# Patient Record
Sex: Female | Born: 1950 | Race: Black or African American | Hispanic: No | Marital: Single | Smoking: Former smoker
Health system: Southern US, Community
[De-identification: ages and names within clinical notes are randomized; demographics above are authoritative.]

## PROBLEM LIST (undated history)

## (undated) DIAGNOSIS — C541 Malignant neoplasm of endometrium: Secondary | ICD-10-CM

## (undated) DIAGNOSIS — C539 Malignant neoplasm of cervix uteri, unspecified: Secondary | ICD-10-CM

## (undated) DIAGNOSIS — Z8679 Personal history of other diseases of the circulatory system: Secondary | ICD-10-CM

## (undated) DIAGNOSIS — F32A Depression, unspecified: Secondary | ICD-10-CM

## (undated) DIAGNOSIS — I10 Essential (primary) hypertension: Secondary | ICD-10-CM

## (undated) DIAGNOSIS — Z9289 Personal history of other medical treatment: Secondary | ICD-10-CM

## (undated) DIAGNOSIS — E785 Hyperlipidemia, unspecified: Secondary | ICD-10-CM

## (undated) DIAGNOSIS — M1990 Unspecified osteoarthritis, unspecified site: Secondary | ICD-10-CM

## (undated) DIAGNOSIS — R569 Unspecified convulsions: Secondary | ICD-10-CM

## (undated) DIAGNOSIS — E119 Type 2 diabetes mellitus without complications: Secondary | ICD-10-CM

## (undated) DIAGNOSIS — F329 Major depressive disorder, single episode, unspecified: Secondary | ICD-10-CM

## (undated) DIAGNOSIS — K219 Gastro-esophageal reflux disease without esophagitis: Secondary | ICD-10-CM

## (undated) DIAGNOSIS — R011 Cardiac murmur, unspecified: Secondary | ICD-10-CM

## (undated) DIAGNOSIS — D649 Anemia, unspecified: Secondary | ICD-10-CM

## (undated) DIAGNOSIS — Z9889 Other specified postprocedural states: Secondary | ICD-10-CM

## (undated) DIAGNOSIS — Z87898 Personal history of other specified conditions: Secondary | ICD-10-CM

## (undated) DIAGNOSIS — Z923 Personal history of irradiation: Secondary | ICD-10-CM

## (undated) DIAGNOSIS — C181 Malignant neoplasm of appendix: Secondary | ICD-10-CM

## (undated) DIAGNOSIS — N950 Postmenopausal bleeding: Secondary | ICD-10-CM

## (undated) DIAGNOSIS — R911 Solitary pulmonary nodule: Secondary | ICD-10-CM

## (undated) HISTORY — DX: Essential (primary) hypertension: I10

## (undated) HISTORY — DX: Personal history of irradiation: Z92.3

## (undated) HISTORY — DX: Depression, unspecified: F32.A

## (undated) HISTORY — DX: Major depressive disorder, single episode, unspecified: F32.9

## (undated) HISTORY — PX: COLONOSCOPY: SHX174

## (undated) HISTORY — DX: Malignant neoplasm of appendix: C18.1

## (undated) HISTORY — DX: Solitary pulmonary nodule: R91.1

## (undated) HISTORY — DX: Malignant neoplasm of cervix uteri, unspecified: C53.9

---

## 1995-12-05 HISTORY — PX: CRANIOTOMY POSTERIOR FOSSA: SUR344

## 2015-05-05 HISTORY — PX: OTHER SURGICAL HISTORY: SHX169

## 2015-05-24 ENCOUNTER — Ambulatory Visit: Payer: Medicare Other | Attending: Gynecologic Oncology | Admitting: Gynecologic Oncology

## 2015-05-24 ENCOUNTER — Encounter: Payer: Self-pay | Admitting: Gynecologic Oncology

## 2015-05-24 VITALS — BP 148/79 | HR 53 | Temp 97.8°F | Resp 16 | Ht 68.0 in | Wt 203.4 lb

## 2015-05-24 DIAGNOSIS — C541 Malignant neoplasm of endometrium: Secondary | ICD-10-CM | POA: Diagnosis present

## 2015-05-24 DIAGNOSIS — C530 Malignant neoplasm of endocervix: Secondary | ICD-10-CM | POA: Diagnosis not present

## 2015-05-24 DIAGNOSIS — C539 Malignant neoplasm of cervix uteri, unspecified: Secondary | ICD-10-CM | POA: Insufficient documentation

## 2015-05-24 HISTORY — DX: Malignant neoplasm of cervix uteri, unspecified: C53.9

## 2015-05-24 NOTE — Progress Notes (Signed)
Consult Note: Gyn-Onc  Consult was requested by Dr. Adah Perl for the evaluation of Lori [name] 64 y.o. female  CC:  Chief Complaint  Patient presents with  . endometrial cancer    New consult    Assessment/Plan:  [name]  is a 64 y.o.  year old with clinical stage IIB endocervical adenocarcinoma. She has left parametrial infiltration on examination and an eroded cervix.   I am recommending primary chemoradiation with external beam radiation, brachytherapy and radiosensitizing cisplatin chemotherapy. The patient is interested in being treated in [place], and we will facilitate these appointments.  We will schedule her for PET/CT imaging to evaluate for distant metastatic disease which would alter treatment goals and choices.   HPI: Lori [name] is a 64 year old woman is seen in consultation at the request of Dr. Adah Perl for postmenopausal bleeding and adenocarcinoma.   The patient reports having postmenopausal bleeding sent March 2016. She was evaluated with a Pap smear in April 2016 which revealed ASC-H. she then presented for colposcopic evaluation at which time an abnormal cervix was visualized. An endometrial biopsy was taken and this revealed adenocarcinoma with special stains consistent with an endocervical primary (CEA and P 16 positivity). She underwent a CT scan of the abdomen and pelvis on 04/09/2015 and this revealed no lymphadenopathy, mild low attenuation thickening of the endometrium, no gross metastatic disease.   Current Meds:  Outpatient Encounter Prescriptions as of 05/24/2015  Medication Sig  . amLODipine (NORVASC) 10 MG tablet Take 10 mg by mouth daily.   Marland Kitchen aspirin 81 MG tablet Take 81 mg by mouth daily.  . benazepril (LOTENSIN) 40 MG tablet Take 40 mg by mouth daily.   . carbamazepine (TEGRETOL) 100 MG chewable tablet Chew 100 mg by mouth 3 (three) times daily.   . cloNIDine (CATAPRES) 0.2 MG tablet Take 0.2 mg by mouth 3 (three) times daily.    Marland Kitchen gabapentin (NEURONTIN) 300 MG capsule Take 300 mg by mouth 2 (two) times daily.   . hydrochlorothiazide (HYDRODIURIL) 25 MG tablet Take 25 mg by mouth daily.   . metoprolol (LOPRESSOR) 50 MG tablet 1.5 tablets by mouth in the morning, 1 tablet in the evening  . potassium chloride SA (K-DUR,KLOR-CON) 20 MEQ tablet Take 20 mEq by mouth daily.   . pravastatin (PRAVACHOL) 20 MG tablet Take 20 mg by mouth daily.   Marland Kitchen ibuprofen (ADVIL,MOTRIN) 400 MG tablet    No facility-administered encounter medications on file as of 05/24/2015.    Allergy:  Allergies  Allergen Reactions  . Contrast Media [Iodinated Diagnostic Agents] Itching and Swelling    All over  . Dilantin [Phenytoin Sodium Extended] Itching and Swelling    Whole body  . Latex Hives and Itching    Social Hx:   History   Social History  . Marital Status: Single    Spouse Name: N/A  . Number of Children: N/A  . Years of Education: N/A   Occupational History  . Not on file.   Social History Main Topics  . Smoking status: Former Smoker    Quit date: 12/05/1995  . Smokeless tobacco: Not on file  . Alcohol Use: No  . Drug Use: No  . Sexual Activity: Not on file   Other Topics Concern  . Not on file   Social History Narrative  . No narrative on file    Past Surgical Hx:  Past Surgical History  Procedure Laterality Date  . Brain surgery  1997    for aneurysum  Past Medical Hx:  Past Medical History  Diagnosis Date  . Depression   . Diabetes mellitus without complication   . Hypertension   . Elevated cholesterol     Past Gynecological History:  SVD x 3  No LMP recorded.  Family Hx:  Family History  Problem Relation Age of Onset  . Throat cancer Father   . Cervical cancer Sister     Review of Systems:  Constitutional  Feels well,    ENT Normal appearing ears and nares bilaterally Skin/Breast  No rash, sores, jaundice, itching, dryness Cardiovascular  No chest pain, shortness of breath, or  edema  Pulmonary  No cough or wheeze.  Gastro Intestinal  No nausea, vomitting, or diarrhoea. No bright red blood per rectum, no abdominal pain, change in bowel movement, or constipation.  Genito Urinary  No frequency, urgency, dysuria, see HPI Musculo Skeletal  No myalgia, arthralgia, joint swelling or pain  Neurologic  No weakness, numbness, change in gait,  Psychology  No depression, anxiety, insomnia.   Vitals:  Blood pressure 148/79, pulse 53, temperature 97.8 F (36.6 C), temperature source Oral, resp. rate 16, height 5\' 8"  (1.727 m), weight 203 lb 6.4 oz (92.262 kg).  Physical Exam: WD in NAD Neck  Supple NROM, without any enlargements.  Lymph Node Survey No cervical supraclavicular or inguinal adenopathy Cardiovascular  Pulse normal rate, regularity and rhythm. S1 and S2 normal.  Lungs  Clear to auscultation bilateraly, without wheezes/crackles/rhonchi. Good air movement.  Skin  No rash/lesions/breakdown  Psychiatry  Alert and oriented to person, place, and time  Abdomen  Normoactive bowel sounds, abdomen soft, non-tender and overweight without evidence of hernia.  Back No CVA tenderness Genito Urinary  Vulva/vagina: Normal external female genitalia.  No lesions. No discharge or bleeding.  Bladder/urethra:  No lesions or masses, well supported bladder  Vagina: Grossly normal with crater at apex in place of cervix  Cervix: No normal cervical tissue remaining. A crater replaces the cervix. It contains a base of white, firm, fleshy and friable tissue. On rectovaginal exam there is left parametrial infiltration.  Uterus: Small, mobile, no parametrial involvement or nodularity.  Adnexa: no discrete masses. Rectal  Good tone, no masses no cul de sac nodularity.  Extremities  No bilateral cyanosis, clubbing or edema.   Lori Eva, MD   05/24/2015, 3:38 PM

## 2015-05-24 NOTE — Patient Instructions (Addendum)
Plan for radiation with weekly Cisplatin at [facility] in [place]. We are going to send a referral and you will receive a phone call with these appointments.   PET scan scheduled for 06/01/15 at [facility]. If you have any additional questions or concerns, please call our office.

## 2015-06-01 ENCOUNTER — Telehealth: Payer: Self-pay | Admitting: *Deleted

## 2015-06-01 ENCOUNTER — Ambulatory Visit (HOSPITAL_COMMUNITY)
Admission: RE | Admit: 2015-06-01 | Discharge: 2015-06-01 | Disposition: A | Discharge status: Home / Self Care | Payer: Medicare Other | Source: Ambulatory Visit | Attending: Gynecologic Oncology | Admitting: Gynecologic Oncology

## 2015-06-01 DIAGNOSIS — E119 Type 2 diabetes mellitus without complications: Secondary | ICD-10-CM | POA: Insufficient documentation

## 2015-06-01 DIAGNOSIS — I10 Essential (primary) hypertension: Secondary | ICD-10-CM | POA: Diagnosis not present

## 2015-06-01 DIAGNOSIS — R933 Abnormal findings on diagnostic imaging of other parts of digestive tract: Secondary | ICD-10-CM | POA: Diagnosis not present

## 2015-06-01 DIAGNOSIS — C541 Malignant neoplasm of endometrium: Secondary | ICD-10-CM | POA: Diagnosis present

## 2015-06-01 DIAGNOSIS — C775 Secondary and unspecified malignant neoplasm of intrapelvic lymph nodes: Secondary | ICD-10-CM | POA: Diagnosis not present

## 2015-06-01 LAB — GLUCOSE, CAPILLARY: Glucose-Capillary: 105 mg/dL — ABNORMAL HIGH (ref 65–99)

## 2015-06-01 MED ORDER — FLUDEOXYGLUCOSE F - 18 (FDG) INJECTION
10.9000 | Freq: Once | INTRAVENOUS | Status: AC | PRN
  Administered 2015-06-01: 10.9 via INTRAVENOUS

## 2015-06-01 NOTE — Telephone Encounter (Signed)
Call received from [facility] at [place] stating  the  patient has a scheduled appointment on June 14, 2015 @ 3:30 with Dr. Lexine Baton.

## 2015-07-21 ENCOUNTER — Other Ambulatory Visit: Payer: Self-pay | Admitting: Radiation Oncology

## 2015-07-21 DIAGNOSIS — C539 Malignant neoplasm of cervix uteri, unspecified: Secondary | ICD-10-CM

## 2015-07-22 ENCOUNTER — Encounter (HOSPITAL_BASED_OUTPATIENT_CLINIC_OR_DEPARTMENT_OTHER): Payer: Self-pay | Admitting: *Deleted

## 2015-07-23 ENCOUNTER — Telehealth: Payer: Self-pay | Admitting: *Deleted

## 2015-07-23 NOTE — Telephone Encounter (Signed)
CALLED PATIENT TO INFORM OF TANDEM RING PLACEMENT ON 07-28-15, LVM FOR A RETURN CALL

## 2015-07-27 ENCOUNTER — Encounter (HOSPITAL_BASED_OUTPATIENT_CLINIC_OR_DEPARTMENT_OTHER): Payer: Self-pay | Admitting: *Deleted

## 2015-07-27 NOTE — H&P (Signed)
Radiation Oncology         [phone] ________________________________  History and physical examination note  Name: Lori [name] MRN: [id number]  Date: 07/21/2015  DOB: 1951-03-23  AS:TMHD,QQIWLN, MD  No ref. provider found   REFERRING PHYSICIAN: Everitt Amber, MD  DIAGNOSIS: clinical stage IIB endocervical adenocarcinoma.  HISTORY OF PRESENT ILLNESS::Lori [name] is a 64 y.o. female who is  diagnosed with clinical stage II-B endocervical adenocarcinoma. She has been found to have left parametrial filtration on examination and an eroded cervix. Patient is been receiving radiosensitizing chemotherapy along with pelvic radiation therapy in the [place]. Patient has completed approximately 25 treatments as of yesterday. She will be taken to the operating room on August 24 for exam under anesthesia and placement of cervical sleeve under the direction of Dr. Denman George as well as placement of tandem and ring for high-dose rate radiation therapy treatments.   PAST MEDICAL HISTORY:  has a past medical history of Depression; Hypertension; Hyperlipidemia; History of cerebral aneurysm repair; PMB (postmenopausal bleeding); History of seizures; Type 2 diabetes, diet controlled; GERD (gastroesophageal reflux disease); Arthritis; Endometrial carcinoma; and Cervical carcinoma (oncologist--  dr Sondra Come for radiation/   [facility] for chemotherapy).    PAST SURGICAL HISTORY: Past Surgical History  Procedure Laterality Date  . Craniotomy posterior fossa  1997    Repair aneurysm  x2  left side  . Port-a-cath placement  06/  2016    FAMILY HISTORY: family history includes Cervical cancer in her sister; Throat cancer in her father.  SOCIAL HISTORY:  reports that she quit smoking about 19 years ago. Her smoking use included Cigarettes. She quit after 5 years of use. She has never used smokeless tobacco. She reports that she does not drink alcohol or use illicit drugs.  ALLERGIES:  Contrast media; Dilantin; Latex; and Adhesive  MEDICATIONS:  No current facility-administered medications for this encounter.   Current Outpatient Prescriptions  Medication Sig Dispense Refill  . amLODipine (NORVASC) 10 MG tablet Take 10 mg by mouth every morning.   5  . aspirin 81 MG tablet Take 81 mg by mouth daily.    . benazepril (LOTENSIN) 40 MG tablet Take 40 mg by mouth every morning.   5  . carbamazepine (TEGRETOL) 100 MG chewable tablet Chew 100 mg by mouth 3 (three) times daily.   5  . cloNIDine (CATAPRES) 0.2 MG tablet Take 0.2 mg by mouth 3 (three) times daily.   6  . gabapentin (NEURONTIN) 300 MG capsule Take 300 mg by mouth 2 (two) times daily.   5  . hydrochlorothiazide (HYDRODIURIL) 25 MG tablet Take 25 mg by mouth every morning.   11  . ibuprofen (ADVIL,MOTRIN) 400 MG tablet Take 400 mg by mouth every 6 (six) hours as needed.   0  . loperamide (IMODIUM A-D) 2 MG tablet Take 2 mg by mouth 4 (four) times daily as needed for diarrhea or loose stools.    . metoprolol (LOPRESSOR) 50 MG tablet Take by mouth 2 (two) times daily. 1.5 tablets by mouth in the morning, 1 tablet in the evening  5  . potassium chloride SA (K-DUR,KLOR-CON) 20 MEQ tablet Take 20 mEq by mouth 2 (two) times daily.   6  . pravastatin (PRAVACHOL) 20 MG tablet Take 20 mg by mouth every evening.   2    REVIEW OF SYSTEMS:  A 15 point review of systems is documented in the electronic medical record. This was obtained by the nursing staff. However, I  reviewed this with the patient to discuss relevant findings and make appropriate changes.  Patient has had problems with diarrhea with her external beam treatments. This is managed well with Imodium she denies any nausea. Has some vaginal discharge but no active bleeding at this time.   PHYSICAL EXAM:  height is 5\' 8"  (1.727 m) and weight is 197 lb (89.359 kg).  this is a very pleasant 64 year old female in no acute distress. She has left-sided weakness from prior  complications related to cerebral aneurysm vital signs at the [facility] on August 22 showed a temperature of 98.1 pulse of 76 respirations 18 blood pressure 141/68 oxygen saturation 98% on room air. Examination of the oral cavity reveals no secondary infection. No palpable adenopathy in the neck or supraclavicular region. The lungs are clear to auscultation. The heart has a regular rhythm and rate. The abdomen is soft and nontender with normal bowel sounds. Pelvic exam is deferred until intraoperative procedure. Extremities show good peripheral pulses. Mild edema and ankle and foot areas. The left-sided weakness. The patient ambulates with the assistance of a cane.    LABORATORY DATA:  To be scanned in from [facility],  August 16 white count 6.4 hemoglobin 10.0 hematocrit 29.7 platelet count 148,000 and absolute neutrophil count 4.9, glucose 120 BUN 13 creatinine 0.77 calcium 9.0 magnesium 1.5      IMPRESSION: Stage II-B endocervical adenocarcinoma. Patient will proceed with brachytherapy treatments using iridium 192 as the high-dose-rate source. Operative procedure scheduled for August 24 for placement of tandem ring in preparation for high-dose rate radiation therapy.    ------------------------------------------------  Blair Promise, PhD, MD

## 2015-07-27 NOTE — Progress Notes (Signed)
NPO AFTER MN.  ARRIVE AT 0700.  NEEDS EKG.  CURRENT LAB RESULTS IN CHART.  WILL TAKE AM MEDS W/ EXCEPTION NO HCTZ/ K-DUR, DOS W/ SIPS WATER.

## 2015-07-28 ENCOUNTER — Ambulatory Visit (HOSPITAL_COMMUNITY)
Admission: RE | Admit: 2015-07-28 | Discharge: 2015-07-28 | Disposition: A | Discharge status: Home / Self Care | Payer: Medicare Other | Source: Ambulatory Visit | Attending: Radiation Oncology | Admitting: Radiation Oncology

## 2015-07-28 ENCOUNTER — Encounter (HOSPITAL_BASED_OUTPATIENT_CLINIC_OR_DEPARTMENT_OTHER): Payer: Self-pay | Admitting: *Deleted

## 2015-07-28 ENCOUNTER — Ambulatory Visit
Admission: RE | Admit: 2015-07-28 | Discharge: 2015-07-28 | Disposition: A | Discharge status: Home / Self Care | Payer: Medicare Other | Source: Ambulatory Visit | Attending: Radiation Oncology | Admitting: Radiation Oncology

## 2015-07-28 ENCOUNTER — Encounter: Payer: Self-pay | Admitting: Radiation Oncology

## 2015-07-28 ENCOUNTER — Encounter (HOSPITAL_BASED_OUTPATIENT_CLINIC_OR_DEPARTMENT_OTHER)
Admission: RE | Disposition: A | Discharge status: Home / Self Care | Payer: Self-pay | Source: Ambulatory Visit | Attending: Radiation Oncology

## 2015-07-28 ENCOUNTER — Ambulatory Visit (HOSPITAL_BASED_OUTPATIENT_CLINIC_OR_DEPARTMENT_OTHER): Payer: Medicare Other | Admitting: Anesthesiology

## 2015-07-28 ENCOUNTER — Ambulatory Visit (HOSPITAL_BASED_OUTPATIENT_CLINIC_OR_DEPARTMENT_OTHER)
Admission: RE | Admit: 2015-07-28 | Discharge: 2015-07-28 | Disposition: A | Discharge status: Home / Self Care | Payer: Medicare Other | Source: Ambulatory Visit | Attending: Radiation Oncology | Admitting: Radiation Oncology

## 2015-07-28 VITALS — BP 117/70 | HR 65 | Temp 98.0°F | Resp 16

## 2015-07-28 DIAGNOSIS — Z510 Encounter for antineoplastic radiation therapy: Secondary | ICD-10-CM | POA: Diagnosis present

## 2015-07-28 DIAGNOSIS — E785 Hyperlipidemia, unspecified: Secondary | ICD-10-CM | POA: Insufficient documentation

## 2015-07-28 DIAGNOSIS — Z888 Allergy status to other drugs, medicaments and biological substances status: Secondary | ICD-10-CM | POA: Diagnosis not present

## 2015-07-28 DIAGNOSIS — Z8049 Family history of malignant neoplasm of other genital organs: Secondary | ICD-10-CM | POA: Insufficient documentation

## 2015-07-28 DIAGNOSIS — M1990 Unspecified osteoarthritis, unspecified site: Secondary | ICD-10-CM | POA: Insufficient documentation

## 2015-07-28 DIAGNOSIS — C539 Malignant neoplasm of cervix uteri, unspecified: Secondary | ICD-10-CM | POA: Diagnosis not present

## 2015-07-28 DIAGNOSIS — C530 Malignant neoplasm of endocervix: Secondary | ICD-10-CM | POA: Diagnosis not present

## 2015-07-28 DIAGNOSIS — K219 Gastro-esophageal reflux disease without esophagitis: Secondary | ICD-10-CM | POA: Diagnosis not present

## 2015-07-28 DIAGNOSIS — R531 Weakness: Secondary | ICD-10-CM | POA: Diagnosis not present

## 2015-07-28 DIAGNOSIS — Z91041 Radiographic dye allergy status: Secondary | ICD-10-CM | POA: Diagnosis not present

## 2015-07-28 DIAGNOSIS — Z91040 Latex allergy status: Secondary | ICD-10-CM | POA: Insufficient documentation

## 2015-07-28 DIAGNOSIS — Z79899 Other long term (current) drug therapy: Secondary | ICD-10-CM | POA: Diagnosis not present

## 2015-07-28 DIAGNOSIS — F329 Major depressive disorder, single episode, unspecified: Secondary | ICD-10-CM | POA: Diagnosis not present

## 2015-07-28 DIAGNOSIS — E119 Type 2 diabetes mellitus without complications: Secondary | ICD-10-CM | POA: Insufficient documentation

## 2015-07-28 DIAGNOSIS — I10 Essential (primary) hypertension: Secondary | ICD-10-CM | POA: Diagnosis not present

## 2015-07-28 DIAGNOSIS — Z87891 Personal history of nicotine dependence: Secondary | ICD-10-CM | POA: Diagnosis not present

## 2015-07-28 HISTORY — PX: TANDEM RING INSERTION: SHX6199

## 2015-07-28 HISTORY — DX: Hyperlipidemia, unspecified: E78.5

## 2015-07-28 HISTORY — DX: Malignant neoplasm of cervix uteri, unspecified: C53.9

## 2015-07-28 HISTORY — DX: Personal history of other specified conditions: Z87.898

## 2015-07-28 HISTORY — DX: Unspecified osteoarthritis, unspecified site: M19.90

## 2015-07-28 HISTORY — DX: Personal history of other diseases of the circulatory system: Z86.79

## 2015-07-28 HISTORY — DX: Malignant neoplasm of endometrium: C54.1

## 2015-07-28 HISTORY — DX: Postmenopausal bleeding: N95.0

## 2015-07-28 HISTORY — DX: Other specified postprocedural states: Z98.890

## 2015-07-28 HISTORY — DX: Gastro-esophageal reflux disease without esophagitis: K21.9

## 2015-07-28 HISTORY — DX: Type 2 diabetes mellitus without complications: E11.9

## 2015-07-28 LAB — GLUCOSE, CAPILLARY: GLUCOSE-CAPILLARY: 140 mg/dL — AB (ref 65–99)

## 2015-07-28 SURGERY — INSERTION, UTERINE TANDEM AND RING OR CYLINDER, FOR BRACHYTHERAPY
Anesthesia: General | Site: Vagina

## 2015-07-28 MED ORDER — FENTANYL CITRATE (PF) 100 MCG/2ML IJ SOLN
INTRAMUSCULAR | Status: AC
  Filled 2015-07-28: qty 4

## 2015-07-28 MED ORDER — ACETAMINOPHEN 10 MG/ML IV SOLN
INTRAVENOUS | Status: DC | PRN
  Administered 2015-07-28: 1000 mg via INTRAVENOUS

## 2015-07-28 MED ORDER — LACTATED RINGERS IV SOLN
INTRAVENOUS | Status: DC
  Administered 2015-07-28 (×2): via INTRAVENOUS
  Filled 2015-07-28: qty 1000

## 2015-07-28 MED ORDER — PROMETHAZINE HCL 25 MG/ML IJ SOLN
6.2500 mg | INTRAMUSCULAR | Status: DC | PRN
  Filled 2015-07-28: qty 1

## 2015-07-28 MED ORDER — EPHEDRINE SULFATE 50 MG/ML IJ SOLN
INTRAMUSCULAR | Status: DC | PRN
  Administered 2015-07-28: 10 mg via INTRAVENOUS

## 2015-07-28 MED ORDER — MEPERIDINE HCL 25 MG/ML IJ SOLN
6.2500 mg | INTRAMUSCULAR | Status: DC | PRN
  Filled 2015-07-28: qty 1

## 2015-07-28 MED ORDER — LIDOCAINE HCL (CARDIAC) 20 MG/ML IV SOLN
INTRAVENOUS | Status: DC | PRN
  Administered 2015-07-28: 60 mg via INTRAVENOUS

## 2015-07-28 MED ORDER — LACTATED RINGERS IV SOLN
INTRAVENOUS | Status: DC
  Filled 2015-07-28: qty 1000

## 2015-07-28 MED ORDER — KETOROLAC TROMETHAMINE 30 MG/ML IJ SOLN
INTRAMUSCULAR | Status: DC | PRN
  Administered 2015-07-28: 30 mg via INTRAVENOUS

## 2015-07-28 MED ORDER — WATER FOR IRRIGATION, STERILE IR SOLN
Status: DC | PRN
  Administered 2015-07-28: 3000 mL via SURGICAL_CAVITY
  Administered 2015-07-28: 500 mL via SURGICAL_CAVITY

## 2015-07-28 MED ORDER — FENTANYL CITRATE (PF) 100 MCG/2ML IJ SOLN
INTRAMUSCULAR | Status: DC | PRN
  Administered 2015-07-28: 25 ug via INTRAVENOUS
  Administered 2015-07-28: 50 ug via INTRAVENOUS

## 2015-07-28 MED ORDER — ONDANSETRON HCL 4 MG/2ML IJ SOLN
INTRAMUSCULAR | Status: DC | PRN
  Administered 2015-07-28: 4 mg via INTRAVENOUS

## 2015-07-28 MED ORDER — FENTANYL CITRATE (PF) 100 MCG/2ML IJ SOLN
25.0000 ug | INTRAMUSCULAR | Status: DC | PRN
  Administered 2015-07-28: 50 ug via INTRAVENOUS
  Filled 2015-07-28: qty 1

## 2015-07-28 MED ORDER — DEXAMETHASONE SODIUM PHOSPHATE 4 MG/ML IJ SOLN
INTRAMUSCULAR | Status: DC | PRN
  Administered 2015-07-28: 10 mg via INTRAVENOUS

## 2015-07-28 MED ORDER — PROPOFOL 10 MG/ML IV BOLUS
INTRAVENOUS | Status: DC | PRN
  Administered 2015-07-28: 150 mg via INTRAVENOUS

## 2015-07-28 MED ORDER — FENTANYL CITRATE (PF) 100 MCG/2ML IJ SOLN
INTRAMUSCULAR | Status: AC
  Filled 2015-07-28: qty 2

## 2015-07-28 MED ORDER — ESTRADIOL 0.1 MG/GM VA CREA
TOPICAL_CREAM | VAGINAL | Status: DC | PRN
  Administered 2015-07-28: 1 via VAGINAL

## 2015-07-28 MED ORDER — MIDAZOLAM HCL 5 MG/5ML IJ SOLN
INTRAMUSCULAR | Status: DC | PRN
  Administered 2015-07-28: 2 mg via INTRAVENOUS

## 2015-07-28 MED ORDER — MIDAZOLAM HCL 2 MG/2ML IJ SOLN
INTRAMUSCULAR | Status: AC
  Filled 2015-07-28: qty 2

## 2015-07-28 SURGICAL SUPPLY — 36 items
BAG URINE DRAINAGE (UROLOGICAL SUPPLIES) ×3 IMPLANT
BNDG CONFORM 2 STRL LF (GAUZE/BANDAGES/DRESSINGS) IMPLANT
CATH FOLEY 2WAY SLVR  5CC 16FR (CATHETERS)
CATH FOLEY 2WAY SLVR 5CC 16FR (CATHETERS) IMPLANT
CATH SILICONE 16FRX5CC (CATHETERS) ×3 IMPLANT
CLOTH BEACON ORANGE TIMEOUT ST (SAFETY) ×3 IMPLANT
COVER BACK TABLE 60X90IN (DRAPES) ×3 IMPLANT
DRAPE LG THREE QUARTER DISP (DRAPES) ×3 IMPLANT
DRAPE UNDERBUTTOCKS STRL (DRAPE) ×3 IMPLANT
GLOVE BIO SURGEON STRL SZ7.5 (GLOVE) ×6 IMPLANT
GOWN STRL REUS W/ TWL LRG LVL3 (GOWN DISPOSABLE) ×2 IMPLANT
GOWN STRL REUS W/TWL LRG LVL3 (GOWN DISPOSABLE) ×4
HOLDER FOLEY CATH W/STRAP (MISCELLANEOUS) ×3 IMPLANT
LATEX FREE FOLEY ×3 IMPLANT
LEGGING LITHOTOMY PAIR STRL (DRAPES) ×3 IMPLANT
NEEDLE SPNL 22GX3.5 QUINCKE BK (NEEDLE) IMPLANT
PACK BASIN DAY SURGERY FS (CUSTOM PROCEDURE TRAY) ×3 IMPLANT
PACKING VAGINAL (PACKING) IMPLANT
PAD ABD 8X10 STRL (GAUZE/BANDAGES/DRESSINGS) ×3 IMPLANT
PAD OB MATERNITY 4.3X12.25 (PERSONAL CARE ITEMS) ×3 IMPLANT
PAD PREP 24X48 CUFFED NSTRL (MISCELLANEOUS) ×3 IMPLANT
PLUG CATH AND CAP STER (CATHETERS) ×3 IMPLANT
SET IRRIG Y TYPE TUR BLADDER L (SET/KITS/TRAYS/PACK) ×3 IMPLANT
SUT PROLENE 0 CT 2 (SUTURE) ×6 IMPLANT
SUT PROLENE 0 SH 30 (SUTURE) IMPLANT
SUT SILK 2 0 30  PSL (SUTURE)
SUT SILK 2 0 30 PSL (SUTURE) IMPLANT
SUT VIC AB 0 CT1 36 (SUTURE) ×3 IMPLANT
SUTURE ×2 IMPLANT
SYR BULB IRRIGATION 50ML (SYRINGE) IMPLANT
SYR CONTROL 10ML LL (SYRINGE) IMPLANT
SYRINGE 10CC LL (SYRINGE) ×3 IMPLANT
TOWEL OR 17X24 6PK STRL BLUE (TOWEL DISPOSABLE) ×6 IMPLANT
TRAY DSU PREP LF (CUSTOM PROCEDURE TRAY) ×3 IMPLANT
WATER STERILE IRR 3000ML UROMA (IV SOLUTION) ×3 IMPLANT
WATER STERILE IRR 500ML POUR (IV SOLUTION) ×3 IMPLANT

## 2015-07-28 NOTE — Progress Notes (Signed)
  Radiation Oncology         [phone] ________________________________  Name: Lori [name] MRN: [id number]  Date: 07/28/2015  DOB: [birth year]  SIMULATION AND TREATMENT PLANNING NOTE HDR BRACHYTHERAPY  DIAGNOSIS:  clinical stage IIB endocervical adenocarcinoma.  NARRATIVE:  The patient was brought to the [facility] [place].  Identity was confirmed.  All relevant records and images related to the planned course of therapy were reviewed.  The patient freely provided informed written consent to proceed with treatment after reviewing the details related to the planned course of therapy. The consent form was witnessed and verified by the simulation staff.  Then, the patient was set-up in a stable reproducible  supine position for radiation therapy.  CT images were obtained.  Surface markings were placed.  The CT images were loaded into the planning software.  Then the target and avoidance structures were contoured.  Treatment planning then occurred.  The radiation prescription was entered and confirmed.   I have requested : Brachytherapy Isodose Plan and Dosimetry Calculations to plan the radiation distribution.    PLAN:  The patient will receive 5.5 Gy in 1 fraction. She will receive 4 additional high-dose rate treatments at a later date.    ________________________________  Blair Promise, PhD, MD

## 2015-07-28 NOTE — Anesthesia Preprocedure Evaluation (Addendum)
Anesthesia Evaluation  Patient identified by MRN, date of birth, ID band Patient awake    Reviewed: Allergy & Precautions, NPO status , Patient's Chart, lab work & pertinent test results  Airway Mallampati: II  TM Distance: >3 FB Neck ROM: Full    Dental no notable dental hx. (+) Poor Dentition, Missing   Pulmonary neg pulmonary ROS, former smoker,  breath sounds clear to auscultation  Pulmonary exam normal       Cardiovascular hypertension, Pt. on medications and Pt. 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 negative psych ROS   GI/Hepatic negative GI ROS, Neg liver ROS,   Endo/Other  diabetes  Renal/GU negative Renal ROS  negative genitourinary   Musculoskeletal negative musculoskeletal ROS (+)   Abdominal   Peds negative pediatric ROS (+)  Hematology negative hematology ROS (+)   Anesthesia Other Findings   Reproductive/Obstetrics negative OB ROS                            Anesthesia Physical Anesthesia Plan  ASA: III  Anesthesia Plan: General   Post-op Pain Management:    Induction: Intravenous  Airway Management Planned: LMA  Additional Equipment:   Intra-op Plan:   Post-operative Plan: Extubation in OR  Informed Consent: I have reviewed the patients History and Physical, chart, labs and discussed the procedure including the risks, benefits and alternatives for the proposed anesthesia with the patient or authorized representative who has indicated his/her understanding and acceptance.   Dental advisory given  Plan Discussed with: CRNA  Anesthesia Plan Comments:         Anesthesia Quick Evaluation

## 2015-07-28 NOTE — Progress Notes (Signed)
IMMEDIATELY FOLLOWING SURGERY: Do not drive or operate machinery for the first twenty four hours after surgery. Do not make any important decisions for twenty four hours after surgery or while taking narcotic pain medications or sedatives. If you develop intractable nausea and vomiting or a severe headache please notify your doctor immediately.   FOLLOW-UP: You do not need to follow up with anesthesia unless specifically instructed to do so.   WOUND CARE INSTRUCTIONS (if applicable): Expect some mild vaginal bleeding, but if large amount of bleeding occurs please contact Dr. Sondra Come at [phone] or the Radiation On-Call physician. Call for any fever greater than 101.0 degrees or increasing vaginal//abdominal pain or trouble urinating.   QUESTIONS?: Please feel free to call your physician or the hospital operator if you have any questions, and they will be happy to assist you. Resume all medications: as listed on your after visit summary. Your next appointment is:  Future Appointments Date Time Provider [place]  08/05/2015 7:00 AM [facility] PORT 1 [facility] [place]  08/05/2015 9:00 AM Gery Pray, MD [facility] None  08/05/2015 1:00 PM Gery Pray, MD [facility] None  08/11/2015 8:00 AM [facility] PORT 1 [facility] [place]  08/11/2015 10:00 AM Gery Pray, MD [facility] None  08/11/2015 2:00 PM Gery Pray, MD [facility] None  08/17/2015 7:00 AM [facility] PORT 1 [facility] [place]  08/17/2015 9:00 AM Gery Pray, MD [facility] None  08/17/2015 1:30 PM Gery Pray, MD [place] None  08/26/2015 7:00 AM [facility] PORT 1 [facility] [place]  08/26/2015 9:00 AM Gery Pray, MD [facility] None  08/26/2015 1:30 PM Gery Pray, MD [facility] None

## 2015-07-28 NOTE — Anesthesia Postprocedure Evaluation (Signed)
  Anesthesia Post-op Note  Patient: Lori [name]  Procedure(s) Performed: Procedure(s) (LRB): TANDEM RING PLACEMENT (N/A)  Patient Location: PACU  Anesthesia Type: General  Level of Consciousness: awake and alert   Airway and Oxygen Therapy: Patient Spontanous Breathing  Post-op Pain: mild  Post-op Assessment: Post-op Vital signs reviewed, Patient's Cardiovascular Status Stable, Respiratory Function Stable, Patent Airway and No signs of Nausea or vomiting  Last Vitals:  Filed Vitals:   07/28/15 1015  BP: 137/76  Pulse: 65  Temp:   Resp: 10    Post-op Vital Signs: stable   Complications: No apparent anesthesia complications

## 2015-07-28 NOTE — Progress Notes (Signed)
Patient and family given discharge instructions.  Removed foley catheter intact.  Assisted Dr. Sondra Come with tandem equipment removal.  Patient tolerated well.  Removed right forearm IV intact.  Pressure and dressing applied.  Assisted patient with dressing.  Transported her to the lobby and assisted her to get the vehicle with her family.

## 2015-07-28 NOTE — Interval H&P Note (Signed)
History and Physical Interval Note:  07/28/2015 8:23 AM  Lori [name]  has presented today for surgery, with the diagnosis of ENDOMETRIAL CANCER  The various methods of treatment have been discussed with the patient and family. After consideration of risks, benefits and other options for treatment, the patient has consented to  Procedure(s): TANDEM RING PLACEMENT (N/A) as a surgical intervention .  The patient's history has been reviewed, patient examined, no change in status, stable for surgery.  I have reviewed the patient's chart and labs.  Questions were answered to the patient's satisfaction.     Gery Pray

## 2015-07-28 NOTE — Op Note (Signed)
OPERATIVE NOTE 07/28/15  PATIENT: Lori [name] DATE: 07/28/15   Preop Diagnosis: stage IIB cervical cancer  Postoperative Diagnosis: same  Surgery: ultrasound guided dilation of cervix and placement of smitt sleeve  Surgeons:  Donaciano Eva, MD Assistant: Dr Gery Pray  Anesthesia: General   Estimated blood loss: 60m  IVF:  1036m  Urine output: 20035l   Complications: None   Pathology: none   Implants: smitt sleeve secured with 0-prolene x 2  Operative findings: cervix flush with vagina. Uterus sounded to 6cm. Sleeve within the uterine fundus.  Procedure: The patient was identified in the preoperative holding area. Informed consent was signed on the chart. Patient was seen history was reviewed and exam was performed.   The patient was then taken to the operating room and placed in the supine position with SCD hose on. General anesthesia was then induced without difficulty. She was then placed in the dorsolithotomy position. The perineum was prepped with chlorhexadine. The vagina was prepped with chlorhexadine. The patient was then draped after the prep was dried. An in and out catheterization to empty the bladder was performed under sterile conditions .  Timeout was performed the patient, procedure, antibiotic, allergy, and length of procedure.   The graves speculum was placed in the vagina. The cervical os was visualized. Using ultrasound guidance the sound was inserted to 6cm where it met the fundus. The uterus deviated to the patient's right. Pratts dilators were used to progressively dilate the cervix to accomodate a smitt's sleeve. A 4cm sleeve was selected by Dr KiSondra ComeIt was placed under [place]idance to ensure intrauterine cavity placement. It was secured to the anterior and posterior cervix with 0-prolene sutures x 2.  Hemostasis was noted.  [place] again used to confirm intrauterine placement.  All instrument, suture, laparotomy, Ray-Tec, and  needle counts were correct x2. The patient tolerated the procedure well and was taken recovery room in stable condition. The procedure continued on with Dr KiSondra Comes the primary surgeon. This is EmEveritt Amberictating an operative note on [employer] RoDonaciano EvaMD

## 2015-07-28 NOTE — Progress Notes (Signed)
  Radiation Oncology         [phone] ________________________________  Name: Lori [name] MRN: [id number]  Date: 07/28/2015  DOB: [birth year]   HDR BRACHYTHERAPY  DIAGNOSIS:  clinical stage IIB endocervical adenocarcinoma.  NARRATIVE: After planning was complete the patient was transferred to the high-dose-rate suite. She was placed in the treatment position. A fiducial marker was placed within the tandem ring system for imaging purposes.  Verification simulation note:  An AP and lateral film was obtained in the treatment position. This verified good position of the tandem/ring for treatment.  HDR brachytherapy treatment:  The remote afterloader was attached to the tandem/ring system by catheter system. The patient then proceeded to undergo her first high-dose-rate treatment directed at the cervical area. The patient was treated with iridium 192 as the high-dose-rate source. Patient was prescribed a dose of 5.5 gray to be delivered to the high risk CTV. This was achieved using 2 channels, that being the ring channel and the tandem channel. Total treatment time was 276.3 seconds.  Patient tolerated her treatment well. After completion of her radiation therapy a survey was performed documenting return of the iridium source into the gamma med safe.  PLAN:  Patient will return next week for her second high-dose-rate treatment. In the meantime the patient will continue her external beam treatments at the [facility] in [place]. She will begin her left sidewall boost later this week or early next week.    ________________________________  Blair Promise, PhD, MD

## 2015-07-28 NOTE — Progress Notes (Signed)
07/28/2015  12:35 PM  PATIENT:  Lori [name]  64 y.o. female  PRE-OPERATIVE DIAGNOSIS:  stage IIB cervical cancer  POST-OPERATIVE DIAGNOSIS:  stage IIB cervical cancer  PROCEDURE:  Procedure(s): TANDEM RING PLACEMENT (N/A)  SURGEON:  Surgeon(s) and Role:    * Gery Pray, MD - Primary    * Everitt Amber, MD - Assisting  PHYSICIAN ASSISTANT:   ASSISTANTS: Everitt Amber, MD   ANESTHESIA:   general  EBL:  Total I/O In: 1100 [I.V.:1100] Out: 300 [Urine:300]  BLOOD ADMINISTERED:none  DRAINS: Urinary Catheter (Foley)   LOCAL MEDICATIONS USED:  NONE  SPECIMEN:  No Specimen  DISPOSITION OF SPECIMEN:  N/A  COUNTS:  YES  TOURNIQUET:  * No tourniquets in log *  DICTATION: Timeout was performed the patient, procedure, antibiotic, allergy, and length of procedure.The patient was identified in the preoperative holding area. Informed consent was signed on the chart. Patient was seen history was reviewed and exam was performed. The patient was then taken to the operating room and placed in the supine position with SCD hose on. General anesthesia was then induced without difficulty. She was then placed in the dorsolithotomy position. The perineum was prepped with chlorhexadine. The vagina was prepped with chlorhexadine. The patient was then draped after the prep was dried. An in and out catheterization to empty the bladder was performed under sterile conditions . A latex free Foley catheter was then placed. Exam under anesthesia revealed the cervix was noted to be flush with the vagina. Some thickening in the left parametrial area but no obvious involvement in this region. The cervix was noted to be normal in size. Patient proceeded to undergo uterine sounding by Dr. Denman George. The uterus sounded to 6 cm. The patient then had dilation of the cervical os. A 40 mm cervical sleeve was sewn into the Cervical Region by Dr. Denman George. This Was Secured with 2- 0 Prolene Sutures. Intraoperative Ultrasound  Confirmed a Accurate Placement of the cervical sleeve. Patient then had placement of a 40 mm, 45 high-dose-rate tandem within the uterine cavity and cervical sleeve. The patient then had placement of a 45 ring with small shielding cap in place. This was affixed to the high-dose-rate tandem. Patient then had Estrace cream placed in the vaginal vault  and placement of a rectal paddle. The rectal paddle was placed and secured to the tandem/ring apparatus. The position of the tandem and ring was verified with intraoperative ultrasound. Patient tolerated the procedure well  PLAN OF CARE: Transferred to radiation oncology for planning and treatment  PATIENT DISPOSITION:  PACU - hemodynamically stable.   Delay start of Pharmacological VTE agent (>24hrs) due to surgical blood loss or risk of bleeding: not applicable

## 2015-07-28 NOTE — Progress Notes (Signed)
Received report from Ivin Booty, [place] in PACU.  Patient has foley catheter draining clear, yellow urine.  She has a #20 gauge IV in her right wrist with LR infusing to gravity.  Transported her to CT SIM with Judie Grieve, RN.

## 2015-07-28 NOTE — Progress Notes (Signed)
Patient back from CT SIM.  Changed IV tubing to pump tubing.  LR infusing at 125 ml/hr.  Sequential compression boots plugged in and functioning.  Patient denies having any pain.  Call light in reach.  Will continue to monitor.

## 2015-07-28 NOTE — Transfer of Care (Signed)
Last Vitals:  Filed Vitals:   07/28/15 0718  BP: 139/73  Pulse: 63  Temp: 36.7 C  Resp: 16    Immediate Anesthesia Transfer of Care Note  Patient: Lori [name]  Procedure(s) Performed: Procedure(s) (LRB): TANDEM RING PLACEMENT (N/A)  Patient Location: PACU  Anesthesia Type: General  Level of Consciousness: awake, alert  and oriented  Airway & Oxygen Therapy: Patient Spontanous Breathing and Patient connected to face mask oxygen  Post-op Assessment: Report given to PACU RN and Post -op Vital signs reviewed and stable  Post vital signs: Reviewed and stable  Complications: No apparent anesthesia complications

## 2015-07-28 NOTE — Anesthesia Procedure Notes (Signed)
Procedure Name: LMA Insertion Date/Time: 07/28/2015 8:34 AM Performed by: Mechele Claude Pre-anesthesia Checklist: Patient identified, Emergency Drugs available, Suction available and Patient being monitored Patient Re-evaluated:Patient Re-evaluated prior to inductionOxygen Delivery Method: Circle System Utilized Preoxygenation: Pre-oxygenation with 100% oxygen Intubation Type: IV induction Ventilation: Mask ventilation without difficulty LMA: LMA inserted LMA Size: 4.0 Number of attempts: 1 Airway Equipment and Method: bite block Placement Confirmation: positive ETCO2 Tube secured with: Tape Dental Injury: Teeth and Oropharynx as per pre-operative assessment

## 2015-07-29 ENCOUNTER — Encounter (HOSPITAL_BASED_OUTPATIENT_CLINIC_OR_DEPARTMENT_OTHER): Payer: Self-pay | Admitting: Radiation Oncology

## 2015-08-02 ENCOUNTER — Encounter (HOSPITAL_BASED_OUTPATIENT_CLINIC_OR_DEPARTMENT_OTHER): Payer: Self-pay | Admitting: *Deleted

## 2015-08-02 NOTE — Progress Notes (Signed)
NPO AFTER MN.  ARRIVE AT 0600. CURRENT LABS RESULTS AND EKG IN CHART AND EPIC.  WILL TAKE AM MEDS W/ SIPS OF WATER WITH EXCEPTION NO HCTZ/ K-DUR.

## 2015-08-05 ENCOUNTER — Ambulatory Visit (HOSPITAL_BASED_OUTPATIENT_CLINIC_OR_DEPARTMENT_OTHER): Payer: Medicare Other | Admitting: Anesthesiology

## 2015-08-05 ENCOUNTER — Ambulatory Visit (HOSPITAL_COMMUNITY)
Admission: RE | Admit: 2015-08-05 | Discharge: 2015-08-05 | Disposition: A | Discharge status: Home / Self Care | Payer: Medicare Other | Source: Ambulatory Visit | Attending: Radiation Oncology | Admitting: Radiation Oncology

## 2015-08-05 ENCOUNTER — Ambulatory Visit
Admission: RE | Admit: 2015-08-05 | Discharge: 2015-08-05 | Disposition: A | Discharge status: Home / Self Care | Payer: Medicare Other | Source: Ambulatory Visit | Attending: Radiation Oncology | Admitting: Radiation Oncology

## 2015-08-05 ENCOUNTER — Ambulatory Visit (HOSPITAL_BASED_OUTPATIENT_CLINIC_OR_DEPARTMENT_OTHER)
Admission: RE | Admit: 2015-08-05 | Discharge: 2015-08-05 | Disposition: A | Discharge status: Home / Self Care | Payer: Medicare Other | Source: Ambulatory Visit | Attending: Radiation Oncology | Admitting: Radiation Oncology

## 2015-08-05 ENCOUNTER — Encounter (HOSPITAL_BASED_OUTPATIENT_CLINIC_OR_DEPARTMENT_OTHER): Payer: Self-pay

## 2015-08-05 ENCOUNTER — Encounter (HOSPITAL_BASED_OUTPATIENT_CLINIC_OR_DEPARTMENT_OTHER)
Admission: RE | Disposition: A | Discharge status: Home / Self Care | Payer: Self-pay | Source: Ambulatory Visit | Attending: Radiation Oncology

## 2015-08-05 VITALS — BP 119/72 | HR 72 | Temp 98.0°F | Resp 16

## 2015-08-05 DIAGNOSIS — C530 Malignant neoplasm of endocervix: Secondary | ICD-10-CM | POA: Diagnosis present

## 2015-08-05 DIAGNOSIS — E119 Type 2 diabetes mellitus without complications: Secondary | ICD-10-CM | POA: Insufficient documentation

## 2015-08-05 DIAGNOSIS — Z87891 Personal history of nicotine dependence: Secondary | ICD-10-CM | POA: Insufficient documentation

## 2015-08-05 DIAGNOSIS — K219 Gastro-esophageal reflux disease without esophagitis: Secondary | ICD-10-CM | POA: Diagnosis not present

## 2015-08-05 DIAGNOSIS — Z7982 Long term (current) use of aspirin: Secondary | ICD-10-CM | POA: Insufficient documentation

## 2015-08-05 DIAGNOSIS — Z8669 Personal history of other diseases of the nervous system and sense organs: Secondary | ICD-10-CM | POA: Insufficient documentation

## 2015-08-05 DIAGNOSIS — Z8049 Family history of malignant neoplasm of other genital organs: Secondary | ICD-10-CM | POA: Diagnosis not present

## 2015-08-05 DIAGNOSIS — Z510 Encounter for antineoplastic radiation therapy: Secondary | ICD-10-CM | POA: Diagnosis not present

## 2015-08-05 DIAGNOSIS — Z808 Family history of malignant neoplasm of other organs or systems: Secondary | ICD-10-CM | POA: Insufficient documentation

## 2015-08-05 DIAGNOSIS — Z79899 Other long term (current) drug therapy: Secondary | ICD-10-CM | POA: Diagnosis not present

## 2015-08-05 DIAGNOSIS — E785 Hyperlipidemia, unspecified: Secondary | ICD-10-CM | POA: Diagnosis not present

## 2015-08-05 DIAGNOSIS — I10 Essential (primary) hypertension: Secondary | ICD-10-CM | POA: Insufficient documentation

## 2015-08-05 DIAGNOSIS — M1990 Unspecified osteoarthritis, unspecified site: Secondary | ICD-10-CM | POA: Insufficient documentation

## 2015-08-05 DIAGNOSIS — C539 Malignant neoplasm of cervix uteri, unspecified: Secondary | ICD-10-CM

## 2015-08-05 DIAGNOSIS — Z791 Long term (current) use of non-steroidal anti-inflammatories (NSAID): Secondary | ICD-10-CM | POA: Insufficient documentation

## 2015-08-05 HISTORY — PX: TANDEM RING INSERTION: SHX6199

## 2015-08-05 LAB — GLUCOSE, CAPILLARY: GLUCOSE-CAPILLARY: 138 mg/dL — AB (ref 65–99)

## 2015-08-05 SURGERY — INSERTION, UTERINE TANDEM AND RING OR CYLINDER, FOR BRACHYTHERAPY
Anesthesia: General

## 2015-08-05 MED ORDER — FENTANYL CITRATE (PF) 100 MCG/2ML IJ SOLN
INTRAMUSCULAR | Status: AC
  Filled 2015-08-05: qty 2

## 2015-08-05 MED ORDER — ONDANSETRON HCL 4 MG/2ML IJ SOLN
INTRAMUSCULAR | Status: DC | PRN
  Administered 2015-08-05: 4 mg via INTRAVENOUS

## 2015-08-05 MED ORDER — WATER FOR IRRIGATION, STERILE IR SOLN
Status: DC | PRN
  Administered 2015-08-05: 3000 mL via INTRAVESICAL

## 2015-08-05 MED ORDER — PROMETHAZINE HCL 25 MG/ML IJ SOLN
6.2500 mg | INTRAMUSCULAR | Status: DC | PRN
  Filled 2015-08-05: qty 1

## 2015-08-05 MED ORDER — HYDROMORPHONE HCL 1 MG/ML IJ SOLN
0.5000 mg | Freq: Once | INTRAMUSCULAR | Status: AC
  Administered 2015-08-05: 0.5 mg via INTRAVENOUS
  Filled 2015-08-05: qty 1

## 2015-08-05 MED ORDER — FENTANYL CITRATE (PF) 100 MCG/2ML IJ SOLN
25.0000 ug | INTRAMUSCULAR | Status: DC | PRN
  Administered 2015-08-05: 50 ug via INTRAVENOUS
  Filled 2015-08-05: qty 1

## 2015-08-05 MED ORDER — FENTANYL CITRATE (PF) 100 MCG/2ML IJ SOLN
INTRAMUSCULAR | Status: DC | PRN
  Administered 2015-08-05: 25 ug via INTRAVENOUS

## 2015-08-05 MED ORDER — LACTATED RINGERS IV SOLN
INTRAVENOUS | Status: DC
  Administered 2015-08-05: 07:00:00 via INTRAVENOUS
  Filled 2015-08-05: qty 1000

## 2015-08-05 MED ORDER — MIDAZOLAM HCL 2 MG/2ML IJ SOLN
INTRAMUSCULAR | Status: AC
  Filled 2015-08-05: qty 2

## 2015-08-05 MED ORDER — LIDOCAINE HCL (CARDIAC) 20 MG/ML IV SOLN
INTRAVENOUS | Status: DC | PRN
  Administered 2015-08-05: 60 mg via INTRAVENOUS

## 2015-08-05 MED ORDER — DEXAMETHASONE SODIUM PHOSPHATE 4 MG/ML IJ SOLN
INTRAMUSCULAR | Status: DC | PRN
  Administered 2015-08-05: 10 mg via INTRAVENOUS

## 2015-08-05 MED ORDER — KETOROLAC TROMETHAMINE 30 MG/ML IJ SOLN
30.0000 mg | Freq: Once | INTRAMUSCULAR | Status: DC
  Filled 2015-08-05: qty 1

## 2015-08-05 MED ORDER — MIDAZOLAM HCL 5 MG/5ML IJ SOLN
INTRAMUSCULAR | Status: DC | PRN
  Administered 2015-08-05: 2 mg via INTRAVENOUS

## 2015-08-05 MED ORDER — ESTRADIOL 0.1 MG/GM VA CREA
TOPICAL_CREAM | VAGINAL | Status: DC | PRN
  Administered 2015-08-05: 1 via VAGINAL

## 2015-08-05 MED ORDER — PROPOFOL 10 MG/ML IV BOLUS
INTRAVENOUS | Status: DC | PRN
  Administered 2015-08-05: 130 mg via INTRAVENOUS

## 2015-08-05 MED ORDER — LACTATED RINGERS IV SOLN
INTRAVENOUS | Status: DC
  Administered 2015-08-05: 11:00:00 via INTRAVENOUS
  Filled 2015-08-05: qty 250

## 2015-08-05 SURGICAL SUPPLY — 33 items
BAG URINE DRAINAGE (UROLOGICAL SUPPLIES) ×3 IMPLANT
BNDG CONFORM 2 STRL LF (GAUZE/BANDAGES/DRESSINGS) IMPLANT
CATH FOLEY 2WAY SLVR  5CC 16FR (CATHETERS) ×2
CATH FOLEY 2WAY SLVR 5CC 16FR (CATHETERS) ×1 IMPLANT
CLOTH BEACON ORANGE TIMEOUT ST (SAFETY) ×3 IMPLANT
COVER BACK TABLE 60X90IN (DRAPES) ×3 IMPLANT
DRAPE LG THREE QUARTER DISP (DRAPES) ×3 IMPLANT
DRAPE UNDERBUTTOCKS STRL (DRAPE) ×3 IMPLANT
DRSG PAD ABDOMINAL 8X10 ST (GAUZE/BANDAGES/DRESSINGS) ×3 IMPLANT
GLOVE BIO SURGEON STRL SZ7.5 (GLOVE) ×6 IMPLANT
GOWN STRL REUS W/ TWL LRG LVL3 (GOWN DISPOSABLE) ×2 IMPLANT
GOWN STRL REUS W/TWL LRG LVL3 (GOWN DISPOSABLE) ×4
HOLDER FOLEY CATH W/STRAP (MISCELLANEOUS) ×3 IMPLANT
LEGGING LITHOTOMY PAIR STRL (DRAPES) ×3 IMPLANT
MANIFOLD NEPTUNE II (INSTRUMENTS) IMPLANT
NEEDLE SPNL 22GX3.5 QUINCKE BK (NEEDLE) IMPLANT
PACK BASIN DAY SURGERY FS (CUSTOM PROCEDURE TRAY) ×3 IMPLANT
PACKING VAGINAL (PACKING) IMPLANT
PAD ABD 8X10 STRL (GAUZE/BANDAGES/DRESSINGS) ×3 IMPLANT
PAD OB MATERNITY 4.3X12.25 (PERSONAL CARE ITEMS) ×3 IMPLANT
PAD PREP 24X48 CUFFED NSTRL (MISCELLANEOUS) ×3 IMPLANT
PLUG CATH AND CAP STER (CATHETERS) ×3 IMPLANT
SET IRRIG Y TYPE TUR BLADDER L (SET/KITS/TRAYS/PACK) ×3 IMPLANT
SUT PROLENE 0 SH 30 (SUTURE) IMPLANT
SUT SILK 2 0 30  PSL (SUTURE)
SUT SILK 2 0 30 PSL (SUTURE) IMPLANT
SYR BULB IRRIGATION 50ML (SYRINGE) IMPLANT
SYR CONTROL 10ML LL (SYRINGE) IMPLANT
SYRINGE 10CC LL (SYRINGE) ×3 IMPLANT
TOWEL OR 17X24 6PK STRL BLUE (TOWEL DISPOSABLE) ×6 IMPLANT
TRAY DSU PREP LF (CUSTOM PROCEDURE TRAY) ×3 IMPLANT
WATER STERILE IRR 3000ML UROMA (IV SOLUTION) ×3 IMPLANT
WATER STERILE IRR 500ML POUR (IV SOLUTION) ×3 IMPLANT

## 2015-08-05 NOTE — Anesthesia Postprocedure Evaluation (Signed)
  Anesthesia Post-op Note  Patient: Lori [name]  Procedure(s) Performed: Procedure(s) (LRB): TANDEM RING PLACEMENT  (N/A)  Patient Location: PACU  Anesthesia Type: General  Level of Consciousness: awake and alert   Airway and Oxygen Therapy: Patient Spontanous Breathing  Post-op Pain: mild  Post-op Assessment: Post-op Vital signs reviewed, Patient's Cardiovascular Status Stable, Respiratory Function Stable, Patent Airway and No signs of Nausea or vomiting  Last Vitals:  Filed Vitals:   08/05/15 0828  BP: 116/62  Pulse: 64  Temp: 36.7 C  Resp: 10    Post-op Vital Signs: stable   Complications: No apparent anesthesia complications

## 2015-08-05 NOTE — Progress Notes (Signed)
Received report from Ivin Booty, [place] in PACU.  Rylen has a right hand IV with LR infusing to gravity.  She has a foley catheter draining clear, yellow urine.  She denies having pain.  Transported to CT SIM with Gaspar Garbe, RN.

## 2015-08-05 NOTE — Progress Notes (Signed)
Lori [name] is reporting pain at a 5/10 due to pressure in her pelvic area.  0.5 mg dilaudid IV given per Dr. Sondra Come.  Will continue to monitor.

## 2015-08-05 NOTE — Interval H&P Note (Signed)
History and Physical Interval Note:  08/05/2015 7:36 AM  Lori [name]  has presented today for surgery, with the diagnosis of ENDOMETRIAL CANCER   The various methods of treatment have been discussed with the patient and family. After consideration of risks, benefits and other options for treatment, the patient has consented to  Procedure(s): TANDEM RING PLACEMENT  (N/A) as a surgical intervention .  The patient's history has been reviewed, patient examined, no change in status, stable for surgery.  I have reviewed the patient's chart and labs.  Questions were answered to the patient's satisfaction.     Gery Pray

## 2015-08-05 NOTE — Progress Notes (Signed)
Removed foley catheter intact.  Assisted Dr. Sondra Come with tandem and ring equipment.  Removed right hand IV intact.  Assisted patient with dressing.  Gave her discharge instructions and paperwork.

## 2015-08-05 NOTE — Progress Notes (Signed)
  Radiation Oncology         [phone] ________________________________  Name: Lori [name] MRN: [id number]  Date: 08/05/2015  DOB: 03-04-1951   HDR BRACHYTHERAPY  DIAGNOSIS:  Clinical stage IIB endocervical adenocarcinoma.  NARRATIVE: After planning was complete the patient was transferred to the high-dose-rate suite. She was placed in the treatment position. A fiducial marker was placed within the tandem ring system for imaging purposes.  Verification simulation note:  An AP and lateral film was obtained in the treatment position. This verified good position of the tandem/ring for treatment.  HDR brachytherapy treatment:  The remote afterloader was attached to the tandem/ring system by catheter system. The patient then proceeded to undergo her second high-dose-rate treatment directed at the cervical area. The patient was treated with iridium 192 as the high-dose-rate source. Patient was prescribed a dose of 5.5 gray to be delivered to the high risk CTV. This was achieved using 2 channels, that being the ring channel and the tandem channel. Total treatment time was 418.9 seconds. Patient tolerated her treatment well. After completion of her radiation therapy a survey was performed documenting return of the iridium source into the gamma med safe.  PLAN:  Patient will return next week for her third high-dose-rate treatment. In the meantime the patient will continue her external beam treatments at the [facility] in [place]. She will continue her left sidewall boost later this week or early next week.   This document serves as a record of services personally performed by Gery Pray, MD. It was created on his behalf by Lenn Cal, a trained medical scribe. The creation of this record is based on the scribe's personal observations and the provider's statements to them. This document has been checked and approved by the attending provider.     -----------------------------------  Blair Promise, PhD, MD

## 2015-08-05 NOTE — Addendum Note (Signed)
Encounter addended by: Jacqulyn Liner, RN on: 08/05/2015  2:29 PM<BR>     Documentation filed: Inpatient MAR

## 2015-08-05 NOTE — Anesthesia Procedure Notes (Addendum)
Procedure Name: LMA Insertion Date/Time: 08/05/2015 7:35 AM Performed by: Wanita Chamberlain Pre-anesthesia Checklist: Patient identified, Timeout performed, Emergency Drugs available, Suction available and Patient being monitored Patient Re-evaluated:Patient Re-evaluated prior to inductionOxygen Delivery Method: Circle system utilized Preoxygenation: Pre-oxygenation with 100% oxygen Intubation Type: IV induction Ventilation: Mask ventilation without difficulty LMA: LMA inserted LMA Size: 4.0 Number of attempts: 2 Placement Confirmation: breath sounds checked- equal and bilateral and positive ETCO2 Tube secured with: Tape Dental Injury: Teeth and Oropharynx as per pre-operative assessment

## 2015-08-05 NOTE — Progress Notes (Signed)
IMMEDIATELY FOLLOWING SURGERY: Do not drive or operate machinery for the first twenty four hours after surgery. Do not make any important decisions for twenty four hours after surgery or while taking narcotic pain medications or sedatives. If you develop intractable nausea and vomiting or a severe headache please notify your doctor immediately.   FOLLOW-UP: You do not need to follow up with anesthesia unless specifically instructed to do so.   WOUND CARE INSTRUCTIONS (if applicable): Expect some mild vaginal bleeding, but if large amount of bleeding occurs please contact Dr. Sondra Come at [phone] or the Radiation On-Call physician. Call for any fever greater than 101.0 degrees or increasing vaginal//abdominal pain or trouble urinating.   QUESTIONS?: Please feel free to call your physician or the hospital operator if you have any questions, and they will be happy to assist you. Resume all medications: as listed on your after visit summary. Your next appointment is:  Future Appointments Date Time Provider [place]  08/11/2015 8:00 AM [facility] PORT 1 [facility] [place]  08/11/2015 10:00 AM Gery Pray, MD [facility] None  08/11/2015 2:00 PM Gery Pray, MD [facility] None  08/17/2015 7:00 AM [facility] PORT 1 [facility] [place]  08/17/2015 9:00 AM Gery Pray, MD [facility] None  08/17/2015 1:30 PM Gery Pray, MD [facility] None  08/26/2015 7:00 AM [facility] PORT 1 [facility] [place]  08/26/2015 9:00 AM Gery Pray, MD [facility] None  08/26/2015 1:30 PM Gery Pray, MD [facility] None

## 2015-08-05 NOTE — Addendum Note (Signed)
Encounter addended by: Jacqulyn Liner, RN on: 08/05/2015  2:27 PM<BR>     Documentation filed: Lines/Drains/Airways Properties Editor, Inpatient Document Flowsheet, Notes Section, Dx Association, Orders

## 2015-08-05 NOTE — Progress Notes (Signed)
Patient back from CT SIM.  Changed her IV to the pump infusing at 125 ml/hr.  Call light in reach.  Will continue to monitor.

## 2015-08-05 NOTE — Progress Notes (Signed)
Patient reporting pain has decreased to a 3/10.

## 2015-08-05 NOTE — H&P (View-Only) (Signed)
Radiation Oncology         [phone] ________________________________  History and physical examination note  Name: Lori [name] MRN: [id number]  Date: 07/21/2015  DOB: 08/15/1951  CC:SHAH,ASHISH, MD  No ref. provider found   REFERRING PHYSICIAN: Rossi, Emma, MD  DIAGNOSIS: clinical stage IIB endocervical adenocarcinoma.  HISTORY OF PRESENT ILLNESS::Lori [name] is a 63 y.o. female who is  diagnosed with clinical stage II-B endocervical adenocarcinoma. She has been found to have left parametrial filtration on examination and an eroded cervix. Patient is been receiving radiosensitizing chemotherapy along with pelvic radiation therapy in the [facility]. Patient has completed approximately 25 treatments as of yesterday. She will be taken to the operating room on August 24 for exam under anesthesia and placement of cervical sleeve under the direction of Dr. Rossi as well as placement of tandem and ring for high-dose rate radiation therapy treatments.   PAST MEDICAL HISTORY:  has a past medical history of Depression; Hypertension; Hyperlipidemia; History of cerebral aneurysm repair; PMB (postmenopausal bleeding); History of seizures; Type 2 diabetes, diet controlled; GERD (gastroesophageal reflux disease); Arthritis; Endometrial carcinoma; and Cervical carcinoma (oncologist--  dr Yuleimy Kretz for radiation/   [facility] for chemotherapy).    PAST SURGICAL HISTORY: Past Surgical History  Procedure Laterality Date  . Craniotomy posterior fossa  1997    Repair aneurysm  x2  left side  . Port-a-cath placement  06/  2016    FAMILY HISTORY: family history includes Cervical cancer in her sister; Throat cancer in her father.  SOCIAL HISTORY:  reports that she quit smoking about 19 years ago. Her smoking use included Cigarettes. She quit after 5 years of use. She has never used smokeless tobacco. She reports that she does not drink alcohol or use illicit drugs.  ALLERGIES:  Contrast media; Dilantin; Latex; and Adhesive  MEDICATIONS:  No current facility-administered medications for this encounter.   Current Outpatient Prescriptions  Medication Sig Dispense Refill  . amLODipine (NORVASC) 10 MG tablet Take 10 mg by mouth every morning.   5  . aspirin 81 MG tablet Take 81 mg by mouth daily.    . benazepril (LOTENSIN) 40 MG tablet Take 40 mg by mouth every morning.   5  . carbamazepine (TEGRETOL) 100 MG chewable tablet Chew 100 mg by mouth 3 (three) times daily.   5  . cloNIDine (CATAPRES) 0.2 MG tablet Take 0.2 mg by mouth 3 (three) times daily.   6  . gabapentin (NEURONTIN) 300 MG capsule Take 300 mg by mouth 2 (two) times daily.   5  . hydrochlorothiazide (HYDRODIURIL) 25 MG tablet Take 25 mg by mouth every morning.   11  . ibuprofen (ADVIL,MOTRIN) 400 MG tablet Take 400 mg by mouth every 6 (six) hours as needed.   0  . loperamide (IMODIUM A-D) 2 MG tablet Take 2 mg by mouth 4 (four) times daily as needed for diarrhea or loose stools.    . metoprolol (LOPRESSOR) 50 MG tablet Take by mouth 2 (two) times daily. 1.5 tablets by mouth in the morning, 1 tablet in the evening  5  . potassium chloride SA (K-DUR,KLOR-CON) 20 MEQ tablet Take 20 mEq by mouth 2 (two) times daily.   6  . pravastatin (PRAVACHOL) 20 MG tablet Take 20 mg by mouth every evening.   2    REVIEW OF SYSTEMS:  A 15 point review of systems is documented in the electronic medical record. This was obtained by the nursing staff. However, I   reviewed this with the patient to discuss relevant findings and make appropriate changes.  Patient has had problems with diarrhea with her external beam treatments. This is managed well with Imodium she denies any nausea. Has some vaginal discharge but no active bleeding at this time.   PHYSICAL EXAM:  height is 5' 8" (1.727 m) and weight is 197 lb (89.359 kg).  this is a very pleasant 63-year-old female in no acute distress. She has left-sided weakness from prior  complications related to cerebral aneurysm vital signs at the [facility] on August 22 showed a temperature of 98.1 pulse of 76 respirations 18 blood pressure 141/68 oxygen saturation 98% on room air. Examination of the oral cavity reveals no secondary infection. No palpable adenopathy in the neck or supraclavicular region. The lungs are clear to auscultation. The heart has a regular rhythm and rate. The abdomen is soft and nontender with normal bowel sounds. Pelvic exam is deferred until intraoperative procedure. Extremities show good peripheral pulses. Mild edema and ankle and foot areas. The left-sided weakness. The patient ambulates with the assistance of a cane.    LABORATORY DATA:  To be scanned in from [facility],  August 16 white count 6.4 hemoglobin 10.0 hematocrit 29.7 platelet count 148,000 and absolute neutrophil count 4.9, glucose 120 BUN 13 creatinine 0.77 calcium 9.0 magnesium 1.5      IMPRESSION: Stage II-B endocervical adenocarcinoma. Patient will proceed with brachytherapy treatments using iridium 192 as the high-dose-rate source. Operative procedure scheduled for August 24 for placement of tandem ring in preparation for high-dose rate radiation therapy.    ------------------------------------------------  Ghazal Pevey D. Shoshannah Faubert, PhD, MD         

## 2015-08-05 NOTE — Progress Notes (Signed)
  Radiation Oncology         [phone] ________________________________  Name: Lori [name] MRN: [id number]  Date: 08/05/2015  DOB: 30-Jul-1951  SIMULATION AND TREATMENT PLANNING NOTE HDR BRACHYTHERAPY  DIAGNOSIS:     ICD-9-CM ICD-10-CM   1. Cervical cancer, FIGO stage IIB 180.9 C53.9 HYDROmorphone (DILAUDID) injection 0.5 mg    NARRATIVE:  The patient was brought to the [facility].  Identity was confirmed.  All relevant records and images related to the planned course of therapy were reviewed.  The patient freely provided informed written consent to proceed with treatment after reviewing the details related to the planned course of therapy. The consent form was witnessed and verified by the simulation staff.  Then, the patient was set-up in a stable reproducible  supine position for radiation therapy.  CT images were obtained.  Surface markings were placed.  The CT images were loaded into the planning software.  Then the target and avoidance structures were contoured.  Treatment planning then occurred.  The radiation prescription was entered and confirmed.   I have requested : Brachytherapy Isodose Plan and Dosimetry Calculations to plan the radiation distribution.    PLAN:  The patient will receive 5.5 Gy in 1 fraction directed at the cervix (HRCTV).    ________________________________  Blair Promise, PhD, MD

## 2015-08-05 NOTE — Op Note (Signed)
08/05/2015  8:57 AM  PATIENT:  Lori [name]  64 y.o. female  PRE-OPERATIVE DIAGNOSIS:  Cervical cancer  POST-OPERATIVE DIAGNOSIS:  Cervical cancer  PROCEDURE:  Procedure(s): TANDEM RING PLACEMENT  (N/A)  SURGEON:  Surgeon(s) and Role:    * Gery Pray, MD - Primary  PHYSICIAN ASSISTANT:   ASSISTANTS: none   ANESTHESIA:   general  EBL:  Total I/O In: 450 [I.V.:450] Out: 200 [Urine:200]  BLOOD ADMINISTERED:none  DRAINS: Urinary Catheter (Foley)   LOCAL MEDICATIONS USED:  NONE  SPECIMEN:  No Specimen  DISPOSITION OF SPECIMEN:  N/A  COUNTS:  YES  TOURNIQUET:  * No tourniquets in log *  DICTATION:Timeout was performed the patient, procedure, antibiotic, allergy, and length of procedure.The patient was identified in the preoperative holding area. Informed consent was signed on the chart. Patient was seen history was reviewed and exam was performed. The patient was then taken to the operating room and placed in the supine position with SCD hose on. General anesthesia was then induced without difficulty. She was then placed in the dorsolithotomy position. The perineum was prepped with chlorhexadine. The vagina was prepped with chlorhexadine. The patient was then draped after the prep was dried. An in and out catheterization to empty the bladder was performed under sterile conditions . A latex free Foley catheter was then placed. Exam under anesthesia revealed the cervix was noted to be flush with the vagina. Some thickening in the left parametrial area but no obvious involvement in this region. The cervix was noted to be normal in size. The cervical sleeve was noted to be in place however the sutures had become dislodged .    Intraoperative Ultrasound Confirmed a Accurate Placement of the cervical sleeve. Patient then had placement of a 40 mm, 60 high-dose-rate tandem within the uterine cavity and cervical sleeve. The patient then had placement of a 60 ring with small shielding  cap in place. This was affixed to the high-dose-rate tandem. Patient then had Estrace cream placed in the vaginal vault and placement of a rectal paddle. The rectal paddle was placed and secured to the tandem/ring apparatus. The position of the tandem and ring was verified with intraoperative ultrasound. Patient tolerated the procedure well  PLAN OF CARE: Transferred to radiation oncology for planning and treatment  PATIENT DISPOSITION:  PACU - hemodynamically stable.   Delay start of Pharmacological VTE agent (>24hrs) due to surgical blood loss or risk of bleeding: not applicable

## 2015-08-05 NOTE — Anesthesia Preprocedure Evaluation (Addendum)
Anesthesia Evaluation  Patient identified by MRN, date of birth, ID band Patient awake    Reviewed: Allergy & Precautions, NPO status , Patient's Chart, lab work & pertinent test results  Airway Mallampati: II  TM Distance: >3 FB Neck ROM: Full    Dental no notable dental hx. (+) Dental Advisory Given, Poor Dentition   Pulmonary neg pulmonary ROS, former smoker,  breath sounds clear to auscultation  Pulmonary exam normal       Cardiovascular hypertension, Pt. on medications Normal cardiovascular examRhythm:Regular Rate:Normal  28-Jul-2015  Sinus rhythm with 1st degree A-V block Incomplete left bundle branch block Left ventricular hypertrophy with repolarization abnormality Abnormal ECG  Denies chest pain or SOB   Neuro/Psych Depression History of seizures  post-op craniotomy for aneurysm 1997-- controlled w/ medication since then no seizures   negative neurological ROS     GI/Hepatic Neg liver ROS, GERD-  Medicated,  Endo/Other  diabetes, Type 2  Renal/GU negative Renal ROS  negative genitourinary   Musculoskeletal negative musculoskeletal ROS (+) Arthritis -,   Abdominal   Peds negative pediatric ROS (+)  Hematology negative hematology ROS (+)   Anesthesia Other Findings LATEX ALLERGY Endometrial CA  Reproductive/Obstetrics negative OB ROS                          Anesthesia Physical Anesthesia Plan  ASA: II  Anesthesia Plan: General   Post-op Pain Management:    Induction: Intravenous  Airway Management Planned: LMA  Additional Equipment:   Intra-op Plan:   Post-operative Plan: Extubation in OR  Informed Consent: I have reviewed the patients History and Physical, chart, labs and discussed the procedure including the risks, benefits and alternatives for the proposed anesthesia with the patient or authorized representative who has indicated his/her understanding and  acceptance.   Dental advisory given  Plan Discussed with: CRNA and Surgeon  Anesthesia Plan Comments:         Anesthesia Quick Evaluation

## 2015-08-05 NOTE — Addendum Note (Signed)
Encounter addended by: Margaret Pyle, [place] on: 08/05/2015  2:30 PM<BR>     Documentation filed: Rx Order Verification

## 2015-08-05 NOTE — Transfer of Care (Signed)
Immediate Anesthesia Transfer of Care Note  Patient: Lori [name]  Procedure(s) Performed: Procedure(s): TANDEM RING PLACEMENT  (N/A)  Patient Location: PACU  Anesthesia Type:General  Level of Consciousness: awake, alert , oriented and patient cooperative  Airway & Oxygen Therapy: Patient Spontanous Breathing and Patient connected to nasal cannula oxygen  Post-op Assessment: Report given to RN and Post -op Vital signs reviewed and stable  Post vital signs: Reviewed and stable  Last Vitals:  Filed Vitals:   08/05/15 0634  BP: 136/76  Pulse: 68  Temp: 36.8 C  Resp: 16    Complications: No apparent anesthesia complications

## 2015-08-06 ENCOUNTER — Encounter (HOSPITAL_BASED_OUTPATIENT_CLINIC_OR_DEPARTMENT_OTHER): Payer: Self-pay | Admitting: Radiation Oncology

## 2015-08-10 ENCOUNTER — Encounter (HOSPITAL_BASED_OUTPATIENT_CLINIC_OR_DEPARTMENT_OTHER): Payer: Self-pay | Admitting: *Deleted

## 2015-08-10 NOTE — Progress Notes (Signed)
NPO AFTER MN.  ARRIVE AT 0700.  CURRENT LAB RESULTS AND EKG IN CHART AND EPIC.  WILL TAKE AM MEDS W/ EXCEPTION NO HCTZ/ KDUR DOS W/ SIPS OF WATER.

## 2015-08-11 ENCOUNTER — Ambulatory Visit (HOSPITAL_BASED_OUTPATIENT_CLINIC_OR_DEPARTMENT_OTHER): Payer: Medicare Other | Admitting: Anesthesiology

## 2015-08-11 ENCOUNTER — Ambulatory Visit
Admission: RE | Admit: 2015-08-11 | Discharge: 2015-08-11 | Disposition: A | Discharge status: Home / Self Care | Payer: Medicare Other | Source: Ambulatory Visit | Attending: Radiation Oncology | Admitting: Radiation Oncology

## 2015-08-11 ENCOUNTER — Encounter (HOSPITAL_BASED_OUTPATIENT_CLINIC_OR_DEPARTMENT_OTHER): Payer: Self-pay | Admitting: *Deleted

## 2015-08-11 ENCOUNTER — Ambulatory Visit (HOSPITAL_BASED_OUTPATIENT_CLINIC_OR_DEPARTMENT_OTHER)
Admission: RE | Admit: 2015-08-11 | Discharge: 2015-08-11 | Disposition: A | Discharge status: Other Acute Inpatient Hospital | Payer: Medicare Other | Source: Ambulatory Visit | Attending: Radiation Oncology | Admitting: Radiation Oncology

## 2015-08-11 ENCOUNTER — Encounter (HOSPITAL_BASED_OUTPATIENT_CLINIC_OR_DEPARTMENT_OTHER)
Admission: RE | Disposition: A | Discharge status: Other Acute Inpatient Hospital | Payer: Self-pay | Source: Ambulatory Visit | Attending: Radiation Oncology

## 2015-08-11 ENCOUNTER — Ambulatory Visit (HOSPITAL_COMMUNITY)
Admission: RE | Admit: 2015-08-11 | Discharge: 2015-08-11 | Disposition: A | Discharge status: Home / Self Care | Payer: Medicare Other | Source: Ambulatory Visit | Attending: Radiation Oncology | Admitting: Radiation Oncology

## 2015-08-11 VITALS — BP 109/57 | HR 69 | Temp 99.1°F

## 2015-08-11 DIAGNOSIS — E785 Hyperlipidemia, unspecified: Secondary | ICD-10-CM | POA: Diagnosis not present

## 2015-08-11 DIAGNOSIS — K219 Gastro-esophageal reflux disease without esophagitis: Secondary | ICD-10-CM | POA: Insufficient documentation

## 2015-08-11 DIAGNOSIS — E119 Type 2 diabetes mellitus without complications: Secondary | ICD-10-CM | POA: Insufficient documentation

## 2015-08-11 DIAGNOSIS — C539 Malignant neoplasm of cervix uteri, unspecified: Secondary | ICD-10-CM

## 2015-08-11 DIAGNOSIS — Z87891 Personal history of nicotine dependence: Secondary | ICD-10-CM | POA: Diagnosis not present

## 2015-08-11 DIAGNOSIS — I10 Essential (primary) hypertension: Secondary | ICD-10-CM | POA: Insufficient documentation

## 2015-08-11 DIAGNOSIS — Z7982 Long term (current) use of aspirin: Secondary | ICD-10-CM | POA: Insufficient documentation

## 2015-08-11 DIAGNOSIS — C530 Malignant neoplasm of endocervix: Secondary | ICD-10-CM | POA: Insufficient documentation

## 2015-08-11 DIAGNOSIS — Z800 Family history of malignant neoplasm of digestive organs: Secondary | ICD-10-CM | POA: Diagnosis not present

## 2015-08-11 DIAGNOSIS — M6281 Muscle weakness (generalized): Secondary | ICD-10-CM | POA: Insufficient documentation

## 2015-08-11 DIAGNOSIS — M1990 Unspecified osteoarthritis, unspecified site: Secondary | ICD-10-CM | POA: Insufficient documentation

## 2015-08-11 DIAGNOSIS — F329 Major depressive disorder, single episode, unspecified: Secondary | ICD-10-CM | POA: Diagnosis not present

## 2015-08-11 DIAGNOSIS — Z510 Encounter for antineoplastic radiation therapy: Secondary | ICD-10-CM | POA: Diagnosis not present

## 2015-08-11 HISTORY — PX: TANDEM RING INSERTION: SHX6199

## 2015-08-11 LAB — GLUCOSE, CAPILLARY
GLUCOSE-CAPILLARY: 142 mg/dL — AB (ref 65–99)
Glucose-Capillary: 119 mg/dL — ABNORMAL HIGH (ref 65–99)

## 2015-08-11 SURGERY — INSERTION, UTERINE TANDEM AND RING OR CYLINDER, FOR BRACHYTHERAPY
Anesthesia: General | Site: Vagina

## 2015-08-11 MED ORDER — FENTANYL CITRATE (PF) 100 MCG/2ML IJ SOLN
INTRAMUSCULAR | Status: AC
  Filled 2015-08-11: qty 4

## 2015-08-11 MED ORDER — FENTANYL CITRATE (PF) 100 MCG/2ML IJ SOLN
INTRAMUSCULAR | Status: DC | PRN
  Administered 2015-08-11 (×2): 25 ug via INTRAVENOUS

## 2015-08-11 MED ORDER — LACTATED RINGERS IV SOLN
INTRAVENOUS | Status: DC
  Administered 2015-08-11 (×2): via INTRAVENOUS
  Filled 2015-08-11: qty 1000

## 2015-08-11 MED ORDER — HYDROMORPHONE HCL 1 MG/ML IJ SOLN
0.5000 mg | Freq: Once | INTRAMUSCULAR | Status: AC
  Administered 2015-08-11: 0.5 mg via INTRAVENOUS
  Filled 2015-08-11: qty 1

## 2015-08-11 MED ORDER — LACTATED RINGERS IV SOLN
INTRAVENOUS | Status: DC
  Administered 2015-08-11: 13:00:00 via INTRAVENOUS
  Filled 2015-08-11: qty 250

## 2015-08-11 MED ORDER — PROPOFOL 10 MG/ML IV BOLUS
INTRAVENOUS | Status: DC | PRN
  Administered 2015-08-11: 140 mg via INTRAVENOUS
  Administered 2015-08-11: 50 mg via INTRAVENOUS

## 2015-08-11 MED ORDER — MIDAZOLAM HCL 2 MG/2ML IJ SOLN
INTRAMUSCULAR | Status: AC
  Filled 2015-08-11: qty 2

## 2015-08-11 MED ORDER — KETOROLAC TROMETHAMINE 30 MG/ML IJ SOLN
30.0000 mg | Freq: Once | INTRAMUSCULAR | Status: DC
  Filled 2015-08-11: qty 1

## 2015-08-11 MED ORDER — WATER FOR IRRIGATION, STERILE IR SOLN
Status: DC | PRN
  Administered 2015-08-11: 3000 mL via INTRAVESICAL

## 2015-08-11 MED ORDER — PROMETHAZINE HCL 25 MG/ML IJ SOLN
6.2500 mg | INTRAMUSCULAR | Status: DC | PRN
  Filled 2015-08-11: qty 1

## 2015-08-11 MED ORDER — DEXAMETHASONE SODIUM PHOSPHATE 4 MG/ML IJ SOLN
INTRAMUSCULAR | Status: DC | PRN
  Administered 2015-08-11: 10 mg via INTRAVENOUS

## 2015-08-11 MED ORDER — FENTANYL CITRATE (PF) 100 MCG/2ML IJ SOLN
25.0000 ug | INTRAMUSCULAR | Status: DC | PRN
  Administered 2015-08-11 (×2): 25 ug via INTRAVENOUS
  Filled 2015-08-11: qty 1

## 2015-08-11 MED ORDER — ONDANSETRON HCL 4 MG/2ML IJ SOLN
INTRAMUSCULAR | Status: DC | PRN
Start: 2015-08-11 — End: 2015-08-11
  Administered 2015-08-11: 4 mg via INTRAVENOUS

## 2015-08-11 MED ORDER — MIDAZOLAM HCL 5 MG/5ML IJ SOLN
INTRAMUSCULAR | Status: DC | PRN
  Administered 2015-08-11: 1 mg via INTRAVENOUS

## 2015-08-11 MED ORDER — LIDOCAINE HCL (CARDIAC) 20 MG/ML IV SOLN
INTRAVENOUS | Status: DC | PRN
  Administered 2015-08-11: 60 mg via INTRAVENOUS

## 2015-08-11 MED ORDER — FENTANYL CITRATE (PF) 100 MCG/2ML IJ SOLN
INTRAMUSCULAR | Status: AC
  Filled 2015-08-11: qty 2

## 2015-08-11 SURGICAL SUPPLY — 37 items
BAG URINE DRAINAGE (UROLOGICAL SUPPLIES) ×3 IMPLANT
BNDG CONFORM 2 STRL LF (GAUZE/BANDAGES/DRESSINGS) IMPLANT
CATH FOLEY 2WAY SLVR  5CC 16FR (CATHETERS)
CATH FOLEY 2WAY SLVR 5CC 16FR (CATHETERS) IMPLANT
CATH SILICONE 16FRX5CC (CATHETERS) ×3 IMPLANT
CLOTH BEACON ORANGE TIMEOUT ST (SAFETY) ×3 IMPLANT
COVER BACK TABLE 60X90IN (DRAPES) ×3 IMPLANT
DRAPE LG THREE QUARTER DISP (DRAPES) ×3 IMPLANT
DRAPE UNDERBUTTOCKS STRL (DRAPE) ×3 IMPLANT
GLOVE BIO SURGEON STRL SZ7.5 (GLOVE) IMPLANT
GLOVE BIOGEL PI IND STRL 6.5 (GLOVE) ×2 IMPLANT
GLOVE BIOGEL PI IND STRL 7.5 (GLOVE) ×2 IMPLANT
GLOVE BIOGEL PI INDICATOR 6.5 (GLOVE) ×4
GLOVE BIOGEL PI INDICATOR 7.5 (GLOVE) ×4
GOWN STRL REUS W/ TWL LRG LVL3 (GOWN DISPOSABLE) ×2 IMPLANT
GOWN STRL REUS W/TWL LRG LVL3 (GOWN DISPOSABLE) ×4
HOLDER FOLEY CATH W/STRAP (MISCELLANEOUS) ×3 IMPLANT
LEGGING LITHOTOMY PAIR STRL (DRAPES) ×3 IMPLANT
MANIFOLD NEPTUNE II (INSTRUMENTS) IMPLANT
NEEDLE SPNL 22GX3.5 QUINCKE BK (NEEDLE) IMPLANT
PACK BASIN DAY SURGERY FS (CUSTOM PROCEDURE TRAY) ×3 IMPLANT
PACKING VAGINAL (PACKING) IMPLANT
PAD ABD 8X10 STRL (GAUZE/BANDAGES/DRESSINGS) ×3 IMPLANT
PAD OB MATERNITY 4.3X12.25 (PERSONAL CARE ITEMS) IMPLANT
PAD PREP 24X48 CUFFED NSTRL (MISCELLANEOUS) ×3 IMPLANT
PLUG CATH AND CAP STER (CATHETERS) ×3 IMPLANT
SET IRRIG Y TYPE TUR BLADDER L (SET/KITS/TRAYS/PACK) ×3 IMPLANT
SUT PROLENE 0 SH 30 (SUTURE) IMPLANT
SUT SILK 2 0 30  PSL (SUTURE)
SUT SILK 2 0 30 PSL (SUTURE) IMPLANT
SYR BULB IRRIGATION 50ML (SYRINGE) IMPLANT
SYR CONTROL 10ML LL (SYRINGE) IMPLANT
SYRINGE 10CC LL (SYRINGE) ×3 IMPLANT
TOWEL OR 17X24 6PK STRL BLUE (TOWEL DISPOSABLE) ×6 IMPLANT
TRAY DSU PREP LF (CUSTOM PROCEDURE TRAY) ×3 IMPLANT
WATER STERILE IRR 3000ML UROMA (IV SOLUTION) ×3 IMPLANT
WATER STERILE IRR 500ML POUR (IV SOLUTION) ×3 IMPLANT

## 2015-08-11 NOTE — Discharge Instructions (Signed)
Call your surgeon if you experience:  ° °1.  Fever over 101.0. °2.  Inability to urinate. °3.  Nausea and/or vomiting. °4.  Extreme swelling or bruising at the surgical site. °5.  Continued bleeding from the incision. °6.  Increased pain, redness or drainage from the incision. °7.  Problems related to your pain medication. °8.  Any problems and/or concerns ° ° °Post Anesthesia Home Care Instructions ° °Activity: °Get plenty of rest for the remainder of the day. A responsible adult should stay with you for 24 hours following the procedure.  °For the next 24 hours, DO NOT: °-Drive a car °-Operate machinery °-Drink alcoholic beverages °-Take any medication unless instructed by your physician °-Make any legal decisions or sign important papers. ° °Meals: °Start with liquid foods such as gelatin or soup. Progress to regular foods as tolerated. Avoid greasy, spicy, heavy foods. If nausea and/or vomiting occur, drink only clear liquids until the nausea and/or vomiting subsides. Call your physician if vomiting continues. ° °Special Instructions/Symptoms: °Your throat may feel dry or sore from the anesthesia or the breathing tube placed in your throat during surgery. If this causes discomfort, gargle with warm salt water. The discomfort should disappear within 24 hours. ° °If you had a scopolamine patch placed behind your ear for the management of post- operative nausea and/or vomiting: ° °1. The medication in the patch is effective for 72 hours, after which it should be removed.  Wrap patch in a tissue and discard in the trash. Wash hands thoroughly with soap and water. °2. You may remove the patch earlier than 72 hours if you experience unpleasant side effects which may include dry mouth, dizziness or visual disturbances. °3. Avoid touching the patch. Wash your hands with soap and water after contact with the patch. °  ° °

## 2015-08-11 NOTE — Anesthesia Preprocedure Evaluation (Addendum)
Anesthesia Evaluation  Patient identified by MRN, date of birth, ID band Patient awake    Reviewed: Allergy & Precautions, NPO status , Patient's Chart, lab work & pertinent test results  Airway Mallampati: II  TM Distance: >3 FB Neck ROM: Full    Dental no notable dental hx. (+) Teeth Intact, Dental Advisory Given, Poor Dentition   Pulmonary neg pulmonary ROS, former smoker,    Pulmonary exam normal breath sounds clear to auscultation       Cardiovascular hypertension, Pt. on medications Normal cardiovascular exam Rhythm:Regular Rate:Normal     Neuro/Psych Depression S/p Crani for repair of cerebral aneurysm 1997 Denies any seizures negative neurological ROS  negative psych ROS   GI/Hepatic negative GI ROS, Neg liver ROS, GERD  Medicated and Controlled,  Endo/Other  negative endocrine ROSdiabetes, Type 2  Renal/GU negative Renal ROS  negative genitourinary   Musculoskeletal negative musculoskeletal ROS (+) Arthritis , Osteoarthritis,    Abdominal   Peds negative pediatric ROS (+)  Hematology negative hematology ROS (+)   Anesthesia Other Findings   Reproductive/Obstetrics negative OB ROS                          Anesthesia Physical Anesthesia Plan  ASA: II  Anesthesia Plan: General   Post-op Pain Management:    Induction: Intravenous  Airway Management Planned: LMA  Additional Equipment:   Intra-op Plan:   Post-operative Plan: Extubation in OR  Informed Consent: I have reviewed the patients History and Physical, chart, labs and discussed the procedure including the risks, benefits and alternatives for the proposed anesthesia with the patient or authorized representative who has indicated his/her understanding and acceptance.   Dental advisory given  Plan Discussed with: CRNA, Surgeon and Anesthesiologist  Anesthesia Plan Comments:        Anesthesia Quick  Evaluation

## 2015-08-11 NOTE — Progress Notes (Signed)
Patient back from CT SIM.  Changed IV tubing to pump tubing.  LR infusing at 125 ml/hr.  Call light in reach.  Will continue to monitor.

## 2015-08-11 NOTE — Progress Notes (Signed)
IMMEDIATELY FOLLOWING SURGERY: Do not drive or operate machinery for the first twenty four hours after surgery. Do not make any important decisions for twenty four hours after surgery or while taking narcotic pain medications or sedatives. If you develop intractable nausea and vomiting or a severe headache please notify your doctor immediately.   FOLLOW-UP: You do not need to follow up with anesthesia unless specifically instructed to do so.   WOUND CARE INSTRUCTIONS (if applicable): Expect some mild vaginal bleeding, but if large amount of bleeding occurs please contact Dr. Sondra Come at [phone] or the Radiation On-Call physician. Call for any fever greater than 101.0 degrees or increasing vaginal//abdominal pain or trouble urinating.   QUESTIONS?: Please feel free to call your physician or the hospital operator if you have any questions, and they will be happy to assist you. Resume all medications: as listed on your after visit summary. Your next appointment is:  Future Appointments Date Time Provider [place]  08/17/2015 7:00 AM WL-US 1 WL-US [place]  08/17/2015 9:00 AM Gery Pray, MD [facility] None  08/17/2015 1:30 PM Gery Pray, MD [facility] None  08/26/2015 7:00 AM WL-US 1 WL-US [place]  08/26/2015 9:00 AM Gery Pray, MD [facility] None  08/26/2015 1:30 PM Gery Pray, MD [facility] None

## 2015-08-11 NOTE — Anesthesia Postprocedure Evaluation (Signed)
  Anesthesia Post-op Note  Patient: Lori [name]  Procedure(s) Performed: Procedure(s) (LRB): TANDEM RING PLACEMENT  (N/A)  Patient Location: PACU  Anesthesia Type: General  Level of Consciousness: awake and alert   Airway and Oxygen Therapy: Patient Spontanous Breathing  Post-op Pain: mild  Post-op Assessment: Post-op Vital signs reviewed, Patient's Cardiovascular Status Stable, Respiratory Function Stable, Patent Airway and No signs of Nausea or vomiting  Last Vitals:  Filed Vitals:   08/11/15 0854  BP: 125/68  Pulse: 58  Temp: 36.8 C  Resp: 14    Post-op Vital Signs: stable   Complications: No apparent anesthesia complications

## 2015-08-11 NOTE — Progress Notes (Signed)
  Radiation Oncology         [phone] ________________________________  Name: Lori [name] MRN: [id number]  Date: 08/11/2015  DOB: May 23, 1951  SIMULATION AND TREATMENT PLANNING NOTE HDR BRACHYTHERAPY  DIAGNOSIS:     ICD-9-CM ICD-10-CM   1. Cervical cancer, FIGO stage IIB 180.9 C53.9 HYDROmorphone (DILAUDID) injection 0.5 mg    NARRATIVE:  The patient was brought to the [place].  Identity was confirmed.  All relevant records and images related to the planned course of therapy were reviewed.  The patient freely provided informed written consent to proceed with treatment after reviewing the details related to the planned course of therapy. The consent form was witnessed and verified by the simulation staff.  Then, the patient was set-up in a stable reproducible  supine position for radiation therapy.  CT images were obtained.  Surface markings were placed.  The CT images were loaded into the planning software.  Then the target and avoidance structures were contoured.  Treatment planning then occurred.  The radiation prescription was entered and confirmed.   I have requested : Brachytherapy Isodose Plan and Dosimetry Calculations to plan the radiation distribution.    PLAN:  The patient will receive 5.5 Gy in 1 fraction directed at the cervix (HRCTV).  This document serves as a record of services personally performed by Gery Pray, MD. It was created on his behalf by Arlyce Harman, a trained medical scribe. The creation of this record is based on the scribe's personal observations and the provider's statements to them. This document has been checked and approved by the attending provider. ________________________________  Blair Promise, PhD, MD

## 2015-08-11 NOTE — Anesthesia Procedure Notes (Signed)
Procedure Name: LMA Insertion Date/Time: 08/11/2015 8:19 AM Performed by: Wanita Chamberlain Pre-anesthesia Checklist: Patient identified, Timeout performed, Emergency Drugs available, Suction available and Patient being monitored Patient Re-evaluated:Patient Re-evaluated prior to inductionOxygen Delivery Method: Circle system utilized Preoxygenation: Pre-oxygenation with 100% oxygen Intubation Type: IV induction Ventilation: Mask ventilation without difficulty LMA: LMA inserted LMA Size: 4.0 Number of attempts: 1 Placement Confirmation: positive ETCO2 and breath sounds checked- equal and bilateral Tube secured with: Tape Dental Injury: Teeth and Oropharynx as per pre-operative assessment

## 2015-08-11 NOTE — Op Note (Signed)
08/11/2015  9:18 AM  PATIENT:  Lori [name]  64 y.o. female  PRE-OPERATIVE DIAGNOSIS:  Cerivical CANCER   POST-OPERATIVE DIAGNOSIS:  Cervical CANCER   PROCEDURE:  Procedure(s): TANDEM RING PLACEMENT  (N/A)  SURGEON:  Surgeon(s) and Role:    * Gery Pray, MD - Primary  PHYSICIAN ASSISTANT:   ASSISTANTS: none   ANESTHESIA:   general  EBL:  Total I/O In: 600 [I.V.:600] Out: - 200  BLOOD ADMINISTERED:none  DRAINS: Urinary Catheter (Foley)   LOCAL MEDICATIONS USED:  OTHER none  SPECIMEN:  No Specimen  DISPOSITION OF SPECIMEN:  N/A  COUNTS:  YES  TOURNIQUET:  * No tourniquets in log *  DICTATION:Timeout was performed for the patient, procedure, antibiotic, allergy, and length of procedure.The patient was identified in the preoperative holding area. Informed consent was signed on the chart. Patient was seen history was reviewed and exam was performed. The patient was then taken to the operating room and placed in the supine position with SCD hose on. General anesthesia was then induced without difficulty. She was then placed in the dorsolithotomy position. The perineum was prepped with chlorhexadine. The vagina was prepped with chlorhexadine. The patient was then draped after the prep was dried. An in and out catheterization to empty the bladder was performed under sterile conditions . A latex free Foley catheter was then placed. Exam under anesthesia revealed the cervix was noted to be flush with the vagina. The cervix was noted to be normal in size. The cervical sleeve was not present as this fell out with the tandem Ring removal on the patient's last treatment.  The patient proceeded to undergo sounding of the uterus. The uterus sounded to ~ 8 cm on examination. Patient proceeded to undergo dilation of the cervical os. A 60  millimeter cervical sleeve was placed within the uterus and cervix region.  . Intraoperative Ultrasound Confirmed a Accurate Placement of the cervical  sleeve. Patient then had placement of a 60 mm, 45 high-dose-rate tandem within the uterine cavity and cervical sleeve. The patient then had placement of a 45 ring with small shielding cap in place. This was affixed to the high-dose-rate tandem. The patient then had placement of a rectal paddle. The rectal paddle was placed and secured to the tandem/ring apparatus. Accurate position of the tandem and ring was verified with intraoperative ultrasound. Patient tolerated the procedure well  PLAN OF CARE: Transferred to radiation oncology for planning and treatment  PATIENT DISPOSITION:  PACU - hemodynamically stable.   Delay start of Pharmacological VTE agent (>24hrs) due to surgical blood loss or risk of bleeding: not applicable

## 2015-08-11 NOTE — Progress Notes (Signed)
Foley catheter removed intact.  Assisted Dr. Sondra Come with tandem equipment removal.  Removed left hand IV intact.  Pressure and bandaid applied.  Patient given discharge paperwork and was escorted to the lobby in a wheelchair by her son.

## 2015-08-11 NOTE — Interval H&P Note (Signed)
History and Physical Interval Note:  08/11/2015 8:17 AM  Lori [name]  has presented today for surgery, with the diagnosis of cervical CANCER   The various methods of treatment have been discussed with the patient and family. After consideration of risks, benefits and other options for treatment, the patient has consented to  Procedure(s): TANDEM RING PLACEMENT  (N/A) as a surgical intervention .  The patient's history has been reviewed, patient examined, no change in status, stable for surgery.  I have reviewed the patient's chart and labs.  Questions were answered to the patient's satisfaction.     Gery Pray

## 2015-08-11 NOTE — Transfer of Care (Signed)
Immediate Anesthesia Transfer of Care Note  Patient: Lori [name]  Procedure(s) Performed: Procedure(s): TANDEM RING PLACEMENT  (N/A)  Patient Location: PACU  Anesthesia Type:General  Level of Consciousness: awake, alert , oriented and patient cooperative  Airway & Oxygen Therapy: Patient Spontanous Breathing and Patient connected to nasal cannula oxygen  Post-op Assessment: Report given to RN and Post -op Vital signs reviewed and stable  Post vital signs: Reviewed and stable  Last Vitals:  Filed Vitals:   08/11/15 0845  BP:   Pulse:   Temp: 36.8 C  Resp:     Complications: No apparent anesthesia complications

## 2015-08-11 NOTE — Progress Notes (Signed)
  Radiation Oncology         [phone] ________________________________  Name: Lori [name] MRN: [id number]  Date: 08/11/2015  DOB: Apr 16, 1951   HDR BRACHYTHERAPY  DIAGNOSIS:  Clinical stage IIB endocervical adenocarcinoma.  NARRATIVE: After planning was complete the patient was transferred to the high-dose-rate suite. She was placed in the treatment position. A fiducial marker was placed within the tandem ring system for imaging purposes.  Verification simulation note:  An AP and lateral film was obtained in the treatment position. This verified good position of the tandem/ring for treatment.  HDR brachytherapy treatment:  The remote afterloader was attached to the tandem/ring system by catheter system. The patient then proceeded to undergo her third high-dose-rate treatment directed at the cervical area. The patient was treated with iridium 192 as the high-dose-rate source. Patient was prescribed a dose of 5.5 gray to be delivered to the high risk CTV. This was achieved using 2 channels, that being the ring channel and the tandem channel. Total treatment time was 476.8 seconds. Patient tolerated her treatment well. After completion of her radiation therapy a survey was performed documenting return of the iridium source into the gamma med safe.  PLAN:  Patient will return next week for her fourth high-dose-rate treatment.  This document serves as a record of services personally performed by Gery Pray, MD. It was created on his behalf by Arlyce Harman, a trained medical scribe. The creation of this record is based on the scribe's personal observations and the provider's statements to them. This document has been checked and approved by the attending provider. -----------------------------------  Blair Promise, PhD, MD

## 2015-08-11 NOTE — Progress Notes (Signed)
Received report from [place], RN in PACU.  Patient has a foley catheter draining yellow urine.  She has LR infusing to gravity into her left hand IV.  Transported her to CT SIM with Nicki Reaper, Nurse Transporter.

## 2015-08-11 NOTE — H&P (View-Only) (Signed)
Radiation Oncology         [phone] ________________________________  History and physical examination note  Name: Lori [name] MRN: [id number]  Date: 07/21/2015  DOB: 04/20/1951  CC:SHAH,ASHISH, MD  No ref. provider found   REFERRING PHYSICIAN: Rossi, Emma, MD  DIAGNOSIS: clinical stage IIB endocervical adenocarcinoma.  HISTORY OF PRESENT ILLNESS::Lori [name] is a 63 y.o. female who is  diagnosed with clinical stage II-B endocervical adenocarcinoma. She has been found to have left parametrial filtration on examination and an eroded cervix. Patient is been receiving radiosensitizing chemotherapy along with pelvic radiation therapy in the [facility]. Patient has completed approximately 25 treatments as of yesterday. She will be taken to the operating room on August 24 for exam under anesthesia and placement of cervical sleeve under the direction of Dr. Rossi as well as placement of tandem and ring for high-dose rate radiation therapy treatments.   PAST MEDICAL HISTORY:  has a past medical history of Depression; Hypertension; Hyperlipidemia; History of cerebral aneurysm repair; PMB (postmenopausal bleeding); History of seizures; Type 2 diabetes, diet controlled; GERD (gastroesophageal reflux disease); Arthritis; Endometrial carcinoma; and Cervical carcinoma (oncologist--  dr Lahna Nath for radiation/   [facility] for chemotherapy).    PAST SURGICAL HISTORY: Past Surgical History  Procedure Laterality Date  . Craniotomy posterior fossa  1997    Repair aneurysm  x2  left side  . Port-a-cath placement  06/  2016    FAMILY HISTORY: family history includes Cervical cancer in her sister; Throat cancer in her father.  SOCIAL HISTORY:  reports that she quit smoking about 19 years ago. Her smoking use included Cigarettes. She quit after 5 years of use. She has never used smokeless tobacco. She reports that she does not drink alcohol or use illicit drugs.  ALLERGIES:  Contrast media; Dilantin; Latex; and Adhesive  MEDICATIONS:  No current facility-administered medications for this encounter.   Current Outpatient Prescriptions  Medication Sig Dispense Refill  . amLODipine (NORVASC) 10 MG tablet Take 10 mg by mouth every morning.   5  . aspirin 81 MG tablet Take 81 mg by mouth daily.    . benazepril (LOTENSIN) 40 MG tablet Take 40 mg by mouth every morning.   5  . carbamazepine (TEGRETOL) 100 MG chewable tablet Chew 100 mg by mouth 3 (three) times daily.   5  . cloNIDine (CATAPRES) 0.2 MG tablet Take 0.2 mg by mouth 3 (three) times daily.   6  . gabapentin (NEURONTIN) 300 MG capsule Take 300 mg by mouth 2 (two) times daily.   5  . hydrochlorothiazide (HYDRODIURIL) 25 MG tablet Take 25 mg by mouth every morning.   11  . ibuprofen (ADVIL,MOTRIN) 400 MG tablet Take 400 mg by mouth every 6 (six) hours as needed.   0  . loperamide (IMODIUM A-D) 2 MG tablet Take 2 mg by mouth 4 (four) times daily as needed for diarrhea or loose stools.    . metoprolol (LOPRESSOR) 50 MG tablet Take by mouth 2 (two) times daily. 1.5 tablets by mouth in the morning, 1 tablet in the evening  5  . potassium chloride SA (K-DUR,KLOR-CON) 20 MEQ tablet Take 20 mEq by mouth 2 (two) times daily.   6  . pravastatin (PRAVACHOL) 20 MG tablet Take 20 mg by mouth every evening.   2    REVIEW OF SYSTEMS:  A 15 point review of systems is documented in the electronic medical record. This was obtained by the nursing staff. However, I   reviewed this with the patient to discuss relevant findings and make appropriate changes.  Patient has had problems with diarrhea with her external beam treatments. This is managed well with Imodium she denies any nausea. Has some vaginal discharge but no active bleeding at this time.   PHYSICAL EXAM:  height is 5' 8" (1.727 m) and weight is 197 lb (89.359 kg).  this is a very pleasant 63-year-old female in no acute distress. She has left-sided weakness from prior  complications related to cerebral aneurysm vital signs at the [facility] on August 22 showed a temperature of 98.1 pulse of 76 respirations 18 blood pressure 141/68 oxygen saturation 98% on room air. Examination of the oral cavity reveals no secondary infection. No palpable adenopathy in the neck or supraclavicular region. The lungs are clear to auscultation. The heart has a regular rhythm and rate. The abdomen is soft and nontender with normal bowel sounds. Pelvic exam is deferred until intraoperative procedure. Extremities show good peripheral pulses. Mild edema and ankle and foot areas. The left-sided weakness. The patient ambulates with the assistance of a cane.    LABORATORY DATA:  To be scanned in from [facility],  August 16 white count 6.4 hemoglobin 10.0 hematocrit 29.7 platelet count 148,000 and absolute neutrophil count 4.9, glucose 120 BUN 13 creatinine 0.77 calcium 9.0 magnesium 1.5      IMPRESSION: Stage II-B endocervical adenocarcinoma. Patient will proceed with brachytherapy treatments using iridium 192 as the high-dose-rate source. Operative procedure scheduled for August 24 for placement of tandem ring in preparation for high-dose rate radiation therapy.    ------------------------------------------------  Labrisha Wuellner D. Ewald Beg, PhD, MD         

## 2015-08-11 NOTE — Progress Notes (Signed)
  Radiation Oncology         [phone] ________________________________  Name: Lori [name] MRN: [id number]  Date: 08/11/2015  DOB: 1951-11-28  SIMULATION AND TREATMENT PLANNING NOTE HDR BRACHYTHERAPY  DIAGNOSIS:  Cervical cancer, FIGO stage IIB   NARRATIVE:  The patient was brought to the [facility] suite.  Identity was confirmed.  All relevant records and images related to the planned course of therapy were reviewed.  The patient freely provided informed written consent to proceed with treatment after reviewing the details related to the planned course of therapy. The consent form was witnessed and verified by the simulation staff.  Then, the patient was set-up in a stable reproducible  supine position for radiation therapy.  CT images were obtained.  Surface markings were placed.  The CT images were loaded into the planning software.  Then the target and avoidance structures were contoured.  Treatment planning then occurred.  The radiation prescription was entered and confirmed.   I have requested : Brachytherapy Isodose Plan and Dosimetry Calculations to plan the radiation distribution.    PLAN:  The patient will receive 5.5 Gy in 1 fraction directed at the [facility].    ________________________________  Blair Promise, PhD, MD

## 2015-08-12 ENCOUNTER — Encounter (HOSPITAL_BASED_OUTPATIENT_CLINIC_OR_DEPARTMENT_OTHER): Payer: Self-pay | Admitting: Radiation Oncology

## 2015-08-13 ENCOUNTER — Encounter (HOSPITAL_BASED_OUTPATIENT_CLINIC_OR_DEPARTMENT_OTHER): Payer: Self-pay | Admitting: *Deleted

## 2015-08-13 NOTE — Progress Notes (Addendum)
NPO AFTER MN.  ARRIVE AT 0600. NEEDS  ISTAT.  CURRENT EKG IN CHART AND EPIC. WILL TAKE AM MEDS WITH EXCEPTION NO HCTZ/KDUR W/ SIPS OF WATER.

## 2015-08-17 ENCOUNTER — Ambulatory Visit
Admission: RE | Admit: 2015-08-17 | Discharge: 2015-08-17 | Disposition: A | Discharge status: Home / Self Care | Payer: Medicare Other | Source: Ambulatory Visit | Attending: Radiation Oncology | Admitting: Radiation Oncology

## 2015-08-17 ENCOUNTER — Ambulatory Visit (HOSPITAL_COMMUNITY)
Admission: RE | Admit: 2015-08-17 | Discharge: 2015-08-17 | Disposition: A | Discharge status: Home / Self Care | Payer: Medicare Other | Source: Ambulatory Visit | Attending: Radiation Oncology | Admitting: Radiation Oncology

## 2015-08-17 ENCOUNTER — Ambulatory Visit (HOSPITAL_BASED_OUTPATIENT_CLINIC_OR_DEPARTMENT_OTHER): Payer: Medicare Other | Admitting: Anesthesiology

## 2015-08-17 ENCOUNTER — Encounter (HOSPITAL_BASED_OUTPATIENT_CLINIC_OR_DEPARTMENT_OTHER): Payer: Self-pay | Admitting: *Deleted

## 2015-08-17 ENCOUNTER — Encounter (HOSPITAL_BASED_OUTPATIENT_CLINIC_OR_DEPARTMENT_OTHER)
Admission: RE | Disposition: A | Discharge status: Home / Self Care | Payer: Self-pay | Source: Ambulatory Visit | Attending: Radiation Oncology

## 2015-08-17 ENCOUNTER — Other Ambulatory Visit: Payer: Self-pay | Admitting: Radiation Oncology

## 2015-08-17 ENCOUNTER — Ambulatory Visit (HOSPITAL_COMMUNITY): Payer: Medicare Other

## 2015-08-17 ENCOUNTER — Ambulatory Visit (HOSPITAL_BASED_OUTPATIENT_CLINIC_OR_DEPARTMENT_OTHER)
Admission: RE | Admit: 2015-08-17 | Discharge: 2015-08-17 | Disposition: A | Discharge status: Home / Self Care | Payer: Medicare Other | Source: Ambulatory Visit | Attending: Radiation Oncology | Admitting: Radiation Oncology

## 2015-08-17 VITALS — BP 119/61 | HR 73 | Temp 98.3°F | Resp 16

## 2015-08-17 DIAGNOSIS — M1990 Unspecified osteoarthritis, unspecified site: Secondary | ICD-10-CM | POA: Diagnosis not present

## 2015-08-17 DIAGNOSIS — C539 Malignant neoplasm of cervix uteri, unspecified: Secondary | ICD-10-CM

## 2015-08-17 DIAGNOSIS — Z87891 Personal history of nicotine dependence: Secondary | ICD-10-CM | POA: Insufficient documentation

## 2015-08-17 DIAGNOSIS — K219 Gastro-esophageal reflux disease without esophagitis: Secondary | ICD-10-CM | POA: Insufficient documentation

## 2015-08-17 DIAGNOSIS — Z510 Encounter for antineoplastic radiation therapy: Secondary | ICD-10-CM | POA: Diagnosis not present

## 2015-08-17 DIAGNOSIS — Z791 Long term (current) use of non-steroidal anti-inflammatories (NSAID): Secondary | ICD-10-CM | POA: Insufficient documentation

## 2015-08-17 DIAGNOSIS — E119 Type 2 diabetes mellitus without complications: Secondary | ICD-10-CM | POA: Insufficient documentation

## 2015-08-17 DIAGNOSIS — Z7982 Long term (current) use of aspirin: Secondary | ICD-10-CM | POA: Diagnosis not present

## 2015-08-17 DIAGNOSIS — E785 Hyperlipidemia, unspecified: Secondary | ICD-10-CM | POA: Diagnosis not present

## 2015-08-17 DIAGNOSIS — R350 Frequency of micturition: Secondary | ICD-10-CM

## 2015-08-17 DIAGNOSIS — I10 Essential (primary) hypertension: Secondary | ICD-10-CM | POA: Insufficient documentation

## 2015-08-17 DIAGNOSIS — Z8049 Family history of malignant neoplasm of other genital organs: Secondary | ICD-10-CM | POA: Insufficient documentation

## 2015-08-17 DIAGNOSIS — Z79899 Other long term (current) drug therapy: Secondary | ICD-10-CM | POA: Diagnosis not present

## 2015-08-17 DIAGNOSIS — C530 Malignant neoplasm of endocervix: Secondary | ICD-10-CM | POA: Insufficient documentation

## 2015-08-17 HISTORY — PX: TANDEM RING INSERTION: SHX6199

## 2015-08-17 LAB — URINALYSIS, MICROSCOPIC - CHCC
Bilirubin (Urine): NEGATIVE
Glucose: 100 mg/dL
KETONES: NEGATIVE mg/dL
Nitrite: NEGATIVE
SPECIFIC GRAVITY, URINE: 1.01 (ref 1.003–1.035)
Urobilinogen, UR: 0.2 mg/dL (ref 0.2–1)
pH: 6.5 (ref 4.6–8.0)

## 2015-08-17 LAB — POCT I-STAT 4, (NA,K, GLUC, HGB,HCT)
GLUCOSE: 141 mg/dL — AB (ref 65–99)
HEMATOCRIT: 37 % (ref 36.0–46.0)
Hemoglobin: 12.6 g/dL (ref 12.0–15.0)
Potassium: 3.8 mmol/L (ref 3.5–5.1)
SODIUM: 136 mmol/L (ref 135–145)

## 2015-08-17 SURGERY — INSERTION, UTERINE TANDEM AND RING OR CYLINDER, FOR BRACHYTHERAPY
Anesthesia: General

## 2015-08-17 MED ORDER — ONDANSETRON HCL 4 MG/2ML IJ SOLN
INTRAMUSCULAR | Status: DC | PRN
  Administered 2015-08-17: 4 mg via INTRAVENOUS

## 2015-08-17 MED ORDER — HYDROMORPHONE HCL 1 MG/ML IJ SOLN
0.5000 mg | Freq: Once | INTRAMUSCULAR | Status: AC
  Administered 2015-08-17: 0.5 mg via INTRAVENOUS
  Filled 2015-08-17: qty 1

## 2015-08-17 MED ORDER — LACTATED RINGERS IV SOLN
INTRAVENOUS | Status: DC
  Filled 2015-08-17: qty 1000

## 2015-08-17 MED ORDER — FENTANYL CITRATE (PF) 100 MCG/2ML IJ SOLN
INTRAMUSCULAR | Status: AC
  Filled 2015-08-17: qty 2

## 2015-08-17 MED ORDER — FENTANYL CITRATE (PF) 100 MCG/2ML IJ SOLN
INTRAMUSCULAR | Status: DC | PRN
  Administered 2015-08-17: 50 ug via INTRAVENOUS

## 2015-08-17 MED ORDER — ESTRADIOL 0.1 MG/GM VA CREA
TOPICAL_CREAM | VAGINAL | Status: DC | PRN
  Administered 2015-08-17: 1 via VAGINAL

## 2015-08-17 MED ORDER — FENTANYL CITRATE (PF) 100 MCG/2ML IJ SOLN
25.0000 ug | INTRAMUSCULAR | Status: DC | PRN
  Filled 2015-08-17: qty 1

## 2015-08-17 MED ORDER — LACTATED RINGERS IV SOLN
INTRAVENOUS | Status: DC
  Administered 2015-08-17: 07:00:00 via INTRAVENOUS
  Filled 2015-08-17: qty 1000

## 2015-08-17 MED ORDER — PROMETHAZINE HCL 25 MG/ML IJ SOLN
6.2500 mg | INTRAMUSCULAR | Status: DC | PRN
  Filled 2015-08-17: qty 1

## 2015-08-17 MED ORDER — MIDAZOLAM HCL 2 MG/2ML IJ SOLN
INTRAMUSCULAR | Status: AC
  Filled 2015-08-17: qty 2

## 2015-08-17 MED ORDER — WATER FOR IRRIGATION, STERILE IR SOLN
Status: DC | PRN
  Administered 2015-08-17: 200 mL via INTRAVESICAL

## 2015-08-17 MED ORDER — MEPERIDINE HCL 25 MG/ML IJ SOLN
6.2500 mg | INTRAMUSCULAR | Status: DC | PRN
  Filled 2015-08-17: qty 1

## 2015-08-17 MED ORDER — PROPOFOL 10 MG/ML IV BOLUS
INTRAVENOUS | Status: DC | PRN
  Administered 2015-08-17: 140 mg via INTRAVENOUS

## 2015-08-17 MED ORDER — MIDAZOLAM HCL 5 MG/5ML IJ SOLN
INTRAMUSCULAR | Status: DC | PRN
  Administered 2015-08-17: 1 mg via INTRAVENOUS

## 2015-08-17 MED ORDER — LIDOCAINE HCL (CARDIAC) 20 MG/ML IV SOLN
INTRAVENOUS | Status: DC | PRN
  Administered 2015-08-17: 60 mg via INTRAVENOUS

## 2015-08-17 MED ORDER — EPHEDRINE SULFATE 50 MG/ML IJ SOLN
INTRAMUSCULAR | Status: DC | PRN
  Administered 2015-08-17: 10 mg via INTRAVENOUS
  Administered 2015-08-17: 5 mg via INTRAVENOUS

## 2015-08-17 MED ORDER — DEXAMETHASONE SODIUM PHOSPHATE 4 MG/ML IJ SOLN
INTRAMUSCULAR | Status: DC | PRN
  Administered 2015-08-17: 10 mg via INTRAVENOUS

## 2015-08-17 SURGICAL SUPPLY — 31 items
BAG URINE DRAINAGE (UROLOGICAL SUPPLIES) ×3 IMPLANT
BNDG CONFORM 2 STRL LF (GAUZE/BANDAGES/DRESSINGS) IMPLANT
CATH SILICONE 16FRX5CC (CATHETERS) ×3 IMPLANT
CLOTH BEACON ORANGE TIMEOUT ST (SAFETY) ×3 IMPLANT
COVER BACK TABLE 60X90IN (DRAPES) ×3 IMPLANT
DRAPE LG THREE QUARTER DISP (DRAPES) ×3 IMPLANT
DRAPE UNDERBUTTOCKS STRL (DRAPE) ×3 IMPLANT
DRSG PAD ABDOMINAL 8X10 ST (GAUZE/BANDAGES/DRESSINGS) ×3 IMPLANT
GLOVE INDICATOR 7.5 STRL GRN (GLOVE) ×12 IMPLANT
GOWN STRL REUS W/ TWL LRG LVL3 (GOWN DISPOSABLE) ×2 IMPLANT
GOWN STRL REUS W/TWL LRG LVL3 (GOWN DISPOSABLE) ×4
HOLDER FOLEY CATH W/STRAP (MISCELLANEOUS) ×3 IMPLANT
LEGGING LITHOTOMY PAIR STRL (DRAPES) ×3 IMPLANT
NEEDLE SPNL 22GX3.5 QUINCKE BK (NEEDLE) IMPLANT
PACK BASIN DAY SURGERY FS (CUSTOM PROCEDURE TRAY) ×3 IMPLANT
PACKING VAGINAL (PACKING) IMPLANT
PAD ABD 8X10 STRL (GAUZE/BANDAGES/DRESSINGS) ×3 IMPLANT
PAD OB MATERNITY 4.3X12.25 (PERSONAL CARE ITEMS) ×3 IMPLANT
PAD PREP 24X48 CUFFED NSTRL (MISCELLANEOUS) ×3 IMPLANT
PLUG CATH AND CAP STER (CATHETERS) ×3 IMPLANT
SET IRRIG Y TYPE TUR BLADDER L (SET/KITS/TRAYS/PACK) ×3 IMPLANT
SUT PROLENE 0 SH 30 (SUTURE) IMPLANT
SUT SILK 2 0 30  PSL (SUTURE)
SUT SILK 2 0 30 PSL (SUTURE) IMPLANT
SYR BULB IRRIGATION 50ML (SYRINGE) IMPLANT
SYR CONTROL 10ML LL (SYRINGE) IMPLANT
SYRINGE 10CC LL (SYRINGE) ×3 IMPLANT
TOWEL OR 17X24 6PK STRL BLUE (TOWEL DISPOSABLE) ×6 IMPLANT
TRAY DSU PREP LF (CUSTOM PROCEDURE TRAY) ×3 IMPLANT
WATER STERILE IRR 3000ML UROMA (IV SOLUTION) ×3 IMPLANT
WATER STERILE IRR 500ML POUR (IV SOLUTION) ×3 IMPLANT

## 2015-08-17 NOTE — Progress Notes (Addendum)
Editor: Gery Pray, MD (Physician)     Expand All Collapse All    Radiation Oncology [phone] ________________________________  Name: Lori [name]: [id number] Date: 9/13/2016DOB: [birth year]   HDR BRACHYTHERAPY  DIAGNOSIS: Clinical stage IIB endocervical adenocarcinoma.  NARRATIVE: After planning was complete the patient was transferred to the high-dose-rate suite. She was placed in the treatment position. A fiducial marker was placed within the tandem ring system for imaging purposes.  Verification simulation note:  An AP and lateral film was obtained in the treatment position. This verified good position of the tandem/ring for treatment.  HDR brachytherapy treatment:  The remote afterloader was attached to the tandem/ring system by catheter system. The patient then proceeded to undergo her fourth high-dose-rate treatment directed at the cervical area. The patient was treated with iridium 192 as the high-dose-rate source. Patient was prescribed a dose of 5.5 gray to be delivered to the high risk CTV. This was achieved using 2 channels, that being the ring channel and the tandem channel. Total treatment time was 610.2 seconds. Patient tolerated her treatment well. After completion of her radiation therapy a survey was performed documenting return of the iridium source into the gamma med safe.  PLAN: Patient will return next week for her fifth high-dose-rate treatment. -----------------------------------  Blair Promise, PhD, MD

## 2015-08-17 NOTE — Op Note (Signed)
08/17/2015  8:43 AM  PATIENT:  Lori [name]  64 y.o. female  PRE-OPERATIVE DIAGNOSIS:  Cervical  CANCER   POST-OPERATIVE DIAGNOSIS:  cervical CANCER  PROCEDURE:  Procedure(s): TANDEM RING PLACEMENT  (N/A)  SURGEON:  Surgeon(s) and Role:    * Gery Pray, MD - Primary  PHYSICIAN ASSISTANT:   ASSISTANTS: none   ANESTHESIA:   general  EBL:  Total I/O In: 600 [I.V.:600] Out: 300 [Urine:300]  BLOOD ADMINISTERED:none  DRAINS: Urinary Catheter (Foley)   LOCAL MEDICATIONS USED:  NONE  SPECIMEN:  No Specimen  DISPOSITION OF SPECIMEN:  N/A  COUNTS:  YES  TOURNIQUET:  * No tourniquets in log *  DICTATION: Timeout was performed for the patient, procedure, antibiotic, allergy, and length of procedure.The patient was identified in the preoperative holding area. Informed consent was signed on the chart. Patient was seen history was reviewed and exam was performed. The patient was then taken to the operating room and placed in the supine position with SCD hose on. General anesthesia was then induced without difficulty. She was then placed in the dorsolithotomy position. The perineum was prepped with betadine. The vagina was prepped with betadine. The patient was then draped after the prep was dried. An in and out catheterization to empty the bladder was performed under sterile conditions . A urine specimen was submitted for culture and urinalysis and light of urinary frequency reported. A latex free Foley catheter was then placed. Exam under anesthesia revealed the cervix was noted to be flush with the vagina. The cervix was noted to be normal in size. The cervical sleeve was not present as this fell out with the tandem Ring removal on the patient's last treatment. The patient proceeded to undergo sounding of the uterus. The uterus sounded to ~ 8 cm on examination. Patient proceeded to undergo dilation of the cervical os. A 60 millimeter cervical sleeve was placed within the uterus and  cervix region. . Intraoperative Ultrasound Confirmed a Accurate Placement of the cervical sleeve. Patient then had placement of a 60 mm, 45 high-dose-rate tandem within the uterine cavity and cervical sleeve. The patient then had placement of a 45 ring with small shielding cap in place. This was affixed to the high-dose-rate tandem. The patient then had placement of a rectal paddle. Esterase cream was used to facilitate placment of the paddle.The rectal paddle was placed and secured to the tandem/ring apparatus. Accurate position of the tandem  was verified with intraoperative ultrasound. Patient tolerated the procedure well.  PLAN OF CARE: transfer to radiation oncology for planning and treatment  PATIENT DISPOSITION:  PACU - hemodynamically stable.   Delay start of Pharmacological VTE agent (>24hrs) due to surgical blood loss or risk of bleeding: not applicable

## 2015-08-17 NOTE — Transfer of Care (Signed)
Immediate Anesthesia Transfer of Care Note  Patient: Lori [name]  Procedure(s) Performed: Procedure(s) (LRB): TANDEM RING PLACEMENT  (N/A)  Patient Location: PACU  Anesthesia Type: General  Level of Consciousness: awake, oriented, sedated and patient cooperative  Airway & Oxygen Therapy: Patient Spontanous Breathing and Patient connected to face mask oxygen  Post-op Assessment: Report given to PACU RN and Post -op Vital signs reviewed and stable  Post vital signs: Reviewed and stable  Complications: No apparent anesthesia complications

## 2015-08-17 NOTE — Progress Notes (Signed)
  Radiation Oncology         [phone] ________________________________  Name: Lori [name] MRN: [id number]  Date: 08/17/2015  DOB: [birth year]  SIMULATION AND TREATMENT PLANNING NOTE HDR BRACHYTHERAPY  DIAGNOSIS:  Cervical cancer  NARRATIVE:  The patient was brought to the [facility].  Identity was confirmed.  All relevant records and images related to the planned course of therapy were reviewed.  The patient freely provided informed written consent to proceed with treatment after reviewing the details related to the planned course of therapy. The consent form was witnessed and verified by the simulation staff.  Then, the patient was set-up in a stable reproducible  supine position for radiation therapy.  CT images were obtained.  Surface markings were placed.  The CT images were loaded into the planning software.  Then the target and avoidance structures were contoured.  Treatment planning then occurred.  The radiation prescription was entered and confirmed.   I have requested : Brachytherapy Isodose Plan and Dosimetry Calculations to plan the radiation distribution.    PLAN:  The patient will receive 5.5 Gy in 1 fraction to high risk CTV.  ________________________________  Blair Promise, PhD, MD

## 2015-08-17 NOTE — Anesthesia Procedure Notes (Signed)
Procedure Name: LMA Insertion Date/Time: 08/17/2015 7:32 AM Performed by: Denna Haggard D Pre-anesthesia Checklist: Patient identified, Emergency Drugs available, Suction available and Patient being monitored Patient Re-evaluated:Patient Re-evaluated prior to inductionOxygen Delivery Method: Circle System Utilized Preoxygenation: Pre-oxygenation with 100% oxygen Intubation Type: IV induction Ventilation: Mask ventilation without difficulty LMA: LMA inserted LMA Size: 4.0 Number of attempts: 1 Airway Equipment and Method: Bite block Placement Confirmation: positive ETCO2 Tube secured with: Tape Dental Injury: Teeth and Oropharynx as per pre-operative assessment

## 2015-08-17 NOTE — Addendum Note (Signed)
Encounter addended by: Jacqulyn Liner, RN on: 08/17/2015  1:45 PM<BR>     Documentation filed: Orders, Dx Association, [facility]

## 2015-08-17 NOTE — Progress Notes (Signed)
Received report from Ivin Booty, [place] in PACU.  Patient has LR infusing to gravity to a #20 gauge IV in her left hand.  She has a foley catheter draining pale yellow urine.  Patient denies having any pain.  She was transported via stretcher to CT SIM with Gaspar Garbe, RN.

## 2015-08-17 NOTE — Interval H&P Note (Signed)
History and Physical Interval Note:  08/17/2015 7:35 AM  Lori [name]  has presented today for surgery, with the diagnosis of cervical CANCER   The various methods of treatment have been discussed with the patient and family. After consideration of risks, benefits and other options for treatment, the patient has consented to  Procedure(s): TANDEM RING PLACEMENT  (N/A) as a surgical intervention .  The patient's history has been reviewed, patient examined, no change in status, stable for surgery.  I have reviewed the patient's chart and labs.  Questions were answered to the patient's satisfaction.     Gery Pray

## 2015-08-17 NOTE — Anesthesia Preprocedure Evaluation (Signed)
Anesthesia Evaluation  Patient identified by MRN, date of birth, ID band Patient awake    Reviewed: Allergy & Precautions, NPO status , Patient's Chart, lab work & pertinent test results  History of Anesthesia Complications (+) PONV  Airway Mallampati: II  TM Distance: >3 FB Neck ROM: Full    Dental no notable dental hx. (+) Teeth Intact, Dental Advisory Given, Poor Dentition   Pulmonary neg pulmonary ROS, former smoker,    Pulmonary exam normal breath sounds clear to auscultation       Cardiovascular hypertension, Pt. on medications Normal cardiovascular exam Rhythm:Regular Rate:Normal     Neuro/Psych Depression S/p Crani for repair of cerebral aneurysm 1997 Denies any seizures negative neurological ROS  negative psych ROS   GI/Hepatic negative GI ROS, Neg liver ROS, GERD  Medicated and Controlled,  Endo/Other  negative endocrine ROSdiabetes, Type 2  Renal/GU negative Renal ROS  negative genitourinary   Musculoskeletal negative musculoskeletal ROS (+) Arthritis , Osteoarthritis,    Abdominal   Peds negative pediatric ROS (+)  Hematology negative hematology ROS (+)   Anesthesia Other Findings   Reproductive/Obstetrics negative OB ROS                             Anesthesia Physical  Anesthesia Plan  ASA: II  Anesthesia Plan: General   Post-op Pain Management:    Induction: Intravenous  Airway Management Planned: LMA  Additional Equipment:   Intra-op Plan:   Post-operative Plan: Extubation in OR  Informed Consent: I have reviewed the patients History and Physical, chart, labs and discussed the procedure including the risks, benefits and alternatives for the proposed anesthesia with the patient or authorized representative who has indicated his/her understanding and acceptance.   Dental advisory given  Plan Discussed with: CRNA, Surgeon and Anesthesiologist  Anesthesia  Plan Comments:         Anesthesia Quick Evaluation

## 2015-08-17 NOTE — Addendum Note (Signed)
Encounter addended by: Jacqulyn Liner, RN on: 08/17/2015  2:00 PM<BR>     Documentation filed: Vitals Section

## 2015-08-17 NOTE — Addendum Note (Signed)
Encounter addended by: Jacqulyn Liner, RN on: 08/17/2015  1:55 PM<BR>     Documentation filed: Notes Section, Inpatient Document Flowsheet

## 2015-08-17 NOTE — Progress Notes (Signed)
Genavie is back from CT SIM.  LR is now infusing at 125 ml/hr.  Call light in reach.  Will continue to monitor.

## 2015-08-17 NOTE — Progress Notes (Signed)
IMMEDIATELY FOLLOWING SURGERY: Do not drive or operate machinery for the first twenty four hours after surgery. Do not make any important decisions for twenty four hours after surgery or while taking narcotic pain medications or sedatives. If you develop intractable nausea and vomiting or a severe headache please notify your doctor immediately.   FOLLOW-UP: You do not need to follow up with anesthesia unless specifically instructed to do so.   WOUND CARE INSTRUCTIONS (if applicable): Expect some mild vaginal bleeding, but if large amount of bleeding occurs please contact Dr. Sondra Come at [phone] or the Radiation On-Call physician. Call for any fever greater than 101.0 degrees or increasing vaginal//abdominal pain or trouble urinating.   QUESTIONS?: Please feel free to call your physician or the hospital operator if you have any questions, and they will be happy to assist you. Resume all medications: as listed on your after visit summary. Your next appointment is:  Future Appointments Date Time Provider [place]  08/26/2015 7:00 AM [facility]-US 1 [facility]-US Home  08/26/2015 9:00 AM Gery Pray, MD [facility] None  08/26/2015 1:30 PM Gery Pray, MD [facility] None

## 2015-08-17 NOTE — H&P (View-Only) (Signed)
Radiation Oncology         [phone] ________________________________  History and physical examination note  Name: Lori [name] MRN: [id number]  Date: 07/21/2015  DOB: 04/28/1951  CC:SHAH,ASHISH, MD  No ref. provider found   REFERRING PHYSICIAN: Rossi, Emma, MD  DIAGNOSIS: clinical stage IIB endocervical adenocarcinoma.  HISTORY OF PRESENT ILLNESS::Lori [name] is a 63 y.o. female who is  diagnosed with clinical stage II-B endocervical adenocarcinoma. She has been found to have left parametrial filtration on examination and an eroded cervix. Patient is been receiving radiosensitizing chemotherapy along with pelvic radiation therapy in the [facility]. Patient has completed approximately 25 treatments as of yesterday. She will be taken to the operating room on August 24 for exam under anesthesia and placement of cervical sleeve under the direction of Dr. Rossi as well as placement of tandem and ring for high-dose rate radiation therapy treatments.   PAST MEDICAL HISTORY:  has a past medical history of Depression; Hypertension; Hyperlipidemia; History of cerebral aneurysm repair; PMB (postmenopausal bleeding); History of seizures; Type 2 diabetes, diet controlled; GERD (gastroesophageal reflux disease); Arthritis; Endometrial carcinoma; and Cervical carcinoma (oncologist--  dr Lateasha Breuer for radiation/   [facility] for chemotherapy).    PAST SURGICAL HISTORY: Past Surgical History  Procedure Laterality Date  . Craniotomy posterior fossa  1997    Repair aneurysm  x2  left side  . Port-a-cath placement  06/  2016    FAMILY HISTORY: family history includes Cervical cancer in her sister; Throat cancer in her father.  SOCIAL HISTORY:  reports that she quit smoking about 19 years ago. Her smoking use included Cigarettes. She quit after 5 years of use. She has never used smokeless tobacco. She reports that she does not drink alcohol or use illicit drugs.  ALLERGIES:  Contrast media; Dilantin; Latex; and Adhesive  MEDICATIONS:  No current facility-administered medications for this encounter.   Current Outpatient Prescriptions  Medication Sig Dispense Refill  . amLODipine (NORVASC) 10 MG tablet Take 10 mg by mouth every morning.   5  . aspirin 81 MG tablet Take 81 mg by mouth daily.    . benazepril (LOTENSIN) 40 MG tablet Take 40 mg by mouth every morning.   5  . carbamazepine (TEGRETOL) 100 MG chewable tablet Chew 100 mg by mouth 3 (three) times daily.   5  . cloNIDine (CATAPRES) 0.2 MG tablet Take 0.2 mg by mouth 3 (three) times daily.   6  . gabapentin (NEURONTIN) 300 MG capsule Take 300 mg by mouth 2 (two) times daily.   5  . hydrochlorothiazide (HYDRODIURIL) 25 MG tablet Take 25 mg by mouth every morning.   11  . ibuprofen (ADVIL,MOTRIN) 400 MG tablet Take 400 mg by mouth every 6 (six) hours as needed.   0  . loperamide (IMODIUM A-D) 2 MG tablet Take 2 mg by mouth 4 (four) times daily as needed for diarrhea or loose stools.    . metoprolol (LOPRESSOR) 50 MG tablet Take by mouth 2 (two) times daily. 1.5 tablets by mouth in the morning, 1 tablet in the evening  5  . potassium chloride SA (K-DUR,KLOR-CON) 20 MEQ tablet Take 20 mEq by mouth 2 (two) times daily.   6  . pravastatin (PRAVACHOL) 20 MG tablet Take 20 mg by mouth every evening.   2    REVIEW OF SYSTEMS:  A 15 point review of systems is documented in the electronic medical record. This was obtained by the nursing staff. However, I   reviewed this with the patient to discuss relevant findings and make appropriate changes.  Patient has had problems with diarrhea with her external beam treatments. This is managed well with Imodium she denies any nausea. Has some vaginal discharge but no active bleeding at this time.   PHYSICAL EXAM:  height is 5' 8" (1.727 m) and weight is 197 lb (89.359 kg).  this is a very pleasant 63-year-old female in no acute distress. She has left-sided weakness from prior  complications related to cerebral aneurysm vital signs at the [facility] on August 22 showed a temperature of 98.1 pulse of 76 respirations 18 blood pressure 141/68 oxygen saturation 98% on room air. Examination of the oral cavity reveals no secondary infection. No palpable adenopathy in the neck or supraclavicular region. The lungs are clear to auscultation. The heart has a regular rhythm and rate. The abdomen is soft and nontender with normal bowel sounds. Pelvic exam is deferred until intraoperative procedure. Extremities show good peripheral pulses. Mild edema and ankle and foot areas. The left-sided weakness. The patient ambulates with the assistance of a cane.    LABORATORY DATA:  To be scanned in from [facility],  August 16 white count 6.4 hemoglobin 10.0 hematocrit 29.7 platelet count 148,000 and absolute neutrophil count 4.9, glucose 120 BUN 13 creatinine 0.77 calcium 9.0 magnesium 1.5      IMPRESSION: Stage II-B endocervical adenocarcinoma. Patient will proceed with brachytherapy treatments using iridium 192 as the high-dose-rate source. Operative procedure scheduled for August 24 for placement of tandem ring in preparation for high-dose rate radiation therapy.    ------------------------------------------------  Ryelynn Guedea D. Kirkland Figg, PhD, MD         

## 2015-08-17 NOTE — Progress Notes (Signed)
Lori [name] was picked up by her family.

## 2015-08-17 NOTE — Anesthesia Postprocedure Evaluation (Signed)
  Anesthesia Post-op Note  Patient: Lori [name]  Procedure(s) Performed: Procedure(s) (LRB): TANDEM RING PLACEMENT  (N/A)  Patient Location: PACU  Anesthesia Type: General  Level of Consciousness: awake and alert   Airway and Oxygen Therapy: Patient Spontanous Breathing  Post-op Pain: mild  Post-op Assessment: Post-op Vital signs reviewed, Patient's Cardiovascular Status Stable, Respiratory Function Stable, Patent Airway and No signs of Nausea or vomiting  Last Vitals:  Filed Vitals:   08/17/15 0915  BP: 125/67  Pulse: 65  Temp:   Resp: 9    Post-op Vital Signs: stable   Complications: No apparent anesthesia complications

## 2015-08-17 NOTE — Addendum Note (Signed)
Encounter addended by: Jacqulyn Liner, RN on: 08/17/2015  1:35 PM<BR>     Documentation filed: Vitals Section

## 2015-08-17 NOTE — Progress Notes (Signed)
Foley catheter removed intact.  Discharge paperwork given.  Patient waiting for family member to pick her up.  Call light in reach.

## 2015-08-17 NOTE — Addendum Note (Signed)
Encounter addended by: Gery Pray, MD on: 08/17/2015  5:40 PM<BR>     Documentation filed: Notes Section

## 2015-08-18 ENCOUNTER — Encounter (HOSPITAL_BASED_OUTPATIENT_CLINIC_OR_DEPARTMENT_OTHER): Payer: Self-pay | Admitting: Radiation Oncology

## 2015-08-18 LAB — URINE CULTURE: Culture: NO GROWTH

## 2015-08-19 ENCOUNTER — Encounter (HOSPITAL_BASED_OUTPATIENT_CLINIC_OR_DEPARTMENT_OTHER): Payer: Self-pay | Admitting: *Deleted

## 2015-08-19 LAB — URINE CULTURE

## 2015-08-19 NOTE — Progress Notes (Signed)
NPO AFTER MN.  ARRIVE AT 0600.  CURRENT ISTAT AND EKG IN CHART AND EPIC. WILL TAKE AM MEDS W/ EXCEPTION NO HCTZ/ KDUR DOS W/ SIPS OF WATER.

## 2015-08-23 ENCOUNTER — Encounter (HOSPITAL_BASED_OUTPATIENT_CLINIC_OR_DEPARTMENT_OTHER): Payer: Self-pay | Admitting: Radiation Oncology

## 2015-08-26 ENCOUNTER — Ambulatory Visit (HOSPITAL_COMMUNITY): Payer: Medicare Other

## 2015-08-26 ENCOUNTER — Encounter (HOSPITAL_BASED_OUTPATIENT_CLINIC_OR_DEPARTMENT_OTHER): Payer: Self-pay | Admitting: *Deleted

## 2015-08-26 ENCOUNTER — Ambulatory Visit (HOSPITAL_BASED_OUTPATIENT_CLINIC_OR_DEPARTMENT_OTHER): Payer: Medicare Other | Admitting: Anesthesiology

## 2015-08-26 ENCOUNTER — Ambulatory Visit (HOSPITAL_COMMUNITY)
Admission: RE | Admit: 2015-08-26 | Discharge: 2015-08-26 | Disposition: A | Discharge status: Home / Self Care | Payer: Medicare Other | Source: Ambulatory Visit | Attending: Radiation Oncology | Admitting: Radiation Oncology

## 2015-08-26 ENCOUNTER — Ambulatory Visit
Admission: RE | Admit: 2015-08-26 | Discharge: 2015-08-26 | Disposition: A | Discharge status: Home / Self Care | Payer: Medicare Other | Source: Ambulatory Visit | Attending: Radiation Oncology | Admitting: Radiation Oncology

## 2015-08-26 ENCOUNTER — Encounter (HOSPITAL_BASED_OUTPATIENT_CLINIC_OR_DEPARTMENT_OTHER)
Admission: RE | Disposition: A | Discharge status: Home / Self Care | Payer: Self-pay | Source: Ambulatory Visit | Attending: Radiation Oncology

## 2015-08-26 ENCOUNTER — Ambulatory Visit (HOSPITAL_BASED_OUTPATIENT_CLINIC_OR_DEPARTMENT_OTHER)
Admission: RE | Admit: 2015-08-26 | Discharge: 2015-08-26 | Disposition: A | Discharge status: Home / Self Care | Payer: Medicare Other | Source: Ambulatory Visit | Attending: Radiation Oncology | Admitting: Radiation Oncology

## 2015-08-26 ENCOUNTER — Encounter: Payer: Self-pay | Admitting: Radiation Oncology

## 2015-08-26 VITALS — BP 122/68 | HR 74

## 2015-08-26 VITALS — BP 115/65 | HR 98 | Temp 98.4°F | Ht 68.0 in

## 2015-08-26 DIAGNOSIS — Z79899 Other long term (current) drug therapy: Secondary | ICD-10-CM | POA: Insufficient documentation

## 2015-08-26 DIAGNOSIS — Z791 Long term (current) use of non-steroidal anti-inflammatories (NSAID): Secondary | ICD-10-CM | POA: Diagnosis not present

## 2015-08-26 DIAGNOSIS — C539 Malignant neoplasm of cervix uteri, unspecified: Secondary | ICD-10-CM

## 2015-08-26 DIAGNOSIS — Z8049 Family history of malignant neoplasm of other genital organs: Secondary | ICD-10-CM | POA: Insufficient documentation

## 2015-08-26 DIAGNOSIS — Z87891 Personal history of nicotine dependence: Secondary | ICD-10-CM | POA: Insufficient documentation

## 2015-08-26 DIAGNOSIS — I10 Essential (primary) hypertension: Secondary | ICD-10-CM | POA: Diagnosis not present

## 2015-08-26 DIAGNOSIS — R569 Unspecified convulsions: Secondary | ICD-10-CM | POA: Diagnosis not present

## 2015-08-26 DIAGNOSIS — C530 Malignant neoplasm of endocervix: Secondary | ICD-10-CM | POA: Diagnosis present

## 2015-08-26 DIAGNOSIS — K219 Gastro-esophageal reflux disease without esophagitis: Secondary | ICD-10-CM | POA: Diagnosis not present

## 2015-08-26 DIAGNOSIS — E119 Type 2 diabetes mellitus without complications: Secondary | ICD-10-CM | POA: Diagnosis not present

## 2015-08-26 DIAGNOSIS — Z7982 Long term (current) use of aspirin: Secondary | ICD-10-CM | POA: Diagnosis not present

## 2015-08-26 DIAGNOSIS — Z808 Family history of malignant neoplasm of other organs or systems: Secondary | ICD-10-CM | POA: Insufficient documentation

## 2015-08-26 DIAGNOSIS — E785 Hyperlipidemia, unspecified: Secondary | ICD-10-CM | POA: Insufficient documentation

## 2015-08-26 DIAGNOSIS — M1990 Unspecified osteoarthritis, unspecified site: Secondary | ICD-10-CM | POA: Diagnosis not present

## 2015-08-26 DIAGNOSIS — Z510 Encounter for antineoplastic radiation therapy: Secondary | ICD-10-CM | POA: Diagnosis not present

## 2015-08-26 HISTORY — PX: TANDEM RING INSERTION: SHX6199

## 2015-08-26 LAB — URINALYSIS, ROUTINE W REFLEX MICROSCOPIC
BILIRUBIN URINE: NEGATIVE
GLUCOSE, UA: NEGATIVE mg/dL
KETONES UR: NEGATIVE mg/dL
Nitrite: NEGATIVE
Specific Gravity, Urine: 1.026 (ref 1.005–1.030)
UROBILINOGEN UA: 1 mg/dL (ref 0.0–1.0)
pH: 6 (ref 5.0–8.0)

## 2015-08-26 LAB — GLUCOSE, CAPILLARY
Glucose-Capillary: 119 mg/dL — ABNORMAL HIGH (ref 65–99)
Glucose-Capillary: 114 mg/dL — ABNORMAL HIGH (ref 65–99)

## 2015-08-26 LAB — URINE MICROSCOPIC-ADD ON

## 2015-08-26 SURGERY — INSERTION, UTERINE TANDEM AND RING OR CYLINDER, FOR BRACHYTHERAPY
Anesthesia: General | Site: Vagina

## 2015-08-26 MED ORDER — LIDOCAINE HCL (CARDIAC) 20 MG/ML IV SOLN
INTRAVENOUS | Status: DC | PRN
  Administered 2015-08-26: 80 mg via INTRAVENOUS

## 2015-08-26 MED ORDER — OXYCODONE HCL 5 MG/5ML PO SOLN
5.0000 mg | Freq: Once | ORAL | Status: DC | PRN
  Filled 2015-08-26: qty 5

## 2015-08-26 MED ORDER — WATER FOR IRRIGATION, STERILE IR SOLN
Status: DC | PRN
  Administered 2015-08-26: 3000 mL

## 2015-08-26 MED ORDER — HYDROMORPHONE HCL 1 MG/ML IJ SOLN
1.0000 mg | Freq: Once | INTRAMUSCULAR | Status: AC
  Administered 2015-08-26: 1 mg via INTRAVENOUS
  Filled 2015-08-26: qty 1

## 2015-08-26 MED ORDER — DEXAMETHASONE SODIUM PHOSPHATE 4 MG/ML IJ SOLN
INTRAMUSCULAR | Status: DC | PRN
  Administered 2015-08-26: 10 mg via INTRAVENOUS

## 2015-08-26 MED ORDER — FENTANYL CITRATE (PF) 100 MCG/2ML IJ SOLN
25.0000 ug | INTRAMUSCULAR | Status: DC | PRN
  Filled 2015-08-26: qty 1

## 2015-08-26 MED ORDER — PROPOFOL 10 MG/ML IV BOLUS
INTRAVENOUS | Status: DC | PRN
  Administered 2015-08-26: 180 mg via INTRAVENOUS

## 2015-08-26 MED ORDER — MIDAZOLAM HCL 2 MG/2ML IJ SOLN
INTRAMUSCULAR | Status: AC
  Filled 2015-08-26: qty 2

## 2015-08-26 MED ORDER — FENTANYL CITRATE (PF) 100 MCG/2ML IJ SOLN
INTRAMUSCULAR | Status: DC | PRN
  Administered 2015-08-26: 100 ug via INTRAVENOUS

## 2015-08-26 MED ORDER — LACTATED RINGERS IV SOLN
INTRAVENOUS | Status: DC
  Administered 2015-08-26: 07:00:00 via INTRAVENOUS
  Filled 2015-08-26: qty 1000

## 2015-08-26 MED ORDER — FENTANYL CITRATE (PF) 100 MCG/2ML IJ SOLN
INTRAMUSCULAR | Status: AC
Start: 2015-08-26 — End: 2015-08-26
  Filled 2015-08-26: qty 2

## 2015-08-26 MED ORDER — ONDANSETRON HCL 4 MG/2ML IJ SOLN
4.0000 mg | Freq: Four times a day (QID) | INTRAMUSCULAR | Status: DC | PRN
  Filled 2015-08-26: qty 2

## 2015-08-26 MED ORDER — OXYCODONE HCL 5 MG PO TABS
5.0000 mg | ORAL_TABLET | Freq: Once | ORAL | Status: DC | PRN
  Filled 2015-08-26: qty 1

## 2015-08-26 MED ORDER — LACTATED RINGERS IV SOLN
INTRAVENOUS | Status: DC
  Administered 2015-08-26: 13:00:00 via INTRAVENOUS
  Filled 2015-08-26: qty 250

## 2015-08-26 SURGICAL SUPPLY — 37 items
BAG URINE DRAINAGE (UROLOGICAL SUPPLIES) ×3 IMPLANT
BNDG CONFORM 2 STRL LF (GAUZE/BANDAGES/DRESSINGS) IMPLANT
CATH FOLEY 2WAY SLVR  5CC 16FR (CATHETERS)
CATH FOLEY 2WAY SLVR 5CC 16FR (CATHETERS) IMPLANT
CATH SILICONE 16FRX5CC (CATHETERS) ×3 IMPLANT
CLOTH BEACON ORANGE TIMEOUT ST (SAFETY) ×3 IMPLANT
COVER BACK TABLE 60X90IN (DRAPES) ×3 IMPLANT
DRAPE LG THREE QUARTER DISP (DRAPES) ×3 IMPLANT
DRAPE UNDERBUTTOCKS STRL (DRAPE) ×3 IMPLANT
DRSG PAD ABDOMINAL 8X10 ST (GAUZE/BANDAGES/DRESSINGS) ×3 IMPLANT
GLOVE BIO SURGEON STRL SZ7.5 (GLOVE) IMPLANT
GLOVE BIOGEL PI IND STRL 7.5 (GLOVE) ×2 IMPLANT
GLOVE BIOGEL PI INDICATOR 7.5 (GLOVE) ×4
GLOVE SURG SS PI 7.5 STRL IVOR (GLOVE) ×9 IMPLANT
GOWN STRL REUS W/ TWL LRG LVL3 (GOWN DISPOSABLE) ×2 IMPLANT
GOWN STRL REUS W/TWL LRG LVL3 (GOWN DISPOSABLE) ×4
HOLDER FOLEY CATH W/STRAP (MISCELLANEOUS) ×3 IMPLANT
LEGGING LITHOTOMY PAIR STRL (DRAPES) ×3 IMPLANT
MANIFOLD NEPTUNE II (INSTRUMENTS) IMPLANT
NEEDLE SPNL 22GX3.5 QUINCKE BK (NEEDLE) IMPLANT
PACK BASIN DAY SURGERY FS (CUSTOM PROCEDURE TRAY) ×3 IMPLANT
PACKING VAGINAL (PACKING) IMPLANT
PAD ABD 8X10 STRL (GAUZE/BANDAGES/DRESSINGS) ×3 IMPLANT
PAD OB MATERNITY 4.3X12.25 (PERSONAL CARE ITEMS) ×3 IMPLANT
PAD PREP 24X48 CUFFED NSTRL (MISCELLANEOUS) ×3 IMPLANT
PLUG CATH AND CAP STER (CATHETERS) ×3 IMPLANT
SET IRRIG Y TYPE TUR BLADDER L (SET/KITS/TRAYS/PACK) ×3 IMPLANT
SUT PROLENE 0 SH 30 (SUTURE) IMPLANT
SUT SILK 2 0 30  PSL (SUTURE)
SUT SILK 2 0 30 PSL (SUTURE) IMPLANT
SYR BULB IRRIGATION 50ML (SYRINGE) IMPLANT
SYR CONTROL 10ML LL (SYRINGE) IMPLANT
SYRINGE 10CC LL (SYRINGE) ×3 IMPLANT
TOWEL OR 17X24 6PK STRL BLUE (TOWEL DISPOSABLE) ×6 IMPLANT
TRAY DSU PREP LF (CUSTOM PROCEDURE TRAY) ×3 IMPLANT
WATER STERILE IRR 3000ML UROMA (IV SOLUTION) ×3 IMPLANT
WATER STERILE IRR 500ML POUR (IV SOLUTION) ×3 IMPLANT

## 2015-08-26 NOTE — Anesthesia Procedure Notes (Signed)
Procedure Name: LMA Insertion Date/Time: 08/26/2015 7:36 AM Performed by: Wanita Chamberlain Pre-anesthesia Checklist: Patient identified, Timeout performed, Emergency Drugs available, Suction available and Patient being monitored Patient Re-evaluated:Patient Re-evaluated prior to inductionOxygen Delivery Method: Circle system utilized Preoxygenation: Pre-oxygenation with 100% oxygen Intubation Type: IV induction Ventilation: Mask ventilation without difficulty LMA: LMA inserted LMA Size: 4.0 Number of attempts: 1 Placement Confirmation: positive ETCO2 and breath sounds checked- equal and bilateral Tube secured with: Tape Dental Injury: Teeth and Oropharynx as per pre-operative assessment

## 2015-08-26 NOTE — Progress Notes (Signed)
Neil reports she is not having any pain.  Assisted Dr. Sondra Come with equipment removal.  Patient tolerated well.  Patient's right wrist IV was removed intact by Anderson Malta, RN.  Patient given discharge instructions and paperwork.  She is resting in the chair waiting for her family to pick her up.

## 2015-08-26 NOTE — Progress Notes (Signed)
  Radiation Oncology         [phone] ________________________________  Name: Lori [name] MRN: [id number]  Date: 08/26/2015  DOB: [birth year]  SIMULATION AND TREATMENT PLANNING NOTE HDR BRACHYTHERAPY  DIAGNOSIS:  Cervical cancer  NARRATIVE:  The patient was brought to the [facility].  Identity was confirmed.  All relevant records and images related to the planned course of therapy were reviewed.  The patient freely provided informed written consent to proceed with treatment after reviewing the details related to the planned course of therapy. The consent form was witnessed and verified by the simulation staff.  Then, the patient was set-up in a stable reproducible  supine position for radiation therapy.  CT images were obtained.  Surface markings were placed.  The CT images were loaded into the planning software.  Then the target and avoidance structures were contoured.  Treatment planning then occurred.  The radiation prescription was entered and confirmed.   I have requested : Brachytherapy Isodose Plan and Dosimetry Calculations to plan the radiation distribution.    PLAN:  The patient will receive 5.5 Gy in 1 fraction to high risk CTV.  ________________________________  Blair Promise, PhD, MD

## 2015-08-26 NOTE — Progress Notes (Addendum)
Ms. Albaugh resting comfortably while lying on stretcher s/p insertion of HDR.   Currently denies any pain.  Foley with clear yellow urine and emptied of 750cc.  IV intact in right, anterior wrist. Infusing at 100 cc per hour as approved by Dr. Gery Pray.  PAS Hose functional.

## 2015-08-26 NOTE — Progress Notes (Signed)
Patient reports pain at a 4/10 in her vaginal area.  She was given 1 mg dilaudid IV per Dr. Sondra Come.

## 2015-08-26 NOTE — Op Note (Signed)
08/26/2015  8:53 AM  PATIENT:  Lori [name]  64 y.o. female  PRE-OPERATIVE DIAGNOSIS:  Cervical  CANCER  POST-OPERATIVE DIAGNOSIS:  Cervical  CANCER  PROCEDURE:  Procedure(s): TANDEM RING PLACEMENT  (N/A)  SURGEON:  Surgeon(s) and Role:    * Gery Pray, MD - Primary  PHYSICIAN ASSISTANT:   ASSISTANTS: none   ANESTHESIA:   general  EBL:  Total I/O In: 550 [I.V.:550] Out: 50 [Urine:50]  BLOOD ADMINISTERED:none  DRAINS: Urinary Catheter (Foley)   LOCAL MEDICATIONS USED:  NONE  SPECIMEN:  No Specimen, cath urine for UA, C&S  DISPOSITION OF SPECIMEN:  N/A  COUNTS:  YES  TOURNIQUET:  * No tourniquets in log *  DICTATION: Timeout was performed for the patient, procedure, antibiotic, allergy, and length of procedure.The patient was identified in the preoperative holding area. Informed consent was signed on the chart. Patient was seen history was reviewed and exam was performed. The patient was then taken to the operating room and placed in the supine position with SCD hose on. General anesthesia was then induced without difficulty. She was then placed in the dorsolithotomy position. The perineum was prepped with betadine. The vagina was prepped with betadine. The patient was then draped after the prep was dried. An in and out catheterization to empty the bladder was performed under sterile conditions . A urine specimen was submitted for culture and urinalysis and light of urinary frequency reported. A latex free Foley catheter was then placed. Exam under anesthesia revealed the cervix was noted to be flush with the vagina. The cervix was noted to be normal in size. Radiation changes were noted in the proximal vagina but no ulceration. The cervical sleeve remained in place from last week's procedure. The patient proceeded to undergo sounding of the uterus. The uterus sounded to ~ 8 cm on examination. A 60 millimeter cervical sleeve remained within the uterus and cervix region.   Intraoperative Ultrasound Confirmed a Accurate Placement of the cervical sleeve. Patient then had placement of a 60 mm, 60 high-dose-rate tandem within the uterine cavity and cervical sleeve. The patient then had placement of a 60 degree ring with small shielding cap in place. This was affixed to the high-dose-rate tandem. The patient then had placement of a rectal paddle. .The rectal paddle was placed and secured to the tandem/ring apparatus. Accurate position of the tandem was verified with intraoperative ultrasound. Patient tolerated the procedure well.  PLAN OF CARE: Transferred to radiation oncology for planning and treatment  PATIENT DISPOSITION:  PACU - hemodynamically stable.   Delay start of Pharmacological VTE agent (>24hrs) due to surgical blood loss or risk of bleeding: not applicable

## 2015-08-26 NOTE — Addendum Note (Signed)
Encounter addended by: Benn Moulder, RN on: 08/26/2015 12:35 PM<BR>     Documentation filed: Inpatient Document Flowsheet, Notes Section, [place] Section, Chief Complaint Section

## 2015-08-26 NOTE — Addendum Note (Signed)
Encounter addended by: Jacqulyn Liner, RN on: 08/26/2015  2:37 PM<BR>     Documentation filed: Dx Association, Orders

## 2015-08-26 NOTE — Interval H&P Note (Signed)
History and Physical Interval Note:  08/26/2015 7:25 AM  Lori [name]  has presented today for surgery, with the diagnosis of cervical CANCER  The various methods of treatment have been discussed with the patient and family. After consideration of risks, benefits and other options for treatment, the patient has consented to  Procedure(s): TANDEM RING PLACEMENT  (N/A) as a surgical intervention .  The patient's history has been reviewed, patient examined, no change in status, stable for surgery.  I have reviewed the patient's chart and labs.  Questions were answered to the patient's satisfaction.     Gery Pray

## 2015-08-26 NOTE — Addendum Note (Signed)
Encounter addended by: Jacqulyn Liner, RN on: 08/26/2015  2:38 PM<BR>     Documentation filed: Inpatient MAR

## 2015-08-26 NOTE — Addendum Note (Signed)
Encounter addended by: Neysa Hotter, [facility] on: 08/26/2015 12:59 PM<BR>     Documentation filed: Rx Order Verification

## 2015-08-26 NOTE — Anesthesia Postprocedure Evaluation (Signed)
Anesthesia Post Note  Patient: Lori [name]  Procedure(s) Performed: Procedure(s) (LRB): TANDEM RING PLACEMENT  (N/A)  Anesthesia type: General  Patient location: PACU  Post pain: Pain level controlled and Adequate analgesia  Post assessment: Post-op Vital signs reviewed, Patient's Cardiovascular Status Stable, Respiratory Function Stable, Patent Airway and Pain level controlled  Last Vitals:  Filed Vitals:   08/26/15 0900  BP: 111/66  Pulse: 62  Temp:   Resp: 14    Post vital signs: Reviewed and stable  Level of consciousness: awake, alert  and oriented  Complications: No apparent anesthesia complications

## 2015-08-26 NOTE — Progress Notes (Signed)
Received report from [place], [place] in PACU. Patient has foley catheter draining yellow urine.  She has a #22 gauge IV in her right wrist with LR infusing to gravity.  Transported her to CT SIM with Vivien Rota, Nurse Transporter.

## 2015-08-26 NOTE — H&P (View-Only) (Signed)
Radiation Oncology         [phone] ________________________________  History and physical examination note  Name: Lori [name] MRN: [id number]  Date: 07/21/2015  DOB: 03/10/1951  CC:SHAH,ASHISH, MD  No ref. provider found   REFERRING PHYSICIAN: Rossi, Emma, MD  DIAGNOSIS: clinical stage IIB endocervical adenocarcinoma.  HISTORY OF PRESENT ILLNESS::Lori [name] is a 63 y.o. female who is  diagnosed with clinical stage II-B endocervical adenocarcinoma. She has been found to have left parametrial filtration on examination and an eroded cervix. Patient is been receiving radiosensitizing chemotherapy along with pelvic radiation therapy in the [facility]. Patient has completed approximately 25 treatments as of yesterday. She will be taken to the operating room on August 24 for exam under anesthesia and placement of cervical sleeve under the direction of Dr. Rossi as well as placement of tandem and ring for high-dose rate radiation therapy treatments.   PAST MEDICAL HISTORY:  has a past medical history of Depression; Hypertension; Hyperlipidemia; History of cerebral aneurysm repair; PMB (postmenopausal bleeding); History of seizures; Type 2 diabetes, diet controlled; GERD (gastroesophageal reflux disease); Arthritis; Endometrial carcinoma; and Cervical carcinoma (oncologist--  dr [facility] for radiation/   [facility] for chemotherapy).    PAST SURGICAL HISTORY: Past Surgical History  Procedure Laterality Date  . Craniotomy posterior fossa  1997    Repair aneurysm  x2  left side  . Port-a-cath placement  06/  2016    FAMILY HISTORY: family history includes Cervical cancer in her sister; Throat cancer in her father.  SOCIAL HISTORY:  reports that she quit smoking about 19 years ago. Her smoking use included Cigarettes. She quit after 5 years of use. She has never used smokeless tobacco. She reports that she does not drink alcohol or use illicit drugs.  ALLERGIES:  Contrast media; Dilantin; Latex; and Adhesive  MEDICATIONS:  No current facility-administered medications for this encounter.   Current Outpatient Prescriptions  Medication Sig Dispense Refill  . amLODipine (NORVASC) 10 MG tablet Take 10 mg by mouth every morning.   5  . aspirin 81 MG tablet Take 81 mg by mouth daily.    . benazepril (LOTENSIN) 40 MG tablet Take 40 mg by mouth every morning.   5  . carbamazepine (TEGRETOL) 100 MG chewable tablet Chew 100 mg by mouth 3 (three) times daily.   5  . cloNIDine (CATAPRES) 0.2 MG tablet Take 0.2 mg by mouth 3 (three) times daily.   6  . gabapentin (NEURONTIN) 300 MG capsule Take 300 mg by mouth 2 (two) times daily.   5  . hydrochlorothiazide (HYDRODIURIL) 25 MG tablet Take 25 mg by mouth every morning.   11  . ibuprofen (ADVIL,MOTRIN) 400 MG tablet Take 400 mg by mouth every 6 (six) hours as needed.   0  . loperamide (IMODIUM A-D) 2 MG tablet Take 2 mg by mouth 4 (four) times daily as needed for diarrhea or loose stools.    . metoprolol (LOPRESSOR) 50 MG tablet Take by mouth 2 (two) times daily. 1.5 tablets by mouth in the morning, 1 tablet in the evening  5  . potassium chloride SA (K-DUR,KLOR-CON) 20 MEQ tablet Take 20 mEq by mouth 2 (two) times daily.   6  . pravastatin (PRAVACHOL) 20 MG tablet Take 20 mg by mouth every evening.   2    REVIEW OF SYSTEMS:  A 15 point review of systems is documented in the electronic medical record. This was obtained by the nursing staff. However, I   reviewed this with the patient to discuss relevant findings and make appropriate changes.  Patient has had problems with diarrhea with her external beam treatments. This is managed well with Imodium she denies any nausea. Has some vaginal discharge but no active bleeding at this time.   PHYSICAL EXAM:  height is 5' 8" (1.727 m) and weight is 197 lb (89.359 kg).  this is a very pleasant 63-year-old female in no acute distress. She has left-sided weakness from prior  complications related to cerebral aneurysm vital signs at the [facility] on August 22 showed a temperature of 98.1 pulse of 76 respirations 18 blood pressure 141/68 oxygen saturation 98% on room air. Examination of the oral cavity reveals no secondary infection. No palpable adenopathy in the neck or supraclavicular region. The lungs are clear to auscultation. The heart has a regular rhythm and rate. The abdomen is soft and nontender with normal bowel sounds. Pelvic exam is deferred until intraoperative procedure. Extremities show good peripheral pulses. Mild edema and ankle and foot areas. The left-sided weakness. The patient ambulates with the assistance of a cane.    LABORATORY DATA:  To be scanned in from [facility],  August 16 white count 6.4 hemoglobin 10.0 hematocrit 29.7 platelet count 148,000 and absolute neutrophil count 4.9, glucose 120 BUN 13 creatinine 0.77 calcium 9.0 magnesium 1.5      IMPRESSION: Stage II-B endocervical adenocarcinoma. Patient will proceed with brachytherapy treatments using iridium 192 as the high-dose-rate source. Operative procedure scheduled for August 24 for placement of tandem ring in preparation for high-dose rate radiation therapy.    ------------------------------------------------  Alanson Hausmann D. Besnik Febus, PhD, MD         

## 2015-08-26 NOTE — Progress Notes (Signed)
IMMEDIATELY FOLLOWING SURGERY: Do not drive or operate machinery for the first twenty four hours after surgery. Do not make any important decisions for twenty four hours after surgery or while taking narcotic pain medications or sedatives. If you develop intractable nausea and vomiting or a severe headache please notify your doctor immediately.   FOLLOW-UP: You do not need to follow up with anesthesia unless specifically instructed to do so.   WOUND CARE INSTRUCTIONS (if applicable): Expect some mild vaginal bleeding, but if large amount of bleeding occurs please contact Dr. Kinard at [phone] or the Radiation On-Call physician. Call for any fever greater than 101.0 degrees or increasing vaginal//abdominal pain or trouble urinating.   QUESTIONS?: Please feel free to call your physician or the hospital operator if you have any questions, and they will be happy to assist you. Resume all medications: as listed on your after visit summary. Your next appointment is:  No future appointments.   

## 2015-08-26 NOTE — Progress Notes (Signed)
Editor: Gery Pray, MD (Physician)     Expand All Collapse All    Radiation Oncology [phone] ________________________________  Name: Panda Crossin: [id number] Date: 9/22/2016DOB: 1951/01/25   HDR BRACHYTHERAPY  DIAGNOSIS: Clinical stage IIB endocervical adenocarcinoma.  NARRATIVE: After planning was complete the patient was transferred to the high-dose-rate suite. She was placed in the treatment position. A fiducial marker was placed within the tandem ring system for imaging purposes.  Verification simulation note:  An AP and lateral film was obtained in the treatment position. This verified good position of the tandem/ring for treatment.  HDR brachytherapy treatment:  The remote afterloader was attached to the tandem/ring system by catheter system. The patient then proceeded to undergo her fifth high-dose-rate treatment directed at the cervical area. The patient was treated with iridium 192 as the high-dose-rate source. Patient was prescribed a dose of 5.5 gray to be delivered to the high risk CTV. This was achieved using 2 channels, that being the ring channel and the tandem channel. Total treatment time was 634.2 seconds. Patient tolerated her treatment well. After completion of her radiation therapy a survey was performed documenting return of the iridium source into the gamma med safe.  PLAN: Patient will return for follow-up in approximate a month at the [facility] in [place]   This document serves as a record of services personally performed by Gery Pray, MD. It was created on his behalf by Lenn Cal, a trained medical scribe. The creation of this record is based on the scribe's personal observations and the Azyria Osmon's statements to them. This document has been checked and approved by the attending Laikynn Pollio.   -----------------------------------  Blair Promise, PhD, MD

## 2015-08-26 NOTE — Progress Notes (Signed)
Foley catheter removed intact.

## 2015-08-26 NOTE — Transfer of Care (Signed)
Immediate Anesthesia Transfer of Care Note  Patient: [name]  Procedure(s) Performed: Procedure(s): TANDEM RING PLACEMENT  (N/A)  Patient Location: PACU  Anesthesia Type:General  Level of Consciousness: awake, alert , oriented and patient cooperative  Airway & Oxygen Therapy: Patient Spontanous Breathing and Patient connected to nasal cannula oxygen  Post-op Assessment: Report given to RN and Post -op Vital signs reviewed and stable  Post vital signs: Reviewed and stable  Last Vitals:  Filed Vitals:   08/26/15 0619  BP: 109/67  Pulse: 66  Temp: 36.8 C  Resp: 16    Complications: No apparent anesthesia complications

## 2015-08-26 NOTE — Addendum Note (Signed)
Encounter addended by: Jacqulyn Liner, RN on: 08/26/2015  1:02 PM<BR>     Documentation filed: Notes Section

## 2015-08-26 NOTE — Anesthesia Preprocedure Evaluation (Addendum)
Anesthesia Evaluation  Patient identified by MRN, date of birth, ID band Patient awake    Reviewed: Allergy & Precautions, NPO status , Patient's Chart, lab work & pertinent test results  Airway Mallampati: II   Neck ROM: full    Dental  (+) Dental Advisory Given, Poor Dentition, Missing   Pulmonary former smoker,    breath sounds clear to auscultation       Cardiovascular hypertension, Pt. on medications and Pt. on home beta blockers  Rhythm:regular Rate:Normal     Neuro/Psych Depression    GI/Hepatic GERD  Medicated and Controlled,  Endo/Other  diabetes, Type 2  Renal/GU    Pt c/o burning with urination and frequency.    Musculoskeletal  (+) Arthritis , Osteoarthritis,    Abdominal   Peds  Hematology   Anesthesia Other Findings Multiple teeth missing, "none loose".  Reproductive/Obstetrics Endometrial CA.                          Anesthesia Physical Anesthesia Plan  ASA: III  Anesthesia Plan: General   Post-op Pain Management:    Induction: Intravenous  Airway Management Planned: LMA  Additional Equipment:   Intra-op Plan:   Post-operative Plan:   Informed Consent: I have reviewed the patients History and Physical, chart, labs and discussed the procedure including the risks, benefits and alternatives for the proposed anesthesia with the patient or authorized representative who has indicated his/her understanding and acceptance.     Plan Discussed with: CRNA, Anesthesiologist and Surgeon  Anesthesia Plan Comments:         Anesthesia Quick Evaluation

## 2015-08-26 NOTE — Addendum Note (Signed)
Encounter addended by: Jacqulyn Liner, RN on: 08/26/2015 12:55 PM<BR>     Documentation filed: Inpatient MAR

## 2015-08-26 NOTE — Addendum Note (Signed)
Encounter addended by: Jacqulyn Liner, RN on: 08/26/2015 12:54 PM<BR>     Documentation filed: Dx Association, Orders

## 2015-08-27 ENCOUNTER — Telehealth: Payer: Self-pay | Admitting: Oncology

## 2015-08-27 ENCOUNTER — Encounter (HOSPITAL_BASED_OUTPATIENT_CLINIC_OR_DEPARTMENT_OTHER): Payer: Self-pay | Admitting: Radiation Oncology

## 2015-08-27 LAB — URINE CULTURE: Culture: NO GROWTH

## 2015-08-27 NOTE — Telephone Encounter (Signed)
[name] and let her know the clear results of her urine culture.  Lori [name] verbalized agreement and understanding.

## 2015-09-04 NOTE — Addendum Note (Signed)
Encounter addended by: Gery Pray, MD on: 09/04/2015  5:26 PM<BR>     Documentation filed: Notes Section

## 2015-09-06 NOTE — Progress Notes (Signed)
  Radiation Oncology         [phone] ________________________________  Name: Lori [name] MRN: [id number]  Date: 08/26/2015  DOB: 1950-12-05  End of Treatment Note  DIAGNOSIS: clinical stage IIB endocervical adenocarcinoma.  Indication for treatment:  Definitive treatment along with external beam radiation therapy       Radiation treatment dates:   07/28/2015 9/1/20116, 08/11/2015, 08/17/2015, 08/26/2015  Site/dose:   Cervical region, 27.5 gray in 5 fractions (5.5 Gy to the high risk CTV)  Beams/energy:   Patient was treated with intracavitary high-dose rate treatments using iridium 192 as the high-dose-rate source. The patient was treated with a tandem and ring apparatus  Narrative: The patient tolerated radiation treatment relatively well.   Her vaginal drainage and bleeding improved during the course of treatment. She had some mild/moderate discomfort with the brachytherapy procedures.  Plan: The patient has completed radiation treatment. The patient will return to radiation oncology clinic for routine followup in one month. I advised them to call or return sooner if they have any questions or concerns related to their recovery or treatment.  The patient completed 45 gray to the pelvis and a 9 gray sidewall boost at the [facility] in [place] prior to brachytherapy treatments. She also receive radiosensitizing chemotherapy at that facility. She will follow-up in radiation oncology at the [facility]  -----------------------------------  Blair Promise, PhD, MD

## 2015-12-05 DIAGNOSIS — D649 Anemia, unspecified: Secondary | ICD-10-CM

## 2015-12-05 HISTORY — DX: Anemia, unspecified: D64.9

## 2015-12-20 ENCOUNTER — Ambulatory Visit: Payer: Medicare Other | Admitting: Gynecologic Oncology

## 2015-12-30 ENCOUNTER — Encounter: Payer: Self-pay | Admitting: Gynecologic Oncology

## 2015-12-30 ENCOUNTER — Ambulatory Visit: Payer: Medicare Other | Attending: Gynecologic Oncology | Admitting: Gynecologic Oncology

## 2015-12-30 VITALS — BP 134/74 | HR 59 | Temp 98.0°F | Resp 18 | Ht 68.0 in | Wt 187.0 lb

## 2015-12-30 DIAGNOSIS — C539 Malignant neoplasm of cervix uteri, unspecified: Secondary | ICD-10-CM | POA: Insufficient documentation

## 2015-12-30 NOTE — Patient Instructions (Addendum)
Plan to have a PET scan on February 8 at [place] at 12:30pm at [facility] and nothing to eat or drink six hours before.  Plan to follow up with Dr. Denman George in July 2017 or sooner if needed.  Call [phone] after you see Dr. Sondra Come to schedule your appointment.

## 2015-12-30 NOTE — Progress Notes (Signed)
Follow-up Note: Gyn-Onc  Consult was requested by Dr. Adah Perl for the evaluation of Lori [name] 65 y.o. female  CC:  Chief Complaint  Patient presents with  . endometrial cancer    MD follow up visit    Assessment/Plan:  [name]  is a 65 y.o.  year old with a history of clinical stage IIB endocervical adenocarcinoma. She is s/p primary chemoradiation with Dr Sondra Come and Dr Melchor Amour. She has had a complete clinical response completed in September 2016.  I am recommending primary chemoradiation with external beam radiation, brachytherapy and radiosensitizing cisplatin chemotherapy. The patient is interested in being treated in [place], and we will facilitate these appointments.  We will schedule her for a restaging PET/CT imaging to evaluate for radiographic response.   HPI: Lori [name] is a 65 year old woman is seen in consultation at the request of Dr. Adah Perl for postmenopausal bleeding and adenocarcinoma.   The patient reports having postmenopausal bleeding sent March 2016. She was evaluated with a Pap smear in April 2016 which revealed ASC-H. she then presented for colposcopic evaluation at which time an abnormal cervix was visualized. An endometrial biopsy was taken and this revealed adenocarcinoma with special stains consistent with an endocervical primary (CEA and P 16 positivity). She underwent a CT scan of the abdomen and pelvis on 04/09/2015 and this revealed no lymphadenopathy, mild low attenuation thickening of the endometrium, no gross metastatic disease.  She underwent PET/CT on 05/31/16 which showed intense cervical activity, a clinically positive left external iliac node and intense activity at the distal appendix with a thickened appendiceal wall (16mm) in this region.  Interval Hx: She went on to receive definitive chemoradiation with external beam and brachytherapy and weekly cddp at [facility] with Dr's Kinard and Neijstrom. Treatment  was completed on 08/26/15 without treatment delays.  She states that she tolerated treatment well with no major toxicities. She denies bleeding (other than occasional pink discharge). She has normal bowel function.  She was admitted to hospital for anemia and a blood transfusion in early January 2017 but is now doing well.  Current Meds:  Outpatient Encounter Prescriptions as of 12/30/2015  Medication Sig  . amLODipine (NORVASC) 10 MG tablet Take 10 mg by mouth every morning.   Marland Kitchen amoxicillin-clavulanate (AUGMENTIN) 875-125 MG tablet   . aspirin 81 MG tablet Take 81 mg by mouth daily.  . benazepril (LOTENSIN) 40 MG tablet Take 40 mg by mouth every morning.   . carbamazepine (TEGRETOL) 100 MG chewable tablet Chew 100 mg by mouth 3 (three) times daily.   . cloNIDine (CATAPRES) 0.2 MG tablet Take 0.2 mg by mouth 3 (three) times daily.   Marland Kitchen gabapentin (NEURONTIN) 300 MG capsule Take 300 mg by mouth 2 (two) times daily.   . hydrochlorothiazide (HYDRODIURIL) 25 MG tablet Take 25 mg by mouth every morning.   . metoprolol (LOPRESSOR) 50 MG tablet Take by mouth 2 (two) times daily. 1.5 tablets by mouth in the morning, 1 tablet in the evening  . Multiple Vitamins-Minerals (MULTIVITAMIN WITH MINERALS) tablet Take 1 tablet by mouth daily.  . potassium chloride SA (K-DUR,KLOR-CON) 20 MEQ tablet Take 20 mEq by mouth 2 (two) times daily.   . pravastatin (PRAVACHOL) 20 MG tablet Take 20 mg by mouth every evening.   Marland Kitchen ibuprofen (ADVIL,MOTRIN) 400 MG tablet Take 400 mg by mouth every 6 (six) hours as needed. Reported on 12/30/2015  . loperamide (IMODIUM A-D) 2 MG tablet Take 2 mg by mouth 4 (four) times  daily as needed for diarrhea or loose stools. Reported on 12/30/2015  . [DISCONTINUED] ciprofloxacin (CIPRO) 500 MG tablet    No facility-administered encounter medications on file as of 12/30/2015.    Allergy:  Allergies  Allergen Reactions  . Contrast Media [Iodinated Diagnostic Agents] Itching and Swelling   . Dilantin [Phenytoin Sodium Extended] Itching and Swelling    Whole body  . Latex Hives and Itching  . Adhesive [Tape] Rash    Social Hx:   Social History   Social History  . Marital Status: Single    Spouse Name: N/A  . Number of Children: N/A  . Years of Education: N/A   Occupational History  . Not on file.   Social History Main Topics  . Smoking status: Former Smoker -- 5 years    Types: Cigarettes    Quit date: 12/05/1995  . Smokeless tobacco: Never Used  . Alcohol Use: No  . Drug Use: No  . Sexual Activity: Not on file   Other Topics Concern  . Not on file   Social History Narrative    Past Surgical Hx:  Past Surgical History  Procedure Laterality Date  . Craniotomy posterior fossa  1997    Repair aneurysm  x2  left side  . Port-a-cath placement  06/  2016  . Tandem ring insertion N/A 07/28/2015    Procedure: TANDEM RING PLACEMENT;  Surgeon: Gery Pray, MD;  Location: [facility];  Service: Urology;  Laterality: N/A;  . Tandem ring insertion N/A 08/05/2015    Procedure: TANDEM RING PLACEMENT ;  Surgeon: Gery Pray, MD;  Location: [facility];  Service: Urology;  Laterality: N/A;  . Tandem ring insertion N/A 08/11/2015    Procedure: TANDEM RING PLACEMENT ;  Surgeon: Gery Pray, MD;  Location: [facility];  Service: Urology;  Laterality: N/A;  . Tandem ring insertion N/A 08/17/2015    Procedure: TANDEM RING PLACEMENT ;  Surgeon: Gery Pray, MD;  Location: [facility];  Service: Urology;  Laterality: N/A;  . Tandem ring insertion N/A 08/26/2015    Procedure: TANDEM RING PLACEMENT ;  Surgeon: Gery Pray, MD;  Location: [facility];  Service: Urology;  Laterality: N/A;    Past Medical Hx:  Past Medical History  Diagnosis Date  . Depression   . Hypertension   . Hyperlipidemia   . History of cerebral aneurysm repair     1997--  hemarrhagic aneurysm x2 --- s/p  left posterior fossa  craniectomy  . PMB (postmenopausal bleeding)   . History of seizures     post-op craniotomy for aneurysm 1997-- controlled w/ medication since then no seizures  . Type 2 diabetes, diet controlled ([place])   . GERD (gastroesophageal reflux disease)   . Arthritis     KNEES  . Endometrial carcinoma [facility])     goes to [facility] in [place] for chemotherapy  . Cervical carcinoma [facility]) oncologist--  dr Sondra Come for radiation/   [facility] for chemotherapy    endocervical adenocarcinoma w/  Nodal METS--  FIGO Stage IIB    Past Gynecological History:  SVD x 3  No LMP recorded. Patient is postmenopausal.  Family Hx:  Family History  Problem Relation Age of Onset  . Throat cancer Father   . Cervical cancer Sister     Review of Systems:  Constitutional  Feels well,    ENT Normal appearing ears and nares bilaterally Skin/Breast  No rash, sores,  jaundice, itching, dryness Cardiovascular  No chest pain, shortness of breath, or edema  Pulmonary  No cough or wheeze.  Gastro Intestinal  No nausea, vomitting, or diarrhoea. No bright red blood per rectum, no abdominal pain, change in bowel movement, or constipation.  Genito Urinary  No frequency, urgency, dysuria, see HPI Musculo Skeletal  No myalgia, arthralgia, joint swelling or pain  Neurologic  No weakness, numbness, change in gait,  Psychology  No depression, anxiety, insomnia.   Vitals:  Blood pressure 134/74, pulse 59, temperature 98 F (36.7 C), temperature source Oral, resp. rate 18, height 5\' 8"  (1.727 m), weight 187 lb (84.823 kg), SpO2 100 %.  Physical Exam: WD in NAD Neck  Supple NROM, without any enlargements.  Lymph Node Survey No cervical supraclavicular or inguinal adenopathy Cardiovascular  Pulse normal rate, regularity and rhythm. S1 and S2 normal.  Lungs  Clear to auscultation bilateraly, without wheezes/crackles/rhonchi. Good air movement.  Skin  No rash/lesions/breakdown   Psychiatry  Alert and oriented to person, place, and time  Abdomen  Normoactive bowel sounds, abdomen soft, non-tender and overweight without evidence of hernia.  Back No CVA tenderness Genito Urinary  Vulva/vagina: Normal external female genitalia.  No lesions. No discharge or bleeding.  Bladder/urethra:  No lesions or masses, well supported bladder  Vagina: Grossly normal with radiation changes and loss of rugation.  Cervix: Cervix flush with upper vagina. Some agglutination and narrowing of upper vagina.  Uterus: Small, mobile, no parametrial involvement or nodularity.  Adnexa: no discrete masses. Rectal  Good tone, no masses no cul de sac nodularity.  Extremities  No bilateral cyanosis, clubbing or edema.   Donaciano Eva, MD   12/30/2015, 4:37 PM

## 2016-01-12 ENCOUNTER — Encounter (HOSPITAL_COMMUNITY)
Admission: RE | Admit: 2016-01-12 | Discharge: 2016-01-12 | Disposition: A | Discharge status: Home / Self Care | Payer: Medicare Other | Source: Ambulatory Visit | Attending: Gynecologic Oncology | Admitting: Gynecologic Oncology

## 2016-01-12 DIAGNOSIS — C539 Malignant neoplasm of cervix uteri, unspecified: Secondary | ICD-10-CM | POA: Diagnosis not present

## 2016-01-12 LAB — GLUCOSE, CAPILLARY: Glucose-Capillary: 95 mg/dL (ref 65–99)

## 2016-01-12 MED ORDER — FLUDEOXYGLUCOSE F - 18 (FDG) INJECTION
9.6000 | Freq: Once | INTRAVENOUS | Status: AC | PRN
  Administered 2016-01-12: 9.6 via INTRAVENOUS

## 2016-01-31 ENCOUNTER — Encounter: Payer: Self-pay | Admitting: Gynecologic Oncology

## 2016-01-31 ENCOUNTER — Encounter: Payer: Self-pay | Admitting: Radiation Oncology

## 2016-01-31 DIAGNOSIS — C772 Secondary and unspecified malignant neoplasm of intra-abdominal lymph nodes: Secondary | ICD-10-CM | POA: Insufficient documentation

## 2016-01-31 NOTE — Progress Notes (Signed)
Gynecologic Oncology Multi-Disciplinary Disposition Conference Note  Date of the Conference: January 31, 2016  Patient Name: Lori [name]  Referring Provider: Dr. Nat Math Primary GYN Oncologist: [name]  Stage/Disposition:  Stage IIB endocervical adenocarcinoma.  Disposition is to radiation to the PET positive para-aortic lymph node followed by chemotherapy with carboplatin and taxol.  Referral will also be made to Surgical Oncology for assessment and surgical evaluation of appendix.   This Multidisciplinary conference took place involving physicians from [facility], [facility], Radiation Oncology, Pathology, Radiology along with the Gynecologic Oncology Nurse Practitioner and RN.  Comprehensive assessment of the patient's malignancy, staging, need for surgery, chemotherapy, radiation therapy, and need for further testing were reviewed. Supportive measures, both inpatient and following discharge were also discussed. The recommended plan of care is documented. Greater than 35 minutes were spent correlating and coordinating this patient's care.

## 2016-02-11 ENCOUNTER — Telehealth: Payer: Self-pay | Admitting: Gynecologic Oncology

## 2016-02-11 NOTE — Telephone Encounter (Signed)
Left message for patient asking her to please call the office to discuss Dr. Serita Grit recommendations.  We are going to have the patient meet with Dr. Rolanda Jay due to the findings on her PET surrounding her appendix.  She is currently getting ready to start radiation in [place].

## 2016-02-14 ENCOUNTER — Telehealth: Payer: Self-pay | Admitting: Gynecologic Oncology

## 2016-02-14 NOTE — Telephone Encounter (Signed)
Patient returned call to the office.  Informed patient of recommendation for her to meet with Dr. Rolanda Jay for evaluation of appendiceal findings on recent PET.  Patient is to call the office once her radiation schedule with Dr. Sondra Come has been determined to schedule an appt.  Advised to call for any needs or concerns.

## 2016-02-15 ENCOUNTER — Telehealth: Payer: Self-pay | Admitting: *Deleted

## 2016-02-15 NOTE — Telephone Encounter (Signed)
Left on voicemail with future appointment details. Pt has a appointment with Dr. Rolanda Jay on 03/28/16 @ 1:00pm

## 2016-03-28 ENCOUNTER — Encounter: Payer: Self-pay | Admitting: General Surgery

## 2016-03-28 ENCOUNTER — Ambulatory Visit (HOSPITAL_BASED_OUTPATIENT_CLINIC_OR_DEPARTMENT_OTHER): Payer: Medicare Other | Admitting: General Surgery

## 2016-03-28 VITALS — BP 124/66 | HR 64 | Temp 97.6°F | Resp 17 | Wt 199.7 lb

## 2016-03-28 DIAGNOSIS — Z8541 Personal history of malignant neoplasm of cervix uteri: Secondary | ICD-10-CM | POA: Diagnosis not present

## 2016-03-28 DIAGNOSIS — Z9221 Personal history of antineoplastic chemotherapy: Secondary | ICD-10-CM

## 2016-03-28 DIAGNOSIS — Z923 Personal history of irradiation: Secondary | ICD-10-CM | POA: Diagnosis not present

## 2016-03-28 DIAGNOSIS — R948 Abnormal results of function studies of other organs and systems: Secondary | ICD-10-CM

## 2016-03-28 NOTE — Patient Instructions (Signed)
Plan to have a PET scan on Tuesday, May 2 with arrival at 11:30pm at [facility] to re-evaluate to appendix.  Nothing to eat or drink 6 hours before your PET scan.  We will call you with the results and come up with a plan with Dr. Rolanda Jay.  Please call for any questions or concerns.

## 2016-03-28 NOTE — Progress Notes (Signed)
[facility] CONSULT NOTE  Patient Care Team: [name], MD as PCP - General (Internal Medicine)  CHIEF COMPLAINTS/PURPOSE OF CONSULTATION: Possible appendiceal lesion   HISTORY OF PRESENTING ILLNESS:  Lori [name] 65 y.o. female with a history of stage IIB cervical cancer status post definitive chemoradiation therapy. On subsequent scanning she was noted to have a periaortic hypermetabolic lymph node as well as a question of uptake that appeared to be increasing involving the appendix. The decision was made to address the hypermetabolic lymph node with radiation therapy and then have her evaluated by me to discuss her appendix. On specific questioning she states that she feels well. She denies any constitutional symptoms. She denies any nausea vomiting, abdominal pain and on specific questioning denies any mid or right lower quadrant abdominal pain. She reports her bowel movements a been normal and has no specific complaints.  I reviewed her records extensively and collaborated the history with the patient.  SUMMARY OF ONCOLOGIC HISTORY:  Stage IIB Cervical Cancer 2016    MEDICAL HISTORY:  Past Medical History  Diagnosis Date  . Depression   . Hypertension   . Hyperlipidemia   . 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 PMB (postmenopausal bleeding)   . History of seizures     post-op craniotomy for aneurysm 1997-- controlled w/ medication since then no seizures  . Type 2 diabetes, diet controlled ([place])   . GERD (gastroesophageal reflux disease)   . Arthritis     KNEES  . Endometrial carcinoma [facility])     goes to [facility] in [place] for chemotherapy  . 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 Craniotomy posterior fossa  1997    Repair aneurysm  x2  left side  . Port-a-cath placement  06/  2016  . Tandem ring insertion N/A 07/28/2015    Procedure: TANDEM RING PLACEMENT;  Surgeon: Gery Pray, MD;  Location: [facility];  Service: Urology;  Laterality: N/A;  . Tandem ring insertion N/A 08/05/2015    Procedure: TANDEM RING PLACEMENT ;  Surgeon: Gery Pray, MD;  Location: [facility];  Service: Urology;  Laterality: N/A;  . Tandem ring insertion N/A 08/11/2015    Procedure: TANDEM RING PLACEMENT ;  Surgeon: Gery Pray, MD;  Location: [facility];  Service: Urology;  Laterality: N/A;  . Tandem ring insertion N/A 08/17/2015    Procedure: TANDEM RING PLACEMENT ;  Surgeon: Gery Pray, MD;  Location: [facility];  Service: Urology;  Laterality: N/A;  . Tandem ring insertion N/A 08/26/2015    Procedure: TANDEM RING PLACEMENT ;  Surgeon: Gery Pray, MD;  Location: [facility];  Service: Urology;  Laterality: N/A;    SOCIAL HISTORY: Social History   Social History  . Marital Status: Single    Spouse Name: N/A  . Number of Children: N/A  . Years of Education: N/A   Occupational History  . Not on file.   Social History Main Topics  . Smoking status: Former Smoker -- 5 years    Types: Cigarettes    Quit date: 12/05/1995  . Smokeless tobacco: Never Used  . Alcohol Use: No  .  Drug Use: No  . Sexual Activity: Not on file   Other Topics Concern  . Not on file   Social History Narrative    FAMILY HISTORY: Family History  Problem Relation Age of Onset  . Throat cancer Father   . Cervical cancer Sister     ALLERGIES:  is allergic to contrast media; dilantin; latex; and adhesive.  MEDICATIONS:  Current Outpatient Prescriptions  Medication Sig Dispense Refill  . amLODipine (NORVASC) 10 MG tablet Take 10 mg by mouth every morning.   5  . aspirin 81 MG tablet Take 81  mg by mouth daily.    . benazepril (LOTENSIN) 40 MG tablet Take 40 mg by mouth every morning.   5  . carbamazepine (TEGRETOL) 100 MG chewable tablet Chew 100 mg by mouth 3 (three) times daily.   5  . cloNIDine (CATAPRES) 0.2 MG tablet Take 0.2 mg by mouth 3 (three) times daily.   6  . gabapentin (NEURONTIN) 300 MG capsule Take 300 mg by mouth 2 (two) times daily.   5  . hydrochlorothiazide (HYDRODIURIL) 25 MG tablet Take 25 mg by mouth every morning.   11  . metoprolol (LOPRESSOR) 50 MG tablet Take by mouth 2 (two) times daily. Marland Kitchen5 tablets by mouth in the morning, 1 tablet in the evening  5  . Multiple Vitamins-Minerals (MULTIVITAMIN WITH MINERALS) tablet Take 1 tablet by mouth daily.    . potassium chloride SA (K-DUR,KLOR-CON) 20 MEQ tablet Take 20 mEq by mouth 2 (two) times daily.   6  . pravastatin (PRAVACHOL) 20 MG tablet Take 20 mg by mouth every evening.   2  . ibuprofen (ADVIL,MOTRIN) 400 MG tablet Take 400 mg by mouth every 6 (six) hours as needed. Reported on 03/28/2016  0  . loperamide (IMODIUM A-D) 2 MG tablet Take 2 mg by mouth 4 (four) times daily as needed for diarrhea or loose stools. Reported on 03/28/2016         REVIEW OF SYSTEMS:   Constitutional: Denies fevers, chills or abnormal night sweats Eyes: Denies blurriness of vision, double vision or watery eyes Ears, nose, mouth, throat, and face: Denies mucositis or sore throat Respiratory: Denies cough, dyspnea or wheezes Cardiovascular: Denies palpitation, chest discomfort or lower extremity swelling Gastrointestinal:  Denies nausea, heartburn or change in bowel habits Skin: Denies abnormal skin rashes Lymphatics: Denies new lymphadenopathy or easy bruising Neurological:Denies numbness, tingling or new weaknesses Behavioral/Psych: Mood is stable, no new changes  Breast:  Denies any palpable lumps or discharge All other systems were reviewed with the patient and are negative.  PHYSICAL EXAMINATION: ECOG PERFORMANCE STATUS:  0 - Asymptomatic  Filed Vitals:   03/28/16 1221  BP: 124/66  Pulse: 64  Temp: 97.6 F (36.4 C)  Resp: 17   Filed Weights   03/28/16 1221  Weight: 199 lb 11.2 oz (90.583 kg)    GENERAL:alert, no distress and comfortable SKIN: skin color, texture, turgor are normal, no rashes or significant lesions EYES: normal, conjunctiva are pink and non-injected, sclera clear OROPHARYNX:no exudate, no erythema and lips, buccal mucosa, and tongue normal  NECK: supple, thyroid normal size, non-tender, without nodularity LYMPH:  no palpable lymphadenopathy in the cervical, axillary or inguinal LUNGS: clear to auscultation and percussion with normal breathing effort HEART: regular rate & rhythm and no murmurs and no lower extremity edema ABDOMEN:abdomen soft, non-tender and normal bowel sounds Musculoskeletal:no cyanosis of digits and no clubbing  PSYCH: alert & oriented x 3 with fluent speech NEURO: no  focal motor/sensory deficits (exam performed in the presence of a chaperone)   LABORATORY DATA:  I have reviewed the data as listed Lab Results  Component Value Date   HGB 12.6 08/17/2015   HCT 37.0 08/17/2015   Lab Results  Component Value Date   NA 136 08/17/2015   K 3.8 08/17/2015    RADIOGRAPHIC STUDIES: I have personally reviewed the radiological reports and agreed with the findings in the report. I reviewed the PET/CT scan dated 06/01/2015 and compared it with the PET/CT scan dated 01/12/2016. There does appear to be an area of increased metabolic uptake involving the appendix. This appears slightly increased between the 2 studies. There is also a periaortic lymph node that was hypermetabolic and represents the area that was treated. Of note there is fluid involving the region of the uterus consistent with cervical cancer diagnosis.  ASSESSMENT AND PLAN:  Patient status post definitive chemotherapy radiation for cervical cancer with subsequent hypermetabolic periaortic lymph node  treated with local radiation therapy. Now there is a question regarding hypermetabolism in the region of the appendix. Most recent imaging was approximately 2-1/2 months prior.  We will plan to get an updated PET/CT scan to evaluate the area of the appendix. If the hypermetabolism has resolved then she can be followed expectantly, however if there is still hypermetabolism we discussed moving forward with laparoscopic or possible open appendectomy to rule out disease. She understands and agrees with the plan as outlined above.   All questions were answered. The patient knows to call the clinic with any problems, questions or concerns.    Frederich Cha, MD 1:13 PM

## 2016-04-04 ENCOUNTER — Ambulatory Visit (HOSPITAL_COMMUNITY)
Admission: RE | Admit: 2016-04-04 | Discharge: 2016-04-04 | Disposition: A | Discharge status: Home / Self Care | Payer: Medicare Other | Source: Ambulatory Visit | Attending: Gynecologic Oncology | Admitting: Gynecologic Oncology

## 2016-04-04 DIAGNOSIS — R948 Abnormal results of function studies of other organs and systems: Secondary | ICD-10-CM | POA: Diagnosis not present

## 2016-04-04 LAB — GLUCOSE, CAPILLARY: Glucose-Capillary: 102 mg/dL — ABNORMAL HIGH (ref 65–99)

## 2016-04-04 MED ORDER — FLUDEOXYGLUCOSE F - 18 (FDG) INJECTION
9.6700 | Freq: Once | INTRAVENOUS | Status: AC | PRN
  Administered 2016-04-04: 9.67 via INTRAVENOUS

## 2016-04-05 ENCOUNTER — Telehealth: Payer: Self-pay | Admitting: Gynecologic Oncology

## 2016-04-05 NOTE — Telephone Encounter (Signed)
Left message for patient about recent PET scan and the need to schedule an appt with Dr. Rolanda Jay on Tues to discuss scheduling removal of her appendix.

## 2016-04-11 ENCOUNTER — Encounter: Payer: Self-pay | Admitting: General Surgery

## 2016-04-11 ENCOUNTER — Ambulatory Visit (HOSPITAL_BASED_OUTPATIENT_CLINIC_OR_DEPARTMENT_OTHER): Payer: Medicare Other | Admitting: General Surgery

## 2016-04-11 VITALS — BP 125/75 | HR 59 | Temp 98.1°F | Resp 18 | Wt 203.4 lb

## 2016-04-11 DIAGNOSIS — C181 Malignant neoplasm of appendix: Secondary | ICD-10-CM

## 2016-04-11 DIAGNOSIS — K389 Disease of appendix, unspecified: Secondary | ICD-10-CM | POA: Diagnosis not present

## 2016-04-11 DIAGNOSIS — R948 Abnormal results of function studies of other organs and systems: Secondary | ICD-10-CM

## 2016-04-11 HISTORY — DX: Malignant neoplasm of appendix: C18.1

## 2016-04-11 NOTE — Progress Notes (Signed)
I had the pleasure of meeting with Lori [name] today Lori [name] regarding evaluation of a slightly dilated and hypermetabolic appendix that was seen on her imaging. As it had been some time since her prior imaging I have requested that she get a repeat PET/CT scan.  IMPRESSION: 1. Interval decrease in FDG uptake associated with retroperitoneal hypermetabolic lymph node reported on previous exam. Findings compatible with response to therapy. 2. There is a focal area of increased uptake involving the skin overlying the lateral aspect of the left hip. This is favored to represent an inflammatory or infectious process. Cutaneous metastasis is considered less favored. 3. Mild increased uptake within the left angle region, likely reactive. 4. Decrease in degree of distention associated with the endometrial cavity 5. Unchanged appearance of the distal appendix which appears thickened and exhibits mild increased uptake.   Electronically Signed  By: Kerby Moors M.D.  On: 04/04/2016 15:23  I reviewed these findings with the patient and indicated that the appendix is still dilated with abnormal uptake. She understands that we cannot be sure if this is representative of malignancy. I reviewed the anatomic location of the appendix, how a laparoscopic versus open appendectomy would be performed. After reviewing the risk and benefits of the procedure she is comfortable moving forward with laparoscopic possible open appendectomy. She fully understands the possibility of a normal appendix following resection. We will be scheduling her for surgery in the near future.

## 2016-04-11 NOTE — Patient Instructions (Signed)
Preparing for your Surgery  Plan for surgery on May 17 with Dr. Rolanda Jay.  You will be scheduled for a laparoscopic appendectomy, possible open appendectomy if needed.  Pre-operative Testing -You will receive a phone call from presurgical RN at the [facility] your insurance card, copy of an advanced directive if applicable, medication list  -At that visit, you will be asked to sign a consent for a possible blood transfusion in case a transfusion becomes necessary during surgery.  The need for a blood transfusion is rare but having consent is a necessary part of your care.     -You should not be taking blood thinners or aspirin at least ten days prior to surgery unless instructed by your surgeon.  Day Before Surgery at Home -Avoid carbonated beverages.  You will be advised to have nothing to eat or drink after midnight the evening before.    Your role in recovery Your role is to become active as soon as directed by your doctor, while still giving yourself time to heal.  Rest when you feel tired. You will be asked to do the following in order to speed your recovery:  - Cough and breathe deeply. This helps toclear and expand your lungs and can prevent pneumonia. You may be given a spirometer to practice deep breathing. A staff member will show you how to use the spirometer. - Do mild physical activity. Walking or moving your legs help your circulation and body functions return to normal. A staff member will help you when you try to walk and will provide you with simple exercises. Do not try to get up or walk alone the first time. - Actively manage your pain. Managing your pain lets you move in comfort. We will ask you to rate your pain on a scale of zero to 10. It is your responsibility to tell your doctor or nurse where and how much you hurt so your pain can be treated.  Special Considerations -If you are diabetic, you may be placed on insulin after surgery to have  closer control over your blood sugars to promote healing and recovery.  This does not mean that you will be discharged on insulin.  If applicable, your oral antidiabetics will be resumed when you are tolerating a solid diet.  -Your final pathology results from surgery should be available by the Friday after surgery and the results will be relayed to you when available.  Blood Transfusion Information WHAT IS A BLOOD TRANSFUSION? A transfusion is the replacement of blood or some of its parts. Blood is made up of multiple cells which provide different functions.  Red blood cells carry oxygen and are used for blood loss replacement.  White blood cells fight against infection.  Platelets control bleeding.  Plasma helps clot blood.  Other blood products are available for specialized needs, such as hemophilia or other clotting disorders. BEFORE THE TRANSFUSION  Who gives blood for transfusions?   You may be able to donate blood to be used at a later date on yourself (autologous donation).  Relatives can be asked to donate blood. This is generally not any safer than if you have received blood from a stranger. The same precautions are taken to ensure safety when a relative's blood is donated.  Healthy volunteers who are fully evaluated to make sure their blood is safe. This is blood bank blood. Transfusion therapy is the safest it has ever been in the practice of medicine. Before blood is taken from  a donor, a complete history is taken to make sure that person has no history of diseases nor engages in risky social behavior (examples are intravenous drug use or sexual activity with multiple partners). The donor's travel history is screened to minimize risk of transmitting infections, such as malaria. The donated blood is tested for signs of infectious diseases, such as HIV and hepatitis. The blood is then tested to be sure it is compatible with you in order to minimize the chance of a transfusion  reaction. If you or a relative donates blood, this is often done in anticipation of surgery and is not appropriate for emergency situations. It takes many days to process the donated blood. RISKS AND COMPLICATIONS Although transfusion therapy is very safe and saves many lives, the main dangers of transfusion include:   Getting an infectious disease.  Developing a transfusion reaction. This is an allergic reaction to something in the blood you were given. Every precaution is taken to prevent this. The decision to have a blood transfusion has been considered carefully by your caregiver before blood is given. Blood is not given unless the benefits outweigh the risks.

## 2016-04-12 ENCOUNTER — Telehealth: Payer: Self-pay

## 2016-04-12 NOTE — Patient Instructions (Signed)
Lori [name]  04/12/2016   Your procedure is scheduled on:  5.17.2017    Report to [facility]  Entrance take [facility] [address]  elevators to 3rd floor to  [place] at     1115 AM.  Call this number if you have problems the morning of surgery [phone]   Remember: ONLY 1 PERSON MAY GO WITH YOU TO SHORT STAY TO GET  READY MORNING OF [place].  Do not eat food or drink liquids :After Midnight.     Take these medicines the morning of surgery with A SIP OF WATER: Amlodipine ( NOrvasc), Tegretol, Clonidine ( Catapres), Metoprolol ( Lopressor)                                You may not have any metal on your body including hair pins and              piercings  Do not wear jewelry, make-up, lotions, powders or perfumes, deodorant             Do not wear nail polish.  Do not shave  48 hours prior to surgery.                 Do not bring valuables to the hospital. [place].  Contacts, dentures or bridgework may not be worn into surgery.  Leave suitcase in the car. After surgery it may be brought to your room.       Special Instructions: coughing and deep breathing exercises, leg exercises               Please read over the following fact sheets you were given: _____________________________________________________________________             [facility] - Preparing for Surgery Before surgery, you can play an important role.  Because skin is not sterile, your skin needs to be as free of germs as possible.  You can reduce the number of germs on your skin by washing with CHG (chlorahexidine gluconate) soap before surgery.  CHG is an antiseptic cleaner which kills germs and bonds with the skin to continue killing germs even after washing. Please DO NOT use if you have an allergy to CHG or antibacterial soaps.  If your skin becomes reddened/irritated stop using the CHG and inform your nurse when you arrive at  Short Stay. Do not shave (including legs and underarms) for at least 48 hours prior to the first CHG shower.  You may shave your face/neck. Please follow these instructions carefully:  1.  Shower with CHG Soap the night before surgery and the  morning of Surgery.  2.  If you choose to wash your hair, wash your hair first as usual with your  normal  shampoo.  3.  After you shampoo, rinse your hair and body thoroughly to remove the  shampoo.                           4.  Use CHG as you would any other liquid soap.  You can apply chg directly  to the skin and wash  Gently with a scrungie or clean washcloth.  5.  Apply the CHG Soap to your body ONLY FROM THE NECK DOWN.   Do not use on face/ open                           Wound or open sores. Avoid contact with eyes, ears mouth and genitals (private parts).                       Wash face,  Genitals (private parts) with your normal soap.             6.  Wash thoroughly, paying special attention to the area where your surgery  will be performed.  7.  Thoroughly rinse your body with warm water from the neck down.  8.  DO NOT shower/wash with your normal soap after using and rinsing off  the CHG Soap.                9.  Pat yourself dry with a clean towel.            10.  Wear clean pajamas.            11.  Place clean sheets on your bed the night of your first shower and do not  sleep with pets. Day of Surgery : Do not apply any lotions/deodorants the morning of surgery.  Please wear clean clothes to the hospital/surgery center.  FAILURE TO FOLLOW THESE INSTRUCTIONS MAY RESULT IN THE CANCELLATION OF YOUR SURGERY PATIENT SIGNATURE_________________________________  NURSE SIGNATURE__________________________________  ________________________________________________________________________  WHAT IS A BLOOD TRANSFUSION? Blood Transfusion Information  A transfusion is the replacement of blood or some of its parts. Blood is made up  of multiple cells which provide different functions.  Red blood cells carry oxygen and are used for blood loss replacement.  White blood cells fight against infection.  Platelets control bleeding.  Plasma helps clot blood.  Other blood products are available for specialized needs, such as hemophilia or other clotting disorders. BEFORE THE TRANSFUSION  Who gives blood for transfusions?   Healthy volunteers who are fully evaluated to make sure their blood is safe. This is blood bank blood. Transfusion therapy is the safest it has ever been in the practice of medicine. Before blood is taken from a donor, a complete history is taken to make sure that person has no history of diseases nor engages in risky social behavior (examples are intravenous drug use or sexual activity with multiple partners). The donor's travel history is screened to minimize risk of transmitting infections, such as malaria. The donated blood is tested for signs of infectious diseases, such as HIV and hepatitis. The blood is then tested to be sure it is compatible with you in order to minimize the chance of a transfusion reaction. If you or a relative donates blood, this is often done in anticipation of surgery and is not appropriate for emergency situations. It takes many days to process the donated blood. RISKS AND COMPLICATIONS Although transfusion therapy is very safe and saves many lives, the main dangers of transfusion include:  1. Getting an infectious disease. 2. Developing a transfusion reaction. This is an allergic reaction to something in the blood you were given. Every precaution is taken to prevent this. The decision to have a blood transfusion has been considered carefully by your caregiver before blood is given. Blood is not given unless the benefits outweigh  the risks. AFTER THE TRANSFUSION  Right after receiving a blood transfusion, you will usually feel much better and more energetic. This is especially true  if your red blood cells have gotten low (anemic). The transfusion raises the level of the red blood cells which carry oxygen, and this usually causes an energy increase.  The nurse administering the transfusion will monitor you carefully for complications. HOME CARE INSTRUCTIONS  No special instructions are needed after a transfusion. You may find your energy is better. Speak with your caregiver about any limitations on activity for underlying diseases you may have. SEEK MEDICAL CARE IF:   Your condition is not improving after your transfusion.  You develop redness or irritation at the intravenous (IV) site. SEEK IMMEDIATE MEDICAL CARE IF:  Any of the following symptoms occur over the next 12 hours:  Shaking chills.  You have a temperature by mouth above 102 F (38.9 C), not controlled by medicine.  Chest, back, or muscle pain.  People around you feel you are not acting correctly or are confused.  Shortness of breath or difficulty breathing.  Dizziness and fainting.  You get a rash or develop hives.  You have a decrease in urine output.  Your urine turns a dark color or changes to pink, red, or brown. Any of the following symptoms occur over the next 10 days:  You have a temperature by mouth above 102 F (38.9 C), not controlled by medicine.  Shortness of breath.  Weakness after normal activity.  The white part of the eye turns yellow (jaundice).  You have a decrease in the amount of urine or are urinating less often.  Your urine turns a dark color or changes to pink, red, or brown. Document Released: 11/17/2000 Document Revised: 02/12/2012 Document Reviewed: 07/06/2008 ExitCare Patient Information 2014 [place].  _______________________________________________________________________  Incentive Spirometer  An incentive spirometer is a tool that can help keep your lungs clear and active. This tool measures how well you are filling your lungs with each  breath. Taking long deep breaths may help reverse or decrease the chance of developing breathing (pulmonary) problems (especially infection) following:  A long period of time when you are unable to move or be active. BEFORE THE PROCEDURE   If the spirometer includes an indicator to show your best effort, your nurse or respiratory therapist will set it to a desired goal.  If possible, sit up straight or lean slightly forward. Try not to slouch.  Hold the incentive spirometer in an upright position. INSTRUCTIONS FOR USE  3. Sit on the edge of your bed if possible, or sit up as far as you can in bed or on a chair. 4. Hold the incentive spirometer in an upright position. 5. Breathe out normally. 6. Place the mouthpiece in your mouth and seal your lips tightly around it. 7. Breathe in slowly and as deeply as possible, raising the piston or the ball toward the top of the column. 8. Hold your breath for 3-5 seconds or for as long as possible. Allow the piston or ball to fall to the bottom of the column. 9. Remove the mouthpiece from your mouth and breathe out normally. 10. Rest for a few seconds and repeat Steps 1 through 7 at least 10 times every 1-2 hours when you are awake. Take your time and take a few normal breaths between deep breaths. 11. The spirometer may include an indicator to show your best effort. Use the indicator as a goal to work  toward during each repetition. 12. After each set of 10 deep breaths, practice coughing to be sure your lungs are clear. If you have an incision (the cut made at the time of surgery), support your incision when coughing by placing a pillow or rolled up towels firmly against it. Once you are able to get out of bed, walk around indoors and cough well. You may stop using the incentive spirometer when instructed by your caregiver.  RISKS AND COMPLICATIONS  Take your time so you do not get dizzy or light-headed.  If you are in pain, you may need to take or ask  for pain medication before doing incentive spirometry. It is harder to take a deep breath if you are having pain. AFTER USE  Rest and breathe slowly and easily.  It can be helpful to keep track of a log of your progress. Your caregiver can provide you with a simple table to help with this. If you are using the spirometer at home, follow these instructions: [place] IF:   You are having difficultly using the spirometer.  You have trouble using the spirometer as often as instructed.  Your pain medication is not giving enough relief while using the spirometer.  You develop fever of 100.5 F (38.1 C) or higher. SEEK IMMEDIATE MEDICAL CARE IF:   You cough up bloody sputum that had not been present before.  You develop fever of 102 F (38.9 C) or greater.  You develop worsening pain at or near the incision site. MAKE SURE YOU:   Understand these instructions.  Will watch your condition.  Will get help right away if you are not doing well or get worse. Document Released: 04/02/2007 Document Revised: 02/12/2012 Document Reviewed: 06/03/2007 [facility] Patient Information 2014 [place], [place].   ________________________________________________________________________

## 2016-04-12 NOTE — Telephone Encounter (Signed)
Patient contacted and updated with location for her scheduled surgery was changes from [place] patient location to [facility] . Patient also informed that a Pre-Op Testing appointment is required , patient states understanding . Patient denies further questions or concerns at this time .

## 2016-04-14 ENCOUNTER — Encounter (HOSPITAL_COMMUNITY): Payer: Self-pay

## 2016-04-14 ENCOUNTER — Encounter (HOSPITAL_COMMUNITY)
Admission: RE | Admit: 2016-04-14 | Discharge: 2016-04-14 | Disposition: A | Discharge status: Home / Self Care | Payer: Medicare Other | Source: Ambulatory Visit | Attending: General Surgery | Admitting: General Surgery

## 2016-04-14 DIAGNOSIS — I10 Essential (primary) hypertension: Secondary | ICD-10-CM | POA: Diagnosis not present

## 2016-04-14 DIAGNOSIS — Z01812 Encounter for preprocedural laboratory examination: Secondary | ICD-10-CM | POA: Diagnosis not present

## 2016-04-14 DIAGNOSIS — E119 Type 2 diabetes mellitus without complications: Secondary | ICD-10-CM | POA: Insufficient documentation

## 2016-04-14 DIAGNOSIS — Z01810 Encounter for preprocedural cardiovascular examination: Secondary | ICD-10-CM | POA: Insufficient documentation

## 2016-04-14 HISTORY — DX: Personal history of other medical treatment: Z92.89

## 2016-04-14 HISTORY — DX: Unspecified convulsions: R56.9

## 2016-04-14 HISTORY — DX: Anemia, unspecified: D64.9

## 2016-04-14 HISTORY — DX: Cardiac murmur, unspecified: R01.1

## 2016-04-14 LAB — CBC
HEMATOCRIT: 32.6 % — AB (ref 36.0–46.0)
HEMOGLOBIN: 10.9 g/dL — AB (ref 12.0–15.0)
MCH: 31.4 pg (ref 26.0–34.0)
MCHC: 33.4 g/dL (ref 30.0–36.0)
MCV: 93.9 fL (ref 78.0–100.0)
Platelets: 155 10*3/uL (ref 150–400)
RBC: 3.47 MIL/uL — ABNORMAL LOW (ref 3.87–5.11)
RDW: 14.5 % (ref 11.5–15.5)
WBC: 4.5 10*3/uL (ref 4.0–10.5)

## 2016-04-14 LAB — COMPREHENSIVE METABOLIC PANEL
ALBUMIN: 4 g/dL (ref 3.5–5.0)
ALK PHOS: 109 U/L (ref 38–126)
ALT: 15 U/L (ref 14–54)
ANION GAP: 8 (ref 5–15)
AST: 21 U/L (ref 15–41)
BUN: 15 mg/dL (ref 6–20)
CALCIUM: 9.2 mg/dL (ref 8.9–10.3)
CO2: 30 mmol/L (ref 22–32)
Chloride: 102 mmol/L (ref 101–111)
Creatinine, Ser: 0.96 mg/dL (ref 0.44–1.00)
GFR calc Af Amer: >60 mL/min (ref >60)
GFR calc non Af Amer: >60 mL/min (ref >60)
GLUCOSE: 143 mg/dL — AB (ref 65–99)
POTASSIUM: 3.9 mmol/L (ref 3.5–5.1)
SODIUM: 140 mmol/L (ref 135–145)
Total Bilirubin: 0.5 mg/dL (ref 0.3–1.2)
Total Protein: 7.5 g/dL (ref 6.5–8.1)

## 2016-04-14 LAB — ABO/RH: ABO/RH(D): O POS

## 2016-04-14 NOTE — Progress Notes (Addendum)
Final EKg done 04/14/2016 shown to Dr Smith Robert ( anesthesia) along with EKG from 07/2015.    No new orders given.

## 2016-04-14 NOTE — Progress Notes (Signed)
08/06/2015- EKG- EPIC  04/06/2016- Hep fct panel/DBD/Diff/ Metabolic Panel

## 2016-04-14 NOTE — Progress Notes (Signed)
CBC done 04/14/16 faxed via EPIC to Dr Rolanda Jay.

## 2016-04-15 LAB — HEMOGLOBIN A1C
HEMOGLOBIN A1C: 6.6 % — AB (ref 4.8–5.6)
MEAN PLASMA GLUCOSE: 143 mg/dL

## 2016-04-19 ENCOUNTER — Ambulatory Visit (HOSPITAL_COMMUNITY): Payer: Medicare Other | Admitting: Anesthesiology

## 2016-04-19 ENCOUNTER — Encounter (HOSPITAL_COMMUNITY): Payer: Self-pay | Admitting: *Deleted

## 2016-04-19 ENCOUNTER — Encounter (HOSPITAL_COMMUNITY)
Admission: RE | Disposition: A | Discharge status: Home / Self Care | Payer: Self-pay | Source: Ambulatory Visit | Attending: General Surgery

## 2016-04-19 ENCOUNTER — Ambulatory Visit (HOSPITAL_COMMUNITY)
Admission: RE | Admit: 2016-04-19 | Discharge: 2016-04-19 | Disposition: A | Discharge status: Home / Self Care | Payer: Medicare Other | Source: Ambulatory Visit | Attending: General Surgery | Admitting: General Surgery

## 2016-04-19 DIAGNOSIS — M170 Bilateral primary osteoarthritis of knee: Secondary | ICD-10-CM | POA: Insufficient documentation

## 2016-04-19 DIAGNOSIS — E119 Type 2 diabetes mellitus without complications: Secondary | ICD-10-CM | POA: Diagnosis not present

## 2016-04-19 DIAGNOSIS — Z79899 Other long term (current) drug therapy: Secondary | ICD-10-CM | POA: Insufficient documentation

## 2016-04-19 DIAGNOSIS — Z87891 Personal history of nicotine dependence: Secondary | ICD-10-CM | POA: Diagnosis not present

## 2016-04-19 DIAGNOSIS — Z8541 Personal history of malignant neoplasm of cervix uteri: Secondary | ICD-10-CM | POA: Diagnosis not present

## 2016-04-19 DIAGNOSIS — I10 Essential (primary) hypertension: Secondary | ICD-10-CM | POA: Insufficient documentation

## 2016-04-19 DIAGNOSIS — Z7982 Long term (current) use of aspirin: Secondary | ICD-10-CM | POA: Diagnosis not present

## 2016-04-19 DIAGNOSIS — K389 Disease of appendix, unspecified: Secondary | ICD-10-CM

## 2016-04-19 DIAGNOSIS — K388 Other specified diseases of appendix: Secondary | ICD-10-CM | POA: Diagnosis present

## 2016-04-19 DIAGNOSIS — C181 Malignant neoplasm of appendix: Secondary | ICD-10-CM | POA: Insufficient documentation

## 2016-04-19 DIAGNOSIS — Z791 Long term (current) use of non-steroidal anti-inflammatories (NSAID): Secondary | ICD-10-CM | POA: Diagnosis not present

## 2016-04-19 DIAGNOSIS — E785 Hyperlipidemia, unspecified: Secondary | ICD-10-CM | POA: Insufficient documentation

## 2016-04-19 DIAGNOSIS — M1990 Unspecified osteoarthritis, unspecified site: Secondary | ICD-10-CM | POA: Diagnosis not present

## 2016-04-19 DIAGNOSIS — Z923 Personal history of irradiation: Secondary | ICD-10-CM | POA: Insufficient documentation

## 2016-04-19 DIAGNOSIS — R569 Unspecified convulsions: Secondary | ICD-10-CM | POA: Insufficient documentation

## 2016-04-19 DIAGNOSIS — Z9221 Personal history of antineoplastic chemotherapy: Secondary | ICD-10-CM | POA: Insufficient documentation

## 2016-04-19 HISTORY — PX: LAPAROSCOPIC APPENDECTOMY: SHX408

## 2016-04-19 LAB — TYPE AND SCREEN
ABO/RH(D): O POS
ANTIBODY SCREEN: NEGATIVE

## 2016-04-19 LAB — GLUCOSE, CAPILLARY
GLUCOSE-CAPILLARY: 143 mg/dL — AB (ref 65–99)
Glucose-Capillary: 154 mg/dL — ABNORMAL HIGH (ref 65–99)

## 2016-04-19 SURGERY — APPENDECTOMY, LAPAROSCOPIC
Anesthesia: General | Site: Abdomen

## 2016-04-19 MED ORDER — LIDOCAINE HCL (CARDIAC) 20 MG/ML IV SOLN
INTRAVENOUS | Status: DC | PRN
  Administered 2016-04-19: 100 mg via INTRAVENOUS

## 2016-04-19 MED ORDER — HYDROMORPHONE HCL 1 MG/ML IJ SOLN
INTRAMUSCULAR | Status: AC
  Filled 2016-04-19: qty 1

## 2016-04-19 MED ORDER — MIDAZOLAM HCL 2 MG/2ML IJ SOLN
INTRAMUSCULAR | Status: AC
  Filled 2016-04-19: qty 2

## 2016-04-19 MED ORDER — PROPOFOL 10 MG/ML IV BOLUS
INTRAVENOUS | Status: DC | PRN
  Administered 2016-04-19: 100 mg via INTRAVENOUS

## 2016-04-19 MED ORDER — OXYCODONE-ACETAMINOPHEN 5-325 MG PO TABS: 1.0000 | ORAL_TABLET | Freq: Four times a day (QID) | ORAL | Status: DC | PRN

## 2016-04-19 MED ORDER — CEFAZOLIN SODIUM-DEXTROSE 2-4 GM/100ML-% IV SOLN
2.0000 g | INTRAVENOUS | Status: AC
  Administered 2016-04-19: 2 g via INTRAVENOUS
  Filled 2016-04-19: qty 100

## 2016-04-19 MED ORDER — DEXAMETHASONE SODIUM PHOSPHATE 10 MG/ML IJ SOLN
INTRAMUSCULAR | Status: DC | PRN
  Administered 2016-04-19: 10 mg via INTRAVENOUS

## 2016-04-19 MED ORDER — SUCCINYLCHOLINE CHLORIDE 20 MG/ML IJ SOLN
INTRAMUSCULAR | Status: DC | PRN
  Administered 2016-04-19: 100 mg via INTRAVENOUS

## 2016-04-19 MED ORDER — ACETAMINOPHEN 10 MG/ML IV SOLN
INTRAVENOUS | Status: DC | PRN
  Administered 2016-04-19: 1000 mg via INTRAVENOUS

## 2016-04-19 MED ORDER — SODIUM CHLORIDE 0.9 % IJ SOLN
INTRAMUSCULAR | Status: AC
  Filled 2016-04-19: qty 10

## 2016-04-19 MED ORDER — PROPOFOL 10 MG/ML IV BOLUS
INTRAVENOUS | Status: AC
  Filled 2016-04-19: qty 20

## 2016-04-19 MED ORDER — HYDROMORPHONE HCL 1 MG/ML IJ SOLN
0.2500 mg | INTRAMUSCULAR | Status: DC | PRN
Start: 2016-04-19 — End: 2016-04-19
  Administered 2016-04-19 (×2): 0.5 mg via INTRAVENOUS

## 2016-04-19 MED ORDER — HYDROMORPHONE HCL 2 MG/ML IJ SOLN
INTRAMUSCULAR | Status: AC
  Filled 2016-04-19: qty 1

## 2016-04-19 MED ORDER — HYDROMORPHONE HCL 1 MG/ML IJ SOLN
INTRAMUSCULAR | Status: DC | PRN
  Administered 2016-04-19: .4 mg via INTRAVENOUS

## 2016-04-19 MED ORDER — CEFAZOLIN SODIUM-DEXTROSE 2-4 GM/100ML-% IV SOLN
INTRAVENOUS | Status: AC
  Filled 2016-04-19: qty 100

## 2016-04-19 MED ORDER — FENTANYL CITRATE (PF) 100 MCG/2ML IJ SOLN
INTRAMUSCULAR | Status: DC | PRN
  Administered 2016-04-19: 100 ug via INTRAVENOUS
  Administered 2016-04-19 (×3): 50 ug via INTRAVENOUS

## 2016-04-19 MED ORDER — MIDAZOLAM HCL 2 MG/2ML IJ SOLN
INTRAMUSCULAR | Status: DC | PRN
  Administered 2016-04-19: 2 mg via INTRAVENOUS

## 2016-04-19 MED ORDER — LIDOCAINE HCL (CARDIAC) 20 MG/ML IV SOLN
INTRAVENOUS | Status: AC
  Filled 2016-04-19: qty 5

## 2016-04-19 MED ORDER — ACETAMINOPHEN 10 MG/ML IV SOLN
INTRAVENOUS | Status: AC
  Filled 2016-04-19: qty 100

## 2016-04-19 MED ORDER — BUPIVACAINE-EPINEPHRINE 0.25% -1:200000 IJ SOLN
INTRAMUSCULAR | Status: DC | PRN
  Administered 2016-04-19: 24 mL

## 2016-04-19 MED ORDER — SUGAMMADEX SODIUM 200 MG/2ML IV SOLN
INTRAVENOUS | Status: DC | PRN
  Administered 2016-04-19: 200 mg via INTRAVENOUS

## 2016-04-19 MED ORDER — BUPIVACAINE-EPINEPHRINE (PF) 0.25% -1:200000 IJ SOLN
INTRAMUSCULAR | Status: AC
  Filled 2016-04-19: qty 30

## 2016-04-19 MED ORDER — ONDANSETRON HCL 4 MG/2ML IJ SOLN
INTRAMUSCULAR | Status: AC
  Filled 2016-04-19: qty 2

## 2016-04-19 MED ORDER — EPHEDRINE SULFATE 50 MG/ML IJ SOLN
INTRAMUSCULAR | Status: DC | PRN
  Administered 2016-04-19: 10 mg via INTRAVENOUS

## 2016-04-19 MED ORDER — NALOXONE HCL 0.4 MG/ML IJ SOLN
INTRAMUSCULAR | Status: AC
Start: 2016-04-19 — End: 2016-04-19
  Filled 2016-04-19: qty 1

## 2016-04-19 MED ORDER — LACTATED RINGERS IV SOLN
INTRAVENOUS | Status: DC | PRN
  Administered 2016-04-19 (×2): via INTRAVENOUS

## 2016-04-19 MED ORDER — FENTANYL CITRATE (PF) 250 MCG/5ML IJ SOLN
INTRAMUSCULAR | Status: AC
  Filled 2016-04-19: qty 5

## 2016-04-19 MED ORDER — SUGAMMADEX SODIUM 200 MG/2ML IV SOLN
INTRAVENOUS | Status: AC
  Filled 2016-04-19: qty 2

## 2016-04-19 MED ORDER — LACTATED RINGERS IR SOLN
Status: DC | PRN
  Administered 2016-04-19: 1000 mL

## 2016-04-19 MED ORDER — ONDANSETRON HCL 4 MG/2ML IJ SOLN
INTRAMUSCULAR | Status: DC | PRN
  Administered 2016-04-19: 4 mg via INTRAVENOUS

## 2016-04-19 MED ORDER — ROCURONIUM BROMIDE 100 MG/10ML IV SOLN
INTRAVENOUS | Status: DC | PRN
  Administered 2016-04-19 (×2): 10 mg via INTRAVENOUS
  Administered 2016-04-19: 25 mg via INTRAVENOUS

## 2016-04-19 MED FILL — OXYCODONE/APAP 5/325 MG TAB: 5-325 | 5 days supply | Qty: 20 | Fill #0

## 2016-04-19 SURGICAL SUPPLY — 39 items
CHLORAPREP W/TINT 26ML (MISCELLANEOUS) ×4 IMPLANT
CLOSURE WOUND 1/2 X4 (GAUZE/BANDAGES/DRESSINGS)
COVER SURGICAL LIGHT HANDLE (MISCELLANEOUS) ×4 IMPLANT
DECANTER SPIKE VIAL GLASS SM (MISCELLANEOUS) IMPLANT
DERMABOND ADVANCED (GAUZE/BANDAGES/DRESSINGS) ×2
DERMABOND ADVANCED .7 DNX12 (GAUZE/BANDAGES/DRESSINGS) ×2 IMPLANT
DRAPE LAPAROSCOPIC ABDOMINAL (DRAPES) ×4 IMPLANT
ELECT REM PT RETURN 9FT ADLT (ELECTROSURGICAL) ×4
ELECTRODE REM PT RTRN 9FT ADLT (ELECTROSURGICAL) ×2 IMPLANT
GLOVE BIOGEL PI IND STRL 7.5 (GLOVE) ×2 IMPLANT
GLOVE BIOGEL PI INDICATOR 7.5 (GLOVE) ×2
GLOVE ORTHO TXT STRL SZ7.5 (GLOVE) ×4 IMPLANT
GOWN STRL REUS W/TWL XL LVL3 (GOWN DISPOSABLE) ×12 IMPLANT
KIT BASIN OR (CUSTOM PROCEDURE TRAY) ×4 IMPLANT
LIQUID BAND (GAUZE/BANDAGES/DRESSINGS) ×4 IMPLANT
RELOAD 45 VASCULAR/THIN (ENDOMECHANICALS) ×4 IMPLANT
RELOAD STAPLE TA45 3.5 REG BLU (ENDOMECHANICALS) ×4 IMPLANT
SCISSORS LAP 5X35 DISP (ENDOMECHANICALS) IMPLANT
SET IRRIG TUBING LAPAROSCOPIC (IRRIGATION / IRRIGATOR) IMPLANT
SHEARS HARMONIC ACE PLUS 36CM (ENDOMECHANICALS) IMPLANT
SLEEVE XCEL OPT CAN 5 100 (ENDOMECHANICALS) ×4 IMPLANT
STRIP CLOSURE SKIN 1/2X4 (GAUZE/BANDAGES/DRESSINGS) IMPLANT
SUT MNCRL AB 4-0 PS2 18 (SUTURE) IMPLANT
SUT SILK 2 0 SH (SUTURE) IMPLANT
SUT VIC AB 2-0 CT1 27 (SUTURE)
SUT VIC AB 2-0 CT1 TAPERPNT 27 (SUTURE) IMPLANT
SUT VIC AB 2-0 UR6 27 (SUTURE) IMPLANT
SUT VIC AB 3-0 SH 27 (SUTURE) ×2
SUT VIC AB 3-0 SH 27X BRD (SUTURE) ×2 IMPLANT
SUT VICRYL 0 UR6 27IN ABS (SUTURE) IMPLANT
TOWEL OR 17X26 10 PK STRL BLUE (TOWEL DISPOSABLE) ×4 IMPLANT
TOWEL OR NON WOVEN STRL DISP B (DISPOSABLE) ×4 IMPLANT
TRAY FOLEY W/METER SILVER 14FR (SET/KITS/TRAYS/PACK) IMPLANT
TRAY FOLEY W/METER SILVER 16FR (SET/KITS/TRAYS/PACK) IMPLANT
TRAY LAPAROSCOPIC (CUSTOM PROCEDURE TRAY) ×4 IMPLANT
TROCAR BLADELESS OPT 5 100 (ENDOMECHANICALS) ×8 IMPLANT
TROCAR XCEL 12X100 BLDLESS (ENDOMECHANICALS) ×4 IMPLANT
TROCAR XCEL BLUNT TIP 100MML (ENDOMECHANICALS) IMPLANT
TUBING INSUF HEATED (TUBING) ×8 IMPLANT

## 2016-04-19 NOTE — Transfer of Care (Signed)
Immediate Anesthesia Transfer of Care Note  Patient: Lori [name]  Procedure(s) Performed: Procedure(s): APPENDECTOMY LAPAROSCOPIC (N/A)  Patient Location: PACU  Anesthesia Type:General  Level of Consciousness: awake  Airway & Oxygen Therapy: Patient Spontanous Breathing and Patient connected to face mask oxygen  Post-op Assessment: Report given to RN and Post -op Vital signs reviewed and stable  Post vital signs: Reviewed and stable  Last Vitals:  Filed Vitals:   04/19/16 1118  BP: 138/79  Pulse: 63  Temp: 36.7 C  Resp: 18    Last Pain:  Filed Vitals:   04/19/16 1203  PainSc: 4       Patients Stated Pain Goal:  (unable) [id number])  Complications: No apparent anesthesia complications

## 2016-04-19 NOTE — Anesthesia Procedure Notes (Signed)
Procedure Name: Intubation Date/Time: 04/19/2016 1:05 PM Performed by: Danley Danker L Patient Re-evaluated:Patient Re-evaluated prior to inductionOxygen Delivery Method: Circle system utilized Preoxygenation: Pre-oxygenation with 100% oxygen Intubation Type: IV induction Ventilation: Mask ventilation without difficulty and Oral airway inserted - appropriate to patient size Laryngoscope Size: Sabra Heck and 2 Grade View: Grade I Tube type: Oral Tube size: 7.5 mm Number of attempts: 1 Airway Equipment and Method: Stylet Placement Confirmation: ETT inserted through vocal cords under direct vision,  positive ETCO2 and breath sounds checked- equal and bilateral Secured at: 21 cm Tube secured with: Tape Dental Injury: Teeth and Oropharynx as per pre-operative assessment

## 2016-04-19 NOTE — H&P (View-Only) (Signed)
I had the pleasure of meeting with [name] today Donna Christen regarding evaluation of a slightly dilated and hypermetabolic appendix that was seen on her imaging. As it had been some time since her prior imaging I have requested that she get a repeat PET/CT scan.  IMPRESSION: 1. Interval decrease in FDG uptake associated with retroperitoneal hypermetabolic lymph node reported on previous exam. Findings compatible with response to therapy. 2. There is a focal area of increased uptake involving the skin overlying the lateral aspect of the left hip. This is favored to represent an inflammatory or infectious process. Cutaneous metastasis is considered less favored. 3. Mild increased uptake within the left angle region, likely reactive. 4. Decrease in degree of distention associated with the endometrial cavity 5. Unchanged appearance of the distal appendix which appears thickened and exhibits mild increased uptake.   Electronically Signed  By: Kerby Moors M.D.  On: 04/04/2016 15:23  I reviewed these findings with the patient and indicated that the appendix is still dilated with abnormal uptake. She understands that we cannot be sure if this is representative of malignancy. I reviewed the anatomic location of the appendix, how a laparoscopic versus open appendectomy would be performed. After reviewing the risk and benefits of the procedure she is comfortable moving forward with laparoscopic possible open appendectomy. She fully understands the possibility of a normal appendix following resection. We will be scheduling her for surgery in the near future.

## 2016-04-19 NOTE — Anesthesia Postprocedure Evaluation (Signed)
Anesthesia Post Note  Patient: Lori [name]  Procedure(s) Performed: Procedure(s) (LRB): APPENDECTOMY LAPAROSCOPIC (N/A)  Patient location during evaluation: PACU Anesthesia Type: General Level of consciousness: awake and alert Pain management: pain level controlled Vital Signs Assessment: post-procedure vital signs reviewed and stable Respiratory status: spontaneous breathing, nonlabored ventilation and respiratory function stable Cardiovascular status: blood pressure returned to baseline and stable Postop Assessment: no signs of nausea or vomiting Anesthetic complications: no    Last Vitals:  Filed Vitals:   04/19/16 1500 04/19/16 1515  BP: 148/80 132/85  Pulse: 61 58  Temp:  36.6 C  Resp: 11 13    Last Pain:  Filed Vitals:   04/19/16 1525  PainSc: 0-No pain                 [name],W. EDMOND

## 2016-04-19 NOTE — Op Note (Signed)
   Pre-operative Diagnosis: Distended Appendix   Procedure: Laparoscopic Appendectomy  Post-operative Diagnosis: Same  Surgeon: Malachi Bonds  Anesthesia: General with 1/4% [place]  IVF: 1100 ml  EBL: [id number]  Complications: None; patient tolerated the procedure well.   Disposition: PACU - hemodynamically stable.   Condition: Stable   Indication for Procedure: The patient is a 65 year old lady with history of cervical cancer who was found on subsequent imaging to have a persistently dilated appendix that had low-grade uptake on PET CT scan. For this reason there was a concern of malignancy and requested to remove the appendix for definitive diagnosis.  Operative Findings: Dilated distal third of the appendix with an area of dense scar tissue surrounding it requiring extended dissection to mobilize the appendix.  Procedure in Detail: After informed consent was obtained the patient was identified taken to the operating room and placed in supine position. After adequate anesthesia was achieved she was prepped and draped in standard sterile fashion. The umbilical area was treated using local anesthetic and an open Hassan technique was used to access the perineal cavity a 10 mm trocar was placed and normal insufflation pressures were achieved. Laparoscope was inserted and inspection the abdominal cavity revealed no abnormalities and no evidence of injury. A second trocar (5 mm)  was placed in the right mid abdomen after being infiltrated using local anesthetic. A third trocar was placed in the low mid abdomen after being infiltrated using local anesthetic under direct visualization. The cecum was identified and traced down to the base of the appendix and the retroperitoneal attachments were divided sharply. The cecum was mobilized and we were able to visualize entire length of the appendix. It was noted to have a dilated firm distal third and became dilated and the area where there was  dense white scar tissue. This was completely mobilized to allow the mesoappendix to be divided using GIA vascular load under direct visualization. Once the base of being completely freed it was divided using a blue load with the GIA stapler. The appendix was then placed into a Endobag and brought out through the umbilical incision and passed off the field as specimen. The base of the appendix was verified to be stapled and sealed with no evidence of leak and it was irrigated using copious amounts of warm irrigation and verified to be hemostatic. All trochars were removed after pneumoperitoneum was relieved and the umbilical fascia was reapproximated using 0 Vicryl suture. The subcuticular layers were closed using interrupted 3-0 Vicryl suture followed by Dermabond. At the end of the case sponge and needle counts were prescribed 2. I was present scrubbed for all key portions operation.

## 2016-04-19 NOTE — Anesthesia Preprocedure Evaluation (Addendum)
Anesthesia Evaluation  Patient identified by MRN, date of birth, ID band Patient awake    Reviewed: Allergy & Precautions, H&P , NPO status , Patient's Chart, lab work & pertinent test results, reviewed documented beta blocker date and time   Airway Mallampati: II  TM Distance: >3 FB Neck ROM: Full    Dental no notable dental hx. (+) Partial Lower, Partial Upper, Dental Advisory Given   Pulmonary neg pulmonary ROS, former smoker,    Pulmonary exam normal breath sounds clear to auscultation       Cardiovascular hypertension, Pt. on medications and Pt. on home beta blockers  Rhythm:Regular Rate:Normal     Neuro/Psych Seizures -, Well Controlled,  Depression    GI/Hepatic Neg liver ROS, GERD  ,  Endo/Other  diabetes  Renal/GU negative Renal ROS   Cervical/Endometrial CA negative genitourinary   Musculoskeletal  (+) Arthritis , Osteoarthritis,    Abdominal   Peds  Hematology negative hematology ROS (+) anemia ,   Anesthesia Other Findings   Reproductive/Obstetrics negative OB ROS                            Anesthesia Physical Anesthesia Plan  ASA: III  Anesthesia Plan: General   Post-op Pain Management:    Induction: Intravenous  Airway Management Planned: Oral ETT  Additional Equipment:   Intra-op Plan:   Post-operative Plan: Extubation in OR  Informed Consent: I have reviewed the patients History and Physical, chart, labs and discussed the procedure including the risks, benefits and alternatives for the proposed anesthesia with the patient or authorized representative who has indicated his/her understanding and acceptance.   Dental advisory given  Plan Discussed with: CRNA  Anesthesia Plan Comments:         Anesthesia Quick Evaluation

## 2016-04-19 NOTE — Interval H&P Note (Signed)
History and Physical Interval Note:  04/19/2016 12:15 PM  Lori [name]  has presented today for surgery, with the diagnosis of ABNORMAL HYPERMETABOLIC  APPENDIX,SLIGHTLY DILATED  The various methods of treatment have been discussed with the patient and family. After consideration of risks, benefits and other options for treatment, the patient has consented to  Procedure(s): APPENDECTOMY LAPAROSCOPIC (N/A) POSSIBLE EXPLORATORY LAPAROTOMY (N/A) as a surgical intervention .  The patient's history has been reviewed, patient examined, no change in status, stable for surgery.  I have reviewed the patient's chart and labs.  Questions were answered to the patient's satisfaction.     Frederich Cha

## 2016-04-19 NOTE — Discharge Instructions (Signed)
Laparoscopic Appendectomy, Adult Appendectomy is surgery to remove the appendix. Laparoscopic surgery uses several small cuts (incisions) instead of one large incision. Laparoscopic surgery offers a shorter recovery time and less discomfort. LET YOUR CAREGIVER KNOW ABOUT:  Allergies to food or medicine.  Medicines taken, including vitamins, dietary supplements, herbs, eyedrops, over-the-counter medicines, and creams.  Use of steroids (by mouth or creams).  Previous problems with anesthetics or numbing medicines.  History of bleeding problems or blood clots.  Previous surgery.  Other health problems, including diabetes, heart problems, lung problems, and kidney problems.  Possibility of pregnancy, if this applies. RISKS AND COMPLICATIONS  Infection. A germ starts growing in the wound. This can usually be treated with antibiotics. In some cases, the wound will need to be opened and cleaned.  Bleeding.  Damage to other organs.  Sores (abscesses).  Chronic pain at the incision sites. This is defined as pain that lasts for more than 3 months.  Blood clots in the legs that may rarely travel to the lungs.  Infection in the lungs (pneumonia). BEFORE THE PROCEDURE Appendectomy is usually performed immediately after an inflamed appendix (appendicitis) is diagnosed. No preparation is necessary ahead of this procedure. PROCEDURE  You will be given medicine that makes you sleep (general anesthetic). After you are asleep, a flexible tube (catheter) may be inserted into your bladder to drain your urine during surgery. The tube is removed before you wake up after surgery. When you are asleep, carbondioxide gas will be used to inflate your abdomen. This will allow your surgeon to see inside your abdomen and perform your surgery. Three small incisions will be made in your abdomen. Your surgeon will insert a thin, lighted tube (laparoscope) through one of the incisions. Your surgeon will look  through the laparoscope while performing the surgery. Other tools will be inserted through the other incisions. Laparoscopic procedures may not be appropriate when:  There is major scarring from a previous surgery.  The patient has bleeding disorders.  A pregnancy is near term.  There are other conditions which make the laparoscopic procedure impossible, such as an advanced infection or a ruptured appendix. If your surgeon feels it is not safe to continue with the laparoscopic procedure, he or she will perform an open surgery instead. This gives the surgeon a larger view and more space to work. Open surgery requires a longer recovery time. After your appendix is removed, your incisions will be closed with stitches (sutures) or skin adhesive. AFTER THE PROCEDURE You will be taken to a recovery room. When the anesthesia has worn off, you will be returned to your hospital room. You will be given pain medicines to keep you comfortable. Ask your caregiver how long your hospital stay will be.   This information is not intended to replace advice given to you by your health care provider. Make sure you discuss any questions you have with your health care provider.   Document Released: 07/04/2004 Document Revised: 12/11/2014 Document Reviewed: 05/10/2015 Elsevier Interactive Patient Education 2016 [employer].  Follow up with MD in office as directed.  Liquid adhesive will flake off.  General Anesthesia, Adult, Care After Refer to this sheet in the next few weeks. These instructions provide you with information on caring for yourself after your procedure. Your health care provider may also give you more specific instructions. Your treatment has been planned according to current medical practices, but problems sometimes occur. Call your health care provider if you have any problems or questions after  your procedure. WHAT TO EXPECT AFTER THE PROCEDURE After the procedure, it is typical to  experience:  Sleepiness.  Nausea and vomiting. HOME CARE INSTRUCTIONS  For the first 24 hours after general anesthesia:  Have a responsible person with you.  Do not drive a car. If you are alone, do not take public transportation.  Do not drink alcohol.  Do not take medicine that has not been prescribed by your health care provider.  Do not sign important papers or make important decisions.  You may resume a normal diet and activities as directed by your health care provider.  Change bandages (dressings) as directed.  If you have questions or problems that seem related to general anesthesia, call the hospital and ask for the anesthetist or anesthesiologist on call. SEEK MEDICAL CARE IF:  You have nausea and vomiting that continue the day after anesthesia.  You develop a rash. SEEK IMMEDIATE MEDICAL CARE IF:   You have difficulty breathing.  You have chest pain.  You have any allergic problems.   This information is not intended to replace advice given to you by your health care provider. Make sure you discuss any questions you have with your health care provider.   Document Released: 02/26/2001 Document Revised: 12/11/2014 Document Reviewed: 03/20/2012 Elsevier Interactive Patient Education [employer].

## 2016-04-19 NOTE — H&P (Signed)
  Laparoscopic possible open appendectomy for a persistently dilated hypermetabolic appendix. No acute changes since her last visit. Lungs clear to auscultation with equal breath sounds Carter vessel system regular rate rhythm 2+ distal pulses. Abdomen soft, nontender, nondistended, no masses. Assessment: No acute changes with a history of gynecologic malignancy now with a persistently hypermetabolic dilated appendix with concern of malignancy.  Plan: Laparoscopic possible open appendectomy. She understands the possibility of a negative appendectomy. Risk and benefits reviewed at length. She agrees with the plan as outlined above.

## 2016-04-19 NOTE — Brief Op Note (Signed)
04/19/2016  2:32 PM  PATIENT:  Lori [name]  65 y.o. female  PRE-OPERATIVE DIAGNOSIS:  ABNORMAL HYPERMETABOLIC  APPENDIX,SLIGHTLY DILATED  POST-OPERATIVE DIAGNOSIS:  ABNORMAL HYPERMETABOLIC  APPENDIX,SLIGHTLY DILATED  PROCEDURE:  Procedure(s): APPENDECTOMY LAPAROSCOPIC (N/A)  SURGEON: T. Tonye Becket, M.D.    ANESTHESIA:   general  EBL:  Total I/O In: 1000 [I.V.:1000] Out: 600 [Urine:500; Blood:100]  BLOOD ADMINISTERED:none  DRAINS: none   LOCAL MEDICATIONS USED:  MARCAINE     SPECIMEN:  Appendix  DISPOSITION OF SPECIMEN:  PATHOLOGY  COUNTS:  YES  DICTATION: .Dragon Dictation  PLAN OF CARE: Discharge to home after PACU  PATIENT DISPOSITION:  PACU - hemodynamically stable.

## 2016-04-20 ENCOUNTER — Telehealth: Payer: Self-pay | Admitting: Gynecologic Oncology

## 2016-04-20 NOTE — Telephone Encounter (Signed)
Called to check on patient's post-op status.  Patient stating she is doing well.  Minimal pain.  Tolerating diet.  Bowels and bladder functioning.  Follow up appt arranged for May 30 with Dr. Rolanda Jay.  Advised to call if symptoms develop or she needs to be seen sooner.  Advised to call for any needs.  No concerns voiced.

## 2016-05-02 ENCOUNTER — Ambulatory Visit (HOSPITAL_BASED_OUTPATIENT_CLINIC_OR_DEPARTMENT_OTHER): Payer: Self-pay | Admitting: General Surgery

## 2016-05-02 ENCOUNTER — Encounter: Payer: Self-pay | Admitting: General Surgery

## 2016-05-02 VITALS — BP 140/71 | HR 63 | Temp 97.9°F | Resp 18 | Ht 68.0 in | Wt 200.8 lb

## 2016-05-02 DIAGNOSIS — C181 Malignant neoplasm of appendix: Secondary | ICD-10-CM

## 2016-05-02 DIAGNOSIS — Z9221 Personal history of antineoplastic chemotherapy: Secondary | ICD-10-CM

## 2016-05-02 DIAGNOSIS — C539 Malignant neoplasm of cervix uteri, unspecified: Secondary | ICD-10-CM

## 2016-05-02 DIAGNOSIS — Z923 Personal history of irradiation: Secondary | ICD-10-CM

## 2016-05-02 DIAGNOSIS — Z8541 Personal history of malignant neoplasm of cervix uteri: Secondary | ICD-10-CM

## 2016-05-02 NOTE — Progress Notes (Signed)
Interval history The patient is here in routine follow-up status post laparoscopic appendectomy for a persistently dilated distal appendix with low metabolic uptake on PET scan with a personal history of cervical cancer. She has done well since surgery and reports she feels a small not in her navel but has no specific pain or drainage. Physical examination On physical examination with a female chaperone in the room, all of her incisions are clean, dry and intact. There is a small nodule along the umbilical incision consistent with underlying suture without evidence of infection or drainage.  Copies of her pathology report were provided to her. 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 1 of 3 FINAL for Lori [name], Lori [name] [phone]) Microscopic Comment APPENDIX: Specimen: Appendix. Procedure: Appendectomy. Specimen Integrity: Disrupted. Specimen Size: 4.4 x 1.2 cm. Tumor Site: Appendix. Tumor Size: 2.2 cm Histologic Type: Mucinous adenocarcinoma Histologic Grade: Grade 2 (moderately differentiated) Microscopic Tumor Extension: Into periappendiceal connective tissue and serosal surface. Margins: Proximal Margin: Free of tumor. Mesenteric Margin: Involved by tumor. Adenoma present at proximal margin: N/A Lymph-Vascular Invasion: Not identified. Perineural Invasion: Yes Peritumoral Nodules (tumor deposits): No Lymph nodes: number examined 0; number positive: N/A TNM: pT4a , pNX Ancillary studies: Mismatch repair protein by immunohistochemistry. Comments: The tumor is centered on the appendiceal lumen and is a mucinous adenocarcinoma, which extends into the periappendiceal connective tissue and involves the serosal surface. (JDP:ecj 04/21/2016)  Assessment Patient with history of cervical cancer with a periaortic hypermetabolic lymph node  status post chemotherapy and radiation therapy and subsequent focused radiation therapy on the periaortic lymph node. On resection of the appendix for diagnosis she was found to have a pT4a, pNX moderately differentiated mucinous adenocarcinoma with extension into the peri-appendiceal connective tissue and serosal surface. Plan We will be referring the patient to Dr. Ancil Linsey (Medical Oncology at [facility]) We will present her case at the gynecologic tumor multidisciplinary conference to discuss possible adjuvant therapy and recommendation regarding newly diagnosed appendiceal lesion. The patient already has a follow-up appointment with Dr. Denman George. Follow-up appointment with me will be determined by the outcome of the consultation with Dr. Whitney Muse and the tumor multidisciplinary conference. This was explained to the patient who understands and agrees with the plan as outlined above.

## 2016-05-02 NOTE — Patient Instructions (Signed)
Plan to follow up with Dr. Denman George on Monday, June 5.  We also placed a referral for you to become established with Dr. Whitney Muse at [facility] for continuing care.  We will also present your case at our conference at the end of the month.  Please call for any questions or concerns.

## 2016-05-08 ENCOUNTER — Ambulatory Visit: Payer: Medicare Other | Attending: Gynecologic Oncology | Admitting: Gynecologic Oncology

## 2016-05-08 ENCOUNTER — Encounter: Payer: Self-pay | Admitting: Gynecologic Oncology

## 2016-05-08 VITALS — BP 155/72 | HR 59 | Temp 98.2°F | Resp 18 | Ht 68.0 in | Wt 203.8 lb

## 2016-05-08 DIAGNOSIS — Z923 Personal history of irradiation: Secondary | ICD-10-CM | POA: Diagnosis not present

## 2016-05-08 DIAGNOSIS — C181 Malignant neoplasm of appendix: Secondary | ICD-10-CM

## 2016-05-08 DIAGNOSIS — Z87891 Personal history of nicotine dependence: Secondary | ICD-10-CM | POA: Insufficient documentation

## 2016-05-08 DIAGNOSIS — K219 Gastro-esophageal reflux disease without esophagitis: Secondary | ICD-10-CM | POA: Diagnosis not present

## 2016-05-08 DIAGNOSIS — Z7982 Long term (current) use of aspirin: Secondary | ICD-10-CM | POA: Diagnosis not present

## 2016-05-08 DIAGNOSIS — Z91040 Latex allergy status: Secondary | ICD-10-CM | POA: Diagnosis not present

## 2016-05-08 DIAGNOSIS — Z8541 Personal history of malignant neoplasm of cervix uteri: Secondary | ICD-10-CM

## 2016-05-08 DIAGNOSIS — C539 Malignant neoplasm of cervix uteri, unspecified: Secondary | ICD-10-CM | POA: Diagnosis not present

## 2016-05-08 DIAGNOSIS — N950 Postmenopausal bleeding: Secondary | ICD-10-CM | POA: Diagnosis not present

## 2016-05-08 DIAGNOSIS — E785 Hyperlipidemia, unspecified: Secondary | ICD-10-CM | POA: Diagnosis not present

## 2016-05-08 DIAGNOSIS — Z91041 Radiographic dye allergy status: Secondary | ICD-10-CM | POA: Diagnosis not present

## 2016-05-08 DIAGNOSIS — F329 Major depressive disorder, single episode, unspecified: Secondary | ICD-10-CM | POA: Diagnosis not present

## 2016-05-08 DIAGNOSIS — D649 Anemia, unspecified: Secondary | ICD-10-CM | POA: Insufficient documentation

## 2016-05-08 DIAGNOSIS — Z888 Allergy status to other drugs, medicaments and biological substances status: Secondary | ICD-10-CM | POA: Insufficient documentation

## 2016-05-08 DIAGNOSIS — Z9109 Other allergy status, other than to drugs and biological substances: Secondary | ICD-10-CM | POA: Insufficient documentation

## 2016-05-08 DIAGNOSIS — E119 Type 2 diabetes mellitus without complications: Secondary | ICD-10-CM | POA: Insufficient documentation

## 2016-05-08 DIAGNOSIS — Z9889 Other specified postprocedural states: Secondary | ICD-10-CM | POA: Insufficient documentation

## 2016-05-08 DIAGNOSIS — Z9221 Personal history of antineoplastic chemotherapy: Secondary | ICD-10-CM | POA: Diagnosis not present

## 2016-05-08 DIAGNOSIS — I10 Essential (primary) hypertension: Secondary | ICD-10-CM | POA: Diagnosis not present

## 2016-05-08 DIAGNOSIS — Z8669 Personal history of other diseases of the nervous system and sense organs: Secondary | ICD-10-CM | POA: Diagnosis not present

## 2016-05-08 DIAGNOSIS — M1990 Unspecified osteoarthritis, unspecified site: Secondary | ICD-10-CM | POA: Insufficient documentation

## 2016-05-08 DIAGNOSIS — Z808 Family history of malignant neoplasm of other organs or systems: Secondary | ICD-10-CM | POA: Diagnosis not present

## 2016-05-08 DIAGNOSIS — Z8542 Personal history of malignant neoplasm of other parts of uterus: Secondary | ICD-10-CM | POA: Insufficient documentation

## 2016-05-08 NOTE — Progress Notes (Signed)
Follow-up Note: Gyn-Onc   [name] 65 y.o. female  CC:  Chief Complaint  Patient presents with  . Cervical Cancer    follow up  New diagnosis of appendiceal cancer.  Assessment/Plan:  Ms. Jeilin Mebane  is a 65 y.o.  year old with a history of clinical stage IIB endocervical adenocarcinoma. She is s/p primary chemoradiation with Dr Sondra Come and Dr Melchor Amour and additional radiation to a para-aortic lymph node that was increased PET positive after completing primary chemoradiation in September 2016.  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 Second primary based on pathology review/comparison. Recommendation is for chemotherapy per Surgical Oncologist, Dr Rolanda Jay. We will set her up to see Dr Whitney Muse for discussion regarding appropriate regimen and long-term management of this.  From a cervical stand point she has had a complete clinical response. We would have typically treated her para-aortic node with additional chemotherapy, however, given the more acute findings of new appendiceal cancer, chemotherapy choice should prioritize this new lesion.  I will see her back for cervical cancer survellance in September, 2017.   HPI: Hawo Pokorny is a 65 year old woman is seen in consultation at the request of Dr. Adah Perl for postmenopausal bleeding and adenocarcinoma.   The patient reports having postmenopausal bleeding sent March 2016. She was evaluated with a Pap smear in April 2016 which revealed ASC-H. she then presented for colposcopic evaluation at which time an abnormal cervix was visualized. An endometrial biopsy was taken and this revealed adenocarcinoma with special stains consistent with an endocervical primary (CEA and P 16 positivity). She underwent a CT scan of the abdomen and pelvis on 04/09/2015 and this revealed no lymphadenopathy, mild low attenuation thickening of the endometrium, no gross metastatic disease.  She underwent  PET/CT on 05/31/16 which showed intense cervical activity, a clinically positive left external iliac node and intense activity at the distal appendix with a thickened appendiceal wall (26mm) in this region.  She received definitive chemoradiation with external beam and brachytherapy and weekly cddp at [facility] with Dr's Kinard and Neijstrom. Treatment was completed on 08/26/15 without treatment delays.  Post-treatment PET CT on 01/12/16 showed complete response in the pelvic and pelvic nodal regions but a new Ill-defined adenopathy in the left periaortic station measures approximately 12 mm with an SUV max of 6.3. No additional hypermetabolic lymph nodes The appendiceal lesion continued to be PET positive.  Interval Hx: The para-aortic node received definitive radiation with Dr Sondra Come in [place] until March, 2017. Post-treatment repeat PET CT on 04/04/16 showed persistent increased avidity in the appendix but complete response at the PA nodal region.  On 04/19/16 Dr Rolanda Jay took the patient to the operating room for a laparoscopic appendectomy with final pathology revealing a mucinous adenocarcinoma extending into periappendiceal connective tissue and serosal surface of tumor. The proximal GI margin was free of tumor. The mesenteric margin was involved by tumor. There was perineural invasion present. It was staged as T4a. Comparison to her original cervical cancer revealed it to be a separate primary tumor.  Current Meds:  Outpatient Encounter Prescriptions as of 05/08/2016  Medication Sig  . amLODipine (NORVASC) 10 MG tablet Take 10 mg by mouth every morning.   Marland Kitchen aspirin EC 81 MG tablet Take 81 mg by mouth daily.  . benazepril (LOTENSIN) 40 MG tablet Take 40 mg by mouth every morning.   . carbamazepine (TEGRETOL) 100 MG chewable tablet Chew 100 mg by mouth 3 (three) times daily.   Marland Kitchen  cholecalciferol (VITAMIN D) 1000 units tablet Take 1,000 Units by mouth daily. 2 tablets daily  . cloNIDine  (CATAPRES) 0.2 MG tablet Take 0.2 mg by mouth 3 (three) times daily.   Marland Kitchen gabapentin (NEURONTIN) 300 MG capsule Take 300 mg by mouth 2 (two) times daily.   . hydrochlorothiazide (HYDRODIURIL) 25 MG tablet Take 25 mg by mouth every morning.   . hydrocortisone cream 1 % Apply topically 2 (two) times daily.  Marland Kitchen ibuprofen (ADVIL,MOTRIN) 400 MG tablet Take 400 mg by mouth every 6 (six) hours as needed (For pain.). Reported on 03/28/2016  . loperamide (IMODIUM A-D) 2 MG tablet Take 2 mg by mouth 4 (four) times daily as needed for diarrhea or loose stools. Reported on 03/28/2016  . magnesium oxide (MAG-OX) 400 (241.3 Mg) MG tablet Take 400 mg by mouth daily.   . metoprolol (LOPRESSOR) 50 MG tablet Take 25-50 mg by mouth 2 (two) times daily. Take half a tablet in the morning and half a tablet in the pm  . Multiple Vitamins-Minerals (MULTIVITAMIN WITH MINERALS) tablet Take 1 tablet by mouth daily.  Marland Kitchen oxyCODONE-acetaminophen (ROXICET) 5-325 MG tablet Take 1 tablet by mouth every 6 (six) hours as needed.  . potassium chloride SA (K-DUR,KLOR-CON) 20 MEQ tablet Take 20 mEq by mouth 2 (two) times daily.   . pravastatin (PRAVACHOL) 20 MG tablet Take 20 mg by mouth every evening.    No facility-administered encounter medications on file as of 05/08/2016.    Allergy:  Allergies  Allergen Reactions  . Contrast Media [Iodinated Diagnostic Agents] Itching and Swelling  . Dilantin [Phenytoin Sodium Extended] Itching, Swelling and Other (See Comments)    Whole body  . Latex Hives and Itching  . Adhesive [Tape] Rash    Social Hx:   Social History   Social History  . Marital Status: Single    Spouse Name: N/A  . Number of Children: N/A  . Years of Education: N/A   Occupational History  . Not on file.   Social History Main Topics  . Smoking status: Former Smoker -- 5 years    Types: Cigarettes    Quit date: 12/05/1995  . Smokeless tobacco: Never Used  . Alcohol Use: No  . Drug Use: No  . Sexual  Activity: Not on file   Other Topics Concern  . Not on file   Social History Narrative    Past Surgical Hx:  Past Surgical History  Procedure Laterality Date  . Craniotomy posterior fossa  1997    Repair aneurysm  x2  left side  . Port-a-cath placement  06/  2016  . Tandem ring insertion N/A 07/28/2015    Procedure: TANDEM RING PLACEMENT;  Surgeon: Gery Pray, MD;  Location: [facility];  Service: Urology;  Laterality: N/A;  . Tandem ring insertion N/A 08/05/2015    Procedure: TANDEM RING PLACEMENT ;  Surgeon: Gery Pray, MD;  Location[facility];  Service: Urology;  Laterality: N/A;  . Tandem ring insertion N/A 08/11/2015    Procedure: TANDEM RING PLACEMENT ;  Surgeon: Gery Pray, MD;  Location: [facility];  Service: Urology;  Laterality: N/A;  . Tandem ring insertion N/A 08/17/2015    Procedure: TANDEM RING PLACEMENT ;  Surgeon: Gery Pray, MD;  Location: [facility];  Service: Urology;  Laterality: N/A;  . Tandem ring insertion N/A 08/26/2015    Procedure: TANDEM RING PLACEMENT ;  Surgeon: Gery Pray, MD;  Location: [facility];  Service: Urology;  Laterality: N/A;  . Colonoscopy    . Laparoscopic appendectomy N/A 04/19/2016    Procedure: APPENDECTOMY LAPAROSCOPIC;  Surgeon: Hall Busing, MD;  Location: [facility];  Service: General;  Laterality: N/A;    Past Medical Hx:  Past Medical History  Diagnosis Date  . Depression   . Hypertension   . Hyperlipidemia   . 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 PMB (postmenopausal bleeding)   . History of seizures     post-op craniotomy for aneurysm 1997-- controlled w/ medication since then no seizures  . GERD (gastroesophageal reflux disease)   . Arthritis     KNEES  . Endometrial carcinoma [facility])     goes to [facility] in [place] for chemotherapy  . Cervical  carcinoma [facility]) oncologist--  dr Sondra Come for radiation/   [facility] for chemotherapy    endocervical adenocarcinoma w/  Nodal METS--  FIGO Stage IIB  . Heart murmur   . Type 2 diabetes, diet controlled ([place])     diet controlled   . Seizures ([place])     hx of years ago   . History of blood transfusion   . Anemia 2017    3u blood 2017    Past Gynecological History:  SVD x 3  No LMP recorded. Patient is postmenopausal.  Family Hx:  Family History  Problem Relation Age of Onset  . Throat cancer Father   . Cervical cancer Sister     Review of Systems:  Constitutional  Feels well,    ENT Normal appearing ears and nares bilaterally Skin/Breast  No rash, sores, jaundice, itching, dryness Cardiovascular  No chest pain, shortness of breath, or edema  Pulmonary  No cough or wheeze.  Gastro Intestinal  No nausea, vomitting, or diarrhoea. No bright red blood per rectum, no abdominal pain, change in bowel movement, or constipation.  Genito Urinary  No frequency, urgency, dysuria, see HPI Musculo Skeletal  No myalgia, arthralgia, joint swelling or pain  Neurologic  No weakness, numbness, change in gait,  Psychology  No depression, anxiety, insomnia.   Vitals:  Blood pressure 155/72, pulse 59, temperature 98.2 F (36.8 C), temperature source Oral, resp. rate 18, height 5\' 8"  (1.727 m), weight 203 lb 12.8 oz (92.443 kg), SpO2 100 %.  Physical Exam: WD in NAD Neck  Supple NROM, without any enlargements.  Lymph Node Survey No cervical supraclavicular or inguinal adenopathy Cardiovascular  Pulse normal rate, regularity and rhythm. S1 and S2 normal.  Lungs  Clear to auscultation bilateraly, without wheezes/crackles/rhonchi. Good air movement.  Skin  No rash/lesions/breakdown  Psychiatry  Alert and oriented to person, place, and time  Abdomen  Normoactive bowel sounds, abdomen soft, non-tender and overweight without evidence of hernia. Well healed laparoscopic  incisions. Back No CVA tenderness Genito Urinary  Vulva/vagina: Normal external female genitalia.  No lesions. No discharge or bleeding.  Bladder/urethra:  No lesions or masses, well supported bladder  Vagina: Grossly normal with radiation changes and loss of rugation.  Cervix: Cervix flush with upper vagina. Some agglutination and narrowing of upper vagina.  Uterus: Small, mobile, no parametrial involvement or nodularity.  Adnexa: no discrete masses. Rectal  Good tone, no masses no cul de sac nodularity.  Extremities  No bilateral cyanosis, clubbing or edema.   Donaciano Eva, MD   05/08/2016, 1:24 PM

## 2016-05-08 NOTE — Patient Instructions (Signed)
Plan to follow up with Dr. Denman George in three months and Dr. Sondra Come in six months.  Please call for any questions or concerns.  We will reach out to Dr. Donald Pore office and have them give you a call to reschedule your appointment.  Please call for any questions or concerns.

## 2016-05-10 ENCOUNTER — Encounter (HOSPITAL_COMMUNITY): Payer: Medicare Other | Attending: Hematology & Oncology | Admitting: Hematology & Oncology

## 2016-05-10 ENCOUNTER — Encounter (HOSPITAL_COMMUNITY): Payer: Self-pay | Admitting: Hematology & Oncology

## 2016-05-10 VITALS — BP 137/75 | HR 65 | Temp 97.9°F | Resp 20 | Ht 67.5 in | Wt 203.3 lb

## 2016-05-10 DIAGNOSIS — C181 Malignant neoplasm of appendix: Secondary | ICD-10-CM

## 2016-05-10 DIAGNOSIS — Z8541 Personal history of malignant neoplasm of cervix uteri: Secondary | ICD-10-CM

## 2016-05-10 DIAGNOSIS — C539 Malignant neoplasm of cervix uteri, unspecified: Secondary | ICD-10-CM

## 2016-05-10 NOTE — Progress Notes (Signed)
[name]  CONSULT NOTE  Patient Care Team: [name], MD as PCP - General (Internal Medicine)  CHIEF COMPLAINTS/PURPOSE OF CONSULTATION:   Oncology History   1. 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 Positive para-aortic LN on PET s/p XRT 3. 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 Sondra Come   06/22/2015 - 07/26/2015 Chemotherapy Weekly Cisplatin   01/12/2016 PET scan Single hypermetabolic retroperitoneal L periaortic LN worrisome for metastatic disease   04/04/2016 PET scan Interval decrease in FDG uptake with retroperitoneal LN, unchanged appearance of distal appendix which appears thickened and exhibits mild increased uptake   04/19/2016 Surgery Laparoscopic Appendectomy with Dr. Josue Hector. [place]   04/19/2016 Pathology Results Mucinous adenocarcinoma extend into periappend connect tissue and serosal surface, 2.2 cm in size, grade 2, +PNI pT4a,pNX   04/19/2016 Cancer Diagnosis pT4a,pNx Mucinous adenocarcinoma of the appendix    HISTORY OF PRESENTING ILLNESS:  Lori [name] 65 y.o. female is here because of referral by Dr. Rolanda Jay for newly diagnosed mucinous adenocarcinoma of the appendix.   [name] is unaccompanied and ambulates with use of cane. I personally reviewed and discussed pathology and PET scan results with the patient.   She received chemotherapy with Dr. Tressie Stalker and radiation therapy with Dr. Sondra Come for cervical cancer. Radiation therapy completed on 08/26/2015. She only experienced nausea every once in a while with chemotherapy treatment. She did not experience any issues with radiation treatment.   She also follows with Dr. Denman George and Dr. Rolanda Jay. Dr. Rolanda Jay removed her appendix  on 04/19/2016 and found mucinous adenocarcinoma. She did not experience any pain that necessitated the appendectomy. She does not have any follow up appointments scheduled with Dr. Rolanda Jay.   She eats well. She began using a cane after her aneurysm 20 years ago. She has no major complaints at this time.   The patient is worried about having surgery on her colon and ending up with a colostomy bag. Noting her sister has so many problems with her bag where it comes out and often runs out of supplies before the end of the month.  She does not drive as she cannot see well. Her son or grandson drive her. She has reading glasses but does not have normal glasses. She lives in [place] with her 3 sisters and has for the last 16-17 years.   She notes she is aware that she has "another cancer". She spoke with Dr. Denman George yesterday, she has a return visit scheduled for September with her.  The patient is here for further evaluation and discussion of newly diagnosed mucinous adenocarcinoma of the appendix and treatment options.    MEDICAL HISTORY:  Past Medical History  Diagnosis Date  . Depression   . Hypertension   . Hyperlipidemia   . 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 PMB (postmenopausal bleeding)   . History of seizures     post-op craniotomy for aneurysm 1997-- controlled w/ medication since then no seizures  . GERD (gastroesophageal reflux disease)   . Arthritis     KNEES  . Endometrial carcinoma [facility])     goes to [facility] in [place] for chemotherapy  . Cervical carcinoma (  [facility]) oncologist--  dr Sondra Come for radiation/   [facility] for chemotherapy    endocervical adenocarcinoma w/  Nodal METS--  FIGO Stage IIB  . Heart murmur   . Type 2 diabetes, diet controlled ([place])     diet controlled   . Seizures ([place])     hx of years ago   . History of blood transfusion   . Anemia 2017    3u  blood 2017    SURGICAL HISTORY: Past Surgical History  Procedure Laterality Date  . Craniotomy posterior fossa  1997    Repair aneurysm  x2  left side  . Port-a-cath placement  06/  2016  . Tandem ring insertion N/A 07/28/2015    Procedure: TANDEM RING PLACEMENT;  Surgeon: Gery Pray, MD;  Location: [facility];  Service: Urology;  Laterality: N/A;  . Tandem ring insertion N/A 08/05/2015    Procedure: TANDEM RING PLACEMENT ;  Surgeon: Gery Pray, MD;  Location: [facility];  Service: Urology;  Laterality: N/A;  . Tandem ring insertion N/A 08/11/2015    Procedure: TANDEM RING PLACEMENT ;  Surgeon: Gery Pray, MD;  Location[facility];  Service: Urology;  Laterality: N/A;  . Tandem ring insertion N/A 08/17/2015    Procedure: TANDEM RING PLACEMENT ;  Surgeon: Gery Pray, MD;  Location: [facility];  Service: Urology;  Laterality: N/A;  . Tandem ring insertion N/A 08/26/2015    Procedure: TANDEM RING PLACEMENT ;  Surgeon: Gery Pray, MD;  Location: [facility];  Service: Urology;  Laterality: N/A;  . Colonoscopy    . Laparoscopic appendectomy N/A 04/19/2016    Procedure: APPENDECTOMY LAPAROSCOPIC;  Surgeon: Hall Busing, MD;  Location: [facility];  Service: General;  Laterality: N/A;    SOCIAL HISTORY: Social History   Social History  . Marital Status: Single    Spouse Name: N/A  . Number of Children: N/A  . Years of Education: N/A   Occupational History  . Not on file.   Social History Main Topics  . Smoking status: Former Smoker -- 0.25 packs/day for 5 years    Types: Cigarettes    Quit date: 12/05/1995  . Smokeless tobacco: Never Used  . Alcohol Use: No  . Drug Use: No  . Sexual Activity: Not on file   Other Topics Concern  . Not on file   Social History Narrative   Single 2 sons 6 grandchildren Born in [place]. Lives in [place] with her 3 sisters. Ex-smoker. Quit about 20 years ago. She had  two aneurysms. She enjoys playing games on her tablet. Worked until she had an aneurysm, worked in [employer].  FAMILY HISTORY: Family History  Problem Relation Age of Onset  . Throat cancer Father   . Cervical cancer Sister    Mother deceased in her late 74s, possibly from a heart attack. She passed around the time the patient had an aneurysm Father deceased in his 70s of throat cancer. He used to smoke and had quit. 3 sisters and 2 brothers 1 sister had cervical cancer  ALLERGIES:  is allergic to contrast media; dilantin; latex; and adhesive.  MEDICATIONS:  Current Outpatient Prescriptions  Medication Sig Dispense Refill  . amLODipine (NORVASC) 10 MG tablet Take 10 mg by mouth every morning.   5  . aspirin EC 81 MG tablet Take 81 mg by mouth daily.    . benazepril (LOTENSIN) 40 MG tablet Take 40 mg by mouth  every morning.   5  . carbamazepine (TEGRETOL) 100 MG chewable tablet Chew 100 mg by mouth 3 (three) times daily.   5  . cholecalciferol (VITAMIN D) 1000 units tablet Take 1,000 Units by mouth daily. 2 tablets daily    . cloNIDine (CATAPRES) 0.2 MG tablet Take 0.2 mg by mouth 3 (three) times daily.   6  . gabapentin (NEURONTIN) 300 MG capsule Take 300 mg by mouth 2 (two) times daily.   5  . hydrochlorothiazide (HYDRODIURIL) 25 MG tablet Take 25 mg by mouth every morning.   11  . hydrocortisone cream 1 % Apply topically 2 (two) times daily.  1  . ibuprofen (ADVIL,MOTRIN) 400 MG tablet Take 400 mg by mouth every 6 (six) hours as needed (For pain.). Reported on 03/28/2016  0  . loperamide (IMODIUM A-D) 2 MG tablet Take 2 mg by mouth 4 (four) times daily as needed for diarrhea or loose stools. Reported on 03/28/2016    . magnesium oxide (MAG-OX) 400 (241.3 Mg) MG tablet Take 400 mg by mouth daily.     . metoprolol (LOPRESSOR) 50 MG tablet Take 25-50 mg by mouth 2 (two) times daily. Take half a tablet in the morning and half a tablet in the pm  5  . Multiple Vitamins-Minerals  (MULTIVITAMIN WITH MINERALS) tablet Take 1 tablet by mouth daily.    . potassium chloride SA (K-DUR,KLOR-CON) 20 MEQ tablet Take 20 mEq by mouth 2 (two) times daily.   6  . pravastatin (PRAVACHOL) 20 MG tablet Take 20 mg by mouth every evening.   2  . oxyCODONE-acetaminophen (ROXICET) 5-325 MG tablet Take 1 tablet by mouth every 6 (six) hours as needed. (Patient not taking: Reported on 05/10/2016) 20 tablet 0   No current facility-administered medications for this visit.    Review of Systems  Constitutional: Negative.  Negative for fever, chills, weight loss, malaise/fatigue and diaphoresis.  HENT: Negative.  Negative for congestion, ear discharge, ear pain, hearing loss, nosebleeds, sore throat and tinnitus.   Eyes: Negative.  Negative for blurred vision, double vision, photophobia, pain, discharge and redness.  Respiratory: Negative.  Negative for cough, hemoptysis, sputum production, shortness of breath, wheezing and stridor.   Cardiovascular: Negative.  Negative for chest pain, palpitations, orthopnea, claudication, leg swelling and PND.  Gastrointestinal: Negative.  Negative for heartburn, nausea, vomiting, abdominal pain, diarrhea, constipation, blood in stool and melena.  Genitourinary: Negative.  Negative for dysuria, urgency, frequency, hematuria and flank pain.  Musculoskeletal: Negative.  Negative for myalgias, back pain, joint pain, falls and neck pain.  Skin: Negative.  Negative for itching and rash.  Neurological: Positive for focal weakness. Negative for dizziness, tingling, seizures, loss of consciousness, weakness and headaches.       History of aneurysms, uses a cane to assist with ambulation  Endo/Heme/Allergies: Negative.  Negative for environmental allergies and polydipsia. Does not bruise/bleed easily.  Psychiatric/Behavioral: Negative.  Negative for depression, suicidal ideas, hallucinations, memory loss and substance abuse. The patient is not nervous/anxious and does not  have insomnia.   All other systems reviewed and are negative.  14 point ROS was done and is otherwise as detailed above or in HPI   PHYSICAL EXAMINATION: ECOG PERFORMANCE STATUS: 1 - Symptomatic but completely ambulatory  Filed Vitals:   05/10/16 1422  BP: 137/75  Pulse: 65  Temp: 97.9 F (36.6 C)  Resp: 20   Filed Weights   05/10/16 1422  Weight: 203 lb 4.8 oz (92.216 kg)  Physical Exam  Constitutional: She is oriented to person, place, and time and well-developed, well-nourished, and in no distress.  Ambulates with cane  HENT:  Head: Normocephalic and atraumatic.  Nose: Nose normal.  Mouth/Throat: Oropharynx is clear and moist. No oropharyngeal exudate.  Eyes: Conjunctivae and EOM are normal. Pupils are equal, round, and reactive to light. Right eye exhibits no discharge. Left eye exhibits no discharge. No scleral icterus.  Neck: Normal range of motion. Neck supple. No tracheal deviation present. No thyromegaly present.  Cardiovascular: Normal rate, regular rhythm and normal heart sounds.  Exam reveals no gallop and no friction rub.   No murmur heard. Pulmonary/Chest: Effort normal and breath sounds normal. She has no wheezes. She has no rales.  Abdominal: Soft. Bowel sounds are normal. She exhibits no distension and no mass. There is no tenderness. There is no rebound and no guarding.  Musculoskeletal: Normal range of motion. She exhibits no edema.  Lymphadenopathy:    She has no cervical adenopathy.  Neurological: She is alert and oriented to person, place, and time. She has normal reflexes. No cranial nerve deficit. Gait normal. Coordination normal.  Skin: Skin is warm and dry. No rash noted.  Psychiatric: Mood, memory, affect and judgment normal.  Nursing note and vitals reviewed.   LABORATORY DATA:  I have reviewed the data as listed Lab Results  Component Value Date   WBC 4.5 04/14/2016   HGB 10.9* 04/14/2016   HCT 32.6* 04/14/2016   MCV 93.9 04/14/2016     PLT 155 04/14/2016   CMP     Component Value Date/Time   NA 140 04/14/2016 1200   K 3.9 04/14/2016 1200   CL 102 04/14/2016 1200   CO2 30 04/14/2016 1200   GLUCOSE 143* 04/14/2016 1200   BUN 15 04/14/2016 1200   CREATININE 0.96 04/14/2016 1200   CALCIUM 9.2 04/14/2016 1200   PROT 7.5 04/14/2016 1200   ALBUMIN 4.0 04/14/2016 1200   AST 21 04/14/2016 1200   ALT 15 04/14/2016 1200   ALKPHOS 109 04/14/2016 1200   BILITOT 0.5 04/14/2016 1200   GFRNONAA >60 04/14/2016 1200   GFRAA >60 04/14/2016 1200     RADIOGRAPHIC STUDIES: I have personally reviewed the radiological images as listed and agreed with the findings in the report. No results found.  Study Result     CLINICAL DATA: Subsequent treatment strategy for cervical and endometrial carcinoma.  EXAM: NUCLEAR MEDICINE PET SKULL BASE TO THIGH  TECHNIQUE: 9.67 mCi F-18 FDG was injected intravenously. Full-ring PET imaging was performed from the skull base to thigh after the radiotracer. CT data was obtained and used for attenuation correction and anatomic localization.  FASTING BLOOD GLUCOSE: Value: 102 mg/dl  COMPARISON: 01/12/2016  FINDINGS: NECK  No hypermetabolic lymph nodes in the neck.  CHEST  No hypermetabolic mediastinal or hilar nodes. LAD coronary artery calcification noted. Aortic atherosclerosis. No suspicious pulmonary nodules on the CT scan.  ABDOMEN/PELVIS  No abnormal hypermetabolic activity within the liver, pancreas, adrenal glands, or spleen. The periaortic lymph node index periaortic lymph node measures 1 cm and has an SUV max equal to 3.8. Previously this measured 1.2 cm and had an SUV max equal to 6.26. There is a new left inguinal lymph node which measures 8 mm and has an SUV max equal to 3.5. There has been interval decrease in degree of distention associated with the endometrial cavity. Again noted is thickening involving the distal aspect of the appendix  which measures up to 1.2  cm, image 134 of series 4. There is low level FDG uptake identified within this area within SUV max equal to 4.3. Unchanged from previous exam.  SKELETON  No focal hypermetabolic activity to suggest skeletal metastasis. There is a focal area of increased uptake overlying the lateral aspect of the left hip. This appears to be within the subcutaneous soft tissues and has an SUV max equal to 7.58. No aggressive lytic or sclerotic bone lesions identified.  IMPRESSION: 1. Interval decrease in FDG uptake associated with retroperitoneal hypermetabolic lymph node reported on previous exam. Findings compatible with response to therapy. 2. There is a focal area of increased uptake involving the skin overlying the lateral aspect of the left hip. This is favored to represent an inflammatory or infectious process. Cutaneous metastasis is considered less favored. 3. Mild increased uptake within the left angle region, likely reactive. 4. Decrease in degree of distention associated with the endometrial cavity 5. Unchanged appearance of the distal appendix which appears thickened and exhibits mild increased uptake.   Electronically Signed  By: Kerby Moors M.D.  On: 04/04/2016 15:23    PATHOLOGY    ASSESSMENT & PLAN:  Mucinous adenocarcinoma of the appendix pT4a,pNX History of cervical cancer treated with chemotherapy and radiation therapy Appendectomy on 04/19/2016 revealed mucinous adenocarcinoma extending into periappendiceal connective tissue and serosal surface History of aneurysms  The patient is here for further evaluation and discussion of newly diagnosed mucinous adenocarcinoma of the appendix. She has a history of cervical cancer treated with chemotherapy and radiation therapy.  Restaging studies performed in follow-up of her cervical carcinoma in February showed a single hypermetabolic retroperitoneal L periaortic LN felt to be recurrent  cervical carcinoma. She underwent additional XRT.  Repeat PET on May 2nd again mentioned thickening and mild increased uptake in the appendix, she was referred to Dr. Rolanda Jay for appendectomy. Final pathology showed a mucinous adenocarcinoma, a T4 lesion with tumor extension into the periappendiceal connective tissue and PNI.   I discussed the following with the patient: 1. Natural history and prognosis of appendiceal adenocarcinomas is different than adenocarcinomas in other bowel sites.  2. In general, the optimal treatment for most appendiceal adenocarcinomas is a right colectomy, although this is debated. Several retrospective series that are uncontrolled for stage suggest that survival is better with colectomy as compared to simple appendectomy. ( I discussed this with Dr. Denman George and given her prior XRT this may be challenging, will need to discuss with Dr. Rolanda Jay) 3. The role of adjuvant chemotherapy for adenocarcinoma of the appendix is unknown 4. Per NCCN, due to lack of high level data, penal recommends that adenocarcinomas of the small bowel of appendix be treated with systemic chemotherapy according to NCCN guidelines for CRC.   Given all of the above, the best recommendation for her may be to pursue adjuvant FOLFOX. I have discussed this with Dr. Benay Spice as well. We are going to present her at GI tumor board as well. Dr. Denman George notes that additional chemotherapy in the cervical cancer setting is more "consolidative" and she very well may go on and not suffer recurrence without it. Her L peri-aortic LN did not change her initial cervical cancer staging.   I reviewed staging, pathology, and PET scan results with the patient at length.  The patient relies on her family members for transportation and lives in [place].   [phone] is the best number to reach her. I will call with the plan we decide on at tumor board next week.  All questions were answered. The patient knows to call the  clinic with any problems, questions or concerns.  This document serves as a record of services personally performed by Ancil Linsey, MD. It was created on her behalf by Arlyce Harman, a trained medical scribe. The creation of this record is based on the scribe's personal observations and the provider's statements to them. This document has been checked and approved by the attending provider.  I have reviewed the above documentation for accuracy and completeness, and I agree with the above.  This note was electronically signed.    Molli Hazard, MD  05/10/2016 2:47 PM

## 2016-05-10 NOTE — Patient Instructions (Addendum)
[place] at [facility] Discharge Instructions  RECOMMENDATIONS MADE BY THE CONSULTANT AND ANY TEST RESULTS WILL BE SENT TO YOUR REFERRING PHYSICIAN.   Dr. Tawni Carnes Staff is going to call you on Wednesday after the meeting and tell you the recommendations.  There may be a recommendation for chemo OR to go ahead and go in and do the surgery.    Thank you for choosing [place] at [facility] to provide your oncology and hematology care.  To afford each patient quality time with our provider, please arrive at least 15 minutes before your scheduled appointment time.   Beginning January 23rd 2017 lab work for the [facility] will be done in the  Main lab at [facility] on 1st floor. If you have a lab appointment with the [facility] please come in thru the  Main Entrance and check in at the main information desk  You need to re-schedule your appointment should you arrive 10 or more minutes late.  We strive to give you quality time with our providers, and arriving late affects you and other patients whose appointments are after yours.  Also, if you no show three or more times for appointments you may be dismissed from the clinic at the providers discretion.     Again, thank you for choosing [facility].  Our hope is that these requests will decrease the amount of time that you wait before being seen by our physicians.       _____________________________________________________________  Should you have questions after your visit to [facility], please contact our office at [phone] between the hours of 8:30 a.m. and 4:30 p.m.  Voicemails left after 4:30 p.m. will not be returned until the following business day.  For prescription refill requests, have your pharmacy contact our office.         Resources For Cancer Patients and their Caregivers ? American Cancer Society: Can assist with transportation,  wigs, general needs, runs Look Good Feel Better.        ([phone] ? Cancer Care: Provides financial assistance, online support groups, medication/co-pay assistance.  1-800-813-HOPE 346-540-2923) ? [facility] Assists [place] cancer patients and their families through emotional , educational and financial support.  2393319791 ? [place] DSS Where to apply for food stamps, Medicaid and utility assistance. 201-317-9647 ? [employer]: Transportation to medical appointments. 3013238167 ? Social Security Administration: May apply for disability if have a Stage IV cancer. 561-412-3805 570-448-7474 ? [employer], Disability and Transit Services: Assists with nutrition, care and transit needs. [place] Support Programs: @10RELATIVEDAYS @ > Cancer Support Group  2nd Tuesday of the month 1pm-2pm, Journey Room  > Creative Journey  3rd Tuesday of the month 1130am-1pm, Journey Room  > Look Good Feel Better  1st Wednesday of the month 10am-12 noon, Journey Room (Call [facility] to register [phone])

## 2016-05-15 ENCOUNTER — Encounter (HOSPITAL_COMMUNITY): Payer: Self-pay | Admitting: Hematology & Oncology

## 2016-06-05 ENCOUNTER — Telehealth (HOSPITAL_COMMUNITY): Payer: Self-pay | Admitting: *Deleted

## 2016-06-05 NOTE — Telephone Encounter (Signed)
Message left on Lori [name]'s cell phone with appt date and time as well as that the recommendation was for [place] to only observe her.

## 2016-06-21 ENCOUNTER — Encounter (HOSPITAL_COMMUNITY): Payer: Medicare Other | Attending: Hematology & Oncology

## 2016-06-21 VITALS — BP 107/86 | HR 58 | Temp 98.1°F | Resp 18

## 2016-06-21 DIAGNOSIS — Z95828 Presence of other vascular implants and grafts: Secondary | ICD-10-CM | POA: Insufficient documentation

## 2016-06-21 DIAGNOSIS — Z452 Encounter for adjustment and management of vascular access device: Secondary | ICD-10-CM

## 2016-06-21 DIAGNOSIS — C181 Malignant neoplasm of appendix: Secondary | ICD-10-CM

## 2016-06-21 MED ORDER — SODIUM CHLORIDE 0.9% FLUSH
10.0000 mL | Freq: Once | INTRAVENOUS | Status: AC
  Administered 2016-06-21: 10 mL via INTRAVENOUS

## 2016-06-21 MED ORDER — HEPARIN SOD (PORK) LOCK FLUSH 100 UNIT/ML IV SOLN
500.0000 [IU] | Freq: Once | INTRAVENOUS | Status: AC
  Administered 2016-06-21: 500 [IU] via INTRAVENOUS
  Filled 2016-06-21: qty 5

## 2016-06-21 NOTE — Progress Notes (Signed)
Lori [name] presented for Portacath access and flush. Proper placement of portacath confirmed by CXR. Portacath located right chest wall accessed with  H 20 needle. Good blood return present. Portacath flushed with 20ml NS and 500U/5ml Heparin and needle removed intact. Procedure without incident. Patient tolerated procedure well.   

## 2016-06-21 NOTE — Patient Instructions (Signed)
[facility] at [facility] Discharge Instructions  RECOMMENDATIONS MADE BY THE CONSULTANT AND ANY TEST RESULTS WILL BE SENT TO YOUR REFERRING PHYSICIAN.  Port flush today as ordered. Return as scheduled.  Thank you for choosing [facility] at [facility] to provide your oncology and hematology care.  To afford each patient quality time with our provider, please arrive at least 15 minutes before your scheduled appointment time.   Beginning January 23rd 2017 lab work for the Cancer Center will be done in the  Main lab at [facility] on 1st floor. If you have a lab appointment with the Cancer Center please come in thru the  Main Entrance and check in at the main information desk  You need to re-schedule your appointment should you arrive 10 or more minutes late.  We strive to give you quality time with our providers, and arriving late affects you and other patients whose appointments are after yours.  Also, if you no show three or more times for appointments you may be dismissed from the clinic at the providers discretion.     Again, thank you for choosing [facility].  Our hope is that these requests will decrease the amount of time that you wait before being seen by our physicians.       _____________________________________________________________  Should you have questions after your visit to [facility], please contact our office at [phone] between the hours of 8:30 a.m. and 4:30 p.m.  Voicemails left after 4:30 p.m. will not be returned until the following business day.  For prescription refill requests, have your pharmacy contact our office.         Resources For Cancer Patients and their Caregivers ? American Cancer Society: Can assist with transportation, wigs, general needs, runs Look Good Feel Better.        1-888-227-6333 ? Cancer Care: Provides financial assistance, online support groups,  medication/co-pay assistance.  1-800-813-HOPE (4673) ? [facility] Assists [place] cancer patients and their families through emotional , educational and financial support.  336-427-4357 ? [place] DSS Where to apply for food stamps, Medicaid and utility assistance. 336-342-1394 ? RCATS: Transportation to medical appointments. 336-347-2287 ? Social Security Administration: May apply for disability if have a Stage IV cancer. 336-342-7796 1-800-772-1213 ? [place] Aging, Disability and Transit Services: Assists with nutrition, care and transit needs. 336-349-2343  Cancer Center Support Programs: @10RELATIVEDAYS@ > Cancer Support Group  2nd Tuesday of the month 1pm-2pm, Journey Room  > Creative Journey  3rd Tuesday of the month 1130am-1pm, Journey Room  > Look Good Feel Better  1st Wednesday of the month 10am-12 noon, Journey Room (Call American [employer] to register 1-800-395-5775)   

## 2016-07-28 ENCOUNTER — Ambulatory Visit (HOSPITAL_COMMUNITY): Payer: Medicare Other | Admitting: Hematology & Oncology

## 2016-07-28 ENCOUNTER — Encounter (HOSPITAL_COMMUNITY): Payer: Medicare Other | Attending: Hematology & Oncology

## 2016-07-28 VITALS — BP 110/71 | HR 55 | Temp 98.6°F | Resp 18

## 2016-07-28 DIAGNOSIS — Z452 Encounter for adjustment and management of vascular access device: Secondary | ICD-10-CM

## 2016-07-28 DIAGNOSIS — C181 Malignant neoplasm of appendix: Secondary | ICD-10-CM

## 2016-07-28 DIAGNOSIS — Z95828 Presence of other vascular implants and grafts: Secondary | ICD-10-CM | POA: Insufficient documentation

## 2016-07-28 MED ORDER — HEPARIN SOD (PORK) LOCK FLUSH 100 UNIT/ML IV SOLN
500.0000 [IU] | Freq: Once | INTRAVENOUS | Status: AC
  Administered 2016-07-28: 500 [IU] via INTRAVENOUS

## 2016-07-28 MED ORDER — SODIUM CHLORIDE 0.9% FLUSH
10.0000 mL | Freq: Once | INTRAVENOUS | Status: AC
  Administered 2016-07-28: 10 mL via INTRAVENOUS

## 2016-07-28 NOTE — Progress Notes (Signed)
[name] presented for Portacath access and flush.  Proper placement of portacath confirmed by CXR.  Portacath located right chest wall accessed with  H 20 needle.  No blood return but flushed easily without complaints of discomfort or swelling Portacath flushed with 43ml NS and 500U/73ml Heparin and needle removed intact.  Procedure tolerated well and without incident.  Pt discharged self ambulatory using cane in satisfactory condition

## 2016-07-28 NOTE — Patient Instructions (Signed)
[place] at [facility] Discharge Instructions  RECOMMENDATIONS MADE BY THE CONSULTANT AND ANY TEST RESULTS WILL BE SENT TO YOUR REFERRING PHYSICIAN.  Received port flush today. Follow-up as scheduled. Call clinic for any questions or concerns  Thank you for choosing [place] at [facility] to provide your oncology and hematology care.  To afford each patient quality time with our provider, please arrive at least 15 minutes before your scheduled appointment time.   Beginning January 23rd 2017 lab work for the [facility] will be done in the  Main lab at [facility] on 1st floor. If you have a lab appointment with the [facility] please come in thru the  Main Entrance and check in at the main information desk  You need to re-schedule your appointment should you arrive 10 or more minutes late.  We strive to give you quality time with our providers, and arriving late affects you and other patients whose appointments are after yours.  Also, if you no show three or more times for appointments you may be dismissed from the clinic at the providers discretion.     Again, thank you for choosing [facility].  Our hope is that these requests will decrease the amount of time that you wait before being seen by our physicians.       _____________________________________________________________  Should you have questions after your visit to [facility], please contact our office at [phone] between the hours of 8:30 a.m. and 4:30 p.m.  Voicemails left after 4:30 p.m. will not be returned until the following business day.  For prescription refill requests, have your pharmacy contact our office.         Resources For Cancer Patients and their Caregivers ? American Cancer Society: Can assist with transportation, wigs, general needs, runs Look Good Feel Better.        (346)096-7117 ? Cancer Care: Provides financial  assistance, online support groups, medication/co-pay assistance.  1-800-813-HOPE (579) 594-5415) ? [place] Assists [place] cancer patients and their families through emotional , educational and financial support.  (901) 298-7965 ? [place] DSS Where to apply for food stamps, Medicaid and utility assistance. (984)371-3070 ? RCATS: Transportation to medical appointments. 4047740015 ? Social Security Administration: May apply for disability if have a Stage IV cancer. 754-229-0737 507-628-5042 ? [employer], Disability and Transit Services: Assists with nutrition, care and transit needs. [place] Support Programs: @10RELATIVEDAYS @ > Cancer Support Group  2nd Tuesday of the month 1pm-2pm, Journey Room  > Creative Journey  3rd Tuesday of the month 1130am-1pm, Journey Room  > Look Good Feel Better  1st Wednesday of the month 10am-12 noon, Journey Room (Call [place] to register [phone])

## 2016-08-01 ENCOUNTER — Encounter (HOSPITAL_COMMUNITY): Payer: Medicare Other

## 2016-08-01 ENCOUNTER — Encounter (HOSPITAL_BASED_OUTPATIENT_CLINIC_OR_DEPARTMENT_OTHER): Payer: Medicare Other | Admitting: Oncology

## 2016-08-01 ENCOUNTER — Encounter (HOSPITAL_COMMUNITY): Payer: Self-pay | Admitting: Oncology

## 2016-08-01 ENCOUNTER — Ambulatory Visit (HOSPITAL_COMMUNITY): Payer: Medicare Other | Admitting: Hematology & Oncology

## 2016-08-01 VITALS — BP 148/82 | HR 64 | Temp 98.7°F | Resp 18 | Ht 67.5 in | Wt 208.0 lb

## 2016-08-01 DIAGNOSIS — C181 Malignant neoplasm of appendix: Secondary | ICD-10-CM | POA: Diagnosis not present

## 2016-08-01 DIAGNOSIS — C539 Malignant neoplasm of cervix uteri, unspecified: Secondary | ICD-10-CM

## 2016-08-01 DIAGNOSIS — Z95828 Presence of other vascular implants and grafts: Secondary | ICD-10-CM | POA: Diagnosis not present

## 2016-08-01 LAB — COMPREHENSIVE METABOLIC PANEL
ALT: 12 U/L — AB (ref 14–54)
AST: 16 U/L (ref 15–41)
Albumin: 4 g/dL (ref 3.5–5.0)
Alkaline Phosphatase: 117 U/L (ref 38–126)
Anion gap: 6 (ref 5–15)
BUN: 14 mg/dL (ref 6–20)
CHLORIDE: 101 mmol/L (ref 101–111)
CO2: 32 mmol/L (ref 22–32)
CREATININE: 1.05 mg/dL — AB (ref 0.44–1.00)
Calcium: 8.9 mg/dL (ref 8.9–10.3)
GFR calc Af Amer: >60 mL/min (ref >60)
GFR, EST NON AFRICAN AMERICAN: 55 mL/min — AB (ref >60)
Glucose, Bld: 116 mg/dL — ABNORMAL HIGH (ref 65–99)
Potassium: 3.5 mmol/L (ref 3.5–5.1)
SODIUM: 139 mmol/L (ref 135–145)
Total Bilirubin: 0.2 mg/dL — ABNORMAL LOW (ref 0.3–1.2)
Total Protein: 7.8 g/dL (ref 6.5–8.1)

## 2016-08-01 LAB — CBC WITH DIFFERENTIAL/PLATELET
BASOS ABS: 0 10*3/uL (ref 0.0–0.1)
Basophils Relative: 0 %
EOS PCT: 2 %
Eosinophils Absolute: 0.1 10*3/uL (ref 0.0–0.7)
HCT: 34.5 % — ABNORMAL LOW (ref 36.0–46.0)
HEMOGLOBIN: 11.3 g/dL — AB (ref 12.0–15.0)
LYMPHS PCT: 15 %
Lymphs Abs: 0.9 10*3/uL (ref 0.7–4.0)
MCH: 31.2 pg (ref 26.0–34.0)
MCHC: 32.8 g/dL (ref 30.0–36.0)
MCV: 95.3 fL (ref 78.0–100.0)
Monocytes Absolute: 0.4 10*3/uL (ref 0.1–1.0)
Monocytes Relative: 6 %
NEUTROS PCT: 77 %
Neutro Abs: 4.3 10*3/uL (ref 1.7–7.7)
PLATELETS: 198 10*3/uL (ref 150–400)
RBC: 3.62 MIL/uL — AB (ref 3.87–5.11)
RDW: 13.6 % (ref 11.5–15.5)
WBC: 5.7 10*3/uL (ref 4.0–10.5)

## 2016-08-01 NOTE — Patient Instructions (Signed)
[place] at [facility] Discharge Instructions  RECOMMENDATIONS MADE BY THE CONSULTANT AND ANY TEST RESULTS WILL BE SENT TO YOUR REFERRING PHYSICIAN.  We will get you set-up for CT scans in about 1-2 weeks. You will be due for your port flush in about 6 weeks, and then every 6 weeks thereafter. We will have you return in about 3-4 weeks to review your CT scan results. Please call us with any questions or concerns related to your cancer care.  Thank you for choosing [place] at [facility] to provide your oncology and hematology care.  To afford each patient quality time with our provider, please arrive at least 15 minutes before your scheduled appointment time.   Beginning January 23rd 2017 lab work for the [facility] will be done in the  Main lab at [facility] on 1st floor. If you have a lab appointment with the [place] please come in thru the  Main Entrance and check in at the main information desk  You need to re-schedule your appointment should you arrive 10 or more minutes late.  We strive to give you quality time with our providers, and arriving late affects you and other patients whose appointments are after yours.  Also, if you no show three or more times for appointments you may be dismissed from the clinic at the providers discretion.     Again, thank you for choosing [facility].  Our hope is that these requests will decrease the amount of time that you wait before being seen by our physicians.       _____________________________________________________________  Should you have questions after your visit to [facility], please contact our office at [phone] between the hours of 8:30 a.m. and 4:30 p.m.  Voicemails left after 4:30 p.m. will not be returned until the following business day.  For prescription refill requests, have your pharmacy contact our office.         Resources For Cancer  Patients and their Caregivers ? American Cancer Society: Can assist with transportation, wigs, general needs, runs Look Good Feel Better.        203-107-7146 ? Cancer Care: Provides financial assistance, online support groups, medication/co-pay assistance.  1-800-813-HOPE 331-599-0460) ? [place] Assists [place] cancer patients and their families through emotional , educational and financial support.  (727) 284-5964 ? [place] DSS Where to apply for food stamps, Medicaid and utility assistance. 380-677-9692 ? RCATS: Transportation to medical appointments. 864-871-2797 ? Social Security Administration: May apply for disability if have a Stage IV cancer. 678-840-7452 (585)358-0287 ? [employer], Disability and Transit Services: Assists with nutrition, care and transit needs. [place] Support Programs: @10RELATIVEDAYS @ > Cancer Support Group  2nd Tuesday of the month 1pm-2pm, Journey Room  > Creative Journey  3rd Tuesday of the month 1130am-1pm, Journey Room  > Look Good Feel Better  1st Wednesday of the month 10am-12 noon, Journey Room (Call [place] to register [phone])

## 2016-08-01 NOTE — Assessment & Plan Note (Signed)
Stage IIB endocervical adenocarcinoma, S/P primary chemoradiation in 2016. 

## 2016-08-01 NOTE — Assessment & Plan Note (Addendum)
Locally advanced appendiceal mucinous adenocarcinoma (pT4NX), S/P appendectomy on 04/19/2016 by Dr. Rolanda Jay.  Oncology history is up to date.  Labs today: CBC diff, CMET. I personally reviewed and went over laboratory results with the patient.  The results are noted within this dictation.  Her cancer case was discussed at GI tumor board.  Recommendation was ongoing follow-up without systemic chemotherapy at this time.  CT CAP with contrast in ~ 1-2 weeks for surveillance/staging purposes.  Return in ~ 3-4 weeks for follow-up, to review CT scan results, and develop ongoing medical oncology surveillance plan.

## 2016-08-01 NOTE — Progress Notes (Signed)
[facility], MD [address] 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 female returns for followup of locally advanced appendiceal mucinous adenocarcinoma (pT4NX), S/P appendectomy on 04/19/2016 by Dr. Rolanda Jay. AND Stage IIB endocervical adenocarcinoma, S/P primary chemoradiation in 2016.  Oncology History   1. 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 Positive para-aortic LN on PET s/p XRT 3. Locally advance appendiceal mucinous adenocarcinoma pT4Nx s/p appendectomy     Carcinoma of appendix ([place])   04/05/2015 Cancer Diagnosis    Stage IIB adenocarinoma of Cervix, HPV positive      06/21/2015 - 08/11/2015 Radiation Therapy    XRT with concurrent cisplatin, brachytherapy with Dr. Sondra Come      06/22/2015 - 07/26/2015 Chemotherapy    Weekly Cisplatin      01/12/2016 PET scan    Single hypermetabolic retroperitoneal L periaortic LN worrisome for metastatic disease      04/04/2016 PET scan    Interval decrease in FDG uptake with retroperitoneal LN, unchanged appearance of distal appendix which appears thickened and exhibits mild increased uptake      04/19/2016 Surgery    Laparoscopic Appendectomy with Dr. Josue Hector. [place]      04/19/2016 Pathology Results    Mucinous adenocarcinoma extend into periappend connect tissue and serosal surface, 2.2 cm in size, grade 2, +PNI pT4a,pNX      04/19/2016 Cancer  Diagnosis    pT4a,pNx Mucinous adenocarcinoma of the appendix      She denies any complaints.  We reviewed the recommendations provided by GI tumor board regarding her oncology care.  Review of Systems  Constitutional: Negative.  Negative for chills, fever and weight loss.  HENT: Negative.   Eyes: Negative.   Respiratory: Negative.  Negative for cough and hemoptysis.   Cardiovascular: Negative.  Negative for chest pain.  Gastrointestinal: Negative.  Negative for abdominal pain, blood in stool, constipation, diarrhea, melena, nausea and vomiting.  Genitourinary: Negative.   Musculoskeletal: Negative.   Skin: Negative.   Neurological: Negative.  Negative for weakness.  Endo/Heme/Allergies: Negative.   Psychiatric/Behavioral: Negative.     Past Medical History:  Diagnosis Date  . Anemia 2017   3u blood 2017  . Arthritis    KNEES  . Cervical carcinoma [facility]) oncologist--  dr Sondra Come for radiation/   [facility] for chemotherapy   endocervical adenocarcinoma w/  Nodal METS--  FIGO Stage IIB  . Depression   . Endometrial carcinoma [facility])    goes to [facility] in [place] for chemotherapy  . GERD (gastroesophageal reflux disease)   . Heart murmur   . History of blood transfusion   . 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 History of seizures    post-op craniotomy for aneurysm 1997-- controlled w/ medication since then no seizures  . Hyperlipidemia   . Hypertension   . PMB (postmenopausal bleeding)   .  Seizures ([place])    hx of years ago   . Type 2 diabetes, diet controlled ([place])    diet controlled     Past Surgical History:  Procedure Laterality Date  . COLONOSCOPY    . CRANIOTOMY POSTERIOR FOSSA  1997   Repair aneurysm  x2  left side  . LAPAROSCOPIC APPENDECTOMY N/A 04/19/2016   Procedure: APPENDECTOMY LAPAROSCOPIC;  Surgeon: Hall Busing, MD;  Location: [facility];   Service: General;  Laterality: N/A;  . PORT-A-CATH PLACEMENT  06/  2016  . TANDEM RING INSERTION N/A 07/28/2015   Procedure: TANDEM RING PLACEMENT;  Surgeon: Gery Pray, MD;  Location: [facility];  Service: Urology;  Laterality: N/A;  . TANDEM RING INSERTION N/A 08/05/2015   Procedure: TANDEM RING PLACEMENT ;  Surgeon: Gery Pray, MD;  Location: [facility];  Service: Urology;  Laterality: N/A;  . TANDEM RING INSERTION N/A 08/11/2015   Procedure: TANDEM RING PLACEMENT ;  Surgeon: Gery Pray, MD;  Location: [facility];  Service: Urology;  Laterality: N/A;  . TANDEM RING INSERTION N/A 08/17/2015   Procedure: TANDEM RING PLACEMENT ;  Surgeon: Gery Pray, MD;  Location: [facility];  Service: Urology;  Laterality: N/A;  . TANDEM RING INSERTION N/A 08/26/2015   Procedure: TANDEM RING PLACEMENT ;  Surgeon: Gery Pray, MD;  Location: [facility];  Service: Urology;  Laterality: N/A;    Family History  Problem Relation Age of Onset  . Throat cancer Father   . Cervical cancer Sister     Social History   Social History  . Marital status: Single    Spouse name: N/A  . Number of children: N/A  . Years of education: N/A   Social History Main Topics  . Smoking status: Former Smoker    Packs/day: 0.25    Years: 5.00    Types: Cigarettes    Quit date: 12/05/1995  . Smokeless tobacco: Never Used  . Alcohol use No  . Drug use: No  . Sexual activity: Not Asked   Other Topics Concern  . None   Social History Narrative  . None     PHYSICAL EXAMINATION  ECOG PERFORMANCE STATUS: 1 - Symptomatic but completely ambulatory  Vitals:   08/01/16 1356  BP: (!) 148/82  Pulse: 64  Resp: 18  Temp: 98.7 F (37.1 C)    GENERAL:alert, no distress, well nourished, well developed, comfortable, cooperative, smiling and unaccompanied SKIN: skin color, texture, turgor are normal, no rashes or significant lesions HEAD:  Normocephalic, No masses, lesions, tenderness or abnormalities EYES: normal, EOMI, Conjunctiva are pink and non-injected EARS: External ears normal OROPHARYNX:lips, buccal mucosa, and tongue normal and mucous membranes are moist  NECK: supple, trachea midline LYMPH:  no palpable lymphadenopathy BREAST:not examined LUNGS: clear to auscultation  HEART: regular rate & rhythm ABDOMEN:abdomen soft and normal bowel sounds BACK: Back symmetric, no curvature., No CVA tenderness EXTREMITIES:less then 2 second capillary refill, no joint deformities, effusion, or inflammation, no skin discoloration, no clubbing, no cyanosis  NEURO: alert & oriented x 3 with fluent speech, no focal motor/sensory deficits, gait normal   LABORATORY DATA: CBC    Component Value Date/Time   WBC 5.7 08/01/2016 1505   RBC 3.62 (L) 08/01/2016 1505   HGB 11.3 (L) 08/01/2016 1505   HCT 34.5 (L) 08/01/2016 1505   PLT 198 08/01/2016 1505   MCV 95.3 08/01/2016 1505   MCH 31.2 08/01/2016 1505   MCHC 32.8 08/01/2016  1505   RDW 13.6 08/01/2016 1505   LYMPHSABS 0.9 08/01/2016 1505   MONOABS 0.4 08/01/2016 1505   EOSABS 0.1 08/01/2016 1505   BASOSABS 0.0 08/01/2016 1505      Chemistry      Component Value Date/Time   NA 139 08/01/2016 1505   K 3.5 08/01/2016 1505   CL 101 08/01/2016 1505   CO2 32 08/01/2016 1505   BUN 14 08/01/2016 1505   CREATININE 1.05 (H) 08/01/2016 1505      Component Value Date/Time   CALCIUM 8.9 08/01/2016 1505   ALKPHOS 117 08/01/2016 1505   AST 16 08/01/2016 1505   ALT 12 (L) 08/01/2016 1505   BILITOT 0.2 (L) 08/01/2016 1505        PENDING LABS:   RADIOGRAPHIC STUDIES:  No results found.   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 Rolanda Jay.  Oncology history is up to date.  Labs today: CBC diff, CMET. I personally reviewed and went over laboratory  results with the patient.  The results are noted within this dictation.  Her cancer case was discussed at GI tumor board.  Recommendation was ongoing follow-up without systemic chemotherapy at this time.  CT CAP with contrast in ~ 1-2 weeks for surveillance/staging purposes.  Return in ~ 3-4 weeks for follow-up, to review CT scan results, and develop ongoing medical oncology surveillance plan.   Cervical cancer, FIGO stage IIB Stage IIB endocervical adenocarcinoma, S/P primary chemoradiation in 2016.   ORDERS PLACED FOR THIS ENCOUNTER: Orders Placed This Encounter  Procedures  . NM PET Image Restag (PS) Skull Base To Thigh  . CBC with Differential  . Comprehensive metabolic panel    MEDICATIONS PRESCRIBED THIS ENCOUNTER: Meds ordered this encounter  Medications  . magnesium oxide (MAG-OX) 400 MG tablet    Sig: TAKE 1 TABLET BY MOUTH TWICE DAILY - EMERGENCY REFILL FAXED DR    Refill:  0    THERAPY PLAN:  Will set up restaging/surveillance imaging in near future.  All questions were answered. The patient knows to call the clinic with any problems, questions or concerns. We can certainly see the patient much sooner if necessary.  Patient and plan discussed with Dr. Ancil Linsey and she is in agreement with the aforementioned.   This note is electronically signed by: Doy Mince 08/01/2016 9:53 PM

## 2016-08-02 ENCOUNTER — Telehealth (HOSPITAL_COMMUNITY): Payer: Self-pay | Admitting: *Deleted

## 2016-08-02 NOTE — Telephone Encounter (Signed)
-----   Message from [facility], PA-C sent at 08/02/2016 12:50 PM EDT ----- Stable

## 2016-08-02 NOTE — Telephone Encounter (Signed)
Pt aware that labs are stable.  

## 2016-08-09 ENCOUNTER — Ambulatory Visit: Payer: Medicare Other | Admitting: Gynecologic Oncology

## 2016-08-11 ENCOUNTER — Encounter (HOSPITAL_COMMUNITY)
Admission: RE | Admit: 2016-08-11 | Discharge: 2016-08-11 | Disposition: A | Discharge status: Home / Self Care | Payer: Medicare Other | Source: Ambulatory Visit | Attending: Oncology | Admitting: Oncology

## 2016-08-11 DIAGNOSIS — C181 Malignant neoplasm of appendix: Secondary | ICD-10-CM | POA: Diagnosis present

## 2016-08-11 DIAGNOSIS — C539 Malignant neoplasm of cervix uteri, unspecified: Secondary | ICD-10-CM | POA: Diagnosis present

## 2016-08-11 LAB — GLUCOSE, CAPILLARY: Glucose-Capillary: 138 mg/dL — ABNORMAL HIGH (ref 65–99)

## 2016-08-11 MED ORDER — FLUDEOXYGLUCOSE F - 18 (FDG) INJECTION
10.4100 | Freq: Once | INTRAVENOUS | Status: AC | PRN
  Administered 2016-08-11: 10.41 via INTRAVENOUS

## 2016-08-16 ENCOUNTER — Ambulatory Visit (HOSPITAL_COMMUNITY): Payer: Medicare Other

## 2016-08-16 ENCOUNTER — Telehealth (HOSPITAL_COMMUNITY): Payer: Self-pay | Admitting: *Deleted

## 2016-08-16 NOTE — Telephone Encounter (Signed)
-----   Message from Baird [facility], PA-C sent at 08/15/2016  5:50 PM EDT ----- Stable.  Reviewed with Dr. Whitney Muse

## 2016-08-16 NOTE — Telephone Encounter (Signed)
Pt aware that PET is stable.

## 2016-09-04 NOTE — Progress Notes (Signed)
[name]  Progress Note  Patient Care Team: [name], MD as PCP - General (Internal Medicine)  CHIEF COMPLAINTS/PURPOSE OF CONSULTATION:   Oncology History   1. 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 Positive para-aortic LN on PET s/p XRT 3. 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 Sondra Come      06/22/2015 - 07/26/2015 Chemotherapy    Weekly Cisplatin      01/12/2016 PET scan    Single hypermetabolic retroperitoneal L periaortic LN worrisome for metastatic disease      04/04/2016 PET scan    Interval decrease in FDG uptake with retroperitoneal LN, unchanged appearance of distal appendix which appears thickened and exhibits mild increased uptake      04/19/2016 Surgery    Laparoscopic Appendectomy with Dr. Josue Hector. [place]      04/19/2016 Pathology Results    Mucinous adenocarcinoma extend into periappend connect tissue and serosal surface, 2.2 cm in size, grade 2, +PNI pT4a,pNX      04/19/2016 Cancer Diagnosis    pT4a,pNx Mucinous adenocarcinoma of the appendix      08/11/2016 PET scan    No evidence of hypermetabolic appendiceal carcinoma recurrence or metastasis in the abdomen or pelvis. 2. Metabolic activity at the level the cervix difficult to assess as adjacent to bladder activity. 3. No hypermetabolic lymph nodes in the abdomen or pelvis. No distant disease.       HISTORY OF PRESENTING ILLNESS:  Lori [name] 65 y.o. female returns for follow-up of resected mucinous adenocarcinoma of the appendix and cervical carcinoma.   Lori [name] is unaccompanied and ambulates with cane.   She denies abdominal pain or bowel issues. She has been "eating everything I  see". She notes that she feels well, she has no major complaints. She continues to be active with family.  She has not had a mammogram this year.  She follows regularly with her PCP, Dr. Manuella Ghazi.  She has made a good recovery from all treatment. She presents to review recent imaging.   MEDICAL HISTORY:  Past Medical History:  Diagnosis Date  . Anemia 2017   3u blood 2017  . Arthritis    KNEES  . Cervical carcinoma [facility]) oncologist--  dr Sondra Come for radiation/   [facility] for chemotherapy   endocervical adenocarcinoma w/  Nodal METS--  FIGO Stage IIB  . Depression   . Endometrial carcinoma [facility])    goes to [facility] in [place] for chemotherapy  . GERD (gastroesophageal reflux disease)   . Heart murmur   . History of blood transfusion   . 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 History of seizures    post-op craniotomy for aneurysm 1997-- controlled w/ medication since then no seizures  . Hyperlipidemia   . Hypertension   . PMB (postmenopausal bleeding)   . Seizures ([place])    hx of years ago   . Type 2 diabetes, diet controlled ([place])    diet controlled     SURGICAL HISTORY: Past Surgical History:  Procedure Laterality Date  . COLONOSCOPY    . [place]  Repair aneurysm  x2  left side  . LAPAROSCOPIC APPENDECTOMY N/A 04/19/2016   Procedure: APPENDECTOMY LAPAROSCOPIC;  Surgeon: Hall Busing, MD;  Location: [facility];  Service: General;  Laterality: N/A;  . PORT-A-CATH PLACEMENT  06/  2016  . TANDEM RING INSERTION N/A 07/28/2015   Procedure: TANDEM RING PLACEMENT;  Surgeon: Gery Pray, MD;  Location: [facility];  Service: Urology;  Laterality: N/A;  . TANDEM RING INSERTION N/A 08/05/2015   Procedure: TANDEM RING PLACEMENT ;  Surgeon: Gery Pray, MD;  Location: [facility];  Service: Urology;  Laterality: N/A;  . TANDEM  RING INSERTION N/A 08/11/2015   Procedure: TANDEM RING PLACEMENT ;  Surgeon: Gery Pray, MD;  Location: [facility];  Service: Urology;  Laterality: N/A;  . TANDEM RING INSERTION N/A 08/17/2015   Procedure: TANDEM RING PLACEMENT ;  Surgeon: Gery Pray, MD;  Location: [facility];  Service: Urology;  Laterality: N/A;  . TANDEM RING INSERTION N/A 08/26/2015   Procedure: TANDEM RING PLACEMENT ;  Surgeon: Gery Pray, MD;  Location: [facility];  Service: Urology;  Laterality: N/A;    SOCIAL HISTORY: Social History   Social History  . Marital status: Single    Spouse name: N/A  . Number of children: N/A  . Years of education: N/A   Occupational History  . Not on file.   Social History Main Topics  . Smoking status: Former Smoker    Packs/day: 0.25    Years: 5.00    Types: Cigarettes    Quit date: 12/05/1995  . Smokeless tobacco: Never Used  . Alcohol use No  . Drug use: No  . Sexual activity: Not on file   Other Topics Concern  . Not on file   Social History Narrative  . No narrative on file   Single 2 sons 6 grandchildren Born in [place]. Lives in [place] with her 3 sisters. Ex-smoker. Quit about 20 years ago. She had two aneurysms. She enjoys playing games on her tablet. Worked until she had an aneurysm, worked in [employer].  FAMILY HISTORY: Family History  Problem Relation Age of Onset  . Throat cancer Father   . Cervical cancer Sister    Mother deceased in her late 37s, possibly from a heart attack. She passed around the time the patient had an aneurysm Father deceased in his 59s of throat cancer. He used to smoke and had quit. 3 sisters and 2 brothers 1 sister had cervical cancer  ALLERGIES:  is allergic to contrast media [iodinated diagnostic agents]; dilantin [phenytoin sodium extended]; latex; and adhesive [tape].  MEDICATIONS:  Current Outpatient Prescriptions  Medication Sig Dispense Refill  . amLODipine (NORVASC)  10 MG tablet Take 10 mg by mouth every morning.   5  . aspirin EC 81 MG tablet Take 81 mg by mouth daily.    . benazepril (LOTENSIN) 40 MG tablet Take 40 mg by mouth every morning.   5  . carbamazepine (TEGRETOL) 100 MG chewable tablet Chew 100 mg by mouth 3 (three) times daily.   5  . cholecalciferol (VITAMIN D) 1000 units tablet Take 1,000 Units by mouth daily. 2 tablets daily    . cloNIDine (CATAPRES) 0.2 MG tablet Take 0.2 mg by mouth 3 (three) times daily.   6  . gabapentin (NEURONTIN) 300 MG capsule Take 300 mg by mouth 2 (two) times daily.   5  . hydrochlorothiazide (HYDRODIURIL) 25 MG tablet Take 25 mg by mouth  every morning.   11  . hydrocortisone cream 1 % Apply topically 2 (two) times daily.  1  . ibuprofen (ADVIL,MOTRIN) 400 MG tablet Take 400 mg by mouth every 6 (six) hours as needed (For pain.). Reported on 03/28/2016  0  . loperamide (IMODIUM A-D) 2 MG tablet Take 2 mg by mouth 4 (four) times daily as needed for diarrhea or loose stools. Reported on 03/28/2016    . magnesium oxide (MAG-OX) 400 (241.3 Mg) MG tablet Take 400 mg by mouth daily.     . magnesium oxide (MAG-OX) 400 MG tablet TAKE 1 TABLET BY MOUTH TWICE DAILY - EMERGENCY REFILL FAXED DR  0  . metoprolol (LOPRESSOR) 50 MG tablet Take 25-50 mg by mouth 2 (two) times daily. Take half a tablet in the morning and half a tablet in the pm  5  . Multiple Vitamins-Minerals (MULTIVITAMIN WITH MINERALS) tablet Take 1 tablet by mouth daily.    Marland Kitchen oxyCODONE-acetaminophen (ROXICET) 5-325 MG tablet Take 1 tablet by mouth every 6 (six) hours as needed. 20 tablet 0  . potassium chloride SA (K-DUR,KLOR-CON) 20 MEQ tablet Take 20 mEq by mouth 2 (two) times daily.   6  . pravastatin (PRAVACHOL) 20 MG tablet Take 20 mg by mouth every evening.   2   No current facility-administered medications for this visit.     Review of Systems  Constitutional: Negative.  Negative for chills, diaphoresis, fever, malaise/fatigue and weight loss.  HENT:  Negative.  Negative for congestion, ear discharge, ear pain, hearing loss, nosebleeds, sore throat and tinnitus.   Eyes: Negative.  Negative for blurred vision, double vision, photophobia, pain, discharge and redness.  Respiratory: Negative.  Negative for cough, hemoptysis, sputum production, shortness of breath, wheezing and stridor.   Cardiovascular: Negative.  Negative for chest pain, palpitations, orthopnea, claudication, leg swelling and PND.  Gastrointestinal: Negative.  Negative for abdominal pain, blood in stool, constipation, diarrhea, heartburn, melena, nausea and vomiting.  Genitourinary: Negative.  Negative for dysuria, flank pain, frequency, hematuria and urgency.  Musculoskeletal: Negative.  Negative for back pain, falls, joint pain, myalgias and neck pain.  Skin: Negative.  Negative for itching and rash.  Neurological: Positive for focal weakness. Negative for dizziness, tingling, seizures, loss of consciousness, weakness and headaches.       History of aneurysms, uses a cane to assist with ambulation  Endo/Heme/Allergies: Negative.  Negative for environmental allergies and polydipsia. Does not bruise/bleed easily.  Psychiatric/Behavioral: Negative.  Negative for depression, hallucinations, memory loss, substance abuse and suicidal ideas. The patient is not nervous/anxious and does not have insomnia.   All other systems reviewed and are negative.  14 point ROS was done and is otherwise as detailed above or in HPI   PHYSICAL EXAMINATION: ECOG PERFORMANCE STATUS: 1 - Symptomatic but completely ambulatory  Vitals:   09/06/16 1032  BP: 133/71  Pulse: 60  Resp: 16  Temp: 98.6 F (37 C)   Filed Weights   09/06/16 1032  Weight: 212 lb 14.4 oz (96.6 kg)    Physical Exam  Constitutional: She is oriented to person, place, and time and well-developed, well-nourished, and in no distress.  Ambulates with cane  HENT:  Head: Normocephalic and atraumatic.  Nose: Nose normal.    Mouth/Throat: Oropharynx is clear and moist. No oropharyngeal exudate.  Eyes: Conjunctivae and EOM are normal. Pupils are equal, round, and reactive to light. Right eye exhibits no discharge. Left eye exhibits no discharge. No scleral icterus.  Neck: Normal range of  motion. Neck supple. No tracheal deviation present. No thyromegaly present.  Cardiovascular: Normal rate, regular rhythm and normal heart sounds.  Exam reveals no gallop and no friction rub.   No murmur heard. Pulmonary/Chest: Effort normal and breath sounds normal. She has no wheezes. She has no rales.  Abdominal: Soft. Bowel sounds are normal. She exhibits no distension and no mass. There is no tenderness. There is no rebound and no guarding.  Musculoskeletal: Normal range of motion. She exhibits no edema.  Lymphadenopathy:    She has no cervical adenopathy.  Neurological: She is alert and oriented to person, place, and time. She has normal reflexes. No cranial nerve deficit. Gait normal. Coordination normal.  Skin: Skin is warm and dry. No rash noted.  Psychiatric: Mood, memory, affect and judgment normal.  Nursing note and vitals reviewed.   LABORATORY DATA:  I have reviewed the data as listed Lab Results  Component Value Date   WBC 5.7 08/01/2016   HGB 11.3 (L) 08/01/2016   HCT 34.5 (L) 08/01/2016   MCV 95.3 08/01/2016   PLT 198 08/01/2016   CMP     Component Value Date/Time   NA 139 08/01/2016 1505   K 3.5 08/01/2016 1505   CL 101 08/01/2016 1505   CO2 32 08/01/2016 1505   GLUCOSE 116 (H) 08/01/2016 1505   BUN 14 08/01/2016 1505   CREATININE 1.05 (H) 08/01/2016 1505   CALCIUM 8.9 08/01/2016 1505   PROT 7.8 08/01/2016 1505   ALBUMIN 4.0 08/01/2016 1505   AST 16 08/01/2016 1505   ALT 12 (L) 08/01/2016 1505   ALKPHOS 117 08/01/2016 1505   BILITOT 0.2 (L) 08/01/2016 1505   GFRNONAA 55 (L) 08/01/2016 1505   GFRAA >60 08/01/2016 1505     RADIOGRAPHIC STUDIES: I have personally reviewed the radiological  images as listed and agreed with the findings in the report. No results found. Study Result   CLINICAL DATA:  Subsequent treatment strategy for the cyst adenocarcinoma of the appendix. Additional history of cervical cancer.  EXAM: NUCLEAR MEDICINE PET SKULL BASE TO THIGH  TECHNIQUE: 10.4 mCi F-18 FDG was injected intravenously. Full-ring PET imaging was performed from the skull base to thigh after the radiotracer. CT data was obtained and used for attenuation correction and anatomic localization.  FASTING BLOOD GLUCOSE:  Value: 138 mg/dl  COMPARISON:  PET-CT 04/04/2016  FINDINGS: NECK  Not hypermetabolic lymph nodes in the neck. Encephalomalacia in the LEFT cerebellum adjacent to a craniotomy site  CHEST  No hypermetabolic mediastinal or hilar nodes. No suspicious pulmonary nodules on the CT scan.  ABDOMEN/PELVIS  Patient status post appendectomy. No hypermetabolic nodularity adjacent to the cecum. Ovaries are normal. There is metabolic activity at the level of the cervix which is difficult to define as this is adjacent to the bladder. No hypermetabolic pelvic lymph nodes.  SKELETON  No focal hypermetabolic activity to suggest skeletal metastasis.  IMPRESSION: 1. No evidence of hypermetabolic appendiceal carcinoma recurrence or metastasis in the abdomen or pelvis. 2. Metabolic activity at the level the cervix difficult to assess as adjacent to bladder activity. 3. No hypermetabolic lymph nodes in the abdomen or pelvis. No distant disease.   Electronically Signed   By: Suzy Bouchard M.D.   On: 08/11/2016 15:37     PATHOLOGY    ASSESSMENT & PLAN:  Mucinous adenocarcinoma of the appendix pT4a,pNX History of cervical cancer treated with chemotherapy and radiation therapy Appendectomy on 04/19/2016 revealed mucinous adenocarcinoma extending into periappendiceal connective tissue and serosal  surface History of aneurysms   Cervical  cancer, FIGO stage IIB Stage IIB endocervical adenocarcinoma, S/P primary chemoradiation in 2016. The patient completed 45 gray to the pelvis and a 9 gray sidewall boost at the [facility] in [place] prior to brachytherapy treatments. She also receive radiosensitizing chemotherapy at that facility.  She underwent PET/CT on 05/31/16 which showed intense cervical activity, a clinically positive left external iliac node and intense activity at the distal appendix with a thickened appendiceal wall (1mm) in this region. She received additional radiation to a para-aortic lymph node that was increased PET positive after completing primary chemoradiation in September 2016.  Currently PET/CT is without obvious recurrence. Patient has a follow-up appointment with Dr. Denman George 10/20/2016.   Carcinoma of appendix ([place]) Locally advanced appendiceal mucinous adenocarcinoma (pT4NX), S/P appendectomy on 04/19/2016 by Dr. Rolanda Jay.  Oncology history is up to date.  Labs from 8/29 reviewed: CBC diff, CMET. I personally reviewed and went over laboratory results with the patient.  The results are noted within this dictation.  As noted PET/CT is without obvious recurrence. Clinically she is doing well. RTC in 3 months. Keep follow-up with gyn onc and rad onc as scheduled.    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 Currently no activity on PET/CT.   All questions were answered. The patient knows to call the clinic with any problems, questions or concerns.  This document serves as a record of services personally performed by Ancil Linsey, MD. It was created on her behalf by Arlyce Harman, a trained medical scribe. The creation of this record is based on the scribe's personal observations and the provider's statements to them. This document has been checked and approved by the attending provider.  I have reviewed the above documentation for  accuracy and completeness, and I agree with the above.  This note was electronically signed.    Molli Hazard, MD  09/07/2016 5:40 PM

## 2016-09-06 ENCOUNTER — Other Ambulatory Visit (HOSPITAL_COMMUNITY): Payer: Self-pay | Admitting: Hematology & Oncology

## 2016-09-06 ENCOUNTER — Encounter (HOSPITAL_COMMUNITY): Payer: Medicare Other | Attending: Hematology & Oncology | Admitting: Hematology & Oncology

## 2016-09-06 ENCOUNTER — Encounter (HOSPITAL_COMMUNITY): Payer: Self-pay | Admitting: Hematology & Oncology

## 2016-09-06 VITALS — BP 133/71 | HR 60 | Temp 98.6°F | Resp 16 | Wt 212.9 lb

## 2016-09-06 DIAGNOSIS — C181 Malignant neoplasm of appendix: Secondary | ICD-10-CM | POA: Diagnosis not present

## 2016-09-06 DIAGNOSIS — C539 Malignant neoplasm of cervix uteri, unspecified: Secondary | ICD-10-CM

## 2016-09-06 DIAGNOSIS — Z1239 Encounter for other screening for malignant neoplasm of breast: Secondary | ICD-10-CM

## 2016-09-06 DIAGNOSIS — C786 Secondary malignant neoplasm of retroperitoneum and peritoneum: Secondary | ICD-10-CM | POA: Diagnosis not present

## 2016-09-06 DIAGNOSIS — C772 Secondary and unspecified malignant neoplasm of intra-abdominal lymph nodes: Secondary | ICD-10-CM

## 2016-09-06 DIAGNOSIS — Z8541 Personal history of malignant neoplasm of cervix uteri: Secondary | ICD-10-CM

## 2016-09-06 DIAGNOSIS — Z95828 Presence of other vascular implants and grafts: Secondary | ICD-10-CM | POA: Insufficient documentation

## 2016-09-06 DIAGNOSIS — Z1231 Encounter for screening mammogram for malignant neoplasm of breast: Secondary | ICD-10-CM

## 2016-09-06 NOTE — Patient Instructions (Addendum)
[place] at [facility] Discharge Instructions  RECOMMENDATIONS MADE BY THE CONSULTANT AND ANY TEST RESULTS WILL BE SENT TO YOUR REFERRING PHYSICIAN.  You saw Dr. Whitney Muse today. Follow up in March after PET scan with labwork same day as follow up.  Thank you for choosing [place] at [facility] to provide your oncology and hematology care.  To afford each patient quality time with our provider, please arrive at least 15 minutes before your scheduled appointment time.   Beginning January 23rd 2017 lab work for the [facility] will be done in the  Main lab at [facility] on 1st floor. If you have a lab appointment with the [place] please come in thru the  Main Entrance and check in at the main information desk  You need to re-schedule your appointment should you arrive 10 or more minutes late.  We strive to give you quality time with our providers, and arriving late affects you and other patients whose appointments are after yours.  Also, if you no show three or more times for appointments you may be dismissed from the clinic at the providers discretion.     Again, thank you for choosing [facility].  Our hope is that these requests will decrease the amount of time that you wait before being seen by our physicians.       _____________________________________________________________  Should you have questions after your visit to [facility], please contact our office at [phone] between the hours of 8:30 a.m. and 4:30 p.m.  Voicemails left after 4:30 p.m. will not be returned until the following business day.  For prescription refill requests, have your pharmacy contact our office.         Resources For Cancer Patients and their Caregivers ? American Cancer Society: Can assist with transportation, wigs, general needs, runs Look Good Feel Better.        484-497-4506 ? Cancer Care: Provides financial  assistance, online support groups, medication/co-pay assistance.  1-800-813-HOPE (817) 171-9827) ? [facility] Assists [place] cancer patients and their families through emotional , educational and financial support.  7256511566 ? [place] DSS Where to apply for food stamps, Medicaid and utility assistance. 276-420-2233 ? RCATS: Transportation to medical appointments. 681-562-0402 ? Social Security Administration: May apply for disability if have a Stage IV cancer. 409-398-1993 385-461-2082 ? [employer], Disability and Transit Services: Assists with nutrition, care and transit needs. [place] Support Programs: @10RELATIVEDAYS @ > Cancer Support Group  2nd Tuesday of the month 1pm-2pm, Journey Room  > Creative Journey  3rd Tuesday of the month 1130am-1pm, Journey Room  > Look Good Feel Better  1st Wednesday of the month 10am-12 noon, Journey Room (Call [place] to register [phone])

## 2016-09-07 ENCOUNTER — Encounter (HOSPITAL_COMMUNITY): Payer: Self-pay | Admitting: Hematology & Oncology

## 2016-09-07 NOTE — Assessment & Plan Note (Addendum)
Locally advanced appendiceal mucinous adenocarcinoma (pT4NX), S/P appendectomy on 04/19/2016 by Dr. Rolanda Jay.  Oncology history is up to date.  Labs from 8/29 reviewed: CBC diff, CMET. I personally reviewed and went over laboratory results with the patient.  The results are noted within this dictation.  As noted PET/CT is without obvious recurrence. Clinically she is doing well. RTC in 3 months. Keep follow-up with gyn onc and rad onc as scheduled.

## 2016-09-07 NOTE — Assessment & Plan Note (Addendum)
Stage IIB endocervical adenocarcinoma, S/P primary chemoradiation in 2016. The patient completed 45 gray to the pelvis and a 9 gray sidewall boost at the [facility] in [place] prior to brachytherapy treatments. She also receive radiosensitizing chemotherapy at that facility.  She underwent PET/CT on 05/31/16 which showed intense cervical activity, a clinically positive left external iliac node and intense activity at the distal appendix with a thickened appendiceal wall (84mm) in this region. She received additional radiation to a para-aortic lymph node that was increased PET positive after completing primary chemoradiation in September 2016.  Currently PET/CT is without obvious recurrence. Patient has a follow-up appointment with Dr. Denman George 10/20/2016.

## 2016-09-07 NOTE — Assessment & Plan Note (Signed)
She received XRT to left periaortic station LN. Currently no activity on PET/CT.

## 2016-09-20 ENCOUNTER — Ambulatory Visit (HOSPITAL_COMMUNITY)
Admission: RE | Admit: 2016-09-20 | Discharge: 2016-09-20 | Disposition: A | Discharge status: Home / Self Care | Payer: Medicare Other | Source: Ambulatory Visit | Attending: Hematology & Oncology | Admitting: Hematology & Oncology

## 2016-09-20 ENCOUNTER — Ambulatory Visit (HOSPITAL_COMMUNITY): Payer: Medicare Other

## 2016-09-20 DIAGNOSIS — Z1231 Encounter for screening mammogram for malignant neoplasm of breast: Secondary | ICD-10-CM | POA: Diagnosis present

## 2016-09-22 ENCOUNTER — Encounter (HOSPITAL_BASED_OUTPATIENT_CLINIC_OR_DEPARTMENT_OTHER): Payer: Medicare Other

## 2016-09-22 VITALS — BP 135/78 | HR 57 | Temp 97.9°F | Resp 18

## 2016-09-22 DIAGNOSIS — Z452 Encounter for adjustment and management of vascular access device: Secondary | ICD-10-CM

## 2016-09-22 DIAGNOSIS — C181 Malignant neoplasm of appendix: Secondary | ICD-10-CM

## 2016-09-22 DIAGNOSIS — Z95828 Presence of other vascular implants and grafts: Secondary | ICD-10-CM

## 2016-09-22 MED ORDER — HEPARIN SOD (PORK) LOCK FLUSH 100 UNIT/ML IV SOLN
500.0000 [IU] | Freq: Once | INTRAVENOUS | Status: AC
  Administered 2016-09-22: 500 [IU] via INTRAVENOUS

## 2016-09-22 MED ORDER — SODIUM CHLORIDE 0.9% FLUSH
10.0000 mL | INTRAVENOUS | Status: DC | PRN
  Administered 2016-09-22: 10 mL via INTRAVENOUS
  Filled 2016-09-22: qty 10

## 2016-09-22 MED ORDER — HEPARIN SOD (PORK) LOCK FLUSH 100 UNIT/ML IV SOLN
INTRAVENOUS | Status: AC
  Filled 2016-09-22: qty 5

## 2016-09-22 NOTE — Progress Notes (Signed)
Lori [name] tolerated port flush well without complaints or incident. Port flushed with 10 ml NS and 5 ml Heparin easily per protocol. Pt discharged self ambulatory using cane in satisfactory condition

## 2016-09-22 NOTE — Patient Instructions (Signed)
[place] at [facility] Discharge Instructions  RECOMMENDATIONS MADE BY THE CONSULTANT AND ANY TEST RESULTS WILL BE SENT TO YOUR REFERRING PHYSICIAN.  Port flushed today per protocol. Follow-up as scheduled. Call clinic for any questions or concerns  Thank you for choosing [place] at [facility] to provide your oncology and hematology care.  To afford each patient quality time with our provider, please arrive at least 15 minutes before your scheduled appointment time.   Beginning January 23rd 2017 lab work for the [facility] will be done in the  Main lab at [facility] on 1st floor. If you have a lab appointment with the [facility] please come in thru the  Main Entrance and check in at the main information desk  You need to re-schedule your appointment should you arrive 10 or more minutes late.  We strive to give you quality time with our providers, and arriving late affects you and other patients whose appointments are after yours.  Also, if you no show three or more times for appointments you may be dismissed from the clinic at the providers discretion.     Again, thank you for choosing [facility].  Our hope is that these requests will decrease the amount of time that you wait before being seen by our physicians.       _____________________________________________________________  Should you have questions after your visit to [facility], please contact our office at [phone] between the hours of 8:30 a.m. and 4:30 p.m.  Voicemails left after 4:30 p.m. will not be returned until the following business day.  For prescription refill requests, have your pharmacy contact our office.         Resources For Cancer Patients and their Caregivers ? American Cancer Society: Can assist with transportation, wigs, general needs, runs Look Good Feel Better.        (615)791-2136 ? Cancer Care: Provides financial  assistance, online support groups, medication/co-pay assistance.  1-800-813-HOPE 234-601-4416) ? [place] Assists [place] cancer patients and their families through emotional , educational and financial support.  716-108-5960 ? [place] DSS Where to apply for food stamps, Medicaid and utility assistance. 320 624 3061 ? RCATS: Transportation to medical appointments. 878-223-6449 ? Social Security Administration: May apply for disability if have a Stage IV cancer. 838-749-8402 639-078-6323 ? [employer], Disability and Transit Services: Assists with nutrition, care and transit needs. [place] Support Programs: @10RELATIVEDAYS @ > Cancer Support Group  2nd Tuesday of the month 1pm-2pm, Journey Room  > Creative Journey  3rd Tuesday of the month 1130am-1pm, Journey Room  > Look Good Feel Better  1st Wednesday of the month 10am-12 noon, Journey Room (Call [place] to register [phone])

## 2016-10-18 ENCOUNTER — Encounter: Payer: Self-pay | Admitting: Gynecologic Oncology

## 2016-10-18 ENCOUNTER — Ambulatory Visit: Payer: Medicare Other | Attending: Gynecologic Oncology | Admitting: Gynecologic Oncology

## 2016-10-18 VITALS — BP 121/69 | HR 58 | Temp 98.4°F | Resp 18 | Ht 67.5 in | Wt 210.4 lb

## 2016-10-18 DIAGNOSIS — C539 Malignant neoplasm of cervix uteri, unspecified: Secondary | ICD-10-CM | POA: Insufficient documentation

## 2016-10-18 DIAGNOSIS — Z87891 Personal history of nicotine dependence: Secondary | ICD-10-CM | POA: Insufficient documentation

## 2016-10-18 DIAGNOSIS — K219 Gastro-esophageal reflux disease without esophagitis: Secondary | ICD-10-CM | POA: Diagnosis not present

## 2016-10-18 DIAGNOSIS — Z800 Family history of malignant neoplasm of digestive organs: Secondary | ICD-10-CM | POA: Insufficient documentation

## 2016-10-18 DIAGNOSIS — I10 Essential (primary) hypertension: Secondary | ICD-10-CM | POA: Diagnosis not present

## 2016-10-18 DIAGNOSIS — Z9889 Other specified postprocedural states: Secondary | ICD-10-CM | POA: Insufficient documentation

## 2016-10-18 DIAGNOSIS — Z91041 Radiographic dye allergy status: Secondary | ICD-10-CM | POA: Diagnosis not present

## 2016-10-18 DIAGNOSIS — Z888 Allergy status to other drugs, medicaments and biological substances status: Secondary | ICD-10-CM | POA: Insufficient documentation

## 2016-10-18 DIAGNOSIS — Z9109 Other allergy status, other than to drugs and biological substances: Secondary | ICD-10-CM | POA: Diagnosis not present

## 2016-10-18 DIAGNOSIS — Z7982 Long term (current) use of aspirin: Secondary | ICD-10-CM | POA: Insufficient documentation

## 2016-10-18 DIAGNOSIS — E119 Type 2 diabetes mellitus without complications: Secondary | ICD-10-CM | POA: Insufficient documentation

## 2016-10-18 DIAGNOSIS — Z91040 Latex allergy status: Secondary | ICD-10-CM | POA: Insufficient documentation

## 2016-10-18 DIAGNOSIS — Z8049 Family history of malignant neoplasm of other genital organs: Secondary | ICD-10-CM | POA: Diagnosis not present

## 2016-10-18 DIAGNOSIS — M170 Bilateral primary osteoarthritis of knee: Secondary | ICD-10-CM | POA: Insufficient documentation

## 2016-10-18 DIAGNOSIS — N950 Postmenopausal bleeding: Secondary | ICD-10-CM | POA: Insufficient documentation

## 2016-10-18 DIAGNOSIS — Z8509 Personal history of malignant neoplasm of other digestive organs: Secondary | ICD-10-CM | POA: Diagnosis not present

## 2016-10-18 DIAGNOSIS — Z923 Personal history of irradiation: Secondary | ICD-10-CM | POA: Insufficient documentation

## 2016-10-18 DIAGNOSIS — Z8541 Personal history of malignant neoplasm of cervix uteri: Secondary | ICD-10-CM

## 2016-10-18 DIAGNOSIS — E785 Hyperlipidemia, unspecified: Secondary | ICD-10-CM | POA: Diagnosis not present

## 2016-10-18 NOTE — Progress Notes (Signed)
Follow-up Note: Gyn-Onc   Lori [name] 65 y.o. female  CC:  Chief Complaint  Patient presents with  . Cervical cancer, FIGO stage IIB ( HCC )    Follow up  New diagnosis of appendiceal cancer.  Assessment/Plan:  [name]  is a 65 y.o.  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   Complete clinical response on PET of 08/11/16. No evidence of recurrence on today's exam.  I will see her back for cervical cancer survellance in May, 2018.   HPI: Lori [name] is a 65 year old woman is seen in consultation at the request of Dr. Adah Perl for postmenopausal bleeding and adenocarcinoma.   The patient reports having postmenopausal bleeding sent March 2016. She was evaluated with a Pap smear in April 2016 which revealed ASC-H. she then presented for colposcopic evaluation at which time an abnormal cervix was visualized. An endometrial biopsy was taken and this revealed adenocarcinoma with special stains consistent with an endocervical primary (CEA and P 16 positivity). She underwent a CT scan of the abdomen and pelvis on 04/09/2015 and this revealed no lymphadenopathy, mild low attenuation thickening of the endometrium, no gross metastatic disease.  She underwent PET/CT on 05/31/16 which showed intense cervical activity, a clinically positive left external iliac node and intense activity at the distal appendix with a thickened appendiceal wall (3mm) in this region.  She received definitive chemoradiation with external beam and brachytherapy and weekly cddp at [facility] with Dr's Kinard and Neijstrom. Treatment was completed on 08/26/15 without treatment delays.  Post-treatment PET CT on 01/12/16 showed complete response in the pelvic and pelvic nodal regions but a new Ill-defined adenopathy in the left periaortic station measures approximately 12  mm with an SUV max of 6.3. No additional hypermetabolic lymph nodes The appendiceal lesion continued to be PET positive.  The para-aortic node received definitive radiation with Dr Sondra Come in [place] until March, 2017. Post-treatment repeat PET CT on 04/04/16 showed persistent increased avidity in the appendix but complete response at the PA nodal region.  On 04/19/16 Dr Rolanda Jay took the patient to the operating room for a laparoscopic appendectomy with final pathology revealing a mucinous adenocarcinoma extending into periappendiceal connective tissue and serosal surface of tumor. The proximal GI margin was free of tumor. The mesenteric margin was involved by tumor. There was perineural invasion present. It was staged as T4a. Comparison to her original cervical cancer revealed it to be a separate primary tumor.  After extensive discussion, it was determined that expectant management would be the best course of management for this patient as there was considerable concern about re-operating to perform hemicolectomy given her history of prior abdominal radiation.  Interval Hx: She has done well and is feeling well. She has no pain or bleeding.  PET on 08/11/16 showed no hypermetabolic nodes and no evidence of recurrence of appendiceal tumor.   Current Meds:  Outpatient Encounter Prescriptions as of 10/18/2016  Medication Sig  . amLODipine (NORVASC) 10 MG tablet Take 10 mg by mouth every morning.   Marland Kitchen aspirin EC 81 MG tablet Take 81 mg by mouth daily.  . benazepril (LOTENSIN) 40 MG tablet Take 40 mg by mouth every morning.   . carbamazepine (TEGRETOL) 100 MG chewable tablet Chew 100 mg by mouth 3 (three) times daily.   . cholecalciferol (VITAMIN D) 1000 units tablet Take 1,000 Units by mouth daily. 2 tablets daily  .  cloNIDine (CATAPRES) 0.2 MG tablet Take 0.2 mg by mouth 3 (three) times daily.   Marland Kitchen gabapentin (NEURONTIN) 300 MG capsule Take 300 mg by mouth 2 (two) times daily.   . hydrochlorothiazide  (HYDRODIURIL) 25 MG tablet Take 25 mg by mouth every morning.   . hydrocortisone cream 1 % Apply topically 2 (two) times daily.  . magnesium oxide (MAG-OX) 400 (241.3 Mg) MG tablet Take 400 mg by mouth daily.   . metoprolol (LOPRESSOR) 50 MG tablet Take 25-50 mg by mouth 2 (two) times daily. Take half a tablet in the morning and half a tablet in the pm  . Multiple Vitamins-Minerals (MULTIVITAMIN WITH MINERALS) tablet Take 1 tablet by mouth daily.  . potassium chloride SA (K-DUR,KLOR-CON) 20 MEQ tablet Take 20 mEq by mouth 2 (two) times daily.   . potassium chloride SA (K-DUR,KLOR-CON) 20 MEQ tablet Take by mouth.  . pravastatin (PRAVACHOL) 20 MG tablet Take 20 mg by mouth every evening.   . loperamide (IMODIUM A-D) 2 MG tablet Take 2 mg by mouth 4 (four) times daily as needed for diarrhea or loose stools. Reported on 03/28/2016  . [DISCONTINUED] ibuprofen (ADVIL,MOTRIN) 400 MG tablet Take 400 mg by mouth every 6 (six) hours as needed (For pain.). Reported on 03/28/2016  . [DISCONTINUED] magnesium oxide (MAG-OX) 400 MG tablet TAKE 1 TABLET BY MOUTH TWICE DAILY - EMERGENCY REFILL FAXED DR  . [DISCONTINUED] oxyCODONE-acetaminophen (ROXICET) 5-325 MG tablet Take 1 tablet by mouth every 6 (six) hours as needed.   No facility-administered encounter medications on file as of 10/18/2016.     Allergy:  Allergies  Allergen Reactions  . Contrast Media [Iodinated Diagnostic Agents] Itching and Swelling  . Dilantin [Phenytoin Sodium Extended] Itching, Swelling and Other (See Comments)    Whole body  . Latex Hives and Itching  . Adhesive [Tape] Rash    Social Hx:   Social History   Social History  . Marital status: Single    Spouse name: N/A  . Number of children: N/A  . Years of education: N/A   Occupational History  . Not on file.   Social History Main Topics  . Smoking status: Former Smoker    Packs/day: 0.25    Years: 5.00    Types: Cigarettes    Quit date: 12/05/1995  . Smokeless  tobacco: Never Used  . Alcohol use No  . Drug use: No  . Sexual activity: Not on file   Other Topics Concern  . Not on file   Social History Narrative  . No narrative on file    Past Surgical Hx:  Past Surgical History:  Procedure Laterality Date  . COLONOSCOPY    . CRANIOTOMY POSTERIOR FOSSA  1997   Repair aneurysm  x2  left side  . LAPAROSCOPIC APPENDECTOMY N/A 04/19/2016   Procedure: APPENDECTOMY LAPAROSCOPIC;  Surgeon: Hall Busing, MD;  Location: [facility];  Service: General;  Laterality: N/A;  . PORT-A-CATH PLACEMENT  06/  2016  . TANDEM RING INSERTION N/A 07/28/2015   Procedure: TANDEM RING PLACEMENT;  Surgeon: Gery Pray, MD;  Location: [facility];  Service: Urology;  Laterality: N/A;  . TANDEM RING INSERTION N/A 08/05/2015   Procedure: TANDEM RING PLACEMENT ;  Surgeon: Gery Pray, MD;  Location: [facility];  Service: Urology;  Laterality: N/A;  . TANDEM RING INSERTION N/A 08/11/2015   Procedure: TANDEM RING PLACEMENT ;  Surgeon: Gery Pray, MD;  [facility];  Service: Urology;  Laterality: N/A;  . TANDEM RING INSERTION N/A 08/17/2015   Procedure: TANDEM RING PLACEMENT ;  Surgeon: Gery Pray, MD;  Location: [facility];  Service: Urology;  Laterality: N/A;  . TANDEM RING INSERTION N/A 08/26/2015   Procedure: TANDEM RING PLACEMENT ;  Surgeon: Gery Pray, MD;  Location: [facility];  Service: Urology;  Laterality: N/A;    Past Medical Hx:  Past Medical History:  Diagnosis Date  . Anemia 2017   3u blood 2017  . Arthritis    KNEES  . Cervical carcinoma [facility]) oncologist--  dr Sondra Come for radiation/   [facility] for chemotherapy   endocervical adenocarcinoma w/  Nodal METS--  FIGO Stage IIB  . Depression   . Endometrial carcinoma [facility])    goes to [facility] in [place] for chemotherapy  . GERD (gastroesophageal reflux disease)   . Heart murmur   . History  of blood transfusion   . 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 History of seizures    post-op craniotomy for aneurysm 1997-- controlled w/ medication since then no seizures  . Hyperlipidemia   . Hypertension   . PMB (postmenopausal bleeding)   . Seizures ([place])    hx of years ago   . Type 2 diabetes, diet controlled ([place])    diet controlled     Past Gynecological History:  SVD x 3  No LMP recorded. Patient is postmenopausal.  Family Hx:  Family History  Problem Relation Age of Onset  . Throat cancer Father   . Cervical cancer Sister     Review of Systems:  Constitutional  Feels well,    ENT Normal appearing ears and nares bilaterally Skin/Breast  No rash, sores, jaundice, itching, dryness Cardiovascular  No chest pain, shortness of breath, or edema  Pulmonary  No cough or wheeze.  Gastro Intestinal  No nausea, vomitting, or diarrhoea. No bright red blood per rectum, no abdominal pain, change in bowel movement, or constipation.  Genito Urinary  No frequency, urgency, dysuria, see HPI Musculo Skeletal  No myalgia, arthralgia, joint swelling or pain  Neurologic  No weakness, numbness, change in gait,  Psychology  No depression, anxiety, insomnia.   Vitals:  Blood pressure 121/69, pulse (!) 58, temperature 98.4 F (36.9 C), temperature source Oral, resp. rate 18, height 5' 7.5" (1.715 m), weight 210 lb 6.4 oz (95.4 kg), SpO2 100 %.  Physical Exam: WD in NAD Neck  Supple NROM, without any enlargements.  Lymph Node Survey No cervical supraclavicular or inguinal adenopathy Cardiovascular  Pulse normal rate, regularity and rhythm. S1 and S2 normal.  Lungs  Clear to auscultation bilateraly, without wheezes/crackles/rhonchi. Good air movement.  Skin  No rash/lesions/breakdown  Psychiatry  Alert and oriented to person, place, and time  Abdomen  Normoactive bowel sounds, abdomen soft,  non-tender and overweight without evidence of hernia. Well healed laparoscopic incisions. Back No CVA tenderness Genito Urinary  Vulva/vagina: Normal external female genitalia.  No lesions. No discharge or bleeding.  Bladder/urethra:  No lesions or masses, well supported bladder  Vagina: Grossly normal with radiation changes and loss of rugation.  Cervix: Cervix flush with upper vagina. Some agglutination and narrowing of upper vagina.  Uterus: Small, mobile, no parametrial involvement or nodularity.  Adnexa: no discrete masses. Rectal  Good tone, no masses no cul de sac nodularity.  Extremities  No bilateral cyanosis, clubbing or  edema.   Donaciano Eva, MD   10/18/2016, 5:23 PM

## 2016-10-18 NOTE — Patient Instructions (Signed)
Plan to follow up with Dr Everitt Amber in April 2018. Please call with any changes , questions or concerns.  Thank you

## 2016-11-08 ENCOUNTER — Encounter: Payer: Self-pay | Admitting: Oncology

## 2016-11-09 ENCOUNTER — Ambulatory Visit: Payer: Medicare Other | Admitting: Radiation Oncology

## 2016-11-17 ENCOUNTER — Encounter (HOSPITAL_COMMUNITY): Payer: Medicare Other | Attending: Hematology & Oncology

## 2016-11-17 ENCOUNTER — Encounter (HOSPITAL_COMMUNITY): Payer: Self-pay

## 2016-11-17 VITALS — BP 132/67 | HR 63 | Temp 98.2°F | Resp 18

## 2016-11-17 DIAGNOSIS — C181 Malignant neoplasm of appendix: Secondary | ICD-10-CM

## 2016-11-17 DIAGNOSIS — Z452 Encounter for adjustment and management of vascular access device: Secondary | ICD-10-CM | POA: Diagnosis not present

## 2016-11-17 DIAGNOSIS — Z95828 Presence of other vascular implants and grafts: Secondary | ICD-10-CM | POA: Insufficient documentation

## 2016-11-17 DIAGNOSIS — C772 Secondary and unspecified malignant neoplasm of intra-abdominal lymph nodes: Secondary | ICD-10-CM

## 2016-11-17 MED ORDER — HEPARIN SOD (PORK) LOCK FLUSH 100 UNIT/ML IV SOLN
500.0000 [IU] | Freq: Once | INTRAVENOUS | Status: AC
  Administered 2016-11-17: 500 [IU] via INTRAVENOUS

## 2016-11-17 MED ORDER — HEPARIN SOD (PORK) LOCK FLUSH 100 UNIT/ML IV SOLN: 500.0000 [IU] | Freq: Once | INTRAVENOUS | Status: DC

## 2016-11-17 MED ORDER — SODIUM CHLORIDE 0.9% FLUSH: 20.0000 mL | INTRAVENOUS | Status: DC | PRN

## 2016-11-17 MED ORDER — SODIUM CHLORIDE 0.9% FLUSH
10.0000 mL | INTRAVENOUS | Status: DC | PRN
  Administered 2016-11-17: 10 mL via INTRAVENOUS
  Filled 2016-11-17: qty 10

## 2016-11-17 NOTE — Patient Instructions (Signed)
[facility] at [facility] Discharge Instructions  RECOMMENDATIONS MADE BY THE CONSULTANT AND ANY TEST RESULTS WILL BE SENT TO YOUR REFERRING PHYSICIAN.  Port flush today. Return as scheduled for port flushes. Return as scheduled for office visit.   Thank you for choosing [facility] at [facility] to provide your oncology and hematology care.  To afford each patient quality time with our provider, please arrive at least 15 minutes before your scheduled appointment time.   Beginning January 23rd 2017 lab work for the Cancer Center will be done in the  Main lab at [place] on 1st floor. If you have a lab appointment with the Cancer Center please come in thru the  Main Entrance and check in at the main information desk  You need to re-schedule your appointment should you arrive 10 or more minutes late.  We strive to give you quality time with our providers, and arriving late affects you and other patients whose appointments are after yours.  Also, if you no show three or more times for appointments you may be dismissed from the clinic at the providers discretion.     Again, thank you for choosing [facility].  Our hope is that these requests will decrease the amount of time that you wait before being seen by our physicians.       _____________________________________________________________  Should you have questions after your visit to  Cancer Center, please contact our office at [phone] between the hours of 8:30 a.m. and 4:30 p.m.  Voicemails left after 4:30 p.m. will not be returned until the following business day.  For prescription refill requests, have your pharmacy contact our office.         Resources For Cancer Patients and their Caregivers ? American Cancer Society: Can assist with transportation, wigs, general needs, runs Look Good Feel Better.        1-888-227-6333 ? Cancer Care: Provides financial  assistance, online support groups, medication/co-pay assistance.  1-800-813-HOPE (4673) ? [facility] Assists [place] cancer patients and their families through emotional , educational and financial support.  336-427-4357 ? [place] DSS Where to apply for food stamps, Medicaid and utility assistance. 336-342-1394 ? [facility]: Transportation to medical appointments. 336-347-2287 ? Social Security Administration: May apply for disability if have a Stage IV cancer. 336-342-7796 1-800-772-1213 ? [place] Aging, Disability and Transit Services: Assists with nutrition, care and transit needs. 336-349-2343  Cancer Center Support Programs: [email or web address]@ > Cancer Support Group  2nd Tuesday of the month 1pm-2pm, Journey Room  > Creative Journey  3rd Tuesday of the month 1130am-1pm, Journey Room  > Look Good Feel Better  1st Wednesday of the month 10am-12 noon, Journey Room (Call American Cancer Society to register 1-800-395-5775)   

## 2016-11-17 NOTE — Progress Notes (Signed)
Lori [name] presented for Portacath access and flush.  Portacath located rightchest wall accessed with  H 20 needle.  Good blood return present. Portacath flushed with 16ml NS and 500U/18ml Heparin and needle removed intact.  Procedure tolerated well and without incident.

## 2017-02-02 ENCOUNTER — Encounter (HOSPITAL_COMMUNITY)
Admission: RE | Admit: 2017-02-02 | Discharge: 2017-02-02 | Disposition: A | Discharge status: Home / Self Care | Payer: Medicare Other | Source: Ambulatory Visit | Attending: Hematology & Oncology | Admitting: Hematology & Oncology

## 2017-02-02 DIAGNOSIS — C181 Malignant neoplasm of appendix: Secondary | ICD-10-CM | POA: Insufficient documentation

## 2017-02-02 DIAGNOSIS — C539 Malignant neoplasm of cervix uteri, unspecified: Secondary | ICD-10-CM | POA: Diagnosis present

## 2017-02-02 LAB — GLUCOSE, CAPILLARY: Glucose-Capillary: 152 mg/dL — ABNORMAL HIGH (ref 65–99)

## 2017-02-02 MED ORDER — FLUDEOXYGLUCOSE F - 18 (FDG) INJECTION
10.5600 | Freq: Once | INTRAVENOUS | Status: AC | PRN
  Administered 2017-02-02: 10.56 via INTRAVENOUS

## 2017-02-07 ENCOUNTER — Encounter (HOSPITAL_COMMUNITY): Payer: Medicare Other | Attending: Oncology | Admitting: Oncology

## 2017-02-07 ENCOUNTER — Encounter (HOSPITAL_COMMUNITY): Payer: Self-pay | Admitting: Oncology

## 2017-02-07 ENCOUNTER — Encounter (HOSPITAL_BASED_OUTPATIENT_CLINIC_OR_DEPARTMENT_OTHER): Payer: Medicare Other

## 2017-02-07 VITALS — BP 144/79 | HR 65 | Temp 98.5°F | Resp 18 | Wt 218.3 lb

## 2017-02-07 DIAGNOSIS — Z95828 Presence of other vascular implants and grafts: Secondary | ICD-10-CM | POA: Insufficient documentation

## 2017-02-07 DIAGNOSIS — C181 Malignant neoplasm of appendix: Secondary | ICD-10-CM

## 2017-02-07 DIAGNOSIS — C539 Malignant neoplasm of cervix uteri, unspecified: Secondary | ICD-10-CM | POA: Diagnosis not present

## 2017-02-07 DIAGNOSIS — C7989 Secondary malignant neoplasm of other specified sites: Secondary | ICD-10-CM | POA: Diagnosis not present

## 2017-02-07 LAB — CBC WITH DIFFERENTIAL/PLATELET
Basophils Absolute: 0 10*3/uL (ref 0.0–0.1)
Basophils Relative: 0 %
EOS ABS: 0.1 10*3/uL (ref 0.0–0.7)
Eosinophils Relative: 3 %
HEMATOCRIT: 32.1 % — AB (ref 36.0–46.0)
HEMOGLOBIN: 10.7 g/dL — AB (ref 12.0–15.0)
LYMPHS ABS: 0.6 10*3/uL — AB (ref 0.7–4.0)
Lymphocytes Relative: 14 %
MCH: 31.2 pg (ref 26.0–34.0)
MCHC: 33.3 g/dL (ref 30.0–36.0)
MCV: 93.6 fL (ref 78.0–100.0)
MONO ABS: 0.4 10*3/uL (ref 0.1–1.0)
MONOS PCT: 8 %
NEUTROS PCT: 75 %
Neutro Abs: 3.5 10*3/uL (ref 1.7–7.7)
Platelets: 184 10*3/uL (ref 150–400)
RBC: 3.43 MIL/uL — ABNORMAL LOW (ref 3.87–5.11)
RDW: 14.3 % (ref 11.5–15.5)
WBC: 4.6 10*3/uL (ref 4.0–10.5)

## 2017-02-07 LAB — COMPREHENSIVE METABOLIC PANEL
ALK PHOS: 124 U/L (ref 38–126)
ALT: 15 U/L (ref 14–54)
AST: 18 U/L (ref 15–41)
Albumin: 3.6 g/dL (ref 3.5–5.0)
Anion gap: 6 (ref 5–15)
BILIRUBIN TOTAL: 0.5 mg/dL (ref 0.3–1.2)
BUN: 17 mg/dL (ref 6–20)
CALCIUM: 9 mg/dL (ref 8.9–10.3)
CO2: 31 mmol/L (ref 22–32)
CREATININE: 1.06 mg/dL — AB (ref 0.44–1.00)
Chloride: 97 mmol/L — ABNORMAL LOW (ref 101–111)
GFR, EST NON AFRICAN AMERICAN: 53 mL/min — AB (ref >60)
Glucose, Bld: 154 mg/dL — ABNORMAL HIGH (ref 65–99)
Potassium: 3.4 mmol/L — ABNORMAL LOW (ref 3.5–5.1)
Sodium: 134 mmol/L — ABNORMAL LOW (ref 135–145)
TOTAL PROTEIN: 7.2 g/dL (ref 6.5–8.1)

## 2017-02-07 MED ORDER — SODIUM CHLORIDE 0.9% FLUSH
10.0000 mL | Freq: Once | INTRAVENOUS | Status: AC
Start: 2017-02-07 — End: 2017-02-07
  Administered 2017-02-07: 10 mL via INTRAVENOUS

## 2017-02-07 MED ORDER — HEPARIN SOD (PORK) LOCK FLUSH 100 UNIT/ML IV SOLN
500.0000 [IU] | Freq: Once | INTRAVENOUS | Status: AC
  Administered 2017-02-07: 500 [IU] via INTRAVENOUS
  Filled 2017-02-07: qty 5

## 2017-02-07 NOTE — Progress Notes (Signed)
[place] [facility]  Progress Note  Patient Care Team: [name], MD as PCP - General (Internal Medicine)  CHIEF COMPLAINTS/PURPOSE OF CONSULTATION:   Oncology History   1. 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 Positive para-aortic LN on PET s/p XRT 3. 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 Sondra Come      06/22/2015 - 07/26/2015 Chemotherapy    Weekly Cisplatin      01/12/2016 PET scan    Single hypermetabolic retroperitoneal L periaortic LN worrisome for metastatic disease      04/04/2016 PET scan    Interval decrease in FDG uptake with retroperitoneal LN, unchanged appearance of distal appendix which appears thickened and exhibits mild increased uptake      04/19/2016 Surgery    Laparoscopic Appendectomy with Dr. Josue Hector. [place]      04/19/2016 Pathology Results    Mucinous adenocarcinoma extend into periappend connect tissue and serosal surface, 2.2 cm in size, grade 2, +PNI pT4a,pNX      04/19/2016 Cancer Diagnosis    pT4a,pNx Mucinous adenocarcinoma of the appendix      08/11/2016 PET scan    No evidence of hypermetabolic appendiceal carcinoma recurrence or metastasis in the abdomen or pelvis. 2. Metabolic activity at the level the cervix difficult to assess as adjacent to bladder activity. 3. No hypermetabolic lymph nodes in the abdomen or pelvis. No distant disease.      02/02/2017 PET scan    1. New hypermetabolic 0.7 cm medial right upper lobe pulmonary nodule. Slight growth of additional tiny 0.3 cm apical right upper lobe pulmonary nodule. Findings are suspicious for pulmonary metastases. 2. No additional sites of hypermetabolic metastatic disease. 3. No evidence of  hypermetabolic recurrence in the cervix or appendectomy bed. 4. Additional findings include aortic atherosclerosis, left main and 3 vessel coronary atherosclerosis, stable mild cardiomegaly and stable small pericardial effusion/thickening.       HISTORY OF PRESENTING ILLNESS:  Lori [name] 66 y.o. female returns for follow-up of resected mucinous adenocarcinoma of the appendix and cervical carcinoma.   She has been doing well. She has some intermittent abdominal pain after a bowel movement. Denies chest pain, SOB, or any other concerns.    MEDICAL HISTORY:  Past Medical History:  Diagnosis Date  . Anemia 2017   3u blood 2017  . Arthritis    KNEES  . Cervical carcinoma [facility]) oncologist--  dr Sondra Come for radiation/   [facility] for chemotherapy   endocervical adenocarcinoma w/  Nodal METS--  FIGO Stage IIB  . Depression   . Endometrial carcinoma [facility])    goes to [facility] in [place] for chemotherapy  . GERD (gastroesophageal reflux disease)   . Heart murmur   . History of blood transfusion   . 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 History of radiation therapy 07/28/15-08/26/15   cervical region 27.5 gray  . History of seizures    post-op craniotomy for aneurysm 1997-- controlled w/ medication since then no seizures  . Hyperlipidemia   . Hypertension   . PMB (postmenopausal bleeding)   . Seizures ([place])    hx  of years ago   . Type 2 diabetes, diet controlled ([place])    diet controlled     SURGICAL HISTORY: Past Surgical History:  Procedure Laterality Date  . COLONOSCOPY    . CRANIOTOMY POSTERIOR FOSSA  1997   Repair aneurysm  x2  left side  . LAPAROSCOPIC APPENDECTOMY N/A 04/19/2016   Procedure: APPENDECTOMY LAPAROSCOPIC;  Surgeon: Hall Busing, MD;  Location: [facility];  Service: General;  Laterality: N/A;  . PORT-A-CATH PLACEMENT  06/  2016  . TANDEM RING  INSERTION N/A 07/28/2015   Procedure: TANDEM RING PLACEMENT;  Surgeon: Gery Pray, MD;  Location: [facility];  Service: Urology;  Laterality: N/A;  . TANDEM RING INSERTION N/A 08/05/2015   Procedure: TANDEM RING PLACEMENT ;  Surgeon: Gery Pray, MD;  Location: [facility];  Service: Urology;  Laterality: N/A;  . TANDEM RING INSERTION N/A 08/11/2015   Procedure: TANDEM RING PLACEMENT ;  Surgeon: Gery Pray, MD;  Location[facility];  Service: Urology;  Laterality: N/A;  . TANDEM RING INSERTION N/A 08/17/2015   Procedure: TANDEM RING PLACEMENT ;  Surgeon: Gery Pray, MD;  Location: [facility];  Service: Urology;  Laterality: N/A;  . TANDEM RING INSERTION N/A 08/26/2015   Procedure: TANDEM RING PLACEMENT ;  Surgeon: Gery Pray, MD;  Location: [facility];  Service: Urology;  Laterality: N/A;    SOCIAL HISTORY: Social History   Social History  . Marital status: Single    Spouse name: N/A  . Number of children: N/A  . Years of education: N/A   Occupational History  . Not on file.   Social History Main Topics  . Smoking status: Former Smoker    Packs/day: 0.25    Years: 5.00    Types: Cigarettes    Quit date: 12/05/1995  . Smokeless tobacco: Never Used  . Alcohol use No  . Drug use: No  . Sexual activity: Not on file   Other Topics Concern  . Not on file   Social History Narrative  . No narrative on file   Single 2 sons 6 grandchildren Born in [place]. Lives in [place] with her 3 sisters. Ex-smoker. Quit about 20 years ago. She had two aneurysms. She enjoys playing games on her tablet. Worked until she had an aneurysm, worked in [employer].  FAMILY HISTORY: Family History  Problem Relation Age of Onset  . Throat cancer Father   . Cervical cancer Sister    Mother deceased in her late 27s, possibly from a heart attack. She passed around the time the patient had an aneurysm Father deceased in his  8s of throat cancer. He used to smoke and had quit. 3 sisters and 2 brothers 1 sister had cervical cancer  ALLERGIES:  is allergic to contrast media [iodinated diagnostic agents]; dilantin [phenytoin sodium extended]; latex; and adhesive [tape].  MEDICATIONS:  Current Outpatient Prescriptions  Medication Sig Dispense Refill  . amLODipine (NORVASC) 10 MG tablet Take 10 mg by mouth every morning.   5  . aspirin EC 81 MG tablet Take 81 mg by mouth daily.    . benazepril (LOTENSIN) 40 MG tablet Take 40 mg by mouth every morning.   5  . carbamazepine (TEGRETOL) 100 MG chewable tablet Chew 100 mg by mouth 3 (three) times daily.   5  . cholecalciferol (VITAMIN D) 1000 units tablet Take 1,000 Units by mouth daily. 2 tablets daily    . cloNIDine (CATAPRES) 0.2 MG  tablet Take 0.2 mg by mouth 3 (three) times daily.   6  . gabapentin (NEURONTIN) 300 MG capsule Take 300 mg by mouth 2 (two) times daily.   5  . hydrochlorothiazide (HYDRODIURIL) 25 MG tablet Take 25 mg by mouth every morning.   11  . hydrocortisone cream 1 % Apply topically 2 (two) times daily.  1  . loperamide (IMODIUM A-D) 2 MG tablet Take 2 mg by mouth 4 (four) times daily as needed for diarrhea or loose stools. Reported on 03/28/2016    . magnesium oxide (MAG-OX) 400 (241.3 Mg) MG tablet Take 400 mg by mouth daily.     . metoprolol (LOPRESSOR) 50 MG tablet Take 25-50 mg by mouth 2 (two) times daily. Take half a tablet in the morning and half a tablet in the pm  5  . Multiple Vitamins-Minerals (MULTIVITAMIN WITH MINERALS) tablet Take 1 tablet by mouth daily.    . potassium chloride SA (K-DUR,KLOR-CON) 20 MEQ tablet Take 20 mEq by mouth 2 (two) times daily.   6  . potassium chloride SA (K-DUR,KLOR-CON) 20 MEQ tablet Take by mouth.    . pravastatin (PRAVACHOL) 20 MG tablet Take 20 mg by mouth every evening.   2   No current facility-administered medications for this visit.    Facility-Administered Medications Ordered in Other Visits    Medication Dose Route Frequency Provider Last Rate Last Dose  . heparin lock flush 100 unit/mL  500 Units Intravenous Once Twana First, MD      . sodium chloride flush (NS) 0.9 % injection 10 mL  10 mL Intravenous Once Twana First, MD        Review of Systems  Constitutional: Negative.   HENT: Negative.   Eyes: Negative.   Respiratory: Negative.  Negative for shortness of breath.   Cardiovascular: Positive for leg swelling (left calf). Negative for chest pain.  Gastrointestinal: Positive for abdominal pain (intermittent, after bowel movement).  Genitourinary: Negative.   Musculoskeletal: Negative.   Skin: Negative.   Neurological: Negative.   Endo/Heme/Allergies: Negative.   Psychiatric/Behavioral: Negative.   All other systems reviewed and are negative. 14 point ROS was done and is otherwise as detailed above or in HPI  PHYSICAL EXAMINATION: ECOG PERFORMANCE STATUS: 1 - Symptomatic but completely ambulatory  There were no vitals filed for this visit. Filed Weights   02/07/17 1153  Weight: 218 lb 4.8 oz (99 kg)   Physical Exam  Constitutional: She is oriented to person, place, and time and well-developed, well-nourished, and in no distress.  HENT:  Head: Normocephalic and atraumatic.  Nose: Nose normal.  Mouth/Throat: Oropharynx is clear and moist. No oropharyngeal exudate.  Eyes: Conjunctivae and EOM are normal. Pupils are equal, round, and reactive to light. Right eye exhibits no discharge. Left eye exhibits no discharge. No scleral icterus.  Neck: Normal range of motion. Neck supple. No tracheal deviation present. No thyromegaly present.  Cardiovascular: Normal rate, regular rhythm and normal heart sounds.  Exam reveals no gallop and no friction rub.   No murmur heard. Pulmonary/Chest: Effort normal and breath sounds normal. She has no wheezes. She has no rales.  Abdominal: Soft. Bowel sounds are normal. She exhibits no distension and no mass. There is no tenderness.  There is no rebound and no guarding.  Musculoskeletal: Normal range of motion. She exhibits no edema.  Lymphadenopathy:    She has no cervical adenopathy.  Neurological: She is alert and oriented to person, place, and time. She has normal reflexes.  No cranial nerve deficit. Gait normal. Coordination normal.  Skin: Skin is warm and dry. No rash noted.  Psychiatric: Mood, memory, affect and judgment normal.  Nursing note and vitals reviewed.  LABORATORY DATA:  I have reviewed the data as listed Lab Results  Component Value Date   WBC 5.7 08/01/2016   HGB 11.3 (L) 08/01/2016   HCT 34.5 (L) 08/01/2016   MCV 95.3 08/01/2016   PLT 198 08/01/2016   CMP     Component Value Date/Time   NA 139 08/01/2016 1505   K 3.5 08/01/2016 1505   CL 101 08/01/2016 1505   CO2 32 08/01/2016 1505   GLUCOSE 116 (H) 08/01/2016 1505   BUN 14 08/01/2016 1505   CREATININE 1.05 (H) 08/01/2016 1505   CALCIUM 8.9 08/01/2016 1505   PROT 7.8 08/01/2016 1505   ALBUMIN 4.0 08/01/2016 1505   AST 16 08/01/2016 1505   ALT 12 (L) 08/01/2016 1505   ALKPHOS 117 08/01/2016 1505   BILITOT 0.2 (L) 08/01/2016 1505   GFRNONAA 55 (L) 08/01/2016 1505   GFRAA >60 08/01/2016 1505     RADIOGRAPHIC STUDIES: I have personally reviewed the radiological images as listed and agreed with the findings in the report.  Restaging PET scan 02/02/2017 IMPRESSION: 1. New hypermetabolic 0.7 cm medial right upper lobe pulmonary nodule. Slight growth of additional tiny 0.3 cm apical right upper lobe pulmonary nodule. Findings are suspicious for pulmonary metastases. 2. No additional sites of hypermetabolic metastatic disease. 3. No evidence of hypermetabolic recurrence in the cervix or appendectomy bed. 4. Additional findings include aortic atherosclerosis, left main and 3 vessel coronary atherosclerosis, stable mild cardiomegaly and stable small pericardial effusion/thickening.  PATHOLOGY    ASSESSMENT & PLAN:  Mucinous  adenocarcinoma of the appendix pT4a,pNX History of cervical cancer treated with chemotherapy and radiation therapy Appendectomy on 04/19/2016 revealed mucinous adenocarcinoma extending into periappendiceal connective tissue and serosal surface History of aneurysms  I reviewed the PET scan with the patient in detail. There is a new 7 mm nodule and 27mm nodule in the RUL, otherwise no evidence of new disease.    I have referred her to radiation oncology in [facility] for the two nodules in her lungs to see if she's a candidate for SBRT to these 2 lung masses.  Port flush today.   Repeat CT scan in 3 months.   She will return for follow up in 6 weeks.    All questions were answered. The patient knows to call the clinic with any problems, questions or concerns.  This document serves as a record of services personally performed by Twana First, MD. It was created on her behalf by Martinique Casey, a trained medical scribe. The creation of this record is based on the scribe's personal observations and the provider's statements to them. This document has been checked and approved by the attending provider.  I have reviewed the above documentation for accuracy and completeness, and I agree with the above.  This note was electronically signed.    Martinique M Casey  02/07/2017 11:54 AM

## 2017-02-07 NOTE — Patient Instructions (Signed)
[place] at [facility] Discharge Instructions  RECOMMENDATIONS MADE BY THE CONSULTANT AND ANY TEST RESULTS WILL BE SENT TO YOUR REFERRING PHYSICIAN.  You were seen today by Dr. Twana First We will refer you to [facility] radiation oncologist Follow up in 6 weeks See Amy up front for appointments   Thank you for choosing [place] at [facility] to provide your oncology and hematology care.  To afford each patient quality time with our provider, please arrive at least 15 minutes before your scheduled appointment time.    If you have a lab appointment with the [facility] please come in thru the  Main Entrance and check in at the main information desk  You need to re-schedule your appointment should you arrive 10 or more minutes late.  We strive to give you quality time with our providers, and arriving late affects you and other patients whose appointments are after yours.  Also, if you no show three or more times for appointments you may be dismissed from the clinic at the providers discretion.     Again, thank you for choosing [facility].  Our hope is that these requests will decrease the amount of time that you wait before being seen by our physicians.       _____________________________________________________________  Should you have questions after your visit to [facility], please contact our office at [phone] between the hours of 8:30 a.m. and 4:30 p.m.  Voicemails left after 4:30 p.m. will not be returned until the following business day.  For prescription refill requests, have your pharmacy contact our office.       Resources For Cancer Patients and their Caregivers ? American Cancer Society: Can assist with transportation, wigs, general needs, runs Look Good Feel Better.        873-322-8730 ? Cancer Care: Provides financial assistance, online support groups, medication/co-pay assistance.   1-800-813-HOPE 316-108-7604) ? [facility] Assists [place] cancer patients and their families through emotional , educational and financial support.  9072252900 ? [place] DSS Where to apply for food stamps, Medicaid and utility assistance. 919-382-3813 ? [facility]: Transportation to medical appointments. (670)027-4724 ? Social Security Administration: May apply for disability if have a Stage IV cancer. (212)165-9970 203-737-2843 ? [employer], Disability and Transit Services: Assists with nutrition, care and transit needs. [place] Support Programs: @10RELATIVEDAYS @ > Cancer Support Group  2nd Tuesday of the month 1pm-2pm, Journey Room  > Creative Journey  3rd Tuesday of the month 1130am-1pm, Journey Room  > Look Good Feel Better  1st Wednesday of the month 10am-12 noon, Journey Room (Call [facility] to register [phone])

## 2017-02-07 NOTE — Progress Notes (Signed)
Lori [name] presented for Portacath access and flush. Proper placement of portacath confirmed by CXR. Portacath located right chest wall accessed with  H 20 needle. Good blood return present. Portacath flushed with 84ml NS and 500U/62ml Heparin and needle removed intact. Procedure without incident. Patient tolerated procedure well.

## 2017-02-09 ENCOUNTER — Encounter: Payer: Self-pay | Admitting: Radiation Oncology

## 2017-02-14 NOTE — Progress Notes (Signed)
Histology and Location of Primary Cancer: resected mucinous adenocarcinoma of the appendix and cervical carcinoma   Location(s) of Symptomatic tumor(s): new 7 mm nodule and 68mm nodule in the RUL  Past/Anticipated chemotherapy by medical oncology, if any: weekly cisplatin from 06/22/15 - 07/26/15  Patient's main complaints related to symptomatic tumor(s) are: None, denies shortness of breath, fullness or lump in throat, no difficulty swallowing, no pain, no decrease appetite, no fatigue.    Pain on a scale of 0-10 is: 0  SAFETY ISSUES:  Prior radiation? 07/28/15-08/26/15 cervical region 27.5 gray, tandem and ring x 5 - 07/28/15 - 08/26/15  Pacemaker/ICD? no  Possible current pregnancy? no  Is the patient on methotrexate? no  Additional Complaints / other details:  None  Vitals:   02/19/17 0834  BP: 135/80  Pulse: (!) 54  Resp: 18  Temp: 98 F (36.7 C)  TempSrc: Oral  SpO2: 100%  Weight: 218 lb (98.9 kg)    Wt Readings from Last 3 Encounters:  02/19/17 218 lb (98.9 kg)  02/07/17 218 lb 4.8 oz (99 kg)  10/18/16 210 lb 6.4 oz (95.4 kg)

## 2017-02-19 ENCOUNTER — Ambulatory Visit
Admission: RE | Admit: 2017-02-19 | Discharge: 2017-02-19 | Disposition: A | Discharge status: Home / Self Care | Payer: Medicare Other | Source: Ambulatory Visit | Attending: Radiation Oncology | Admitting: Radiation Oncology

## 2017-02-19 ENCOUNTER — Encounter: Payer: Self-pay | Admitting: Radiation Oncology

## 2017-02-19 ENCOUNTER — Ambulatory Visit: Payer: Medicare Other

## 2017-02-19 VITALS — BP 135/80 | HR 54 | Temp 98.0°F | Resp 18 | Wt 218.0 lb

## 2017-02-19 DIAGNOSIS — Z9889 Other specified postprocedural states: Secondary | ICD-10-CM | POA: Insufficient documentation

## 2017-02-19 DIAGNOSIS — Z9109 Other allergy status, other than to drugs and biological substances: Secondary | ICD-10-CM | POA: Insufficient documentation

## 2017-02-19 DIAGNOSIS — C801 Malignant (primary) neoplasm, unspecified: Secondary | ICD-10-CM | POA: Insufficient documentation

## 2017-02-19 DIAGNOSIS — Z91040 Latex allergy status: Secondary | ICD-10-CM | POA: Diagnosis not present

## 2017-02-19 DIAGNOSIS — C7801 Secondary malignant neoplasm of right lung: Secondary | ICD-10-CM | POA: Insufficient documentation

## 2017-02-19 DIAGNOSIS — Z91041 Radiographic dye allergy status: Secondary | ICD-10-CM | POA: Diagnosis not present

## 2017-02-19 DIAGNOSIS — Z7982 Long term (current) use of aspirin: Secondary | ICD-10-CM | POA: Diagnosis not present

## 2017-02-19 DIAGNOSIS — C539 Malignant neoplasm of cervix uteri, unspecified: Secondary | ICD-10-CM | POA: Diagnosis present

## 2017-02-19 DIAGNOSIS — Z888 Allergy status to other drugs, medicaments and biological substances status: Secondary | ICD-10-CM | POA: Diagnosis not present

## 2017-02-19 NOTE — Progress Notes (Addendum)
Radiation Oncology         [phone] ________________________________  Name: Lori [name] MRN: [id number]  Date: 02/19/2017  DOB: 09/27/1951  Re-evaluation Note  CC: SHAH,ASHISH, MD  Zagar, Kris Hartmann, MD    ICD-9-CM ICD-10-CM   1. 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 She recurred in the periaortic region and received radiation therapy for management of this area.  Narrative:  The patient returns today for re-evaluation. The patient completed weekly Cisplatin from 06/22/15-07/26/15. She remained under follow up with medical oncology. The patient had a routine restaging PET scan on 08/11/16 that showed no hypermetabolic activity or evidence of metastasis. The most recent restaging PET scan was performed on 02/02/17. This demonstrated a new hypermetabolic 0.7 cm medial right upper lobe pulmonary nodule, SUV 2.5, and a slight growth of a 0.3 cm apical right upper lobe pulmonary nodule. These areas are suspicious for pulmonary metastasis.  She presents to the clinic today to determine the role of radiation therapy as part of her disease management.  The patient denies shortness of breath, lump in throat, difficulty swallowing, pain, hemoptysis, coughing, decreased appetite, or fatigue at this time.  She denies vaginal/ rectal bleeding, or diarrhea.                         ALLERGIES:  is allergic to contrast media [iodinated diagnostic agents]; dilantin  [phenytoin sodium extended]; latex; and adhesive [tape].  Meds: Current Outpatient Prescriptions  Medication Sig Dispense Refill  . amLODipine (NORVASC) 10 MG tablet Take 10 mg by mouth every morning.   5  . aspirin EC 81 MG tablet Take 81 mg by mouth daily.    . benazepril (LOTENSIN) 40 MG tablet Take 40 mg by mouth every morning.   5  . carbamazepine (TEGRETOL) 100 MG chewable tablet Chew 100 mg by mouth 3 (three) times daily.   5  . cholecalciferol (VITAMIN D) 1000 units tablet Take 1,000 Units by mouth daily. 2 tablets daily    . cloNIDine (CATAPRES) 0.2 MG tablet Take 0.2 mg by mouth 3 (three) times daily.   6  . gabapentin (NEURONTIN) 300 MG capsule Take 300 mg by mouth 2 (two) times daily.   5  . hydrochlorothiazide (HYDRODIURIL) 25 MG tablet Take 25 mg by mouth every morning.   11  . hydrocortisone cream 1 % Apply topically 2 (two) times daily.  1  . loperamide (IMODIUM A-D) 2 MG tablet Take 2 mg by mouth 4 (four) times daily as needed for diarrhea or loose stools. Reported on 03/28/2016    . magnesium oxide (MAG-OX) 400 (241.3 Mg) MG tablet Take 400 mg by mouth daily.     . metoprolol (LOPRESSOR) 50 MG tablet Take 25-50 mg by mouth 2 (two) times daily. Take half a tablet in the morning and half a tablet in the pm  5  . Multiple Vitamins-Minerals (MULTIVITAMIN WITH MINERALS) tablet Take 1 tablet by mouth daily.    Marland Kitchen  potassium chloride SA (K-DUR,KLOR-CON) 20 MEQ tablet Take 20 mEq by mouth 2 (two) times daily.   6  . pravastatin (PRAVACHOL) 20 MG tablet Take 20 mg by mouth every evening.   2   No current facility-administered medications for this encounter.     Physical Findings: The patient is in no acute distress. Patient is alert and oriented.  weight is 218 lb (98.9 kg). Her oral temperature is 98 F (36.7 C). Her blood pressure is 135/80 and her pulse is 54 (abnormal). Her respiration is 18 and oxygen saturation is 100%. .  No significant changes.  Lungs are clear to  auscultation bilaterally. Heart has regular rate and rhythm. No palpable cervical, supraclavicular, or axillary adenopathy. Abdomen soft, non-tender, normal bowel sounds.   Lab Findings: Lab Results  Component Value Date   WBC 4.6 02/07/2017   HGB 10.7 (L) 02/07/2017   HCT 32.1 (L) 02/07/2017   MCV 93.6 02/07/2017   PLT 184 02/07/2017    Radiographic Findings: Nm Pet Image Restag (ps) Skull Base To Thigh  Result Date: 02/02/2017 CLINICAL DATA:  Subsequent treatment strategy for FIGO stage IIB cervical cancer diagnosed May 2016 status post concurrent chemoradiation therapy completed 08/11/2015. Additional history of pT4a mucinous adenocarcinoma of the appendix diagnosed on May 2017 appendectomy. EXAM: NUCLEAR MEDICINE PET SKULL BASE TO THIGH TECHNIQUE: 10.6 mCi F-18 FDG was injected intravenously. Full-ring PET imaging was performed from the skull base to thigh after the radiotracer. CT data was obtained and used for attenuation correction and anatomic localization. FASTING BLOOD GLUCOSE:  Value: 128 mg/dl COMPARISON:  08/11/2016 PET-CT. FINDINGS: NECK No hypermetabolic lymph nodes in the neck. Stable postsurgical changes from left occipital craniectomy. CHEST Internal jugular MediPort terminates at the cavoatrial junction. Stable mild cardiomegaly. Stable small pericardial effusion/ thickening. Left main, left anterior descending, left circumflex and right coronary atherosclerosis. Atherosclerotic nonaneurysmal thoracic aorta. No hypermetabolic or enlarged axillary, mediastinal or hilar lymph nodes. No pleural effusions. No pneumothorax. New solid 0.7 cm medial right upper lobe pulmonary nodule (series 8/image 16) with associated metabolism with max SUV 2.5, considered significant for a nodule of this small size. Additional apical 3 mm solid right upper lobe pulmonary nodule (series 8/image 8), previously 2 mm on 08/11/2016 PET-CT and absent on 06/01/2015 PET-CT. No acute consolidative airspace  disease or additional significant pulmonary nodules. ABDOMEN/PELVIS No abnormal hypermetabolic activity within the liver, pancreas, adrenal glands, or spleen. No hypermetabolic lymph nodes in the abdomen or pelvis. Atherosclerotic nonaneurysmal abdominal aorta. No appreciable hypermetabolism or discrete mass in the cervix or appendectomy bed. SKELETON No focal hypermetabolic activity to suggest skeletal metastasis. IMPRESSION: 1. New hypermetabolic 0.7 cm medial right upper lobe pulmonary nodule. Slight growth of additional tiny 0.3 cm apical right upper lobe pulmonary nodule. Findings are suspicious for pulmonary metastases. 2. No additional sites of hypermetabolic metastatic disease. 3. No evidence of hypermetabolic recurrence in the cervix or appendectomy bed. 4. Additional findings include aortic atherosclerosis, left main and 3 vessel coronary atherosclerosis, stable mild cardiomegaly and stable small pericardial effusion/thickening. Electronically Signed   By: Ilona Sorrel M.D.   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 On recent PET scan, the patient has hypermetabolic nodule in the right upper lung and a very small nodule in the right lung apex which were not present at previous exams. These are concerning for oligometastasis from either cervical cancer or appendiceal cancer. Patient may be a candidate for SBRT treatment to these  lesions or hypofractionated radiation therapy.  Before proceeding with any treatment I would like to present her case to [facility] for further input to see if the larger lesion could potentially be biopsied. It may be of benefit to determine which cancer has recurred as this may guide potential systemic therapy in the future.  Plan:  I will discuss the patient's case at thoracic conference to make a decision on whether to proceed with treatment or potential biopsy.  ____________________________________    This  document serves as a record of services personally performed by Gery Pray, MD. It was created on his behalf by Bethann Humble, a trained medical scribe. The creation of this record is based on the scribe's personal observations and the provider's statements to them. This document has been checked and approved by the attending provider.

## 2017-03-09 ENCOUNTER — Ambulatory Visit
Admission: RE | Admit: 2017-03-09 | Discharge: 2017-03-09 | Disposition: A | Discharge status: Home / Self Care | Payer: Medicare Other | Source: Ambulatory Visit | Attending: Radiation Oncology | Admitting: Radiation Oncology

## 2017-03-09 DIAGNOSIS — C539 Malignant neoplasm of cervix uteri, unspecified: Secondary | ICD-10-CM

## 2017-03-09 NOTE — Progress Notes (Signed)
  Radiation Oncology         [phone] ________________________________  Name: Lori [name] MRN: [id number]  Date: 03/09/2017  DOB: 1951/08/10  RESPIRATORY MOTION MANAGEMENT SIMULATION  NARRATIVE:  In order to account for effect of respiratory motion on target structures and other organs in the planning and delivery of radiotherapy, this patient underwent respiratory motion management simulation.  To accomplish this, when the patient was brought to the CT simulation planning suite, 4D respiratoy motion management CT images were obtained.  The CT images were loaded into the planning software.  Then, using a variety of tools including Cine, MIP, and standard views, the target volume and planning target volumes (PTV) were delineated.  Avoidance structures were contoured.  Treatment planning then occurred.  Dose volume histograms were generated and reviewed for each of the requested structure.  The resulting plan was carefully reviewed and approved today. 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  Identity was confirmed.  All relevant records and images related to the planned course of therapy were reviewed.  The patient freely provided informed written consent to proceed with treatment after reviewing the details related to the planned course of therapy. The consent form was witnessed and verified by the simulation staff.  Then, the patient was set-up in a stable reproducible  supine position for radiation therapy.  A BodyFix immobilization pillow was fabricated for reproducible positioning.  Then I personally applied the abdominal compression paddle to  limit respiratory excursion.  4D respiratoy motion management CT images were obtained.  Surface markings were placed.  The CT images were loaded into the planning software.  Then, using Cine, MIP, and standard views, the internal target volume (ITV) and planning target volumes (PTV) were delinieated, and avoidance structures were contoured.  Treatment planning then occurred.  The radiation prescription was entered and confirmed.  A total of two complex treatment devices were fabricated in the form of the BodyFix immobilization pillow and a neck accuform cushion.  I have requested : 3D Simulation  I have requested a DVH of the following structures: Heart, Lungs, Esophagus, Chest Wall, Brachial Plexus, Major Blood Vessels, and targets.  PLAN:  The patient's 4D images were carefully reviewed. The patient's primary tumor was noted to be approximately 5-6 mm. This may be difficult to localize for SBRT. In light of this patient will not proceed with this treatment at this time but will undergo repeat imaging in 2 months with a repeat diagnostic CT scan. If this lesion or the smaller secondary lesion is larger than we'll consider SBRT treatment at that time. I discussed these findings with the patient and her son and both are in agreement.  -----------------------------------  Blair Promise, PhD, MD

## 2017-03-20 ENCOUNTER — Ambulatory Visit: Payer: Medicare Other | Admitting: Radiation Oncology

## 2017-03-21 ENCOUNTER — Ambulatory Visit: Payer: Medicare Other | Admitting: Radiation Oncology

## 2017-03-22 ENCOUNTER — Ambulatory Visit: Payer: Medicare Other | Admitting: Radiation Oncology

## 2017-03-23 ENCOUNTER — Ambulatory Visit: Payer: Medicare Other | Admitting: Radiation Oncology

## 2017-03-26 ENCOUNTER — Encounter (HOSPITAL_COMMUNITY): Payer: Self-pay | Admitting: Oncology

## 2017-03-26 ENCOUNTER — Ambulatory Visit: Payer: Medicare Other | Admitting: Radiation Oncology

## 2017-03-26 ENCOUNTER — Encounter (HOSPITAL_COMMUNITY): Payer: Medicare Other

## 2017-03-26 ENCOUNTER — Encounter (HOSPITAL_COMMUNITY): Payer: Medicare Other | Attending: Oncology | Admitting: Oncology

## 2017-03-26 VITALS — BP 134/80 | HR 57 | Temp 98.0°F | Resp 16 | Ht 67.5 in | Wt 219.0 lb

## 2017-03-26 DIAGNOSIS — R911 Solitary pulmonary nodule: Secondary | ICD-10-CM | POA: Insufficient documentation

## 2017-03-26 DIAGNOSIS — C181 Malignant neoplasm of appendix: Secondary | ICD-10-CM | POA: Diagnosis not present

## 2017-03-26 DIAGNOSIS — C539 Malignant neoplasm of cervix uteri, unspecified: Secondary | ICD-10-CM

## 2017-03-26 DIAGNOSIS — Z95828 Presence of other vascular implants and grafts: Secondary | ICD-10-CM | POA: Insufficient documentation

## 2017-03-26 HISTORY — DX: Solitary pulmonary nodule: R91.1

## 2017-03-26 MED ORDER — SODIUM CHLORIDE 0.9% FLUSH
10.0000 mL | INTRAVENOUS | Status: AC | PRN
  Administered 2017-03-26: 10 mL via INTRAVENOUS
  Filled 2017-03-26: qty 10

## 2017-03-26 MED ORDER — HEPARIN SOD (PORK) LOCK FLUSH 100 UNIT/ML IV SOLN
INTRAVENOUS | Status: AC
  Filled 2017-03-26: qty 5

## 2017-03-26 MED ORDER — HEPARIN SOD (PORK) LOCK FLUSH 100 UNIT/ML IV SOLN
500.0000 [IU] | Freq: Once | INTRAVENOUS | Status: AC
  Administered 2017-03-26: 500 [IU] via INTRAVENOUS

## 2017-03-26 NOTE — Assessment & Plan Note (Signed)
Stage IIB endocervical adenocarcinoma, S/P primary chemoradiation in 2016.

## 2017-03-26 NOTE — Progress Notes (Signed)
Lori [name] presented for Portacath access and flush.  Portacath located right chest wall accessed with  H 20 needle.  Good blood return present. Portacath flushed with 21ml NS and 500U/11ml Heparin and needle removed intact.  Procedure tolerated well and without incident.  Pt stable and discharged home ambulatory.

## 2017-03-26 NOTE — Assessment & Plan Note (Addendum)
Locally advanced appendiceal mucinous adenocarcinoma (pT4NX), S/P appendectomy on 04/19/2016 by Dr. Rolanda Jay.  Patient's case was discussed in GI tumor board in [place] and adjuvant systemic chemotherapy was not recommended.  Oncology history is updated.  No role for labs today from an oncology perspective.  Port flush today and every 6 weeks.  Problem list reviewed with patient and edited accordingly.  Medications are reviewed with the patient and edited accordingly.  She plans on transitioning her care to [place] medical oncology when they open.  As a result, she may call us and cancel her follow-up appointment in 3 months.  Return in 12 weeks for follow-up. We will be on the look-out for XRT recommendations following her next CT imaging in June 2018.  More than 50% of the time spent with the patient was utilized for counseling and coordination of care.

## 2017-03-26 NOTE — Patient Instructions (Addendum)
[facility] at [facility] Discharge Instructions  RECOMMENDATIONS MADE BY THE CONSULTANT AND ANY TEST RESULTS WILL BE SENT TO YOUR REFERRING PHYSICIAN.  You were seen today by Kirby Crigler PA-C. Return in 3 months for follow up.    Thank you for choosing [facility] at [facility] to provide your oncology and hematology care.  To afford each patient quality time with our provider, please arrive at least 15 minutes before your scheduled appointment time.    If you have a lab appointment with the [facility] please come in thru the  Main Entrance and check in at the main information desk  You need to re-schedule your appointment should you arrive 10 or more minutes late.  We strive to give you quality time with our providers, and arriving late affects you and other patients whose appointments are after yours.  Also, if you no show three or more times for appointments you may be dismissed from the clinic at the providers discretion.     Again, thank you for choosing [facility].  Our hope is that these requests will decrease the amount of time that you wait before being seen by our physicians.       _____________________________________________________________  Should you have questions after your visit to [facility], please contact our office at [phone] between the hours of 8:30 a.m. and 4:30 p.m.  Voicemails left after 4:30 p.m. will not be returned until the following business day.  For prescription refill requests, have your pharmacy contact our office.       Resources For Cancer Patients and their Caregivers ? American Cancer Society: Can assist with transportation, wigs, general needs, runs Look Good Feel Better.        8572985487 ? Cancer Care: Provides financial assistance, online support groups, medication/co-pay assistance.  1-800-813-HOPE (858)180-1017) ? [facility]ists  [place] cancer patients and their families through emotional , educational and financial support.  562-248-4990 ? [place] DSS Where to apply for food stamps, Medicaid and utility assistance. 332-849-0711 ? [facility]: Transportation to medical appointments. 7378187522 ? Social Security Administration: May apply for disability if have a Stage IV cancer. 408-437-0099 548-121-3709 ? [employer], Disability and Transit Services: Assists with nutrition, care and transit needs. [place] Support Programs: @10RELATIVEDAYS @ > Cancer Support Group  2nd Tuesday of the month 1pm-2pm, Journey Room  > Creative Journey  3rd Tuesday of the month 1130am-1pm, Journey Room  > Look Good Feel Better  1st Wednesday of the month 10am-12 noon, Journey Room (Call [facility] to register [phone])

## 2017-03-26 NOTE — Progress Notes (Signed)
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  INTERVAL HISTORY: Lori [name] 66 y.o. female returns for followup of locally advanced appendiceal mucinous adenocarcinoma (pT4NX), S/P appendectomy on 04/19/2016 by Dr. Rolanda Jay. AND Stage IIB endocervical adenocarcinoma, S/P primary chemoradiation in 2016.  Oncology History   1. 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 Positive para-aortic LN on PET s/p XRT 3. Locally advance appendiceal mucinous adenocarcinoma pT4Nx s/p appendectomy     Carcinoma of appendix ([place])   04/05/2015 Cancer Diagnosis    Stage IIB adenocarinoma of Cervix, HPV positive      06/21/2015 - 08/11/2015 Radiation Therapy    XRT with concurrent cisplatin, brachytherapy with Dr. Sondra Come      06/22/2015 - 07/26/2015 Chemotherapy    Weekly Cisplatin      01/12/2016 PET scan    Single hypermetabolic retroperitoneal L periaortic LN worrisome for metastatic disease      04/04/2016 PET scan    Interval decrease in FDG uptake with retroperitoneal LN, unchanged appearance of distal appendix which appears thickened and exhibits mild increased uptake      04/19/2016 Surgery    Laparoscopic Appendectomy with Dr. Josue Hector. [place]      04/19/2016 Pathology Results    Mucinous adenocarcinoma extend into periappend connect tissue and serosal surface, 2.2 cm in size, grade 2, +PNI pT4a,pNX      04/19/2016 Cancer Diagnosis    pT4a,pNx Mucinous adenocarcinoma of the appendix      08/11/2016 PET scan    No evidence of hypermetabolic appendiceal carcinoma recurrence or metastasis in the abdomen or pelvis. 2. Metabolic activity at the level the cervix difficult to assess as adjacent to bladder activity. 3. No  hypermetabolic lymph nodes in the abdomen or pelvis. No distant disease.      02/02/2017 PET scan    1. New hypermetabolic 0.7 cm medial right upper lobe pulmonary nodule. Slight growth of additional tiny 0.3 cm apical right upper lobe pulmonary nodule. Findings are suspicious for pulmonary metastases. 2. No additional sites of hypermetabolic metastatic disease. 3. No evidence of hypermetabolic recurrence in the cervix or appendectomy bed. 4. Additional findings include aortic atherosclerosis, left main and 3 vessel coronary atherosclerosis, stable mild cardiomegaly and stable small pericardial effusion/thickening.       She is doing well.  She notes a good appetite.  She denies any cough that is abnormal.  She denies any hemoptysis.  Weight is stable.  I reviewed the plan from a radiation oncology standpoint and she is in agreement.  She notes that she is going to be scheduled for CT imaging in June 2018.  She feels good.  She recently learned that [facility], [place] [facility] will be opening soon.  She is interested in transitioning her medical oncology care to that facility once they are open due to her transportation issues.  We are happy to facilitate that if needed.  Review of Systems  Constitutional: Negative.  Negative for chills, fever and weight loss.  HENT: Negative.   Eyes: Negative.   Respiratory: Negative.  Negative for cough.   Cardiovascular: Negative.  Negative for chest pain.  Gastrointestinal: Negative.  Negative for blood in stool, constipation, diarrhea, melena, nausea and vomiting.  Genitourinary: Negative.   Musculoskeletal: Negative.  Skin: Negative.   Neurological: Negative.  Negative for weakness.  Endo/Heme/Allergies: Negative.   Psychiatric/Behavioral: Negative.     Past Medical History:  Diagnosis Date  . Anemia 2017   3u blood 2017  . Arthritis    KNEES  . Carcinoma of appendix ([place]) 04/11/2016   Patient with a history of cervical cancer status  post chemoradiation with a persistent finding of a dilated appendix with hypermetabolism. This is raised a concern of malignancy. We'll plan to move forward with laparoscopic possible open appendectomy to rule out malignancy.   . Cervical cancer, FIGO stage IIB ([place]) 05/24/2015  . Cervical carcinoma [facility]) oncologist--  dr Sondra Come for radiation/   [facility] for chemotherapy   endocervical adenocarcinoma w/  Nodal METS--  FIGO Stage IIB  . Depression   . Endometrial carcinoma [facility])    goes to [facility] in [place] for chemotherapy  . GERD (gastroesophageal reflux disease)   . Heart murmur   . History of blood transfusion   . 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 History of radiation therapy 07/28/15-08/26/15   cervical region 27.5 gray  . History of seizures    post-op craniotomy for aneurysm 1997-- controlled w/ medication since then no seizures  . Hyperlipidemia   . Hypertension   . PMB (postmenopausal bleeding)   . Pulmonary nodule 03/26/2017  . Seizures ([place])    hx of years ago   . Type 2 diabetes, diet controlled ([place])    diet controlled     Past Surgical History:  Procedure Laterality Date  . COLONOSCOPY    . CRANIOTOMY POSTERIOR FOSSA  1997   Repair aneurysm  x2  left side  . LAPAROSCOPIC APPENDECTOMY N/A 04/19/2016   Procedure: APPENDECTOMY LAPAROSCOPIC;  Surgeon: Hall Busing, MD;  Location: [facility];  Service: General;  Laterality: N/A;  . PORT-A-CATH PLACEMENT  06/  2016  . TANDEM RING INSERTION N/A 07/28/2015   Procedure: TANDEM RING PLACEMENT;  Surgeon: Gery Pray, MD;  Location: [facility];  Service: Urology;  Laterality: N/A;  . TANDEM RING INSERTION N/A 08/05/2015   Procedure: TANDEM RING PLACEMENT ;  Surgeon: Gery Pray, MD;  Location: [facility];  Service: Urology;  Laterality: N/A;  . TANDEM RING INSERTION N/A 08/11/2015    Procedure: TANDEM RING PLACEMENT ;  Surgeon: Gery Pray, MD;  Location: [facility];  Service: Urology;  Laterality: N/A;  . TANDEM RING INSERTION N/A 08/17/2015   Procedure: TANDEM RING PLACEMENT ;  Surgeon: Gery Pray, MD;  Location: [facility];  Service: Urology;  Laterality: N/A;  . TANDEM RING INSERTION N/A 08/26/2015   Procedure: TANDEM RING PLACEMENT ;  Surgeon: Gery Pray, MD;  Location: [facility];  Service: Urology;  Laterality: N/A;    Family History  Problem Relation Age of Onset  . Throat cancer Father   . Cervical cancer Sister     Social History   Social History  . Marital status: Single    Spouse name: N/A  . Number of children: N/A  . Years of education: N/A   Social History Main Topics  . Smoking status: Former Smoker    Packs/day: 0.25    Years: 5.00    Types: Cigarettes    Quit date: 12/05/1995  . Smokeless tobacco: Never Used  . Alcohol use No  . Drug use: No  .  Sexual activity: Not Asked   Other Topics Concern  . None   Social History Narrative  . None     PHYSICAL EXAMINATION  ECOG PERFORMANCE STATUS: 1 - Symptomatic but completely ambulatory  Vitals:   03/26/17 1014  BP: 134/80  Pulse: (!) 57  Resp: 16  Temp: 98 F (36.7 C)    GENERAL:alert, no distress, well nourished, well developed, comfortable, cooperative, smiling and unaccompanied SKIN: skin color, texture, turgor are normal HEAD: Normocephalic, No masses, lesions, tenderness or abnormalities EYES: normal, EOMI, Conjunctiva are pink and non-injected EARS: External ears normal OROPHARYNX:lips, buccal mucosa, and tongue normal and mucous membranes are moist, poor dentition. NECK: supple, no adenopathy, trachea midline, posterior left neck surgical scar noted. LYMPH:  no palpable lymphadenopathy, no hepatosplenomegaly BREAST:not examined LUNGS: clear to auscultation and percussion HEART: regular rate & rhythm, no murmurs, no  gallops, S1 normal and S2 normal ABDOMEN:abdomen soft, non-tender, normal bowel sounds and no masses or organomegaly BACK: Back symmetric, no curvature. EXTREMITIES:less then 2 second capillary refill, no joint deformities, effusion, or inflammation, no skin discoloration, no cyanosis  NEURO: alert & oriented x 3 with fluent speech, no focal motor/sensory deficits, gait normal with cane to assist with balance.   LABORATORY DATA: CBC    Component Value Date/Time   WBC 4.6 02/07/2017 1219   RBC 3.43 (L) 02/07/2017 1219   HGB 10.7 (L) 02/07/2017 1219   HCT 32.1 (L) 02/07/2017 1219   PLT 184 02/07/2017 1219   MCV 93.6 02/07/2017 1219   MCH 31.2 02/07/2017 1219   MCHC 33.3 02/07/2017 1219   RDW 14.3 02/07/2017 1219   LYMPHSABS 0.6 (L) 02/07/2017 1219   MONOABS 0.4 02/07/2017 1219   EOSABS 0.1 02/07/2017 1219   BASOSABS 0.0 02/07/2017 1219      Chemistry      Component Value Date/Time   NA 134 (L) 02/07/2017 1219   K 3.4 (L) 02/07/2017 1219   CL 97 (L) 02/07/2017 1219   CO2 31 02/07/2017 1219   BUN 17 02/07/2017 1219   CREATININE 1.06 (H) 02/07/2017 1219      Component Value Date/Time   CALCIUM 9.0 02/07/2017 1219   ALKPHOS 124 02/07/2017 1219   AST 18 02/07/2017 1219   ALT 15 02/07/2017 1219   BILITOT 0.5 02/07/2017 1219        PENDING LABS:   RADIOGRAPHIC STUDIES:  No results found.   PATHOLOGY:    ASSESSMENT AND PLAN:  Carcinoma of appendix ([place]) Locally advanced appendiceal mucinous adenocarcinoma (pT4NX), S/P appendectomy on 04/19/2016 by Dr. Rolanda Jay.  Patient's case was discussed in GI tumor board in [place] and adjuvant systemic chemotherapy was not recommended.  Oncology history is updated.  No role for labs today from an oncology perspective.  Port flush today and every 6 weeks.  Problem list reviewed with patient and edited accordingly.  Medications are reviewed with the patient and edited accordingly.  She plans on transitioning her care to [place], [place]  medical oncology when they open.  As a result, she may call us and cancel her follow-up appointment in 3 months.  Return in 12 weeks for follow-up. We will be on the look-out for XRT recommendations following her next CT imaging in June 2018.  More than 50% of the time spent with the patient was utilized for counseling and coordination of care.   Cervical cancer, FIGO stage IIB ([facility]) Stage IIB endocervical adenocarcinoma, S/P primary chemoradiation in 2016.  Pulmonary nodule New pulmonary nodules on PET imaging  on 02/02/2017 measuring 7 mm in RUL and 3 mm RUL.  7 mm nodule is hypermetabolic.  3 mm nodule has grown in size.  She has been evaluated by XRT in [place], Dr. Sondra Come.  Her case has been discussed at thoracic tumor board in [place].  Recommendation from tumor board outlined in Dr. Clabe Seal last note on 03/09/2017: PLAN:  The patient's 4D images were carefully reviewed. The patient's primary tumor was noted to be approximately 5-6 mm. This may be difficult to localize for SBRT. In light of this patient will not proceed with this treatment at this time but will undergo repeat imaging in 2 months with a repeat diagnostic CT scan. If this lesion or the smaller secondary lesion is larger than we'll consider SBRT treatment at that time. I discussed these findings with the patient and her son and both are in agreement.  Dr. Sondra Come has ordered follow-up CT imaging.   ORDERS PLACED FOR THIS ENCOUNTER: No orders of the defined types were placed in this encounter.   MEDICATIONS PRESCRIBED THIS ENCOUNTER: No orders of the defined types were placed in this encounter.   THERAPY PLAN:  Repeat CT chest as planned by XRT in June 2018.    All questions were answered. The patient knows to call the clinic with any problems, questions or concerns. We can certainly see the patient much sooner if necessary.  Patient and plan discussed with Dr. Twana First and she is in agreement with the aforementioned.    This note is electronically signed by: Doy Mince 03/26/2017 10:28 AM

## 2017-03-26 NOTE — Assessment & Plan Note (Addendum)
New pulmonary nodules on PET imaging on 02/02/2017 measuring 7 mm in RUL and 3 mm RUL.  7 mm nodule is hypermetabolic.  3 mm nodule has grown in size.  She has been evaluated by X[place] in [place], Dr. Sondra Come.  Her case has been discussed at thoracic tumor board in [place].  Recommendation from tumor board outlined in Dr. Clabe Seal last note on 03/09/2017: PLAN:  The patient's 4D images were carefully reviewed. The patient's primary tumor was noted to be approximately 5-6 mm. This may be difficult to localize for SBRT. In light of this patient will not proceed with this treatment at this time but will undergo repeat imaging in 2 months with a repeat diagnostic CT scan. If this lesion or the smaller secondary lesion is larger than we'll consider SBRT treatment at that time. I discussed these findings with the patient and her son and both are in agreement.  Dr. Sondra Come has ordered follow-up CT imaging.

## 2017-03-26 NOTE — Addendum Note (Signed)
Addended by: Farley Ly on: 03/26/2017 11:06 AM   Modules accepted: Orders, SmartSet

## 2017-03-28 ENCOUNTER — Ambulatory Visit: Payer: Medicare Other | Admitting: Radiation Oncology

## 2017-03-30 ENCOUNTER — Ambulatory Visit: Payer: Medicare Other | Admitting: Radiation Oncology

## 2017-04-05 ENCOUNTER — Encounter (HOSPITAL_COMMUNITY): Payer: Medicare Other

## 2017-04-06 ENCOUNTER — Telehealth: Payer: Self-pay | Admitting: *Deleted

## 2017-04-06 NOTE — Telephone Encounter (Signed)
I have called and left a message for the patient to the office back. Need to move her May 23rd appt to May 25th.

## 2017-04-09 ENCOUNTER — Telehealth: Payer: Self-pay | Admitting: *Deleted

## 2017-04-09 NOTE — Telephone Encounter (Signed)
I have called and moved the patient's appt from May 23rd to May 25th. Patient aware of new date/time.

## 2017-04-25 ENCOUNTER — Ambulatory Visit: Payer: Medicare Other | Admitting: Gynecologic Oncology

## 2017-04-27 ENCOUNTER — Encounter: Payer: Self-pay | Admitting: Gynecologic Oncology

## 2017-04-27 ENCOUNTER — Ambulatory Visit: Payer: Medicare Other | Attending: Gynecologic Oncology | Admitting: Gynecologic Oncology

## 2017-04-27 VITALS — BP 139/81 | HR 58 | Temp 97.8°F | Resp 20 | Wt 216.0 lb

## 2017-04-27 DIAGNOSIS — Z8541 Personal history of malignant neoplasm of cervix uteri: Secondary | ICD-10-CM

## 2017-04-27 DIAGNOSIS — C539 Malignant neoplasm of cervix uteri, unspecified: Secondary | ICD-10-CM

## 2017-04-27 DIAGNOSIS — R918 Other nonspecific abnormal finding of lung field: Secondary | ICD-10-CM

## 2017-04-27 DIAGNOSIS — C530 Malignant neoplasm of endocervix: Secondary | ICD-10-CM | POA: Insufficient documentation

## 2017-04-27 NOTE — Progress Notes (Signed)
Follow-up Note: Gyn-Onc   Lori [name] 66 y.o. female  CC:  Chief Complaint  Patient presents with  . Endocervical Cancer  New diagnosis of appendiceal cancer.  Assessment/Plan:  [name]  is a 66 y.o.  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   Complete clinical response on PET of 08/11/16.  Question of possible recurrence in right upper lobe lung. Stable on repeat imaging therefore no therapy interventions yet. If shows growith/progression, would consider salvage chemotherapy vs localized XRT.  Plan - repeat chest CT in June, 2018. Desires transfer of care to [facility] due to convenience. Will facilitate this.  I will see her back for cervical cancer survellance in November, 2018.   HPI: Lori [name] is a 66 year old woman is seen in consultation at the request of Dr. Adah Perl for postmenopausal bleeding and adenocarcinoma.   The patient reports having postmenopausal bleeding sent March 2016. She was evaluated with a Pap smear in April 2016 which revealed ASC-H. she then presented for colposcopic evaluation at which time an abnormal cervix was visualized. An endometrial biopsy was taken and this revealed adenocarcinoma with special stains consistent with an endocervical primary (CEA and P 16 positivity). She underwent a CT scan of the abdomen and pelvis on 04/09/2015 and this revealed no lymphadenopathy, mild low attenuation thickening of the endometrium, no gross metastatic disease.  She underwent PET/CT on 05/31/16 which showed intense cervical activity, a clinically positive left external iliac node and intense activity at the distal appendix with a thickened appendiceal wall (54mm) in this region.  She received definitive chemoradiation with external beam and brachytherapy and weekly cddp at [facility] with Dr's Kinard  and Neijstrom. Treatment was completed on 08/26/15 without treatment delays.  Post-treatment PET CT on 01/12/16 showed complete response in the pelvic and pelvic nodal regions but a new Ill-defined adenopathy in the left periaortic station measures approximately 12 mm with an SUV max of 6.3. No additional hypermetabolic lymph nodes The appendiceal lesion continued to be PET positive.  The para-aortic node received definitive radiation with Dr Sondra Come in [place] until March, 2017. Post-treatment repeat PET CT on 04/04/16 showed persistent increased avidity in the appendix but complete response at the PA nodal region.  On 04/19/16 Dr Rolanda Jay took the patient to the operating room for a laparoscopic appendectomy with final pathology revealing a mucinous adenocarcinoma extending into periappendiceal connective tissue and serosal surface of tumor. The proximal GI margin was free of tumor. The mesenteric margin was involved by tumor. There was perineural invasion present. It was staged as T4a. Comparison to her original cervical cancer revealed it to be a separate primary tumor.  After extensive discussion, it was determined that expectant management would be the best course of management for this patient as there was considerable concern about re-operating to perform hemicolectomy given her history of prior abdominal radiation.  Interval Hx:  She has done well and is feeling well. She has no pain or bleeding.  PET on 08/11/16 showed no hypermetabolic nodes and no evidence of recurrence of appendiceal tumor. PET 02/02/17 RUL 0.7cm medial right upper lobe pulmonary nodule - new. She was evaluated by Dr Sondra Come in [place] and her 4D images were carefully reviewed. It was felt that the lesion was approximately 5-69mm and therefore, too small to localize for SBRT. Therefore plan was made to repeat imaging n June, 2018.   Current Meds:  Outpatient  Encounter Prescriptions as of 04/27/2017  Medication Sig  . amLODipine  (NORVASC) 10 MG tablet Take 10 mg by mouth every morning.   Marland Kitchen aspirin EC 81 MG tablet Take 81 mg by mouth daily.  . benazepril (LOTENSIN) 40 MG tablet Take 40 mg by mouth every morning.   . carbamazepine (TEGRETOL) 100 MG chewable tablet Chew 100 mg by mouth 3 (three) times daily.   . cholecalciferol (VITAMIN D) 1000 units tablet Take 1,000 Units by mouth daily. 2 tablets daily  . cloNIDine (CATAPRES) 0.2 MG tablet Take 0.2 mg by mouth 3 (three) times daily.   Marland Kitchen gabapentin (NEURONTIN) 300 MG capsule Take 300 mg by mouth 2 (two) times daily.   . hydrochlorothiazide (HYDRODIURIL) 25 MG tablet Take 25 mg by mouth every morning.   . hydrocortisone cream 1 % Apply topically 2 (two) times daily.  Marland Kitchen loperamide (IMODIUM A-D) 2 MG tablet Take 2 mg by mouth 4 (four) times daily as needed for diarrhea or loose stools. Reported on 03/28/2016  . magnesium oxide (MAG-OX) 400 (241.3 Mg) MG tablet Take 400 mg by mouth daily.   . metoprolol (LOPRESSOR) 50 MG tablet Take 25-50 mg by mouth 2 (two) times daily. Take half a tablet in the morning and half a tablet in the pm  . Multiple Vitamins-Minerals (MULTIVITAMIN WITH MINERALS) tablet Take 1 tablet by mouth daily.  . potassium chloride SA (K-DUR,KLOR-CON) 20 MEQ tablet Take 20 mEq by mouth 2 (two) times daily.   . pravastatin (PRAVACHOL) 20 MG tablet Take 20 mg by mouth every evening.    Facility-Administered Encounter Medications as of 04/27/2017  Medication  . sodium chloride flush (NS) 0.9 % injection 10 mL    Allergy:  Allergies  Allergen Reactions  . Contrast Media [Iodinated Diagnostic Agents] Itching and Swelling  . Dilantin [Phenytoin Sodium Extended] Itching, Swelling and Other (See Comments)    Whole body  . Latex Hives and Itching  . Adhesive [Tape] Rash    Social Hx:   Social History   Social History  . Marital status: Single    Spouse name: N/A  . Number of children: N/A  . Years of education: N/A   Occupational History  . Not on  file.   Social History Main Topics  . Smoking status: Former Smoker    Packs/day: 0.25    Years: 5.00    Types: Cigarettes    Quit date: 12/05/1995  . Smokeless tobacco: Never Used  . Alcohol use No  . Drug use: No  . Sexual activity: Not on file   Other Topics Concern  . Not on file   Social History Narrative  . No narrative on file    Past Surgical Hx:  Past Surgical History:  Procedure Laterality Date  . COLONOSCOPY    . CRANIOTOMY POSTERIOR FOSSA  1997   Repair aneurysm  x2  left side  . LAPAROSCOPIC APPENDECTOMY N/A 04/19/2016   Procedure: APPENDECTOMY LAPAROSCOPIC;  Surgeon: Hall Busing, MD;  Location: [facility];  Service: General;  Laterality: N/A;  . PORT-A-CATH PLACEMENT  06/  2016  . TANDEM RING INSERTION N/A 07/28/2015   Procedure: TANDEM RING PLACEMENT;  Surgeon: Gery Pray, MD;  Location[facility];  Service: Urology;  Laterality: N/A;  . TANDEM RING INSERTION N/A 08/05/2015   Procedure: TANDEM RING PLACEMENT ;  Surgeon: Gery Pray, MD;  Location: [facility];  Service: Urology;  Laterality: N/A;  . TANDEM RING INSERTION N/A 08/11/2015  Procedure: TANDEM RING PLACEMENT ;  Surgeon: Gery Pray, MD;  Location: [facility];  Service: Urology;  Laterality: N/A;  . TANDEM RING INSERTION N/A 08/17/2015   Procedure: TANDEM RING PLACEMENT ;  Surgeon: Gery Pray, MD;  [facility];  Service: Urology;  Laterality: N/A;  . TANDEM RING INSERTION N/A 08/26/2015   Procedure: TANDEM RING PLACEMENT ;  Surgeon: Gery Pray, MD;  Location: [facility];  Service: Urology;  Laterality: N/A;    Past Medical Hx:  Past Medical History:  Diagnosis Date  . Anemia 2017   3u blood 2017  . Arthritis    KNEES  . Carcinoma of appendix ([place]) 04/11/2016   Patient with a history of cervical cancer status post chemoradiation with a persistent finding of a dilated appendix with hypermetabolism. This is  raised a concern of malignancy. We'll plan to move forward with laparoscopic possible open appendectomy to rule out malignancy.   . Cervical cancer, FIGO stage IIB ([place]) 05/24/2015  . Cervical carcinoma [facility]) oncologist--  dr Sondra Come for radiation/   [facility] for chemotherapy   endocervical adenocarcinoma w/  Nodal METS--  FIGO Stage IIB  . Depression   . Endometrial carcinoma [facility])    goes to [facility] in [place] for chemotherapy  . GERD (gastroesophageal reflux disease)   . Heart murmur   . History of blood transfusion   . 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 History of radiation therapy 07/28/15-08/26/15   cervical region 27.5 gray  . History of seizures    post-op craniotomy for aneurysm 1997-- controlled w/ medication since then no seizures  . Hyperlipidemia   . Hypertension   . PMB (postmenopausal bleeding)   . Pulmonary nodule 03/26/2017  . Seizures ([place])    hx of years ago   . Type 2 diabetes, diet controlled ([place])    diet controlled     Past Gynecological History:  SVD x 3  No LMP recorded. Patient is postmenopausal.  Family Hx:  Family History  Problem Relation Age of Onset  . Throat cancer Father   . Cervical cancer Sister     Review of Systems:  Constitutional  Feels well,    ENT Normal appearing ears and nares bilaterally Skin/Breast  No rash, sores, jaundice, itching, dryness Cardiovascular  No chest pain, shortness of breath, or edema  Pulmonary  No cough or wheeze.  Gastro Intestinal  No nausea, vomitting, or diarrhoea. No bright red blood per rectum, no abdominal pain, change in bowel movement, or constipation.  Genito Urinary  No frequency, urgency, dysuria, see HPI Musculo Skeletal  No myalgia, arthralgia, joint swelling or pain  Neurologic  No weakness, numbness, change in gait,  Psychology  No depression, anxiety, insomnia.   Vitals:   Blood pressure 139/81, pulse (!) 58, temperature 97.8 F (36.6 C), resp. rate 20, weight 216 lb (98 kg).  Physical Exam: WD in NAD Neck  Supple NROM, without any enlargements.  Lymph Node Survey No cervical supraclavicular or inguinal adenopathy Cardiovascular  Pulse normal rate, regularity and rhythm. S1 and S2 normal.  Lungs  Clear to auscultation bilateraly, without wheezes/crackles/rhonchi. Good air movement.  Skin  No rash/lesions/breakdown  Psychiatry  Alert and oriented to person, place, and time  Abdomen  Normoactive bowel sounds, abdomen soft, non-tender and overweight without evidence of hernia. Well healed laparoscopic incisions. Back No  CVA tenderness Genito Urinary  Vulva/vagina: Normal external female genitalia.  No lesions. No discharge or bleeding.  Bladder/urethra:  No lesions or masses, well supported bladder  Vagina: Grossly normal with radiation changes and loss of rugation.  Cervix: Cervix flush with upper vagina. Some agglutination and narrowing of upper vagina.  Uterus: Small, mobile, no parametrial involvement or nodularity.  Adnexa: no discrete masses. Rectal  Good tone, no masses no cul de sac nodularity.  Extremities  No bilateral cyanosis, clubbing or edema.   Donaciano Eva, MD   04/27/2017, 2:15 PM

## 2017-04-27 NOTE — Patient Instructions (Signed)
We will work on your transfer to Dr. Tasia Catchings at [place].  Plan to have a CT scan chest in June 2018 and follow up with Dr. Denman George in November 2018 or sooner if needed.  Please call for any questions or concerns.

## 2017-05-01 ENCOUNTER — Telehealth: Payer: Self-pay | Admitting: *Deleted

## 2017-05-01 NOTE — Telephone Encounter (Signed)
CALLED PATIENT TO INFORM OF CT FOR 05-08-17- ARRIVAL TIME - 11:45 AM , NO RESTRICTIONS, TEST TO BE @ [facility], LVM FOR A RETURN CALL

## 2017-05-07 ENCOUNTER — Encounter (HOSPITAL_COMMUNITY): Payer: Medicare Other

## 2017-05-08 ENCOUNTER — Ambulatory Visit (HOSPITAL_COMMUNITY): Admission: RE | Admit: 2017-05-08 | Payer: Medicare Other | Source: Ambulatory Visit

## 2017-05-14 ENCOUNTER — Encounter (HOSPITAL_COMMUNITY): Payer: Self-pay | Admitting: Emergency Medicine

## 2017-05-14 ENCOUNTER — Inpatient Hospital Stay (HOSPITAL_COMMUNITY)
Admission: EM | Admit: 2017-05-14 | Discharge: 2017-05-19 | DRG: 389 | Disposition: A | Discharge status: Home / Self Care | Payer: Medicare Other | Attending: Internal Medicine | Admitting: Internal Medicine

## 2017-05-14 DIAGNOSIS — Z8049 Family history of malignant neoplasm of other genital organs: Secondary | ICD-10-CM

## 2017-05-14 DIAGNOSIS — R911 Solitary pulmonary nodule: Secondary | ICD-10-CM | POA: Diagnosis present

## 2017-05-14 DIAGNOSIS — Z8542 Personal history of malignant neoplasm of other parts of uterus: Secondary | ICD-10-CM

## 2017-05-14 DIAGNOSIS — Z85038 Personal history of other malignant neoplasm of large intestine: Secondary | ICD-10-CM

## 2017-05-14 DIAGNOSIS — K56699 Other intestinal obstruction unspecified as to partial versus complete obstruction: Secondary | ICD-10-CM | POA: Diagnosis not present

## 2017-05-14 DIAGNOSIS — Z91041 Radiographic dye allergy status: Secondary | ICD-10-CM

## 2017-05-14 DIAGNOSIS — Z87891 Personal history of nicotine dependence: Secondary | ICD-10-CM

## 2017-05-14 DIAGNOSIS — Z4659 Encounter for fitting and adjustment of other gastrointestinal appliance and device: Secondary | ICD-10-CM

## 2017-05-14 DIAGNOSIS — E876 Hypokalemia: Secondary | ICD-10-CM | POA: Diagnosis present

## 2017-05-14 DIAGNOSIS — Z8541 Personal history of malignant neoplasm of cervix uteri: Secondary | ICD-10-CM

## 2017-05-14 DIAGNOSIS — Z923 Personal history of irradiation: Secondary | ICD-10-CM

## 2017-05-14 DIAGNOSIS — E785 Hyperlipidemia, unspecified: Secondary | ICD-10-CM | POA: Diagnosis present

## 2017-05-14 DIAGNOSIS — I10 Essential (primary) hypertension: Secondary | ICD-10-CM | POA: Diagnosis present

## 2017-05-14 DIAGNOSIS — K56609 Unspecified intestinal obstruction, unspecified as to partial versus complete obstruction: Secondary | ICD-10-CM | POA: Diagnosis present

## 2017-05-14 DIAGNOSIS — Z23 Encounter for immunization: Secondary | ICD-10-CM

## 2017-05-14 DIAGNOSIS — Z91040 Latex allergy status: Secondary | ICD-10-CM

## 2017-05-14 DIAGNOSIS — Z79899 Other long term (current) drug therapy: Secondary | ICD-10-CM

## 2017-05-14 DIAGNOSIS — Z7982 Long term (current) use of aspirin: Secondary | ICD-10-CM

## 2017-05-14 DIAGNOSIS — C539 Malignant neoplasm of cervix uteri, unspecified: Secondary | ICD-10-CM | POA: Diagnosis present

## 2017-05-14 DIAGNOSIS — C181 Malignant neoplasm of appendix: Secondary | ICD-10-CM | POA: Diagnosis present

## 2017-05-14 DIAGNOSIS — Z9049 Acquired absence of other specified parts of digestive tract: Secondary | ICD-10-CM

## 2017-05-14 DIAGNOSIS — E119 Type 2 diabetes mellitus without complications: Secondary | ICD-10-CM | POA: Diagnosis present

## 2017-05-14 DIAGNOSIS — Z9221 Personal history of antineoplastic chemotherapy: Secondary | ICD-10-CM

## 2017-05-14 DIAGNOSIS — Z91048 Other nonmedicinal substance allergy status: Secondary | ICD-10-CM

## 2017-05-14 DIAGNOSIS — F329 Major depressive disorder, single episode, unspecified: Secondary | ICD-10-CM | POA: Diagnosis present

## 2017-05-14 DIAGNOSIS — D649 Anemia, unspecified: Secondary | ICD-10-CM | POA: Diagnosis present

## 2017-05-14 DIAGNOSIS — K219 Gastro-esophageal reflux disease without esophagitis: Secondary | ICD-10-CM | POA: Diagnosis present

## 2017-05-14 LAB — COMPREHENSIVE METABOLIC PANEL
ALBUMIN: 3.7 g/dL (ref 3.5–5.0)
ALT: 22 U/L (ref 14–54)
ANION GAP: 11 (ref 5–15)
AST: 28 U/L (ref 15–41)
Alkaline Phosphatase: 108 U/L (ref 38–126)
BUN: 19 mg/dL (ref 6–20)
CO2: 27 mmol/L (ref 22–32)
Calcium: 9.1 mg/dL (ref 8.9–10.3)
Chloride: 97 mmol/L — ABNORMAL LOW (ref 101–111)
Creatinine, Ser: 1.06 mg/dL — ABNORMAL HIGH (ref 0.44–1.00)
GFR calc non Af Amer: 53 mL/min — ABNORMAL LOW (ref >60)
GLUCOSE: 200 mg/dL — AB (ref 65–99)
POTASSIUM: 4.2 mmol/L (ref 3.5–5.1)
Sodium: 135 mmol/L (ref 135–145)
Total Bilirubin: 0.5 mg/dL (ref 0.3–1.2)
Total Protein: 7.4 g/dL (ref 6.5–8.1)

## 2017-05-14 LAB — CBC
HEMATOCRIT: 40.5 % (ref 36.0–46.0)
HEMOGLOBIN: 13.9 g/dL (ref 12.0–15.0)
MCH: 30.7 pg (ref 26.0–34.0)
MCHC: 34.3 g/dL (ref 30.0–36.0)
MCV: 89.4 fL (ref 78.0–100.0)
Platelets: 442 10*3/uL — ABNORMAL HIGH (ref 150–400)
RBC: 4.53 MIL/uL (ref 3.87–5.11)
RDW: 14.1 % (ref 11.5–15.5)
WBC: 17.9 10*3/uL — ABNORMAL HIGH (ref 4.0–10.5)

## 2017-05-14 LAB — LIPASE, BLOOD: LIPASE: 31 U/L (ref 11–51)

## 2017-05-14 MED ORDER — ONDANSETRON HCL 4 MG/2ML IJ SOLN
4.0000 mg | Freq: Once | INTRAMUSCULAR | Status: DC
  Filled 2017-05-14: qty 2

## 2017-05-14 MED ORDER — MORPHINE SULFATE (PF) 2 MG/ML IV SOLN
4.0000 mg | Freq: Once | INTRAVENOUS | Status: DC
  Filled 2017-05-14: qty 2

## 2017-05-14 NOTE — ED Provider Notes (Signed)
[facility] DEPT Provider Note   CSN: [id number] Arrival date & time: 05/14/17  1922     History   Chief Complaint Chief Complaint  Patient presents with  . Abdominal Pain  . Constipation    HPI Lori [name] is a 66 y.o. female.  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 Patient states that she was treated for small bowel traction at [facility] last week. This is medically managed and no surgical intervention was needed. Patient states that the generalized abdominal pain started last night and progressively worsened today. States her last bowel movement was yesterday without any melena or hematochezia. She reports mild nausea but no emesis. Able to keep down by mouth food today. Describes the pain as sharp and constant in nature. Nothing makes better or worse. She has not tried nothing for her symptoms prior to arrival. She denies any urinary symptoms, vaginal symptoms, fever, chills, headache, vision changes, lightheadedness, dizziness, chest pain, shortness of breath, paresthesias. Past Medical History:  Diagnosis Date  . Anemia 2017   3u blood 2017  . Arthritis    KNEES  . 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 This is raised a concern of malignancy. We'll plan to move forward with laparoscopic possible open appendectomy to rule out malignancy.   . Cervical cancer, FIGO stage IIB ([place]) 05/24/2015  . Cervical carcinoma [facility]) oncologist--  dr Sondra Come for radiation/   [facility] for  chemotherapy   endocervical adenocarcinoma w/  Nodal METS--  FIGO Stage IIB  . Depression   . Endometrial carcinoma [facility])    goes to [facility] in [place] for chemotherapy  . GERD (gastroesophageal reflux disease)   . Heart murmur   . History of blood transfusion   . 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 History of radiation therapy 07/28/15-08/26/15   cervical region 27.5 gray  . History of seizures    post-op craniotomy for aneurysm 1997-- controlled w/ medication since then no seizures  . Hyperlipidemia   . Hypertension   . PMB (postmenopausal bleeding)   . Pulmonary nodule 03/26/2017  . Seizures ([place])    hx of years ago   . Type 2 diabetes, diet controlled ([place])    diet controlled     Patient Active Problem List   Diagnosis Date Noted  . Pulmonary nodule 03/26/2017  . 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 Cervical cancer, FIGO stage IIB ([place]) 05/24/2015    Past Surgical History:  Procedure Laterality Date  . COLONOSCOPY    . CRANIOTOMY POSTERIOR FOSSA  1997   Repair aneurysm  x2  left side  . LAPAROSCOPIC APPENDECTOMY N/A 04/19/2016   Procedure: APPENDECTOMY LAPAROSCOPIC;  Surgeon: Hall Busing, MD;  Location: [facility];  Service: General;  Laterality: N/A;  . PORT-A-CATH PLACEMENT  06/  2016  . TANDEM RING INSERTION N/A 07/28/2015   Procedure: TANDEM RING PLACEMENT;  Surgeon: Gery Pray, MD;  Location: [facility];  Service: Urology;  Laterality: N/A;  . TANDEM RING  INSERTION N/A 08/05/2015   Procedure: TANDEM RING PLACEMENT ;  Surgeon: Gery Pray, MD;  Location[facility];  Service: Urology;  Laterality: N/A;  . TANDEM RING INSERTION N/A 08/11/2015   Procedure: TANDEM RING PLACEMENT ;  Surgeon: Gery Pray, MD;  Location: [facility];  Service: Urology;   Laterality: N/A;  . TANDEM RING INSERTION N/A 08/17/2015   Procedure: TANDEM RING PLACEMENT ;  Surgeon: Gery Pray, MD;  Location: [facility];  Service: Urology;  Laterality: N/A;  . TANDEM RING INSERTION N/A 08/26/2015   Procedure: TANDEM RING PLACEMENT ;  Surgeon: Gery Pray, MD;  Location: [facility];  Service: Urology;  Laterality: N/A;    OB History    No data available       Home Medications    Prior to Admission medications   Medication Sig Start Date End Date Taking? Authorizing Provider  amLODipine (NORVASC) 10 MG tablet Take 10 mg by mouth every morning.  04/29/15  Yes [provider]  aspirin EC 81 MG tablet Take 81 mg by mouth daily.   Yes [provider]  benazepril (LOTENSIN) 40 MG tablet Take 40 mg by mouth every morning.  04/29/15  Yes [provider]  carbamazepine (TEGRETOL) 100 MG chewable tablet Chew 100 mg by mouth 3 (three) times daily.  04/29/15  Yes [provider]  cholecalciferol (VITAMIN D) 1000 units tablet Take 1,000 Units by mouth daily. 2 tablets daily   Yes [provider]  cloNIDine (CATAPRES) 0.2 MG tablet Take 0.2 mg by mouth 3 (three) times daily.  04/29/15  Yes [provider]  gabapentin (NEURONTIN) 300 MG capsule Take 300 mg by mouth 2 (two) times daily.  04/29/15  Yes [provider]  hydrochlorothiazide (HYDRODIURIL) 25 MG tablet Take 25 mg by mouth every morning.  04/29/15  Yes [provider]  magnesium oxide (MAG-OX) 400 (241.3 Mg) MG tablet Take 400 mg by mouth daily.  02/28/16  Yes [provider]  metoprolol (LOPRESSOR) 50 MG tablet Take 25 mg by mouth 2 (two) times daily.  04/29/15  Yes [provider]  potassium chloride SA (K-DUR,KLOR-CON) 20 MEQ tablet Take 20 mEq by mouth 2 (two) times daily.  04/29/15  Yes [provider]  pravastatin (PRAVACHOL) 20 MG tablet Take 20 mg by mouth every evening.  04/29/15  Yes  [provider]  loperamide (IMODIUM A-D) 2 MG tablet Take 2 mg by mouth 4 (four) times daily as needed for diarrhea or loose stools. Reported on 03/28/2016    [provider]    Family History Family History  Problem Relation Age of Onset  . Throat cancer Father   . Cervical cancer Sister     Social History Social History  Substance Use Topics  . Smoking status: Former Smoker    Packs/day: 0.25    Years: 5.00    Types: Cigarettes    Quit date: 12/05/1995  . Smokeless tobacco: Never Used  . Alcohol use No     Allergies   Contrast media [iodinated diagnostic agents]; Dilantin [phenytoin sodium extended]; Latex; and Adhesive [tape]   Review of Systems Review of Systems  Constitutional: Negative for chills and fever.  HENT: Negative for congestion.   Eyes: Negative for visual disturbance.  Respiratory: Negative for cough and shortness of breath.   Cardiovascular: Negative for chest pain, palpitations and leg swelling.  Gastrointestinal: Positive for abdominal distention, abdominal pain, constipation and nausea. Negative for  blood in stool, diarrhea and vomiting.  Genitourinary: Negative for dysuria, flank pain, frequency, hematuria and urgency.  Musculoskeletal: Negative for back pain.  Skin: Negative.   Neurological: Negative for dizziness, syncope, weakness, light-headedness and headaches.     Physical Exam Updated Vital Signs BP (!) 148/98 (BP Location: Right Arm)   Pulse 92   Temp 98.7 F (37.1 C) (Oral)   Resp 18   Ht 5\' 8"  (1.727 m)   Wt 95.3 kg (210 lb)   SpO2 99%   BMI 31.93 kg/m   Physical Exam  Constitutional: She is oriented to person, place, and time. She appears well-developed and well-nourished. No distress.  Patient is nontoxic appearing. Appears be in no acute distress.  HENT:  Head: Normocephalic and atraumatic.  Mouth/Throat: Oropharynx is clear and moist.  Eyes: Conjunctivae are normal. Right eye exhibits no discharge. Left  eye exhibits no discharge. No scleral icterus.  Neck: Normal range of motion. Neck supple. No thyromegaly present.  Cardiovascular: Normal rate, regular rhythm, normal heart sounds and intact distal pulses.  Exam reveals no gallop and no friction rub.   No murmur heard. Pulmonary/Chest: Effort normal and breath sounds normal. No respiratory distress. She has no wheezes. She has no rales. She exhibits no tenderness.  Abdominal: Soft. She exhibits distension. Bowel sounds are absent. There is generalized tenderness. There is no rigidity, no rebound, no guarding, no CVA tenderness, no tenderness at McBurney's point and negative Murphy's sign.  Musculoskeletal: Normal range of motion.  Lymphadenopathy:    She has no cervical adenopathy.  Neurological: She is alert and oriented to person, place, and time.  Skin: Skin is warm and dry. Capillary refill takes less than 2 seconds.  Nursing note and vitals reviewed.    ED Treatments / Results  Labs (all labs ordered are listed, but only abnormal results are displayed) Labs Reviewed  COMPREHENSIVE METABOLIC PANEL - Abnormal; Notable for the following:       Result Value   Chloride 97 (*)    Glucose, Bld 200 (*)    Creatinine, Ser 1.06 (*)    GFR calc non Af Amer 53 (*)    All other components within normal limits  CBC - Abnormal; Notable for the following:    WBC 17.9 (*)    Platelets 442 (*)    All other components within normal limits  LIPASE, BLOOD  URINALYSIS, ROUTINE W REFLEX MICROSCOPIC    EKG  EKG Interpretation None       Radiology No results found.  Procedures Procedures (including critical care time)  Medications Ordered in ED Medications  morphine 2 MG/ML injection 4 mg (not administered)  ondansetron (ZOFRAN) injection 4 mg (not administered)     Initial Impression / Assessment and Plan / ED Course  I have reviewed the triage vital signs and the nursing notes.  Pertinent labs & imaging results that were  available during my care of the patient were reviewed by me and considered in my medical decision making (see chart for details).   Patient resents to the emergency Department today with complaints of worsening distended abdomen, abdominal pain, nausea. The patient was treated for small bowel obstruction in the hospital last week. On exam patient does have distention of the belly with diffuse abdominal pain. Vital signs are stable. She is afebrile. She has a white count of 17,000. UA shows no signs of infection. Liver functions are normal.  CT of abdomen does show worsening small bowel obstruction. NG tube placed.  Pain is managed in the ED. Patient is currently hemodynamically stable. Patient does have a port. X-ray shows port placement. Nursing staff accessing port. Spoke with Dr. Lucia Gaskins with general surgery who will evaluate patient in the a.m.  Spoke with Dr. Alcario Drought with [facility] who will admit pt. Pt up dated on plan of care.   Final Clinical Impressions(s) / ED Diagnoses   Final diagnoses:  Small bowel obstruction [facility])    New Prescriptions New Prescriptions   No medications on file     Aaron Edelman 05/15/17 0358    Molpus, Jenny Reichmann, MD 05/15/17 [id number]

## 2017-05-14 NOTE — ED Notes (Signed)
Patient attempted urination, but was unable to urinate.

## 2017-05-14 NOTE — ED Triage Notes (Signed)
Pt reports having abd pain and constipation for the last day. Pt states she was recently dx with small bowel obstruction and admitted at [facility] for 3 days.

## 2017-05-15 ENCOUNTER — Emergency Department (HOSPITAL_COMMUNITY): Payer: Medicare Other

## 2017-05-15 DIAGNOSIS — Z9221 Personal history of antineoplastic chemotherapy: Secondary | ICD-10-CM | POA: Diagnosis not present

## 2017-05-15 DIAGNOSIS — I10 Essential (primary) hypertension: Secondary | ICD-10-CM | POA: Diagnosis not present

## 2017-05-15 DIAGNOSIS — Z87891 Personal history of nicotine dependence: Secondary | ICD-10-CM | POA: Diagnosis not present

## 2017-05-15 DIAGNOSIS — Z9049 Acquired absence of other specified parts of digestive tract: Secondary | ICD-10-CM | POA: Diagnosis not present

## 2017-05-15 DIAGNOSIS — Z923 Personal history of irradiation: Secondary | ICD-10-CM | POA: Diagnosis not present

## 2017-05-15 DIAGNOSIS — Z91040 Latex allergy status: Secondary | ICD-10-CM | POA: Diagnosis not present

## 2017-05-15 DIAGNOSIS — K56609 Unspecified intestinal obstruction, unspecified as to partial versus complete obstruction: Secondary | ICD-10-CM | POA: Diagnosis not present

## 2017-05-15 DIAGNOSIS — Z85038 Personal history of other malignant neoplasm of large intestine: Secondary | ICD-10-CM | POA: Diagnosis not present

## 2017-05-15 DIAGNOSIS — K219 Gastro-esophageal reflux disease without esophagitis: Secondary | ICD-10-CM | POA: Diagnosis present

## 2017-05-15 DIAGNOSIS — C181 Malignant neoplasm of appendix: Secondary | ICD-10-CM | POA: Diagnosis not present

## 2017-05-15 DIAGNOSIS — Z8541 Personal history of malignant neoplasm of cervix uteri: Secondary | ICD-10-CM | POA: Diagnosis not present

## 2017-05-15 DIAGNOSIS — E876 Hypokalemia: Secondary | ICD-10-CM | POA: Diagnosis present

## 2017-05-15 DIAGNOSIS — K56699 Other intestinal obstruction unspecified as to partial versus complete obstruction: Secondary | ICD-10-CM | POA: Diagnosis present

## 2017-05-15 DIAGNOSIS — Z7982 Long term (current) use of aspirin: Secondary | ICD-10-CM | POA: Diagnosis not present

## 2017-05-15 DIAGNOSIS — E785 Hyperlipidemia, unspecified: Secondary | ICD-10-CM | POA: Diagnosis present

## 2017-05-15 DIAGNOSIS — C539 Malignant neoplasm of cervix uteri, unspecified: Secondary | ICD-10-CM | POA: Diagnosis not present

## 2017-05-15 DIAGNOSIS — Z4659 Encounter for fitting and adjustment of other gastrointestinal appliance and device: Secondary | ICD-10-CM | POA: Diagnosis not present

## 2017-05-15 DIAGNOSIS — F329 Major depressive disorder, single episode, unspecified: Secondary | ICD-10-CM | POA: Diagnosis present

## 2017-05-15 DIAGNOSIS — Z8049 Family history of malignant neoplasm of other genital organs: Secondary | ICD-10-CM | POA: Diagnosis not present

## 2017-05-15 DIAGNOSIS — Z91041 Radiographic dye allergy status: Secondary | ICD-10-CM | POA: Diagnosis not present

## 2017-05-15 DIAGNOSIS — Z23 Encounter for immunization: Secondary | ICD-10-CM | POA: Diagnosis not present

## 2017-05-15 DIAGNOSIS — Z79899 Other long term (current) drug therapy: Secondary | ICD-10-CM | POA: Diagnosis not present

## 2017-05-15 DIAGNOSIS — R911 Solitary pulmonary nodule: Secondary | ICD-10-CM | POA: Diagnosis present

## 2017-05-15 DIAGNOSIS — Z8542 Personal history of malignant neoplasm of other parts of uterus: Secondary | ICD-10-CM | POA: Diagnosis not present

## 2017-05-15 DIAGNOSIS — D649 Anemia, unspecified: Secondary | ICD-10-CM | POA: Diagnosis present

## 2017-05-15 DIAGNOSIS — E119 Type 2 diabetes mellitus without complications: Secondary | ICD-10-CM | POA: Diagnosis present

## 2017-05-15 LAB — URINALYSIS, ROUTINE W REFLEX MICROSCOPIC
Bilirubin Urine: NEGATIVE
GLUCOSE, UA: NEGATIVE mg/dL
HGB URINE DIPSTICK: NEGATIVE
Ketones, ur: NEGATIVE mg/dL
Leukocytes, UA: NEGATIVE
NITRITE: NEGATIVE
PH: 5 (ref 5.0–8.0)
Protein, ur: 30 mg/dL — AB
Specific Gravity, Urine: 1.028 (ref 1.005–1.030)
Squamous Epithelial / LPF: NONE SEEN

## 2017-05-15 LAB — CBC
HCT: 33 % — ABNORMAL LOW (ref 36.0–46.0)
Hemoglobin: 11 g/dL — ABNORMAL LOW (ref 12.0–15.0)
MCH: 29.7 pg (ref 26.0–34.0)
MCHC: 33.3 g/dL (ref 30.0–36.0)
MCV: 89.2 fL (ref 78.0–100.0)
PLATELETS: 325 10*3/uL (ref 150–400)
RBC: 3.7 MIL/uL — AB (ref 3.87–5.11)
RDW: 14.3 % (ref 11.5–15.5)
WBC: 10.5 10*3/uL (ref 4.0–10.5)

## 2017-05-15 LAB — BASIC METABOLIC PANEL
Anion gap: 7 (ref 5–15)
BUN: 21 mg/dL — ABNORMAL HIGH (ref 6–20)
CALCIUM: 8.3 mg/dL — AB (ref 8.9–10.3)
CHLORIDE: 98 mmol/L — AB (ref 101–111)
CO2: 32 mmol/L (ref 22–32)
CREATININE: 0.77 mg/dL (ref 0.44–1.00)
GFR calc Af Amer: >60 mL/min (ref >60)
Glucose, Bld: 142 mg/dL — ABNORMAL HIGH (ref 65–99)
Potassium: 3.7 mmol/L (ref 3.5–5.1)
SODIUM: 137 mmol/L (ref 135–145)

## 2017-05-15 MED ORDER — MORPHINE SULFATE (PF) 2 MG/ML IV SOLN
2.0000 mg | INTRAVENOUS | Status: DC | PRN
  Administered 2017-05-15: 4 mg via INTRAVENOUS
  Administered 2017-05-15 – 2017-05-16 (×3): 2 mg via INTRAVENOUS
  Administered 2017-05-16: 4 mg via INTRAVENOUS
  Administered 2017-05-16 – 2017-05-18 (×4): 2 mg via INTRAVENOUS
  Filled 2017-05-15: qty 1
  Filled 2017-05-15 (×2): qty 2
  Filled 2017-05-15 (×6): qty 1

## 2017-05-15 MED ORDER — GABAPENTIN 300 MG PO CAPS: 300.0000 mg | ORAL_CAPSULE | Freq: Two times a day (BID) | ORAL | Status: DC

## 2017-05-15 MED ORDER — POTASSIUM CHLORIDE 10 MEQ/100ML IV SOLN
10.0000 meq | INTRAVENOUS | Status: AC
  Administered 2017-05-15 (×2): 10 meq via INTRAVENOUS
  Filled 2017-05-15 (×2): qty 100

## 2017-05-15 MED ORDER — ASPIRIN EC 81 MG PO TBEC: 81.0000 mg | DELAYED_RELEASE_TABLET | Freq: Every day | ORAL | Status: DC

## 2017-05-15 MED ORDER — CLONIDINE HCL 0.1 MG PO TABS: 0.2000 mg | ORAL_TABLET | Freq: Three times a day (TID) | ORAL | Status: DC

## 2017-05-15 MED ORDER — CARBAMAZEPINE 100 MG PO CHEW
100.0000 mg | CHEWABLE_TABLET | Freq: Three times a day (TID) | ORAL | Status: DC
  Administered 2017-05-15 – 2017-05-19 (×13): 100 mg via ORAL
  Filled 2017-05-15 (×14): qty 1

## 2017-05-15 MED ORDER — SODIUM CHLORIDE 0.9 % IV SOLN
INTRAVENOUS | Status: DC
  Administered 2017-05-15 – 2017-05-17 (×6): via INTRAVENOUS
  Administered 2017-05-18 (×2): 125 mL/h via INTRAVENOUS
  Administered 2017-05-18 – 2017-05-19 (×2): via INTRAVENOUS

## 2017-05-15 MED ORDER — ONDANSETRON HCL 4 MG/2ML IJ SOLN: 4.0000 mg | Freq: Four times a day (QID) | INTRAMUSCULAR | Status: DC | PRN

## 2017-05-15 MED ORDER — MORPHINE SULFATE (PF) 2 MG/ML IV SOLN
4.0000 mg | Freq: Once | INTRAVENOUS | Status: AC
  Administered 2017-05-15: 4 mg via INTRAMUSCULAR

## 2017-05-15 MED ORDER — ENOXAPARIN SODIUM 40 MG/0.4ML ~~LOC~~ SOLN
40.0000 mg | SUBCUTANEOUS | Status: DC
  Administered 2017-05-15 – 2017-05-19 (×5): 40 mg via SUBCUTANEOUS
  Filled 2017-05-15 (×5): qty 0.4

## 2017-05-15 MED ORDER — ACETAMINOPHEN 650 MG RE SUPP
650.0000 mg | RECTAL | Status: DC | PRN
  Administered 2017-05-16: 650 mg via RECTAL
  Filled 2017-05-15: qty 1

## 2017-05-15 MED ORDER — ONDANSETRON 4 MG PO TBDP
4.0000 mg | ORAL_TABLET | Freq: Once | ORAL | Status: AC
  Administered 2017-05-15: 4 mg via ORAL
  Filled 2017-05-15: qty 1

## 2017-05-15 MED ORDER — CLONIDINE HCL 0.2 MG/24HR TD PTWK
0.2000 mg | MEDICATED_PATCH | TRANSDERMAL | Status: DC
  Administered 2017-05-15: 10:00:00 0.2 mg via TRANSDERMAL
  Filled 2017-05-15: qty 1

## 2017-05-15 MED ORDER — SODIUM CHLORIDE 0.9% FLUSH
10.0000 mL | INTRAVENOUS | Status: DC | PRN
  Administered 2017-05-18 – 2017-05-19 (×2): 10 mL
  Filled 2017-05-15 (×2): qty 40

## 2017-05-15 MED ORDER — BENAZEPRIL HCL 20 MG PO TABS
40.0000 mg | ORAL_TABLET | Freq: Every morning | ORAL | Status: DC
  Filled 2017-05-15: qty 2

## 2017-05-15 MED ORDER — METOPROLOL TARTRATE 25 MG PO TABS: 25.0000 mg | ORAL_TABLET | Freq: Two times a day (BID) | ORAL | Status: DC

## 2017-05-15 MED ORDER — METOPROLOL TARTRATE 5 MG/5ML IV SOLN
5.0000 mg | Freq: Four times a day (QID) | INTRAVENOUS | Status: DC
  Administered 2017-05-15 – 2017-05-19 (×12): 5 mg via INTRAVENOUS
  Filled 2017-05-15 (×18): qty 5

## 2017-05-15 MED ORDER — PRAVASTATIN SODIUM 20 MG PO TABS: 20.0000 mg | ORAL_TABLET | Freq: Every evening | ORAL | Status: DC

## 2017-05-15 MED ORDER — AMLODIPINE BESYLATE 5 MG PO TABS: 10.0000 mg | ORAL_TABLET | Freq: Every morning | ORAL | Status: DC

## 2017-05-15 NOTE — Progress Notes (Signed)
[facility] PLAN OF CARE NOTE Patient: Lori [name] YKD:[id number]   PCP: [name], MD DOB: Jan 08, 1951   DOA: 05/14/2017   DOS: 05/15/2017    Patient was admitted by my colleague Dr. Alcario Drought earlier on 05/15/2017. I have reviewed the H&P as well as assessment and plan and agree with the same. Important changes in the plan are listed below.  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 I have changed patient's blood pressure medication. Clonidine changed to patch. Lopressor changed to IV. Rest of the medications are on hold.  Author: Berle Mull, MD [facility] Pager: [phone] 05/15/2017 6:04 PM   If 7PM-7AM, please contact night-coverage at [email or web address], password [facility]

## 2017-05-15 NOTE — Consult Note (Signed)
[name] Consult/Admission Note  Lori [name] 05-03-1951  [id number].    Requesting MD: Dr. 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 Questionable recurrence in right upper lobe with plans for repeat chest CT this month according to oncology note 04/27/17.   Patient was hospitalized last week at [facility] for small bowel obstruction. She states she was in the hospital for 3 days and released. She states she was eating a regular diet prior to discharge. Patient states she was feeling fine up until 2 days ago. She started having periumbilical abdominal pain that was constant, nonradiating, waxed and waned in severity, relieved with flatus. Last BM was yesterday and patient was passing gas yesterday. Patient had no vomiting. Associated nausea. Patient states she was able to keep down a baked potato yesterday. Patient denies chest pain, shortness breath, dizziness, loss consciousness, dysuria, hematuria, fever, chills, diarrhea. Patient states her abdominal pain has improved since admission to the hospital.  ED Course: Glucose 200, Creatinine 1.06, WBC 17.9 CT Abd/pel: Findings again consistent with mechanical small bowel obstruction, the stomach is markedly enlarged. Overall increased dilatation of the small bowel loops since the previous study, consistent with worsening obstruction. No free air, however increasing free fluid in the abdomen and pelvis. Suspect thickening of the endometrial stripe, this may be correlated with pelvic ultrasound Chest xray: Right-sided chest port noted ending about the distal SVC  ROS:  Review of Systems  Constitutional: Negative for chills and fever.  Respiratory: Negative for shortness of breath.   Cardiovascular: Negative for chest pain and leg swelling.   Gastrointestinal: Positive for abdominal pain and nausea. Negative for constipation, diarrhea and vomiting.  Genitourinary: Negative for dysuria and hematuria.  Neurological: Negative for dizziness, loss of consciousness and headaches.  All other systems reviewed and are negative.    Family History  Problem Relation Age of Onset  . Throat cancer Father   . Cervical cancer Sister     Past Medical History:  Diagnosis Date  . Anemia 2017   3u blood 2017  . Arthritis    KNEES  . Carcinoma of appendix ([place]) 04/11/2016   Patient with a history of cervical cancer status post chemoradiation with a persistent finding of a dilated appendix with hypermetabolism. This is raised a concern of malignancy. We'll plan to move forward with laparoscopic possible open appendectomy to rule out malignancy.   . Cervical cancer, FIGO stage IIB ([place]) 05/24/2015  . Cervical carcinoma [facility]) oncologist--  dr Sondra Come for radiation/   [facility] for chemotherapy   endocervical adenocarcinoma w/  Nodal METS--  FIGO Stage IIB  . Depression   . Endometrial carcinoma [facility])    goes to [facility] in [place] for chemotherapy  . GERD (gastroesophageal reflux disease)   . Heart murmur   . History of blood transfusion   . 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 History of radiation therapy 07/28/15-08/26/15   cervical region 27.5 gray  . History of seizures    post-op craniotomy for aneurysm 1997-- controlled w/ medication since then no seizures  . Hyperlipidemia   . Hypertension   . PMB (postmenopausal bleeding)   . Pulmonary nodule 03/26/2017  . Seizures ([place])    hx of years ago   .  Type 2 diabetes, diet controlled ([place])    diet controlled     Past Surgical History:  Procedure Laterality Date  . COLONOSCOPY    . CRANIOTOMY POSTERIOR FOSSA  1997   Repair aneurysm  x2  left side  . LAPAROSCOPIC APPENDECTOMY  N/A 04/19/2016   Procedure: APPENDECTOMY LAPAROSCOPIC;  Surgeon: Hall Busing, MD;  Location: [facility];  Service: General;  Laterality: N/A;  . PORT-A-CATH PLACEMENT  06/  2016  . TANDEM RING INSERTION N/A 07/28/2015   Procedure: TANDEM RING PLACEMENT;  Surgeon: Gery Pray, MD;  Location: [facility];  Service: Urology;  Laterality: N/A;  . TANDEM RING INSERTION N/A 08/05/2015   Procedure: TANDEM RING PLACEMENT ;  Surgeon: Gery Pray, MD;  Location[facility];  Service: Urology;  Laterality: N/A;  . TANDEM RING INSERTION N/A 08/11/2015   Procedure: TANDEM RING PLACEMENT ;  Surgeon: Gery Pray, MD;  Location: [facility];  Service: Urology;  Laterality: N/A;  . TANDEM RING INSERTION N/A 08/17/2015   Procedure: TANDEM RING PLACEMENT ;  Surgeon: Gery Pray, MD;  Location: [facility];  Service: Urology;  Laterality: N/A;  . TANDEM RING INSERTION N/A 08/26/2015   Procedure: TANDEM RING PLACEMENT ;  Surgeon: Gery Pray, MD;  Location: [facility];  Service: Urology;  Laterality: N/A;    Social History:  reports that she quit smoking about 21 years ago. Her smoking use included Cigarettes. She has a 1.25 pack-year smoking history. She has never used smokeless tobacco. She reports that she does not drink alcohol or use drugs.  Allergies:  Allergies  Allergen Reactions  . Contrast Media [Iodinated Diagnostic Agents] Itching and Swelling  . Dilantin [Phenytoin Sodium Extended] Itching, Swelling and Other (See Comments)    Whole body  . Latex Hives and Itching  . Adhesive [Tape] Rash    Medications Prior to Admission  Medication Sig Dispense Refill  . amLODipine (NORVASC) 10 MG tablet Take 10 mg by mouth every morning.   5  . aspirin EC 81 MG tablet Take 81 mg by mouth daily.    . benazepril (LOTENSIN) 40 MG tablet Take 40 mg by mouth every morning.   5  . carbamazepine (TEGRETOL) 100 MG chewable tablet Chew 100 mg  by mouth 3 (three) times daily.   5  . cholecalciferol (VITAMIN D) 1000 units tablet Take 1,000 Units by mouth daily. 2 tablets daily    . cloNIDine (CATAPRES) 0.2 MG tablet Take 0.2 mg by mouth 3 (three) times daily.   6  . gabapentin (NEURONTIN) 300 MG capsule Take 300 mg by mouth 2 (two) times daily.   5  . hydrochlorothiazide (HYDRODIURIL) 25 MG tablet Take 25 mg by mouth every morning.   11  . magnesium oxide (MAG-OX) 400 (241.3 Mg) MG tablet Take 400 mg by mouth daily.     . metoprolol (LOPRESSOR) 50 MG tablet Take 25 mg by mouth 2 (two) times daily.   5  . potassium chloride SA (K-DUR,KLOR-CON) 20 MEQ tablet Take 20 mEq by mouth 2 (two) times daily.   6  . pravastatin (PRAVACHOL) 20 MG tablet Take 20 mg by mouth every evening.   2  . loperamide (IMODIUM A-D) 2 MG tablet Take 2 mg by mouth 4 (four) times daily as needed for diarrhea or loose stools. Reported on 03/28/2016      Blood pressure 138/85, pulse 76, temperature 99.2 F (37.3 C), temperature source Oral, resp.  rate 18, height '5\' 8"'$  (1.727 m), weight 210 lb (95.3 kg), SpO2 94 %.  Physical Exam  Constitutional: She is oriented to person, place, and time and well-developed, well-nourished, and in no distress. Vital signs are normal. No distress.  HENT:  Head: Normocephalic and atraumatic.  Nose: Nose normal.  Mouth/Throat: Oropharynx is clear and moist. No oropharyngeal exudate.  Eyes: Conjunctivae are normal. Pupils are equal, round, and reactive to light. Right eye exhibits no discharge. Left eye exhibits no discharge. No scleral icterus.  Neck: Normal range of motion. Neck supple.  Cardiovascular: Normal rate, regular rhythm, S1 normal, S2 normal, normal heart sounds and intact distal pulses.   Pulses:      Radial pulses are 2+ on the right side, and 2+ on the left side.  Pulmonary/Chest: Effort normal and breath sounds normal. No respiratory distress. She has no decreased breath sounds. She has no wheezes. She has no rhonchi.  She has no rales.  Abdominal: Soft. Normal appearance and bowel sounds are normal. She exhibits no distension and no mass. There is tenderness (mild). There is no rebound and no guarding. No hernia.  Musculoskeletal: Normal range of motion. She exhibits no edema or deformity.  Neurological: She is alert and oriented to person, place, and time.  Skin: Skin is warm and dry. No rash noted. She is not diaphoretic.  Psychiatric: Mood and affect normal.  Nursing note and vitals reviewed.   Results for orders placed or performed during the hospital encounter of 05/14/17 (from the past 48 hour(s))  Lipase, blood     Status: None   Collection Time: 05/14/17  9:06 PM  Result Value Ref Range   Lipase 31 11 - 51 U/L  Comprehensive metabolic panel     Status: Abnormal   Collection Time: 05/14/17  9:06 PM  Result Value Ref Range   Sodium 135 135 - 145 mmol/L   Potassium 4.2 3.5 - 5.1 mmol/L   Chloride 97 (L) 101 - 111 mmol/L   CO2 27 22 - 32 mmol/L   Glucose, Bld 200 (H) 65 - 99 mg/dL   BUN 19 6 - 20 mg/dL   Creatinine, Ser 1.06 (H) 0.44 - 1.00 mg/dL   Calcium 9.1 8.9 - 10.3 mg/dL   Total Protein 7.4 6.5 - 8.1 g/dL   Albumin 3.7 3.5 - 5.0 g/dL   AST 28 15 - 41 U/L   ALT 22 14 - 54 U/L   Alkaline Phosphatase 108 38 - 126 U/L   Total Bilirubin 0.5 0.3 - 1.2 mg/dL   GFR calc non Af Amer 53 (L) >60 mL/min   GFR calc Af Amer >60 >60 mL/min    Comment: (NOTE) The eGFR has been calculated using the CKD EPI equation. This calculation has not been validated in all clinical situations. eGFR's persistently <60 mL/min signify possible Chronic Kidney Disease.    Anion gap 11 5 - 15  CBC     Status: Abnormal   Collection Time: 05/14/17  9:06 PM  Result Value Ref Range   WBC 17.9 (H) 4.0 - 10.5 K/uL   RBC 4.53 3.87 - 5.11 MIL/uL   Hemoglobin 13.9 12.0 - 15.0 g/dL   HCT 40.5 36.0 - 46.0 %   MCV 89.4 78.0 - 100.0 fL   MCH 30.7 26.0 - 34.0 pg   MCHC 34.3 30.0 - 36.0 g/dL   RDW 14.1 11.5 - 15.5 %    Platelets 442 (H) 150 - 400 K/uL  Urinalysis, Routine w  reflex microscopic     Status: Abnormal   Collection Time: 05/15/17  2:30 AM  Result Value Ref Range   Color, Urine AMBER (A) YELLOW    Comment: BIOCHEMICALS MAY BE AFFECTED BY COLOR   APPearance CLEAR CLEAR   Specific Gravity, Urine 1.028 1.005 - 1.030   pH 5.0 5.0 - 8.0   Glucose, UA NEGATIVE NEGATIVE mg/dL   Hgb urine dipstick NEGATIVE NEGATIVE   Bilirubin Urine NEGATIVE NEGATIVE   Ketones, ur NEGATIVE NEGATIVE mg/dL   Protein, ur 30 (A) NEGATIVE mg/dL   Nitrite NEGATIVE NEGATIVE   Leukocytes, UA NEGATIVE NEGATIVE   RBC / HPF 0-5 0 - 5 RBC/hpf   WBC, UA 0-5 0 - 5 WBC/hpf   Bacteria, UA RARE (A) NONE SEEN   Squamous Epithelial / LPF NONE SEEN NONE SEEN   Mucous PRESENT    Ct Abdomen Pelvis Wo Contrast  Result Date: 05/15/2017 CLINICAL DATA:  Abdominal pain and constipation EXAM: CT ABDOMEN AND PELVIS WITHOUT CONTRAST TECHNIQUE: Multidetector CT imaging of the abdomen and pelvis was performed following the standard protocol without IV contrast. COMPARISON:  05/05/2017, 05/03/2017 FINDINGS: Lower chest: Lung bases demonstrate no acute consolidation or effusion. There is cardiomegaly. Small pericardial effusion. Coronary artery calcification. Hiatal hernia with distal esophageal thickening. Hepatobiliary: No calcified gallstones. No biliary dilatation or focal hepatic abnormality. Pancreas: Unremarkable. No pancreatic ductal dilatation or surrounding inflammatory changes. Spleen: Normal in size without focal abnormality. Adrenals/Urinary Tract: Adrenal glands are within normal limits. Atrophic and scarred left kidney. No hydronephrosis. The bladder is unremarkable. Stomach/Bowel: Marked enlargement of the stomach. Multiple loops of dilated, fluid-filled small bowel with slight increased distension since the previous examination. Findings consistent with mechanical bowel obstruction. No colon wall thickening. Prior appendectomy.  Vascular/Lymphatic: Aortic atherosclerosis. No enlarged abdominal or pelvic lymph nodes. Reproductive: Suspect that there is thickening of the endometrial stripe, measuring 19 mm on sagittal views. No adnexal masses. Other: New or increased free fluid in the pelvis and around the liver. No free air. Small pockets of soft tissue gas in the left gluteal region. Musculoskeletal: No acute or suspicious bone lesion. IMPRESSION: 1. Findings again consistent with mechanical small bowel obstruction, the stomach is markedly enlarged. Overall increased dilatation of the small bowel loops since the previous study, consistent with worsening obstruction. No free air, however increasing free fluid in the abdomen and pelvis. 2. Cardiomegaly with small pericardial effusion. 3. Suspect thickening of the endometrial stripe, this may be correlated with pelvic ultrasound 4. Electronically Signed   By: Donavan Foil M.D.   On: 05/15/2017 02:44   Dg Chest 1 View  Result Date: 05/15/2017 CLINICAL DATA:  Assess position of right-sided chest port. Initial encounter. EXAM: CHEST 1 VIEW COMPARISON:  PET/CT performed 02/02/2017 FINDINGS: The patient's right-sided chest port is noted ending about the distal SVC. An enteric tube is noted extending below the diaphragm. Mild peribronchial thickening is noted. No pleural effusion or pneumothorax is seen. The cardiomediastinal silhouette is borderline normal in size. No acute osseous abnormalities are identified. IMPRESSION: 1. Right-sided chest port noted ending about the distal SVC. 2. Mild peribronchial thickening noted.  Lungs otherwise clear. Electronically Signed   By: Garald Balding M.D.   On: 05/15/2017 03:41   Dg Abdomen 1 View  Result Date: 05/15/2017 CLINICAL DATA:  NG tube placement EXAM: ABDOMEN - 1 VIEW COMPARISON:  05/15/2017 FINDINGS: Esophageal tube tip overlies the mid stomach. Multiple loops of dilated small bowel consistent with a bowel obstruction. IMPRESSION: Esophageal  tube tip overlies the mid stomach. Electronically Signed   By: Donavan Foil M.D.   On: 05/15/2017 03:36      Assessment/Plan  Hx of IIB endocervical adenocarcinoma s/p chemoradiation  Hx of appendiceal mucinous adenocarcinoma s/p appendectomy. HTN Hx of cerebral aneurysm repair Hx of seizures Type II DM diet controlled  SBO - NG tube output 350 mL overnight - Patient was having flatus yesterday and a bowel movement not sure she is completely obstructed. Patient states abdominal pain improved with flatus yesterday. No vomiting - Recommend continue NG tube today and if low output overnight clamp tomorrow and start patient on clears as long as her symptoms do not worsen - Cannot do SBO protocol as pt is allergic to contrast  We'll continue to follow this patient. Thank you for the consult.  Kalman Drape, [facility] 05/15/2017, 7:33 AM Pager: [phone] Consults: [phone] Mon-Fri 7:00 am-4:30 pm Sat-Sun 7:00 am-11:30 am

## 2017-05-15 NOTE — H&P (Signed)
History and Physical    Barb Shear QQP:[id number] DOB: 1951-04-13 DOA: 05/14/2017  PCP: [name], MD  Patient coming from: Home  I have personally briefly reviewed patient's old medical records in [place]  Chief Complaint: Abd pain, distention  HPI: Lori [name] is a 66 y.o. female with medical history significant of cervical cancer and endometrial carcinoma stage 2B with complete response to chemo/radiation, during follow up PET scan in 2017 showed activity in appendix.  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  No additional systemic chemo / radiation done according to her April oncology office note.  Patient was admitted about 2 weeks ago to [facility] with SBO.  This resolved after medical therapy after a couple of days and patient was discharged.  She had recurrence of generalized abd pain, distention, last night.  Last BM was yesterday.  Says symptoms are like SBO again.  Pain is sharp and constant in nature.  Mild nausea but no emesis.  Nothing makes better or worse.  Nothing tried for symptoms PTA.   ED Course: SBO that looks worse than last week seen on CT abd/pelvis.   Review of Systems: As per HPI otherwise 10 point review of systems negative.   Past Medical History:  Diagnosis Date  . Anemia 2017   3u blood 2017  . Arthritis    KNEES  . Carcinoma of appendix ([place]) 04/11/2016   Patient with a history of cervical cancer status post chemoradiation with a persistent finding of a dilated appendix with hypermetabolism. This is raised a concern of malignancy. We'll plan to move forward with laparoscopic possible open appendectomy to rule out malignancy.   . Cervical cancer, FIGO stage IIB ([place]) 05/24/2015  . 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  Depression   . Endometrial carcinoma [facility])    goes to [facility] in [place] for chemotherapy  . GERD (gastroesophageal reflux disease)   . Heart murmur   . History of blood transfusion   . 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 History of radiation therapy 07/28/15-08/26/15   cervical region 27.5 gray  . History of seizures    post-op craniotomy for aneurysm 1997-- controlled w/ medication since then no seizures  . Hyperlipidemia   . Hypertension   . PMB (postmenopausal bleeding)   . Pulmonary nodule 03/26/2017  . Seizures ([place])    hx of years ago   . Type 2 diabetes, diet controlled ([place])    diet controlled     Past Surgical History:  Procedure Laterality Date  . COLONOSCOPY    . CRANIOTOMY POSTERIOR FOSSA  1997   Repair aneurysm  x2  left side  . LAPAROSCOPIC APPENDECTOMY N/A 04/19/2016   Procedure: APPENDECTOMY LAPAROSCOPIC;  Surgeon: Hall Busing, MD;  Location: [facility];  Service: General;  Laterality: N/A;  . PORT-A-CATH PLACEMENT  06/  2016  . TANDEM RING INSERTION N/A 07/28/2015   Procedure: TANDEM RING PLACEMENT;  Surgeon: Gery Pray, MD;  Location: [facility];  Service: Urology;  Laterality: N/A;  . TANDEM RING INSERTION N/A 08/05/2015   Procedure: TANDEM RING PLACEMENT ;  Surgeon: Gery Pray, MD;  Location: [facility];  Service: Urology;  Laterality: N/A;  . TANDEM RING INSERTION N/A 08/11/2015   Procedure: TANDEM RING PLACEMENT ;  Surgeon: Gery Pray, MD;  Location: [facility];  Service: Urology;  Laterality: N/A;  . TANDEM RING INSERTION N/A 08/17/2015   Procedure: TANDEM RING PLACEMENT ;  Surgeon: Gery Pray, MD;  Location[facility];  Service: Urology;  Laterality: N/A;  . TANDEM RING INSERTION N/A 08/26/2015   Procedure: TANDEM RING PLACEMENT ;  Surgeon: Gery Pray, MD;  Location: [facility];   Service: Urology;  Laterality: N/A;     reports that she quit smoking about 21 years ago. Her smoking use included Cigarettes. She has a 1.25 pack-year smoking history. She has never used smokeless tobacco. She reports that she does not drink alcohol or use drugs.  Allergies  Allergen Reactions  . Contrast Media [Iodinated Diagnostic Agents] Itching and Swelling  . Dilantin [Phenytoin Sodium Extended] Itching, Swelling and Other (See Comments)    Whole body  . Latex Hives and Itching  . Adhesive [Tape] Rash    Family History  Problem Relation Age of Onset  . Throat cancer Father   . Cervical cancer Sister      Prior to Admission medications   Medication Sig Start Date End Date Taking? Authorizing Provider  amLODipine (NORVASC) 10 MG tablet Take 10 mg by mouth every morning.  04/29/15  Yes [provider]  aspirin EC 81 MG tablet Take 81 mg by mouth daily.   Yes [provider]  benazepril (LOTENSIN) 40 MG tablet Take 40 mg by mouth every morning.  04/29/15  Yes [provider]  carbamazepine (TEGRETOL) 100 MG chewable tablet Chew 100 mg by mouth 3 (three) times daily.  04/29/15  Yes [provider]  cholecalciferol (VITAMIN D) 1000 units tablet Take 1,000 Units by mouth daily. 2 tablets daily   Yes [provider]  cloNIDine (CATAPRES) 0.2 MG tablet Take 0.2 mg by mouth 3 (three) times daily.  04/29/15  Yes [provider]  gabapentin (NEURONTIN) 300 MG capsule Take 300 mg by mouth 2 (two) times daily.  04/29/15  Yes [provider]  hydrochlorothiazide (HYDRODIURIL) 25 MG tablet Take 25 mg by mouth every morning.  04/29/15  Yes [provider]  magnesium oxide (MAG-OX) 400 (241.3 Mg) MG tablet Take 400 mg by mouth daily.  02/28/16  Yes [provider]  metoprolol (LOPRESSOR) 50 MG tablet Take 25 mg by mouth 2 (two) times daily.  04/29/15  Yes [provider]  potassium chloride SA (K-DUR,KLOR-CON) 20  MEQ tablet Take 20 mEq by mouth 2 (two) times daily.  04/29/15  Yes [provider]  pravastatin (PRAVACHOL) 20 MG tablet Take 20 mg by mouth every evening.  04/29/15  Yes [provider]  loperamide (IMODIUM A-D) 2 MG tablet Take 2 mg by mouth 4 (four) times daily as needed for diarrhea or loose stools. Reported on 03/28/2016    [provider]    Physical Exam: Vitals:   05/14/17 1946 05/14/17 1947 05/15/17 0057  BP: (!) 148/98  (!) 144/91  Pulse: 92  76  Resp: 18  18  Temp: 98.7 F (37.1 C)  99.2 F (37.3 C)  TempSrc: Oral  Oral  SpO2: 99%  98%  Weight:  95.3 kg (210 lb)   Height:  5\' 8"  (1.727 m)     Constitutional: NAD, calm, comfortable Eyes: PERRL, lids and conjunctivae normal ENMT: Mucous membranes are moist. Posterior pharynx clear  of any exudate or lesions.Normal dentition.  Neck: normal, supple, no masses, no thyromegaly Respiratory: clear to auscultation bilaterally, no wheezing, no crackles. Normal respiratory effort. No accessory muscle use.  Cardiovascular: Regular rate and rhythm, no murmurs / rubs / gallops. No extremity edema. 2+ pedal pulses. No carotid bruits.  Abdomen: no tenderness, no masses palpated. No hepatosplenomegaly. Bowel sounds positive.  Musculoskeletal: no clubbing / cyanosis. No joint deformity upper and lower extremities. Good ROM, no contractures. Normal muscle tone.  Skin: no rashes, lesions, ulcers. No induration Neurologic: CN 2-12 grossly intact. Sensation intact, DTR normal. Strength 5/5 in all 4.  Psychiatric: Normal judgment and insight. Alert and oriented x 3. Normal mood.    Labs on Admission: I have personally reviewed following labs and imaging studies  CBC:  Recent Labs Lab 05/14/17 2106  WBC 17.9*  HGB 13.9  HCT 40.5  MCV 89.4  PLT 938*   Basic Metabolic Panel:  Recent Labs Lab 05/14/17 2106  NA 135  K 4.2  CL 97*  CO2 27  GLUCOSE 200*  BUN 19  CREATININE 1.06*  CALCIUM 9.1    GFR: Estimated Creatinine Clearance: 63 mL/min (A) (by C-G formula based on SCr of 1.06 mg/dL (H)). Liver Function Tests:  Recent Labs Lab 05/14/17 2106  AST 28  ALT 22  ALKPHOS 108  BILITOT 0.5  PROT 7.4  ALBUMIN 3.7    Recent Labs Lab 05/14/17 2106  LIPASE 31   No results for input(s): AMMONIA in the last 168 hours. Coagulation Profile: No results for input(s): INR, PROTIME in the last 168 hours. Cardiac Enzymes: No results for input(s): CKTOTAL, CKMB, CKMBINDEX, TROPONINI in the last 168 hours. BNP (last 3 results) No results for input(s): PROBNP in the last 8760 hours. HbA1C: No results for input(s): HGBA1C in the last 72 hours. CBG: No results for input(s): GLUCAP in the last 168 hours. Lipid Profile: No results for input(s): CHOL, HDL, LDLCALC, TRIG, CHOLHDL, LDLDIRECT in the last 72 hours. Thyroid Function Tests: No results for input(s): TSH, T4TOTAL, FREET4, T3FREE, THYROIDAB in the last 72 hours. Anemia Panel: No results for input(s): VITAMINB12, FOLATE, FERRITIN, TIBC, IRON, RETICCTPCT in the last 72 hours. Urine analysis:    Component Value Date/Time   COLORURINE AMBER (A) 05/15/2017 0230   APPEARANCEUR CLEAR 05/15/2017 0230   LABSPEC 1.028 05/15/2017 0230   LABSPEC 1.010 08/17/2015 1542   PHURINE 5.0 05/15/2017 0230   GLUCOSEU NEGATIVE 05/15/2017 0230   GLUCOSEU 100 08/17/2015 1542   HGBUR NEGATIVE 05/15/2017 0230   BILIRUBINUR NEGATIVE 05/15/2017 0230   BILIRUBINUR Negative 08/17/2015 1542   KETONESUR NEGATIVE 05/15/2017 0230   PROTEINUR 30 (A) 05/15/2017 0230   UROBILINOGEN 1.0 08/26/2015 0725   UROBILINOGEN 0.2 08/17/2015 1542   NITRITE NEGATIVE 05/15/2017 0230   LEUKOCYTESUR NEGATIVE 05/15/2017 0230   LEUKOCYTESUR Trace 08/17/2015 1542    Radiological Exams on Admission: Ct Abdomen Pelvis Wo Contrast  Result Date: 05/15/2017 CLINICAL DATA:  Abdominal pain and constipation EXAM: CT ABDOMEN AND PELVIS WITHOUT CONTRAST TECHNIQUE:  Multidetector CT imaging of the abdomen and pelvis was performed following the standard protocol without IV contrast. COMPARISON:  05/05/2017, 05/03/2017 FINDINGS: Lower chest: Lung bases demonstrate no acute consolidation or effusion. There is cardiomegaly. Small pericardial effusion. Coronary artery calcification. Hiatal hernia with distal esophageal thickening. Hepatobiliary: No calcified gallstones. No biliary dilatation or focal hepatic abnormality. Pancreas: Unremarkable. No pancreatic ductal dilatation or surrounding inflammatory changes. Spleen: Normal in size without focal abnormality. Adrenals/Urinary Tract: Adrenal glands are  within normal limits. Atrophic and scarred left kidney. No hydronephrosis. The bladder is unremarkable. Stomach/Bowel: Marked enlargement of the stomach. Multiple loops of dilated, fluid-filled small bowel with slight increased distension since the previous examination. Findings consistent with mechanical bowel obstruction. No colon wall thickening. Prior appendectomy. Vascular/Lymphatic: Aortic atherosclerosis. No enlarged abdominal or pelvic lymph nodes. Reproductive: Suspect that there is thickening of the endometrial stripe, measuring 19 mm on sagittal views. No adnexal masses. Other: New or increased free fluid in the pelvis and around the liver. No free air. Small pockets of soft tissue gas in the left gluteal region. Musculoskeletal: No acute or suspicious bone lesion. IMPRESSION: 1. Findings again consistent with mechanical small bowel obstruction, the stomach is markedly enlarged. Overall increased dilatation of the small bowel loops since the previous study, consistent with worsening obstruction. No free air, however increasing free fluid in the abdomen and pelvis. 2. Cardiomegaly with small pericardial effusion. 3. Suspect thickening of the endometrial stripe, this may be correlated with pelvic ultrasound 4. Electronically Signed   By: Donavan Foil M.D.   On: 05/15/2017  02:44   Dg Chest 1 View  Result Date: 05/15/2017 CLINICAL DATA:  Assess position of right-sided chest port. Initial encounter. EXAM: CHEST 1 VIEW COMPARISON:  PET/CT performed 02/02/2017 FINDINGS: The patient's right-sided chest port is noted ending about the distal SVC. An enteric tube is noted extending below the diaphragm. Mild peribronchial thickening is noted. No pleural effusion or pneumothorax is seen. The cardiomediastinal silhouette is borderline normal in size. No acute osseous abnormalities are identified. IMPRESSION: 1. Right-sided chest port noted ending about the distal SVC. 2. Mild peribronchial thickening noted.  Lungs otherwise clear. Electronically Signed   By: Garald Balding M.D.   On: 05/15/2017 03:41   Dg Abdomen 1 View  Result Date: 05/15/2017 CLINICAL DATA:  NG tube placement EXAM: ABDOMEN - 1 VIEW COMPARISON:  05/15/2017 FINDINGS: Esophageal tube tip overlies the mid stomach. Multiple loops of dilated small bowel consistent with a bowel obstruction. IMPRESSION: Esophageal tube tip overlies the mid stomach. Electronically Signed   By: Donavan Foil M.D.   On: 05/15/2017 03:36    EKG: Independently reviewed.  Assessment/Plan Principal Problem:   SBO (small bowel obstruction) (HCC) Active Problems:   Cervical cancer, FIGO stage IIB (HCC)   Carcinoma of appendix (HCC)   HTN (hypertension)    1. SBO - 1. NGT in place 2. IVF 3. NPO except sips with meds 4. Gen surg consult in AM 5. Morphine PRN pain 2. HTN - continue home BP meds, but will hold diuretic HCTZ 3. Stage 2B cervical cancer, stage 2C appendix cancer - 1. Believed to be in remission, chemo / radiation for cervical cancer, appendectomy for appendix cancer 2. With the exception of a RUL pulm nodule that is stable on repeat imaging that oncology is watching (See Dr. Serita Grit 5/25 office note). 3. Believe that the cervical cancer / endometrial carcinoma is what they are seeing on the CT scan with regards to  "thickening of endometrial stripe" 1. Will hold off on ordering further [place] at this time given known diagnosis, chemotherapy in [place], follows with Dr. Denman George, etc.  DVT prophylaxis: Lovenox Code Status: Full Family Communication: No family in room Disposition Plan: Home after admit Consults called: EDP spoke with Dr. Lucia Gaskins, gen surg to see in AM Admission status: Admit to inpatient   [place] Pager [phone]  If 7AM-7PM, please contact day team taking care of patient [email or web address]  Password [email or web address]  05/15/2017, 3:47 AM

## 2017-05-15 NOTE — Progress Notes (Signed)
NG tube fell out. Spoke with Jackson Latino PA about reinserting. She said no due to low output throughout this day. Patient is NPO with chips and sips.

## 2017-05-16 ENCOUNTER — Inpatient Hospital Stay (HOSPITAL_COMMUNITY): Payer: Medicare Other

## 2017-05-16 DIAGNOSIS — C181 Malignant neoplasm of appendix: Secondary | ICD-10-CM

## 2017-05-16 DIAGNOSIS — C539 Malignant neoplasm of cervix uteri, unspecified: Secondary | ICD-10-CM

## 2017-05-16 DIAGNOSIS — I10 Essential (primary) hypertension: Secondary | ICD-10-CM

## 2017-05-16 DIAGNOSIS — K56609 Unspecified intestinal obstruction, unspecified as to partial versus complete obstruction: Secondary | ICD-10-CM

## 2017-05-16 LAB — COMPREHENSIVE METABOLIC PANEL
ALBUMIN: 2.8 g/dL — AB (ref 3.5–5.0)
ALK PHOS: 85 U/L (ref 38–126)
ALT: 16 U/L (ref 14–54)
ANION GAP: 8 (ref 5–15)
AST: 18 U/L (ref 15–41)
BILIRUBIN TOTAL: 0.5 mg/dL (ref 0.3–1.2)
BUN: 22 mg/dL — ABNORMAL HIGH (ref 6–20)
CO2: 29 mmol/L (ref 22–32)
Calcium: 8.2 mg/dL — ABNORMAL LOW (ref 8.9–10.3)
Chloride: 101 mmol/L (ref 101–111)
Creatinine, Ser: 0.83 mg/dL (ref 0.44–1.00)
GFR calc Af Amer: >60 mL/min (ref >60)
GLUCOSE: 131 mg/dL — AB (ref 65–99)
Potassium: 3.9 mmol/L (ref 3.5–5.1)
Sodium: 138 mmol/L (ref 135–145)
TOTAL PROTEIN: 5.7 g/dL — AB (ref 6.5–8.1)

## 2017-05-16 LAB — CBC WITH DIFFERENTIAL/PLATELET
BASOS ABS: 0 10*3/uL (ref 0.0–0.1)
Basophils Relative: 0 %
EOS PCT: 1 %
Eosinophils Absolute: 0.1 10*3/uL (ref 0.0–0.7)
HEMATOCRIT: 32.2 % — AB (ref 36.0–46.0)
Hemoglobin: 10.6 g/dL — ABNORMAL LOW (ref 12.0–15.0)
Lymphocytes Relative: 12 %
Lymphs Abs: 1 10*3/uL (ref 0.7–4.0)
MCH: 29.8 pg (ref 26.0–34.0)
MCHC: 32.9 g/dL (ref 30.0–36.0)
MCV: 90.4 fL (ref 78.0–100.0)
MONO ABS: 0.5 10*3/uL (ref 0.1–1.0)
Monocytes Relative: 7 %
NEUTROS ABS: 6.4 10*3/uL (ref 1.7–7.7)
Neutrophils Relative %: 80 %
PLATELETS: 310 10*3/uL (ref 150–400)
RBC: 3.56 MIL/uL — ABNORMAL LOW (ref 3.87–5.11)
RDW: 14.1 % (ref 11.5–15.5)
WBC: 8 10*3/uL (ref 4.0–10.5)

## 2017-05-16 LAB — PROTIME-INR
INR: 1.09
PROTHROMBIN TIME: 14.1 s (ref 11.4–15.2)

## 2017-05-16 LAB — LACTIC ACID, PLASMA: Lactic Acid, Venous: 0.9 mmol/L (ref 0.5–1.9)

## 2017-05-16 LAB — MAGNESIUM: Magnesium: 1.7 mg/dL (ref 1.7–2.4)

## 2017-05-16 NOTE — Progress Notes (Signed)
PROGRESS NOTE    Lori [name]  UYQ:[id number] DOB: January 07, 1951 DOA: 05/14/2017 PCP: [name], MD   Chief Complaint  Patient presents with  . Abdominal Pain  . Constipation    Brief Narrative:  HPI on 05/15/2017 by Dr. Jennette Kettle Lori [name] is a 66 y.o. female with medical history significant of cervical cancer and endometrial carcinoma stage 2B with complete response to chemo/radiation, during follow up PET scan in 2017 showed activity in appendix.  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  No additional systemic chemo / radiation done according to her April oncology office note.  Patient was admitted about 2 weeks ago to [facility] with SBO.  This resolved after medical therapy after a couple of days and patient was discharged.  She had recurrence of generalized abd pain, distention, last night.  Last BM was yesterday.  Says symptoms are like SBO again.  Pain is sharp and constant in nature.  Mild nausea but no emesis.  Nothing makes better or worse.  Nothing tried for symptoms PTA. Assessment & Plan   Small bowel obstruction -CT abdomen and pelvis showed small bowel obstruction -Gen. surgery consulted and appreciated -Patient did have NG tube however this out yesterday evening -Denies current abdominal pain, nausea or vomiting. Has not had a bowel movement. -Started on clear liquids today  Essential hypertension -Continue metoprolol, clonidine patch  Stage IIB cervical cancer, stage IIc appendiceal cancer -s/p chemo/radiation, thought to be in remission -CTM and pelvis showed thickening of endometrial stripe  RUL pulmonary nodule  -oncology following  DVT Prophylaxis  Lovenox  Code Status: FULL  Family Communication: None at bedside  Disposition Plan: Admitted. Pending further recommendations from general surgery and improvement in SBO  Consultants General surery  Procedures    none  Antibiotics   Anti-infectives    None      Subjective:   Lori [name] seen and examined today. Patient feels her abdominal pain has improved. Denies any bowel movement. Does endorse gas passage. Denies chest pain, shortness of breath, current nausea or vomiting.  Objective:   Vitals:   05/15/17 2116 05/16/17 0041 05/16/17 0529 05/16/17 1320  BP: (!) 159/98 124/73 129/68 (!) 150/69  Pulse: 78 84 79 66  Resp: 17  17 16   Temp: 99.3 F (37.4 C)  99.3 F (37.4 C) 98.5 F (36.9 C)  TempSrc: Oral  Oral Oral  SpO2: 99%  98% 99%  Weight:      Height:        Intake/Output Summary (Last 24 hours) at 05/16/17 1430 Last data filed at 05/16/17 1000  Gross per 24 hour  Intake          3553.33 ml  Output                0 ml  Net          3553.33 ml   Filed Weights   05/14/17 1947  Weight: 95.3 kg (210 lb)    Exam  General: Well developed, well nourished, NAD, appears stated age  HEENT: NCAT, mucous membranes moist.  Cardiovascular: S1 S2 auscultated, no rubs, murmurs or gallops. Regular rate and rhythm.  Respiratory: Clear to auscultation bilaterally with equal chest rise  Abdomen: Soft, obese, nontender, nondistended, + bowel sounds  Extremities: warm dry without cyanosis clubbing or edema  Neuro: AAOx3, nonfocal  Psych: Normal affect and demeanor with intact judgement and insight   Data Reviewed:  I have personally reviewed following labs and imaging studies  CBC:  Recent Labs Lab 05/14/17 2106 05/15/17 0700 05/16/17 0423  WBC 17.9* 10.5 8.0  NEUTROABS  --   --  6.4  HGB 13.9 11.0* 10.6*  HCT 40.5 33.0* 32.2*  MCV 89.4 89.2 90.4  PLT 442* 325 034   Basic Metabolic Panel:  Recent Labs Lab 05/14/17 2106 05/15/17 0700 05/16/17 0423  NA 135 137 138  K 4.2 3.7 3.9  CL 97* 98* 101  CO2 27 32 29  GLUCOSE 200* 142* 131*  BUN 19 21* 22*  CREATININE 1.06* 0.77 0.83  CALCIUM 9.1 8.3* 8.2*  MG  --   --  1.7   GFR: Estimated Creatinine  Clearance: 80.5 mL/min (by C-G formula based on SCr of 0.83 mg/dL). Liver Function Tests:  Recent Labs Lab 05/14/17 2106 05/16/17 0423  AST 28 18  ALT 22 16  ALKPHOS 108 85  BILITOT 0.5 0.5  PROT 7.4 5.7*  ALBUMIN 3.7 2.8*    Recent Labs Lab 05/14/17 2106  LIPASE 31   No results for input(s): AMMONIA in the last 168 hours. Coagulation Profile:  Recent Labs Lab 05/16/17 0423  INR 1.09   Cardiac Enzymes: No results for input(s): CKTOTAL, CKMB, CKMBINDEX, TROPONINI in the last 168 hours. BNP (last 3 results) No results for input(s): PROBNP in the last 8760 hours. HbA1C: No results for input(s): HGBA1C in the last 72 hours. CBG: No results for input(s): GLUCAP in the last 168 hours. Lipid Profile: No results for input(s): CHOL, HDL, LDLCALC, TRIG, CHOLHDL, LDLDIRECT in the last 72 hours. Thyroid Function Tests: No results for input(s): TSH, T4TOTAL, FREET4, T3FREE, THYROIDAB in the last 72 hours. Anemia Panel: No results for input(s): VITAMINB12, FOLATE, FERRITIN, TIBC, IRON, RETICCTPCT in the last 72 hours. Urine analysis:    Component Value Date/Time   COLORURINE AMBER (A) 05/15/2017 0230   APPEARANCEUR CLEAR 05/15/2017 0230   LABSPEC 1.028 05/15/2017 0230   LABSPEC 1.010 08/17/2015 1542   PHURINE 5.0 05/15/2017 0230   GLUCOSEU NEGATIVE 05/15/2017 0230   GLUCOSEU 100 08/17/2015 1542   HGBUR NEGATIVE 05/15/2017 0230   BILIRUBINUR NEGATIVE 05/15/2017 0230   BILIRUBINUR Negative 08/17/2015 1542   KETONESUR NEGATIVE 05/15/2017 0230   PROTEINUR 30 (A) 05/15/2017 0230   UROBILINOGEN 1.0 08/26/2015 0725   UROBILINOGEN 0.2 08/17/2015 1542   NITRITE NEGATIVE 05/15/2017 0230   LEUKOCYTESUR NEGATIVE 05/15/2017 0230   LEUKOCYTESUR Trace 08/17/2015 1542   Sepsis Labs: @LABRCNTIP (procalcitonin:4,lacticidven:4)  )No results found for this or any previous visit (from the past 240 hour(s)).    Radiology Studies: Ct Abdomen Pelvis Wo Contrast  Result Date:  05/15/2017 CLINICAL DATA:  Abdominal pain and constipation EXAM: CT ABDOMEN AND PELVIS WITHOUT CONTRAST TECHNIQUE: Multidetector CT imaging of the abdomen and pelvis was performed following the standard protocol without IV contrast. COMPARISON:  05/05/2017, 05/03/2017 FINDINGS: Lower chest: Lung bases demonstrate no acute consolidation or effusion. There is cardiomegaly. Small pericardial effusion. Coronary artery calcification. Hiatal hernia with distal esophageal thickening. Hepatobiliary: No calcified gallstones. No biliary dilatation or focal hepatic abnormality. Pancreas: Unremarkable. No pancreatic ductal dilatation or surrounding inflammatory changes. Spleen: Normal in size without focal abnormality. Adrenals/Urinary Tract: Adrenal glands are within normal limits. Atrophic and scarred left kidney. No hydronephrosis. The bladder is unremarkable. Stomach/Bowel: Marked enlargement of the stomach. Multiple loops of dilated, fluid-filled small bowel with slight increased distension since the previous examination. Findings consistent with mechanical bowel obstruction. No colon wall thickening. Prior appendectomy. Vascular/Lymphatic:  Aortic atherosclerosis. No enlarged abdominal or pelvic lymph nodes. Reproductive: Suspect that there is thickening of the endometrial stripe, measuring 19 mm on sagittal views. No adnexal masses. Other: New or increased free fluid in the pelvis and around the liver. No free air. Small pockets of soft tissue gas in the left gluteal region. Musculoskeletal: No acute or suspicious bone lesion. IMPRESSION: 1. Findings again consistent with mechanical small bowel obstruction, the stomach is markedly enlarged. Overall increased dilatation of the small bowel loops since the previous study, consistent with worsening obstruction. No free air, however increasing free fluid in the abdomen and pelvis. 2. Cardiomegaly with small pericardial effusion. 3. Suspect thickening of the endometrial stripe,  this may be correlated with pelvic ultrasound 4. Electronically Signed   By: Donavan Foil M.D.   On: 05/15/2017 02:44   Dg Chest 1 View  Result Date: 05/15/2017 CLINICAL DATA:  Assess position of right-sided chest port. Initial encounter. EXAM: CHEST 1 VIEW COMPARISON:  PET/CT performed 02/02/2017 FINDINGS: The patient's right-sided chest port is noted ending about the distal SVC. An enteric tube is noted extending below the diaphragm. Mild peribronchial thickening is noted. No pleural effusion or pneumothorax is seen. The cardiomediastinal silhouette is borderline normal in size. No acute osseous abnormalities are identified. IMPRESSION: 1. Right-sided chest port noted ending about the distal SVC. 2. Mild peribronchial thickening noted.  Lungs otherwise clear. Electronically Signed   By: Garald Balding M.D.   On: 05/15/2017 03:41   Dg Abdomen 1 View  Result Date: 05/15/2017 CLINICAL DATA:  NG tube placement EXAM: ABDOMEN - 1 VIEW COMPARISON:  05/15/2017 FINDINGS: Esophageal tube tip overlies the mid stomach. Multiple loops of dilated small bowel consistent with a bowel obstruction. IMPRESSION: Esophageal tube tip overlies the mid stomach. Electronically Signed   By: Donavan Foil M.D.   On: 05/15/2017 03:36     Scheduled Meds: . carbamazepine  100 mg Oral TID  . cloNIDine  0.2 mg Transdermal Weekly  . enoxaparin (LOVENOX) injection  40 mg Subcutaneous Q24H  . metoprolol tartrate  5 mg Intravenous Q6H   Continuous Infusions: . sodium chloride 125 mL/hr at 05/16/17 1039     LOS: 1 day   Time Spent in minutes   30 minutes  Kendallyn Lippold D.O. on 05/16/2017 at 2:30 PM  Between 7am to 7pm - Pager - [phone]  After 7pm go to [email or web address] - password [email or web address]  And look for the night coverage person covering for me after hours  [facility] Office  [phone]

## 2017-05-16 NOTE — Progress Notes (Signed)
[facility]/Trauma Progress Note      Subjective: CC; abdominal pain  Pt states no nausea or vomiting since she pulled out the NG tube yesterday. She states he has had flatus but no BM. No acute events overnight.   Objective: Vital signs in last 24 hours: Temp:  [98.5 F (36.9 C)-99.3 F (37.4 C)] 99.3 F (37.4 C) (06/13 0529) Pulse Rate:  [74-84] 79 (06/13 0529) Resp:  [17-18] 17 (06/13 0529) BP: (124-159)/(68-98) 129/68 (06/13 0529) SpO2:  [97 %-99 %] 98 % (06/13 0529) Last BM Date: 05/14/17  Intake/Output from previous day: 06/12 0701 - 06/13 0700 In: 3193.3 [P.O.:10; I.V.:3183.3] Out: 200 [Urine:200] Intake/Output this shift: No intake/output data recorded.  PE: Gen:  Alert, NAD, pleasant, cooperative, well appearing, resting comfortably Card:  RRR, normal S1 and S2 Pulm:  CTA, no W/R/R, effort normal Abd: Soft, not distended, +BS, no TTP, no hernia appreciated Skin: no rashes noted, warm and dry  Lab Results:   Recent Labs  05/15/17 0700 05/16/17 0423  WBC 10.5 8.0  HGB 11.0* 10.6*  HCT 33.0* 32.2*  PLT 325 310   BMET  Recent Labs  05/15/17 0700 05/16/17 0423  NA 137 138  K 3.7 3.9  CL 98* 101  CO2 32 29  GLUCOSE 142* 131*  BUN 21* 22*  CREATININE 0.77 0.83  CALCIUM 8.3* 8.2*   PT/INR  Recent Labs  05/16/17 0423  LABPROT 14.1  INR 1.09   CMP     Component Value Date/Time   NA 138 05/16/2017 0423   K 3.9 05/16/2017 0423   CL 101 05/16/2017 0423   CO2 29 05/16/2017 0423   GLUCOSE 131 (H) 05/16/2017 0423   BUN 22 (H) 05/16/2017 0423   CREATININE 0.83 05/16/2017 0423   CALCIUM 8.2 (L) 05/16/2017 0423   PROT 5.7 (L) 05/16/2017 0423   ALBUMIN 2.8 (L) 05/16/2017 0423   AST 18 05/16/2017 0423   ALT 16 05/16/2017 0423   ALKPHOS 85 05/16/2017 0423   BILITOT 0.5 05/16/2017 0423   GFRNONAA >60 05/16/2017 0423   GFRAA >60 05/16/2017 0423   Lipase     Component Value Date/Time   LIPASE 31 05/14/2017 2106     Studies/Results: Ct Abdomen Pelvis Wo Contrast  Result Date: 05/15/2017 CLINICAL DATA:  Abdominal pain and constipation EXAM: CT ABDOMEN AND PELVIS WITHOUT CONTRAST TECHNIQUE: Multidetector CT imaging of the abdomen and pelvis was performed following the standard protocol without IV contrast. COMPARISON:  05/05/2017, 05/03/2017 FINDINGS: Lower chest: Lung bases demonstrate no acute consolidation or effusion. There is cardiomegaly. Small pericardial effusion. Coronary artery calcification. Hiatal hernia with distal esophageal thickening. Hepatobiliary: No calcified gallstones. No biliary dilatation or focal hepatic abnormality. Pancreas: Unremarkable. No pancreatic ductal dilatation or surrounding inflammatory changes. Spleen: Normal in size without focal abnormality. Adrenals/Urinary Tract: Adrenal glands are within normal limits. Atrophic and scarred left kidney. No hydronephrosis. The bladder is unremarkable. Stomach/Bowel: Marked enlargement of the stomach. Multiple loops of dilated, fluid-filled small bowel with slight increased distension since the previous examination. Findings consistent with mechanical bowel obstruction. No colon wall thickening. Prior appendectomy. Vascular/Lymphatic: Aortic atherosclerosis. No enlarged abdominal or pelvic lymph nodes. Reproductive: Suspect that there is thickening of the endometrial stripe, measuring 19 mm on sagittal views. No adnexal masses. Other: New or increased free fluid in the pelvis and around the liver. No free air. Small pockets of soft tissue gas in the left gluteal region. Musculoskeletal: No acute or suspicious bone lesion. IMPRESSION: 1. Findings again  consistent with mechanical small bowel obstruction, the stomach is markedly enlarged. Overall increased dilatation of the small bowel loops since the previous study, consistent with worsening obstruction. No free air, however increasing free fluid in the abdomen and pelvis. 2. Cardiomegaly with small  pericardial effusion. 3. Suspect thickening of the endometrial stripe, this may be correlated with pelvic ultrasound 4. Electronically Signed   By: Donavan Foil M.D.   On: 05/15/2017 02:44   Dg Chest 1 View  Result Date: 05/15/2017 CLINICAL DATA:  Assess position of right-sided chest port. Initial encounter. EXAM: CHEST 1 VIEW COMPARISON:  PET/CT performed 02/02/2017 FINDINGS: The patient's right-sided chest port is noted ending about the distal SVC. An enteric tube is noted extending below the diaphragm. Mild peribronchial thickening is noted. No pleural effusion or pneumothorax is seen. The cardiomediastinal silhouette is borderline normal in size. No acute osseous abnormalities are identified. IMPRESSION: 1. Right-sided chest port noted ending about the distal SVC. 2. Mild peribronchial thickening noted.  Lungs otherwise clear. Electronically Signed   By: Garald Balding M.D.   On: 05/15/2017 03:41   Dg Abdomen 1 View  Result Date: 05/15/2017 CLINICAL DATA:  NG tube placement EXAM: ABDOMEN - 1 VIEW COMPARISON:  05/15/2017 FINDINGS: Esophageal tube tip overlies the mid stomach. Multiple loops of dilated small bowel consistent with a bowel obstruction. IMPRESSION: Esophageal tube tip overlies the mid stomach. Electronically Signed   By: Donavan Foil M.D.   On: 05/15/2017 03:36    Anti-infectives: Anti-infectives    None       Assessment/Plan Hx of IIB endocervical adenocarcinoma s/p chemoradiation  Hx of appendiceal mucinous adenocarcinoma s/p appendectomy. HTN Hx of cerebral aneurysm repair Hx of seizures Type II DM diet controlled  SBO - pt pulled NG tube out yesterday, no vomiting, + flatus, tolerating sips and chips - Cannot do SBO protocol as pt is allergic to contrast  Recommend starting pt on clears. We will continue to follow.   LOS: 1 day    Kalman Drape , [facility] Surgery 05/16/2017, 7:20 AM Pager: [phone] Consults: [phone] Mon-Fri 7:00  am-4:30 pm Sat-Sun 7:00 am-11:30 am

## 2017-05-17 ENCOUNTER — Inpatient Hospital Stay (HOSPITAL_COMMUNITY): Payer: Medicare Other

## 2017-05-17 DIAGNOSIS — Z4659 Encounter for fitting and adjustment of other gastrointestinal appliance and device: Secondary | ICD-10-CM

## 2017-05-17 MED ORDER — PNEUMOCOCCAL VAC POLYVALENT 25 MCG/0.5ML IJ INJ
0.5000 mL | INJECTION | INTRAMUSCULAR | Status: DC
  Filled 2017-05-17: qty 0.5

## 2017-05-17 NOTE — Progress Notes (Addendum)
PROGRESS NOTE    Lori [name]  SPQ:[id number] DOB: 1951/01/23 DOA: 05/14/2017 PCP: [name], MD   Chief Complaint  Patient presents with  . Abdominal Pain  . Constipation    Brief Narrative:  HPI on 05/15/2017 by Dr. Jennette Kettle Lori [name] is a 66 y.o. female with medical history significant of cervical cancer and endometrial carcinoma stage 2B with complete response to chemo/radiation, during follow up PET scan in 2017 showed activity in appendix.  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  No additional systemic chemo / radiation done according to her April oncology office note.  Patient was admitted about 2 weeks ago to [facility] with SBO.  This resolved after medical therapy after a couple of days and patient was discharged.  She had recurrence of generalized abd pain, distention, last night.  Last BM was yesterday.  Says symptoms are like SBO again.  Pain is sharp and constant in nature.  Mild nausea but no emesis.  Nothing makes better or worse.  Nothing tried for symptoms PTA. Assessment & Plan   Small bowel obstruction -CT abdomen and pelvis showed small bowel obstruction -Gen. surgery consulted and appreciated -Denies current abdominal pain, nausea or vomiting. Has not had a bowel movement. -not sure what prompted repeat placement of NG tube overnight, however after placement, patient removed it again this morning -endorses flatus this morning, no nausea or vomiting, no abdominal pain -Abd xray today still shows SBO, read as high grade mid to distal SBO, no perf -restarted on clear liquids today  Essential hypertension -Continue metoprolol, clonidine patch  Stage IIB cervical cancer, stage IIc appendiceal cancer -s/p chemo/radiation, thought to be in remission -CTM and pelvis showed thickening of endometrial stripe  RUL pulmonary nodule  -oncology following  Chronic normocytic  Anemia  -upon review of Epic, baseline hemoglobin around 10-11 -Currently 10.6 -Continue to monitor CBC  DVT Prophylaxis  Lovenox  Code Status: FULL  Family Communication: None at bedside  Disposition Plan: Admitted. Pending further recommendations from general surgery and improvement in SBO  Consultants General surgery  Procedures  none  Antibiotics   Anti-infectives    None      Subjective:   Lori [name] seen and examined today. Patient denies current abdominal pain, nausea, or vomiting. States she had some pain overnight, without nausea. NG tube placed overnight, and patient removed it this morning. Denies chest pain, shortness of breath, dizziness, headache.   Objective:   Vitals:   05/16/17 1914 05/16/17 2115 05/17/17 0006 05/17/17 0523  BP: (!) 154/91 140/68 (!) 154/94 132/77  Pulse: 76 86 90 84  Resp: 16 18 18 18   Temp: 99.9 F (37.7 C) 99.5 F (37.5 C) 99.3 F (37.4 C) 98.3 F (36.8 C)  TempSrc: Oral Oral Oral Oral  SpO2: 99% 98% 98% 99%  Weight:    95.5 kg (210 lb 8.6 oz)  Height:        Intake/Output Summary (Last 24 hours) at 05/17/17 1319 Last data filed at 05/17/17 1100  Gross per 24 hour  Intake             2245 ml  Output              800 ml  Net             1445 ml   Filed Weights   05/14/17 1947 05/17/17 0523  Weight: 95.3 kg (210 lb) 95.5 kg (210  lb 8.6 oz)   Exam  General: Well developed, well nourished, NAD, appears stated age  HEENT: NCAT, mucous membranes moist.   Cardiovascular: S1 S2 auscultated, no rubs, murmurs or gallops. Regular rate and rhythm.  Respiratory: Clear to auscultation bilaterally with equal chest rise  Abdomen: Soft, obese, nontender, nondistended, + bowel sounds  Extremities: warm dry without cyanosis clubbing or edema  Neuro: AAOx3, nonfocal  Psych: Appropriate mood and affect  Data Reviewed: I have personally reviewed following labs and imaging studies  CBC:  Recent Labs Lab  05/14/17 2106 05/15/17 0700 05/16/17 0423  WBC 17.9* 10.5 8.0  NEUTROABS  --   --  6.4  HGB 13.9 11.0* 10.6*  HCT 40.5 33.0* 32.2*  MCV 89.4 89.2 90.4  PLT 442* 325 938   Basic Metabolic Panel:  Recent Labs Lab 05/14/17 2106 05/15/17 0700 05/16/17 0423  NA 135 137 138  K 4.2 3.7 3.9  CL 97* 98* 101  CO2 27 32 29  GLUCOSE 200* 142* 131*  BUN 19 21* 22*  CREATININE 1.06* 0.77 0.83  CALCIUM 9.1 8.3* 8.2*  MG  --   --  1.7   GFR: Estimated Creatinine Clearance: 80.5 mL/min (by C-G formula based on SCr of 0.83 mg/dL). Liver Function Tests:  Recent Labs Lab 05/14/17 2106 05/16/17 0423  AST 28 18  ALT 22 16  ALKPHOS 108 85  BILITOT 0.5 0.5  PROT 7.4 5.7*  ALBUMIN 3.7 2.8*    Recent Labs Lab 05/14/17 2106  LIPASE 31   No results for input(s): AMMONIA in the last 168 hours. Coagulation Profile:  Recent Labs Lab 05/16/17 0423  INR 1.09   Cardiac Enzymes: No results for input(s): CKTOTAL, CKMB, CKMBINDEX, TROPONINI in the last 168 hours. BNP (last 3 results) No results for input(s): PROBNP in the last 8760 hours. HbA1C: No results for input(s): HGBA1C in the last 72 hours. CBG: No results for input(s): GLUCAP in the last 168 hours. Lipid Profile: No results for input(s): CHOL, HDL, LDLCALC, TRIG, CHOLHDL, LDLDIRECT in the last 72 hours. Thyroid Function Tests: No results for input(s): TSH, T4TOTAL, FREET4, T3FREE, THYROIDAB in the last 72 hours. Anemia Panel: No results for input(s): VITAMINB12, FOLATE, FERRITIN, TIBC, IRON, RETICCTPCT in the last 72 hours. Urine analysis:    Component Value Date/Time   COLORURINE AMBER (A) 05/15/2017 0230   APPEARANCEUR CLEAR 05/15/2017 0230   LABSPEC 1.028 05/15/2017 0230   LABSPEC 1.010 08/17/2015 1542   PHURINE 5.0 05/15/2017 0230   GLUCOSEU NEGATIVE 05/15/2017 0230   GLUCOSEU 100 08/17/2015 1542   HGBUR NEGATIVE 05/15/2017 0230   BILIRUBINUR NEGATIVE 05/15/2017 0230   BILIRUBINUR Negative 08/17/2015 1542    KETONESUR NEGATIVE 05/15/2017 0230   PROTEINUR 30 (A) 05/15/2017 0230   UROBILINOGEN 1.0 08/26/2015 0725   UROBILINOGEN 0.2 08/17/2015 1542   NITRITE NEGATIVE 05/15/2017 0230   LEUKOCYTESUR NEGATIVE 05/15/2017 0230   LEUKOCYTESUR Trace 08/17/2015 1542   Sepsis Labs: @LABRCNTIP (procalcitonin:4,lacticidven:4)  )No results found for this or any previous visit (from the past 240 hour(s)).    Radiology Studies: Dg Abd 1 View  Result Date: 05/17/2017 CLINICAL DATA:  Follow-up of small bowel obstruction EXAM: ABDOMEN - 1 VIEW COMPARISON:  Abdominal radiograph of May 16, 2017 FINDINGS: There remain loops of moderately distended gas-filled small bowel in the mid abdomen. There is some gas within the ascending and transverse colon. No free extraluminal gas collections are observed. There is a tiny amount of gas in the rectum. The esophagogastric tube has  been removed. IMPRESSION: Findings compatible with a fairly high-grade mid to distal small bowel obstruction. No evidence of perforation. Electronically Signed   By: David  Martinique M.D.   On: 05/17/2017 08:38   Dg Abd 1 View  Result Date: 05/16/2017 CLINICAL DATA:  NG tube placement. EXAM: ABDOMEN - 1 VIEW COMPARISON:  05/15/2017 FINDINGS: An NG tube is identified with tip overlying the proximal stomach. Dilated small bowel loops are again noted. IMPRESSION: NG tube with tip overlying the proximal stomach. Unchanged dilated small bowel loops. Electronically Signed   By: Margarette Canada M.D.   On: 05/16/2017 19:03     Scheduled Meds: . carbamazepine  100 mg Oral TID  . cloNIDine  0.2 mg Transdermal Weekly  . enoxaparin (LOVENOX) injection  40 mg Subcutaneous Q24H  . metoprolol tartrate  5 mg Intravenous Q6H  . [START ON 05/18/2017] pneumococcal 23 valent vaccine  0.5 mL Intramuscular Tomorrow-1000   Continuous Infusions: . sodium chloride 125 mL/hr at 05/17/17 0206     LOS: 2 days   Time Spent in minutes   30 minutes  Matylda Fehring D.O. on  05/17/2017 at 1:19 PM  Between 7am to 7pm - Pager - [phone]  After 7pm go to [email or web address] - password [email or web address]  And look for the night coverage person covering for me after hours  [facility] Office  [phone]

## 2017-05-17 NOTE — Progress Notes (Signed)
[facility]/Trauma Progress Note      Subjective:  CC: SBO  Pt states she pulled her NG tube out last night cause it was hurting her nose. She states lots of flatus this AM. No nausea or vomiting. She denies abdominal pain.   Objective: Vital signs in last 24 hours: Temp:  [98.3 F (36.8 C)-99.9 F (37.7 C)] 98.3 F (36.8 C) (06/14 0523) Pulse Rate:  [66-90] 84 (06/14 0523) Resp:  [16-18] 18 (06/14 0523) BP: (132-154)/(68-94) 132/77 (06/14 0523) SpO2:  [98 %-99 %] 99 % (06/14 0523) Weight:  [210 lb 8.6 oz (95.5 kg)] 210 lb 8.6 oz (95.5 kg) (06/14 0523) Last BM Date: 05/13/17  Intake/Output from previous day: 06/13 0701 - 06/14 0700 In: 1860 [P.O.:360; I.V.:1500] Out: 800 [Urine:300; Emesis/NG output:500] Intake/Output this shift: No intake/output data recorded.  PE: Gen:  Alert, NAD, pleasant, cooperative, well appearing Card:  RRR, normal S1 and S2 Pulm:  rate and effort normal Abd: Soft, not distended, +BS, no TTP, no hernia appreciated Skin: no rashes noted, warm and dry  Lab Results:   Recent Labs  05/15/17 0700 05/16/17 0423  WBC 10.5 8.0  HGB 11.0* 10.6*  HCT 33.0* 32.2*  PLT 325 310   BMET  Recent Labs  05/15/17 0700 05/16/17 0423  NA 137 138  K 3.7 3.9  CL 98* 101  CO2 32 29  GLUCOSE 142* 131*  BUN 21* 22*  CREATININE 0.77 0.83  CALCIUM 8.3* 8.2*   PT/INR  Recent Labs  05/16/17 0423  LABPROT 14.1  INR 1.09   CMP     Component Value Date/Time   NA 138 05/16/2017 0423   K 3.9 05/16/2017 0423   CL 101 05/16/2017 0423   CO2 29 05/16/2017 0423   GLUCOSE 131 (H) 05/16/2017 0423   BUN 22 (H) 05/16/2017 0423   CREATININE 0.83 05/16/2017 0423   CALCIUM 8.2 (L) 05/16/2017 0423   PROT 5.7 (L) 05/16/2017 0423   ALBUMIN 2.8 (L) 05/16/2017 0423   AST 18 05/16/2017 0423   ALT 16 05/16/2017 0423   ALKPHOS 85 05/16/2017 0423   BILITOT 0.5 05/16/2017 0423   GFRNONAA >60 05/16/2017 0423   GFRAA >60 05/16/2017 0423   Lipase      Component Value Date/Time   LIPASE 31 05/14/2017 2106    Studies/Results: Dg Abd 1 View  Result Date: 05/17/2017 CLINICAL DATA:  Follow-up of small bowel obstruction EXAM: ABDOMEN - 1 VIEW COMPARISON:  Abdominal radiograph of May 16, 2017 FINDINGS: There remain loops of moderately distended gas-filled small bowel in the mid abdomen. There is some gas within the ascending and transverse colon. No free extraluminal gas collections are observed. There is a tiny amount of gas in the rectum. The esophagogastric tube has been removed. IMPRESSION: Findings compatible with a fairly high-grade mid to distal small bowel obstruction. No evidence of perforation. Electronically Signed   By: David  Martinique M.D.   On: 05/17/2017 08:38   Dg Abd 1 View  Result Date: 05/16/2017 CLINICAL DATA:  NG tube placement. EXAM: ABDOMEN - 1 VIEW COMPARISON:  05/15/2017 FINDINGS: An NG tube is identified with tip overlying the proximal stomach. Dilated small bowel loops are again noted. IMPRESSION: NG tube with tip overlying the proximal stomach. Unchanged dilated small bowel loops. Electronically Signed   By: Margarette Canada M.D.   On: 05/16/2017 19:03    Anti-infectives: Anti-infectives    None       Assessment/Plan Hx of IIB endocervical adenocarcinoma  s/p chemoradiation  Hx of appendiceal mucinous adenocarcinoma s/p appendectomy. HTN Hx of cerebral aneurysm repair Hx of seizures Type II DM diet controlled  SBO - pt pulled NG tube out for the second time last night, no vomiting, + flatus, tolerating sips and chips - Cannot do SBO protocol as pt is allergic to contrast - abd xray today showed continued loops of dilated small bowel with gas in the colon and rectum.  - pt denies abdominal pain  Recommend starting pt on clears as pt is having flatus. Would advance her slowly. We will continue to follow.     LOS: 2 days    Kalman Drape , [facility] Surgery 05/17/2017, 8:58 AM Pager:  [phone] Consults: [phone] Mon-Fri 7:00 am-4:30 pm Sat-Sun 7:00 am-11:30 am

## 2017-05-18 DIAGNOSIS — D649 Anemia, unspecified: Secondary | ICD-10-CM

## 2017-05-18 DIAGNOSIS — E876 Hypokalemia: Secondary | ICD-10-CM

## 2017-05-18 LAB — CBC
HCT: 29.8 % — ABNORMAL LOW (ref 36.0–46.0)
HEMOGLOBIN: 9.9 g/dL — AB (ref 12.0–15.0)
MCH: 30.2 pg (ref 26.0–34.0)
MCHC: 33.2 g/dL (ref 30.0–36.0)
MCV: 90.9 fL (ref 78.0–100.0)
Platelets: 325 10*3/uL (ref 150–400)
RBC: 3.28 MIL/uL — AB (ref 3.87–5.11)
RDW: 14.1 % (ref 11.5–15.5)
WBC: 6.1 10*3/uL (ref 4.0–10.5)

## 2017-05-18 LAB — BASIC METABOLIC PANEL
Anion gap: 7 (ref 5–15)
BUN: 14 mg/dL (ref 6–20)
CHLORIDE: 106 mmol/L (ref 101–111)
CO2: 25 mmol/L (ref 22–32)
Calcium: 8 mg/dL — ABNORMAL LOW (ref 8.9–10.3)
Creatinine, Ser: 0.77 mg/dL (ref 0.44–1.00)
GFR calc Af Amer: >60 mL/min (ref >60)
GFR calc non Af Amer: >60 mL/min (ref >60)
GLUCOSE: 114 mg/dL — AB (ref 65–99)
POTASSIUM: 3.3 mmol/L — AB (ref 3.5–5.1)
Sodium: 138 mmol/L (ref 135–145)

## 2017-05-18 MED ORDER — MAGNESIUM CHLORIDE 64 MG PO TBEC
1.0000 | DELAYED_RELEASE_TABLET | Freq: Two times a day (BID) | ORAL | Status: DC
  Administered 2017-05-18 – 2017-05-19 (×3): 64 mg via ORAL
  Filled 2017-05-18 (×3): qty 1

## 2017-05-18 MED ORDER — POTASSIUM CHLORIDE CRYS ER 20 MEQ PO TBCR
40.0000 meq | EXTENDED_RELEASE_TABLET | Freq: Once | ORAL | Status: AC
Start: 2017-05-18 — End: 2017-05-18
  Administered 2017-05-18: 40 meq via ORAL
  Filled 2017-05-18: qty 2

## 2017-05-18 NOTE — Final Consult Note (Signed)
Consultant Final Sign-Off Note    Assessment/Final recommendations  Lori [name] is a 66 y.o. female followed by me for SBO. She is tolerating her diet and having bowel movements. No surgical intervention is indicated at this time.     Wound care (if applicable):    Diet at discharge: per primary team but recommend soft diet   Activity at discharge: per primary team   Follow-up appointment:  None required   Pending results:  Unresulted Labs    None       Medication recommendations:   Other recommendations: none    Thank you for allowing [place] to participate in the care of your patient!  Please consult [place] again if you have further needs for your patient.  Lori [name] 05/18/2017 9:50 AM    Subjective   Pt is having multiple bowel movements and no abdominal pain. Tolerating fulls. No nausea or vomiting.   Objective  Vital signs in last 24 hours: Temp:  [98.1 F (36.7 C)-99.5 F (37.5 C)] 98.3 F (36.8 C) (06/15 0452) Pulse Rate:  [72-84] 72 (06/15 0452) Resp:  [18] 18 (06/15 0452) BP: (131-145)/(70-92) 145/76 (06/15 0452) SpO2:  [100 %] 100 % (06/15 0452)  General:  Gen: Alert, NAD, pleasant, cooperative, well appearing Card: RRR, normal S1 and S2 Pulm: rate and effort normal Abd: Soft, not distended,+BS, no TTP, no hernia appreciated Skin: no rashes noted, warm and dry  Pertinent labs and Studies:  Recent Labs  05/16/17 0423 05/18/17 0409  WBC 8.0 6.1  HGB 10.6* 9.9*  HCT 32.2* 29.8*   BMET  Recent Labs  05/16/17 0423 05/18/17 0409  NA 138 138  K 3.9 3.3*  CL 101 106  CO2 29 25  GLUCOSE 131* 114*  BUN 22* 14  CREATININE 0.83 0.77  CALCIUM 8.2* 8.0*   No results for input(s): LABURIN in the last 72 hours. Results for orders placed or performed during the hospital encounter of 08/26/15  Urine culture     Status: None   Collection Time: 08/26/15  7:25 AM  Result Value Ref Range Status   Specimen Description URINE, CLEAN CATCH   Final   Special Requests NONE  Final   Culture   Final    NO GROWTH 1 DAY Performed at [facility]    Report Status 08/27/2015 FINAL  Final    Imaging: No results found.   Lori [name], [facility] Surgery Pager [phone]

## 2017-05-18 NOTE — Progress Notes (Signed)
PROGRESS NOTE    Lori [name]  KDT:[id number] DOB: 14-Mar-1951 DOA: 05/14/2017 PCP: [name], MD   Chief Complaint  Patient presents with  . Abdominal Pain  . Constipation    Brief Narrative:  HPI on 05/15/2017 by Dr. Jennette Kettle Lori [name] is a 66 y.o. female with medical history significant of cervical cancer and endometrial carcinoma stage 2B with complete response to chemo/radiation, during follow up PET scan in 2017 showed activity in appendix.  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  No additional systemic chemo / radiation done according to her April oncology office note.  Patient was admitted about 2 weeks ago to [facility] with SBO.  This resolved after medical therapy after a couple of days and patient was discharged.  She had recurrence of generalized abd pain, distention, last night.  Last BM was yesterday.  Says symptoms are like SBO again.  Pain is sharp and constant in nature.  Mild nausea but no emesis.  Nothing makes better or worse.  Nothing tried for symptoms PTA. Assessment & Plan   Small bowel obstruction -CT abdomen and pelvis showed small bowel obstruction -Gen. surgery consulted and appreciated -Denies current abdominal pain, nausea or vomiting. Has not had a bowel movement. -not sure what prompted repeat placement of NG tube overnight, however after placement, patient removed it again this morning -endorses flatus this morning, no nausea or vomiting, no abdominal pain -Abd xray today still shows SBO, read as high grade mid to distal SBO, no perf -Tolerated clear liquid diet, will advance to full liquids  Essential hypertension -Continue metoprolol, clonidine patch  Stage IIB cervical cancer, stage IIc appendiceal cancer -s/p chemo/radiation, thought to be in remission -CTM and pelvis showed thickening of endometrial stripe  RUL pulmonary nodule  -oncology  following  Chronic normocytic Anemia  -upon review of Epic, baseline hemoglobin around 10-11 -Currently 9.9 -Continue to monitor CBC  Hypokalemia/hypomagnesemia -Will replace potassium and continue to monitor BMP -Magnesium slightly low, 1.7, will replace and continue to monitor  DVT Prophylaxis  Lovenox  Code Status: FULL  Family Communication: None at bedside  Disposition Plan: Admitted. Will advance patient's diet to full liquid and continue to monitor. Possibly discharge to home on 05/19/2017 on soft diet.  Consultants General surgery  Procedures  none  Antibiotics   Anti-infectives    None      Subjective:   Lori [name] seen and examined today. Patient feeling improved. States she's been having multiple loose watery bowel movements. Denies abdominal pain, nausea or vomiting. Denies any chest pain, short of breath, headache, dizziness. Patient states she wishes to go home by tomorrow she has a graduation party to plan.  Objective:   Vitals:   05/17/17 1425 05/17/17 2051 05/18/17 0021 05/18/17 0452  BP: (!) 144/92 131/70 136/81 (!) 145/76  [name]: 84 81 74 72  Resp: 18 18 18 18   Temp: 98.1 F (36.7 C) 99.5 F (37.5 C) 99.2 F (37.3 C) 98.3 F (36.8 C)  TempSrc: Oral Oral Oral Oral  SpO2: 100% 100% 100% 100%  Weight:      Height:        Intake/Output Summary (Last 24 hours) at 05/18/17 1317 Last data filed at 05/18/17 1030  Gross per 24 hour  Intake          2614.58 ml  Output                0  ml  Net          2614.58 ml   Filed Weights   05/14/17 1947 05/17/17 0523  Weight: 95.3 kg (210 lb) 95.5 kg (210 lb 8.6 oz)   Exam  General: Well developed, well nourished, NAD, appears stated age  41: NCAT, mucous membranes moist.   Cardiovascular: S1 S2 auscultated, no rubs, murmurs or gallops. Regular rate and rhythm.  Respiratory: Clear to auscultation bilaterally with equal chest rise  Abdomen: Soft, obese, nontender, nondistended, + bowel  sounds  Extremities: warm dry without cyanosis clubbing or edema  Neuro: AAOx3, Nonfocal  Psych: appropriate mood and affect, pleasant  Data Reviewed: I have personally reviewed following labs and imaging studies  CBC:  Recent Labs Lab 05/14/17 2106 05/15/17 0700 05/16/17 0423 05/18/17 0409  WBC 17.9* 10.5 8.0 6.1  NEUTROABS  --   --  6.4  --   HGB 13.9 11.0* 10.6* 9.9*  HCT 40.5 33.0* 32.2* 29.8*  MCV 89.4 89.2 90.4 90.9  PLT 442* 325 310 163   Basic Metabolic Panel:  Recent Labs Lab 05/14/17 2106 05/15/17 0700 05/16/17 0423 05/18/17 0409  NA 135 137 138 138  K 4.2 3.7 3.9 3.3*  CL 97* 98* 101 106  CO2 27 32 29 25  GLUCOSE 200* 142* 131* 114*  BUN 19 21* 22* 14  CREATININE 1.06* 0.77 0.83 0.77  CALCIUM 9.1 8.3* 8.2* 8.0*  MG  --   --  1.7  --    GFR: Estimated Creatinine Clearance: 83.5 mL/min (by C-G formula based on SCr of 0.77 mg/dL). Liver Function Tests:  Recent Labs Lab 05/14/17 2106 05/16/17 0423  AST 28 18  ALT 22 16  ALKPHOS 108 85  BILITOT 0.5 0.5  PROT 7.4 5.7*  ALBUMIN 3.7 2.8*    Recent Labs Lab 05/14/17 2106  LIPASE 31   No results for input(s): AMMONIA in the last 168 hours. Coagulation Profile:  Recent Labs Lab 05/16/17 0423  INR 1.09   Cardiac Enzymes: No results for input(s): CKTOTAL, CKMB, CKMBINDEX, TROPONINI in the last 168 hours. BNP (last 3 results) No results for input(s): PROBNP in the last 8760 hours. HbA1C: No results for input(s): HGBA1C in the last 72 hours. CBG: No results for input(s): GLUCAP in the last 168 hours. Lipid Profile: No results for input(s): CHOL, HDL, LDLCALC, TRIG, CHOLHDL, LDLDIRECT in the last 72 hours. Thyroid Function Tests: No results for input(s): TSH, T4TOTAL, FREET4, T3FREE, THYROIDAB in the last 72 hours. Anemia Panel: No results for input(s): VITAMINB12, FOLATE, FERRITIN, TIBC, IRON, RETICCTPCT in the last 72 hours. Urine analysis:    Component Value Date/Time   COLORURINE  AMBER (A) 05/15/2017 0230   APPEARANCEUR CLEAR 05/15/2017 0230   LABSPEC 1.028 05/15/2017 0230   LABSPEC 1.010 08/17/2015 1542   PHURINE 5.0 05/15/2017 0230   GLUCOSEU NEGATIVE 05/15/2017 0230   GLUCOSEU 100 08/17/2015 1542   HGBUR NEGATIVE 05/15/2017 0230   BILIRUBINUR NEGATIVE 05/15/2017 0230   BILIRUBINUR Negative 08/17/2015 1542   KETONESUR NEGATIVE 05/15/2017 0230   PROTEINUR 30 (A) 05/15/2017 0230   UROBILINOGEN 1.0 08/26/2015 0725   UROBILINOGEN 0.2 08/17/2015 1542   NITRITE NEGATIVE 05/15/2017 0230   LEUKOCYTESUR NEGATIVE 05/15/2017 0230   LEUKOCYTESUR Trace 08/17/2015 1542   Sepsis Labs: @LABRCNTIP (procalcitonin:4,lacticidven:4)  )No results found for this or any previous visit (from the past 240 hour(s)).    Radiology Studies: Dg Abd 1 View  Result Date: 05/17/2017 CLINICAL DATA:  Follow-up of small bowel obstruction EXAM:  ABDOMEN - 1 VIEW COMPARISON:  Abdominal radiograph of May 16, 2017 FINDINGS: There remain loops of moderately distended gas-filled small bowel in the mid abdomen. There is some gas within the ascending and transverse colon. No free extraluminal gas collections are observed. There is a tiny amount of gas in the rectum. The esophagogastric tube has been removed. IMPRESSION: Findings compatible with a fairly high-grade mid to distal small bowel obstruction. No evidence of perforation. Electronically Signed   By: David  Martinique M.D.   On: 05/17/2017 08:38   Dg Abd 1 View  Result Date: 05/16/2017 CLINICAL DATA:  NG tube placement. EXAM: ABDOMEN - 1 VIEW COMPARISON:  05/15/2017 FINDINGS: An NG tube is identified with tip overlying the proximal stomach. Dilated small bowel loops are again noted. IMPRESSION: NG tube with tip overlying the proximal stomach. Unchanged dilated small bowel loops. Electronically Signed   By: Margarette Canada M.D.   On: 05/16/2017 19:03     Scheduled Meds: . carbamazepine  100 mg Oral TID  . cloNIDine  0.2 mg Transdermal Weekly  .  enoxaparin (LOVENOX) injection  40 mg Subcutaneous Q24H  . metoprolol tartrate  5 mg Intravenous Q6H  . pneumococcal 23 valent vaccine  0.5 mL Intramuscular Tomorrow-1000   Continuous Infusions: . sodium chloride 125 mL/hr (05/18/17 1034)     LOS: 3 days   Time Spent in minutes   30 minutes  Lori [name] D.O. on 05/18/2017 at 1:17 PM  Between 7am to 7pm - Pager - [phone]  After 7pm go to [email or web address] - password [email or web address]  And look for the night coverage person covering for me after hours  [facility] Office  [phone]

## 2017-05-18 NOTE — Care Management Note (Signed)
Case Management Note  Patient Details  Name: Lori [name] MRN: [id number] Date of Birth: 10/01/1951  Subjective/Objective: 66 y/o f admitted w/SBO. From home.                   Action/Plan:d/c plan home.   Expected Discharge Date:  05/18/17               Expected Discharge Plan:  Home/Self Care  In-House Referral:     Discharge planning Services  CM Consult  Post Acute Care Choice:    Choice offered to:     DME Arranged:    DME Agency:     HH Arranged:    HH Agency:     Status of Service:  In process, will continue to follow  If discussed at Long Length of Stay Meetings, dates discussed:    Additional Comments:  Dessa Phi, RN 05/18/2017, 10:48 AM

## 2017-05-19 LAB — BASIC METABOLIC PANEL
ANION GAP: 6 (ref 5–15)
BUN: 6 mg/dL (ref 6–20)
CHLORIDE: 106 mmol/L (ref 101–111)
CO2: 25 mmol/L (ref 22–32)
Calcium: 7.8 mg/dL — ABNORMAL LOW (ref 8.9–10.3)
Creatinine, Ser: 0.79 mg/dL (ref 0.44–1.00)
GFR calc Af Amer: >60 mL/min (ref >60)
GLUCOSE: 110 mg/dL — AB (ref 65–99)
POTASSIUM: 3.2 mmol/L — AB (ref 3.5–5.1)
Sodium: 137 mmol/L (ref 135–145)

## 2017-05-19 LAB — CBC
HEMATOCRIT: 27.7 % — AB (ref 36.0–46.0)
HEMOGLOBIN: 9.3 g/dL — AB (ref 12.0–15.0)
MCH: 30.6 pg (ref 26.0–34.0)
MCHC: 33.6 g/dL (ref 30.0–36.0)
MCV: 91.1 fL (ref 78.0–100.0)
Platelets: 297 10*3/uL (ref 150–400)
RBC: 3.04 MIL/uL — AB (ref 3.87–5.11)
RDW: 14.1 % (ref 11.5–15.5)
WBC: 4.9 10*3/uL (ref 4.0–10.5)

## 2017-05-19 LAB — MAGNESIUM: Magnesium: 1.4 mg/dL — ABNORMAL LOW (ref 1.7–2.4)

## 2017-05-19 MED ORDER — MAGNESIUM SULFATE 2 GM/50ML IV SOLN
2.0000 g | Freq: Once | INTRAVENOUS | Status: DC
  Filled 2017-05-19: qty 50

## 2017-05-19 MED ORDER — HEPARIN SOD (PORK) LOCK FLUSH 100 UNIT/ML IV SOLN
500.0000 [IU] | INTRAVENOUS | Status: DC | PRN
  Administered 2017-05-19: 500 [IU]

## 2017-05-19 MED ORDER — POTASSIUM CHLORIDE CRYS ER 20 MEQ PO TBCR
40.0000 meq | EXTENDED_RELEASE_TABLET | Freq: Once | ORAL | Status: AC
  Administered 2017-05-19: 40 meq via ORAL
  Filled 2017-05-19: qty 2

## 2017-05-19 MED ORDER — HEPARIN SOD (PORK) LOCK FLUSH 100 UNIT/ML IV SOLN: 500.0000 [IU] | INTRAVENOUS | Status: DC

## 2017-05-19 NOTE — Progress Notes (Signed)
Pt was tolerating diet and no complaints of pain.  Eager to be discharged. PAC was deaccessed and d/c instructions were given.  Understanding was verbalized.

## 2017-05-19 NOTE — Discharge Instructions (Signed)
Small Bowel Obstruction °A small bowel obstruction means that something is blocking the small bowel. The small bowel is also called the small intestine. It is the long tube that connects the stomach to the colon. An obstruction will stop food and fluids from passing through the small bowel. Treatment depends on what is causing the problem and how bad the problem is. °Follow these instructions at home: °· Get a lot of rest. °· Follow your diet as told by your doctor. You may need to: °? Only drink clear liquids until you start to get better. °? Avoid solid foods as told by your doctor. °· Take over-the-counter and prescription medicines only as told by your doctor. °· Keep all follow-up visits as told by your doctor. This is important. °Contact a doctor if: °· You have a fever. °· You have chills. °Get help right away if: °· You have pain or cramps that get worse. °· You throw up (vomit) blood. °· You have a feeling of being sick to your stomach (nausea) that does not go away. °· You cannot stop throwing up. °· You cannot drink fluids. °· You feel confused. °· You feel dry or thirsty (dehydrated). °· Your belly gets more bloated. °· You feel weak or you pass out (faint). °This information is not intended to replace advice given to you by your health care provider. Make sure you discuss any questions you have with your health care provider. °Document Released: 12/28/2004 Document Revised: 07/17/2016 Document Reviewed: 01/14/2015 °Elsevier Interactive Patient Education © 2018 [employer] Inc[employer] ° °

## 2017-05-19 NOTE — Discharge Summary (Signed)
Physician Discharge Summary  Lori [name] ZMO:[id number] DOB: Dec 21, 1950 DOA: 05/14/2017  PCP: [name], MD  Admit date: 05/14/2017 Discharge date: 05/19/2017  Time spent: 45 minutes  Recommendations for Outpatient Follow-up:  Patient will be discharged to home.  Patient will need to follow up with primary care provider within one week of discharge, repeat BMP and CBC, magnesium.  Follow up with surgery as needed. Patient should continue medications as prescribed.  Patient should follow a soft diet.   Discharge Diagnoses:  Small bowel obstruction Essential hypertension Stage IIB cervical cancer, stage IIc appendiceal cancer RUL pulmonary nodule  Chronic normocytic Anemia  Hypokalemia/hypomagnesemia  Discharge Condition: Stable  Diet recommendation: Soft  Filed Weights   05/14/17 1947 05/17/17 0523  Weight: 95.3 kg (210 lb) 95.5 kg (210 lb 8.6 oz)    History of present illness:  on 05/15/2017 by Dr. Demaris Callander a 66 y.o.femalewith medical history significant of cervical cancer and endometrial carcinoma stage 2B with complete response to chemo/radiation, during follow up PET scan in 2017 showed activity in appendix. 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 No additional systemic chemo / radiation done according to her April oncology office note.  Patient was admitted about 2 weeks ago to [facility] with SBO. This resolved after medical therapy after a couple of days and patient was discharged. She had recurrence of generalized abd pain, distention, last night. Last BM was yesterday. Says symptoms are like SBO again. Pain is sharp and constant in nature. Mild nausea but no emesis. Nothing makes better or worse. Nothing tried for symptoms PTA.  Hospital Course:  Small bowel obstruction -CT abdomen and pelvis showed small bowel obstruction -Gen. surgery consulted and  appreciated -Denies current abdominal pain, nausea or vomiting. Has not had a bowel movement. -not sure what prompted repeat placement of NG tube overnight, however after placement, patient removed it again this morning -endorses flatus this morning, no nausea or vomiting, no abdominal pain -Abd xray today still shows SBO, read as high grade mid to distal SBO, no perf -Tolerated full liquids, advanced to soft! Discussed the importance of advancing her diet SLOWLY! Patient insistent on being discharged today to go to a cookout.   Essential hypertension -Continue home medicaitons  Stage IIB cervical cancer, stage IIc appendiceal cancer -s/p chemo/radiation, thought to be in remission -CTM and pelvis showed thickening of endometrial stripe  RUL pulmonary nodule  -oncology following  Chronic normocytic Anemia  -upon review of Epic, baseline hemoglobin around 10-11 -Currently 9.3 -Continue to monitor CBC  Hypokalemia/hypomagnesemia -Will replace potassium -Magnesium 1.4, replaced with IV and oral supplementation. -Repeat BMP and magnesium in one week  Procedures: None  Consultations: General surgery  Discharge Exam: Vitals:   05/18/17 2335 05/19/17 0516  BP: (!) 153/93 (!) 143/77  Pulse: 92 78  Resp: 16 16  Temp:  98.9 F (37.2 C)   Patient denies further abdominal pain, nausea, vomiting. Denies chest pain, shortness of breath, dizziness, headache.   General: Well developed, well nourished, NAD, appears stated age  HEENT: NCAT, mucous membranes moist. Poor dentition   Cardiovascular: S1 S2 auscultated, no rubs, murmurs or gallops. Regular rate and rhythm.  Respiratory: Clear to auscultation bilaterally with equal chest rise  Abdomen: Soft, obese, nontender, nondistended, + bowel sounds  Extremities: warm dry without cyanosis clubbing or edema  Neuro: AAOx3, nonfocal  Psych: Normal affect and demeanor with intact judgement and insight, pleasant  Discharge  Instructions Discharge Instructions    Discharge instructions    Complete by:  As directed    Patient will be discharged to home.  Patient will need to follow up with primary care provider within one week of discharge, repeat BMP and CBC, magnesium.  Follow up with surgery as needed. Patient should continue medications as prescribed.  Patient should follow a soft diet.     Current Discharge Medication List    CONTINUE these medications which have NOT CHANGED   Details  amLODipine (NORVASC) 10 MG tablet Take 10 mg by mouth every morning.  Refills: 5    aspirin EC 81 MG tablet Take 81 mg by mouth daily.    benazepril (LOTENSIN) 40 MG tablet Take 40 mg by mouth every morning.  Refills: 5    carbamazepine (TEGRETOL) 100 MG chewable tablet Chew 100 mg by mouth 3 (three) times daily.  Refills: 5    cholecalciferol (VITAMIN D) 1000 units tablet Take 1,000 Units by mouth daily. 2 tablets daily    cloNIDine (CATAPRES) 0.2 MG tablet Take 0.2 mg by mouth 3 (three) times daily.  Refills: 6    gabapentin (NEURONTIN) 300 MG capsule Take 300 mg by mouth 2 (two) times daily.  Refills: 5    hydrochlorothiazide (HYDRODIURIL) 25 MG tablet Take 25 mg by mouth every morning.  Refills: 11    magnesium oxide (MAG-OX) 400 (241.3 Mg) MG tablet Take 400 mg by mouth daily.     metoprolol (LOPRESSOR) 50 MG tablet Take 25 mg by mouth 2 (two) times daily.  Refills: 5    potassium chloride SA (K-DUR,KLOR-CON) 20 MEQ tablet Take 20 mEq by mouth 2 (two) times daily.  Refills: 6    pravastatin (PRAVACHOL) 20 MG tablet Take 20 mg by mouth every evening.  Refills: 2    loperamide (IMODIUM A-D) 2 MG tablet Take 2 mg by mouth 4 (four) times daily as needed for diarrhea or loose stools. Reported on 03/28/2016       Allergies  Allergen Reactions  . Contrast Media [Iodinated Diagnostic Agents] Itching and Swelling  . Dilantin [Phenytoin Sodium Extended] Itching, Swelling and Other (See Comments)    Whole  body  . Latex Hives and Itching  . Adhesive [Tape] Rash   Follow-up Information    [name], MD. Schedule an appointment as soon as possible for a visit in 1 week(s).   Specialty:  Internal Medicine Why:  Hospital follow up Contact information: [place] [address] [phone]        Johnathan Hausen, MD. Schedule an appointment as soon as possible for a visit.   Specialty:  General Surgery Why:  As needed Contact information: [place] [address] [phone]            The results of significant diagnostics from this hospitalization (including imaging, microbiology, ancillary and laboratory) are listed below for reference.    Significant Diagnostic Studies: Ct Abdomen Pelvis Wo Contrast  Result Date: 05/15/2017 CLINICAL DATA:  Abdominal pain and constipation EXAM: CT ABDOMEN AND PELVIS WITHOUT CONTRAST TECHNIQUE: Multidetector CT imaging of the abdomen and pelvis was performed following the standard protocol without IV contrast. COMPARISON:  05/05/2017, 05/03/2017 FINDINGS: Lower chest: Lung bases demonstrate no acute consolidation or effusion. There is cardiomegaly. Small pericardial effusion. Coronary artery calcification. Hiatal hernia with distal esophageal thickening. Hepatobiliary: No calcified gallstones. No biliary dilatation or focal hepatic abnormality. Pancreas: Unremarkable. No pancreatic ductal dilatation or surrounding inflammatory changes.  Spleen: Normal in size without focal abnormality. Adrenals/Urinary Tract: Adrenal glands are within normal limits. Atrophic and scarred left kidney. No hydronephrosis. The bladder is unremarkable. Stomach/Bowel: Marked enlargement of the stomach. Multiple loops of dilated, fluid-filled small bowel with slight increased distension since the previous examination. Findings consistent with mechanical bowel obstruction. No colon wall thickening. Prior appendectomy. Vascular/Lymphatic: Aortic  atherosclerosis. No enlarged abdominal or pelvic lymph nodes. Reproductive: Suspect that there is thickening of the endometrial stripe, measuring 19 mm on sagittal views. No adnexal masses. Other: New or increased free fluid in the pelvis and around the liver. No free air. Small pockets of soft tissue gas in the left gluteal region. Musculoskeletal: No acute or suspicious bone lesion. IMPRESSION: 1. Findings again consistent with mechanical small bowel obstruction, the stomach is markedly enlarged. Overall increased dilatation of the small bowel loops since the previous study, consistent with worsening obstruction. No free air, however increasing free fluid in the abdomen and pelvis. 2. Cardiomegaly with small pericardial effusion. 3. Suspect thickening of the endometrial stripe, this may be correlated with pelvic ultrasound 4. Electronically Signed   By: Donavan Foil M.D.   On: 05/15/2017 02:44   Dg Chest 1 View  Result Date: 05/15/2017 CLINICAL DATA:  Assess position of right-sided chest port. Initial encounter. EXAM: CHEST 1 VIEW COMPARISON:  PET/CT performed 02/02/2017 FINDINGS: The patient's right-sided chest port is noted ending about the distal SVC. An enteric tube is noted extending below the diaphragm. Mild peribronchial thickening is noted. No pleural effusion or pneumothorax is seen. The cardiomediastinal silhouette is borderline normal in size. No acute osseous abnormalities are identified. IMPRESSION: 1. Right-sided chest port noted ending about the distal SVC. 2. Mild peribronchial thickening noted.  Lungs otherwise clear. Electronically Signed   By: Garald Balding M.D.   On: 05/15/2017 03:41   Dg Abd 1 View  Result Date: 05/17/2017 CLINICAL DATA:  Follow-up of small bowel obstruction EXAM: ABDOMEN - 1 VIEW COMPARISON:  Abdominal radiograph of May 16, 2017 FINDINGS: There remain loops of moderately distended gas-filled small bowel in the mid abdomen. There is some gas within the ascending and  transverse colon. No free extraluminal gas collections are observed. There is a tiny amount of gas in the rectum. The esophagogastric tube has been removed. IMPRESSION: Findings compatible with a fairly high-grade mid to distal small bowel obstruction. No evidence of perforation. Electronically Signed   By: David  Martinique M.D.   On: 05/17/2017 08:38   Dg Abd 1 View  Result Date: 05/16/2017 CLINICAL DATA:  NG tube placement. EXAM: ABDOMEN - 1 VIEW COMPARISON:  05/15/2017 FINDINGS: An NG tube is identified with tip overlying the proximal stomach. Dilated small bowel loops are again noted. IMPRESSION: NG tube with tip overlying the proximal stomach. Unchanged dilated small bowel loops. Electronically Signed   By: Margarette Canada M.D.   On: 05/16/2017 19:03   Dg Abdomen 1 View  Result Date: 05/15/2017 CLINICAL DATA:  NG tube placement EXAM: ABDOMEN - 1 VIEW COMPARISON:  05/15/2017 FINDINGS: Esophageal tube tip overlies the mid stomach. Multiple loops of dilated small bowel consistent with a bowel obstruction. IMPRESSION: Esophageal tube tip overlies the mid stomach. Electronically Signed   By: Donavan Foil M.D.   On: 05/15/2017 03:36    Microbiology: No results found for this or any previous visit (from the past 240 hour(s)).   Labs: Basic Metabolic Panel:  Recent Labs Lab 05/14/17 2106 05/15/17 0700 05/16/17 0423 05/18/17 0409 05/19/17 0357  NA 135  137 138 138 137  K 4.2 3.7 3.9 3.3* 3.2*  CL 97* 98* 101 106 106  CO2 27 32 29 25 25   GLUCOSE 200* 142* 131* 114* 110*  BUN 19 21* 22* 14 6  CREATININE 1.06* 0.77 0.83 0.77 0.79  CALCIUM 9.1 8.3* 8.2* 8.0* 7.8*  MG  --   --  1.7  --  1.4*   Liver Function Tests:  Recent Labs Lab 05/14/17 2106 05/16/17 0423  AST 28 18  ALT 22 16  ALKPHOS 108 85  BILITOT 0.5 0.5  PROT 7.4 5.7*  ALBUMIN 3.7 2.8*    Recent Labs Lab 05/14/17 2106  LIPASE 31   No results for input(s): AMMONIA in the last 168 hours. CBC:  Recent Labs Lab  05/14/17 2106 05/15/17 0700 05/16/17 0423 05/18/17 0409 05/19/17 0357  WBC 17.9* 10.5 8.0 6.1 4.9  NEUTROABS  --   --  6.4  --   --   HGB 13.9 11.0* 10.6* 9.9* 9.3*  HCT 40.5 33.0* 32.2* 29.8* 27.7*  MCV 89.4 89.2 90.4 90.9 91.1  PLT 442* 325 310 325 297   Cardiac Enzymes: No results for input(s): CKTOTAL, CKMB, CKMBINDEX, TROPONINI in the last 168 hours. BNP: BNP (last 3 results) No results for input(s): BNP in the last 8760 hours.  ProBNP (last 3 results) No results for input(s): PROBNP in the last 8760 hours.  CBG: No results for input(s): GLUCAP in the last 168 hours.     SignedCristal Ford  [facility] 05/19/2017, 10:59 AM

## 2017-05-28 DIAGNOSIS — Z8049 Family history of malignant neoplasm of other genital organs: Secondary | ICD-10-CM

## 2017-05-28 DIAGNOSIS — R911 Solitary pulmonary nodule: Secondary | ICD-10-CM | POA: Diagnosis present

## 2017-05-28 DIAGNOSIS — D638 Anemia in other chronic diseases classified elsewhere: Secondary | ICD-10-CM | POA: Diagnosis present

## 2017-05-28 DIAGNOSIS — Z91048 Other nonmedicinal substance allergy status: Secondary | ICD-10-CM

## 2017-05-28 DIAGNOSIS — Z923 Personal history of irradiation: Secondary | ICD-10-CM

## 2017-05-28 DIAGNOSIS — Z888 Allergy status to other drugs, medicaments and biological substances status: Secondary | ICD-10-CM

## 2017-05-28 DIAGNOSIS — C784 Secondary malignant neoplasm of small intestine: Secondary | ICD-10-CM | POA: Diagnosis present

## 2017-05-28 DIAGNOSIS — L299 Pruritus, unspecified: Secondary | ICD-10-CM | POA: Diagnosis not present

## 2017-05-28 DIAGNOSIS — K651 Peritoneal abscess: Secondary | ICD-10-CM | POA: Diagnosis present

## 2017-05-28 DIAGNOSIS — N179 Acute kidney failure, unspecified: Secondary | ICD-10-CM | POA: Diagnosis present

## 2017-05-28 DIAGNOSIS — C181 Malignant neoplasm of appendix: Secondary | ICD-10-CM | POA: Diagnosis present

## 2017-05-28 DIAGNOSIS — E861 Hypovolemia: Secondary | ICD-10-CM | POA: Diagnosis present

## 2017-05-28 DIAGNOSIS — Z9221 Personal history of antineoplastic chemotherapy: Secondary | ICD-10-CM

## 2017-05-28 DIAGNOSIS — Z7982 Long term (current) use of aspirin: Secondary | ICD-10-CM

## 2017-05-28 DIAGNOSIS — R109 Unspecified abdominal pain: Secondary | ICD-10-CM | POA: Diagnosis not present

## 2017-05-28 DIAGNOSIS — K219 Gastro-esophageal reflux disease without esophagitis: Secondary | ICD-10-CM | POA: Diagnosis present

## 2017-05-28 DIAGNOSIS — Z8541 Personal history of malignant neoplasm of cervix uteri: Secondary | ICD-10-CM

## 2017-05-28 DIAGNOSIS — Z91041 Radiographic dye allergy status: Secondary | ICD-10-CM

## 2017-05-28 DIAGNOSIS — A419 Sepsis, unspecified organism: Secondary | ICD-10-CM | POA: Diagnosis not present

## 2017-05-28 DIAGNOSIS — Z9049 Acquired absence of other specified parts of digestive tract: Secondary | ICD-10-CM

## 2017-05-28 DIAGNOSIS — R6521 Severe sepsis with septic shock: Secondary | ICD-10-CM | POA: Diagnosis present

## 2017-05-28 DIAGNOSIS — E860 Dehydration: Secondary | ICD-10-CM | POA: Diagnosis present

## 2017-05-28 DIAGNOSIS — K5669 Other partial intestinal obstruction: Secondary | ICD-10-CM | POA: Diagnosis present

## 2017-05-28 DIAGNOSIS — E785 Hyperlipidemia, unspecified: Secondary | ICD-10-CM | POA: Diagnosis present

## 2017-05-28 DIAGNOSIS — Z808 Family history of malignant neoplasm of other organs or systems: Secondary | ICD-10-CM

## 2017-05-28 DIAGNOSIS — Z91040 Latex allergy status: Secondary | ICD-10-CM

## 2017-05-28 DIAGNOSIS — Z6834 Body mass index (BMI) 34.0-34.9, adult: Secondary | ICD-10-CM

## 2017-05-28 DIAGNOSIS — R680 Hypothermia, not associated with low environmental temperature: Secondary | ICD-10-CM | POA: Diagnosis not present

## 2017-05-28 DIAGNOSIS — K631 Perforation of intestine (nontraumatic): Secondary | ICD-10-CM | POA: Diagnosis present

## 2017-05-28 DIAGNOSIS — E871 Hypo-osmolality and hyponatremia: Secondary | ICD-10-CM | POA: Diagnosis present

## 2017-05-28 DIAGNOSIS — Z79899 Other long term (current) drug therapy: Secondary | ICD-10-CM

## 2017-05-28 DIAGNOSIS — C786 Secondary malignant neoplasm of retroperitoneum and peritoneum: Secondary | ICD-10-CM | POA: Diagnosis present

## 2017-05-28 DIAGNOSIS — F329 Major depressive disorder, single episode, unspecified: Secondary | ICD-10-CM | POA: Diagnosis present

## 2017-05-28 DIAGNOSIS — I10 Essential (primary) hypertension: Secondary | ICD-10-CM | POA: Diagnosis present

## 2017-05-28 DIAGNOSIS — G40909 Epilepsy, unspecified, not intractable, without status epilepticus: Secondary | ICD-10-CM | POA: Diagnosis present

## 2017-05-28 DIAGNOSIS — C772 Secondary and unspecified malignant neoplasm of intra-abdominal lymph nodes: Secondary | ICD-10-CM | POA: Diagnosis present

## 2017-05-28 DIAGNOSIS — M25511 Pain in right shoulder: Secondary | ICD-10-CM | POA: Diagnosis not present

## 2017-05-28 DIAGNOSIS — R011 Cardiac murmur, unspecified: Secondary | ICD-10-CM | POA: Diagnosis present

## 2017-05-28 DIAGNOSIS — E1165 Type 2 diabetes mellitus with hyperglycemia: Secondary | ICD-10-CM | POA: Diagnosis present

## 2017-05-28 DIAGNOSIS — I9581 Postprocedural hypotension: Secondary | ICD-10-CM | POA: Diagnosis not present

## 2017-05-28 DIAGNOSIS — Z87891 Personal history of nicotine dependence: Secondary | ICD-10-CM

## 2017-05-28 DIAGNOSIS — E876 Hypokalemia: Secondary | ICD-10-CM | POA: Diagnosis present

## 2017-05-28 DIAGNOSIS — I248 Other forms of acute ischemic heart disease: Secondary | ICD-10-CM | POA: Diagnosis present

## 2017-05-29 ENCOUNTER — Encounter (HOSPITAL_COMMUNITY): Payer: Self-pay

## 2017-05-29 ENCOUNTER — Inpatient Hospital Stay (HOSPITAL_COMMUNITY): Payer: Medicare Other

## 2017-05-29 ENCOUNTER — Inpatient Hospital Stay (HOSPITAL_COMMUNITY)
Admission: EM | Admit: 2017-05-29 | Discharge: 2017-06-08 | DRG: 853 | Disposition: A | Discharge status: Skilled Nursing Facility | Payer: Medicare Other | Attending: Internal Medicine | Admitting: Internal Medicine

## 2017-05-29 ENCOUNTER — Emergency Department (HOSPITAL_COMMUNITY): Payer: Medicare Other

## 2017-05-29 DIAGNOSIS — E860 Dehydration: Secondary | ICD-10-CM | POA: Diagnosis present

## 2017-05-29 DIAGNOSIS — R6521 Severe sepsis with septic shock: Secondary | ICD-10-CM | POA: Diagnosis present

## 2017-05-29 DIAGNOSIS — K631 Perforation of intestine (nontraumatic): Secondary | ICD-10-CM | POA: Diagnosis present

## 2017-05-29 DIAGNOSIS — C786 Secondary malignant neoplasm of retroperitoneum and peritoneum: Secondary | ICD-10-CM

## 2017-05-29 DIAGNOSIS — K5669 Other partial intestinal obstruction: Secondary | ICD-10-CM | POA: Diagnosis present

## 2017-05-29 DIAGNOSIS — E871 Hypo-osmolality and hyponatremia: Secondary | ICD-10-CM | POA: Diagnosis present

## 2017-05-29 DIAGNOSIS — R569 Unspecified convulsions: Secondary | ICD-10-CM | POA: Diagnosis not present

## 2017-05-29 DIAGNOSIS — A419 Sepsis, unspecified organism: Secondary | ICD-10-CM

## 2017-05-29 DIAGNOSIS — E1165 Type 2 diabetes mellitus with hyperglycemia: Secondary | ICD-10-CM | POA: Diagnosis present

## 2017-05-29 DIAGNOSIS — C7801 Secondary malignant neoplasm of right lung: Secondary | ICD-10-CM | POA: Diagnosis not present

## 2017-05-29 DIAGNOSIS — K56609 Unspecified intestinal obstruction, unspecified as to partial versus complete obstruction: Secondary | ICD-10-CM | POA: Diagnosis present

## 2017-05-29 DIAGNOSIS — C801 Malignant (primary) neoplasm, unspecified: Secondary | ICD-10-CM | POA: Diagnosis not present

## 2017-05-29 DIAGNOSIS — Z0189 Encounter for other specified special examinations: Secondary | ICD-10-CM

## 2017-05-29 DIAGNOSIS — K651 Peritoneal abscess: Secondary | ICD-10-CM | POA: Diagnosis present

## 2017-05-29 DIAGNOSIS — C539 Malignant neoplasm of cervix uteri, unspecified: Secondary | ICD-10-CM | POA: Diagnosis not present

## 2017-05-29 DIAGNOSIS — G40909 Epilepsy, unspecified, not intractable, without status epilepticus: Secondary | ICD-10-CM | POA: Diagnosis present

## 2017-05-29 DIAGNOSIS — D638 Anemia in other chronic diseases classified elsewhere: Secondary | ICD-10-CM | POA: Diagnosis present

## 2017-05-29 DIAGNOSIS — C181 Malignant neoplasm of appendix: Secondary | ICD-10-CM | POA: Diagnosis present

## 2017-05-29 DIAGNOSIS — C772 Secondary and unspecified malignant neoplasm of intra-abdominal lymph nodes: Secondary | ICD-10-CM | POA: Diagnosis present

## 2017-05-29 DIAGNOSIS — Z978 Presence of other specified devices: Secondary | ICD-10-CM

## 2017-05-29 DIAGNOSIS — I10 Essential (primary) hypertension: Secondary | ICD-10-CM

## 2017-05-29 DIAGNOSIS — J9600 Acute respiratory failure, unspecified whether with hypoxia or hypercapnia: Secondary | ICD-10-CM

## 2017-05-29 DIAGNOSIS — E785 Hyperlipidemia, unspecified: Secondary | ICD-10-CM | POA: Diagnosis present

## 2017-05-29 DIAGNOSIS — C784 Secondary malignant neoplasm of small intestine: Secondary | ICD-10-CM | POA: Diagnosis present

## 2017-05-29 DIAGNOSIS — E861 Hypovolemia: Secondary | ICD-10-CM | POA: Diagnosis present

## 2017-05-29 DIAGNOSIS — R109 Unspecified abdominal pain: Secondary | ICD-10-CM | POA: Diagnosis present

## 2017-05-29 DIAGNOSIS — N179 Acute kidney failure, unspecified: Secondary | ICD-10-CM | POA: Diagnosis present

## 2017-05-29 DIAGNOSIS — Z8541 Personal history of malignant neoplasm of cervix uteri: Secondary | ICD-10-CM | POA: Diagnosis not present

## 2017-05-29 DIAGNOSIS — R680 Hypothermia, not associated with low environmental temperature: Secondary | ICD-10-CM | POA: Diagnosis not present

## 2017-05-29 DIAGNOSIS — I9581 Postprocedural hypotension: Secondary | ICD-10-CM | POA: Diagnosis not present

## 2017-05-29 DIAGNOSIS — K219 Gastro-esophageal reflux disease without esophagitis: Secondary | ICD-10-CM | POA: Diagnosis present

## 2017-05-29 DIAGNOSIS — I248 Other forms of acute ischemic heart disease: Secondary | ICD-10-CM | POA: Diagnosis present

## 2017-05-29 DIAGNOSIS — T8112XS Postprocedural septic shock, sequela: Secondary | ICD-10-CM | POA: Diagnosis not present

## 2017-05-29 DIAGNOSIS — E876 Hypokalemia: Secondary | ICD-10-CM | POA: Diagnosis present

## 2017-05-29 LAB — COMPREHENSIVE METABOLIC PANEL
ALT: 23 U/L (ref 14–54)
ANION GAP: 10 (ref 5–15)
AST: 21 U/L (ref 15–41)
Albumin: 2.8 g/dL — ABNORMAL LOW (ref 3.5–5.0)
Alkaline Phosphatase: 68 U/L (ref 38–126)
BILIRUBIN TOTAL: 0.4 mg/dL (ref 0.3–1.2)
BUN: 26 mg/dL — AB (ref 6–20)
CO2: 25 mmol/L (ref 22–32)
Calcium: 8.3 mg/dL — ABNORMAL LOW (ref 8.9–10.3)
Chloride: 94 mmol/L — ABNORMAL LOW (ref 101–111)
Creatinine, Ser: 0.86 mg/dL (ref 0.44–1.00)
GFR calc Af Amer: >60 mL/min (ref >60)
Glucose, Bld: 182 mg/dL — ABNORMAL HIGH (ref 65–99)
POTASSIUM: 3.6 mmol/L (ref 3.5–5.1)
Sodium: 129 mmol/L — ABNORMAL LOW (ref 135–145)
TOTAL PROTEIN: 5.8 g/dL — AB (ref 6.5–8.1)

## 2017-05-29 LAB — BASIC METABOLIC PANEL
ANION GAP: 8 (ref 5–15)
BUN: 26 mg/dL — AB (ref 6–20)
CALCIUM: 8 mg/dL — AB (ref 8.9–10.3)
CO2: 28 mmol/L (ref 22–32)
CREATININE: 0.85 mg/dL (ref 0.44–1.00)
Chloride: 98 mmol/L — ABNORMAL LOW (ref 101–111)
GFR calc Af Amer: >60 mL/min (ref >60)
GLUCOSE: 140 mg/dL — AB (ref 65–99)
Potassium: 3.6 mmol/L (ref 3.5–5.1)
Sodium: 134 mmol/L — ABNORMAL LOW (ref 135–145)

## 2017-05-29 LAB — CBC
HCT: 32 % — ABNORMAL LOW (ref 36.0–46.0)
HEMOGLOBIN: 10.7 g/dL — AB (ref 12.0–15.0)
MCH: 29.6 pg (ref 26.0–34.0)
MCHC: 33.4 g/dL (ref 30.0–36.0)
MCV: 88.4 fL (ref 78.0–100.0)
PLATELETS: 322 10*3/uL (ref 150–400)
RBC: 3.62 MIL/uL — ABNORMAL LOW (ref 3.87–5.11)
RDW: 14.5 % (ref 11.5–15.5)
WBC: 9.1 10*3/uL (ref 4.0–10.5)

## 2017-05-29 LAB — CBC WITH DIFFERENTIAL/PLATELET
Basophils Absolute: 0 10*3/uL (ref 0.0–0.1)
Basophils Relative: 0 %
Eosinophils Absolute: 0.1 10*3/uL (ref 0.0–0.7)
Eosinophils Relative: 2 %
HEMATOCRIT: 33.7 % — AB (ref 36.0–46.0)
Hemoglobin: 11.3 g/dL — ABNORMAL LOW (ref 12.0–15.0)
LYMPHS ABS: 1.3 10*3/uL (ref 0.7–4.0)
LYMPHS PCT: 15 %
MCH: 28.9 pg (ref 26.0–34.0)
MCHC: 33.5 g/dL (ref 30.0–36.0)
MCV: 86.2 fL (ref 78.0–100.0)
MONO ABS: 0.9 10*3/uL (ref 0.1–1.0)
MONOS PCT: 10 %
NEUTROS ABS: 6.4 10*3/uL (ref 1.7–7.7)
Neutrophils Relative %: 73 %
Platelets: 385 10*3/uL (ref 150–400)
RBC: 3.91 MIL/uL (ref 3.87–5.11)
RDW: 14.3 % (ref 11.5–15.5)
WBC: 8.8 10*3/uL (ref 4.0–10.5)

## 2017-05-29 LAB — GLUCOSE, CAPILLARY
GLUCOSE-CAPILLARY: 121 mg/dL — AB (ref 65–99)
Glucose-Capillary: 136 mg/dL — ABNORMAL HIGH (ref 65–99)

## 2017-05-29 LAB — I-STAT CG4 LACTIC ACID, ED: LACTIC ACID, VENOUS: 1.19 mmol/L (ref 0.5–1.9)

## 2017-05-29 MED ORDER — ACETAMINOPHEN 325 MG PO TABS: 650.0000 mg | ORAL_TABLET | Freq: Four times a day (QID) | ORAL | Status: DC | PRN

## 2017-05-29 MED ORDER — ONDANSETRON HCL 4 MG/2ML IJ SOLN
4.0000 mg | Freq: Four times a day (QID) | INTRAMUSCULAR | Status: DC | PRN
Start: 2017-05-29 — End: 2017-06-08
  Administered 2017-05-30: 4 mg via INTRAVENOUS

## 2017-05-29 MED ORDER — CEFAZOLIN SODIUM-DEXTROSE 2-4 GM/100ML-% IV SOLN
2.0000 g | INTRAVENOUS | Status: AC
  Administered 2017-05-30: 2 g via INTRAVENOUS
  Filled 2017-05-29: qty 100

## 2017-05-29 MED ORDER — HYDRALAZINE HCL 20 MG/ML IJ SOLN
10.0000 mg | INTRAMUSCULAR | Status: DC | PRN
  Filled 2017-05-29: qty 0.5

## 2017-05-29 MED ORDER — METOPROLOL TARTRATE 5 MG/5ML IV SOLN
2.5000 mg | Freq: Three times a day (TID) | INTRAVENOUS | Status: DC
  Administered 2017-05-29 – 2017-05-30 (×4): 2.5 mg via INTRAVENOUS
  Filled 2017-05-29 (×4): qty 5

## 2017-05-29 MED ORDER — POTASSIUM CHLORIDE IN NACL 20-0.9 MEQ/L-% IV SOLN
INTRAVENOUS | Status: DC
  Administered 2017-05-29: 1000 mL via INTRAVENOUS
  Filled 2017-05-29 (×3): qty 1000

## 2017-05-29 MED ORDER — PANTOPRAZOLE SODIUM 40 MG IV SOLR
40.0000 mg | INTRAVENOUS | Status: DC
  Administered 2017-05-29 – 2017-06-07 (×10): 40 mg via INTRAVENOUS
  Filled 2017-05-29 (×9): qty 40

## 2017-05-29 MED ORDER — SODIUM CHLORIDE 0.9 % IV SOLN
INTRAVENOUS | Status: DC
  Administered 2017-05-29: 05:00:00 via INTRAVENOUS

## 2017-05-29 MED ORDER — MORPHINE SULFATE (PF) 4 MG/ML IV SOLN
4.0000 mg | Freq: Once | INTRAVENOUS | Status: AC
  Administered 2017-05-29: 4 mg via INTRAVENOUS
  Filled 2017-05-29: qty 1

## 2017-05-29 MED ORDER — CLONIDINE HCL 0.2 MG/24HR TD PTWK
0.2000 mg | MEDICATED_PATCH | TRANSDERMAL | Status: DC
  Administered 2017-05-29: 0.2 mg via TRANSDERMAL
  Filled 2017-05-29: qty 1

## 2017-05-29 MED ORDER — ONDANSETRON HCL 4 MG/2ML IJ SOLN
4.0000 mg | Freq: Once | INTRAMUSCULAR | Status: AC
  Administered 2017-05-29: 4 mg via INTRAVENOUS
  Filled 2017-05-29: qty 2

## 2017-05-29 MED ORDER — ONDANSETRON HCL 4 MG PO TABS: 4.0000 mg | ORAL_TABLET | Freq: Four times a day (QID) | ORAL | Status: DC | PRN

## 2017-05-29 MED ORDER — ACETAMINOPHEN 650 MG RE SUPP: 650.0000 mg | Freq: Four times a day (QID) | RECTAL | Status: DC | PRN

## 2017-05-29 NOTE — ED Triage Notes (Signed)
Pt was discharged from inpatient on Saturday with a SBO Pt states that she had an NG tube but no suregery, her follow up is Friday with Dr Hassell Done Pt states she's having severe abd pain and her abd is distended She hasn't had a BM and is having painful belching

## 2017-05-29 NOTE — Care Management Note (Signed)
Case Management Note  Patient Details  Name: Lori [name] MRN: [id number] Date of Birth: 03-Nov-1951  Subjective/Objective: 66 y/o f admitted w/SBO. From home. Readmit-SBO.                   Action/Plan:d/c plan home.   Expected Discharge Date:                  Expected Discharge Plan:  Home/Self Care  In-House Referral:     Discharge planning Services  CM Consult  Post Acute Care Choice:    Choice offered to:     DME Arranged:    DME Agency:     HH Arranged:    HH Agency:     Status of Service:  In process, will continue to follow  If discussed at Long Length of Stay Meetings, dates discussed:    Additional Comments:  Dessa Phi, RN 05/29/2017, 11:05 AM

## 2017-05-29 NOTE — Progress Notes (Signed)
The patient was seen and examined at bedside. She was admitted today morning. Please see H&P for detail.  66 year old female with history of cervical cancer status post chemoradiation therapy, status post appendectomy was discharged about 10 days ago after admission for small bowel obstruction presented with increasing abdominal distention associated with nausea vomiting and not feeling well. Patient reported that she did not really feel better after recent hospital discharge. She was trying to advance diet but did not tolerate. In the ER found to have small bowel obstruction. General surgery was consulted, NG tube was placed Nothing by mouth and admitted for further evaluation. Hyponatremia improved Continue nothing by mouth Continue IV fluid Add Protonix IV Check blood sugar level while nothing by mouth Continue IV metoprolol Monitor labs Follow-up general surgery for definitive plan for SBO. DVT prophylaxis with SCD. Holding anticoagulation because she may need surgical intervention  Lying on bed comfortable with NG tube Lungs clear bilateral Regular rate rhythm, S1 is normal. Bowel sound hyperactive, soft, mild tenderness diffuse No lower extremity edema

## 2017-05-29 NOTE — Progress Notes (Signed)
[facility] Progress Note     Subjective: CC: Distention and abdominal pain  Patient recently discharged 10 days ago after being treated for SBO. Patient states she really never felt back to baseline. Was taking liquids at home and started eating some more solid foods Saturday, started feeling bad Sunday. Patient complains of pressure and abdominal discomfort with nausea but no vomiting. Last BM Sunday, diarrhea. Still having flatus.   Objective: Vital signs in last 24 hours: Temp:  [97.6 F (36.4 C)-98.3 F (36.8 C)] 97.6 F (36.4 C) (06/26 0531) Pulse Rate:  [86-97] 86 (06/26 0715) Resp:  [18-20] 20 (06/26 0531) BP: (76-112)/(50-79) 112/63 (06/26 0715) SpO2:  [95 %-98 %] 97 % (06/26 0531) Weight:  [94.1 kg (207 lb 7.3 oz)-95.3 kg (210 lb)] 94.1 kg (207 lb 7.3 oz) (06/26 0531) Last BM Date: 05/27/17  Intake/Output from previous day: No intake/output data recorded. Intake/Output this shift: No intake/output data recorded.  PE: Gen:  Alert, NAD, pleasant HEENT: NCAT, PEERL, no scleral icterus. External ear normal BL. Mouth moist/pink. Nose normal, NG present. Card:  Regular rate and rhythm, pedal pulses 2+ BL, radial pulses 2+ BL. No lower extremity edema Pulm:  Normal effort, clear to auscultation bilaterally Abd: Soft, non-tender, moderate distention, tympanic on percussion, bowel sounds present in all 4 quadrants Skin: warm and dry, no rashes  Psych: A&Ox3   Lab Results:   Recent Labs  05/29/17 0242 05/29/17 0631  WBC 8.8 9.1  HGB 11.3* 10.7*  HCT 33.7* 32.0*  PLT 385 322   BMET  Recent Labs  05/29/17 0242 05/29/17 0631  NA 129* 134*  K 3.6 3.6  CL 94* 98*  CO2 25 28  GLUCOSE 182* 140*  BUN 26* 26*  CREATININE 0.86 0.85  CALCIUM 8.3* 8.0*   CMP     Component Value Date/Time   NA 134 (L) 05/29/2017 0631   K 3.6 05/29/2017 0631   CL 98 (L) 05/29/2017 0631   CO2 28 05/29/2017 0631   GLUCOSE 140 (H) 05/29/2017 0631   BUN 26 (H)  05/29/2017 0631   CREATININE 0.85 05/29/2017 0631   CALCIUM 8.0 (L) 05/29/2017 0631   PROT 5.8 (L) 05/29/2017 0242   ALBUMIN 2.8 (L) 05/29/2017 0242   AST 21 05/29/2017 0242   ALT 23 05/29/2017 0242   ALKPHOS 68 05/29/2017 0242   BILITOT 0.4 05/29/2017 0242   GFRNONAA >60 05/29/2017 0631   GFRAA >60 05/29/2017 0631   Lipase     Component Value Date/Time   LIPASE 31 05/14/2017 2106     Studies/Results: Dg Abd 1 View  Result Date: 05/29/2017 CLINICAL DATA:  66 year old female with nasogastric tube placement. Subsequent encounter. EXAM: ABDOMEN - 1 VIEW COMPARISON:  05/29/2017. FINDINGS: Nasogastric tube tip and side hole gastric body level. Gas distended small bowel loops with thickened folds consistent with small bowel obstruction once again noted. The possibility of free intraperitoneal air cannot be assessed on a supine view. IMPRESSION: Nasogastric tube tip and side hole gastric body level. Gas distended small bowel loops with thickened folds consistent with small bowel obstruction once again noted. Electronically Signed   By: Genia Del M.D.   On: 05/29/2017 07:06   Dg Abd Acute W/chest  Result Date: 05/29/2017 CLINICAL DATA:  History of cervical cancer, abdominal distention EXAM: DG ABDOMEN ACUTE W/ 1V CHEST COMPARISON:  05/17/2017, 05/15/2017 FINDINGS: Right-sided central venous port tip overlies the distal SVC. Small focus of atelectasis at the left base. No pleural effusion. Stable  enlarged cardiomediastinal silhouette. Supine and upright views of the abdomen demonstrate multiple dilated loops of small bowel measuring up to 5.2 cm consistent with mechanical small bowel obstruction. No obvious free air on decubitus view. IMPRESSION: 1. No radiographic evidence for acute cardiopulmonary abnormality 2. Multiple loops of dilated small bowel, consistent with small bowel obstruction, this appears worsened radiographically compared with 05/17/2017. Electronically Signed   By: Donavan Foil M.D.   On: 05/29/2017 03:31     Assessment/Plan SBO - XR shows multiple loops dilated SB, no free air or signs of perforation  - NGT in place - 50 cc output so far - can't do SBO protocol, patient is allergic to contrast.  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 Continue NGT to LIWS VTE - SCDs, hold lovenox for possible OR tomorrow ID - no current abx  Plan: Patient with worsened SBO compared to XR on 6/14. Will discuss with MD. Likely will need exploration with possible small bowel resection, maybe tomorrow.   LOS: 0 days    Brigid Re , [facility] 05/29/2017, 8:07 AM Pager: [phone] Consults: [phone] Mon-Fri 7:00 am-4:30 pm Sat-Sun 7:00 am-11:30 am

## 2017-05-29 NOTE — H&P (Signed)
History and Physical    Lori [name] QQV:[id number] DOB: [birth year] DOA: 05/29/2017  PCP: [name], MD  Patient coming from: Home.  Chief Complaint: Abdominal pain.  HPI: Lori [name] is a 66 y.o. female with history of cervical cancer status post chemoradiation last year and also had appendicular cancer status post surgery who was recently discharged 10 days ago after being treated for small bowel obstruction presents with complaint of increasing abdominal distention and having not moved bowels for last 4 days with nausea. Patient states after her discharge patient was on liquid diet. But last 4 days patient has been doing poorly with weakness and has not moved bowels. Persistent nausea but has not thrown up.   ED Course: X-rays in the ER shows features concerning for small bowel obstruction. On-call general surgeon Dr. Hassell Done was consulted and patient is being admitted for further management of small bowel obstruction. NG tube is in place. Patient's blood pressure is in the low normal. Labs also revealed hyponatremia.  Review of Systems: As per HPI, rest all negative.   Past Medical History:  Diagnosis Date  . Anemia 2017   3u blood 2017  . Arthritis    KNEES  . 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 This is raised a concern of malignancy. We'll plan to move forward with laparoscopic possible open appendectomy to rule out malignancy.   . Cervical cancer, FIGO stage IIB ([place]) 05/24/2015  . Cervical carcinoma [facility]) oncologist--  dr Sondra Come for radiation/   [facility] for chemotherapy   endocervical adenocarcinoma w/  Nodal METS--  FIGO Stage IIB  . Depression   . Endometrial carcinoma [facility])    goes to [facility] in [place] for chemotherapy  . GERD (gastroesophageal reflux disease)   . Heart murmur   .  History of blood transfusion   . 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 History of radiation therapy 07/28/15-08/26/15   cervical region 27.5 gray  . History of seizures    post-op craniotomy for aneurysm 1997-- controlled w/ medication since then no seizures  . Hyperlipidemia   . Hypertension   . PMB (postmenopausal bleeding)   . Pulmonary nodule 03/26/2017  . Seizures ([place])    hx of years ago   . Type 2 diabetes, diet controlled ([place])    diet controlled     Past Surgical History:  Procedure Laterality Date  . COLONOSCOPY    . CRANIOTOMY POSTERIOR FOSSA  1997   Repair aneurysm  x2  left side  . LAPAROSCOPIC APPENDECTOMY N/A 04/19/2016   Procedure: APPENDECTOMY LAPAROSCOPIC;  Surgeon: Hall Busing, MD;  Location: [facility];  Service: General;  Laterality: N/A;  . PORT-A-CATH PLACEMENT  06/  2016  . TANDEM RING INSERTION N/A 07/28/2015   Procedure: TANDEM RING PLACEMENT;  Surgeon: Gery Pray, MD;  Location: [facility];  Service: Urology;  Laterality: N/A;  . TANDEM RING INSERTION N/A 08/05/2015   Procedure: TANDEM RING PLACEMENT ;  Surgeon: Gery Pray, MD;  Location: [facility];  Service: Urology;  Laterality: N/A;  . TANDEM RING INSERTION N/A 08/11/2015   Procedure: TANDEM RING PLACEMENT ;  Surgeon: Gery Pray, MD;  Location: [facility];  Service: Urology;  Laterality: N/A;  . TANDEM RING  INSERTION N/A 08/17/2015   Procedure: TANDEM RING PLACEMENT ;  Surgeon: Gery Pray, MD;  Location: [facility];  Service: Urology;  Laterality: N/A;  . TANDEM RING INSERTION N/A 08/26/2015   Procedure: TANDEM RING PLACEMENT ;  Surgeon: Gery Pray, MD;  Location: [facility];  Service: Urology;  Laterality: N/A;     reports that she quit smoking about 21 years ago. Her smoking use included Cigarettes. She has a 1.25 pack-year smoking history. She  has never used smokeless tobacco. She reports that she does not drink alcohol or use drugs.  Allergies  Allergen Reactions  . Contrast Media [Iodinated Diagnostic Agents] Itching and Swelling  . Dilantin [Phenytoin Sodium Extended] Itching, Swelling and Other (See Comments)    Whole body  . Latex Hives and Itching  . Adhesive [Tape] Rash    Family History  Problem Relation Age of Onset  . Throat cancer Father   . Cervical cancer Sister     Prior to Admission medications   Medication Sig Start Date End Date Taking? Authorizing Provider  amLODipine (NORVASC) 10 MG tablet Take 10 mg by mouth every morning.  04/29/15  Yes [provider]  aspirin EC 81 MG tablet Take 81 mg by mouth daily.   Yes [provider]  benazepril (LOTENSIN) 40 MG tablet Take 40 mg by mouth every morning.  04/29/15  Yes [provider]  carbamazepine (TEGRETOL) 100 MG chewable tablet Chew 100 mg by mouth 3 (three) times daily.  04/29/15  Yes [provider]  cholecalciferol (VITAMIN D) 1000 units tablet Take 2,000 Units by mouth daily.    Yes [provider]  cloNIDine (CATAPRES) 0.2 MG tablet Take 0.2 mg by mouth 3 (three) times daily.  04/29/15  Yes [provider]  gabapentin (NEURONTIN) 300 MG capsule Take 300 mg by mouth 2 (two) times daily.  04/29/15  Yes [provider]  hydrochlorothiazide (HYDRODIURIL) 25 MG tablet Take 25 mg by mouth every morning.  04/29/15  Yes [provider]  loperamide (IMODIUM A-D) 2 MG tablet Take 2 mg by mouth 4 (four) times daily as needed for diarrhea or loose stools. Reported on 03/28/2016   Yes [provider]  magnesium oxide (MAG-OX) 400 (241.3 Mg) MG tablet Take 400 mg by mouth daily.  02/28/16  Yes [provider]  metoprolol (LOPRESSOR) 50 MG tablet Take 25 mg by mouth 2 (two) times daily.  04/29/15  Yes [provider]  pantoprazole (PROTONIX) 40 MG tablet Take 40 mg by mouth daily.    Yes [provider]  potassium chloride SA (K-DUR,KLOR-CON) 20 MEQ tablet Take 20 mEq by mouth 2 (two) times daily.  04/29/15  Yes [provider]  pravastatin (PRAVACHOL) 20 MG tablet Take 20 mg by mouth every evening.  04/29/15  Yes [provider]    Physical Exam: Vitals:   05/29/17 0001 05/29/17 0002 05/29/17 0427  BP:  103/79 (!) 76/50  Pulse:  97 90  Resp:  18 20  Temp:  98.3 F (36.8 C)   TempSrc:  Oral   SpO2:  98% 95%  Weight: 95.3 kg (210 lb)    Height: 5\' 8"  (1.727 m)        Constitutional: Moderately built and nourished. Vitals:   05/29/17 0001 05/29/17 0002 05/29/17 0427  BP:  103/79 (!) 76/50  Pulse:  97 90  Resp:  18 20  Temp:  98.3 F (36.8 C)   TempSrc:  Oral  SpO2:  98% 95%  Weight: 95.3 kg (210 lb)    Height: 5\' 8"  (1.727 m)     Eyes: Anicteric no pallor. ENMT: No discharge from the ears eyes nose and mouth. Neck: No mass felt. No neck rigidity. Respiratory: No rhonchi or crepitations. Cardiovascular: S1 and S2 heard no murmurs appreciated. Abdomen: Distended and bowel sounds not appreciated no guarding or rigidity. Musculoskeletal: No edema. No joint effusion. Skin: No rash. Skin appears warm. Neurologic: Alert awake oriented to time place and person. Moves all extremities. Psychiatric: Appears normal. Normal affect.   Labs on Admission: I have personally reviewed following labs and imaging studies  CBC:  Recent Labs Lab 05/29/17 0242  WBC 8.8  NEUTROABS 6.4  HGB 11.3*  HCT 33.7*  MCV 86.2  PLT 388   Basic Metabolic Panel:  Recent Labs Lab 05/29/17 0242  NA 129*  K 3.6  CL 94*  CO2 25  GLUCOSE 182*  BUN 26*  CREATININE 0.86  CALCIUM 8.3*   GFR: Estimated Creatinine Clearance: 77.7 mL/min (by C-G formula based on SCr of 0.86 mg/dL). Liver Function Tests:  Recent Labs Lab 05/29/17 0242  AST 21  ALT 23  ALKPHOS 68  BILITOT 0.4  PROT 5.8*  ALBUMIN 2.8*   No results for input(s): LIPASE,  AMYLASE in the last 168 hours. No results for input(s): AMMONIA in the last 168 hours. Coagulation Profile: No results for input(s): INR, PROTIME in the last 168 hours. Cardiac Enzymes: No results for input(s): CKTOTAL, CKMB, CKMBINDEX, TROPONINI in the last 168 hours. BNP (last 3 results) No results for input(s): PROBNP in the last 8760 hours. HbA1C: No results for input(s): HGBA1C in the last 72 hours. CBG: No results for input(s): GLUCAP in the last 168 hours. Lipid Profile: No results for input(s): CHOL, HDL, LDLCALC, TRIG, CHOLHDL, LDLDIRECT in the last 72 hours. Thyroid Function Tests: No results for input(s): TSH, T4TOTAL, FREET4, T3FREE, THYROIDAB in the last 72 hours. Anemia Panel: No results for input(s): VITAMINB12, FOLATE, FERRITIN, TIBC, IRON, RETICCTPCT in the last 72 hours. Urine analysis:    Component Value Date/Time   COLORURINE AMBER (A) 05/15/2017 0230   APPEARANCEUR CLEAR 05/15/2017 0230   LABSPEC 1.028 05/15/2017 0230   LABSPEC 1.010 08/17/2015 1542   PHURINE 5.0 05/15/2017 0230   GLUCOSEU NEGATIVE 05/15/2017 0230   GLUCOSEU 100 08/17/2015 1542   HGBUR NEGATIVE 05/15/2017 0230   BILIRUBINUR NEGATIVE 05/15/2017 0230   BILIRUBINUR Negative 08/17/2015 1542   KETONESUR NEGATIVE 05/15/2017 0230   PROTEINUR 30 (A) 05/15/2017 0230   UROBILINOGEN 1.0 08/26/2015 0725   UROBILINOGEN 0.2 08/17/2015 1542   NITRITE NEGATIVE 05/15/2017 0230   LEUKOCYTESUR NEGATIVE 05/15/2017 0230   LEUKOCYTESUR Trace 08/17/2015 1542   Sepsis Labs: @LABRCNTIP (procalcitonin:4,lacticidven:4) )No results found for this or any previous visit (from the past 240 hour(s)).   Radiological Exams on Admission: Dg Abd Acute W/chest  Result Date: 05/29/2017 CLINICAL DATA:  History of cervical cancer, abdominal distention EXAM: DG ABDOMEN ACUTE W/ 1V CHEST COMPARISON:  05/17/2017, 05/15/2017 FINDINGS: Right-sided central venous port tip overlies the distal SVC. Small focus of atelectasis at the  left base. No pleural effusion. Stable enlarged cardiomediastinal silhouette. Supine and upright views of the abdomen demonstrate multiple dilated loops of small bowel measuring up to 5.2 cm consistent with mechanical small bowel obstruction. No obvious free air on decubitus view. IMPRESSION: 1. No radiographic evidence for acute cardiopulmonary abnormality 2. Multiple loops of dilated small bowel, consistent with small bowel obstruction, this appears  worsened radiographically compared with 05/17/2017. Electronically Signed   By: Donavan Foil M.D.   On: 05/29/2017 03:31     Assessment/Plan Principal Problem:   SBO (small bowel obstruction) (HCC) Active Problems:   Cervical cancer, FIGO stage IIB (HCC)   HTN (hypertension)    1. Small bowel obstruction - NG tube has been ordered. General surgery has been consulted. Will keep patient nothing by mouth on IV fluids and further recommendations for general surgery. 2. Hyponatremia likely from dehydration - I have ordered a fluid bolus and IV fluids. Follow metabolic panel intake output. 3. History of hypertension - blood pressures in the low normal. Patient states she has been taking her medications regularly. Since patient is nothing by mouth I have placed patient on clonidine patch instead of by mouth clonidine. IV scheduled dose of metoprolol and when necessary IV hydralazine. 4. History of cervical cancer and appendicular cancer status post chemoradiation and surgery. 5. Chronic normocytic normochromic anemia - follow CBC.   DVT prophylaxis: SCDs. Code Status: Full code.  Family Communication: Discussed with patient.  Disposition Plan: Home.  Consults called: General surgery.  Admission status: Inpatient.    Rise Patience MD [facility] Pager [phone].  If 7PM-7AM, please contact night-coverage [email or web address] Password [facility]  05/29/2017, 4:33 AM

## 2017-05-29 NOTE — ED Provider Notes (Signed)
[facility] DEPT Provider Note   CSN: [id number] Arrival date & time: 05/28/17  2319  By signing my name below, I, Levester Fresh, attest that this documentation has been prepared under the direction and in the presence of Analycia Khokhar, Gwenyth Allegra, * . Electronically Signed: Levester Fresh, Scribe. 05/29/2017. 3:46 AM.   History   Chief Complaint Chief Complaint  Patient presents with  . Abdominal Pain   HPI Comments Lori [name] is a 66 y.o. 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  Pt was d/c x3 days ago with NG tube placement with outpatient f/u scheduled for Friday.  Last BM on Sunday, x2 days ago.  She reports passing flatulence today.  Pt denies experiencing any other acute sx, including nausea, vomiting, fevers or chills.  The history is provided by the patient and medical records. No language interpreter was used.    Past Medical History:  Diagnosis Date  . Anemia 2017   3u blood 2017  . Arthritis    KNEES  . Carcinoma of appendix ([place]) 04/11/2016   Patient with a history of cervical cancer status post chemoradiation with a persistent finding of a dilated appendix with hypermetabolism. This is raised a concern of malignancy. We'll plan to move forward with laparoscopic possible open appendectomy to rule out malignancy.   . Cervical cancer, FIGO stage IIB ([place]) 05/24/2015  . Cervical carcinoma [facility]) oncologist--  dr Sondra Come for radiation/   [facility] for chemotherapy   endocervical adenocarcinoma w/  Nodal METS--  FIGO Stage IIB  . Depression   . Endometrial carcinoma [facility])    goes to [facility] in [place] for chemotherapy  . GERD (gastroesophageal  reflux disease)   . Heart murmur   . History of blood transfusion   . 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 History of radiation therapy 07/28/15-08/26/15   cervical region 27.5 gray  . History of seizures    post-op craniotomy for aneurysm 1997-- controlled w/ medication since then no seizures  . Hyperlipidemia   . Hypertension   . PMB (postmenopausal bleeding)   . Pulmonary nodule 03/26/2017  . Seizures ([address])    hx of years ago   . Type 2 diabetes, diet controlled ([place])    diet controlled     Patient Active Problem List   Diagnosis Date Noted  . SBO (small bowel obstruction) ([place]) 05/15/2017  . HTN (hypertension) 05/15/2017  . Pulmonary nodule 03/26/2017  . Carcinoma of appendix ([place]) 04/11/2016  . 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 Cervical cancer, FIGO stage IIB ([place]) 05/24/2015    Past Surgical History:  Procedure Laterality Date  . COLONOSCOPY    . CRANIOTOMY POSTERIOR FOSSA  1997   Repair aneurysm  x2  left side  . LAPAROSCOPIC APPENDECTOMY N/A 04/19/2016   Procedure: APPENDECTOMY LAPAROSCOPIC;  Surgeon: Hall Busing, MD;  Location: [facility];  Service: General;  Laterality: N/A;  . PORT-A-CATH PLACEMENT  06/  2016  . TANDEM RING INSERTION N/A 07/28/2015   Procedure: TANDEM RING PLACEMENT;  Surgeon: Gery Pray, MD;  Location: [facility];  Service: Urology;  Laterality: N/A;  .  TANDEM RING INSERTION N/A 08/05/2015   Procedure: TANDEM RING PLACEMENT ;  Surgeon: Gery Pray, MD;  Location: [facility];  Service: Urology;  Laterality: N/A;  . TANDEM RING INSERTION N/A 08/11/2015   Procedure: TANDEM RING PLACEMENT ;  Surgeon: Gery Pray, MD;  Location: [facility];  Service: Urology;  Laterality: N/A;  . TANDEM RING INSERTION N/A 08/17/2015   Procedure: TANDEM RING PLACEMENT ;  Surgeon: Gery Pray, MD;  Location:  [facility];  Service: Urology;  Laterality: N/A;  . TANDEM RING INSERTION N/A 08/26/2015   Procedure: TANDEM RING PLACEMENT ;  Surgeon: Gery Pray, MD;  Location: [facility];  Service: Urology;  Laterality: N/A;    OB History    No data available       Home Medications    Prior to Admission medications   Medication Sig Start Date End Date Taking? Authorizing Provider  amLODipine (NORVASC) 10 MG tablet Take 10 mg by mouth every morning.  04/29/15   [provider]  aspirin EC 81 MG tablet Take 81 mg by mouth daily.    [provider]  benazepril (LOTENSIN) 40 MG tablet Take 40 mg by mouth every morning.  04/29/15   [provider]  carbamazepine (TEGRETOL) 100 MG chewable tablet Chew 100 mg by mouth 3 (three) times daily.  04/29/15   [provider]  cholecalciferol (VITAMIN D) 1000 units tablet Take 1,000 Units by mouth daily. 2 tablets daily    [provider]  cloNIDine (CATAPRES) 0.2 MG tablet Take 0.2 mg by mouth 3 (three) times daily.  04/29/15   [provider]  gabapentin (NEURONTIN) 300 MG capsule Take 300 mg by mouth 2 (two) times daily.  04/29/15   [provider]  hydrochlorothiazide (HYDRODIURIL) 25 MG tablet Take 25 mg by mouth every morning.  04/29/15   [provider]  loperamide (IMODIUM A-D) 2 MG tablet Take 2 mg by mouth 4 (four) times daily as needed for diarrhea or loose stools. Reported on 03/28/2016    [provider]  magnesium oxide (MAG-OX) 400 (241.3 Mg) MG tablet Take 400 mg by mouth daily.  02/28/16   [provider]  metoprolol (LOPRESSOR) 50 MG tablet Take 25 mg by mouth 2 (two) times daily.  04/29/15   [provider]  potassium chloride SA (K-DUR,KLOR-CON) 20 MEQ tablet Take 20 mEq by mouth 2 (two) times daily.  04/29/15   [provider]  pravastatin (PRAVACHOL) 20 MG tablet Take 20 mg by mouth every evening.  04/29/15    [provider]    Family History Family History  Problem Relation Age of Onset  . Throat cancer Father   . Cervical cancer Sister     Social History Social History  Substance Use Topics  . Smoking status: Former Smoker    Packs/day: 0.25    Years: 5.00    Types: Cigarettes    Quit date: 12/05/1995  . Smokeless tobacco: Never Used  . Alcohol use No     Allergies   Contrast media [iodinated diagnostic agents]; Dilantin [phenytoin sodium extended]; Latex; and Adhesive [tape]   Review of Systems Review of Systems  Constitutional: Negative for chills and fever.  HENT: Negative for trouble swallowing.   Respiratory: Negative for shortness of breath.   Cardiovascular: Negative for chest pain.  Gastrointestinal: Positive for abdominal distention. Negative for abdominal pain, nausea and vomiting.  Musculoskeletal: Negative for arthralgias and myalgias.  Skin:  Negative for rash.  Neurological: Negative for weakness and numbness.  All other systems reviewed and are negative.  Physical Exam Updated Vital Signs BP 103/79 (BP Location: Left Arm)   Pulse 97   Temp 98.3 F (36.8 C) (Oral)   Resp 18   Ht 5\' 8"  (1.727 m)   Wt 95.3 kg (210 lb)   SpO2 98%   BMI 31.93 kg/m   Physical Exam  Constitutional: She is oriented to person, place, and time. She appears well-developed and well-nourished. No distress.  HENT:  Head: Normocephalic and atraumatic.  Right Ear: Hearing normal.  Left Ear: Hearing normal.  Nose: Nose normal.  Mouth/Throat: Oropharynx is clear and moist and mucous membranes are normal.  Eyes: Conjunctivae and EOM are normal. Pupils are equal, round, and reactive to light.  Neck: Normal range of motion. Neck supple.  Cardiovascular: Regular rhythm, S1 normal and S2 normal.  Exam reveals no gallop and no friction rub.   No murmur heard. Pulmonary/Chest: Effort normal and breath sounds normal. No respiratory distress. She exhibits no tenderness.    Abdominal: Soft. Normal appearance and bowel sounds are normal. She exhibits distension. There is no hepatosplenomegaly. There is no tenderness. There is no rebound, no guarding, no tenderness at McBurney's point and negative Murphy's sign. No hernia.  Typanitic. Bowel sounds are absent.   Musculoskeletal: Normal range of motion.  Neurological: She is alert and oriented to person, place, and time. She has normal strength. No cranial nerve deficit or sensory deficit. Coordination normal. GCS eye subscore is 4. GCS verbal subscore is 5. GCS motor subscore is 6.  Skin: Skin is warm, dry and intact. No rash noted. No cyanosis.  Psychiatric: She has a normal mood and affect. Her speech is normal and behavior is normal. Thought content normal.  Nursing note and vitals reviewed.  ED Treatments / Results  DIAGNOSTIC STUDIES: Oxygen Saturation is 98% on room air, normal by my interpretation.    COORDINATION OF CARE: 2:36 AM Discussed treatment plan with pt at bedside and pt agreed to plan.  Labs (all labs ordered are listed, but only abnormal results are displayed) Labs Reviewed  CBC WITH DIFFERENTIAL/PLATELET - Abnormal; Notable for the following:       Result Value   Hemoglobin 11.3 (*)    HCT 33.7 (*)    All other components within normal limits  COMPREHENSIVE METABOLIC PANEL - Abnormal; Notable for the following:    Sodium 129 (*)    Chloride 94 (*)    Glucose, Bld 182 (*)    BUN 26 (*)    Calcium 8.3 (*)    Total Protein 5.8 (*)    Albumin 2.8 (*)    All other components within normal limits  I-STAT CG4 LACTIC ACID, ED    EKG  EKG Interpretation None       Radiology Dg Abd Acute W/chest  Result Date: 05/29/2017 CLINICAL DATA:  History of cervical cancer, abdominal distention EXAM: DG ABDOMEN ACUTE W/ 1V CHEST COMPARISON:  05/17/2017, 05/15/2017 FINDINGS: Right-sided central venous port tip overlies the distal SVC. Small focus of atelectasis at the left base. No pleural  effusion. Stable enlarged cardiomediastinal silhouette. Supine and upright views of the abdomen demonstrate multiple dilated loops of small bowel measuring up to 5.2 cm consistent with mechanical small bowel obstruction. No obvious free air on decubitus view. IMPRESSION: 1. No radiographic evidence for acute cardiopulmonary abnormality 2. Multiple loops of dilated small bowel, consistent with small bowel obstruction, this appears  worsened radiographically compared with 05/17/2017. Electronically Signed   By: Donavan Foil M.D.   On: 05/29/2017 03:31    Procedures Procedures (including critical care time)  Medications Ordered in ED Medications  morphine 4 MG/ML injection 4 mg (not administered)  ondansetron (ZOFRAN) injection 4 mg (not administered)     Initial Impression / Assessment and Plan / ED Course  I have reviewed the triage vital signs and the nursing notes.  Pertinent labs & imaging results that were available during my care of the patient were reviewed by me and considered in my medical decision making (see chart for details).     Patient presents to the emergency department for evaluation of abdominal pain and distention. Patient has had 2 hospitalizations recently for bowel obstruction. Patient was just discharged 2 days ago. She reports that she was doing well at time of discharge, no pain and had a normal bowel movement yesterday morning. Since then, however, she noticed increasing abdominal pain and distention. She passed some gas earlier this morning but none since.  At arrival to the ER, patient's abdomen is distended with absent bowel sounds. Plain film shows evidence of high-grade obstruction once again. Patient will have NG tube placed and will require repeat hospitalization.  Discussed with Dr. Hassell Done, on-call for general surgery. Agrees with NG tube placement, recommends admission to medicine. General surgery will see in the morning.  I personally performed the  services described in this documentation, which was scribed in my presence. The recorded information has been reviewed and is accurate.  Final Clinical Impressions(s) / ED Diagnoses   Final diagnoses:  SBO (small bowel obstruction) ([place])    New Prescriptions New Prescriptions   No medications on file     Orpah Greek, MD 05/29/17 [phone]

## 2017-05-30 ENCOUNTER — Encounter (HOSPITAL_COMMUNITY): Payer: Self-pay | Admitting: Anesthesiology

## 2017-05-30 ENCOUNTER — Inpatient Hospital Stay (HOSPITAL_COMMUNITY): Payer: Medicare Other | Admitting: Anesthesiology

## 2017-05-30 ENCOUNTER — Encounter (HOSPITAL_COMMUNITY)
Admission: EM | Disposition: A | Discharge status: Skilled Nursing Facility | Payer: Self-pay | Source: Home / Self Care | Attending: Pulmonary Disease

## 2017-05-30 DIAGNOSIS — R569 Unspecified convulsions: Secondary | ICD-10-CM

## 2017-05-30 HISTORY — PX: LAPAROSCOPY: SHX197

## 2017-05-30 HISTORY — PX: LAPAROTOMY: SHX154

## 2017-05-30 LAB — GLUCOSE, CAPILLARY
Glucose-Capillary: 101 mg/dL — ABNORMAL HIGH (ref 65–99)
Glucose-Capillary: 97 mg/dL (ref 65–99)
Glucose-Capillary: 126 mg/dL — ABNORMAL HIGH (ref 65–99)
Glucose-Capillary: 116 mg/dL — ABNORMAL HIGH (ref 65–99)
Glucose-Capillary: 138 mg/dL — ABNORMAL HIGH (ref 65–99)

## 2017-05-30 LAB — CBC
HEMATOCRIT: 32.5 % — AB (ref 36.0–46.0)
HEMOGLOBIN: 10.7 g/dL — AB (ref 12.0–15.0)
MCH: 28.8 pg (ref 26.0–34.0)
MCHC: 32.9 g/dL (ref 30.0–36.0)
MCV: 87.4 fL (ref 78.0–100.0)
Platelets: 364 10*3/uL (ref 150–400)
RBC: 3.72 MIL/uL — ABNORMAL LOW (ref 3.87–5.11)
RDW: 14.4 % (ref 11.5–15.5)
WBC: 9.1 10*3/uL (ref 4.0–10.5)

## 2017-05-30 LAB — BASIC METABOLIC PANEL
ANION GAP: 10 (ref 5–15)
BUN: 21 mg/dL — AB (ref 6–20)
CALCIUM: 8 mg/dL — AB (ref 8.9–10.3)
CO2: 24 mmol/L (ref 22–32)
CREATININE: 0.88 mg/dL (ref 0.44–1.00)
Chloride: 102 mmol/L (ref 101–111)
GFR calc Af Amer: >60 mL/min (ref >60)
GFR calc non Af Amer: >60 mL/min (ref >60)
GLUCOSE: 96 mg/dL (ref 65–99)
Potassium: 4.1 mmol/L (ref 3.5–5.1)
Sodium: 136 mmol/L (ref 135–145)

## 2017-05-30 LAB — MAGNESIUM: Magnesium: 1.8 mg/dL (ref 1.7–2.4)

## 2017-05-30 LAB — TYPE AND SCREEN
ABO/RH(D): O POS
Antibody Screen: NEGATIVE

## 2017-05-30 SURGERY — LAPAROSCOPY, DIAGNOSTIC
Anesthesia: General | Site: Abdomen

## 2017-05-30 MED ORDER — ACETAMINOPHEN 10 MG/ML IV SOLN
INTRAVENOUS | Status: AC
  Administered 2017-05-30: 1000 mg
  Filled 2017-05-30: qty 100

## 2017-05-30 MED ORDER — HYDROMORPHONE HCL 1 MG/ML IJ SOLN
0.2500 mg | INTRAMUSCULAR | Status: DC | PRN
  Administered 2017-05-30: 0.5 mg via INTRAVENOUS
  Administered 2017-05-30 (×3): 0.25 mg via INTRAVENOUS

## 2017-05-30 MED ORDER — PROPOFOL 10 MG/ML IV BOLUS
INTRAVENOUS | Status: DC | PRN
  Administered 2017-05-30: 150 mg via INTRAVENOUS

## 2017-05-30 MED ORDER — BUPIVACAINE HCL (PF) 0.5 % IJ SOLN
INTRAMUSCULAR | Status: AC
  Filled 2017-05-30: qty 30

## 2017-05-30 MED ORDER — DIPHENHYDRAMINE HCL 50 MG/ML IJ SOLN: 12.5000 mg | Freq: Four times a day (QID) | INTRAMUSCULAR | Status: DC | PRN

## 2017-05-30 MED ORDER — SODIUM CHLORIDE 0.9 % IV SOLN
500.0000 mg | Freq: Two times a day (BID) | INTRAVENOUS | Status: DC
  Administered 2017-05-31 – 2017-06-07 (×16): 500 mg via INTRAVENOUS
  Filled 2017-05-30 (×19): qty 5

## 2017-05-30 MED ORDER — MIDAZOLAM HCL 2 MG/2ML IJ SOLN
INTRAMUSCULAR | Status: AC
  Filled 2017-05-30: qty 2

## 2017-05-30 MED ORDER — KETAMINE HCL 10 MG/ML IJ SOLN
INTRAMUSCULAR | Status: DC | PRN
Start: 2017-05-30 — End: 2017-05-30
  Administered 2017-05-30: 20 mg via INTRAVENOUS

## 2017-05-30 MED ORDER — ACETAMINOPHEN 650 MG RE SUPP: 650.0000 mg | Freq: Four times a day (QID) | RECTAL | Status: DC | PRN

## 2017-05-30 MED ORDER — NALOXONE HCL 0.4 MG/ML IJ SOLN
0.4000 mg | INTRAMUSCULAR | Status: DC | PRN
  Administered 2017-05-31: 0.4 mg via INTRAVENOUS

## 2017-05-30 MED ORDER — DEXAMETHASONE SODIUM PHOSPHATE 10 MG/ML IJ SOLN
INTRAMUSCULAR | Status: AC
  Filled 2017-05-30: qty 1

## 2017-05-30 MED ORDER — ALBUMIN HUMAN 5 % IV SOLN
INTRAVENOUS | Status: AC
  Filled 2017-05-30: qty 250

## 2017-05-30 MED ORDER — BUPIVACAINE HCL (PF) 0.5 % IJ SOLN
INTRAMUSCULAR | Status: DC | PRN
  Administered 2017-05-30: 20 mL

## 2017-05-30 MED ORDER — MIDAZOLAM HCL 5 MG/5ML IJ SOLN
INTRAMUSCULAR | Status: DC | PRN
  Administered 2017-05-30 (×2): 1 mg via INTRAVENOUS

## 2017-05-30 MED ORDER — ROCURONIUM BROMIDE 50 MG/5ML IV SOSY
PREFILLED_SYRINGE | INTRAVENOUS | Status: AC
  Filled 2017-05-30: qty 5

## 2017-05-30 MED ORDER — SUCCINYLCHOLINE CHLORIDE 200 MG/10ML IV SOSY
PREFILLED_SYRINGE | INTRAVENOUS | Status: DC | PRN
  Administered 2017-05-30: 120 mg via INTRAVENOUS

## 2017-05-30 MED ORDER — HYDROMORPHONE HCL 1 MG/ML IJ SOLN
INTRAMUSCULAR | Status: AC
  Filled 2017-05-30: qty 1

## 2017-05-30 MED ORDER — PHENYLEPHRINE HCL 10 MG/ML IJ SOLN
INTRAVENOUS | Status: DC | PRN
  Administered 2017-05-30: 20 ug/min via INTRAVENOUS

## 2017-05-30 MED ORDER — 0.9 % SODIUM CHLORIDE (POUR BTL) OPTIME
TOPICAL | Status: DC | PRN
  Administered 2017-05-30: 5000 mL

## 2017-05-30 MED ORDER — SODIUM CHLORIDE 0.9% FLUSH: 9.0000 mL | INTRAVENOUS | Status: DC | PRN

## 2017-05-30 MED ORDER — DIPHENHYDRAMINE HCL 12.5 MG/5ML PO ELIX: 12.5000 mg | ORAL_SOLUTION | Freq: Four times a day (QID) | ORAL | Status: DC | PRN

## 2017-05-30 MED ORDER — FENTANYL CITRATE (PF) 100 MCG/2ML IJ SOLN
INTRAMUSCULAR | Status: AC
  Filled 2017-05-30: qty 2

## 2017-05-30 MED ORDER — ALBUMIN HUMAN 5 % IV SOLN
12.5000 g | Freq: Once | INTRAVENOUS | Status: AC
  Administered 2017-05-30: 12.5 g via INTRAVENOUS

## 2017-05-30 MED ORDER — HYDROMORPHONE HCL 1 MG/ML IJ SOLN
INTRAMUSCULAR | Status: AC
  Administered 2017-05-30: 0.25 mg via INTRAVENOUS
  Filled 2017-05-30: qty 1

## 2017-05-30 MED ORDER — PROPOFOL 10 MG/ML IV BOLUS
INTRAVENOUS | Status: AC
  Filled 2017-05-30: qty 20

## 2017-05-30 MED ORDER — ACETAMINOPHEN 10 MG/ML IV SOLN: 1000.0000 mg | Freq: Once | INTRAVENOUS | Status: DC

## 2017-05-30 MED ORDER — KCL IN DEXTROSE-NACL 20-5-0.9 MEQ/L-%-% IV SOLN: INTRAVENOUS | Status: DC

## 2017-05-30 MED ORDER — PROMETHAZINE HCL 25 MG/ML IJ SOLN: 6.2500 mg | INTRAMUSCULAR | Status: DC | PRN

## 2017-05-30 MED ORDER — MORPHINE SULFATE 2 MG/ML IV SOLN
INTRAVENOUS | Status: DC
  Administered 2017-05-30: 17:00:00 via INTRAVENOUS
  Administered 2017-05-30: 9 mg via INTRAVENOUS
  Administered 2017-05-31: 4.5 mg via INTRAVENOUS
  Administered 2017-05-31: 9 mg via INTRAVENOUS
  Filled 2017-05-30: qty 25
  Filled 2017-05-30: qty 30

## 2017-05-30 MED ORDER — PHENYLEPHRINE 40 MCG/ML (10ML) SYRINGE FOR IV PUSH (FOR BLOOD PRESSURE SUPPORT)
PREFILLED_SYRINGE | INTRAVENOUS | Status: AC
  Filled 2017-05-30: qty 10

## 2017-05-30 MED ORDER — DEXAMETHASONE SODIUM PHOSPHATE 10 MG/ML IJ SOLN
INTRAMUSCULAR | Status: DC | PRN
  Administered 2017-05-30: 10 mg via INTRAVENOUS

## 2017-05-30 MED ORDER — SODIUM CHLORIDE 0.9% FLUSH
10.0000 mL | INTRAVENOUS | Status: DC | PRN
  Administered 2017-06-05 – 2017-06-06 (×2): 10 mL
  Filled 2017-05-30 (×2): qty 40

## 2017-05-30 MED ORDER — LACTATED RINGERS IV SOLN
INTRAVENOUS | Status: DC
  Administered 2017-05-30: 16:00:00 via INTRAVENOUS

## 2017-05-30 MED ORDER — SODIUM CHLORIDE 0.9 % IV BOLUS (SEPSIS)
1000.0000 mL | Freq: Once | INTRAVENOUS | Status: AC
  Administered 2017-05-30: 1000 mL via INTRAVENOUS

## 2017-05-30 MED ORDER — FENTANYL CITRATE (PF) 100 MCG/2ML IJ SOLN
INTRAMUSCULAR | Status: DC | PRN
  Administered 2017-05-30: 100 ug via INTRAVENOUS
  Administered 2017-05-30 (×3): 50 ug via INTRAVENOUS

## 2017-05-30 MED ORDER — PHENYLEPHRINE HCL 10 MG/ML IJ SOLN
INTRAMUSCULAR | Status: AC
  Filled 2017-05-30: qty 1

## 2017-05-30 MED ORDER — ONDANSETRON HCL 4 MG/2ML IJ SOLN: 4.0000 mg | Freq: Four times a day (QID) | INTRAMUSCULAR | Status: DC | PRN

## 2017-05-30 MED ORDER — CEFAZOLIN SODIUM-DEXTROSE 2-4 GM/100ML-% IV SOLN
INTRAVENOUS | Status: AC
  Filled 2017-05-30: qty 100

## 2017-05-30 MED ORDER — KCL IN DEXTROSE-NACL 20-5-0.9 MEQ/L-%-% IV SOLN
INTRAVENOUS | Status: DC
  Administered 2017-05-30: 22:00:00 via INTRAVENOUS
  Filled 2017-05-30 (×2): qty 1000

## 2017-05-30 MED ORDER — LACTATED RINGERS IR SOLN
Status: DC | PRN
  Administered 2017-05-30: 1000 mL

## 2017-05-30 MED ORDER — PHENYLEPHRINE 40 MCG/ML (10ML) SYRINGE FOR IV PUSH (FOR BLOOD PRESSURE SUPPORT)
PREFILLED_SYRINGE | INTRAVENOUS | Status: DC | PRN
  Administered 2017-05-30 (×4): 80 ug via INTRAVENOUS

## 2017-05-30 MED ORDER — LACTATED RINGERS IV SOLN
INTRAVENOUS | Status: DC | PRN
  Administered 2017-05-30 (×3): via INTRAVENOUS

## 2017-05-30 MED ORDER — SODIUM CHLORIDE 0.9 % IV BOLUS (SEPSIS)
500.0000 mL | Freq: Once | INTRAVENOUS | Status: AC
  Administered 2017-05-30: 500 mL via INTRAVENOUS

## 2017-05-30 MED ORDER — PIPERACILLIN-TAZOBACTAM 3.375 G IVPB
3.3750 g | Freq: Three times a day (TID) | INTRAVENOUS | Status: DC
  Administered 2017-05-30 – 2017-06-06 (×20): 3.375 g via INTRAVENOUS
  Filled 2017-05-30 (×23): qty 50

## 2017-05-30 MED ORDER — ONDANSETRON HCL 4 MG/2ML IJ SOLN
INTRAMUSCULAR | Status: AC
  Filled 2017-05-30: qty 2

## 2017-05-30 MED ORDER — LIDOCAINE 2% (20 MG/ML) 5 ML SYRINGE
INTRAMUSCULAR | Status: DC | PRN
  Administered 2017-05-30: 50 mg via INTRAVENOUS

## 2017-05-30 MED ORDER — ROCURONIUM BROMIDE 10 MG/ML (PF) SYRINGE
PREFILLED_SYRINGE | INTRAVENOUS | Status: DC | PRN
  Administered 2017-05-30 (×2): 10 mg via INTRAVENOUS
  Administered 2017-05-30: 40 mg via INTRAVENOUS
  Administered 2017-05-30 (×3): 10 mg via INTRAVENOUS

## 2017-05-30 MED ORDER — MEPERIDINE HCL 50 MG/ML IJ SOLN: 6.2500 mg | INTRAMUSCULAR | Status: DC | PRN

## 2017-05-30 MED ORDER — SUGAMMADEX SODIUM 200 MG/2ML IV SOLN
INTRAVENOUS | Status: DC | PRN
  Administered 2017-05-30: 200 mg via INTRAVENOUS

## 2017-05-30 SURGICAL SUPPLY — 51 items
BENZOIN TINCTURE PRP APPL 2/3 (GAUZE/BANDAGES/DRESSINGS) IMPLANT
BLADE EXTENDED COATED 6.5IN (ELECTRODE) IMPLANT
BLADE HEX COATED 2.75 (ELECTRODE) IMPLANT
BNDG GAUZE ELAST 4 BULKY (GAUZE/BANDAGES/DRESSINGS) ×3 IMPLANT
CABLE HIGH FREQUENCY MONO STRZ (ELECTRODE) ×6 IMPLANT
CHLORAPREP W/TINT 26ML (MISCELLANEOUS) ×3 IMPLANT
DECANTER SPIKE VIAL GLASS SM (MISCELLANEOUS) IMPLANT
DRAPE UTILITY XL STRL (DRAPES) ×3 IMPLANT
ELECT PENCIL ROCKER SW 15FT (MISCELLANEOUS) ×3 IMPLANT
ELECT REM PT RETURN 15FT ADLT (MISCELLANEOUS) ×3 IMPLANT
GLOVE BIOGEL PI IND STRL 7.0 (GLOVE) ×2 IMPLANT
GLOVE BIOGEL PI INDICATOR 7.0 (GLOVE) ×1
GLOVE ECLIPSE 8.0 STRL XLNG CF (GLOVE) ×3 IMPLANT
GLOVE INDICATOR 8.0 STRL GRN (GLOVE) ×6 IMPLANT
GOWN STRL REUS W/TWL LRG LVL3 (GOWN DISPOSABLE) ×3 IMPLANT
GOWN STRL REUS W/TWL XL LVL3 (GOWN DISPOSABLE) ×6 IMPLANT
HANDLE SUCTION POOLE (INSTRUMENTS) ×2 IMPLANT
IRRIG SUCT STRYKERFLOW 2 WTIP (MISCELLANEOUS)
IRRIGATION SUCT STRKRFLW 2 WTP (MISCELLANEOUS) IMPLANT
KIT BASIN OR (CUSTOM PROCEDURE TRAY) ×3 IMPLANT
RELOAD PROXIMATE 75MM BLUE (ENDOMECHANICALS) ×6 IMPLANT
SHEARS HARMONIC ACE PLUS 36CM (ENDOMECHANICALS) ×3 IMPLANT
SOLUTION ANTI FOG 6CC (MISCELLANEOUS) ×3 IMPLANT
STAPLER GUN LINEAR PROX 60 (STAPLE) ×3 IMPLANT
STAPLER PROXIMATE 75MM BLUE (STAPLE) ×3 IMPLANT
STRIP CLOSURE SKIN 1/2X4 (GAUZE/BANDAGES/DRESSINGS) IMPLANT
SUCTION POOLE HANDLE (INSTRUMENTS) ×3
SUT NOVA NAB DX-16 0-1 5-0 T12 (SUTURE) ×6 IMPLANT
SUT NOVA T20/GS 25 (SUTURE) ×6 IMPLANT
SUT SILK 2 0 (SUTURE) ×1
SUT SILK 2 0 SH CR/8 (SUTURE) ×3 IMPLANT
SUT SILK 2-0 18XBRD TIE 12 (SUTURE) ×2 IMPLANT
SUT SILK 3 0 (SUTURE) ×1
SUT SILK 3 0 SH CR/8 (SUTURE) ×12 IMPLANT
SUT SILK 3-0 18XBRD TIE 12 (SUTURE) ×2 IMPLANT
SUT VIC AB 3-0 SH 27 (SUTURE) ×2
SUT VIC AB 3-0 SH 27XBRD (SUTURE) ×4 IMPLANT
SUT VIC AB 4-0 PS2 27 (SUTURE) IMPLANT
SUT VICRYL 2 0 18  UND BR (SUTURE)
SUT VICRYL 2 0 18 UND BR (SUTURE) IMPLANT
TAPE CLOTH SURG 4X10 WHT LF (GAUZE/BANDAGES/DRESSINGS) ×3 IMPLANT
TOWEL OR 17X26 10 PK STRL BLUE (TOWEL DISPOSABLE) ×6 IMPLANT
TOWEL OR NON WOVEN STRL DISP B (DISPOSABLE) ×3 IMPLANT
TRAY FOLEY W/METER SILVER 16FR (SET/KITS/TRAYS/PACK) IMPLANT
TRAY LAPAROSCOPIC (CUSTOM PROCEDURE TRAY) ×3 IMPLANT
TROCAR XCEL BLUNT TIP 100MML (ENDOMECHANICALS) IMPLANT
TROCAR XCEL NON-BLD 11X100MML (ENDOMECHANICALS) IMPLANT
TROCAR XCEL UNIV SLVE 11M 100M (ENDOMECHANICALS) IMPLANT
TUBING INSUF HEATED (TUBING) IMPLANT
YANKAUER SUCT BULB TIP 10FT TU (MISCELLANEOUS) ×6 IMPLANT
YANKAUER SUCT BULB TIP NO VENT (SUCTIONS) IMPLANT

## 2017-05-30 NOTE — Anesthesia Preprocedure Evaluation (Addendum)
Anesthesia Evaluation  Patient identified by MRN, date of birth, ID band Patient awake    Reviewed: Allergy & Precautions, NPO status , Patient's Chart, lab work & pertinent test results  History of Anesthesia Complications Negative for: history of anesthetic complications  Airway Mallampati: II  TM Distance: >3 FB Neck ROM: Full    Dental  (+) Poor Dentition, Chipped, Missing, Dental Advisory Given   Pulmonary former smoker,  Newly diagnosed Lung cancer   breath sounds clear to auscultation       Cardiovascular hypertension, Pt. on medications and Pt. on home beta blockers (-) angina Rhythm:Regular Rate:Normal     Neuro/Psych Seizures - (over 20 years ago with SAH/cerebral aneurysm clipping), Well Controlled,  PSYCHIATRIC DISORDERS Depression    GI/Hepatic Neg liver ROS, GERD  Controlled,  Endo/Other  diabetes, Type 2Morbid obesity  Renal/GU negative Renal ROS  negative genitourinary   Musculoskeletal  (+) Arthritis , Osteoarthritis,    Abdominal (+) + obese,   Peds negative pediatric ROS (+)  Hematology negative hematology ROS (+)   Anesthesia Other Findings - HLD Cervical cancer   Reproductive/Obstetrics negative OB ROS                            Lab Results  Component Value Date   WBC 9.1 05/30/2017   HGB 10.7 (L) 05/30/2017   HCT 32.5 (L) 05/30/2017   MCV 87.4 05/30/2017   PLT 364 05/30/2017   Lab Results  Component Value Date   CREATININE 0.88 05/30/2017   BUN 21 (H) 05/30/2017   NA 136 05/30/2017   K 4.1 05/30/2017   CL 102 05/30/2017   CO2 24 05/30/2017   Lab Results  Component Value Date   INR 1.09 05/16/2017   04/2016: EKG: sinus bradycardia, 1st degree AV block.  Anesthesia Physical Anesthesia Plan  ASA: III  Anesthesia Plan: General   Post-op Pain Management:    Induction: Intravenous and Rapid sequence  PONV Risk Score and Plan: 4 or greater and  Ondansetron, Dexamethasone, Midazolam and Scopolamine patch - Pre-op  Airway Management Planned: Oral ETT  Additional Equipment:   Intra-op Plan:   Post-operative Plan: Possible Post-op intubation/ventilation  Informed Consent: I have reviewed the patients History and Physical, chart, labs and discussed the procedure including the risks, benefits and alternatives for the proposed anesthesia with the patient or authorized representative who has indicated his/her understanding and acceptance.   Dental advisory given  Plan Discussed with: CRNA and Surgeon  Anesthesia Plan Comments: (Plan routine monitors, GETA with possible post op analgesia)       Anesthesia Quick Evaluation

## 2017-05-30 NOTE — Progress Notes (Signed)
Emergent restraint placed due to restlessness and pulling at NG tube.

## 2017-05-30 NOTE — Progress Notes (Signed)
Patient returned from surgery. IV site out. Pt is to start morphine PCA. Unable to achieve new site. IV team consulted.

## 2017-05-30 NOTE — Progress Notes (Signed)
Called Dr. Glennon Mac about increasing pain with a bp of 109/73 despite giving 0.5 Dilaudid. Per MD will give IV Tylenol and IV Albumin.

## 2017-05-30 NOTE — Progress Notes (Signed)
[facility] Progress Note     Subjective: CC: Feels hungry Patient with abdominal pain which she attributes to hunger. Denies nausea or vomiting. Passing flatus yesterday, none today. UOP good. No questions or concerns regarding surgery today.   Objective: Vital signs in last 24 hours: Temp:  [97.7 F (36.5 C)-99 F (37.2 C)] 98.3 F (36.8 C) (06/27 0517) Pulse Rate:  [80-106] 98 (06/27 0517) Resp:  [18] 18 (06/27 0517) BP: (105-145)/(62-94) 141/94 (06/27 0517) SpO2:  [98 %-100 %] 98 % (06/27 0517) Last BM Date: 05/27/17  Intake/Output from previous day: 06/26 0701 - 06/27 0700 In: 1591.3 [I.V.:1561.3; NG/GT:30] Out: 1600 [Emesis/NG output:1600] Intake/Output this shift: No intake/output data recorded.  PE: Gen:  Alert, NAD, pleasant Card:  Regular rate and rhythm, no M/G/R appreciated Pulm:  Normal effort, clear to auscultation bilaterally Abd: Soft, non-tender, non-distended, bowel sounds hypoactive, no HSM Skin: warm and dry, no rashes  Psych: A&Ox3   Lab Results:   Recent Labs  05/29/17 0242 05/29/17 0631  WBC 8.8 9.1  HGB 11.3* 10.7*  HCT 33.7* 32.0*  PLT 385 322   BMET  Recent Labs  05/29/17 0242 05/29/17 0631  NA 129* 134*  K 3.6 3.6  CL 94* 98*  CO2 25 28  GLUCOSE 182* 140*  BUN 26* 26*  CREATININE 0.86 0.85  CALCIUM 8.3* 8.0*   CMP     Component Value Date/Time   NA 134 (L) 05/29/2017 0631   K 3.6 05/29/2017 0631   CL 98 (L) 05/29/2017 0631   CO2 28 05/29/2017 0631   GLUCOSE 140 (H) 05/29/2017 0631   BUN 26 (H) 05/29/2017 0631   CREATININE 0.85 05/29/2017 0631   CALCIUM 8.0 (L) 05/29/2017 0631   PROT 5.8 (L) 05/29/2017 0242   ALBUMIN 2.8 (L) 05/29/2017 0242   AST 21 05/29/2017 0242   ALT 23 05/29/2017 0242   ALKPHOS 68 05/29/2017 0242   BILITOT 0.4 05/29/2017 0242   GFRNONAA >60 05/29/2017 0631   GFRAA >60 05/29/2017 0631   Lipase     Component Value Date/Time   LIPASE 31 05/14/2017 2106     Studies/Results: Dg Abd 1 View  Result Date: 05/29/2017 CLINICAL DATA:  66 year old female with nasogastric tube placement. Subsequent encounter. EXAM: ABDOMEN - 1 VIEW COMPARISON:  05/29/2017. FINDINGS: Nasogastric tube tip and side hole gastric body level. Gas distended small bowel loops with thickened folds consistent with small bowel obstruction once again noted. The possibility of free intraperitoneal air cannot be assessed on a supine view. IMPRESSION: Nasogastric tube tip and side hole gastric body level. Gas distended small bowel loops with thickened folds consistent with small bowel obstruction once again noted. Electronically Signed   By: Genia Del M.D.   On: 05/29/2017 07:06   Dg Abd Acute W/chest  Result Date: 05/29/2017 CLINICAL DATA:  History of cervical cancer, abdominal distention EXAM: DG ABDOMEN ACUTE W/ 1V CHEST COMPARISON:  05/17/2017, 05/15/2017 FINDINGS: Right-sided central venous port tip overlies the distal SVC. Small focus of atelectasis at the left base. No pleural effusion. Stable enlarged cardiomediastinal silhouette. Supine and upright views of the abdomen demonstrate multiple dilated loops of small bowel measuring up to 5.2 cm consistent with mechanical small bowel obstruction. No obvious free air on decubitus view. IMPRESSION: 1. No radiographic evidence for acute cardiopulmonary abnormality 2. Multiple loops of dilated small bowel, consistent with small bowel obstruction, this appears worsened radiographically compared with 05/17/2017. Electronically Signed   By: Madie Reno.D.  On: 05/29/2017 03:31    Anti-infectives: Anti-infectives    Start     Dose/Rate Route Frequency Ordered Stop   05/30/17 0600  ceFAZolin (ANCEF) IVPB 2g/100 mL premix     2 g 200 mL/hr over 30 Minutes Intravenous On call to O.R. 05/29/17 1430 05/31/17 0559      Assessment/Plan SBO - AM labs pending  - NGT in place - 1600 cc output  - can't do SBO protocol, patient is  allergic to contrast.  - OR today for exploration Hx of IIB endocervical adenocarcinoma s/p chemoradiation Hx of appendiceal mucinous adenocarcinoma s/p appendectomy HTN Hx of cerebral aneurysm repair Hx of seizures Type II DM - diet controlled   FEN - NPO, IVF. Continue NGT. VTE - SCDs, lovenox held for OR today ID - Cefazolin for surgical prophylaxis  Plan: To OR today for exploratory laparoscopy/laparotomy with Dr. [name]  LOS: 1 day    Brigid Re , [facility] Surgery 05/30/2017, 8:00 AM Pager: [phone] Consults: [phone] Mon-Fri 7:00 am-4:30 pm Sat-Sun 7:00 am-11:30 am

## 2017-05-30 NOTE — Transfer of Care (Signed)
Immediate Anesthesia Transfer of Care Note  Patient: Lori [name]  Procedure(s) Performed: Procedure(s): LAPAROSCOPY DIAGNOSTIC WITH PERITONEAL BIOPSY (N/A) EXPLORATORY LAPAROTOMY REPAIR WITH DRAINAGE OF INTRA ABDOMINAL ABSCESS OF SMALL BOWEL PERFORATION WITH ENTEROCOLOSTOMY  Patient Location: PACU  Anesthesia Type:General  Level of Consciousness: awake and alert   Airway & Oxygen Therapy: Patient Spontanous Breathing and Patient connected to face mask oxygen  Post-op Assessment: Report given to RN and Post -op Vital signs reviewed and stable  Post vital signs: Reviewed and stable  Last Vitals:  Vitals:   05/30/17 0517 05/30/17 1156  BP: (!) 141/94 (!) 153/97  Pulse: 98   Resp: 18 11  Temp: 36.8 C     Last Pain:  Vitals:   05/30/17 0745  TempSrc:   PainSc: 0-No pain         Complications: No apparent anesthesia complications

## 2017-05-30 NOTE — Anesthesia Postprocedure Evaluation (Signed)
Anesthesia Post Note  Patient: Lori [name]  Procedure(s) Performed: Procedure(s) (LRB): LAPAROSCOPY DIAGNOSTIC WITH PERITONEAL BIOPSY (N/A) EXPLORATORY LAPAROTOMY REPAIR WITH DRAINAGE OF INTRA ABDOMINAL ABSCESS OF SMALL BOWEL PERFORATION WITH ENTEROCOLOSTOMY     Patient location during evaluation: PACU Anesthesia Type: General Level of consciousness: awake and alert, oriented and patient cooperative Pain management: pain level controlled Vital Signs Assessment: post-procedure vital signs reviewed and stable Respiratory status: spontaneous breathing, nonlabored ventilation, respiratory function stable and patient connected to nasal cannula oxygen Cardiovascular status: blood pressure returned to baseline and stable Postop Assessment: no signs of nausea or vomiting Anesthetic complications: no    Last Vitals:  Vitals:   05/30/17 1645 05/30/17 1700  BP: 111/71 106/69  Pulse:    Resp: 18 12  Temp:      Last Pain:  Vitals:   05/30/17 1700  TempSrc:   PainSc: 9                  Miken Stecher,E. Johnta Couts

## 2017-05-30 NOTE — Anesthesia Procedure Notes (Signed)
Procedure Name: Intubation Date/Time: 05/30/2017 1:08 PM Performed by: Adelise Buswell, Virgel Gess Pre-anesthesia Checklist: Patient identified, Emergency Drugs available, Suction available, Patient being monitored and Timeout performed Patient Re-evaluated:Patient Re-evaluated prior to inductionOxygen Delivery Method: Circle system utilized Preoxygenation: Pre-oxygenation with 100% oxygen Intubation Type: IV induction Ventilation: Mask ventilation without difficulty Laryngoscope Size: Mac and 4 Grade View: Grade I Tube type: Oral Tube size: 7.5 mm Number of attempts: 1 Airway Equipment and Method: Stylet Placement Confirmation: ETT inserted through vocal cords under direct vision,  positive ETCO2,  CO2 detector and breath sounds checked- equal and bilateral Secured at: 21 cm Tube secured with: Tape Dental Injury: Teeth and Oropharynx as per pre-operative assessment

## 2017-05-30 NOTE — Significant Event (Addendum)
Rapid Response Event Note  Overview: Time Called: 2030 Arrival Time: 2036 Event Type: Hypotension  Initial Focused Assessment: General: lethargic and diaphoretic Neuro: Alert and oriented X 4, pupils equal and reactive Resp: Regular and even, no crackles or rhonchi CV: heart sound regular, S1S2 heard Skin: cold and clammy  V/S: HR 116, O2 Saturation 99% on 2L, RR 18, BP 84/59 (68). Temp 99.7 (rectal) Interventions: CBG = 138. Clonidine patch on the right upper arm removed. Order for 1L bolus received and administered.  Plan of Care (if not transferred): Patient not transferred, RN to continue monitoring. BP currently 89/72 (78), Patient alert and oriented Event Summary: Name of Physician Notified: Chaney Malling, NP at 2040    at    Outcome: Stayed in room and stabalized (RN asked to call if symptom reoccurs)    event ended at 2117  Lori [name], Lori [name]

## 2017-05-30 NOTE — Op Note (Signed)
Operative Note  Lori [name] female 66 y.o. 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 Intra-abdominal abscess secondary to contain small bowel perforation. Enterotomy.  PROCEDURE:   1. 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 2.  Exploratory laparotomy, drainage of abdominal abscess, repair of small bowel perforation, repair of small bowel enterotomy, enterocolostomy (mid ileum to mid transverse colon).         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 She's had 3 admissions for partial small bowel obstruction with thinned approximately the past 5 weeks. She is now brought to the operating room for the above procedure.    Procedure Detail:  She was brought to the operating room placed supine on the operating table and a general anesthetic was given. A Foley catheter was inserted. The abdominal walls widely sterilely prepped and draped. A timeout was performed.  She's placed in reverse Trendelenburg position. A small 5 mm incision was made in the left subcostal area. Using a 5 mm Optiview trocar and laparoscope, access was gained to the peritoneal cavity. A pneumoperitoneum was created. There is no evidence of organ injury or bleeding. Some dilated bowel was noted. Multiple peritoneal nodules were noted especially in the lower abdomen. There are also on the small bowel and small bowel mesentery. There also present on the sigmoid colon and the uterus. There is some slightly bloody and cloudy fluid in the pelvis. A 5 mm trocar was then placed in the right mid abdomen. A 5 mm  trochars placed in the left lower quadrant. I began trying to mobilize the colon and noticed a very firm mass with multiple large nodules present in the right lower quadrant area. I saw the point of obstruction of the distal ileum. This appeared to be a partial obstruction. I carefully examined the rest of the small bowel proximal to this point and saw no evidence of another point of obstruction. There were multiple tumor nodules on the small bowel and the small bowel mesentery. I performed a biopsy of some of the peritoneal nodules which came back metastatic adenocarcinoma.  I decided she would need a small bowel to large bowel bypass. Thus the laparoscopic procedure was stopped and midline incision was made beginning at the pelvis and extending above the umbilicus. Once entry into the abdominal cavity was then explored under direct vision. I freed up a little bit of the jejunum Adsitt was down in the right lower quadrant region and then noticed cloudy fluid and fibrinous debris in this area and a small little hole in the small intestine consistent with a contained perforation and abscess cavity. Sepsis cavity was drained and the perforation was closed in 2 layers with full-thickness 3-0 silk sutures followed by 3-0 silk Lembert type sutures. They continued to run the small bowel proximally and noticed a small enterotomy which was closed in 2 layers through this must of been done with laparoscopic instrument. This closed with interrupted full-thickness 3-0 silk sutures followed by 3-0 silk Lembert type sutures. The abdomen was irrigated.  The transverse colon was identified and a point in the mid ileum identified. The small intestine was very thickened and rubbery. I dissected the omentum free from the  mid transverse colon. I then milked stool through it. I approximated the mid ileum to the midtransverse colon. An enterotomy and colotomy were made. A side-to-side anastomosis was created with a linear cutting  stapler. I then closed the common defect in 2 layers. First layer was running full-thickness 3-0 Vicryl suture. Second layer was interrupted 3-0 silk Lembert sutures. A crotch stitch was placed. The anastomosis is widely patent,, viable, and under no tension, and placed back into the abdominal cavity. Both areas of the small bowel perforation and enterotomy were inspected and were not leaking.  The abdominal abdomen was then copiously irrigated with saline solution and solution and evacuated.  I inspected the abdominal cavity and there is no evidence of organ injury, intestinal leak, or bleeding.  The fascia was then closed with running #1 Novafil suture. The subcutaneous tissues packed with moistened gauze for by dry dressing. The trochars were removed and their skin incisions closed with staples. Sterile dressings were applied.  She tolerated the procedure well, was estimated, and taken to the recovery room in satisfactory condition. During the operation I did speak with her daughter-in-law about the findings and the plan.    Findings: There is diffuse carcinomatosis with a frozen pelvis. The tumor was heavily concentrated in the right lower quadrant and distal ileum leading to an obstruction. There are multiple peritoneal nodules present as well as multiple nodules on the small bowel in the small bowel mesentery, and sigmoid colon.]  Estimated Blood Loss:  200 mL               Specimens: Peritoneal nodules        Complications:  * No complications entered in OR log *         Disposition: PACU - hemodynamically stable.         Condition: stable

## 2017-05-30 NOTE — Progress Notes (Signed)
PROGRESS NOTE    Lori [name]  LDJ:[id number] DOB: 1951/01/01 DOA: 05/29/2017 PCP: [name], MD   Brief Narrative: 66 year old female with history of cervical cancer status post chemoradiation therapy, status post appendectomy was discharged about 10 days ago after admission for small bowel obstruction presented with increasing abdominal distention associated with nausea vomiting and not feeling well. Patient reported that she did not really feel better after recent hospital discharge. She was trying to advance diet but did not tolerate. In the ER found to have small bowel obstruction. General surgery was consulted, NG tube was placed Nothing by mouth and admitted for further evaluation.  Assessment & Plan:   # Small bowel obstruction: This is a recurrent problem. Patient reported abdominal pain and has no bowel movement. Currently has NG tube. Plan for or today for exploratory laparotomy by general surgery. The consult appreciated. I will change IV fluid with potassium and dextrose since patient is nothing by mouth for long time now. Monitor blood sugar level. Continue Protonix IV.  #History of seizure disorder: Patient reported that the last seizure was about 20 years ago and she is on Tegretol for same duration. Currently taking Lotrel is off because of nothing by mouth status. I discussed this with neurologist on call Dr. Cristobal Goldmann, who recommended to start IV Keppra 500 twice a day until patient can take orally. Continue seizure precaution.  #History of type 2 diabetes: Diet control not on medication. Continue to monitor.  #History of appendectomy, endocervical adenocarcinoma status post chemoradiation: Recorded outpatient follow-up.  #History of hypertension: Continue IV metoprolol for now. Monitor blood pressure.  #Hyponatremia improved with IV fluid. Monitor BMP.  DVT prophylaxis: Anticoagulation on hold because patient is going to or today Code Status: Full code Family  Communication: Patient's multiple family members at bedside Disposition Plan: Unknown this time likely discharge to home versus rehabilitation in 2-3 days    Consultants:   General surgery  Procedures: None Antimicrobials: IV cefazolin before or today  Subjective: Seen and examined at bedside. Patient denied nausea vomiting but has abdominal pain. Hasn't passed gas or bowel movement. No chest pain or shortness of breath. He understand about surgery today.  Objective: Vitals:   05/29/17 1409 05/29/17 1500 05/29/17 2309 05/30/17 0517  BP: 110/70 110/78 (!) 145/94 (!) 141/94  Pulse:  80 (!) 106 98  Resp:  18 18 18   Temp:  97.7 F (36.5 C) 99 F (37.2 C) 98.3 F (36.8 C)  TempSrc:  Oral Oral Oral  SpO2:  100% 99% 98%  Weight:      Height:        Intake/Output Summary (Last 24 hours) at 05/30/17 1113 Last data filed at 05/30/17 0649  Gross per 24 hour  Intake          1591.25 ml  Output             1600 ml  Net            -8.75 ml   Filed Weights   05/29/17 0001 05/29/17 0531  Weight: 95.3 kg (210 lb) 94.1 kg (207 lb 7.3 oz)    Examination:  General exam: Appears calm and comfortable  Respiratory system: Clear to auscultation. Respiratory effort normal. No wheezing or crackle Cardiovascular system: S1 & S2 heard, RRR.  No pedal edema. Gastrointestinal system: Abdomen is distended and firm, sluggish bowel sound Central nervous system: Alert and oriented. No focal neurological deficits. Extremities: Symmetric 5 x 5 power. Skin: No rashes, lesions or  ulcers Psychiatry: Judgement and insight appear normal. Mood & affect appropriate.     Data Reviewed: I have personally reviewed following labs and imaging studies  CBC:  Recent Labs Lab 05/29/17 0242 05/29/17 0631 05/30/17 0644  WBC 8.8 9.1 9.1  NEUTROABS 6.4  --   --   HGB 11.3* 10.7* 10.7*  HCT 33.7* 32.0* 32.5*  MCV 86.2 88.4 87.4  PLT 385 322 619   Basic Metabolic Panel:  Recent Labs Lab  05/29/17 0242 05/29/17 0631 05/30/17 0644  NA 129* 134* 136  K 3.6 3.6 4.1  CL 94* 98* 102  CO2 25 28 24   GLUCOSE 182* 140* 96  BUN 26* 26* 21*  CREATININE 0.86 0.85 0.88  CALCIUM 8.3* 8.0* 8.0*  MG  --   --  1.8   GFR: Estimated Creatinine Clearance: 75.4 mL/min (by C-G formula based on SCr of 0.88 mg/dL). Liver Function Tests:  Recent Labs Lab 05/29/17 0242  AST 21  ALT 23  ALKPHOS 68  BILITOT 0.4  PROT 5.8*  ALBUMIN 2.8*   No results for input(s): LIPASE, AMYLASE in the last 168 hours. No results for input(s): AMMONIA in the last 168 hours. Coagulation Profile: No results for input(s): INR, PROTIME in the last 168 hours. Cardiac Enzymes: No results for input(s): CKTOTAL, CKMB, CKMBINDEX, TROPONINI in the last 168 hours. BNP (last 3 results) No results for input(s): PROBNP in the last 8760 hours. HbA1C: No results for input(s): HGBA1C in the last 72 hours. CBG:  Recent Labs Lab 05/29/17 0801 05/29/17 1636 05/30/17 0114 05/30/17 0817  GLUCAP 136* 121* 101* 97   Lipid Profile: No results for input(s): CHOL, HDL, LDLCALC, TRIG, CHOLHDL, LDLDIRECT in the last 72 hours. Thyroid Function Tests: No results for input(s): TSH, T4TOTAL, FREET4, T3FREE, THYROIDAB in the last 72 hours. Anemia Panel: No results for input(s): VITAMINB12, FOLATE, FERRITIN, TIBC, IRON, RETICCTPCT in the last 72 hours. Sepsis Labs:  Recent Labs Lab 05/29/17 0254  LATICACIDVEN 1.19    No results found for this or any previous visit (from the past 240 hour(s)).       Radiology Studies: Dg Abd 1 View  Result Date: 05/29/2017 CLINICAL DATA:  66 year old female with nasogastric tube placement. Subsequent encounter. EXAM: ABDOMEN - 1 VIEW COMPARISON:  05/29/2017. FINDINGS: Nasogastric tube tip and side hole gastric body level. Gas distended small bowel loops with thickened folds consistent with small bowel obstruction once again noted. The possibility of free intraperitoneal air  cannot be assessed on a supine view. IMPRESSION: Nasogastric tube tip and side hole gastric body level. Gas distended small bowel loops with thickened folds consistent with small bowel obstruction once again noted. Electronically Signed   By: Genia Del M.D.   On: 05/29/2017 07:06   Dg Abd Acute W/chest  Result Date: 05/29/2017 CLINICAL DATA:  History of cervical cancer, abdominal distention EXAM: DG ABDOMEN ACUTE W/ 1V CHEST COMPARISON:  05/17/2017, 05/15/2017 FINDINGS: Right-sided central venous port tip overlies the distal SVC. Small focus of atelectasis at the left base. No pleural effusion. Stable enlarged cardiomediastinal silhouette. Supine and upright views of the abdomen demonstrate multiple dilated loops of small bowel measuring up to 5.2 cm consistent with mechanical small bowel obstruction. No obvious free air on decubitus view. IMPRESSION: 1. No radiographic evidence for acute cardiopulmonary abnormality 2. Multiple loops of dilated small bowel, consistent with small bowel obstruction, this appears worsened radiographically compared with 05/17/2017. Electronically Signed   By: Donavan Foil M.D.   On: 05/29/2017  03:31        Scheduled Meds: . cloNIDine  0.2 mg Transdermal Weekly  . metoprolol tartrate  2.5 mg Intravenous Q8H  . pantoprazole (PROTONIX) IV  40 mg Intravenous Q24H   Continuous Infusions: .  ceFAZolin (ANCEF) IV    . dextrose 5 % and 0.9 % NaCl with KCl 20 mEq/L    . levETIRAcetam       LOS: 1 day    Annarae Macnair Tanna Furry, MD [facility] Pager [phone]  If 7PM-7AM, please contact night-coverage [email or web address] Password [email or web address] 05/30/2017, 11:13 AM

## 2017-05-31 ENCOUNTER — Encounter (HOSPITAL_COMMUNITY): Payer: Self-pay | Admitting: General Surgery

## 2017-05-31 ENCOUNTER — Inpatient Hospital Stay (HOSPITAL_COMMUNITY): Payer: Medicare Other

## 2017-05-31 DIAGNOSIS — T8112XS Postprocedural septic shock, sequela: Secondary | ICD-10-CM

## 2017-05-31 DIAGNOSIS — R6521 Severe sepsis with septic shock: Secondary | ICD-10-CM

## 2017-05-31 DIAGNOSIS — C539 Malignant neoplasm of cervix uteri, unspecified: Secondary | ICD-10-CM

## 2017-05-31 DIAGNOSIS — C801 Malignant (primary) neoplasm, unspecified: Secondary | ICD-10-CM

## 2017-05-31 DIAGNOSIS — C786 Secondary malignant neoplasm of retroperitoneum and peritoneum: Secondary | ICD-10-CM

## 2017-05-31 DIAGNOSIS — K56609 Unspecified intestinal obstruction, unspecified as to partial versus complete obstruction: Secondary | ICD-10-CM

## 2017-05-31 DIAGNOSIS — A419 Sepsis, unspecified organism: Principal | ICD-10-CM

## 2017-05-31 LAB — GLUCOSE, CAPILLARY
GLUCOSE-CAPILLARY: 147 mg/dL — AB (ref 65–99)
GLUCOSE-CAPILLARY: 196 mg/dL — AB (ref 65–99)
GLUCOSE-CAPILLARY: 277 mg/dL — AB (ref 65–99)
Glucose-Capillary: 239 mg/dL — ABNORMAL HIGH (ref 65–99)
Glucose-Capillary: 186 mg/dL — ABNORMAL HIGH (ref 65–99)

## 2017-05-31 LAB — CBC
HCT: 38.6 % (ref 36.0–46.0)
Hemoglobin: 12.4 g/dL (ref 12.0–15.0)
MCH: 28.6 pg (ref 26.0–34.0)
MCHC: 32.1 g/dL (ref 30.0–36.0)
MCV: 89.1 fL (ref 78.0–100.0)
PLATELETS: 334 10*3/uL (ref 150–400)
RBC: 4.33 MIL/uL (ref 3.87–5.11)
RDW: 14.2 % (ref 11.5–15.5)
WBC: 8.5 10*3/uL (ref 4.0–10.5)

## 2017-05-31 LAB — LACTIC ACID, PLASMA
LACTIC ACID, VENOUS: 2.6 mmol/L — AB (ref 0.5–1.9)
LACTIC ACID, VENOUS: 2.9 mmol/L — AB (ref 0.5–1.9)
LACTIC ACID, VENOUS: 3.4 mmol/L — AB (ref 0.5–1.9)

## 2017-05-31 LAB — BASIC METABOLIC PANEL
Anion gap: 10 (ref 5–15)
BUN: 26 mg/dL — AB (ref 6–20)
CALCIUM: 6.8 mg/dL — AB (ref 8.9–10.3)
CHLORIDE: 110 mmol/L (ref 101–111)
CO2: 17 mmol/L — ABNORMAL LOW (ref 22–32)
CREATININE: 1.54 mg/dL — AB (ref 0.44–1.00)
GFR, EST AFRICAN AMERICAN: 39 mL/min — AB (ref >60)
GFR, EST NON AFRICAN AMERICAN: 34 mL/min — AB (ref >60)
Glucose, Bld: 249 mg/dL — ABNORMAL HIGH (ref 65–99)
Potassium: 4.5 mmol/L (ref 3.5–5.1)
SODIUM: 137 mmol/L (ref 135–145)

## 2017-05-31 LAB — PREALBUMIN: PREALBUMIN: 8.3 mg/dL — AB (ref 18–38)

## 2017-05-31 MED ORDER — SODIUM CHLORIDE 0.9 % IV BOLUS (SEPSIS)
500.0000 mL | Freq: Once | INTRAVENOUS | Status: AC
  Administered 2017-05-31: 500 mL via INTRAVENOUS

## 2017-05-31 MED ORDER — ORAL CARE MOUTH RINSE
15.0000 mL | Freq: Two times a day (BID) | OROMUCOSAL | Status: DC
  Administered 2017-06-01 – 2017-06-03 (×5): 15 mL via OROMUCOSAL

## 2017-05-31 MED ORDER — CHLORHEXIDINE GLUCONATE 0.12 % MT SOLN
15.0000 mL | Freq: Two times a day (BID) | OROMUCOSAL | Status: DC
Start: 2017-05-31 — End: 2017-06-08
  Administered 2017-06-02 – 2017-06-08 (×8): 15 mL via OROMUCOSAL
  Filled 2017-05-31 (×10): qty 15

## 2017-05-31 MED ORDER — VANCOMYCIN HCL IN DEXTROSE 750-5 MG/150ML-% IV SOLN
750.0000 mg | Freq: Two times a day (BID) | INTRAVENOUS | Status: DC
  Administered 2017-05-31: 750 mg via INTRAVENOUS
  Filled 2017-05-31 (×2): qty 150

## 2017-05-31 MED ORDER — SODIUM CHLORIDE 0.9 % IV BOLUS (SEPSIS)
1000.0000 mL | Freq: Once | INTRAVENOUS | Status: AC
  Administered 2017-05-31: 1000 mL via INTRAVENOUS

## 2017-05-31 MED ORDER — ORAL CARE MOUTH RINSE
15.0000 mL | Freq: Two times a day (BID) | OROMUCOSAL | Status: DC
  Administered 2017-06-03 – 2017-06-08 (×8): 15 mL via OROMUCOSAL

## 2017-05-31 MED ORDER — CHLORHEXIDINE GLUCONATE CLOTH 2 % EX PADS
6.0000 | MEDICATED_PAD | Freq: Every day | CUTANEOUS | Status: DC
  Administered 2017-05-31 – 2017-06-08 (×7): 6 via TOPICAL

## 2017-05-31 MED ORDER — NOREPINEPHRINE BITARTRATE 1 MG/ML IV SOLN
0.0000 ug/min | INTRAVENOUS | Status: DC
  Administered 2017-05-31: 5 ug/min via INTRAVENOUS
  Administered 2017-05-31 – 2017-06-01 (×2): 10 ug/min via INTRAVENOUS
  Administered 2017-06-01: 15 ug/min via INTRAVENOUS
  Administered 2017-06-01: 5 ug/min via INTRAVENOUS
  Filled 2017-05-31 (×5): qty 4

## 2017-05-31 MED ORDER — SODIUM CHLORIDE 0.9 % IV BOLUS (SEPSIS): 500.0000 mL | Freq: Once | INTRAVENOUS | Status: DC

## 2017-05-31 MED ORDER — CHLORHEXIDINE GLUCONATE 0.12 % MT SOLN
15.0000 mL | Freq: Two times a day (BID) | OROMUCOSAL | Status: DC
  Administered 2017-05-31 – 2017-06-05 (×8): 15 mL via OROMUCOSAL
  Filled 2017-05-31 (×3): qty 15

## 2017-05-31 MED ORDER — MORPHINE SULFATE (PF) 2 MG/ML IV SOLN
2.0000 mg | INTRAVENOUS | Status: DC | PRN
  Administered 2017-05-31 – 2017-06-01 (×4): 2 mg via INTRAVENOUS
  Filled 2017-05-31 (×4): qty 1

## 2017-05-31 MED ORDER — VANCOMYCIN HCL IN DEXTROSE 750-5 MG/150ML-% IV SOLN
750.0000 mg | INTRAVENOUS | Status: AC
  Administered 2017-05-31: 750 mg via INTRAVENOUS
  Filled 2017-05-31: qty 150

## 2017-05-31 MED ORDER — MORPHINE SULFATE (PF) 4 MG/ML IV SOLN: 2.0000 mg | INTRAVENOUS | Status: DC | PRN

## 2017-05-31 MED ORDER — DEXTROSE-NACL 5-0.45 % IV SOLN
INTRAVENOUS | Status: DC
  Administered 2017-05-31: 125 mL/h via INTRAVENOUS
  Administered 2017-06-01 – 2017-06-02 (×2): via INTRAVENOUS

## 2017-05-31 MED ORDER — INSULIN ASPART 100 UNIT/ML ~~LOC~~ SOLN
0.0000 [IU] | SUBCUTANEOUS | Status: DC
  Administered 2017-05-31: 3 [IU] via SUBCUTANEOUS
  Administered 2017-05-31: 5 [IU] via SUBCUTANEOUS
  Administered 2017-05-31: 3 [IU] via SUBCUTANEOUS
  Administered 2017-06-01: 8 [IU] via SUBCUTANEOUS
  Administered 2017-06-01 (×2): 5 [IU] via SUBCUTANEOUS

## 2017-05-31 NOTE — Progress Notes (Addendum)
Pharmacy Antibiotic Note  Lori [name] is a 66 y.o. 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  She was recently hospitalized for SBO and discharged on 05/19/17.  She presented to the ED on 05/29/2017 with c/o abd pain with SBO noted on abd CT.  Patient undersent exp lap on 6/27 --   peritoneal nodule bx with drainage of abdominal abscess, repair of small bowel perforation, repair of small bowel enterotomy, and enterocolostomy performed.  Zosyn started on 05/30/17 for intra-abdominal infection.  To add vancomycin to abx regimen for suspected sepsis.  Plan: - continue zosyn 3.375 gm IV q8h (infuse over 4 hrs) - vancomycin 750 mg IV q12h - daily scr  Adden (1126): with renal insuff, will give an additional vancomycin 750 mg IV x1 now (to get 1500 mg total).  Will check random level on 6/29 and redose if level is < 20. F/u with renal function in AM  ___________________________  Height: 5\' 8"  (172.7 cm) Weight: 207 lb 7.3 oz (94.1 kg) IBW/kg (Calculated) : 63.9  Temp (24hrs), Avg:97.8 F (36.6 C), Min:93 F (33.9 C), Max:99.7 F (37.6 C)   Recent Labs Lab 05/29/17 0242 05/29/17 0254 05/29/17 0631 05/30/17 0644 05/31/17 0402 05/31/17 0520  WBC 8.8  --  9.1 9.1 8.5  --   CREATININE 0.86  --  0.85 0.88 1.54*  --   LATICACIDVEN  --  1.19  --   --   --  2.6*    Estimated Creatinine Clearance: 43.1 mL/min (A) (by C-G formula based on SCr of 1.54 mg/dL (H)).    Allergies  Allergen Reactions  . Contrast Media [Iodinated Diagnostic Agents] Itching and Swelling  . Dilantin [Phenytoin Sodium Extended] Itching, Swelling and Other (See Comments)    Whole body  . Latex Hives and Itching  . Adhesive [Tape] Rash     Thank you for allowing pharmacy to be a part of this patient's care.  Lynelle Doctor 05/31/2017 8:57 AM

## 2017-05-31 NOTE — Consult Note (Addendum)
PULMONARY / CRITICAL CARE MEDICINE   Name: Lori [name] MRN: [id number] DOB: 03-Apr-1951    ADMISSION DATE:  05/29/2017 CONSULTATION DATE:  05/31/2017  REFERRING MD:  Carolin Sicks  CHIEF COMPLAINT:  Hypotension  HISTORY OF PRESENT ILLNESS:   66 y/o female with a past medical history of cervical cancer s/p chemoradiation therapy who presented with recurrent small bowel obstruction on 6/26 to our hospital.  General surgery was consulted and she went to the OR on 6/27 where she was noted to have nodes positive for metastatic adenocarcinoma, small bowel obstruction due to bowel nodues and a firm mass in the RLQ, and an abscess in the right lower quadrant. Her abdomen was closed at the completion of the procedure and she went to the floor.  Overnight she has had persistent hypotension and hypothermia so she was moved to the ICU.  She has been administered 4L NS overnight as well.  She received a dose of zosyn yesterday.    She denies dysuria or cough.  She has some R shoulder pain.    She says that she last had a bowel movement about 10 days ago.   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); Cervical cancer, FIGO stage IIB (HCC) (05/24/2015); Cervical carcinoma [facility]) (oncologist--  dr Sondra Come for radiation/   [facility] for chemotherapy); Depression; Endometrial carcinoma ([place]); GERD (gastroesophageal reflux disease); Heart murmur; History of blood transfusion; History of cerebral aneurysm repair; History of radiation therapy (07/28/15-08/26/15); History of seizures; Hyperlipidemia; Hypertension; PMB (postmenopausal bleeding); Pulmonary nodule (03/26/2017); Seizures ([place]); and Type 2 diabetes, diet controlled ([place]).  PAST SURGICAL HISTORY: She  has a past surgical history that includes Craniotomy posterior fossa (1997); PORT-A-CATH PLACEMENT (06/  2016); Tandem ring insertion (N/A, 07/28/2015); Tandem ring  insertion (N/A, 08/05/2015); Tandem ring insertion (N/A, 08/11/2015); Tandem ring insertion (N/A, 08/17/2015); Tandem ring insertion (N/A, 08/26/2015); Colonoscopy; laparoscopic appendectomy (N/A, 04/19/2016); laparoscopy (N/A, 05/30/2017); and laparotomy (05/30/2017).  Allergies  Allergen Reactions  . Contrast Media [Iodinated Diagnostic Agents] Itching and Swelling  . Dilantin [Phenytoin Sodium Extended] Itching, Swelling and Other (See Comments)    Whole body  . Latex Hives and Itching  . Adhesive [Tape] Rash    Current Facility-Administered Medications on File Prior to Encounter  Medication  . sodium chloride flush (NS) 0.9 % injection 10 mL   Current Outpatient Prescriptions on File Prior to Encounter  Medication Sig  . amLODipine (NORVASC) 10 MG tablet Take 10 mg by mouth every morning.   Marland Kitchen aspirin EC 81 MG tablet Take 81 mg by mouth daily.  . benazepril (LOTENSIN) 40 MG tablet Take 40 mg by mouth every morning.   . carbamazepine (TEGRETOL) 100 MG chewable tablet Chew 100 mg by mouth 3 (three) times daily.   . cholecalciferol (VITAMIN D) 1000 units tablet Take 2,000 Units by mouth daily.   . cloNIDine (CATAPRES) 0.2 MG tablet Take 0.2 mg by mouth 3 (three) times daily.   Marland Kitchen gabapentin (NEURONTIN) 300 MG capsule Take 300 mg by mouth 2 (two) times daily.   . hydrochlorothiazide (HYDRODIURIL) 25 MG tablet Take 25 mg by mouth every morning.   . loperamide (IMODIUM A-D) 2 MG tablet Take 2 mg by mouth 4 (four) times daily as needed for diarrhea or loose stools. Reported on 03/28/2016  . magnesium oxide (MAG-OX) 400 (241.3 Mg) MG tablet Take 400 mg by mouth daily.   . metoprolol (LOPRESSOR) 50 MG  tablet Take 25 mg by mouth 2 (two) times daily.   . potassium chloride SA (K-DUR,KLOR-CON) 20 MEQ tablet Take 20 mEq by mouth 2 (two) times daily.   . pravastatin (PRAVACHOL) 20 MG tablet Take 20 mg by mouth every evening.     FAMILY HISTORY:  Her indicated that her mother is deceased. She indicated  that her father is deceased. She indicated that all of her three sisters are alive. She indicated that both of her brothers are alive.    SOCIAL HISTORY: She  reports that she quit smoking about 21 years ago. Her smoking use included Cigarettes. She has a 1.25 pack-year smoking history. She has never used smokeless tobacco. She reports that she does not drink alcohol or use drugs.  REVIEW OF SYSTEMS:   Gen: Denies fever, + chills, weight change, + fatigue, night sweats HEENT: Denies blurred vision, double vision, hearing loss, tinnitus, sinus congestion, rhinorrhea, sore throat, neck stiffness, dysphagia PULM: Denies shortness of breath, cough, sputum production, hemoptysis, wheezing CV: Denies chest pain, edema, orthopnea, paroxysmal nocturnal dyspnea, palpitations GI notes some abdominal incision pain GU: Denies dysuria, hematuria, polyuria, oliguria, urethral discharge Endocrine: Denies hot or cold intolerance, polyuria, polyphagia or appetite change Derm: Denies rash, dry skin, scaling or peeling skin change Heme: Denies easy bruising, bleeding, bleeding gums Neuro: Denies headache, numbness, weakness, slurred speech, loss of memory or consciousness   SUBJECTIVE:  As above  VITAL SIGNS: BP (!) 82/56 (BP Location: Left Arm)   Pulse (!) 116   Temp (!) 93 F (33.9 C) (Oral)   Resp 20   Ht 5\' 8"  (1.727 m)   Wt 207 lb 7.3 oz (94.1 kg)   SpO2 99%   BMI 31.54 kg/m   HEMODYNAMICS:    VENTILATOR SETTINGS:    INTAKE / OUTPUT: I/O last 3 completed shifts: In: 6332.9 [I.V.:5687.9; NG/GT:90; IV Piggyback:555] Out: 2555 [Urine:530; Emesis/NG output:1825; Blood:200]  PHYSICAL EXAMINATION: General:  Weak, lying in bed Neuro:  Awake, alert, no distress HEENT:  NCAT OP clear Cardiovascular:  RRR, no mgr Lungs:  CTA B, normal effort Abdomen:  Mildly tender, midline wound well dressed with dressing c/d/i Musculoskeletal:  Normal bulk and tone Skin:  No rash or skin  breakdown  LABS:  BMET  Recent Labs Lab 05/29/17 0631 05/30/17 0644 05/31/17 0402  NA 134* 136 137  K 3.6 4.1 4.5  CL 98* 102 110  CO2 28 24 17*  BUN 26* 21* 26*  CREATININE 0.85 0.88 1.54*  GLUCOSE 140* 96 249*    Electrolytes  Recent Labs Lab 05/29/17 0631 05/30/17 0644 05/31/17 0402  CALCIUM 8.0* 8.0* 6.8*  MG  --  1.8  --     CBC  Recent Labs Lab 05/29/17 0631 05/30/17 0644 05/31/17 0402  WBC 9.1 9.1 8.5  HGB 10.7* 10.7* 12.4  HCT 32.0* 32.5* 38.6  PLT 322 364 334    Coag's No results for input(s): APTT, INR in the last 168 hours.  Sepsis Markers  Recent Labs Lab 05/29/17 0254 05/31/17 0520  LATICACIDVEN 1.19 2.6*    ABG No results for input(s): PHART, PCO2ART, PO2ART in the last 168 hours.  Liver Enzymes  Recent Labs Lab 05/29/17 0242  AST 21  ALT 23  ALKPHOS 68  BILITOT 0.4  ALBUMIN 2.8*    Cardiac Enzymes No results for input(s): TROPONINI, PROBNP in the last 168 hours.  Glucose  Recent Labs Lab 05/30/17 0114 05/30/17 6979 05/30/17 1553 05/30/17 2042 05/30/17 2044 05/31/17 0012  GLUCAP 101* 97 126* 116* 138* 147*    Imaging No results found.   STUDIES:  6/12 CT ab/pelv > small bowel obstruction, stomach markedly enlarged, increased dilation of the small bowel loops, no free air, cardiomegally  CULTURES: 6/28 blood >  6.28 urine >   ANTIBIOTICS: 6/28 vanc >  6/28 zosyn >   SIGNIFICANT EVENTS: 6/26 admission 6/27 ex-lap 6/28 move to ICU  LINES/TUBES:   DISCUSSION: 66 y/o female with a complex oncologic history with cervical adenocarcinoma as well as apendiceal adenocarcinoma who presented to our facility with a recurrent small bowel obstruction due to malignancy.  Noted to have an abscess on ex-lap 6/27.  PCCM consulted for septic shock on 6/28.    ASSESSMENT / PLAN:  INFECTIOUS A:   Septic shock, suspect intra-abdominal source P:   Start Vanc Re-order Zosyn (appears to have missed a dose last  night) Blood cultures, urine cultures now CXR to look for pneumonia (doubt)  CARDIOVASCULAR A:  Septic shock > repeat assessment post 4L normal saline she appears volume replete P:  Add levophed for MAP goal > 65 Tele BP monitoring Continue IVF Repeat lactic acid (ordered for 8 AM, will ensure re-collected)  PULMONARY A: Pulmonary nodules P:   Monitor O2 saturation F/U with Onc/tumor board who is managing the nodules  RENAL A:   AKI P: Monitor BMET and UOP Replace electrolytes as needed  GASTROINTESTINAL A:   SBO, s/p ex-lap (abscess drainage, repair of small bowel perforation, repair of small bowel enterotomy) P:   NPO Maintain NG tube Wound care per surgery  HEMATOLOGIC A:   Anemia without bleeding Multiple forms of cancer P:  Monitor for bleeding Transfuse if Hgb < 6gm/dL Consider Oncology consult  ENDOCRINE A:   Hyperglycemia P:   SSI  NEUROLOGIC A:   Abdominal pain post procedure Seizure disorder P:   Prn morphine Keppra   FAMILY  - Updates: none bedside  - Inter-disciplinary family meet or Palliative Care meeting due by: day 7  My cc time 35 minutes  Roselie Awkward, MD [place] [facility] Pager: [phone] Cell: [phone] After 3pm or if no response, call [phone]    Sepsis - Repeat Assessment  Performed at:    0940  Vitals     Blood pressure (!) 82/56, pulse (!) 116, temperature (!) 93 F (33.9 C), temperature source Oral, resp. rate 20, height 5\' 8"  (1.727 m), weight 207 lb 7.3 oz (94.1 kg), SpO2 99 %.  Heart:     Tachycardic  Lungs:    CTA  Capillary Refill:   <2 sec  Peripheral Pulse:   Radial pulse palpable  Skin:     Normal Color     05/31/2017, 8:44 AM

## 2017-05-31 NOTE — Progress Notes (Signed)
Shift event note: Notified initially just after returning from surgery regarding persistent hypotension. Seen at bedside by RR RN and noted to A A & O x 3 w/ MAP of 64, HR-116 and T-99.7 w/ 02 sats of 99% on 2L [place]. BP initially responded to 1.5 L IVFB. Clonidine patch removed and scheduled IV Metoprolol d/c'd for now. Re-paged this am for return of persistent hypotension and slight lethargy. RR RN was paged to bedside and ordered serum lactate. Order placed for pt to get Narcan 0.4 mg IV and repeat IVFB. At bedside pt now quite awake and talkative since Narcan. She is able to answer questions appropriately. Skin w/d. BP w/ immediate improvement from 89/59 to 100/71. Temp remains 99.7 and remaining VS unchanged. Abd noted w/ post-op dressing CDI. Abd remains somewhat distended but soft and tender to palpation. BS noted to LLQ.  Assessment/Plan: 1. Persistent hypotension: In post-op setting. Initially felt related to s/p anesthesia, initially responding to IVF's. Considering immediate improvement in BP w/ Narcan will d/c Morphine PCA for now and give small doses of IV Morphine q3h until BP more stable. Follow lactate. Repeat IVFB's as indicated. Will continue to monitor closely on telemetry.  Jeryl Columbia, NP-C [facility] Pager [phone]

## 2017-05-31 NOTE — Significant Event (Signed)
Rapid Response Event Note  Overview: Time Called: 0501 Arrival Time: 0510 Event Type: Hypotension  Initial Focused Assessment: Called a second time to patient's room for low blood pressures, on arrival patient was in bed with eyes closed. Patient looked toxic, she was drowsy but alert and oriented x 4. She was cold and clammy to touch. Respirations was regular and even.SBP in 70s, with MAP of 40.   Interventions: Morning labs reviewed. EKG completed, Lactate was drawn. Order for 0.4mg  Narcan received and administered. PCA pump turned off. Order for 524ml Normal Saline bolus was received and administered. Plan of Care (if not transferred): Patient responsive to interventions. BP 100/71 (81). Event Summary: Name of Physician Notified: Chaney Malling, NP at [place]    at    Outcome: Stayed in room and stabalized  Event End Time: 9355  Richrd Humbles

## 2017-05-31 NOTE — Progress Notes (Signed)
CRITICAL VALUE ALERT  Critical Value: lactic acid  2.9 Date & Time Notied: 6/28   1215pm  Provider Notified: Dr Lake Bells  Orders Received/Actions taken:will adm another 1 liter NS bolus and repeat LA in 3 hrs

## 2017-05-31 NOTE — Progress Notes (Signed)
[place] Progress Note Patient Name: Lori [name] DOB: 12-01-1951 MRN: [id number]   Date of Service  05/31/2017  HPI/Events of Note  Patient with septic shock. S/P IVF resuscitation. HR now sustaining in 130s. Appears sinus on telemetry & comparable to prior EKG. Pain rated at 5/10 by RN. Camera check shows patient resting comfortably. Reported to be on 4 L/m. Breathing comfortably. [place] requirement has increased slightly this afternoon.   eICU Interventions  1. NS 500cc over 1 hour 2. Checking stat serum cortisol 3. Continuing vasopressor support     Intervention Category Major Interventions: Shock - evaluation and management;Sepsis - evaluation and management  Tera Partridge 05/31/2017, 9:22 PM

## 2017-05-31 NOTE — Progress Notes (Addendum)
Pt transferred to ICU/SD room 1230. Bedside report given to Jacklynn Lewis, RN.

## 2017-05-31 NOTE — Progress Notes (Addendum)
PROGRESS NOTE    Lori [name]  SWF:[id number] DOB: 1951/10/07 DOA: 05/29/2017 PCP: [name], MD   Brief Narrative: 66 year old female with history of cervical cancer status post chemoradiation therapy, status post appendectomy was discharged about 10 days ago after admission for small bowel obstruction presented with increasing abdominal distention associated with nausea vomiting and not feeling well. Patient reported that she did not really feel better after recent hospital discharge. She was trying to advance diet but did not tolerate. In the ER found to have small bowel obstruction. General surgery was consulted, NG tube was placed Nothing by mouth and admitted for further evaluation.  Patient underwent ex lap on 6/27, found to be hypotensive s/p IVF boluses with no improvement. Patient was transferred to ICU on 05/31/2017. PCCM consult requested.  Assessment & Plan:  # Shock, likely septic shock: -Patient with cold extremities and persistent hypotension not responding with IV fluid boluses. Mental status was acceptable. Patient was immediately evaluated and transferred to ICU. Started IV Levophed for Shock to maintain map more than 65. Discontinued clonidine, metoprolol. Critical care consult requested and discussed with Dr.Mcquaid. -monitor blood pressure, heart rate and labs closely.  # Small bowel obstruction: Status post exploratory laparoscopy, finding consistent with malignancy. General surgery consult is following for wound care. Oncology consult. Currently nothing by mouth. Continue IV fluid, Protonix IV.  # Acute kidney injury: in the setting of shock. Check UA. Monitor BMP. I/o. Monitor BMP, avoid nephrotoxins.  #History of seizure disorder: Patient reported that the last seizure was about 20 years ago and she is on Tegretol for same duration. Currently tegretol is off because of nothing by mouth status. I discussed this with neurologist on call Dr. Cristobal Goldmann on 6/27, who  recommended to start IV Keppra 500 twice a day until patient can take orally. Continue seizure precaution.  #History of type 2 diabetes: Diet control not on medication. Continue to monitor.  #History of appendectomy, endocervical adenocarcinoma status post chemoradiation: oncology eval and follow ups/  #History of hypertension: Currently hypotensive.  #Hyponatremia improved with IV fluid. Monitor BMP.  DVT prophylaxis: Anticoagulation on hold because of surgery ex-lap. checmical ppx as per surgery Code Status: Full code Family Communication: No family at bedside. Disposition Plan: Currently in ICU.    Consultants:   General surgery  pccm  oncology  Procedures: None Antimicrobials:  Iv vanco and zosyn since 6/28  Subjective: Seen and examined at bedside. Patient with hypertension and not feeling well. Having pain. Denied headache, dizziness, chest pain or shortness of breath.  Objective: Vitals:   05/31/17 0552 05/31/17 0649 05/31/17 0715 05/31/17 0840  BP: 100/71 (!) 87/60 (!) 82/56   Pulse: (!) 118 (!) 116 (!) 116 (!) 118  Resp:  20  (!) 22  Temp:  (!) 93 F (33.9 C)  97.7 F (36.5 C)  TempSrc:  Oral  Oral  SpO2:    98%  Weight:    100.4 kg (221 lb 5.5 oz)  Height:    5\' 7"  (1.702 m)    Intake/Output Summary (Last 24 hours) at 05/31/17 1208 Last data filed at 05/31/17 1100  Gross per 24 hour  Intake          5735.62 ml  Output              955 ml  Net          4780.62 ml   Filed Weights   05/29/17 0001 05/29/17 0531 05/31/17 0840  Weight: 95.3 kg (210  lb) 94.1 kg (207 lb 7.3 oz) 100.4 kg (221 lb 5.5 oz)    Examination:  General exam: Ill-looking female, lying on bed Respiratory system: Clear bilaterally, respiratory effort normal Cardiovascular system: Regular tachycardic, especially normal. No pedal edema  Gastrointestinal system: Abdomen has a bandage with no sign of bleeding. Bowel sound sluggish Central nervous system: Alert awake and following  commands. Extremities: Symmetric 5 x 5 power. Skin: No rashes, lesions or ulcers    Data Reviewed: I have personally reviewed following labs and imaging studies  CBC:  Recent Labs Lab 05/29/17 0242 05/29/17 0631 05/30/17 0644 05/31/17 0402  WBC 8.8 9.1 9.1 8.5  NEUTROABS 6.4  --   --   --   HGB 11.3* 10.7* 10.7* 12.4  HCT 33.7* 32.0* 32.5* 38.6  MCV 86.2 88.4 87.4 89.1  PLT 385 322 364 295   Basic Metabolic Panel:  Recent Labs Lab 05/29/17 0242 05/29/17 0631 05/30/17 0644 05/31/17 0402  NA 129* 134* 136 137  K 3.6 3.6 4.1 4.5  CL 94* 98* 102 110  CO2 25 28 24  17*  GLUCOSE 182* 140* 96 249*  BUN 26* 26* 21* 26*  CREATININE 0.86 0.85 0.88 1.54*  CALCIUM 8.3* 8.0* 8.0* 6.8*  MG  --   --  1.8  --    GFR: Estimated Creatinine Clearance: 43.7 mL/min (A) (by C-G formula based on SCr of 1.54 mg/dL (H)). Liver Function Tests:  Recent Labs Lab 05/29/17 0242  AST 21  ALT 23  ALKPHOS 68  BILITOT 0.4  PROT 5.8*  ALBUMIN 2.8*   No results for input(s): LIPASE, AMYLASE in the last 168 hours. No results for input(s): AMMONIA in the last 168 hours. Coagulation Profile: No results for input(s): INR, PROTIME in the last 168 hours. Cardiac Enzymes: No results for input(s): CKTOTAL, CKMB, CKMBINDEX, TROPONINI in the last 168 hours. BNP (last 3 results) No results for input(s): PROBNP in the last 8760 hours. HbA1C: No results for input(s): HGBA1C in the last 72 hours. CBG:  Recent Labs Lab 05/30/17 1553 05/30/17 2042 05/30/17 2044 05/31/17 0012 05/31/17 1125  GLUCAP 126* 116* 138* 147* 239*   Lipid Profile: No results for input(s): CHOL, HDL, LDLCALC, TRIG, CHOLHDL, LDLDIRECT in the last 72 hours. Thyroid Function Tests: No results for input(s): TSH, T4TOTAL, FREET4, T3FREE, THYROIDAB in the last 72 hours. Anemia Panel: No results for input(s): VITAMINB12, FOLATE, FERRITIN, TIBC, IRON, RETICCTPCT in the last 72 hours. Sepsis Labs:  Recent Labs Lab  05/29/17 0254 05/31/17 0520 05/31/17 1024  LATICACIDVEN 1.19 2.6* 2.9*    No results found for this or any previous visit (from the past 240 hour(s)).       Radiology Studies: Dg Chest Port 1 View  Result Date: 05/31/2017 CLINICAL DATA:  Persistent hypotension EXAM: PORTABLE CHEST 1 VIEW COMPARISON:  05/29/2017 FINDINGS: NG tube tip is in the stomach. There are low lung volumes with bibasilar atelectasis. Mild cardiomegaly. No effusions or acute bony abnormality. IMPRESSION: Low lung volumes, bibasilar atelectasis. Electronically Signed   By: Rolm Baptise M.D.   On: 05/31/2017 09:40        Scheduled Meds: . Chlorhexidine Gluconate Cloth  6 each Topical Q0600  . insulin aspart  0-15 Units Subcutaneous Q4H  . pantoprazole (PROTONIX) IV  40 mg Intravenous Q24H   Continuous Infusions: . dextrose 5 % and 0.45% NaCl 125 mL/hr (05/31/17 0853)  . levETIRAcetam Stopped (05/31/17 0041)  . norepinephrine (LEVOPHED) Adult infusion 4 mcg/min (05/31/17 1100)  .  piperacillin-tazobactam Stopped (05/31/17 1147)  . sodium chloride    . vancomycin 750 mg (05/31/17 1158)     LOS: 2 days    Jenya Putz Tanna Furry, MD [facility] Pager [phone]  If 7PM-7AM, please contact night-coverage [email or web address] Password [facility] 05/31/2017, 12:08 PM

## 2017-05-31 NOTE — Progress Notes (Signed)
CRITICAL VALUE ALERT  Critical Value: 2.6  Date & Time Notied: 05/31/2016 05:56  Provider Notified: Gaetano Net, NP  Orders Received/Actions taken: new orders received, RB

## 2017-05-31 NOTE — Progress Notes (Signed)
CRITICAL VALUE ALERT  Critical Value:  Lactic acid  3.4  Date & Time Notied:  6/28  @ 1750  Provider Notified:  elink pphysician  Orders Received/Actions taken:  No new orders at this time

## 2017-05-31 NOTE — Progress Notes (Signed)
[place] [facility] Progress Note  1 Day Post-Op  Subjective: CC: pain in right shoulder Patient with abdominal pain and pain in right shoulder, states shoulder pain is worse. Denies N/V. No flatus. Son present at bedside. Discussed surgical findings with patient, son was very upset by this.   Objective: Vital signs in last 24 hours: Temp:  [93 F (33.9 C)-99.7 F (37.6 C)] 93 F (33.9 C) (06/28 0649) Pulse Rate:  [29-121] 116 (06/28 0649) Resp:  [11-20] 20 (06/28 0649) BP: (66-153)/(48-97) 87/60 (06/28 0649) SpO2:  [82 %-100 %] 99 % (06/28 0400) Last BM Date: 05/27/17   I rechecked oral temp - 97.6 F  Intake/Output from previous day: 06/27 0701 - 06/28 0700 In: 5287.9 [I.V.:4672.9; NG/GT:60; IV Piggyback:555] Out: 955 [Urine:530; Emesis/NG output:225; Blood:200] Intake/Output this shift: No intake/output data recorded.  PE: Gen:  Alert, NAD, pleasant Card:  Regular rate and rhythm, radial pulses 2+ BL Pulm:  Normal effort, clear to auscultation bilaterally Abd: Soft, appropriately TTP, non-distended, bowel sounds present in all 4 quadrants midline dressing C/D/I Skin: warm and dry, no rashes  Psych: depressed mood, appropriate for situation    Lab Results:   Recent Labs  05/30/17 0644 05/31/17 0402  WBC 9.1 8.5  HGB 10.7* 12.4  HCT 32.5* 38.6  PLT 364 334   BMET  Recent Labs  05/30/17 0644 05/31/17 0402  NA 136 137  K 4.1 4.5  CL 102 110  CO2 24 17*  GLUCOSE 96 249*  BUN 21* 26*  CREATININE 0.88 1.54*  CALCIUM 8.0* 6.8*   CMP     Component Value Date/Time   NA 137 05/31/2017 0402   K 4.5 05/31/2017 0402   CL 110 05/31/2017 0402   CO2 17 (L) 05/31/2017 0402   GLUCOSE 249 (H) 05/31/2017 0402   BUN 26 (H) 05/31/2017 0402   CREATININE 1.54 (H) 05/31/2017 0402   CALCIUM 6.8 (L) 05/31/2017 0402   PROT 5.8 (L) 05/29/2017 0242   ALBUMIN 2.8 (L) 05/29/2017 0242   AST 21 05/29/2017 0242   ALT 23 05/29/2017 0242   ALKPHOS 68 05/29/2017 0242   BILITOT 0.4 05/29/2017 0242   GFRNONAA 34 (L) 05/31/2017 0402   GFRAA 39 (L) 05/31/2017 0402   Lipase     Component Value Date/Time   LIPASE 31 05/14/2017 2106    Anti-infectives: Anti-infectives    Start     Dose/Rate Route Frequency Ordered Stop   05/30/17 1800  piperacillin-tazobactam (ZOSYN) IVPB 3.375 g     3.375 g 12.5 mL/hr over 240 Minutes Intravenous Every 8 hours 05/30/17 1734 06/06/17 2159   05/30/17 1159  ceFAZolin (ANCEF) 2-4 GM/100ML-% IVPB    Comments:  Bridget Hartshorn   : cabinet override      05/30/17 1159 05/30/17 1259   05/30/17 0600  ceFAZolin (ANCEF) IVPB 2g/100 mL premix     2 g 200 mL/hr over 30 Minutes Intravenous On call to O.R. 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 Rolanda Jay HTN Hx of cerebral aneurysm repair Hx of seizures Type II DM- diet controlled   FEN - NPO, IVF. K+ 4.5, changed maintenance fluids. VTE - SCDs ID - IV Zosyn (6/27>>)  Plan: Continue with supportive post-operative management. Oncology consult.   LOS: 2 days    Brigid Re , [facility] Surgery 05/31/2017, 7:27 AM Pager: [phone] Consults: [phone] Mon-Fri 7:00 am-4:30 pm Sat-Sun 7:00 am-11:30 am

## 2017-05-31 NOTE — Progress Notes (Signed)
Wasted 5 ml of Morphine with Dellie Catholic, RN.

## 2017-05-31 NOTE — Consult Note (Signed)
[facility] New Visit:  Assessment: 66 year old female with history of cervical carcinoma with potential recurrence in the retroperitoneal lymph nodes treated initially with chemoradiotherapy, vaginal cuff brachytherapy and followed by salvage radiotherapy to the suspected recurrence site. 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 Obstruction was surgically released and patient underwent enterocolostomy. Continues to require pressor support due to septic shock caused by peritoneal abscess. Patient is receiving appropriately broad coverage with antibiotics which include piperacillin/tazobactam and vancomycin at this point in time.  Our presence has been requested by the family to answer some questions regarding potential management of her diagnosed cancer recurrence.  At this time, patient remains in critical condition and needs continued close monitoring. Based on her current performance status, presence of active infection, and recent abdominal surgery, she is not an immediate candidate for initiation of systemic chemotherapy. This has been discussed extensively in presence of her family including presence of her son on the phone. Additional workup including potential restaging imaging with MRI of the abdomen and CT of the chest or a PET CT over the entire body will be necessary prior to initiation of therapy. Pathology sample obtained will need to be submitted for possible presence of KRAS, NRAS, HRAS and BRAF mutations, as well as additional testing for MSI status despite previous evaluation on the original pathology last year.  All questions were answered.    Recommendations: --Continue current management description of the primary treating service --Following discharge of the patient from the hospital, refer her back for follow up with her usual oncological provider's to discuss systemic chemotherapy treatment. Patient will need minimum of 2 weeks and optimally 4 weeks of recovery time from her recent surgery prior to initiating systemic chemotherapy. --I have requested the addition off the above-mentioned oncological testing from the pathology sample already available.  Voice recognition software was used and creation of this note. Despite my best effort at editing the text, some misspelling/errors may have occurred. Thank you for involving [place] in the care of this patient. No hesitate to contact us with additional questions, my personal pager is [phone]. I will continue following the patient lingular date my recommendations if the change in the future.  This note was electronically signed.    History of Presenting Illness Lori [name] is a 66 y.o. female currently admitted after undergoing exploratory laparotomy following presentation with small bowel obstruction. Auscultation has been requested to assist with answering questions of the family regarding management of pathology-confirmed recurrent malignancy. Please see oncologic history below for details.  Patient was admitted on 05/29/17 due to nausea, vomiting, abdominal pain with no passage of bowel movements over 4 days. Patient has had progressive weakness, deteriorating performance status. Of note, patient was recently hospitalized 10 days prior to the current admission for the same symptoms. Those improved with conservative management. Imaging demonstrated small bowel obstruction. NG tube was placed on admission, L surgery has evaluated the patient. Due to severity of obstruction, patient was taken to the operating room on 05/30/17 where she underwent initially a laparoscopic exploration  with biopsy of peritoneal followed by her laparotomy, drainage of abdominal abscess and repair of small bowel perforation followed by enterocolostomy. Presently, patient remains hospitalized and stepdown unit recovering from septic shock secondary to intraperitoneal abscess.  Pathology  sample obtained during the surgery demonstrates presence of mucinous adenocarcinoma, morphologically consistent with previous diagnosis of appendiceal mucinous adenocarcinoma. At the present time, patient continues to be ill. She denies any active fevers, chills, or night sweats. She does occasionally feel warm, she continues to feel weak with no significant appetite. She does report dry mouth, but she is currently continued on nasogastric tube. She denies any active nausea. Abdominal pain is reasonably well-controlled. Patient has been passing gas, but no stool so far. Denies any shortness of breath or chest pain. No palpitations.  Since family has multiple questions regarding the current situation of the patient, status of her malignancy, and management choices.   Oncological/hematological History:  **Mucinous Adenocarcinoma of appendix, recurrent metastatic disease (peritoneal carcinomatosis), Stage IV (pT4a Nx M1): Clinical Course: --Initial Dx, 04/04/16: PET-CT -- thickening involving the distal aspect of the appendix which measures up to 1.2 cm, image 134 of series 4. There is low level FDG uptake identified within this area within SUV max equal to 4.3. Unchanged from previous exam. --Surgical Pathology, 04/19/16: Positive for mucinous adenocarcinoma consistent with appendiceal primary. Positive mesenteric margin, negative proximal margin, negative for lymphovascular invasion, positive perineural invasion, no lymph node sampled. IHC -- positiv efor CK20, CDX-2, p16, CEA & negative for CK7, ER. --PET-CT, 08/11/16: No evidence of hypermetabolic appendiceal carcinoma recurrence or metastasis in the abdomen or pelvis.  Metabolic activity at the level the cervix difficult to assess as adjacent to bladder activity. No hypermetabolic lymph nodes in the abdomen or pelvis. No distant disease. --CT A/P, 05/15/17: Mechanical small bowel obstruction, the stomach is markedly enlarged. Overall increased dilatation of the small bowel loops since the previous study, consistent with worsening obstruction. No free air, however increasing free fluid in the abdomen and pelvis. --Surgical Pathology, 05/30/17: Adenocarcinoma with mucinous features consistent with previous appendiceal primary.  Treatment Hx: --Surgery (Dr Deland Pretty), 04/19/16: Laparoscopic appendectomy. R1 resection --No systemic therapy administered after case review with the GI Tumor Board Caroleen Hamman, PA-C) --Surgery (Dr Doralee Albino), 05/30/17: Diagnostic laparoscopy with peritoneal no acute biopsy. Exploratory laparotomy, drainage of abdominal abscess, repair of small bowel perforation, repair of small bowel enterotomy, enterocolostomy.    **Carcinoma of the uterine cervix, FIGO Stage IIB, HPV p16-positive: Clinical Course: --Initial Dx, 04/05/15: Endometrial biopsy -- Adenocarcinoma, HPV 16 positive. --Recurrence, 01/12/16: PET-CT -- Single hypermetabolic retroperitoneal left periaortic lymph node, worrisome for metastatic disease. No additional areas of abnormal hypermetabolism in the neck, chest, abdomen or pelvis. --PET-CT, 04/04/16: Interval decrease in FDG uptake associated with retroperitoneal hypermetabolic lymph node reported on previous exam. Findings compatible with response to therapy. --PET-CT, 02/02/17: New hypermetabolic 0.7 cm medial right upper lobe pulmonary nodule. Slight growth of additional tiny 0.3 cm apical right upper lobe pulmonary nodule. Findings are suspicious for pulmonary metastases. No additional sites of hypermetabolic metastatic disease. No evidence of hypermetabolic recurrence in the cervix or appendectomy bed.  Treatment  Hx: --Chemoradiotherapy ([facility]), 07/18-08/22/16: EBRT + concurrent QWk ciplatin --Radiation Therapy (Dr Melina Modena, [facility]), 08/24-09/22/16: Received 27.5 Gy to the cervical region/5 fractions.  --[facility] Radiotherapy, Mar 2017 (Dr Melina Modena): Radiation to the aortic lymphadenopathy site.  Medical History: Past Medical History:  Diagnosis Date  . Anemia 2017   3u blood 2017  . Arthritis    KNEES  . Carcinoma of appendix ([place]) 04/11/2016   Patient with a history of cervical cancer status post chemoradiation with a persistent finding of a dilated appendix with hypermetabolism. This is raised a concern  of malignancy. We'll plan to move forward with laparoscopic possible open appendectomy to rule out malignancy.   . Cervical cancer, FIGO stage IIB ([place]) 05/24/2015  . Cervical carcinoma [facility]) oncologist--  dr Sondra Come for radiation/   [facility] for chemotherapy   endocervical adenocarcinoma w/  Nodal METS--  FIGO Stage IIB  . Depression   . Endometrial carcinoma [facility])    goes to [facility] in [place] for chemotherapy  . GERD (gastroesophageal reflux disease)   . Heart murmur   . History of blood transfusion   . 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 History of radiation therapy 07/28/15-08/26/15   cervical region 27.5 gray  . History of seizures    post-op craniotomy for aneurysm 1997-- controlled w/ medication since then no seizures  . Hyperlipidemia   . Hypertension   . PMB (postmenopausal bleeding)   . Pulmonary nodule 03/26/2017  . Seizures ([place])    hx of years ago   . Type 2 diabetes, diet controlled ([place])    diet controlled     Surgical History: Past Surgical History:  Procedure Laterality Date  . COLONOSCOPY    . CRANIOTOMY POSTERIOR FOSSA  1997   Repair aneurysm  x2  left side  . LAPAROSCOPIC APPENDECTOMY N/A 04/19/2016   Procedure: APPENDECTOMY LAPAROSCOPIC;  Surgeon:  Hall Busing, MD;  Location: [facility];  Service: General;  Laterality: N/A;  . LAPAROSCOPY N/A 05/30/2017   Procedure: LAPAROSCOPY DIAGNOSTIC WITH PERITONEAL BIOPSY;  Surgeon: Jackolyn Confer, MD;  Location: [facility];  Service: General;  Laterality: N/A;  . LAPAROTOMY  05/30/2017   Procedure: EXPLORATORY LAPAROTOMY REPAIR WITH DRAINAGE OF INTRA ABDOMINAL ABSCESS OF SMALL BOWEL PERFORATION WITH ENTEROCOLOSTOMY;  Surgeon: Jackolyn Confer, MD;  Location: [facility];  Service: General;;  . PORT-A-CATH PLACEMENT  06/  2016  . TANDEM RING INSERTION N/A 07/28/2015   Procedure: TANDEM RING PLACEMENT;  Surgeon: Gery Pray, MD;  Location: [facility];  Service: Urology;  Laterality: N/A;  . TANDEM RING INSERTION N/A 08/05/2015   Procedure: TANDEM RING PLACEMENT ;  Surgeon: Gery Pray, MD;  Location: [facility];  Service: Urology;  Laterality: N/A;  . TANDEM RING INSERTION N/A 08/11/2015   Procedure: TANDEM RING PLACEMENT ;  Surgeon: Gery Pray, MD;  Location: [facility];  Service: Urology;  Laterality: N/A;  . TANDEM RING INSERTION N/A 08/17/2015   Procedure: TANDEM RING PLACEMENT ;  Surgeon: Gery Pray, MD;  Location: [facility];  Service: Urology;  Laterality: N/A;  . TANDEM RING INSERTION N/A 08/26/2015   Procedure: TANDEM RING PLACEMENT ;  Surgeon: Gery Pray, MD;  Location: [facility];  Service: Urology;  Laterality: N/A;    Family History: Family History  Problem Relation Age of Onset  . Throat cancer Father   . Cervical cancer Sister     Social History: Social History   Social History  . Marital status: Single    Spouse name: N/A  . Number of children: N/A  . Years of education: N/A   Occupational History  . Not on file.   Social History Main Topics  . Smoking status: Former Smoker    Packs/day: 0.25    Years: 5.00    Types: Cigarettes    Quit date: 12/05/1995  . Smokeless tobacco: Never Used  . Alcohol  use No  . Drug use:  No  . Sexual activity: Not on file   Other Topics Concern  . Not on file   Social History Narrative  . No narrative on file    Allergies: Allergies  Allergen Reactions  . Contrast Media [Iodinated Diagnostic Agents] Itching and Swelling  . Dilantin [Phenytoin Sodium Extended] Itching, Swelling and Other (See Comments)    Whole body  . Latex Hives and Itching  . Adhesive [Tape] Rash    Medications:  Current Facility-Administered Medications  Medication Dose Route Frequency Provider Last Rate Last Dose  . acetaminophen (TYLENOL) suppository 650 mg  650 mg Rectal Q6H PRN Rayburn, Floyce Stakes, PA-C      . Chlorhexidine Gluconate Cloth 2 % PADS 6 each  6 each Topical Q0600 Rosita Fire, MD   6 each at 05/31/17 0845  . dextrose 5 %-0.45 % sodium chloride infusion   Intravenous Continuous Rayburn, Kelly A, PA-C 125 mL/hr at 05/31/17 0853 125 mL/hr at 05/31/17 0853  . insulin aspart (novoLOG) injection 0-15 Units  0-15 Units Subcutaneous Q4H Juanito Doom, MD   5 Units at 05/31/17 1139  . levETIRAcetam (KEPPRA) 500 mg in sodium chloride 0.9 % 100 mL IVPB  500 mg Intravenous Q12H Rosita Fire, MD   Stopped at 05/31/17 0041  . morphine 2 MG/ML injection 2 mg  2 mg Intravenous Q3H PRN Simonne Maffucci B, MD   2 mg at 05/31/17 1455  . norepinephrine (LEVOPHED) 4 mg in dextrose 5 % 250 mL (0.016 mg/mL) infusion  0-40 mcg/min Intravenous Titrated Rosita Fire, MD 11.3 mL/hr at 05/31/17 1459 3 mcg/min at 05/31/17 1459  . ondansetron (ZOFRAN) tablet 4 mg  4 mg Oral Q6H PRN Rise Patience, MD       Or  . ondansetron [facility]) injection 4 mg  4 mg Intravenous Q6H PRN Rise Patience, MD   4 mg at 05/30/17 1247  . pantoprazole (PROTONIX) injection 40 mg  40 mg Intravenous Q24H Rosita Fire, MD   40 mg at 05/31/17 1057  . piperacillin-tazobactam (ZOSYN) IVPB 3.375 g  3.375 g Intravenous Q8H Rayburn, Kelly A, PA-C   Stopped at 05/31/17  1147  . sodium chloride 0.9 % bolus 500 mL  500 mL Intravenous Once Rayburn, Kelly A, PA-C      . sodium chloride flush (NS) 0.9 % injection 10-40 mL  10-40 mL Intracatheter PRN Rosita Fire, MD       Facility-Administered Medications Ordered in Other Encounters  Medication Dose Route Frequency Provider Last Rate Last Dose  . sodium chloride flush (NS) 0.9 % injection 10 mL  10 mL Intravenous PRN [facility], PA-C   10 mL at 03/26/17 1050    Review of Systems: Review of Systems  Constitutional: Positive for appetite change, chills and fatigue. Negative for diaphoresis and fever.  HENT:   Negative for mouth sores, sore throat and trouble swallowing.   Respiratory: Negative for chest tightness, cough, hemoptysis, shortness of breath and wheezing.   Cardiovascular: Positive for leg swelling. Negative for chest pain and palpitations.  Gastrointestinal: Positive for abdominal distention, abdominal pain, constipation and nausea. Negative for blood in stool, diarrhea, rectal pain and vomiting.  Genitourinary: Negative for bladder incontinence, difficulty urinating, hematuria, pelvic pain and vaginal bleeding.   Musculoskeletal: Negative.   Skin: Negative.   Neurological: Negative.   Hematological: Negative.   Psychiatric/Behavioral: Negative.      PHYSICAL EXAMINATION Blood pressure 92/61, pulse (!) 122, temperature 97.7 F (36.5 C),  temperature source Oral, resp. rate (!) 26, height 5' 7"  (1.702 m), weight 221 lb 5.5 oz (100.4 kg), SpO2 97 %.  ECOG PERFORMANCE STATUS: 4 - Bedbound  Physical Exam  Constitutional: She is oriented to person, place, and time.  Acutely ill female, somnolent, but arousable, interactive. Fatigues easily and appears to fall asleep intermittently.  HENT:  Head: Atraumatic.  Nasogastric tube in place. No mucosal lesions. Mucosal surfaces appeared to be dry.  Eyes: Pupils are equal, round, and reactive to light. No scleral icterus.  Neck: No  thyromegaly present.  Cardiovascular:  Rapid heart beat. No murmurs, rubs, or gallop.  Diffuse subcutaneous edema noted including lower and upper extremities.  Pulmonary/Chest:  Breath sounds bilaterally on the anterior auscultation without crackles or expiratory wheezing.  Abdominal:  Abdomen is distended, tender to palpation without rebound tenderness at this time. Surgical incision intact without leakage  Musculoskeletal:  Patient is resting in bed. He appears to be able to move all 4 extremities without difficulty.  Lymphadenopathy:    She has no cervical adenopathy.  Neurological: She is oriented to person, place, and time.  Somnolent. No focal neurological deficits appreciated.  Skin:  Skin appears to be cold clammy.     LABORATORY DATA: I have personally reviewed the data as listed: Admission on 05/29/2017  Component Date Value Ref Range Status  . WBC 05/29/2017 8.8  4.0 - 10.5 K/uL Final  . RBC 05/29/2017 3.91  3.87 - 5.11 MIL/uL Final  . Hemoglobin 05/29/2017 11.3* 12.0 - 15.0 g/dL Final  . HCT 05/29/2017 33.7* 36.0 - 46.0 % Final  . MCV 05/29/2017 86.2  78.0 - 100.0 fL Final  . MCH 05/29/2017 28.9  26.0 - 34.0 pg Final  . MCHC 05/29/2017 33.5  30.0 - 36.0 g/dL Final  . RDW 05/29/2017 14.3  11.5 - 15.5 % Final  . Platelets 05/29/2017 385  150 - 400 K/uL Final  . Neutrophils Relative % 05/29/2017 73  % Final  . Neutro Abs 05/29/2017 6.4  1.7 - 7.7 K/uL Final  . Lymphocytes Relative 05/29/2017 15  % Final  . Lymphs Abs 05/29/2017 1.3  0.7 - 4.0 K/uL Final  . Monocytes Relative 05/29/2017 10  % Final  . Monocytes Absolute 05/29/2017 0.9  0.1 - 1.0 K/uL Final  . Eosinophils Relative 05/29/2017 2  % Final  . Eosinophils Absolute 05/29/2017 0.1  0.0 - 0.7 K/uL Final  . Basophils Relative 05/29/2017 0  % Final  . Basophils Absolute 05/29/2017 0.0  0.0 - 0.1 K/uL Final  . Sodium 05/29/2017 129* 135 - 145 mmol/L Final  . Potassium 05/29/2017 3.6  3.5 - 5.1 mmol/L Final  .  Chloride 05/29/2017 94* 101 - 111 mmol/L Final  . CO2 05/29/2017 25  22 - 32 mmol/L Final  . Glucose, Bld 05/29/2017 182* 65 - 99 mg/dL Final  . BUN 05/29/2017 26* 6 - 20 mg/dL Final  . Creatinine, Ser 05/29/2017 0.86  0.44 - 1.00 mg/dL Final  . Calcium 05/29/2017 8.3* 8.9 - 10.3 mg/dL Final  . Total Protein 05/29/2017 5.8* 6.5 - 8.1 g/dL Final  . Albumin 05/29/2017 2.8* 3.5 - 5.0 g/dL Final  . AST 05/29/2017 21  15 - 41 U/L Final  . ALT 05/29/2017 23  14 - 54 U/L Final  . Alkaline Phosphatase 05/29/2017 68  38 - 126 U/L Final  . Total Bilirubin 05/29/2017 0.4  0.3 - 1.2 mg/dL Final  . GFR calc non Af Amer 05/29/2017 >60  >60 mL/min  Final  . GFR calc Af Amer 05/29/2017 >60  >60 mL/min Final   Comment: (NOTE) The eGFR has been calculated using the CKD EPI equation. This calculation has not been validated in all clinical situations. eGFR's persistently <60 mL/min signify possible Chronic Kidney Disease.   . Anion gap 05/29/2017 10  5 - 15 Final  . Lactic Acid, Venous 05/29/2017 1.19  0.5 - 1.9 mmol/L Final  . Sodium 05/29/2017 134* 135 - 145 mmol/L Final  . Potassium 05/29/2017 3.6  3.5 - 5.1 mmol/L Final  . Chloride 05/29/2017 98* 101 - 111 mmol/L Final  . CO2 05/29/2017 28  22 - 32 mmol/L Final  . Glucose, Bld 05/29/2017 140* 65 - 99 mg/dL Final  . BUN 05/29/2017 26* 6 - 20 mg/dL Final  . Creatinine, Ser 05/29/2017 0.85  0.44 - 1.00 mg/dL Final  . Calcium 05/29/2017 8.0* 8.9 - 10.3 mg/dL Final  . GFR calc non Af Amer 05/29/2017 >60  >60 mL/min Final  . GFR calc Af Amer 05/29/2017 >60  >60 mL/min Final   Comment: (NOTE) The eGFR has been calculated using the CKD EPI equation. This calculation has not been validated in all clinical situations. eGFR's persistently <60 mL/min signify possible Chronic Kidney Disease.   . Anion gap 05/29/2017 8  5 - 15 Final  . WBC 05/29/2017 9.1  4.0 - 10.5 K/uL Final  . RBC 05/29/2017 3.62* 3.87 - 5.11 MIL/uL Final  . Hemoglobin 05/29/2017 10.7*  12.0 - 15.0 g/dL Final  . HCT 05/29/2017 32.0* 36.0 - 46.0 % Final  . MCV 05/29/2017 88.4  78.0 - 100.0 fL Final  . MCH 05/29/2017 29.6  26.0 - 34.0 pg Final  . MCHC 05/29/2017 33.4  30.0 - 36.0 g/dL Final  . RDW 05/29/2017 14.5  11.5 - 15.5 % Final  . Platelets 05/29/2017 322  150 - 400 K/uL Final  . Glucose-Capillary 05/29/2017 136* 65 - 99 mg/dL Final  . Glucose-Capillary 05/29/2017 121* 65 - 99 mg/dL Final  . Sodium 05/30/2017 136  135 - 145 mmol/L Final  . Potassium 05/30/2017 4.1  3.5 - 5.1 mmol/L Final  . Chloride 05/30/2017 102  101 - 111 mmol/L Final  . CO2 05/30/2017 24  22 - 32 mmol/L Final  . Glucose, Bld 05/30/2017 96  65 - 99 mg/dL Final  . BUN 05/30/2017 21* 6 - 20 mg/dL Final  . Creatinine, Ser 05/30/2017 0.88  0.44 - 1.00 mg/dL Final  . Calcium 05/30/2017 8.0* 8.9 - 10.3 mg/dL Final  . GFR calc non Af Amer 05/30/2017 >60  >60 mL/min Final  . GFR calc Af Amer 05/30/2017 >60  >60 mL/min Final   Comment: (NOTE) The eGFR has been calculated using the CKD EPI equation. This calculation has not been validated in all clinical situations. eGFR's persistently <60 mL/min signify possible Chronic Kidney Disease.   . Anion gap 05/30/2017 10  5 - 15 Final  . Magnesium 05/30/2017 1.8  1.7 - 2.4 mg/dL Final  . Glucose-Capillary 05/30/2017 101* 65 - 99 mg/dL Final  . Comment 1 05/30/2017 Notify RN   Final  . WBC 05/30/2017 9.1  4.0 - 10.5 K/uL Final  . RBC 05/30/2017 3.72* 3.87 - 5.11 MIL/uL Final  . Hemoglobin 05/30/2017 10.7* 12.0 - 15.0 g/dL Final  . HCT 05/30/2017 32.5* 36.0 - 46.0 % Final  . MCV 05/30/2017 87.4  78.0 - 100.0 fL Final  . MCH 05/30/2017 28.8  26.0 - 34.0 pg Final  . MCHC 05/30/2017 32.9  30.0 - 36.0 g/dL Final  . RDW 05/30/2017 14.4  11.5 - 15.5 % Final  . Platelets 05/30/2017 364  150 - 400 K/uL Final  . Glucose-Capillary 05/30/2017 97  65 - 99 mg/dL Final  . ABO/RH(D) 05/30/2017 O POS   Final  . Antibody Screen 05/30/2017 NEG   Final  . Sample  Expiration 05/30/2017 06/02/2017   Final  . Glucose-Capillary 05/30/2017 126* 65 - 99 mg/dL Final  . Comment 1 05/30/2017 Notify RN   Final  . Comment 2 05/30/2017 Document in Chart   Final  . Sodium 05/31/2017 137  135 - 145 mmol/L Final  . Potassium 05/31/2017 4.5  3.5 - 5.1 mmol/L Final  . Chloride 05/31/2017 110  101 - 111 mmol/L Final  . CO2 05/31/2017 17* 22 - 32 mmol/L Final  . Glucose, Bld 05/31/2017 249* 65 - 99 mg/dL Final  . BUN 05/31/2017 26* 6 - 20 mg/dL Final  . Creatinine, Ser 05/31/2017 1.54* 0.44 - 1.00 mg/dL Final  . Calcium 05/31/2017 6.8* 8.9 - 10.3 mg/dL Final  . GFR calc non Af Amer 05/31/2017 34* >60 mL/min Final  . GFR calc Af Amer 05/31/2017 39* >60 mL/min Final   Comment: (NOTE) The eGFR has been calculated using the CKD EPI equation. This calculation has not been validated in all clinical situations. eGFR's persistently <60 mL/min signify possible Chronic Kidney Disease.   . Anion gap 05/31/2017 10  5 - 15 Final  . WBC 05/31/2017 8.5  4.0 - 10.5 K/uL Final  . RBC 05/31/2017 4.33  3.87 - 5.11 MIL/uL Final  . Hemoglobin 05/31/2017 12.4  12.0 - 15.0 g/dL Final  . HCT 05/31/2017 38.6  36.0 - 46.0 % Final  . MCV 05/31/2017 89.1  78.0 - 100.0 fL Final  . MCH 05/31/2017 28.6  26.0 - 34.0 pg Final  . MCHC 05/31/2017 32.1  30.0 - 36.0 g/dL Final  . RDW 05/31/2017 14.2  11.5 - 15.5 % Final  . Platelets 05/31/2017 334  150 - 400 K/uL Final  . Glucose-Capillary 05/30/2017 116* 65 - 99 mg/dL Final  . Glucose-Capillary 05/30/2017 138* 65 - 99 mg/dL Final  . Glucose-Capillary 05/31/2017 147* 65 - 99 mg/dL Final  . Lactic Acid, Venous 05/31/2017 2.6* 0.5 - 1.9 mmol/L Final   Comment: CRITICAL RESULT CALLED TO, READ BACK BY AND VERIFIED WITH: M.VERNER,RN 0557 05/31/17 W.SHEA   . Lactic Acid, Venous 05/31/2017 2.9* 0.5 - 1.9 mmol/L Final   Comment: CRITICAL RESULT CALLED TO, READ BACK BY AND VERIFIED WITH: S DILLOM,RN @1152  05/31/17 MKELLY   . Prealbumin 05/31/2017  8.3* 18 - 38 mg/dL Final   Performed at [facility], [place] [address] [place], [place] [address]  . Glucose-Capillary 05/31/2017 239* 65 - 99 mg/dL Final  . Comment 1 05/31/2017 Notify RN   Final  . Comment 2 05/31/2017 Document in Chart   Final         Ardath Sax, MD

## 2017-06-01 ENCOUNTER — Ambulatory Visit (HOSPITAL_COMMUNITY): Payer: Medicare Other

## 2017-06-01 ENCOUNTER — Other Ambulatory Visit: Payer: Self-pay

## 2017-06-01 DIAGNOSIS — C786 Secondary malignant neoplasm of retroperitoneum and peritoneum: Secondary | ICD-10-CM

## 2017-06-01 DIAGNOSIS — N179 Acute kidney failure, unspecified: Secondary | ICD-10-CM

## 2017-06-01 DIAGNOSIS — R569 Unspecified convulsions: Secondary | ICD-10-CM

## 2017-06-01 DIAGNOSIS — C801 Malignant (primary) neoplasm, unspecified: Secondary | ICD-10-CM

## 2017-06-01 LAB — CBC
HCT: 34.1 % — ABNORMAL LOW (ref 36.0–46.0)
Hemoglobin: 11.2 g/dL — ABNORMAL LOW (ref 12.0–15.0)
MCH: 29.3 pg (ref 26.0–34.0)
MCHC: 32.8 g/dL (ref 30.0–36.0)
MCV: 89.3 fL (ref 78.0–100.0)
PLATELETS: 334 10*3/uL (ref 150–400)
RBC: 3.82 MIL/uL — ABNORMAL LOW (ref 3.87–5.11)
RDW: 14.8 % (ref 11.5–15.5)
WBC: 15.7 10*3/uL — ABNORMAL HIGH (ref 4.0–10.5)

## 2017-06-01 LAB — LACTIC ACID, PLASMA
LACTIC ACID, VENOUS: 2.6 mmol/L — AB (ref 0.5–1.9)
Lactic Acid, Venous: 1.9 mmol/L (ref 0.5–1.9)

## 2017-06-01 LAB — GLUCOSE, CAPILLARY
GLUCOSE-CAPILLARY: 212 mg/dL — AB (ref 65–99)
GLUCOSE-CAPILLARY: 201 mg/dL — AB (ref 65–99)
GLUCOSE-CAPILLARY: 157 mg/dL — AB (ref 65–99)
Glucose-Capillary: 179 mg/dL — ABNORMAL HIGH (ref 65–99)
Glucose-Capillary: 166 mg/dL — ABNORMAL HIGH (ref 65–99)

## 2017-06-01 LAB — BASIC METABOLIC PANEL
ANION GAP: 8 (ref 5–15)
BUN: 32 mg/dL — ABNORMAL HIGH (ref 6–20)
CHLORIDE: 107 mmol/L (ref 101–111)
CO2: 18 mmol/L — AB (ref 22–32)
CREATININE: 1.3 mg/dL — AB (ref 0.44–1.00)
Calcium: 6.7 mg/dL — ABNORMAL LOW (ref 8.9–10.3)
GFR calc non Af Amer: 42 mL/min — ABNORMAL LOW (ref >60)
GFR, EST AFRICAN AMERICAN: 48 mL/min — AB (ref >60)
Glucose, Bld: 293 mg/dL — ABNORMAL HIGH (ref 65–99)
Potassium: 4.2 mmol/L (ref 3.5–5.1)
Sodium: 133 mmol/L — ABNORMAL LOW (ref 135–145)

## 2017-06-01 LAB — TROPONIN I: Troponin I: 0.06 ng/mL (ref <0.03)

## 2017-06-01 LAB — URINE CULTURE: Culture: NO GROWTH

## 2017-06-01 LAB — VANCOMYCIN, RANDOM: VANCOMYCIN RM: 8

## 2017-06-01 LAB — CORTISOL: Cortisol, Plasma: 35.6 ug/dL

## 2017-06-01 MED ORDER — INSULIN ASPART 100 UNIT/ML ~~LOC~~ SOLN
0.0000 [IU] | SUBCUTANEOUS | Status: DC
  Administered 2017-06-01 – 2017-06-02 (×4): 4 [IU] via SUBCUTANEOUS
  Administered 2017-06-02 (×3): 3 [IU] via SUBCUTANEOUS
  Administered 2017-06-03 (×2): 4 [IU] via SUBCUTANEOUS
  Administered 2017-06-03 (×3): 3 [IU] via SUBCUTANEOUS
  Administered 2017-06-04 (×2): 4 [IU] via SUBCUTANEOUS
  Administered 2017-06-04 (×2): 3 [IU] via SUBCUTANEOUS
  Administered 2017-06-04 – 2017-06-05 (×5): 4 [IU] via SUBCUTANEOUS
  Administered 2017-06-05: 20:00:00 7 [IU] via SUBCUTANEOUS
  Administered 2017-06-05 (×2): 4 [IU] via SUBCUTANEOUS
  Administered 2017-06-06: 14:00:00 3 [IU] via SUBCUTANEOUS
  Administered 2017-06-06 (×5): 4 [IU] via SUBCUTANEOUS
  Administered 2017-06-07: 3 [IU] via SUBCUTANEOUS
  Administered 2017-06-07 (×3): 4 [IU] via SUBCUTANEOUS
  Administered 2017-06-07 – 2017-06-08 (×3): 3 [IU] via SUBCUTANEOUS
  Administered 2017-06-08: 14:00:00 4 [IU] via SUBCUTANEOUS
  Filled 2017-06-01 (×3): qty 1

## 2017-06-01 MED ORDER — FLUCONAZOLE IN SODIUM CHLORIDE 400-0.9 MG/200ML-% IV SOLN
800.0000 mg | Freq: Once | INTRAVENOUS | Status: AC
  Administered 2017-06-01: 800 mg via INTRAVENOUS
  Filled 2017-06-01: qty 400

## 2017-06-01 MED ORDER — DIPHENHYDRAMINE HCL 50 MG/ML IJ SOLN
25.0000 mg | Freq: Four times a day (QID) | INTRAMUSCULAR | Status: DC | PRN
  Administered 2017-06-01 (×2): 25 mg via INTRAVENOUS
  Filled 2017-06-01 (×2): qty 1

## 2017-06-01 MED ORDER — FLUCONAZOLE IN SODIUM CHLORIDE 400-0.9 MG/200ML-% IV SOLN
400.0000 mg | INTRAVENOUS | Status: DC
  Administered 2017-06-02 – 2017-06-07 (×6): 400 mg via INTRAVENOUS
  Filled 2017-06-01 (×6): qty 200

## 2017-06-01 MED ORDER — ASPIRIN 81 MG PO CHEW
81.0000 mg | CHEWABLE_TABLET | Freq: Every day | ORAL | Status: DC
Start: 2017-06-01 — End: 2017-06-08
  Administered 2017-06-01 – 2017-06-08 (×8): 81 mg via ORAL
  Filled 2017-06-01 (×8): qty 1

## 2017-06-01 MED ORDER — MORPHINE SULFATE (PF) 2 MG/ML IV SOLN
1.0000 mg | INTRAVENOUS | Status: DC | PRN
  Administered 2017-06-01 – 2017-06-03 (×8): 1 mg via INTRAVENOUS
  Filled 2017-06-01 (×8): qty 1

## 2017-06-01 MED ORDER — VANCOMYCIN HCL IN DEXTROSE 750-5 MG/150ML-% IV SOLN
750.0000 mg | Freq: Two times a day (BID) | INTRAVENOUS | Status: DC
  Administered 2017-06-01 – 2017-06-02 (×4): 750 mg via INTRAVENOUS
  Filled 2017-06-01 (×7): qty 150

## 2017-06-01 NOTE — Progress Notes (Signed)
PULMONARY / CRITICAL CARE MEDICINE   Name: Lori [name] MRN: [id number] DOB: Sep 02, 1951    ADMISSION DATE:  05/29/2017 CONSULTATION DATE:  05/31/2017  REFERRING MD:  Carolin Sicks  CHIEF COMPLAINT:  Hypotension  BRIEF: 66 y/o female with septic shock from an intra-abdominal abscess related to a small bowel obstruction from metastatic adenocarcinoma.    SUBJECTIVE:  Feels OK Pressor requirement going down this morning  VITAL SIGNS: BP 90/64   Pulse (!) 132   Temp 100 F (37.8 C) (Axillary) Comment: notified nurse of elevated temp  Resp 17   Ht 5\' 7"  (1.702 m)   Wt 222 lb 10.6 oz (101 kg)   SpO2 100%   BMI 34.87 kg/m   HEMODYNAMICS:    VENTILATOR SETTINGS:    INTAKE / OUTPUT: I/O last 3 completed shifts: In: 5512.1 [I.V.:4207.1; IV Piggyback:1305] Out: 925 [Urine:675; Emesis/NG output:250]  PHYSICAL EXAMINATION: General:  Weak, lying in bed HENT: NCAT OP clear PULM: CTA B, normal effort CV: RRR, no mgr GI: BS+ today, soft, dressing c/d/i MSK: normal bulk and tone Neuro: awake, alert, no distress, MAEW   LABS:  BMET  Recent Labs Lab 05/30/17 0644 05/31/17 0402 06/01/17 0532  NA 136 137 133*  K 4.1 4.5 4.2  CL 102 110 107  CO2 24 17* 18*  BUN 21* 26* 32*  CREATININE 0.88 1.54* 1.30*  GLUCOSE 96 249* 293*    Electrolytes  Recent Labs Lab 05/30/17 0644 05/31/17 0402 06/01/17 0532  CALCIUM 8.0* 6.8* 6.7*  MG 1.8  --   --     CBC  Recent Labs Lab 05/29/17 0631 05/30/17 0644 05/31/17 0402  WBC 9.1 9.1 8.5  HGB 10.7* 10.7* 12.4  HCT 32.0* 32.5* 38.6  PLT 322 364 334    Coag's No results for input(s): APTT, INR in the last 168 hours.  Sepsis Markers  Recent Labs Lab 05/31/17 0520 05/31/17 1024 05/31/17 1620  LATICACIDVEN 2.6* 2.9* 3.4*    ABG No results for input(s): PHART, PCO2ART, PO2ART in the last 168 hours.  Liver Enzymes  Recent Labs Lab 05/29/17 0242  AST 21  ALT 23  ALKPHOS 68  BILITOT 0.4  ALBUMIN 2.8*     Cardiac Enzymes No results for input(s): TROPONINI, PROBNP in the last 168 hours.  Glucose  Recent Labs Lab 05/31/17 1125 05/31/17 1650 05/31/17 1925 05/31/17 2323 06/01/17 0331 06/01/17 0742  GLUCAP 239* 186* 196* 277* 212* 201*    Imaging Dg Chest Port 1 View  Result Date: 05/31/2017 CLINICAL DATA:  Persistent hypotension EXAM: PORTABLE CHEST 1 VIEW COMPARISON:  05/29/2017 FINDINGS: NG tube tip is in the stomach. There are low lung volumes with bibasilar atelectasis. Mild cardiomegaly. No effusions or acute bony abnormality. IMPRESSION: Low lung volumes, bibasilar atelectasis. Electronically Signed   By: Rolm Baptise M.D.   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  Noted to have an abscess on ex-lap 6/27.  PCCM consulted for septic shock on  6/28.    ASSESSMENT / PLAN:  INFECTIOUS A:   Septic shock, intra-abdominal source P:   Continue Vanc, Zosyn Add diflucan for anti-fungal coverage today  CARDIOVASCULAR A:  Septic shock > appears volume replete now, pressors weaning  P:  Wean levophed for MAP goal > 65 Tele Check 12 lead, troponin today for completeness Consider echo if no improvement BP monitoring Continue IVF Repeat lactic acid (ordered for 8 AM, will ensure re-collected)  PULMONARY A: Pulmonary nodules P:   Monitor O2 saturation F/U with Onc/tumor board who is managing the nodules  RENAL A:   AKI >  improving P: Monitor BMET and UOP Replace electrolytes as needed Continue D5 1/2ns today  GASTROINTESTINAL A:   SBO, s/p ex-lap (abscess drainage, repair of small bowel perforation, repair of small bowel enterotomy) P:   Ice chips Maintain NG tube Wound care per surgery  HEMATOLOGIC A:   Anemia without bleeding Multiple forms of cancer P:  Monitor for bleeding Transfuse if Hgb < 6gm/dL Check CBC now  ENDOCRINE A:   Hyperglycemia P:   SSI Goal Glucose 140-180, change SSI scale to resistant  NEUROLOGIC A:   Abdominal pain post procedure Seizure disorder P:   Prn morphine Keppra   FAMILY  - Updates: none bedside  - Inter-disciplinary family meet or Palliative Care meeting due by: day 7  My cc time 32 minutes  Roselie Awkward, MD [place] [facility] Pager: [phone] Cell: [phone] After 3pm or if no response, call [phone]    Sepsis - Repeat Assessment  Performed at:    0940  Vitals     Blood pressure (!) 82/56, pulse (!) 116, temperature (!) 93 F (33.9 C), temperature source Oral, resp. rate 20, height 5\' 8"  (1.727 m), weight 207 lb 7.3 oz (94.1 kg), SpO2 99 %.  Heart:     Tachycardic  Lungs:    CTA  Capillary Refill:   <2 sec  Peripheral Pulse:   Radial pulse palpable  Skin:     Normal Color     06/01/2017, 9:24 AM

## 2017-06-01 NOTE — Progress Notes (Signed)
Inpatient Diabetes Program Recommendations  AACE/ADA: New Consensus Statement on Inpatient Glycemic Control (2015)  Target Ranges:  Prepandial:   less than 140 mg/dL      Peak postprandial:   less than 180 mg/dL (1-2 hours)      Critically ill patients:  140 - 180 mg/dL   Lab Results  Component Value Date   GLUCAP 179 (H) 06/01/2017   HGBA1C 6.6 (H) 04/14/2016    Review of Glycemic Control  No hx DM. HgbA1C of 6.6% may indicate diagnosis of DM.  FBS 293, 249. NPO. On Novolog 0-20 units Q4H.  Inpatient Diabetes Program Recommendations:   ICU Glycemic Control Order Set  Will follow.  Thank you. Lorenda Peck, RD, LDN, CDE Inpatient Diabetes Coordinator [phone]

## 2017-06-01 NOTE — Progress Notes (Signed)
Pharmacy Antibiotic Note  Lori [name] is a 66 y.o. 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  She was recently hospitalized for SBO and discharged on 05/19/17.  She presented to the ED on 05/29/2017 with c/o abd pain with SBO noted on abd CT.  Patient undersent exp lap on 6/27 --   peritoneal nodule bx with drainage of abdominal abscess, repair of small bowel perforation, repair of small bowel enterotomy, and enterocolostomy performed.  Zosyn started on 05/30/17 for intra-abdominal infection.  To add vancomycin to abx regimen for suspected sepsis & Diflucan for IAI.  06/01/2017:  Tm 100.16F  WBC inc 15.7 today  Scr trending down (est CrCl ~ 50)  Vanc random level =8 (goal 15-20)  Plan: - Continue Zosyn 3.375 gm IV q8h (infuse over 4 hrs) - Vancomycin 750mg  IV q12h - Add Diflucan 800mg  IV x1 today followed by 400mg  IV daily - Daily scr ___________________________  Height: 5\' 7"  (170.2 cm) Weight: 222 lb 10.6 oz (101 kg) IBW/kg (Calculated) : 61.6  Temp (24hrs), Avg:98.8 F (37.1 C), Min:98.3 F (36.8 C), Max:100 F (37.8 C)   Recent Labs Lab 05/29/17 0242 05/29/17 0254 05/29/17 0631 05/30/17 0644 05/31/17 0402 05/31/17 0520 05/31/17 1024 05/31/17 1620 06/01/17 0532 06/01/17 0935  WBC 8.8  --  9.1 9.1 8.5  --   --   --   --  15.7*  CREATININE 0.86  --  0.85 0.88 1.54*  --   --   --  1.30*  --   LATICACIDVEN  --  1.19  --   --   --  2.6* 2.9* 3.4*  --   --     Estimated Creatinine Clearance: 52 mL/min (A) (by C-G formula based on SCr of 1.3 mg/dL (H)).    Allergies  Allergen Reactions  . Contrast Media [Iodinated Diagnostic Agents] Itching and Swelling  . Dilantin [Phenytoin Sodium Extended] Itching, Swelling and Other (See Comments)    Whole body  . Latex Hives and Itching  . Adhesive [Tape] Rash   Antimicrobials this admission:  6/27 zosyn>> 6/28 vanc>> 6/29 Diflucan>>  Dose adjustments  this admission:  6/29 10a VR= 8  (1500 mg x1)  Microbiology results:  6/28 BCx x2: IP 6/28 UCx: NG-F  Thank you for allowing pharmacy to be a part of this patient's care.  Biagio Borg 06/01/2017 10:17 AM

## 2017-06-01 NOTE — Progress Notes (Signed)
Rounded on patient around midnight, found patient's portha cath and PIV tubings on the floor. Patient said she mistakenly pulled them off because she was having generalized body itch. E-link MD notified, order for Benadryl received. IV consult was ordered for [facility] cath re access.

## 2017-06-01 NOTE — Progress Notes (Signed)
eLink Physician-Brief Progress Note Patient Name: Lori [name] DOB: 04-07-1951 MRN: [id number]   Date of Service  06/01/2017  HPI/Events of Note  C/O of generalized itching  eICU Interventions  PRN benadryl order     Intervention Category Minor Interventions: Routine modifications to care plan (e.g. PRN medications for pain, fever)  DETERDING,ELIZABETH 06/01/2017, 12:02 AM

## 2017-06-01 NOTE — Progress Notes (Signed)
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 Rolanda Jay HTN Hx of cerebral aneurysm repair Hx of seizures - on Keppra IV Type II DM- diet controlled   FEN - NPO, IVF VTE - SCDs ID - IV Zosyn (6/27>>)  Plan: Continue with supportive post-operative management. Midline wound care. Outpt oncology f/u. May need to start TPN if bowel function does not return soon, repeat lactic acid pending  Subjective:  CC: abdominal pain  Pt states no flatus or BM. No fever overnight. NGT output 250 cc in last 24hr and nurse states 150 in last 2 hours.  Objective: Vital signs in last 24 hours: Temp:  [98.3 F (36.8 C)-100 F (37.8 C)] 100 F (37.8 C) (06/29 0750) Pulse Rate:  [28-137] 132 (06/29 0330) Resp:  [15-32] 17 (06/29 0815) BP: (52-115)/(33-97) 90/64 (06/29 0815) SpO2:  [51 %-100 %] 100 % (06/29 0800) Weight:  [222 lb 10.6 oz (101 kg)] 222 lb 10.6 oz (101 kg) (06/29 0530) Last BM Date:  (unknown)  Intake/Output from previous day: 06/28 0701 - 06/29 0700 In: 4234.2 [I.V.:3134.2; IV Piggyback:1100] Out: 825 [Urine:575; Emesis/NG output:250] Intake/Output this  shift: Total I/O In: 330.7 [I.V.:330.7] Out: -   PE: Gen:  Alert, NAD, cooperative  Card:  tachycardic, regular rhythm Pulm:  Normal effort, [place] in place, rate normal Abd: Soft, appropriately TTP, non-distended, no bowel sounds appreciated, midline incision without purulent drainage noted (see photo below) Skin: warm and dry, no rashes  Psych: depressed mood         Lab Results:   Recent Labs  05/30/17 0644 05/31/17 0402  WBC 9.1 8.5  HGB 10.7* 12.4  HCT 32.5* 38.6  PLT 364 334   BMET  Recent Labs  05/31/17 0402 06/01/17 0532  NA 137 133*  K 4.5 4.2  CL 110 107  CO2 17* 18*  GLUCOSE 249* 293*  BUN 26* 32*  CREATININE 1.54* 1.30*  CALCIUM 6.8* 6.7*   PT/INR No results for input(s): LABPROT, INR in the last 72 hours. CMP     Component Value Date/Time   NA 133 (L) 06/01/2017 0532   K 4.2 06/01/2017 0532   CL 107 06/01/2017 0532   CO2 18 (L) 06/01/2017 0532   GLUCOSE 293 (H) 06/01/2017 0532   BUN 32 (H) 06/01/2017 0532   CREATININE 1.30 (H) 06/01/2017 0532   CALCIUM 6.7 (L) 06/01/2017 0532   PROT 5.8 (L) 05/29/2017 0242   ALBUMIN 2.8 (L) 05/29/2017 0242   AST 21 05/29/2017 0242   ALT 23 05/29/2017 0242   ALKPHOS 68 05/29/2017 0242   BILITOT 0.4 05/29/2017 0242   GFRNONAA 42 (L) 06/01/2017 0532   GFRAA 48 (L) 06/01/2017 0532   Lipase     Component Value Date/Time   LIPASE  31 05/14/2017 2106    Studies/Results: Dg Chest Port 1 View  Result Date: 05/31/2017 CLINICAL DATA:  Persistent hypotension EXAM: PORTABLE CHEST 1 VIEW COMPARISON:  05/29/2017 FINDINGS: NG tube tip is in the stomach. There are low lung volumes with bibasilar atelectasis. Mild cardiomegaly. No effusions or acute bony abnormality. IMPRESSION: Low lung volumes, bibasilar atelectasis. Electronically Signed   By: Rolm Baptise M.D.   On: 05/31/2017 09:40    Anti-infectives: Anti-infectives    Start     Dose/Rate Route Frequency Ordered Stop   05/31/17 1145  vancomycin (VANCOCIN)  IVPB 750 mg/150 ml premix     750 mg 150 mL/hr over 60 Minutes Intravenous NOW 05/31/17 1130 05/31/17 1258   05/31/17 0930  vancomycin (VANCOCIN) IVPB 750 mg/150 ml premix  Status:  Discontinued     750 mg 150 mL/hr over 60 Minutes Intravenous Every 12 hours 05/31/17 0911 05/31/17 1134   05/30/17 1800  piperacillin-tazobactam (ZOSYN) IVPB 3.375 g     3.375 g 12.5 mL/hr over 240 Minutes Intravenous Every 8 hours 05/30/17 1734 06/06/17 2159   05/30/17 1159  ceFAZolin (ANCEF) 2-4 GM/100ML-% IVPB    Comments:  Bridget Hartshorn   : cabinet override      05/30/17 1159 05/30/17 1259   05/30/17 0600  ceFAZolin (ANCEF) IVPB 2g/100 mL premix     2 g 200 mL/hr over 30 Minutes Intravenous On call to O.R. 05/29/17 1430 05/30/17 1259        LOS: 3 days    Kalman Drape , [facility] 06/01/2017, 9:16 AM Pager: [phone] Consults: [phone] Mon-Fri 7:00 am-4:30 pm Sat-Sun 7:00 am-11:30 am

## 2017-06-01 NOTE — Progress Notes (Signed)
  [facility] PCCM  Lactic acid trending down Hgb OK EKG with non-specific ST wave changes troponin slightly elevated, demand ischemia  Plan: Start 81mg  ASA when able to use gut Otherwise, continue current interventions  Roselie Awkward, MD [place] [facility] Pager: [phone] Cell: [phone] After 3pm or if no response, call [phone]

## 2017-06-02 LAB — BASIC METABOLIC PANEL
ANION GAP: 5 (ref 5–15)
BUN: 22 mg/dL — AB (ref 6–20)
CHLORIDE: 106 mmol/L (ref 101–111)
CO2: 20 mmol/L — AB (ref 22–32)
Calcium: 6.9 mg/dL — ABNORMAL LOW (ref 8.9–10.3)
Creatinine, Ser: 1.02 mg/dL — ABNORMAL HIGH (ref 0.44–1.00)
GFR calc Af Amer: >60 mL/min (ref >60)
GFR, EST NON AFRICAN AMERICAN: 56 mL/min — AB (ref >60)
GLUCOSE: 159 mg/dL — AB (ref 65–99)
POTASSIUM: 3.6 mmol/L (ref 3.5–5.1)
Sodium: 131 mmol/L — ABNORMAL LOW (ref 135–145)

## 2017-06-02 LAB — CBC
HCT: 30.6 % — ABNORMAL LOW (ref 36.0–46.0)
Hemoglobin: 10.1 g/dL — ABNORMAL LOW (ref 12.0–15.0)
MCH: 29.1 pg (ref 26.0–34.0)
MCHC: 33 g/dL (ref 30.0–36.0)
MCV: 88.2 fL (ref 78.0–100.0)
PLATELETS: 256 10*3/uL (ref 150–400)
RBC: 3.47 MIL/uL — AB (ref 3.87–5.11)
RDW: 14.8 % (ref 11.5–15.5)
WBC: 13.6 10*3/uL — AB (ref 4.0–10.5)

## 2017-06-02 LAB — GLUCOSE, CAPILLARY
GLUCOSE-CAPILLARY: 127 mg/dL — AB (ref 65–99)
GLUCOSE-CAPILLARY: 115 mg/dL — AB (ref 65–99)
Glucose-Capillary: 105 mg/dL — ABNORMAL HIGH (ref 65–99)
Glucose-Capillary: 108 mg/dL — ABNORMAL HIGH (ref 65–99)
Glucose-Capillary: 132 mg/dL — ABNORMAL HIGH (ref 65–99)
Glucose-Capillary: 142 mg/dL — ABNORMAL HIGH (ref 65–99)
Glucose-Capillary: 156 mg/dL — ABNORMAL HIGH (ref 65–99)

## 2017-06-02 LAB — TROPONIN I: Troponin I: 0.04 ng/mL (ref <0.03)

## 2017-06-02 MED ORDER — DEXTROSE-NACL 5-0.9 % IV SOLN
INTRAVENOUS | Status: AC
  Administered 2017-06-02 – 2017-06-03 (×3): via INTRAVENOUS

## 2017-06-02 NOTE — Progress Notes (Signed)
3 Days Post-Op   Subjective/Chief Complaint: No complaints. Says she feels better   Objective: Vital signs in last 24 hours: Temp:  [97.7 F (36.5 C)-99.2 F (37.3 C)] 99.2 F (37.3 C) (06/30 0800) Pulse Rate:  [125-139] 131 (06/30 0730) Resp:  [13-26] 19 (06/30 0730) BP: (74-146)/(35-87) 124/73 (06/30 0730) SpO2:  [96 %-100 %] 98 % (06/30 0730) Last BM Date:  (prior to admission)  Intake/Output from previous day: 06/29 0701 - 06/30 0700 In: 4333.9 [I.V.:3628.9; IV Piggyback:705] Out: 1650 [Urine:850; Emesis/NG output:800] Intake/Output this shift: No intake/output data recorded.  General appearance: alert and cooperative Resp: clear to auscultation bilaterally Cardio: regular rate and rhythm GI: soft, nontender. quiet. wound is stable  Lab Results:   Recent Labs  06/01/17 0935 06/02/17 0428  WBC 15.7* 13.6*  HGB 11.2* 10.1*  HCT 34.1* 30.6*  PLT 334 256   BMET  Recent Labs  06/01/17 0532 06/02/17 0428  NA 133* 131*  K 4.2 3.6  CL 107 106  CO2 18* 20*  GLUCOSE 293* 159*  BUN 32* 22*  CREATININE 1.30* 1.02*  CALCIUM 6.7* 6.9*   PT/INR No results for input(s): LABPROT, INR in the last 72 hours. ABG No results for input(s): PHART, HCO3 in the last 72 hours.  Invalid input(s): PCO2, PO2  Studies/Results: Dg Chest Port 1 View  Result Date: 05/31/2017 CLINICAL DATA:  Persistent hypotension EXAM: PORTABLE CHEST 1 VIEW COMPARISON:  05/29/2017 FINDINGS: NG tube tip is in the stomach. There are low lung volumes with bibasilar atelectasis. Mild cardiomegaly. No effusions or acute bony abnormality. IMPRESSION: Low lung volumes, bibasilar atelectasis. Electronically Signed   By: Rolm Baptise M.D.   On: 05/31/2017 09:40    Anti-infectives: Anti-infectives    Start     Dose/Rate Route Frequency Ordered Stop   06/02/17 1000  fluconazole (DIFLUCAN) IVPB 400 mg     400 mg 100 mL/hr over 120 Minutes Intravenous Every 24 hours 06/01/17 1017     06/01/17 1100   fluconazole (DIFLUCAN) IVPB 800 mg     800 mg 100 mL/hr over 240 Minutes Intravenous  Once 06/01/17 1017 06/01/17 1544   06/01/17 1100  vancomycin (VANCOCIN) IVPB 750 mg/150 ml premix     750 mg 150 mL/hr over 60 Minutes Intravenous Every 12 hours 06/01/17 1051     05/31/17 1145  vancomycin (VANCOCIN) IVPB 750 mg/150 ml premix     750 mg 150 mL/hr over 60 Minutes Intravenous NOW 05/31/17 1130 05/31/17 1258   05/31/17 0930  vancomycin (VANCOCIN) IVPB 750 mg/150 ml premix  Status:  Discontinued     750 mg 150 mL/hr over 60 Minutes Intravenous Every 12 hours 05/31/17 0911 05/31/17 1134   05/30/17 1800  piperacillin-tazobactam (ZOSYN) IVPB 3.375 g     3.375 g 12.5 mL/hr over 240 Minutes Intravenous Every 8 hours 05/30/17 1734 06/06/17 2159   05/30/17 1159  ceFAZolin (ANCEF) 2-4 GM/100ML-% IVPB    Comments:  Bridget Hartshorn   : cabinet override      05/30/17 1159 05/30/17 1259   05/30/17 0600  ceFAZolin (ANCEF) IVPB 2g/100 mL premix     2 g 200 mL/hr over 30 Minutes Intravenous On call to O.R. 05/29/17 1430 05/30/17 1259      Assessment/Plan: s/p Procedure(s): LAPAROSCOPY DIAGNOSTIC WITH PERITONEAL BIOPSY (N/A) EXPLORATORY LAPAROTOMY REPAIR WITH DRAINAGE OF INTRA ABDOMINAL ABSCESS OF SMALL BOWEL PERFORATION WITH ENTEROCOLOSTOMY continue ng and bowel rest until bowel function returns  Continue vanc/diflucan Critical care per CCM  LOS:  4 days    TOTH III,Leona Alen S 06/02/2017

## 2017-06-02 NOTE — Progress Notes (Addendum)
PULMONARY / CRITICAL CARE MEDICINE   Name: Lori [name] MRN: [id number] DOB: 01-08-1951    ADMISSION DATE:  05/29/2017 CONSULTATION DATE:  05/31/2017  REFERRING MD:  Carolin Sicks  CHIEF COMPLAINT:  Hypotension  BRIEF: 66 y/o female with septic shock from an intra-abdominal abscess related to a small bowel obstruction from metastatic adenocarcinoma.   SUBJECTIVE:  Off levo since 7 am Still tachycardic Denies any pain, dyspnea.  VITAL SIGNS: BP 107/81   Pulse (!) 118   Temp 99.2 F (37.3 C) (Oral)   Resp 14   Ht 5\' 7"  (1.702 m)   Wt 222 lb 10.6 oz (101 kg)   SpO2 100%   BMI 34.87 kg/m   HEMODYNAMICS:    VENTILATOR SETTINGS:    INTAKE / OUTPUT: I/O last 3 completed shifts: In: 6837.8 [I.V.:5382.8; IV Piggyback:1455] Out: 2525 [Urine:1475; Emesis/NG output:1050]  PHYSICAL EXAMINATION: Gen:      No acute distress HEENT:  EOMI, sclera anicteric Neck:     No masses; no thyromegaly Lungs:    Clear to auscultation bilaterally; normal respiratory effort CV:         Regular rate and rhythm; no murmurs Abd:      Mild tenderness. Abd wound dressed with no obvious discharge Ext:    No edema; adequate peripheral perfusion Skin:      Warm and dry; no rash Neuro: alert and oriented x 3 Psych: normal mood and affect   LABS:  BMET  Recent Labs Lab 05/31/17 0402 06/01/17 0532 06/02/17 0428  NA 137 133* 131*  K 4.5 4.2 3.6  CL 110 107 106  CO2 17* 18* 20*  BUN 26* 32* 22*  CREATININE 1.54* 1.30* 1.02*  GLUCOSE 249* 293* 159*    Electrolytes  Recent Labs Lab 05/30/17 0644 05/31/17 0402 06/01/17 0532 06/02/17 0428  CALCIUM 8.0* 6.8* 6.7* 6.9*  MG 1.8  --   --   --     CBC  Recent Labs Lab 05/31/17 0402 06/01/17 0935 06/02/17 0428  WBC 8.5 15.7* 13.6*  HGB 12.4 11.2* 10.1*  HCT 38.6 34.1* 30.6*  PLT 334 334 256    Coag's No results for input(s): APTT, INR in the last 168 hours.  Sepsis Markers  Recent Labs Lab 05/31/17 1620  06/01/17 0935 06/01/17 1356  LATICACIDVEN 3.4* 2.6* 1.9    ABG No results for input(s): PHART, PCO2ART, PO2ART in the last 168 hours.  Liver Enzymes  Recent Labs Lab 05/29/17 0242  AST 21  ALT 23  ALKPHOS 68  BILITOT 0.4  ALBUMIN 2.8*    Cardiac Enzymes  Recent Labs Lab 06/01/17 0935 06/02/17 0428  TROPONINI 0.06* 0.04*    Glucose  Recent Labs Lab 06/01/17 1527 06/01/17 1915 06/02/17 0004 06/02/17 0328 06/02/17 0708 06/02/17 1107  GLUCAP 166* 157* 156* 142* 132* 108*    Imaging No results found.   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  Noted to have an abscess on ex-lap 6/27.  PCCM consulted for septic shock on  6/28.    ASSESSMENT / PLAN:  INFECTIOUS A:   Septic shock, intra-abdominal source P:   Continue Vanc, Zosyn Add diflucan for anti-fungal coverage today  CARDIOVASCULAR A:  Septic shock > appears volume replete now Pressors off 6/30 Elevated troponins. Likely demand P:  Tele monitoring Continue IVF Follow TnI  PULMONARY A: Pulmonary nodules P:   Wean o2 as tolerated F/U with Onc/tumor board who is managing the nodules  RENAL A:   AKI > improving Hyponatremia P: Change D51/2 NS to DS NS Follow urine output and Cr  GASTROINTESTINAL A:   SBO, s/p ex-lap (abscess drainage, repair of small bowel perforation, repair of small bowel enterotomy) P:   NPO, bowel rest NG tube  HEMATOLOGIC A:   Anemia without  bleeding Multiple forms of cancer P:  Follow CBC Transfuse for Hb < 7  ENDOCRINE A:   Hyperglycemia P:   SSI coverage  NEUROLOGIC A:   Abdominal pain post procedure Seizure disorder P:   PRN morphine Keppra  FAMILY  - Updates: No family at bedside 6/30  - Inter-disciplinary family meet or Palliative Care meeting due by: day 7  The patient is critically ill with multiple organ system failure and requires high complexity decision making for assessment and support, frequent evaluation and titration of therapies, advanced monitoring, review of radiographic studies and interpretation of complex data.   Critical Care Time devoted to patient care services, exclusive of separately billable procedures, described in this note is 32 minutes.   Marshell Garfinkel MD [facility] Pager [phone] If no answer or after 3pm call: [phone] 06/02/2017, 11:31 AM

## 2017-06-03 ENCOUNTER — Inpatient Hospital Stay (HOSPITAL_COMMUNITY): Payer: Medicare Other

## 2017-06-03 LAB — PHOSPHORUS: PHOSPHORUS: 2.2 mg/dL — AB (ref 2.5–4.6)

## 2017-06-03 LAB — BASIC METABOLIC PANEL
Anion gap: 7 (ref 5–15)
BUN: 19 mg/dL (ref 6–20)
CALCIUM: 7 mg/dL — AB (ref 8.9–10.3)
CHLORIDE: 108 mmol/L (ref 101–111)
CO2: 20 mmol/L — ABNORMAL LOW (ref 22–32)
CREATININE: 0.93 mg/dL (ref 0.44–1.00)
Glucose, Bld: 184 mg/dL — ABNORMAL HIGH (ref 65–99)
Potassium: 3.5 mmol/L (ref 3.5–5.1)
SODIUM: 135 mmol/L (ref 135–145)

## 2017-06-03 LAB — GLUCOSE, CAPILLARY
GLUCOSE-CAPILLARY: 167 mg/dL — AB (ref 65–99)
GLUCOSE-CAPILLARY: 145 mg/dL — AB (ref 65–99)
GLUCOSE-CAPILLARY: 157 mg/dL — AB (ref 65–99)
Glucose-Capillary: 132 mg/dL — ABNORMAL HIGH (ref 65–99)
Glucose-Capillary: 150 mg/dL — ABNORMAL HIGH (ref 65–99)
Glucose-Capillary: 150 mg/dL — ABNORMAL HIGH (ref 65–99)

## 2017-06-03 LAB — MAGNESIUM: MAGNESIUM: 1.6 mg/dL — AB (ref 1.7–2.4)

## 2017-06-03 LAB — VANCOMYCIN, TROUGH: Vancomycin Tr: 11 ug/mL — ABNORMAL LOW (ref 15–20)

## 2017-06-03 LAB — CBC
HCT: 27.9 % — ABNORMAL LOW (ref 36.0–46.0)
HEMOGLOBIN: 9.1 g/dL — AB (ref 12.0–15.0)
MCH: 28.3 pg (ref 26.0–34.0)
MCHC: 32.6 g/dL (ref 30.0–36.0)
MCV: 86.9 fL (ref 78.0–100.0)
PLATELETS: 237 10*3/uL (ref 150–400)
RBC: 3.21 MIL/uL — ABNORMAL LOW (ref 3.87–5.11)
RDW: 14.7 % (ref 11.5–15.5)
WBC: 12.9 10*3/uL — ABNORMAL HIGH (ref 4.0–10.5)

## 2017-06-03 MED ORDER — MORPHINE SULFATE (PF) 4 MG/ML IV SOLN
1.0000 mg | INTRAVENOUS | Status: DC | PRN
  Administered 2017-06-03 – 2017-06-08 (×14): 1 mg via INTRAVENOUS
  Filled 2017-06-03 (×14): qty 1

## 2017-06-03 MED ORDER — MAGNESIUM SULFATE IN D5W 1-5 GM/100ML-% IV SOLN
1.0000 g | Freq: Once | INTRAVENOUS | Status: AC
  Administered 2017-06-03: 1 g via INTRAVENOUS
  Filled 2017-06-03: qty 100

## 2017-06-03 MED ORDER — TRACE MINERALS CR-CU-MN-SE-ZN 10-1000-500-60 MCG/ML IV SOLN
INTRAVENOUS | Status: AC
  Administered 2017-06-03: 18:00:00 via INTRAVENOUS
  Filled 2017-06-03 (×2): qty 960

## 2017-06-03 MED ORDER — POTASSIUM PHOSPHATES 15 MMOLE/5ML IV SOLN
10.0000 mmol | Freq: Once | INTRAVENOUS | Status: AC
  Administered 2017-06-03: 10 mmol via INTRAVENOUS
  Filled 2017-06-03: qty 3.33

## 2017-06-03 MED ORDER — VANCOMYCIN HCL IN DEXTROSE 1-5 GM/200ML-% IV SOLN
1000.0000 mg | Freq: Two times a day (BID) | INTRAVENOUS | Status: DC
  Administered 2017-06-03 – 2017-06-04 (×2): 1000 mg via INTRAVENOUS
  Filled 2017-06-03 (×3): qty 200

## 2017-06-03 MED ORDER — DEXTROSE-NACL 5-0.9 % IV SOLN
INTRAVENOUS | Status: AC
  Administered 2017-06-03 – 2017-06-04 (×2): via INTRAVENOUS

## 2017-06-03 NOTE — Progress Notes (Signed)
Pharmacy Antibiotic Note  Lori [name] is a 66 y.o. 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  She was recently hospitalized for SBO and discharged on 05/19/17.  She presented to the ED on 05/29/2017 with c/o abd pain with SBO noted on abd CT.  Patient undersent exp lap on 6/27 --   peritoneal nodule bx with drainage of abdominal abscess, repair of small bowel perforation, repair of small bowel enterotomy, and enterocolostomy performed.  She continues on Zosyn, Vancomycin, & Diflucan for IAI.  06/03/2017:  Day#4 antibiotics  Afebrile  WBC trending down  Scr trending down; improved to baseline (est CrCl ~ 70)  Vanc trough =11 (goal 15-20)  Plan: - Continue Zosyn 3.375 gm IV q8h (infuse over 4 hrs) - Increase Vancomycin 1000mg  IV S56C - Continue Diflucan 400mg  IV daily - Daily scr ___________________________  Height: 5\' 7"  (170.2 cm) Weight: 222 lb 10.6 oz (101 kg) IBW/kg (Calculated) : 61.6  Temp (24hrs), Avg:98.5 F (36.9 C), Min:98.1 F (36.7 C), Max:99.4 F (37.4 C)   Recent Labs Lab 05/30/17 0644 05/31/17 0402 05/31/17 0520 05/31/17 1024 05/31/17 1620 06/01/17 0532 06/01/17 0935 06/01/17 1356 06/02/17 0428 06/03/17 0419 06/03/17 1000  WBC 9.1 8.5  --   --   --   --  15.7*  --  13.6* 12.9*  --   CREATININE 0.88 1.54*  --   --   --  1.30*  --   --  1.02* 0.93  --   LATICACIDVEN  --   --  2.6* 2.9* 3.4*  --  2.6* 1.9  --   --   --   VANCOTROUGH  --   --   --   --   --   --   --   --   --   --  11*  VANCORANDOM  --   --   --   --   --   --  8  --   --   --   --     Estimated Creatinine Clearance: 72.7 mL/min (by C-G formula based on SCr of 0.93 mg/dL).    Allergies  Allergen Reactions  . Contrast Media [Iodinated Diagnostic Agents] Itching and Swelling  . Dilantin [Phenytoin Sodium Extended] Itching, Swelling and Other (See Comments)    Whole body  . Latex Hives and Itching  . Adhesive  [Tape] Rash   Antimicrobials this admission:  6/27 zosyn>> 6/28 vanc>> 6/29 Diflucan>>  Dose adjustments this admission:  6/29 10a VR= 8  (1500 mg x1) 7/1 1030 VT=11 on 750mg  IV q12h (before 5th dose)-->inc 1gm Q12h  Microbiology results:  6/28 BCx x2: NGTD 6/28 UCx: NG-F  Thank you for allowing pharmacy to be a part of this patient's care.  Biagio Borg 06/03/2017 11:41 AM

## 2017-06-03 NOTE — Progress Notes (Signed)
Received from ICU [id number]. Whisper [place], [employer]

## 2017-06-03 NOTE — Progress Notes (Signed)
4 Days Post-Op   Subjective/Chief Complaint: No complaints   Objective: Vital signs in last 24 hours: Temp:  [98.1 F (36.7 C)-99.4 F (37.4 C)] 98.4 F (36.9 C) (07/01 0800) Pulse Rate:  [115-133] 118 (07/01 0800) Resp:  [14-28] 16 (07/01 0800) BP: (94-124)/(59-81) 124/75 (07/01 0800) SpO2:  [91 %-100 %] 95 % (07/01 0800) Last BM Date:  (prior to admission)  Intake/Output from previous day: 06/30 0701 - 07/01 0700 In: 2216.8 [I.V.:1256.8; IV Piggyback:960] Out: 1150 [Urine:700; Emesis/NG output:450] Intake/Output this shift: Total I/O In: 1750 [I.V.:1750] Out: 100 [Urine:100]  General appearance: alert and cooperative Resp: clear to auscultation bilaterally Cardio: regular rate and rhythm GI: soft, minimal tenderness. good bs. no flatus yet  Lab Results:   Recent Labs  06/02/17 0428 06/03/17 0419  WBC 13.6* 12.9*  HGB 10.1* 9.1*  HCT 30.6* 27.9*  PLT 256 237   BMET  Recent Labs  06/02/17 0428 06/03/17 0419  NA 131* 135  K 3.6 3.5  CL 106 108  CO2 20* 20*  GLUCOSE 159* 184*  BUN 22* 19  CREATININE 1.02* 0.93  CALCIUM 6.9* 7.0*   PT/INR No results for input(s): LABPROT, INR in the last 72 hours. ABG No results for input(s): PHART, HCO3 in the last 72 hours.  Invalid input(s): PCO2, PO2  Studies/Results: No results found.  Anti-infectives: Anti-infectives    Start     Dose/Rate Route Frequency Ordered Stop   06/02/17 1000  fluconazole (DIFLUCAN) IVPB 400 mg     400 mg 100 mL/hr over 120 Minutes Intravenous Every 24 hours 06/01/17 1017     06/01/17 1100  fluconazole (DIFLUCAN) IVPB 800 mg     800 mg 100 mL/hr over 240 Minutes Intravenous  Once 06/01/17 1017 06/01/17 1544   06/01/17 1100  vancomycin (VANCOCIN) IVPB 750 mg/150 ml premix     750 mg 150 mL/hr over 60 Minutes Intravenous Every 12 hours 06/01/17 1051     05/31/17 1145  vancomycin (VANCOCIN) IVPB 750 mg/150 ml premix     750 mg 150 mL/hr over 60 Minutes Intravenous NOW 05/31/17  1130 05/31/17 1258   05/31/17 0930  vancomycin (VANCOCIN) IVPB 750 mg/150 ml premix  Status:  Discontinued     750 mg 150 mL/hr over 60 Minutes Intravenous Every 12 hours 05/31/17 0911 05/31/17 1134   05/30/17 1800  piperacillin-tazobactam (ZOSYN) IVPB 3.375 g     3.375 g 12.5 mL/hr over 240 Minutes Intravenous Every 8 hours 05/30/17 1734 06/06/17 2159   05/30/17 1159  ceFAZolin (ANCEF) 2-4 GM/100ML-% IVPB    Comments:  Bridget Hartshorn   : cabinet override      05/30/17 1159 05/30/17 1259   05/30/17 0600  ceFAZolin (ANCEF) IVPB 2g/100 mL premix     2 g 200 mL/hr over 30 Minutes Intravenous On call to O.R. 05/29/17 1430 05/30/17 1259      Assessment/Plan: s/p Procedure(s): LAPAROSCOPY DIAGNOSTIC WITH PERITONEAL BIOPSY (N/A) EXPLORATORY LAPAROTOMY REPAIR WITH DRAINAGE OF INTRA ABDOMINAL ABSCESS OF SMALL BOWEL PERFORATION WITH ENTEROCOLOSTOMY continue ng and bowel rest until bowel function returns  Overall slow improvement  LOS: 5 days    TOTH III,PAUL S 06/03/2017

## 2017-06-03 NOTE — Progress Notes (Signed)
PHARMACY - ADULT TOTAL PARENTERAL NUTRITION CONSULT NOTE   Pharmacy Consult for TPN Indication: bowel obstruction  Patient Measurements: Height: 5\' 7"  (170.2 cm) Weight: 222 lb 10.6 oz (101 kg) IBW/kg (Calculated) : 61.6 TPN AdjBW (KG): 71.3 Body mass index is 34.87 kg/m.  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  S/p exp lap on 6/27 --   peritoneal nodule bx with drainage of abdominal abscess, repair of small bowel perforation, repair of small bowel enterotomy, and enterocolostomy performed.  NG tube remains in place and on bowel rest.  Pharmacy is consulted to dose TPN.  Insulin Requirements:  Resistant SSI q4h - 13 units / 24 hrs  Current Nutrition: NPO (since 6/28)  IVF: D5 NS at 125 ml/hr  Central access: Implanted port TPN start date: 7/1  Significant events:  6/27 Ex-lap  Today:   Glucose (goal < 150), No hx DM but HgbA1C of 6.6%.  CBGs range 105-167.  Electrolytes - WNL except Mag/Phos low, CO2 low, CorrCa 7.96 low  Renal - SCr 0.93  LFTs - WNL (6/26)  TGs - pending  Prealbumin - 8.3 (6/28)  NUTRITIONAL GOALS                                                                                             RD recs: pending  RPh estimate:  Obese critically ill patient to receive high protein, hypocaloric feedings:   11-14 kcal/kg/day (TBW) = 1111-1414 kcal/day  2 g protein/kg/day (IBW) = 123 g protein Hold 20% lipid emulsion for first 7 days for ICU patients per ASPEN guidelines  Clinimix 5/15 at a goal rate of 100 ml/hr to provide:120 g/day protein, 1704 Kcal/day.  This meets 98% of protein and 120 % of kCal needs.  PLAN                                                                                                                         Now:  Magnesuim 1g IV bolus  KPhos 10 mMol IV bolus  At 1800 today:  Start Clinimix E 5/15 at 40 ml/hr.  HOLD  lipids for first 7 days, Start on 06/10/17  Plan to advance as tolerated to the goal rate.  TPN to contain standard multivitamins daily and trace elements only MWF - include both MVI and TE on Sunday 7/1 d/t prolonged NPO status.   Reduce IVF to 85 ml/hr.  Continue CBGs and resistant SSI q4h  TPN lab panels on Mondays & Thursdays.  F/u daily.  Gretta Arab PharmD, BCPS  Pager [phone] 06/03/2017 10:30 AM

## 2017-06-03 NOTE — Progress Notes (Signed)
PULMONARY / CRITICAL CARE MEDICINE   Name: Lori [name] MRN: [id number] DOB: Dec 09, 1950    ADMISSION DATE:  05/29/2017 CONSULTATION DATE:  05/31/2017  REFERRING MD:  Carolin Sicks  CHIEF COMPLAINT:  Hypotension  BRIEF: 66 y/o female with septic shock from an intra-abdominal abscess related to a small bowel obstruction from metastatic adenocarcinoma.   SUBJECTIVE:  Off pressors No complaints  VITAL SIGNS: BP 127/89   Pulse (!) 124   Temp 98.4 F (36.9 C) (Oral)   Resp (!) 22   Ht 5\' 7"  (1.702 m)   Wt 222 lb 10.6 oz (101 kg)   SpO2 95%   BMI 34.87 kg/m   HEMODYNAMICS:    VENTILATOR SETTINGS:    INTAKE / OUTPUT: I/O last 3 completed shifts: In: 3798.6 [I.V.:2838.6; IV Piggyback:960] Out: 2200 [Urine:1350; Emesis/NG output:850]  PHYSICAL EXAMINATION: Gen:      No acute distress HEENT:  EOMI, sclera anicteric Neck:     No masses; no thyromegaly Lungs:    Clear to auscultation bilaterally; normal respiratory effort CV:         nRegular rate and rhythm; no murmurs Abd:      Abd wound dressed with no discharge Ext:    No edema; adequate peripheral perfusion Skin:      Warm and dry; no rash Neuro: alert and oriented x 3 Psych: normal mood and affect  LABS:  BMET  Recent Labs Lab 06/01/17 0532 06/02/17 0428 06/03/17 0419  NA 133* 131* 135  K 4.2 3.6 3.5  CL 107 106 108  CO2 18* 20* 20*  BUN 32* 22* 19  CREATININE 1.30* 1.02* 0.93  GLUCOSE 293* 159* 184*    Electrolytes  Recent Labs Lab 05/30/17 0644  06/01/17 0532 06/02/17 0428 06/03/17 0419  CALCIUM 8.0*  < > 6.7* 6.9* 7.0*  MG 1.8  --   --   --  1.6*  PHOS  --   --   --   --  2.2*  < > = values in this interval not displayed.  CBC  Recent Labs Lab 06/01/17 0935 06/02/17 0428 06/03/17 0419  WBC 15.7* 13.6* 12.9*  HGB 11.2* 10.1* 9.1*  HCT 34.1* 30.6* 27.9*  PLT 334 256 237    Coag's No results for input(s): APTT, INR in the last 168 hours.  Sepsis Markers  Recent Labs Lab  05/31/17 1620 06/01/17 0935 06/01/17 1356  LATICACIDVEN 3.4* 2.6* 1.9    ABG No results for input(s): PHART, PCO2ART, PO2ART in the last 168 hours.  Liver Enzymes  Recent Labs Lab 05/29/17 0242  AST 21  ALT 23  ALKPHOS 68  BILITOT 0.4  ALBUMIN 2.8*    Cardiac Enzymes  Recent Labs Lab 06/01/17 0935 06/02/17 0428  TROPONINI 0.06* 0.04*    Glucose  Recent Labs Lab 06/02/17 1107 06/02/17 1606 06/02/17 1958 06/02/17 2322 06/03/17 0323 06/03/17 0805  GLUCAP 108* 105* 127* 115* 150* 167*    Imaging No results found.   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  Noted to have an abscess on ex-lap 6/27.  PCCM consulted for septic shock on 6/28.    ASSESSMENT / PLAN:  INFECTIOUS A:   Septic shock, intra-abdominal source P:   Continue vanco, zosyn, diflucan  CARDIOVASCULAR A:  Septic shock > appears volume replete now Pressors off 6/30 Elevated troponins. Likely demand P:  Tele monitoring  PULMONARY A: Pulmonary nodules P:   Wean O2 as tolerated Follow with Onc  RENAL A:   AKI > improving Hyponatremia P: Follow urine output and Cr  GASTROINTESTINAL A:   SBO, s/p ex-lap (abscess drainage, repair of small bowel perforation, repair of small bowel enterotomy) P:   NPO bowel res NG tube. Check Abd x ray for placement  HEMATOLOGIC A:   Anemia without bleeding Multiple forms of cancer P:  Follow CBC Transfuse for Hb <  7  ENDOCRINE A:   Hyperglycemia P:   SSI coverage  NEUROLOGIC A:   Abdominal pain post procedure Seizure disorder P:   PRN morphine Keppra  FAMILY  - Updates:  Patient updated 7/1 - Inter-disciplinary family meet or Palliative Care meeting due by: day 7  Stable for transfer from [place] MD [facility] Pager [phone] If no answer or after 3pm call: [phone] 06/03/2017, 9:43 AM

## 2017-06-04 ENCOUNTER — Inpatient Hospital Stay (HOSPITAL_COMMUNITY): Payer: Medicare Other

## 2017-06-04 DIAGNOSIS — C181 Malignant neoplasm of appendix: Secondary | ICD-10-CM

## 2017-06-04 DIAGNOSIS — Z8541 Personal history of malignant neoplasm of cervix uteri: Secondary | ICD-10-CM

## 2017-06-04 DIAGNOSIS — C7801 Secondary malignant neoplasm of right lung: Secondary | ICD-10-CM

## 2017-06-04 LAB — DIFFERENTIAL
Basophils Absolute: 0 10*3/uL (ref 0.0–0.1)
Basophils Relative: 0 %
EOS ABS: 0.1 10*3/uL (ref 0.0–0.7)
Eosinophils Relative: 1 %
LYMPHS ABS: 0.8 10*3/uL (ref 0.7–4.0)
Lymphocytes Relative: 8 %
Monocytes Absolute: 0.3 10*3/uL (ref 0.1–1.0)
Monocytes Relative: 3 %
NEUTROS ABS: 8.7 10*3/uL — AB (ref 1.7–7.7)
NEUTROS PCT: 88 %

## 2017-06-04 LAB — CBC
HCT: 27.1 % — ABNORMAL LOW (ref 36.0–46.0)
Hemoglobin: 9.1 g/dL — ABNORMAL LOW (ref 12.0–15.0)
MCH: 29.4 pg (ref 26.0–34.0)
MCHC: 33.6 g/dL (ref 30.0–36.0)
MCV: 87.4 fL (ref 78.0–100.0)
Platelets: 231 10*3/uL (ref 150–400)
RBC: 3.1 MIL/uL — AB (ref 3.87–5.11)
RDW: 15.1 % (ref 11.5–15.5)
WBC: 9.9 10*3/uL (ref 4.0–10.5)

## 2017-06-04 LAB — GLUCOSE, CAPILLARY
GLUCOSE-CAPILLARY: 156 mg/dL — AB (ref 65–99)
GLUCOSE-CAPILLARY: 161 mg/dL — AB (ref 65–99)
Glucose-Capillary: 159 mg/dL — ABNORMAL HIGH (ref 65–99)
Glucose-Capillary: 159 mg/dL — ABNORMAL HIGH (ref 65–99)
Glucose-Capillary: 145 mg/dL — ABNORMAL HIGH (ref 65–99)

## 2017-06-04 LAB — COMPREHENSIVE METABOLIC PANEL
ALT: 12 U/L — ABNORMAL LOW (ref 14–54)
AST: 19 U/L (ref 15–41)
Albumin: 1.3 g/dL — ABNORMAL LOW (ref 3.5–5.0)
Alkaline Phosphatase: 93 U/L (ref 38–126)
Anion gap: 5 (ref 5–15)
BILIRUBIN TOTAL: 0.7 mg/dL (ref 0.3–1.2)
BUN: 15 mg/dL (ref 6–20)
CO2: 20 mmol/L — ABNORMAL LOW (ref 22–32)
CREATININE: 0.84 mg/dL (ref 0.44–1.00)
Calcium: 7.1 mg/dL — ABNORMAL LOW (ref 8.9–10.3)
Chloride: 110 mmol/L (ref 101–111)
Glucose, Bld: 156 mg/dL — ABNORMAL HIGH (ref 65–99)
POTASSIUM: 3.1 mmol/L — AB (ref 3.5–5.1)
Sodium: 135 mmol/L (ref 135–145)
Total Protein: 4.1 g/dL — ABNORMAL LOW (ref 6.5–8.1)

## 2017-06-04 LAB — MAGNESIUM: MAGNESIUM: 1.7 mg/dL (ref 1.7–2.4)

## 2017-06-04 LAB — PHOSPHORUS: Phosphorus: 2.5 mg/dL (ref 2.5–4.6)

## 2017-06-04 LAB — PREALBUMIN

## 2017-06-04 LAB — TRIGLYCERIDES: TRIGLYCERIDES: 80 mg/dL (ref <150)

## 2017-06-04 MED ORDER — TRACE MINERALS CR-CU-MN-SE-ZN 10-1000-500-60 MCG/ML IV SOLN
INTRAVENOUS | Status: AC
  Administered 2017-06-04: 18:00:00 via INTRAVENOUS
  Filled 2017-06-04: qty 1680

## 2017-06-04 MED ORDER — POTASSIUM PHOSPHATES 15 MMOLE/5ML IV SOLN
10.0000 mmol | Freq: Once | INTRAVENOUS | Status: AC
  Administered 2017-06-04: 14:00:00 10 mmol via INTRAVENOUS
  Filled 2017-06-04: qty 3.33

## 2017-06-04 MED ORDER — CLONIDINE HCL 0.1 MG/24HR TD PTWK
0.1000 mg | MEDICATED_PATCH | TRANSDERMAL | Status: DC
  Administered 2017-06-04: 0.1 mg via TRANSDERMAL
  Filled 2017-06-04 (×2): qty 1

## 2017-06-04 MED ORDER — POLYETHYLENE GLYCOL 3350 17 G PO PACK
17.0000 g | PACK | Freq: Every day | ORAL | Status: DC
  Administered 2017-06-04: 11:00:00 17 g via ORAL
  Filled 2017-06-04 (×2): qty 1

## 2017-06-04 MED ORDER — POTASSIUM PHOSPHATES 15 MMOLE/5ML IV SOLN
10.0000 mmol | Freq: Once | INTRAVENOUS | Status: DC
  Filled 2017-06-04: qty 3.33

## 2017-06-04 MED ORDER — METOPROLOL TARTRATE 5 MG/5ML IV SOLN
5.0000 mg | INTRAVENOUS | Status: DC | PRN
  Administered 2017-06-04: 5 mg via INTRAVENOUS
  Filled 2017-06-04: qty 5

## 2017-06-04 MED ORDER — MAGNESIUM SULFATE IN D5W 1-5 GM/100ML-% IV SOLN
1.0000 g | Freq: Once | INTRAVENOUS | Status: AC
  Administered 2017-06-04: 1 g via INTRAVENOUS
  Filled 2017-06-04: qty 100

## 2017-06-04 MED ORDER — FAT EMULSION 20 % IV EMUL
240.0000 mL | INTRAVENOUS | Status: AC
  Administered 2017-06-04: 18:00:00 240 mL via INTRAVENOUS
  Filled 2017-06-04: qty 250

## 2017-06-04 MED ORDER — HYDRALAZINE HCL 20 MG/ML IJ SOLN
10.0000 mg | Freq: Four times a day (QID) | INTRAMUSCULAR | Status: DC | PRN
  Filled 2017-06-04: qty 0.5

## 2017-06-04 MED ORDER — HEPARIN SODIUM (PORCINE) 5000 UNIT/ML IJ SOLN
5000.0000 [IU] | Freq: Three times a day (TID) | INTRAMUSCULAR | Status: DC
  Administered 2017-06-04 – 2017-06-07 (×11): 5000 [IU] via SUBCUTANEOUS
  Filled 2017-06-04 (×11): qty 1

## 2017-06-04 MED ORDER — POTASSIUM CHLORIDE 10 MEQ/100ML IV SOLN
10.0000 meq | INTRAVENOUS | Status: AC
  Administered 2017-06-04 (×3): 10 meq via INTRAVENOUS
  Filled 2017-06-04 (×4): qty 100

## 2017-06-04 NOTE — Progress Notes (Signed)
Peripherally Inserted Central Catheter/Midline Placement  The IV Nurse has discussed with the patient and/or persons authorized to consent for the patient, the purpose of this procedure and the potential benefits and risks involved with this procedure.  The benefits include less needle sticks, lab draws from the catheter, and the patient may be discharged home with the catheter. Risks include, but not limited to, infection, bleeding, blood clot (thrombus formation), and puncture of an artery; nerve damage and irregular heartbeat and possibility to perform a PICC exchange if needed/ordered by physician.  Alternatives to this procedure were also discussed.  Bard Power PICC patient education guide, fact sheet on infection prevention and patient information card has been provided to patient /or left at bedside.    PICC/Midline Placement Documentation  PICC Double Lumen 54/56/25 PICC Left Basilic 52 cm 0 cm (Active)  Indication for Insertion or Continuance of Line Administration of hyperosmolar/irritating solutions (i.e. TPN, Vancomycin, etc.) 06/04/2017 12:10 PM  Exposed Catheter (cm) 0 cm 06/04/2017 12:10 PM  Site Assessment Clean;Dry;Intact 06/04/2017 12:10 PM  Lumen #1 Status Blood return noted;Flushed;Saline locked 06/04/2017 12:10 PM  Lumen #2 Status Blood return noted;Flushed;Saline locked 06/04/2017 12:10 PM  Dressing Type Transparent 06/04/2017 12:10 PM  Dressing Status Clean;Dry;Intact;Antimicrobial disc in place 06/04/2017 12:10 PM  Dressing Intervention New dressing 06/04/2017 12:10 PM  Dressing Change Due 06/11/17 06/04/2017 12:10 PM       Aldona Lento L 06/04/2017, 12:26 PM

## 2017-06-04 NOTE — Progress Notes (Signed)
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

## 2017-06-04 NOTE — Progress Notes (Addendum)
Initial Nutrition Assessment  DOCUMENTATION CODES:   Obesity unspecified  INTERVENTION:  - Continue TPN per Pharmacy. - Continue oral diet and diet advancement as medically feasible.  - RD will continue to monitor for needs.  NUTRITION DIAGNOSIS:   Inadequate oral intake related to inability to eat as evidenced by NPO status.  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  S/p exp lap on 6/27 --   peritoneal nodule bx with drainage of abdominal abscess, repair of small bowel perforation, repair of small bowel enterotomy, and enterocolostomy performed.  NG tube remains in place and on bowel rest.    Pt seen for new TPN. BMI indicates obesity. Medical dx and course outlined above and is from Pharmacy note yesterday AM. NGT remains in place this AM and is currently clamped. Diet advanced from NPO to CLD today at 0823 and pt reports 100% completion of chicken broth, coffee, jello, and grape juice and that she saved ginger ale to sip on throughout the AM. She denies abdominal pain or nausea prior to or following intakes of clears.  Pt reports that for >1 week PTA she was experiencing abdominal pain, no N/V, which caused decreased appetite and decreased desire to eat even favorite foods. Physical assessment shows no muscle and no fat wasting. Per chart review, pt has gained 15 lbs (6.9 kg) since admission; estimated needs based on admission weight of 207 lbs (94.1 kg). Pt unsure of UBW or weight hx recently. Per chart review, she lost 6 lbs (2.8% body weight) in the 3 weeks PTA which is not significant for time frame. Unable to state malnutrition based on available information  and ASPEN guidelines.   She is currently receiving Clinimix E 5/15 @ 40 mL/hr without ILE via R-sided Port-a-cath. This regimen is providing 682 kcal and 48 grams of protein. Recommend goal: Clinimix E 5/15 @ 83 mL/hr with 20% ILE @ 12 mL/hr x12 hours to provide 1894 kcal (91% minimum estimated kcal need) and 100 grams of protein (87% minimum estimated protein need).  Palliative consult in this AM for GOC.   Medications reviewed; sliding scale Novolog, 1 g IV Mg sulfate x1 run today and x1 run yesterday, 40 mg IV Protonix/day, 1 packet Miralax/day, 10 mmol IV KPhos x1 run yesterday.  Labs reviewed; CBGs: 156 and 159 mg/dL this AM, K: 3.1 mmol/L, Ca: 7.1 mg/dL.  IVF: D5-NS @ 85 mL/hr (347 kcal from dextrose).    Diet Order:  .TPN (CLINIMIX-E) Adult Diet clear liquid Room service appropriate? Yes; Fluid consistency: Thin  Skin:  Wound (see comment) (Abdominal incision from 05/30/17)  Last BM:  7/1  Height:   Ht Readings from Last 1 Encounters:  05/31/17 5\' 7"  (1.702 m)    Weight:   Wt Readings from Last 1 Encounters:  06/01/17 222 lb 10.6 oz (101 kg)    Ideal Body Weight:  61.36 kg  BMI:  Body mass index is 34.87 kg/m.  Estimated Nutritional Needs:   Kcal:  2070-2350 (22-25 kcal/kg)  Protein:  115-130 (1.2-1.4 grams/kg)  Fluid:  >/= 2.1 L/day  EDUCATION NEEDS:   No education needs identified at this time    Jarome Matin,  MS, RD, LDN, CNSC Inpatient Clinical Dietitian Pager # [phone] After hours/weekend pager # [phone]  

## 2017-06-04 NOTE — Progress Notes (Signed)
Lori [name]   DOB:Aug 27, 1951   PN#:[id number]   RZN#:[id number]   Reason for consult: I was asked to meet with Lori [name] by her hospitalist who felt the patient did not have sufficient understanding of her medical situation and requested a review. I reviewed the chart and met with the patient and her grandson for 40 minutes going over her situation and clarifying her choices-- see PLAN below for conclusions  Subjective:  Lori [name] tells me she currently has no pain, that she is tolerating clear liquids (started today) well so far and that she is making some formed stools. 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 She says an MD came in today and told her she was going to die soon from this cancer. She says she has a better [heavenly] doctor who can pull her through. She expects once they finish treating the infection she can consider treatment of the cancer.She is clear she wants her doctors to "keep me alive as long as you can" and specifically says she would want to be on a breathing machine if necessary. She tells me she would like to continue her cancer care at [facility] with Drs Maylon Peppers and PA Kefalas.    Objective: middle aged African American woman examined in bed  Vitals:   06/04/17 0415 06/04/17 1322  BP: (!) 158/100 139/84  Pulse: (!) 119 (!) 118  Resp: 20 20  Temp: 98.4 F (36.9 C) 98.4 F (36.9 C)    Body mass index is 34.87 kg/m.  Intake/Output Summary (Last 24 hours) at 06/04/17 1838 Last data filed at 06/04/17 1814  Gross per 24 hour  Intake          4136.58 ml  Output             1500 ml  Net          2636.58 ml     No cervical or supraclavicular adenopathy  Lungs no rales or wheezes--auscultated anterolaterally  Heart regular rate and rhythm  Abdomen bandaged over  Neuro nonfocal  Breast exam:  deferred  CBG (last 3)   Recent Labs  06/04/17 0731 06/04/17 1237 06/04/17 1549  GLUCAP 159* 159* 145*     Labs:  Lab Results  Component Value Date   WBC 9.9 06/04/2017   HGB 9.1 (L) 06/04/2017   HCT 27.1 (L) 06/04/2017   MCV 87.4 06/04/2017   PLT 231 06/04/2017   NEUTROABS 8.7 (H) 06/04/2017    @LASTCHEMISTRY @  Urine Studies No results for input(s): UHGB, CRYS in the last 72 hours.  Invalid input(s): UACOL, UAPR, USPG, UPH, UTP, UGL, UKET, UBIL, UNIT, UROB, [place], UEPI, UWBC, URBC, [place], [place], [place], [place]  Basic Metabolic Panel:  Recent Labs Lab 05/30/17 [phone] 05/31/17 0402 06/01/17 0532 06/02/17 0428 06/03/17 0419 06/04/17 0314  NA 136 137 133* 131* 135 135  K 4.1 4.5 4.2 3.6 3.5 3.1*  CL 102 110 107 106 108 110  CO2 24 17* 18* 20* 20* 20*  GLUCOSE 96 249* 293* 159* 184* 156*  BUN 21* 26* 32* 22* 19 15  CREATININE 0.88 1.54* 1.30* 1.02* 0.93 0.84  CALCIUM 8.0* 6.8* 6.7* 6.9* 7.0* 7.1*  MG 1.8  --   --   --  1.6* 1.7  PHOS  --   --   --   --  2.2* 2.5   GFR Estimated Creatinine Clearance: 80.5 mL/min (by C-G formula based on SCr of 0.84 mg/dL). Liver Function Tests:  Recent Labs Lab 05/29/17 0242 06/04/17 0314  AST 21 19  ALT 23 12*  ALKPHOS 68 93  BILITOT 0.4 0.7  PROT 5.8* 4.1*  ALBUMIN 2.8* 1.3*   No results for input(s): LIPASE, AMYLASE in the last 168 hours. No results for input(s): AMMONIA in the last 168 hours. Coagulation profile No results for input(s): INR, PROTIME in the last 168 hours.  CBC:  Recent Labs Lab 05/29/17 0242  05/31/17 0402 06/01/17 0935 06/02/17 0428 06/03/17 0419 06/04/17 0314  WBC 8.8  < > 8.5 15.7* 13.6* 12.9* 9.9  NEUTROABS 6.4  --   --   --   --   --  8.7*  HGB 11.3*  < > 12.4 11.2* 10.1* 9.1* 9.1*  HCT 33.7*  < > 38.6 34.1* 30.6* 27.9* 27.1*  MCV 86.2  < > 89.1 89.3 88.2 86.9 87.4  PLT 385  < > 334 334 256 237 231  < > = values in this interval not displayed. Cardiac Enzymes:  Recent Labs Lab  06/01/17 0935 06/02/17 0428  TROPONINI 0.06* 0.04*   BNP: Invalid input(s): POCBNP CBG:  Recent Labs Lab 06/03/17 2359 06/04/17 0407 06/04/17 0731 06/04/17 1237 06/04/17 1549  GLUCAP 150* 156* 159* 159* 145*   D-Dimer No results for input(s): DDIMER in the last 72 hours. Hgb A1c No results for input(s): HGBA1C in the last 72 hours. Lipid Profile  Recent Labs  06/04/17 0314  TRIG 80   Thyroid function studies No results for input(s): TSH, T4TOTAL, T3FREE, THYROIDAB in the last 72 hours.  Invalid input(s): FREET3 Anemia work up No results for input(s): VITAMINB12, FOLATE, FERRITIN, TIBC, IRON, RETICCTPCT in the last 72 hours. Microbiology Recent Results (from the past 240 hour(s))  Urine Culture     Status: None   Collection Time: 05/31/17 10:50 AM  Result Value Ref Range Status   Specimen Description URINE, CATHETERIZED  Final   Special Requests NONE  Final   Culture   Final    NO GROWTH Performed at [facility], [address] [place], [place] [address]    Report Status 06/01/2017 FINAL  Final  Culture, blood (Routine X 2) w Reflex to ID Panel     Status: None (Preliminary result)   Collection Time: 05/31/17 12:10 PM  Result Value Ref Range Status   Specimen Description BLOOD RIGHT HAND  Final   Special Requests IN PEDIATRIC BOTTLE Blood Culture adequate volume  Final   Culture   Final    NO GROWTH 4 DAYS Performed at [facility], [place] [address] [place], [place] [address]    Report Status PENDING  Incomplete  Culture, blood (Routine X 2) w Reflex to ID Panel     Status: None (Preliminary result)   Collection Time: 05/31/17  2:34 PM  Result Value Ref Range Status   Specimen Description BLOOD RIGHT HAND  Final   Special Requests IN PEDIATRIC BOTTLE Blood Culture adequate volume  Final   Culture   Final    NO GROWTH 4 DAYS Performed at [facility], [place] [address] [place], [place] [address]    Report Status PENDING  Incomplete       Studies:  Dg Chest Port 1 View  Result Date: 06/04/2017 CLINICAL DATA:  66 year old female with respiratory failure. Subsequent encounter. EXAM: PORTABLE CHEST 1 VIEW COMPARISON:  05/31/2017. FINDINGS: Right central line tip proximal right atrium level. Nasogastric tube courses below the diaphragm. Tip is not included on the present exam. Cardiomegaly. Pulmonary vascular congestion/mild pulmonary edema. Basilar atelectasis suspected. Infiltrate felt to be less likely consideration. Small pleural effusions suspected. Calcified aorta. No pneumothorax. IMPRESSION: Pulmonary vascular congestion/ mild pulmonary edema. Small pleural effusions suspected. Basilar subsegmental atelectasis. Cardiomegaly. Electronically Signed   By: Genia Del M.D.   On: 06/04/2017 07:02   Dg Abd Portable 1v  Result Date: 06/03/2017 CLINICAL DATA:  Nasogastric tube placement EXAM: PORTABLE ABDOMEN - 1 VIEW COMPARISON:  Portable exam 0943 hours compared to 05/29/2017 FINDINGS: Tip of nasogastric tube projects over proximal stomach. Gas and stool in transverse colon. Nonobstructive bowel gas pattern. No acute osseous findings. IMPRESSION: Tip of nasogastric tube projects over proximal stomach. Electronically Signed   By: Lavonia Dana M.D.   On: 06/03/2017 10:19    Assessment: 66 y.o. 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, with no definitive evidence of recurrence  (2) RUL pulmonary nodule(s) noted on PET scan February 2018, presumably metastases   (3) s/p laparoscopic appendectomy 04/19/2016 for a T4a mucinous appendiceal adenocarcinoma--pathologically distinct from her prior cervical adenocarcinoma--with positive margins, not treated to date  (4) recurrent SBO, s/p  laparotomy 05/30/2017 for drainage of intra-abdominal abscess with enterocolostomy (mid ileum to mid transverse colon); surgeon describes multiple peritoneal nodules throughout abdomen    Plan:  Lori [name] is recovering from her septic episode and appears to be benefiting from the surgical bypass of her SBO. She is interested in receiving chemotherapy or other appropriate therapy for her stage IV appendiceal adenocarcinoma and identifies Dr [name] as her primary oncologist. She understands her current cancer is not curable but she is interested in receiving any treatment that could prolong her life, even if it has some significant side effects. She understands at present she is not strong enough to undergo chemotherapy but she is expecting to improve.  With that in mind, I would think once she is more fully recovered from the recent surgery she could be transferred back to [facility] to complete her rehabilitation and re-establish contact with Dr [name], who can discuss specific treatment plans with the patient.  We discussed the fact that things may "go the wrong way." Specifically she might develop some problem that makes her incapable of making medical decisions. She tells me she does not have a HCPOA but is thinking of naming her older son Lori [name] as her HCPOA. He can be reached at [phone].   I will place a SW consult to facilitate the patient completing advanced directives, namely HCPOA. At this point she is very insistent that she wants "everything done," including intubation and CPR if necessary.  Will follow peripherally. Please let me know if I can be of further help.  Chauncey Cruel, MD 06/04/2017  6:38 PM Medical Oncology and Hematology [facility] [address] [place], [place] [address] Tel. [phone]    Fax. [phone]

## 2017-06-04 NOTE — Progress Notes (Signed)
@IPLOG @        PROGRESS NOTE                                                                                                                                                                                                             Patient Demographics:    Lori [name], is a 66 y.o. female, DOB - 1951/09/19, UVO:[id number]  Admit date - 05/29/2017   Admitting Physician Rise Patience, MD  Outpatient Primary MD for the patient is [name], MD  LOS - 6  Chief Complaint  Patient presents with  . 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 She was admitted to the hospital on 05/29/2017 with septic shock arising from intra-abdominal abscess along with small bowel obstruction secondary to metastatic adenocarcinoma of her appendix, she was under the care of critical care in ICU, she was seen by general surgery and oncology, she underwent expiratory laparotomy with drainage of the intra-abdominal abscess, repair of small bowel perforation with enterocolostomy. She was transferred to my care on 06/04/2017 on TNA.   Past oncological history   Mucinous Adenocarcinoma of appendix, recurrent metastatic disease (peritoneal carcinomatosis), Stage IV (pT4a Nx M1):  Surgery (Dr Deland Pretty), 04/19/16: Laparoscopic appendectomy. R1 resection, No systemic therapy administered after case review with the GI Tumor Board Caroleen Hamman, PA-C), Surgery (Dr Doralee Albino), 05/30/17: Diagnostic laparoscopy with peritoneal no acute biopsy. Exploratory laparotomy, drainage of abdominal abscess, repair of small bowel perforation, repair of small bowel enterotomy, enterocolostomy.    Carcinoma of the uterine cervix, FIGO Stage IIB, HPV p16-positive: Clinical Course: -Initial Dx, 04/05/15:  Endometrial biopsy -- Adenocarcinoma, HPV 16 positive. Recurrence, 01/12/16: PET-CT -- Single hypermetabolic retroperitoneal left periaortic lymph node, worrisome for metastatic disease. No additional areas of abnormal hypermetabolism in the neck, chest, abdomen or pelvis. PET-CT, 04/04/16: Interval decrease in FDG uptake associated with retroperitoneal hypermetabolic lymph node reported on previous exam. Findings compatible with response to therapy. PET-CT, 02/02/17: New hypermetabolic 0.7 cm medial right upper lobe pulmonary nodule. Slight growth of additional tiny 0.3 cm apical right upper lobe pulmonary nodule. Findings are suspicious for pulmonary metastases. No additional sites of hypermetabolic metastatic disease. No evidence of hypermetabolic recurrence in the cervix or appendectomy bed.     Subjective:    Lori [name] today has, No headache, No chest  pain, No abdominal pain - No Nausea, No new weakness tingling or numbness, No Cough - SOB.     Assessment  & Plan :     72.  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   She was admitted to the hospital on 05/29/2017 with septic shock arising from intra-abdominal abscess along with small bowel obstruction secondary to metastatic adenocarcinoma of her appendix, she was under the care of critical care in ICU, she was seen by general surgery and oncology, she underwent expiratory laparotomy with drainage of the intra-abdominal abscess, repair of small bowel perforation with enterocolostomy. She was transferred to my care on 06/04/2017 on TNA. Sepsis and shock physiology have resolved.  Discussed the case with general surgeon Dr. Hassell Done, long-term prognosis is poor, also requested oncology to provide input into long-term prognosis, we will stop vancomycin,  continue Zosyn & Diflucan  for now, NG return has slowed down and she had a bowel movement on 06/03/2017, Will start clear liquids and NG, continue TNA, we will also involve palliative care for full goals of care.   2. Anemia of chronic disease. Monitor no need for transfusion.  3. Hypokalemia. Replaced.  4. Seizure disorder. On IV Keppra.  5. Hypertension. In poor control added Catapres patch along with as needed IV Lopressor and Norvasc, currently nothing by mouth.  6. GERD. An IV PPI.     Diet : .TPN (CLINIMIX-E) Adult Diet clear liquid Room service appropriate? Yes; Fluid consistency: Thin    Family Communication  :  None  Code Status :  Full  Disposition Plan  :  TBD  Consults  :  PCCM,CCS, Onc, Pall care  Procedures  :     DVT Prophylaxis  :   Heparin    Lab Results  Component Value Date   PLT 231 06/04/2017    Inpatient Medications  Scheduled Meds: . aspirin  81 mg Oral Daily  . chlorhexidine  15 mL Mouth Rinse BID  . chlorhexidine  15 mL Mouth Rinse BID  . Chlorhexidine Gluconate Cloth  6 each Topical Q0600  . insulin aspart  0-20 Units Subcutaneous Q4H  . mouth rinse  15 mL Mouth Rinse q12n4p  . mouth rinse  15 mL Mouth Rinse q12n4p  . pantoprazole (PROTONIX) IV  40 mg Intravenous Q24H  . polyethylene glycol  17 g Oral Daily   Continuous Infusions: . Marland KitchenTPN (CLINIMIX-E) Adult 40 mL/hr at 06/03/17 1753  . dextrose 5 % and 0.9% NaCl 85 mL/hr at 06/04/17 0942  . fluconazole (DIFLUCAN) IV Stopped (06/03/17 1225)  . levETIRAcetam Stopped (06/03/17 2144)  . magnesium sulfate 1 - 4 g bolus IVPB    . piperacillin-tazobactam Stopped (06/04/17 0948)  . sodium chloride    . vancomycin Stopped (06/04/17 0132)   PRN Meds:.acetaminophen, diphenhydrAMINE, hydrALAZINE, metoprolol tartrate, morphine injection, ondansetron **OR** ondansetron (ZOFRAN) IV, sodium chloride flush  Antibiotics  :    Anti-infectives    Start     Dose/Rate Route Frequency Ordered Stop    06/03/17 1200  vancomycin (VANCOCIN) IVPB 1000 mg/200 mL premix     1,000 mg 200 mL/hr over 60 Minutes Intravenous Every 12 hours 06/03/17 1141     06/02/17 1000  fluconazole (DIFLUCAN) IVPB 400 mg     400 mg 100 mL/hr over 120 Minutes Intravenous Every 24 hours 06/01/17 1017  06/01/17 1100  fluconazole (DIFLUCAN) IVPB 800 mg     800 mg 100 mL/hr over 240 Minutes Intravenous  Once 06/01/17 1017 06/01/17 1544   06/01/17 1100  vancomycin (VANCOCIN) IVPB 750 mg/150 ml premix  Status:  Discontinued     750 mg 150 mL/hr over 60 Minutes Intravenous Every 12 hours 06/01/17 1051 06/03/17 1141   05/31/17 1145  vancomycin (VANCOCIN) IVPB 750 mg/150 ml premix     750 mg 150 mL/hr over 60 Minutes Intravenous NOW 05/31/17 1130 05/31/17 1258   05/31/17 0930  vancomycin (VANCOCIN) IVPB 750 mg/150 ml premix  Status:  Discontinued     750 mg 150 mL/hr over 60 Minutes Intravenous Every 12 hours 05/31/17 0911 05/31/17 1134   05/30/17 1800  piperacillin-tazobactam (ZOSYN) IVPB 3.375 g     3.375 g 12.5 mL/hr over 240 Minutes Intravenous Every 8 hours 05/30/17 1734 06/06/17 2159   05/30/17 1159  ceFAZolin (ANCEF) 2-4 GM/100ML-% IVPB    Comments:  Bridget Hartshorn   : cabinet override      05/30/17 1159 05/30/17 1259   05/30/17 0600  ceFAZolin (ANCEF) IVPB 2g/100 mL premix     2 g 200 mL/hr over 30 Minutes Intravenous On call to O.R. 05/29/17 1430 05/30/17 1259         Objective:   Vitals:   06/03/17 1500 06/03/17 2010 06/04/17 0000 06/04/17 0415  BP: 136/78 (!) 157/102 (!) 143/97 (!) 158/100  Pulse: 100 (!) 125 (!) 126 (!) 119  Resp: (!) 22 (!) 24 20 20   Temp: 97.3 F (36.3 C) 98.1 F (36.7 C) 98.4 F (36.9 C) 98.4 F (36.9 C)  TempSrc: Oral Oral Oral Oral  SpO2: 99% 96% 97% 97%  Weight:      Height:        Wt Readings from Last 3 Encounters:  06/01/17 101 kg (222 lb 10.6 oz)  05/17/17 95.5 kg (210 lb 8.6 oz)  04/27/17 98 kg (216 lb)     Intake/Output Summary (Last 24 hours) at  06/04/17 1050 Last data filed at 06/04/17 0418  Gross per 24 hour  Intake             1233 ml  Output             1300 ml  Net              -67 ml     Physical Exam  Awake Alert, Oriented X 3, No new F.N deficits, Normal affect [place].AT,PERRAL Supple Neck,No JVD, No cervical lymphadenopathy appriciated.  Symmetrical Chest wall movement, Good air movement bilaterally, CTAB RRR,No Gallops,Rubs or new Murmurs, No Parasternal Heave +ve B.Sounds, Abd Soft, Midline abdominal incision under bandage, No organomegaly appriciated, No rebound - guarding or rigidity. No Cyanosis, Clubbing or edema, No new Rash or bruise      Data Review:    CBC  Recent Labs Lab 05/29/17 0242  05/31/17 0402 06/01/17 0935 06/02/17 0428 06/03/17 0419 06/04/17 0314  WBC 8.8  < > 8.5 15.7* 13.6* 12.9* 9.9  HGB 11.3*  < > 12.4 11.2* 10.1* 9.1* 9.1*  HCT 33.7*  < > 38.6 34.1* 30.6* 27.9* 27.1*  PLT 385  < > 334 334 256 237 231  MCV 86.2  < > 89.1 89.3 88.2 86.9 87.4  MCH 28.9  < > 28.6 29.3 29.1 28.3 29.4  MCHC 33.5  < > 32.1 32.8 33.0 32.6 33.6  RDW 14.3  < > 14.2 14.8 14.8 14.7 15.1  LYMPHSABS  1.3  --   --   --   --   --  0.8  MONOABS 0.9  --   --   --   --   --  0.3  EOSABS 0.1  --   --   --   --   --  0.1  BASOSABS 0.0  --   --   --   --   --  0.0  < > = values in this interval not displayed.  Chemistries   Recent Labs Lab 05/29/17 0242  05/30/17 1308 05/31/17 0402 06/01/17 0532 06/02/17 0428 06/03/17 0419 06/04/17 0314  NA 129*  < > 136 137 133* 131* 135 135  K 3.6  < > 4.1 4.5 4.2 3.6 3.5 3.1*  CL 94*  < > 102 110 107 106 108 110  CO2 25  < > 24 17* 18* 20* 20* 20*  GLUCOSE 182*  < > 96 249* 293* 159* 184* 156*  BUN 26*  < > 21* 26* 32* 22* 19 15  CREATININE 0.86  < > 0.88 1.54* 1.30* 1.02* 0.93 0.84  CALCIUM 8.3*  < > 8.0* 6.8* 6.7* 6.9* 7.0* 7.1*  MG  --   --  1.8  --   --   --  1.6* 1.7  AST 21  --   --   --   --   --   --  19  ALT 23  --   --   --   --   --   --  12*  ALKPHOS  68  --   --   --   --   --   --  93  BILITOT 0.4  --   --   --   --   --   --  0.7  < > = values in this interval not displayed. ------------------------------------------------------------------------------------------------------------------  Recent Labs  06/04/17 0314  TRIG 80    Lab Results  Component Value Date   HGBA1C 6.6 (H) 04/14/2016   ------------------------------------------------------------------------------------------------------------------ No results for input(s): TSH, T4TOTAL, T3FREE, THYROIDAB in the last 72 hours.  Invalid input(s): FREET3 ------------------------------------------------------------------------------------------------------------------ No results for input(s): VITAMINB12, FOLATE, FERRITIN, TIBC, IRON, RETICCTPCT in the last 72 hours.  Coagulation profile No results for input(s): INR, PROTIME in the last 168 hours.  No results for input(s): DDIMER in the last 72 hours.  Cardiac Enzymes  Recent Labs Lab 06/01/17 0935 06/02/17 0428  TROPONINI 0.06* 0.04*   ------------------------------------------------------------------------------------------------------------------ No results found for: BNP  Micro Results Recent Results (from the past 240 hour(s))  Urine Culture     Status: None   Collection Time: 05/31/17 10:50 AM  Result Value Ref Range Status   Specimen Description URINE, CATHETERIZED  Final   Special Requests NONE  Final   Culture   Final    NO GROWTH Performed at [facility], [place] [address], [place], [place] [address]    Report Status 06/01/2017 FINAL  Final  Culture, blood (Routine X 2) w Reflex to ID Panel     Status: None (Preliminary result)   Collection Time: 05/31/17 12:10 PM  Result Value Ref Range Status   Specimen Description BLOOD RIGHT HAND  Final   Special Requests IN PEDIATRIC BOTTLE Blood Culture adequate volume  Final   Culture   Final    NO GROWTH 3 DAYS Performed at [facility],  [place] [address], [place], [place] [address]    Report Status PENDING  Incomplete  Culture, blood (Routine X 2) w Reflex to ID Panel     Status: None (Preliminary result)   Collection Time: 05/31/17  2:34 PM  Result Value Ref Range Status   Specimen Description BLOOD RIGHT HAND  Final   Special Requests IN PEDIATRIC BOTTLE Blood Culture adequate volume  Final   Culture   Final    NO GROWTH 3 DAYS Performed at [facility], [place] [address] [place], [place] [address]    Report Status PENDING  Incomplete    Radiology Reports Ct Abdomen Pelvis Wo Contrast  Result Date: 05/15/2017 CLINICAL DATA:  Abdominal pain and constipation EXAM: CT ABDOMEN AND PELVIS WITHOUT CONTRAST TECHNIQUE: Multidetector CT imaging of the abdomen and pelvis was performed following the standard protocol without IV contrast. COMPARISON:  05/05/2017, 05/03/2017 FINDINGS: Lower chest: Lung bases demonstrate no acute consolidation or effusion. There is cardiomegaly. Small pericardial effusion. Coronary artery calcification. Hiatal hernia with distal esophageal thickening. Hepatobiliary: No calcified gallstones. No biliary dilatation or focal hepatic abnormality. Pancreas: Unremarkable. No pancreatic ductal dilatation or surrounding inflammatory changes. Spleen: Normal in size without focal abnormality. Adrenals/Urinary Tract: Adrenal glands are within normal limits. Atrophic and scarred left kidney. No hydronephrosis. The bladder is unremarkable. Stomach/Bowel: Marked enlargement of the stomach. Multiple loops of dilated, fluid-filled small bowel with slight increased distension since the previous examination. Findings consistent with mechanical bowel obstruction. No colon wall thickening. Prior appendectomy. Vascular/Lymphatic: Aortic atherosclerosis. No enlarged abdominal or pelvic lymph nodes. Reproductive: Suspect that there is thickening of the endometrial stripe, measuring 19 mm on sagittal views. No adnexal masses. Other: New  or increased free fluid in the pelvis and around the liver. No free air. Small pockets of soft tissue gas in the left gluteal region. Musculoskeletal: No acute or suspicious bone lesion. IMPRESSION: 1. Findings again consistent with mechanical small bowel obstruction, the stomach is markedly enlarged. Overall increased dilatation of the small bowel loops since the previous study, consistent with worsening obstruction. No free air, however increasing free fluid in the abdomen and pelvis. 2. Cardiomegaly with small pericardial effusion. 3. Suspect thickening of the endometrial stripe, this may be correlated with pelvic ultrasound 4. Electronically Signed   By: Donavan Foil M.D.   On: 05/15/2017 02:44   Dg Chest 1 View  Result Date: 05/15/2017 CLINICAL DATA:  Assess position of right-sided chest port. Initial encounter. EXAM: CHEST 1 VIEW COMPARISON:  PET/CT performed 02/02/2017 FINDINGS: The patient's right-sided chest port is noted ending about the distal SVC. An enteric tube is noted extending below the diaphragm. Mild peribronchial thickening is noted. No pleural effusion or pneumothorax is seen. The cardiomediastinal silhouette is borderline normal in size. No acute osseous abnormalities are identified. IMPRESSION: 1. Right-sided chest port noted ending about the distal SVC. 2. Mild peribronchial thickening noted.  Lungs otherwise clear. Electronically Signed   By: Garald Balding M.D.   On: 05/15/2017 03:41   Dg Abd 1 View  Result Date: 05/29/2017 CLINICAL DATA:  66 year old female with nasogastric tube placement. Subsequent encounter. EXAM: ABDOMEN - 1 VIEW COMPARISON:  05/29/2017. FINDINGS: Nasogastric tube tip and side hole gastric body level. Gas distended small bowel loops with thickened folds consistent with small bowel obstruction once again noted. The possibility of free intraperitoneal air cannot be assessed on a supine view. IMPRESSION: Nasogastric tube tip and side hole gastric body level. Gas  distended small bowel loops with thickened folds consistent with small bowel obstruction once again noted. Electronically Signed   By: Remo Lipps  Jeannine Kitten M.D.   On: 05/29/2017 07:06   Dg Abd 1 View  Result Date: 05/17/2017 CLINICAL DATA:  Follow-up of small bowel obstruction EXAM: ABDOMEN - 1 VIEW COMPARISON:  Abdominal radiograph of May 16, 2017 FINDINGS: There remain loops of moderately distended gas-filled small bowel in the mid abdomen. There is some gas within the ascending and transverse colon. No free extraluminal gas collections are observed. There is a tiny amount of gas in the rectum. The esophagogastric tube has been removed. IMPRESSION: Findings compatible with a fairly high-grade mid to distal small bowel obstruction. No evidence of perforation. Electronically Signed   By: David  Martinique M.D.   On: 05/17/2017 08:38   Dg Abd 1 View  Result Date: 05/16/2017 CLINICAL DATA:  NG tube placement. EXAM: ABDOMEN - 1 VIEW COMPARISON:  05/15/2017 FINDINGS: An NG tube is identified with tip overlying the proximal stomach. Dilated small bowel loops are again noted. IMPRESSION: NG tube with tip overlying the proximal stomach. Unchanged dilated small bowel loops. Electronically Signed   By: Margarette Canada M.D.   On: 05/16/2017 19:03   Dg Abdomen 1 View  Result Date: 05/15/2017 CLINICAL DATA:  NG tube placement EXAM: ABDOMEN - 1 VIEW COMPARISON:  05/15/2017 FINDINGS: Esophageal tube tip overlies the mid stomach. Multiple loops of dilated small bowel consistent with a bowel obstruction. IMPRESSION: Esophageal tube tip overlies the mid stomach. Electronically Signed   By: Donavan Foil M.D.   On: 05/15/2017 03:36   Dg Chest Port 1 View  Result Date: 06/04/2017 CLINICAL DATA:  66 year old female with respiratory failure. Subsequent encounter. EXAM: PORTABLE CHEST 1 VIEW COMPARISON:  05/31/2017. FINDINGS: Right central line tip proximal right atrium level. Nasogastric tube courses below the diaphragm. Tip is not  included on the present exam. Cardiomegaly. Pulmonary vascular congestion/mild pulmonary edema. Basilar atelectasis suspected. Infiltrate felt to be less likely consideration. Small pleural effusions suspected. Calcified aorta. No pneumothorax. IMPRESSION: Pulmonary vascular congestion/ mild pulmonary edema. Small pleural effusions suspected. Basilar subsegmental atelectasis. Cardiomegaly. Electronically Signed   By: Genia Del M.D.   On: 06/04/2017 07:02   Dg Chest Port 1 View  Result Date: 05/31/2017 CLINICAL DATA:  Persistent hypotension EXAM: PORTABLE CHEST 1 VIEW COMPARISON:  05/29/2017 FINDINGS: NG tube tip is in the stomach. There are low lung volumes with bibasilar atelectasis. Mild cardiomegaly. No effusions or acute bony abnormality. IMPRESSION: Low lung volumes, bibasilar atelectasis. Electronically Signed   By: Rolm Baptise M.D.   On: 05/31/2017 09:40   Dg Abd Acute W/chest  Result Date: 05/29/2017 CLINICAL DATA:  History of cervical cancer, abdominal distention EXAM: DG ABDOMEN ACUTE W/ 1V CHEST COMPARISON:  05/17/2017, 05/15/2017 FINDINGS: Right-sided central venous port tip overlies the distal SVC. Small focus of atelectasis at the left base. No pleural effusion. Stable enlarged cardiomediastinal silhouette. Supine and upright views of the abdomen demonstrate multiple dilated loops of small bowel measuring up to 5.2 cm consistent with mechanical small bowel obstruction. No obvious free air on decubitus view. IMPRESSION: 1. No radiographic evidence for acute cardiopulmonary abnormality 2. Multiple loops of dilated small bowel, consistent with small bowel obstruction, this appears worsened radiographically compared with 05/17/2017. Electronically Signed   By: Donavan Foil M.D.   On: 05/29/2017 03:31   Dg Abd Portable 1v  Result Date: 06/03/2017 CLINICAL DATA:  Nasogastric tube placement EXAM: PORTABLE ABDOMEN - 1 VIEW COMPARISON:  Portable exam 0943 hours compared to 05/29/2017 FINDINGS:  Tip of nasogastric tube projects over proximal stomach. Gas and stool in  transverse colon. Nonobstructive bowel gas pattern. No acute osseous findings. IMPRESSION: Tip of nasogastric tube projects over proximal stomach. Electronically Signed   By: Lavonia Dana M.D.   On: 06/03/2017 10:19    Time Spent in minutes  30   Lala Lund M.D on 06/04/2017 at 10:50 AM  Between 7am to 7pm - Pager - [phone] ( page via [email or web address], text pages only, please mention full 10 digit call back number). After 7pm go to [email or web address] - password [facility]

## 2017-06-04 NOTE — Progress Notes (Signed)
[facility] Progress Note  5 Days Post-Op  Subjective: CC:  Denies abdominal pain at rest but states that she cannot move 2/2 abdominal pain. +flatus and had a small BM last night. NG is clamped and ginger ale at bedside. Denies nausea or vomiting. States that prior to hospital admission she was walking independently, however according to nursing staff she is require max assist for rolling in bed.  Pt getting PICC today. Objective: Vital signs in last 24 hours: Temp:  [97.3 F (36.3 C)-98.4 F (36.9 C)] 98.4 F (36.9 C) (07/02 0415) Pulse Rate:  [100-127] 119 (07/02 0415) Resp:  [20-32] 20 (07/02 0415) BP: (126-158)/(78-102) 158/100 (07/02 0415) SpO2:  [96 %-99 %] 97 % (07/02 0415) Last BM Date:  (prior to admission)  Intake/Output from previous day: 07/01 0701 - 07/02 0700 In: 3233 [I.V.:2783; IV Piggyback:450] Out: 1500 [Urine:1350; Emesis/NG output:150] Intake/Output this shift: No intake/output data recorded.  PE: Gen:  Alert, NAD, pleasant Card:  Tachycardic, pedal pulses palpable Pulm:  Normal effort, clear to auscultation bilaterally Abd: Soft, non-tender, distended, bowel sounds present, midline incision as below:    Skin: warm and dry, no rashes  Psych: A&Ox3   Lab Results:   Recent Labs  06/03/17 0419 06/04/17 0314  WBC 12.9* 9.9  HGB 9.1* 9.1*  HCT 27.9* 27.1*  PLT 237 231   BMET  Recent Labs  06/03/17 0419 06/04/17 0314  NA 135 135  K 3.5 3.1*  CL 108 110  CO2 20* 20*  GLUCOSE 184* 156*  BUN 19 15  CREATININE 0.93 0.84  CALCIUM 7.0* 7.1*   PT/INR No results for input(s): LABPROT, INR in the last 72 hours. CMP     Component Value Date/Time   NA 135 06/04/2017 0314   K 3.1 (L) 06/04/2017 0314   CL 110 06/04/2017 0314   CO2 20 (L) 06/04/2017 0314   GLUCOSE 156 (H) 06/04/2017 0314   BUN 15 06/04/2017 0314   CREATININE 0.84 06/04/2017 0314   CALCIUM 7.1 (L) 06/04/2017 0314   PROT 4.1 (L) 06/04/2017 0314   ALBUMIN 1.3  (L) 06/04/2017 0314   AST 19 06/04/2017 0314   ALT 12 (L) 06/04/2017 0314   ALKPHOS 93 06/04/2017 0314   BILITOT 0.7 06/04/2017 0314   GFRNONAA >60 06/04/2017 0314   GFRAA >60 06/04/2017 0314   Lipase     Component Value Date/Time   LIPASE 31 05/14/2017 2106       Studies/Results: Dg Chest Port 1 View  Result Date: 06/04/2017 CLINICAL DATA:  66 year old female with respiratory failure. Subsequent encounter. EXAM: PORTABLE CHEST 1 VIEW COMPARISON:  05/31/2017. FINDINGS: Right central line tip proximal right atrium level. Nasogastric tube courses below the diaphragm. Tip is not included on the present exam. Cardiomegaly. Pulmonary vascular congestion/mild pulmonary edema. Basilar atelectasis suspected. Infiltrate felt to be less likely consideration. Small pleural effusions suspected. Calcified aorta. No pneumothorax. IMPRESSION: Pulmonary vascular congestion/ mild pulmonary edema. Small pleural effusions suspected. Basilar subsegmental atelectasis. Cardiomegaly. Electronically Signed   By: Genia Del M.D.   On: 06/04/2017 07:02   Dg Abd Portable 1v  Result Date: 06/03/2017 CLINICAL DATA:  Nasogastric tube placement EXAM: PORTABLE ABDOMEN - 1 VIEW COMPARISON:  Portable exam 0943 hours compared to 05/29/2017 FINDINGS: Tip of nasogastric tube projects over proximal stomach. Gas and stool in transverse colon. Nonobstructive bowel gas pattern. No acute osseous findings. IMPRESSION: Tip of nasogastric tube projects over proximal stomach. Electronically Signed   By: Crist Infante.D.  On: 06/03/2017 10:19    Anti-infectives: Anti-infectives    Start     Dose/Rate Route Frequency Ordered Stop   06/03/17 1200  vancomycin (VANCOCIN) IVPB 1000 mg/200 mL premix     1,000 mg 200 mL/hr over 60 Minutes Intravenous Every 12 hours 06/03/17 1141     06/02/17 1000  fluconazole (DIFLUCAN) IVPB 400 mg     400 mg 100 mL/hr over 120 Minutes Intravenous Every 24 hours 06/01/17 1017     06/01/17 1100   fluconazole (DIFLUCAN) IVPB 800 mg     800 mg 100 mL/hr over 240 Minutes Intravenous  Once 06/01/17 1017 06/01/17 1544   06/01/17 1100  vancomycin (VANCOCIN) IVPB 750 mg/150 ml premix  Status:  Discontinued     750 mg 150 mL/hr over 60 Minutes Intravenous Every 12 hours 06/01/17 1051 06/03/17 1141   05/31/17 1145  vancomycin (VANCOCIN) IVPB 750 mg/150 ml premix     750 mg 150 mL/hr over 60 Minutes Intravenous NOW 05/31/17 1130 05/31/17 1258   05/31/17 0930  vancomycin (VANCOCIN) IVPB 750 mg/150 ml premix  Status:  Discontinued     750 mg 150 mL/hr over 60 Minutes Intravenous Every 12 hours 05/31/17 0911 05/31/17 1134   05/30/17 1800  piperacillin-tazobactam (ZOSYN) IVPB 3.375 g     3.375 g 12.5 mL/hr over 240 Minutes Intravenous Every 8 hours 05/30/17 1734 06/06/17 2159   05/30/17 1159  ceFAZolin (ANCEF) 2-4 GM/100ML-% IVPB    Comments:  Bridget Hartshorn   : cabinet override      05/30/17 1159 05/30/17 1259   05/30/17 0600  ceFAZolin (ANCEF) IVPB 2g/100 mL premix     2 g 200 mL/hr over 30 Minutes Intravenous On call to O.R. 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 - incentive spirometry  - palliative care consult   FEN - clamp NG, sips of clears; TNA, hypokalemia (3.1), prealbumin <5 ID - leukocytosis resolved; Ancef x 1 dose 6/27, Zosyn 6/28 >>, Vanc 6/28 >>, Diflucan 6/30 >>  VTE - SCD's, ok to start chemical VTE from surgical perspective    LOS: 6 days    Jill Alexanders , [facility] 06/04/2017, 9:30 AM Pager: [phone] Consults: [phone] Mon-Fri 7:00 am-4:30 pm Sat-Sun 7:00 am-11:30 am

## 2017-06-04 NOTE — Progress Notes (Signed)
PHARMACY - ADULT TOTAL PARENTERAL NUTRITION CONSULT NOTE   Pharmacy Consult for TPN Indication: bowel obstruction  Patient Measurements: Height: 5' 7"  (170.2 cm) Weight: 222 lb 10.6 oz (101 kg) IBW/kg (Calculated) : 61.6 TPN AdjBW (KG): 71.3 Body mass index is 34.87 kg/m.  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  S/p exp lap on 6/27 --   peritoneal nodule bx with drainage of abdominal abscess, repair of small bowel perforation, repair of small bowel enterotomy, and enterocolostomy performed.  Pharmacy is consulted to dose TPN.  Insulin Requirements:  Resistant SSI q4h - 21 units / 24 hrs  Current Nutrition: NPO (since 6/28), clear liquids ordered today  IVF: D5 NS at 85 ml/hr  Central access: Implanted port. PICC placed 7/2. TPN start date: 7/1  Significant events:  6/27: Ex-lap 7/2: small BM overnight. Clamp NG. Clear liquids started.   Today:   Glucose (goal < 150), No hx DM but HgbA1C of 6.6%.  CBG range since initiation of TPN 150-159.  Electrolytes - K+ low at 3.1, Mag on lower end of normal range at 1.7, Phos at lower end of normal range at 2.5, CorrCa WNL at 9.26.   Renal - SCr 0.84  LFTs - AST, Alk Phos, Tbili WNL. ALT low.   TGs - 80  Prealbumin - 8.3 (6/28), < 5 (7/2)  NUTRITIONAL GOALS                                                                                             RD recs:  Kcal:  2070-2350 (22-25 kcal/kg) Protein:  115-130 (1.2-1.4 grams/kg)  Clinimix 5/15 at a goal rate of 100 ml/hr + 20% IV fat emulsion at 20 ml/hr (x 12 hours/day)  to provide:120 g/day protein, 2184 Kcal/day.   PLAN                                                                                                                          Magnesuim 1g IV x 1 and KCl 59mq IV x 4 runs already ordered by MD  KPhos 120ml IV x 1 now  Per discussion with Dr. SiCandiss Norsewith  elevated CBGs and starting clear liquids, decrease IVF to 30 ml/hr now, and then d/c IVF once new TPN bag hangs tonight.  At 1800 today:  Increase Clinimix E 5/15 to 70 ml/hr.  Since patient out of ICU, will start 20% IV fat emulsion at 20 ml/hr (x 12 hours/day).  Plan to advance as tolerated to the goal rate.  TPN  to contain standard multivitamins daily and trace elements only MWF due to [employer].    Continue CBGs and resistant SSI q4h  TPN lab panels on Mondays & Thursdays.  BMET, Mag, Phos in AM.  F/u ability to tolerate diet.  Lindell Spar, PharmD, BCPS Pager: [phone] 06/04/2017 12:29 PM

## 2017-06-04 NOTE — Progress Notes (Signed)
Spoke to nurse caring for this patient regarding the PICC order. She will clarify why a PICC is needed since the patient has an accessed port-a-cath and a PIV. The nurse will update the order. Catalina Pizza

## 2017-06-04 NOTE — Care Management Important Message (Signed)
Important Message  Patient Details  Name: Nakayla Rorabaugh MRN: [id number] Date of Birth: 06/05/1951   Medicare Important Message Given:  Yes    Kerin Salen 06/04/2017, 2:39 [place] Message  Patient Details  Name: Maral Lampe MRN: [id number] Date of Birth: 1951-09-02   Medicare Important Message Given:  Yes    Kerin Salen 06/04/2017, 2:39 PM

## 2017-06-04 NOTE — Progress Notes (Signed)
Patient cleaned and repositioned by HW, JC, FN.

## 2017-06-05 LAB — GLUCOSE, CAPILLARY
GLUCOSE-CAPILLARY: 156 mg/dL — AB (ref 65–99)
GLUCOSE-CAPILLARY: 181 mg/dL — AB (ref 65–99)
GLUCOSE-CAPILLARY: 190 mg/dL — AB (ref 65–99)
GLUCOSE-CAPILLARY: 193 mg/dL — AB (ref 65–99)
Glucose-Capillary: 176 mg/dL — ABNORMAL HIGH (ref 65–99)
Glucose-Capillary: 192 mg/dL — ABNORMAL HIGH (ref 65–99)
Glucose-Capillary: 202 mg/dL — ABNORMAL HIGH (ref 65–99)

## 2017-06-05 LAB — CULTURE, BLOOD (ROUTINE X 2)
CULTURE: NO GROWTH
CULTURE: NO GROWTH
SPECIAL REQUESTS: ADEQUATE
Special Requests: ADEQUATE

## 2017-06-05 LAB — BASIC METABOLIC PANEL
ANION GAP: 6 (ref 5–15)
BUN: 13 mg/dL (ref 6–20)
CO2: 21 mmol/L — AB (ref 22–32)
Calcium: 7.1 mg/dL — ABNORMAL LOW (ref 8.9–10.3)
Chloride: 106 mmol/L (ref 101–111)
Creatinine, Ser: 0.7 mg/dL (ref 0.44–1.00)
GLUCOSE: 237 mg/dL — AB (ref 65–99)
POTASSIUM: 3 mmol/L — AB (ref 3.5–5.1)
Sodium: 133 mmol/L — ABNORMAL LOW (ref 135–145)

## 2017-06-05 LAB — PHOSPHORUS: PHOSPHORUS: 2.7 mg/dL (ref 2.5–4.6)

## 2017-06-05 LAB — MAGNESIUM: Magnesium: 1.4 mg/dL — ABNORMAL LOW (ref 1.7–2.4)

## 2017-06-05 MED ORDER — POTASSIUM CHLORIDE 10 MEQ/100ML IV SOLN
10.0000 meq | INTRAVENOUS | Status: AC
  Administered 2017-06-05 (×4): 10 meq via INTRAVENOUS
  Filled 2017-06-05 (×4): qty 100

## 2017-06-05 MED ORDER — ALUM & MAG HYDROXIDE-SIMETH 200-200-20 MG/5ML PO SUSP
30.0000 mL | Freq: Four times a day (QID) | ORAL | Status: DC | PRN
  Administered 2017-06-05: 30 mL via ORAL
  Filled 2017-06-05: qty 30

## 2017-06-05 MED ORDER — TRACE MINERALS CR-CU-MN-SE-ZN 10-1000-500-60 MCG/ML IV SOLN
INTRAVENOUS | Status: DC
  Filled 2017-06-05: qty 1680

## 2017-06-05 MED ORDER — LORAZEPAM 0.5 MG PO TABS
0.5000 mg | ORAL_TABLET | Freq: Four times a day (QID) | ORAL | Status: DC | PRN
  Administered 2017-06-05 – 2017-06-06 (×3): 0.5 mg via ORAL
  Filled 2017-06-05 (×3): qty 1

## 2017-06-05 MED ORDER — POTASSIUM CHLORIDE 20 MEQ/15ML (10%) PO SOLN
40.0000 meq | Freq: Once | ORAL | Status: AC
  Administered 2017-06-05: 10:00:00 40 meq via ORAL
  Filled 2017-06-05: qty 30

## 2017-06-05 MED ORDER — FAT EMULSION 20 % IV EMUL
240.0000 mL | INTRAVENOUS | Status: AC
Start: 2017-06-05 — End: 2017-06-06
  Administered 2017-06-05: 240 mL via INTRAVENOUS
  Filled 2017-06-05: qty 250

## 2017-06-05 MED ORDER — METOPROLOL TARTRATE 50 MG PO TABS
50.0000 mg | ORAL_TABLET | Freq: Two times a day (BID) | ORAL | Status: DC
  Administered 2017-06-05 – 2017-06-08 (×7): 50 mg via ORAL
  Filled 2017-06-05 (×7): qty 1

## 2017-06-05 MED ORDER — M.V.I. ADULT IV INJ
INTRAVENOUS | Status: AC
  Administered 2017-06-05: 18:00:00 via INTRAVENOUS
  Filled 2017-06-05: qty 1680

## 2017-06-05 MED ORDER — MAGNESIUM SULFATE 2 GM/50ML IV SOLN
2.0000 g | Freq: Once | INTRAVENOUS | Status: AC
  Administered 2017-06-05: 07:00:00 2 g via INTRAVENOUS
  Filled 2017-06-05: qty 50

## 2017-06-05 MED ORDER — FAT EMULSION 20 % IV EMUL
240.0000 mL | INTRAVENOUS | Status: DC
  Filled 2017-06-05: qty 250

## 2017-06-05 NOTE — NC FL2 (Signed)
[place] LEVEL OF CARE SCREENING TOOL     IDENTIFICATION  Patient Name: Lori [name] Birthdate: [birth year] Sex: female Admission Date (Current Location): 05/29/2017  [facility] and [place] Number:  Herbalist and Address:  [facility],  [place] [address], [place]      Provider Number: [id number]  Attending Physician Name and Address:  Thurnell Lose, MD  Relative Name and Phone Number:       Current Level of Care: Hospital Recommended Level of Care: [place] Prior Approval Number:    Date Approved/Denied:   PASRR Number: [id number]  Discharge Plan: SNF    Current Diagnoses: Patient Active Problem List   Diagnosis Date Noted  . Severe sepsis with septic shock ([place])   . Peritoneal carcinomatosis ([place])   . Seizure ([place])   . SBO (small bowel obstruction) ([place]) 05/15/2017  . HTN (hypertension) 05/15/2017  . Pulmonary nodule 03/26/2017  . 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/24/2015    Orientation RESPIRATION BLADDER Height & Weight     Self, Time, Situation, Place  Normal Indwelling catheter Weight: 222 lb 10.6 oz (101 kg) Height:  5\' 7"  (170.2 cm)  BEHAVIORAL SYMPTOMS/MOOD NEUROLOGICAL BOWEL NUTRITION STATUS  Other (Comment) (No behaviors)   Incontinent Diet (See d/c summary- TNA to stop on 7/4)  AMBULATORY STATUS COMMUNICATION OF NEEDS Skin   Extensive Assist Verbally Surgical wounds (Wound Vac to abdomen)                       Personal Care Assistance Level of Assistance  Bathing, Feeding, Dressing Bathing Assistance: Maximum assistance Feeding assistance: Independent Dressing Assistance: Maximum assistance     Functional Limitations Info  Sight, Hearing, Speech Sight Info: Adequate Hearing Info: Adequate Speech Info: Adequate    SPECIAL CARE FACTORS  FREQUENCY  PT (By licensed PT), OT (By licensed OT)     PT Frequency: 5x wk OT Frequency: 5x wk            Contractures Contractures Info: Not present    Additional Factors Info  Code Status Code Status Info: Full Code             Current Medications (06/05/2017):  This is the current hospital active medication list Current Facility-Administered Medications  Medication Dose Route Frequency Provider Last Rate Last Dose  . acetaminophen (TYLENOL) suppository 650 mg  650 mg Rectal Q6H PRN Rayburn, Kelly A, PA-C      . aspirin chewable tablet 81 mg  81 mg Oral Daily Simonne Maffucci B, MD   81 mg at 06/05/17 1003  . chlorhexidine (PERIDEX) 0.12 % solution 15 mL  15 mL Mouth Rinse BID Simonne Maffucci B, MD   15 mL at 06/03/17 1023  . Chlorhexidine Gluconate Cloth 2 % PADS 6 each  6 each Topical Q0600 Rosita Fire, MD   6 each at 06/04/17 1000  . diphenhydrAMINE (BENADRYL) injection 25 mg  25 mg Intravenous Q6H PRN Deterding, Guadelupe Sabin, MD   25 mg at 06/01/17 1319  . TPN (CLINIMIX-E) Adult   Intravenous Continuous TPN Luiz Ochoa, [facility]       And  . fat emulsion 20 % infusion 240 mL  240 mL Intravenous Continuous TPN Luiz Ochoa, RPH      . fluconazole (DIFLUCAN) IVPB 400 mg  400 mg Intravenous Q24H  Thomes Lolling, RPH 100 mL/hr at 06/05/17 1235 400 mg at 06/05/17 1235  . heparin injection 5,000 Units  5,000 Units Subcutaneous Q8H Thurnell Lose, MD   5,000 Units at 06/05/17 1335  . hydrALAZINE (APRESOLINE) injection 10 mg  10 mg Intravenous Q6H PRN Thurnell Lose, MD      . insulin aspart (novoLOG) injection 0-20 Units  0-20 Units Subcutaneous Q4H Juanito Doom, MD   4 Units at 06/05/17 1200  . levETIRAcetam (KEPPRA) 500 mg in sodium chloride 0.9 % 100 mL IVPB  500 mg Intravenous Q12H Rosita Fire, MD   Stopped at 06/05/17 1017  . MEDLINE mouth rinse  15 mL Mouth Rinse q12n4p Simonne Maffucci B, MD   15 mL at 06/05/17 1200  . metoprolol  tartrate (LOPRESSOR) injection 5 mg  5 mg Intravenous Q4H PRN Thurnell Lose, MD   5 mg at 06/04/17 2314  . metoprolol tartrate (LOPRESSOR) tablet 50 mg  50 mg Oral BID Thurnell Lose, MD   50 mg at 06/05/17 1200  . morphine 4 MG/ML injection 1 mg  1 mg Intravenous Q3H PRN Simonne Maffucci B, MD   1 mg at 06/05/17 1321  . ondansetron (ZOFRAN) injection 4 mg  4 mg Intravenous Q6H PRN Rise Patience, MD   4 mg at 05/30/17 1247  . pantoprazole (PROTONIX) injection 40 mg  40 mg Intravenous Q24H Rosita Fire, MD   40 mg at 06/05/17 1002  . piperacillin-tazobactam (ZOSYN) IVPB 3.375 g  3.375 g Intravenous Q8H Rayburn, Kelly A, PA-C   Stopped at 06/05/17 0954  . polyethylene glycol (MIRALAX / GLYCOLAX) packet 17 g  17 g Oral Daily Jill Alexanders, PA-C   17 g at 06/04/17 1113  . sodium chloride 0.9 % bolus 500 mL  500 mL Intravenous Once Rayburn, Kelly A, PA-C      . sodium chloride flush (NS) 0.9 % injection 10-40 mL  10-40 mL Intracatheter PRN Rosita Fire, MD   10 mL at 06/05/17 0551  . TPN (CLINIMIX-E) Adult   Intravenous Continuous TPN Luiz Ochoa, RPH 70 mL/hr at 06/04/17 1750     Facility-Administered Medications Ordered in Other Encounters  Medication Dose Route Frequency Provider Last Rate Last Dose  . sodium chloride flush (NS) 0.9 % injection 10 mL  10 mL Intravenous PRN Baird Cancer, PA-C   10 mL at 03/26/17 1050     Discharge Medications: Please see discharge summary for a list of discharge medications.  Relevant Imaging Results:  Relevant Lab Results:   Additional Information SS # [id number]  Haidinger, Randall An, LCSW

## 2017-06-05 NOTE — Progress Notes (Addendum)
@IPLOG @        PROGRESS NOTE                                                                                                                                                                                                             Patient Demographics:    Lori [name], is a 66 y.o. female, DOB - [birth year], MNO:[id number]  Admit date - 05/29/2017   Admitting Physician Rise Patience, MD  Outpatient Primary MD for the patient is [name], MD  LOS - 7  Chief Complaint  Patient presents with  . 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 She was admitted to the hospital on 05/29/2017 with septic shock arising from intra-abdominal abscess along with small bowel obstruction secondary to metastatic adenocarcinoma of her appendix, she was under the care of critical care in ICU, she was seen by general surgery and oncology, she underwent expiratory laparotomy with drainage of the intra-abdominal abscess, repair of small bowel perforation with enterocolostomy. She was transferred to my care on 06/04/2017 on TNA.   Past oncological history   Mucinous Adenocarcinoma of appendix, recurrent metastatic disease (peritoneal carcinomatosis), Stage IV (pT4a Nx M1):  Surgery (Dr Deland Pretty), 04/19/16: Laparoscopic appendectomy. R1 resection, No systemic therapy administered after case review with the GI Tumor Board Caroleen Hamman, PA-C), Surgery (Dr Doralee Albino), 05/30/17: Diagnostic laparoscopy with peritoneal no acute biopsy. Exploratory laparotomy, drainage of abdominal abscess, repair of small bowel perforation, repair of small bowel enterotomy, enterocolostomy.    Carcinoma of the uterine cervix, FIGO Stage IIB, HPV p16-positive: Clinical Course: -Initial Dx, 04/05/15:  Endometrial biopsy -- Adenocarcinoma, HPV 16 positive. Recurrence, 01/12/16: PET-CT -- Single hypermetabolic retroperitoneal left periaortic lymph node, worrisome for metastatic disease. No additional areas of abnormal hypermetabolism in the neck, chest, abdomen or pelvis. PET-CT, 04/04/16: Interval decrease in FDG uptake associated with retroperitoneal hypermetabolic lymph node reported on previous exam. Findings compatible with response to therapy. PET-CT, 02/02/17: New hypermetabolic 0.7 cm medial right upper lobe pulmonary nodule. Slight growth of additional tiny 0.3 cm apical right upper lobe pulmonary nodule. Findings are suspicious for pulmonary metastases. No additional sites of hypermetabolic metastatic disease. No evidence of hypermetabolic recurrence in the cervix or appendectomy bed.     Subjective:   Patient in bed, appears comfortable, denies any headache, no  fever, no chest pain or pressure, no shortness of breath , no abdominal pain. No focal weakness.    Assessment  & Plan :     19.  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   She was admitted to the hospital on 05/29/2017 with septic shock arising from intra-abdominal abscess along with small bowel obstruction secondary to metastatic adenocarcinoma of her appendix, she was under the care of critical care in ICU, she was seen by general surgery and oncology, she underwent expiratory laparotomy with drainage of the intra-abdominal abscess, repair of small bowel perforation with enterocolostomy. She was transferred to my care on 06/04/2017 on TNA. Sepsis and shock physiology have resolved.  I discussed her case with general surgeon Dr. Hassell Done on 06-2017, her long-term prognosis appears poor, she is not as candidate for repeat surgery, she was seen by  oncology as well, at this time plan is to continue medical management, oncology will try to do palliative chemotherapy or radiation in the outpatient setting if she recovers well.  For now continue present antibiotic and Diflucan, NG tube was removed yesterday and she is so far tolerating liquid diet which will be gradually advanced, continue TNA, initiate PT, may require placement. If stable likely can be transitioned to oral antibiotics for 7 more days if no further nausea vomiting and continues to tolerate oral diet. She has a left arm PICC line for TNA.   2. Anemia of chronic disease. Monitor no need for transfusion.  3. Hypokalemia. Replaced.  4. Seizure disorder. On IV Keppra. Switch to oral once tolerating oral diet on a consistent basis.  5. Hypertension. In poor control added scheduled Lopressor along with as needed IV hydralazine and Lopressor.  6. GERD. Currently on IV PPI.  7. Hypokalemia and hypomagnesemia. Both replaced.   Diet : TPN (CLINIMIX-E) Adult Diet full liquid Room service appropriate? Yes; Fluid consistency: Thin    Family Communication  :  None  Code Status :  Full  Disposition Plan  :  TBD  Consults  :  PCCM,CCS, Onc, Pall care  Procedures  :     DVT Prophylaxis  :   Heparin    Lab Results  Component Value Date   PLT 231 06/04/2017    Inpatient Medications  Scheduled Meds: . aspirin  81 mg Oral Daily  . chlorhexidine  15 mL Mouth Rinse BID  . Chlorhexidine Gluconate Cloth  6 each Topical Q0600  . heparin subcutaneous  5,000 Units Subcutaneous Q8H  . insulin aspart  0-20 Units Subcutaneous Q4H  . mouth rinse  15 mL Mouth Rinse q12n4p  . metoprolol tartrate  50 mg Oral BID  . pantoprazole (PROTONIX) IV  40 mg Intravenous Q24H  . polyethylene glycol  17 g Oral Daily   Continuous Infusions: . fluconazole (DIFLUCAN) IV Stopped (06/04/17 1543)  . levETIRAcetam Stopped (06/05/17 1017)  . piperacillin-tazobactam Stopped (06/05/17 0954)  .  sodium chloride    . Marland KitchenTPN (CLINIMIX-E) Adult 70 mL/hr at 06/04/17 1750   PRN Meds:.acetaminophen, diphenhydrAMINE, hydrALAZINE, metoprolol tartrate, morphine injection, [DISCONTINUED] ondansetron **OR** ondansetron (ZOFRAN) IV, sodium chloride flush  Antibiotics  :    Anti-infectives    Start     Dose/Rate Route Frequency Ordered Stop   06/03/17 1200  vancomycin (VANCOCIN) IVPB 1000 mg/200 mL premix  Status:  Discontinued  1,000 mg 200 mL/hr over 60 Minutes Intravenous Every 12 hours 06/03/17 1141 06/04/17 1105   06/02/17 1000  fluconazole (DIFLUCAN) IVPB 400 mg     400 mg 100 mL/hr over 120 Minutes Intravenous Every 24 hours 06/01/17 1017     06/01/17 1100  fluconazole (DIFLUCAN) IVPB 800 mg     800 mg 100 mL/hr over 240 Minutes Intravenous  Once 06/01/17 1017 06/01/17 1544   06/01/17 1100  vancomycin (VANCOCIN) IVPB 750 mg/150 ml premix  Status:  Discontinued     750 mg 150 mL/hr over 60 Minutes Intravenous Every 12 hours 06/01/17 1051 06/03/17 1141   05/31/17 1145  vancomycin (VANCOCIN) IVPB 750 mg/150 ml premix     750 mg 150 mL/hr over 60 Minutes Intravenous NOW 05/31/17 1130 05/31/17 1258   05/31/17 0930  vancomycin (VANCOCIN) IVPB 750 mg/150 ml premix  Status:  Discontinued     750 mg 150 mL/hr over 60 Minutes Intravenous Every 12 hours 05/31/17 0911 05/31/17 1134   05/30/17 1800  piperacillin-tazobactam (ZOSYN) IVPB 3.375 g     3.375 g 12.5 mL/hr over 240 Minutes Intravenous Every 8 hours 05/30/17 1734 06/06/17 2159   05/30/17 1159  ceFAZolin (ANCEF) 2-4 GM/100ML-% IVPB    Comments:  Bridget Hartshorn   : cabinet override      05/30/17 1159 05/30/17 1259   05/30/17 0600  ceFAZolin (ANCEF) IVPB 2g/100 mL premix     2 g 200 mL/hr over 30 Minutes Intravenous On call to O.R. 05/29/17 1430 05/30/17 1259         Objective:   Vitals:   06/04/17 2256 06/04/17 2314 06/05/17 0026 06/05/17 0529  BP: (!) 163/100 (!) 158/102 (!) 162/95 (!) 142/87  Pulse: (!) 123 (!) 122 (!)  109 (!) 113  Resp: 20   20  Temp: 98.6 F (37 C)   99.3 F (37.4 C)  TempSrc: Oral   Oral  SpO2: 95%   97%  Weight:      Height:        Wt Readings from Last 3 Encounters:  06/01/17 101 kg (222 lb 10.6 oz)  05/17/17 95.5 kg (210 lb 8.6 oz)  04/27/17 98 kg (216 lb)     Intake/Output Summary (Last 24 hours) at 06/05/17 1137 Last data filed at 06/05/17 0930  Gross per 24 hour  Intake          5443.25 ml  Output             1150 ml  Net          4293.25 ml     Physical Exam  Awake Alert, Oriented X 3, No new F.N deficits, Normal affect [place].AT,PERRAL Supple Neck,No JVD, No cervical lymphadenopathy appriciated.  Symmetrical Chest wall movement, Good air movement bilaterally, CTAB RRR,No Gallops,Rubs or new Murmurs, No Parasternal Heave +ve B.Sounds, Abd Soft, Midline incision site stable and the bandage, NG tube has been now removed No Cyanosis, Clubbing or edema, No new Rash or bruise    Data Review:    CBC  Recent Labs Lab 05/31/17 0402 06/01/17 0935 06/02/17 0428 06/03/17 0419 06/04/17 0314  WBC 8.5 15.7* 13.6* 12.9* 9.9  HGB 12.4 11.2* 10.1* 9.1* 9.1*  HCT 38.6 34.1* 30.6* 27.9* 27.1*  PLT 334 334 256 237 231  MCV 89.1 89.3 88.2 86.9 87.4  MCH 28.6 29.3 29.1 28.3 29.4  MCHC 32.1 32.8 33.0 32.6 33.6  RDW 14.2 14.8 14.8 14.7 15.1  LYMPHSABS  --   --   --   --  0.8  MONOABS  --   --   --   --  0.3  EOSABS  --   --   --   --  0.1  BASOSABS  --   --   --   --  0.0    Chemistries   Recent Labs Lab 05/30/17 0644  06/01/17 0532 06/02/17 0428 06/03/17 0419 06/04/17 0314 06/05/17 0544  NA 136  < > 133* 131* 135 135 133*  K 4.1  < > 4.2 3.6 3.5 3.1* 3.0*  CL 102  < > 107 106 108 110 106  CO2 24  < > 18* 20* 20* 20* 21*  GLUCOSE 96  < > 293* 159* 184* 156* 237*  BUN 21*  < > 32* 22* 19 15 13   CREATININE 0.88  < > 1.30* 1.02* 0.93 0.84 0.70  CALCIUM 8.0*  < > 6.7* 6.9* 7.0* 7.1* 7.1*  MG 1.8  --   --   --  1.6* 1.7 1.4*  AST  --   --   --   --   --   19  --   ALT  --   --   --   --   --  12*  --   ALKPHOS  --   --   --   --   --  93  --   BILITOT  --   --   --   --   --  0.7  --   < > = values in this interval not displayed. ------------------------------------------------------------------------------------------------------------------  Recent Labs  06/04/17 0314  TRIG 80    Lab Results  Component Value Date   HGBA1C 6.6 (H) 04/14/2016   ------------------------------------------------------------------------------------------------------------------ No results for input(s): TSH, T4TOTAL, T3FREE, THYROIDAB in the last 72 hours.  Invalid input(s): FREET3 ------------------------------------------------------------------------------------------------------------------ No results for input(s): VITAMINB12, FOLATE, FERRITIN, TIBC, IRON, RETICCTPCT in the last 72 hours.  Coagulation profile No results for input(s): INR, PROTIME in the last 168 hours.  No results for input(s): DDIMER in the last 72 hours.  Cardiac Enzymes  Recent Labs Lab 06/01/17 0935 06/02/17 0428  TROPONINI 0.06* 0.04*   ------------------------------------------------------------------------------------------------------------------ No results found for: BNP  Micro Results Recent Results (from the past 240 hour(s))  Urine Culture     Status: None   Collection Time: 05/31/17 10:50 AM  Result Value Ref Range Status   Specimen Description URINE, CATHETERIZED  Final   Special Requests NONE  Final   Culture   Final    NO GROWTH Performed at [facility], [place] [address] [place], [place] [address]    Report Status 06/01/2017 FINAL  Final  Culture, blood (Routine X 2) w Reflex to ID Panel     Status: None (Preliminary result)   Collection Time: 05/31/17 12:10 PM  Result Value Ref Range Status   Specimen Description BLOOD RIGHT HAND  Final   Special Requests IN PEDIATRIC BOTTLE Blood Culture adequate volume  Final   Culture   Final    NO  GROWTH 4 DAYS Performed at [facility], [place] [address], [place], [place] [address]    Report Status PENDING  Incomplete  Culture, blood (Routine X 2) w Reflex to ID Panel     Status: None (Preliminary result)   Collection Time: 05/31/17  2:34 PM  Result Value Ref Range Status   Specimen Description BLOOD RIGHT HAND  Final   Special Requests IN PEDIATRIC BOTTLE Blood Culture adequate volume  Final  Culture   Final    NO GROWTH 4 DAYS Performed at [facility], [place] [address] [place], [place] [address]    Report Status PENDING  Incomplete    Radiology Reports Ct Abdomen Pelvis Wo Contrast  Result Date: 05/15/2017 CLINICAL DATA:  Abdominal pain and constipation EXAM: CT ABDOMEN AND PELVIS WITHOUT CONTRAST TECHNIQUE: Multidetector CT imaging of the abdomen and pelvis was performed following the standard protocol without IV contrast. COMPARISON:  05/05/2017, 05/03/2017 FINDINGS: Lower chest: Lung bases demonstrate no acute consolidation or effusion. There is cardiomegaly. Small pericardial effusion. Coronary artery calcification. Hiatal hernia with distal esophageal thickening. Hepatobiliary: No calcified gallstones. No biliary dilatation or focal hepatic abnormality. Pancreas: Unremarkable. No pancreatic ductal dilatation or surrounding inflammatory changes. Spleen: Normal in size without focal abnormality. Adrenals/Urinary Tract: Adrenal glands are within normal limits. Atrophic and scarred left kidney. No hydronephrosis. The bladder is unremarkable. Stomach/Bowel: Marked enlargement of the stomach. Multiple loops of dilated, fluid-filled small bowel with slight increased distension since the previous examination. Findings consistent with mechanical bowel obstruction. No colon wall thickening. Prior appendectomy. Vascular/Lymphatic: Aortic atherosclerosis. No enlarged abdominal or pelvic lymph nodes. Reproductive: Suspect that there is thickening of the endometrial stripe, measuring 19  mm on sagittal views. No adnexal masses. Other: New or increased free fluid in the pelvis and around the liver. No free air. Small pockets of soft tissue gas in the left gluteal region. Musculoskeletal: No acute or suspicious bone lesion. IMPRESSION: 1. Findings again consistent with mechanical small bowel obstruction, the stomach is markedly enlarged. Overall increased dilatation of the small bowel loops since the previous study, consistent with worsening obstruction. No free air, however increasing free fluid in the abdomen and pelvis. 2. Cardiomegaly with small pericardial effusion. 3. Suspect thickening of the endometrial stripe, this may be correlated with pelvic ultrasound 4. Electronically Signed   By: Donavan Foil M.D.   On: 05/15/2017 02:44   Dg Chest 1 View  Result Date: 05/15/2017 CLINICAL DATA:  Assess position of right-sided chest port. Initial encounter. EXAM: CHEST 1 VIEW COMPARISON:  PET/CT performed 02/02/2017 FINDINGS: The patient's right-sided chest port is noted ending about the distal SVC. An enteric tube is noted extending below the diaphragm. Mild peribronchial thickening is noted. No pleural effusion or pneumothorax is seen. The cardiomediastinal silhouette is borderline normal in size. No acute osseous abnormalities are identified. IMPRESSION: 1. Right-sided chest port noted ending about the distal SVC. 2. Mild peribronchial thickening noted.  Lungs otherwise clear. Electronically Signed   By: Garald Balding M.D.   On: 05/15/2017 03:41   Dg Abd 1 View  Result Date: 05/29/2017 CLINICAL DATA:  66 year old female with nasogastric tube placement. Subsequent encounter. EXAM: ABDOMEN - 1 VIEW COMPARISON:  05/29/2017. FINDINGS: Nasogastric tube tip and side hole gastric body level. Gas distended small bowel loops with thickened folds consistent with small bowel obstruction once again noted. The possibility of free intraperitoneal air cannot be assessed on a supine view. IMPRESSION:  Nasogastric tube tip and side hole gastric body level. Gas distended small bowel loops with thickened folds consistent with small bowel obstruction once again noted. Electronically Signed   By: Genia Del M.D.   On: 05/29/2017 07:06   Dg Abd 1 View  Result Date: 05/17/2017 CLINICAL DATA:  Follow-up of small bowel obstruction EXAM: ABDOMEN - 1 VIEW COMPARISON:  Abdominal radiograph of May 16, 2017 FINDINGS: There remain loops of moderately distended gas-filled small bowel in the mid abdomen. There is some gas  within the ascending and transverse colon. No free extraluminal gas collections are observed. There is a tiny amount of gas in the rectum. The esophagogastric tube has been removed. IMPRESSION: Findings compatible with a fairly high-grade mid to distal small bowel obstruction. No evidence of perforation. Electronically Signed   By: David  Martinique M.D.   On: 05/17/2017 08:38   Dg Abd 1 View  Result Date: 05/16/2017 CLINICAL DATA:  NG tube placement. EXAM: ABDOMEN - 1 VIEW COMPARISON:  05/15/2017 FINDINGS: An NG tube is identified with tip overlying the proximal stomach. Dilated small bowel loops are again noted. IMPRESSION: NG tube with tip overlying the proximal stomach. Unchanged dilated small bowel loops. Electronically Signed   By: Margarette Canada M.D.   On: 05/16/2017 19:03   Dg Abdomen 1 View  Result Date: 05/15/2017 CLINICAL DATA:  NG tube placement EXAM: ABDOMEN - 1 VIEW COMPARISON:  05/15/2017 FINDINGS: Esophageal tube tip overlies the mid stomach. Multiple loops of dilated small bowel consistent with a bowel obstruction. IMPRESSION: Esophageal tube tip overlies the mid stomach. Electronically Signed   By: Donavan Foil M.D.   On: 05/15/2017 03:36   Dg Chest Port 1 View  Result Date: 06/04/2017 CLINICAL DATA:  66 year old female with respiratory failure. Subsequent encounter. EXAM: PORTABLE CHEST 1 VIEW COMPARISON:  05/31/2017. FINDINGS: Right central line tip proximal right atrium level.  Nasogastric tube courses below the diaphragm. Tip is not included on the present exam. Cardiomegaly. Pulmonary vascular congestion/mild pulmonary edema. Basilar atelectasis suspected. Infiltrate felt to be less likely consideration. Small pleural effusions suspected. Calcified aorta. No pneumothorax. IMPRESSION: Pulmonary vascular congestion/ mild pulmonary edema. Small pleural effusions suspected. Basilar subsegmental atelectasis. Cardiomegaly. Electronically Signed   By: Genia Del M.D.   On: 06/04/2017 07:02   Dg Chest Port 1 View  Result Date: 05/31/2017 CLINICAL DATA:  Persistent hypotension EXAM: PORTABLE CHEST 1 VIEW COMPARISON:  05/29/2017 FINDINGS: NG tube tip is in the stomach. There are low lung volumes with bibasilar atelectasis. Mild cardiomegaly. No effusions or acute bony abnormality. IMPRESSION: Low lung volumes, bibasilar atelectasis. Electronically Signed   By: Rolm Baptise M.D.   On: 05/31/2017 09:40   Dg Abd Acute W/chest  Result Date: 05/29/2017 CLINICAL DATA:  History of cervical cancer, abdominal distention EXAM: DG ABDOMEN ACUTE W/ 1V CHEST COMPARISON:  05/17/2017, 05/15/2017 FINDINGS: Right-sided central venous port tip overlies the distal SVC. Small focus of atelectasis at the left base. No pleural effusion. Stable enlarged cardiomediastinal silhouette. Supine and upright views of the abdomen demonstrate multiple dilated loops of small bowel measuring up to 5.2 cm consistent with mechanical small bowel obstruction. No obvious free air on decubitus view. IMPRESSION: 1. No radiographic evidence for acute cardiopulmonary abnormality 2. Multiple loops of dilated small bowel, consistent with small bowel obstruction, this appears worsened radiographically compared with 05/17/2017. Electronically Signed   By: Donavan Foil M.D.   On: 05/29/2017 03:31   Dg Abd Portable 1v  Result Date: 06/03/2017 CLINICAL DATA:  Nasogastric tube placement EXAM: PORTABLE ABDOMEN - 1 VIEW COMPARISON:   Portable exam 0943 hours compared to 05/29/2017 FINDINGS: Tip of nasogastric tube projects over proximal stomach. Gas and stool in transverse colon. Nonobstructive bowel gas pattern. No acute osseous findings. IMPRESSION: Tip of nasogastric tube projects over proximal stomach. Electronically Signed   By: Lavonia Dana M.D.   On: 06/03/2017 10:19    Time Spent in minutes  30   Lala Lund M.D on 06/05/2017 at 11:37 AM  Between 7am to  7pm - Pager - [phone] ( page via [email or web address], text pages only, please mention full 10 digit call back number). After 7pm go to [email or web address] - password [facility] ([facility])

## 2017-06-05 NOTE — Progress Notes (Signed)
Nutrition Follow-up  DOCUMENTATION CODES:   Obesity unspecified  INTERVENTION:  - Continue TPN per Pharmacy. - Diet advancement as medically feasible. - RD will continue to monitor for additional nutrition-related needs.  Monitor magnesium, potassium, and phosphorus daily for at least 3 days, MD to replete as needed, as pt is at risk for refeeding syndrome given current hypokalemia and hypomagnesemia.    NUTRITION DIAGNOSIS:   Inadequate oral intake related to inability to eat as evidenced by NPO status. -diet advanced to CLD yesterday AM.  GOAL:   Patient will meet greater than or equal to 90% of their needs - unmet with CLD and current TPN rate.   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  S/p exp lap on 6/27 --   peritoneal nodule bx with drainage of abdominal abscess, repair of small bowel perforation, repair of small bowel enterotomy, and enterocolostomy performed.  NG tube remains in place and on bowel rest.    7/3 Pt remains on CLD. No new weight since 6/29. NGT removed yesterday. Pt currently receiving Clinimix E 5/15 @ 70 mL/hr with 20% ILE @ 20 mL/hr x12 hours which is providing 84 kcal (73% minimum estimated protein need) and 1673 kcal (81% minimum estimated kcal need).   Pharmacy note yesterday states goal for TPN will be Clinimix E 5/15 @ 100 mL/hr with 20% ILE @ 20 mL/hr x12 hours. This regimen will provide 120 grams of protein and 2184 kcal. Recommend goal rate of Clinimix E 5/15 @ 83 mL/hr (2L/day) with 20% ILE @ 20 mL/hr x12 hours to provide 1894 kcal (91% minimum estimated kcal need) and 100 grams of protein (87% minimum estimated protein need).  Do not feel that TPN regimen needs to meet >90% estimated needs d/t CLD, ability to add oral nutrition supplements, and  hopeful for diet advancement with good tolerance of CLD yesterday and ability to remove NGT. Do not recommend advancing TPN today, wait until tomorrow given current K and Mg.   Medications reviewed; sliding scale Novolog, 1 g IV Mg sulfate x1 run yesterday, 2 g IV Mg sulfate x1 run today, 40 mg IV Protonix/day, 1 packet Miralax/day, 10 mEq IV KCl x4 runs yesterday and x4 runs today, 40 mEq oral KCl x1 dose today. Labs reviewed; CBG: 190-193 mg/dL this AM, Na: 133 mmol/L, K: 3 mmol/L, Mg: 1.4 mg/dL, Ca: 7.1 mg/dL.    7/2 - Medical dx and course outlined above and is from Pharmacy note yesterday AM.  - NGT remains in place this AM and is currently clamped.  - Diet advanced from NPO to CLD today at 0823 and pt reports 100% completion of chicken broth, coffee, jello, and grape juice and that she saved ginger ale to sip on throughout the AM.  - She denies abdominal pain or nausea prior to or following intakes of clears. - Pt reports that for >1 week PTA she was experiencing abdominal pain, no N/V, which caused decreased appetite and decreased desire to eat even favorite foods.  - Physical assessment shows no muscle and no fat wasting.  - Per chart review, pt has gained 15 lbs (6.9 kg) since admission; estimated needs based on admission weight of 207 lbs (94.1 kg).  - Pt unsure of UBW or weight hx recently.  - Per chart review, she lost 6  lbs (2.8% body weight) in the 3 weeks PTA which is not significant for time frame. - Unable to state malnutrition based on available information and ASPEN guidelines.  - She is currently receiving Clinimix E 5/15 @ 40 mL/hr without ILE via R-sided Port-a-cath.  - This regimen is providing 682 kcal and 48 grams of protein.   Recommend goal: Clinimix E 5/15 @ 83 mL/hr with 20% ILE @ 12 mL/hr x12 hours to provide 1894 kcal (91% minimum estimated kcal need) and 100 grams of protein (87% minimum estimated protein need).  Palliative consult in this AM for GOC.  K: 3.1  mmol/L IVF: D5-NS @ 85 mL/hr (347 kcal from dextrose).    Diet Order:  Diet clear liquid Room service appropriate? Yes; Fluid consistency: Thin TPN (CLINIMIX-E) Adult  Skin:  Wound (see comment) (Abdominal incision from 05/30/17)  Last BM:  7/3  Height:   Ht Readings from Last 1 Encounters:  05/31/17 5\' 7"  (1.702 m)    Weight:   Wt Readings from Last 1 Encounters:  06/01/17 222 lb 10.6 oz (101 kg)    Ideal Body Weight:  61.36 kg  BMI:  Body mass index is 34.87 kg/m.  Estimated Nutritional Needs:   Kcal:  2070-2350 (22-25 kcal/kg)  Protein:  115-130 (1.2-1.4 grams/kg)  Fluid:  >/= 2.1 L/day  EDUCATION NEEDS:   No education needs identified at this time    Jarome Matin, MS, RD, LDN, CNSC Inpatient Clinical Dietitian Pager # [phone] After hours/weekend pager # [phone]

## 2017-06-05 NOTE — Clinical Social Work Note (Signed)
Clinical Social Work Assessment  Patient Details  Name: Lori [name] MRN: [id number] Date of Birth: 09-28-1951  Date of referral:  06/05/17               Reason for consult:  Discharge Planning                Permission sought to share information with:    Permission granted to share information::     Name::        Agency::     Relationship::     Contact Information:     Housing/Transportation Living arrangements for the past 2 months:  Single Family Home Source of Information:  Patient Patient Interpreter Needed:  None Criminal Activity/Legal Involvement Pertinent to Current Situation/Hospitalization:  No - Comment as needed Significant Relationships:  Adult Children, Other Family Members Lives with:  Relatives, Adult Children Do you feel safe going back to the place where you live?   (SNF recommended.) Need for family participation in patient care:  Yes (Comment)  Care giving concerns:  Pt may need more assistance than is available at home.    Social Worker assessment / plan: Pt hospitalized on 05/29/17 from home with SBO. PT has recommended SNF at d/c. CSW met with pt / family to assist with d/c planning. Pt will consider ST Rehab and discuss this option with her son. Pt gave CSW permission to initiate SNF search. Bed offers are pending and will be provided to pt / family when available. Pt has [employer] which requires prior authorization. CSW will assist with insurance authorization process. CSW will continue to follow to assist with d/c planning needs.  Employment status:  Retired Nurse, adult PT Recommendations:  [place] / Referral to community resources:  [place]  Patient/Family's Response to care:  Disposition to be determined  Patient/Family's Understanding of and Emotional Response to Diagnosis, Current Treatment, and Prognosis: " I feel a little better today. " Pt reports that she spoke  with her MD today and her diet will be advanced.  Pt is considering ST Rehab placement. " I was able to walk at home. I might need the rehab. " Pt appreciates CSW assistance with dc planning.  Emotional Assessment Appearance:  Appears stated age Attitude/Demeanor/Rapport:  Other (cooperative) Affect (typically observed):  Calm, Appropriate Orientation:  Oriented to Self, Oriented to Place, Oriented to  Time, Oriented to Situation Alcohol / Substance use:  Not Applicable Psych involvement (Current and /or in the community):  No (Comment)  Discharge Needs  Concerns to be addressed:  Discharge Planning Concerns Readmission within the last 30 days:  Yes Current discharge risk:  None Barriers to Discharge:  No Barriers Identified   Luretha Rued, [place] 06/05/2017, 3:42 PM

## 2017-06-05 NOTE — Clinical Social Work Placement (Signed)
   CLINICAL SOCIAL WORK PLACEMENT  NOTE  Date:  06/05/2017  Patient Details  Name: Lori [name] MRN: [id number] Date of Birth: [birth year]  Clinical Social Work is seeking post-discharge placement for this patient at the [place] level of care (*CSW will initial, date and re-position this form in  chart as items are completed):  Yes   Patient/family provided with [place] Work Department's list of facilities offering this level of care within the geographic area requested by the patient (or if unable, by the patient's family).  Yes   Patient/family informed of their freedom to choose among providers that offer the needed level of care, that participate in Medicare, Medicaid or managed care program needed by the patient, have an available bed and are willing to accept the patient.  Yes   Patient/family informed of [place]'s ownership interest in [facility] and [facility], as well as of the fact that they are under no obligation to receive care at these facilities.  PASRR submitted to EDS on 06/05/17     PASRR number received on 06/05/17     Existing PASRR number confirmed on       FL2 transmitted to all facilities in geographic area requested by pt/family on 06/05/17     FL2 transmitted to all facilities within larger geographic area on       Patient informed that his/her managed care company has contracts with or will negotiate with certain facilities, including the following:            Patient/family informed of bed offers received.  Patient chooses bed at       Physician recommends and patient chooses bed at      Patient to be transferred to   on  .  Patient to be transferred to facility by       Patient family notified on   of transfer.  Name of family member notified:        PHYSICIAN       Additional Comment:    _______________________________________________ Luretha Rued, [place] 06/05/2017,  4:04 PM

## 2017-06-05 NOTE — Progress Notes (Signed)
Incisions x3 left abdomen healed over. Staples removed x5. Will continued to monitor

## 2017-06-05 NOTE — Evaluation (Signed)
Physical Therapy Evaluation Patient Details Name: Lori [name] MRN: [id number] DOB: September 09, 1951 Today's Date: 06/05/2017   History of Present Illness  Lori [name] is a 66 y.o. female with medical history significant of cervical cancer and endometrial carcinoma , admitted 05/29/17 for   [place] OF INTRA ABDOMINAL ABSCESS OF SMALL BOWEL PERFORATION WITH ENTEROCOLOSTOMY and  S/p LAPAROSCOPY DIAGNOSTIC WITH PERITONEAL BIOPSY on 05/29/17  Clinical Impression  The patient is very motivated to mobilize butt is extremely limited in  Mobility. Currently requires 2 assistance for all rolling and sitting at bed edge, demonstrates decreased trunk control.  The patient currently will require mechanical lift to  Get OOB. Pt admitted with above diagnosis. Pt currently with functional limitations due to the deficits listed below (see PT Problem List).  Pt will benefit from skilled PT to increase their independence and safety with mobility to allow discharge to the venue listed below.       Follow Up Recommendations SNF;Supervision/Assistance - 24 hour    Equipment Recommendations  Rolling walker with 5" wheels;Wheelchair (measurements PT)    Recommendations for Other Services       Precautions / Restrictions Precautions Precautions: Fall Precaution Comments: open abdominal wound, frequent BM, profoundly weak  Lift OOB     Mobility  Bed Mobility Overal bed mobility: Needs Assistance Bed Mobility: Rolling;Sidelying to Sit;Sit to Sidelying Rolling: Total assist;Max assist;+2 for safety/equipment Sidelying to sit: Total assist;+2 for safety/equipment;+2 for physical assistance;HOB elevated     Sit to sidelying: Total assist General bed mobility comments: patient requires extensive assist to roll to each side, decreased use of the left arm to pull on bed rail.. Assist with trunk and legs for all mobility. Much edema about the thighs and trunk limiting   mobility.  Transfers                 General transfer comment: unable, does not hold balance in sitting.  Ambulation/Gait                Stairs            Wheelchair Mobility    Modified Rankin (Stroke Patients Only)       Balance Overall balance assessment: Needs assistance Sitting-balance support: Bilateral upper extremity supported;Feet supported Sitting balance-Leahy Scale: Zero Sitting balance - Comments: requires external support.                                     Pertinent Vitals/Pain Pain Assessment: 0-10 Pain Score: 7  Pain Location: abdomen Pain Descriptors / Indicators: Aching Pain Intervention(s): Patient requesting pain meds-RN notified;Repositioned    Home Living Family/patient expects to be discharged to:: Private residence Living Arrangements: Children Available Help at Discharge: Family Type of Home: House       Home Layout: One level Home Equipment: Kasandra Knudsen - single point      Prior Function Level of Independence: Independent with assistive device(s)   Gait / Transfers Assistance Needed: was            Hand Dominance        Extremity/Trunk Assessment   Upper Extremity Assessment Upper Extremity Assessment: Generalized weakness;LUE deficits/detail LUE Deficits / Details: decreased elevation    Lower Extremity Assessment Lower Extremity Assessment: Generalized weakness    Cervical / Trunk Assessment Cervical / Trunk Assessment: Other exceptions Cervical / Trunk Exceptions: decreased strength to sit up  Communication  Cognition Arousal/Alertness: Awake/alert Behavior During Therapy: WFL for tasks assessed/performed;Anxious Overall Cognitive Status: Within Functional Limits for tasks assessed                                        General Comments      Exercises General Exercises - Lower Extremity Ankle Circles/Pumps: AROM;10 reps Short Arc Quad: AROM;Both;10  reps Heel Slides: AAROM;Both;10 reps Hip ABduction/ADduction: AAROM;Both;10 reps   Assessment/Plan    PT Assessment Patient needs continued PT services  PT Problem List Decreased strength;Decreased range of motion;Decreased activity tolerance;Decreased balance;Decreased mobility;Decreased knowledge of precautions;Decreased safety awareness;Decreased knowledge of use of DME;Pain       PT Treatment Interventions Functional mobility training;Therapeutic activities;Therapeutic exercise;Patient/family education;Wheelchair mobility training    PT Goals (Current goals can be found in the Care Plan section)  Acute Rehab PT Goals Patient Stated Goal: to get OOB PT Goal Formulation: With patient Time For Goal Achievement: 06/19/17 Potential to Achieve Goals: Fair    Frequency Min 3X/week   Barriers to discharge Decreased caregiver support      Co-evaluation               AM-PAC PT "6 Clicks" Daily Activity  Outcome Measure Difficulty turning over in bed (including adjusting bedclothes, sheets and blankets)?: Total Difficulty moving from lying on back to sitting on the side of the bed? : Total Difficulty sitting down on and standing up from a chair with arms (e.g., wheelchair, bedside commode, etc,.)?: Total Help needed moving to and from a bed to chair (including a wheelchair)?: Total Help needed walking in hospital room?: Total Help needed climbing 3-5 steps with a railing? : Total 6 Click Score: 6    End of Session   Activity Tolerance: Patient limited by pain Patient left: in bed;with call bell/phone within reach;with bed alarm set Nurse Communication: Mobility status PT Visit Diagnosis: Difficulty in walking, not elsewhere classified (R26.2);Pain Pain - part of body: Leg    Time: 1111-1144 PT Time Calculation (min) (ACUTE ONLY): 33 min   Charges:   PT Evaluation $PT Eval Moderate Complexity: 1 Procedure PT Treatments $Therapeutic Activity: 8-22 mins   PT G  CodesTresa [name] PT [phone]   Lori [name] 06/05/2017, 2:23 PM

## 2017-06-05 NOTE — Consult Note (Addendum)
[place] Nurse wound consult note Reason for Consult: Initiate NPWT per Dr. [name] Wound type: Surgical Pressure Injury POA: N/A Measurement: 23cm x 5cm x 5cm Wound bed:60% red, 40% red and yellow (adipose) Drainage (amount, consistency, odor) small amount serous Periwound:intact, dry, clear Dressing procedure/placement/frequency: Old dressing removed, wound cleansed with NS.  Two pieces of black foam used to obliterate dead space; a 1/2 piece of barrier ring used to enhance seal at most distal part of wound. System is secured with drape. Attached to negative pressure (-155mmHg) and an immediate seal is achieved. Bedside RN to perform this dressing each T/Th/Sa. [place] nursing team will not follow routinely, but will remain available to this patient, the nursing, surgical and medical teams.  Please re-consult if needed. Thanks, Maudie Flakes, MSN, RN, [place], Arther Abbott  Pager# [phone]

## 2017-06-05 NOTE — Progress Notes (Signed)
[facility] Progress Note  6 Days Post-Op  Subjective: CC:  Denies abdominal pain. Tolerating clears without nausea/vomiting. Having bowel movements. Patient is not mobilizing or getting up to chair.  Objective: Vital signs in last 24 hours: Temp:  [98.4 F (36.9 C)-99.3 F (37.4 C)] 99.3 F (37.4 C) (07/03 0529) Pulse Rate:  [109-123] 113 (07/03 0529) Resp:  [20] 20 (07/03 0529) BP: (139-163)/(84-102) 142/87 (07/03 0529) SpO2:  [95 %-97 %] 97 % (07/03 0529) Last BM Date: 06/04/17  Intake/Output from previous day: 07/02 0701 - 07/03 0700 In: 5443.3 [P.O.:600; I.V.:3614.9; IV Piggyback:1228.3] Out: 1600 [Urine:1350; Emesis/NG output:250] Intake/Output this shift: No intake/output data recorded.  PE: Gen:  Alert, NAD, pleasant Card:  Sinus tachycardia, pedal pulses palpable  Pulm:  Normal effort, clear to auscultation bilaterally Abd: Soft, non-tender, non-distended, bowel sounds present, midline incision C/D/I. Skin: warm and dry, no rashes  Psych: A&Ox3   Lab Results:   Recent Labs  06/03/17 0419 06/04/17 0314  WBC 12.9* 9.9  HGB 9.1* 9.1*  HCT 27.9* 27.1*  PLT 237 231   BMET  Recent Labs  06/04/17 0314 06/05/17 0544  NA 135 133*  K 3.1* 3.0*  CL 110 106  CO2 20* 21*  GLUCOSE 156* 237*  BUN 15 13  CREATININE 0.84 0.70  CALCIUM 7.1* 7.1*   PT/INR No results for input(s): LABPROT, INR in the last 72 hours. CMP     Component Value Date/Time   NA 133 (L) 06/05/2017 0544   K 3.0 (L) 06/05/2017 0544   CL 106 06/05/2017 0544   CO2 21 (L) 06/05/2017 0544   GLUCOSE 237 (H) 06/05/2017 0544   BUN 13 06/05/2017 0544   CREATININE 0.70 06/05/2017 0544   CALCIUM 7.1 (L) 06/05/2017 0544   PROT 4.1 (L) 06/04/2017 0314   ALBUMIN 1.3 (L) 06/04/2017 0314   AST 19 06/04/2017 0314   ALT 12 (L) 06/04/2017 0314   ALKPHOS 93 06/04/2017 0314   BILITOT 0.7 06/04/2017 0314   GFRNONAA >60 06/05/2017 0544   GFRAA >60 06/05/2017 0544   Lipase      Component Value Date/Time   LIPASE 31 05/14/2017 2106       Studies/Results: Dg Chest Port 1 View  Result Date: 06/04/2017 CLINICAL DATA:  66 year old female with respiratory failure. Subsequent encounter. EXAM: PORTABLE CHEST 1 VIEW COMPARISON:  05/31/2017. FINDINGS: Right central line tip proximal right atrium level. Nasogastric tube courses below the diaphragm. Tip is not included on the present exam. Cardiomegaly. Pulmonary vascular congestion/mild pulmonary edema. Basilar atelectasis suspected. Infiltrate felt to be less likely consideration. Small pleural effusions suspected. Calcified aorta. No pneumothorax. IMPRESSION: Pulmonary vascular congestion/ mild pulmonary edema. Small pleural effusions suspected. Basilar subsegmental atelectasis. Cardiomegaly. Electronically Signed   By: Genia Del M.D.   On: 06/04/2017 07:02   Dg Abd Portable 1v  Result Date: 06/03/2017 CLINICAL DATA:  Nasogastric tube placement EXAM: PORTABLE ABDOMEN - 1 VIEW COMPARISON:  Portable exam 0943 hours compared to 05/29/2017 FINDINGS: Tip of nasogastric tube projects over proximal stomach. Gas and stool in transverse colon. Nonobstructive bowel gas pattern. No acute osseous findings. IMPRESSION: Tip of nasogastric tube projects over proximal stomach. Electronically Signed   By: Lavonia Dana M.D.   On: 06/03/2017 10:19    Anti-infectives: Anti-infectives    Start     Dose/Rate Route Frequency Ordered Stop   06/03/17 1200  vancomycin (VANCOCIN) IVPB 1000 mg/200 mL premix  Status:  Discontinued     1,000 mg 200 mL/hr  over 60 Minutes Intravenous Every 12 hours 06/03/17 1141 06/04/17 1105   06/02/17 1000  fluconazole (DIFLUCAN) IVPB 400 mg     400 mg 100 mL/hr over 120 Minutes Intravenous Every 24 hours 06/01/17 1017     06/01/17 1100  fluconazole (DIFLUCAN) IVPB 800 mg     800 mg 100 mL/hr over 240 Minutes Intravenous  Once 06/01/17 1017 06/01/17 1544   06/01/17 1100  vancomycin (VANCOCIN) IVPB 750 mg/150 ml  premix  Status:  Discontinued     750 mg 150 mL/hr over 60 Minutes Intravenous Every 12 hours 06/01/17 1051 06/03/17 1141   05/31/17 1145  vancomycin (VANCOCIN) IVPB 750 mg/150 ml premix     750 mg 150 mL/hr over 60 Minutes Intravenous NOW 05/31/17 1130 05/31/17 1258   05/31/17 0930  vancomycin (VANCOCIN) IVPB 750 mg/150 ml premix  Status:  Discontinued     750 mg 150 mL/hr over 60 Minutes Intravenous Every 12 hours 05/31/17 0911 05/31/17 1134   05/30/17 1800  piperacillin-tazobactam (ZOSYN) IVPB 3.375 g     3.375 g 12.5 mL/hr over 240 Minutes Intravenous Every 8 hours 05/30/17 1734 06/06/17 2159   05/30/17 1159  ceFAZolin (ANCEF) 2-4 GM/100ML-% IVPB    Comments:  Bridget Hartshorn   : cabinet override      05/30/17 1159 05/30/17 1259   05/30/17 0600  ceFAZolin (ANCEF) IVPB 2g/100 mL premix     2 g 200 mL/hr over 30 Minutes Intravenous On call to O.R. 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 Jana Hakim discussing patient wishes/prognosis. Patient wishes to proceed with chemotherapy and any therapy recommended by her oncologist that might prolong her life.  Anemia of chronic disease Seizure disorder - IV keppra HTN GERD  FEN - full liquid diet; TNA- could probably wean this over next 24-48h, hypokalemia (3.0), hypomagnesemia (Mg 1.4) - being replaced per primary team, prealbumin <5 ID - leukocytosis resolved; Ancef x 1 dose 6/27, Zosyn 6/28 >>, Vanc 6/28 >>, Diflucan 6/30 >>  VTE - SCD's, SQ heparin   LOS: 7 days    Jill Alexanders , [facility]  Surgery 06/05/2017, 8:04 AM Pager: [phone] Consults: [phone] Mon-Fri 7:00 am-4:30 pm Sat-Sun 7:00 am-11:30 am

## 2017-06-05 NOTE — Progress Notes (Signed)
PHARMACY - ADULT TOTAL PARENTERAL NUTRITION CONSULT NOTE   Pharmacy Consult for TPN Indication: bowel obstruction  Patient Measurements: Height: 5' 7"  (170.2 cm) Weight: 222 lb 10.6 oz (101 kg) IBW/kg (Calculated) : 61.6 TPN AdjBW (KG): 71.3 Body mass index is 34.87 kg/m.  Assessment:  80 yoF admitted on 6/26 with septic shock from an intra-abdominal abscess related to a recurrent SBO from metastatic adenocarcinoma.  S/p exp lap on 6/27 --   peritoneal nodule bx with drainage of abdominal abscess, repair of small bowel perforation, repair of small bowel enterotomy, and enterocolostomy performed.  Pharmacy is consulted to dose TPN.  Insulin Requirements:  Resistant SSI q4h - 23 units / 24 hrs  Current Nutrition: advanced to full liquid diet today  IVF: none  Central access: Implanted port. PICC placed 7/2. TPN start date: 7/1  Significant events:  6/27: Ex-lap 7/2: small BM overnight. NG tube d/c'ed. Clear liquids started.  7/3: advanced to full liquids  Today:   Glucose (goal < 150), No hx DM but HgbA1C of 6.6% (2017).  CBGs range 145-193.  Electrolytes - K+ low at 3, Mag low at 1.4, Na low at 133. Phos, CorrCa WNL.   Renal - SCr 0.7  LFTs - AST, Alk Phos, Tbili WNL. ALT low (7/2)  TGs - 80 (7/2)  Prealbumin - 8.3 (6/28), < 5 (7/2)  NUTRITIONAL GOALS                                                                                             RD recs:  Kcal:  2070-2350 (22-25 kcal/kg) Protein:  115-130 (1.2-1.4 grams/kg)  Clinimix 5/15 at a goal rate of 100 ml/hr + 20% IV fat emulsion at 20 ml/hr (x 12 hours/day)  to provide:120 g/day protein, 2184 Kcal/day.   PLAN                                                                                                                          Magnesuim Sulfate 2g IV x 1, KCl 70mq IV x 4 runs, and KCl 440m PO x 1 already ordered by MD this AM for low K+, Mag  At 1800 today:  Continue Clinimix E 5/15 to 70  ml/hr.  Will not advance at this time due to risk of refeeding syndrome, elevated CBGs, and advancement of diet to full liquids today.   20% IV fat emulsion at 20 ml/hr (x 12 hours/day).  Add 10 units regular insulin/24 hours to TPN bag.   TPN to contain standard multivitamins daily and trace elements only MWF due to [employer]   Continue  CBGs and resistant SSI q4h  TPN lab panels on Mondays & Thursdays.  BMET, Mag, Phos in AM.  F/u ability to tolerate diet.   Lindell Spar, PharmD, BCPS Pager: [phone] 06/05/2017 12:32 PM

## 2017-06-05 NOTE — Progress Notes (Signed)
Per MD to hold Miralax for 48hours, to see if diarrhea resolves. If diarrhea continues do a C-diff test in 48hours.

## 2017-06-05 NOTE — Progress Notes (Signed)
Please consult [place] for assistance with Advanced Directives.  Werner Lean LCSW [phone]

## 2017-06-05 NOTE — Progress Notes (Signed)
Pharmacy Antibiotic Note  Lori [name] is a 66 y.o. 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  She was recently hospitalized for SBO and discharged on 05/19/17.  She presented to the ED on 05/29/2017 with c/o abd pain with SBO noted on abd CT.  Patient undersent exp lap on 6/27 --   peritoneal nodule bx with drainage of abdominal abscess, repair of small bowel perforation, repair of small bowel enterotomy, and enterocolostomy performed.  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  06/05/2017:  Tmax 99.24F   WBC improved to WNL (7/2)  SCr trending down, now WNL with CrCl ~ 85 ml/min  Plan: - Continue Zosyn 3.375g IV q8h (infuse over 4 hours) - Continue Fluconazole 400mg  IV daily - Continue to monitor renal function, cultures, clinical course ___________________________  Height: 5\' 7"  (170.2 cm) Weight: 222 lb 10.6 oz (101 kg) IBW/kg (Calculated) : 61.6  Temp (24hrs), Avg:98.8 F (37.1 C), Min:98.4 F (36.9 C), Max:99.3 F (37.4 C)   Recent Labs Lab 05/31/17 0402 05/31/17 0520 05/31/17 1024 05/31/17 1620 06/01/17 0532 06/01/17 0935 06/01/17 1356 06/02/17 0428 06/03/17 0419 06/03/17 1000 06/04/17 0314 06/05/17 0544  WBC 8.5  --   --   --   --  15.7*  --  13.6* 12.9*  --  9.9  --   CREATININE 1.54*  --   --   --  1.30*  --   --  1.02* 0.93  --  0.84 0.70  LATICACIDVEN  --  2.6* 2.9* 3.4*  --  2.6* 1.9  --   --   --   --   --   VANCOTROUGH  --   --   --   --   --   --   --   --   --  11*  --   --   VANCORANDOM  --   --   --   --   --  8  --   --   --   --   --   --     Estimated Creatinine Clearance: 84.5 mL/min (by C-G formula based on SCr of 0.7 mg/dL).    Allergies  Allergen Reactions  . Contrast Media [Iodinated Diagnostic Agents] Itching and Swelling  . Dilantin [Phenytoin Sodium Extended] Itching, Swelling and Other (See Comments)    Whole body  . Latex Hives and Itching  . Adhesive  [Tape] Rash   Antimicrobials this admission:  6/27 Zosyn >> 6/28 Vanc >> 7/2 6/29 Fluconazole >>  Microbiology results:  6/28 BCx x 2: NGTD 6/28 UCx: NGF  Thank you for allowing pharmacy to be a part of this patient's care.   Lindell Spar, PharmD, BCPS Pager: [phone] 06/05/2017 12:39 PM

## 2017-06-06 LAB — GLUCOSE, CAPILLARY
GLUCOSE-CAPILLARY: 157 mg/dL — AB (ref 65–99)
GLUCOSE-CAPILLARY: 164 mg/dL — AB (ref 65–99)
GLUCOSE-CAPILLARY: 148 mg/dL — AB (ref 65–99)
GLUCOSE-CAPILLARY: 163 mg/dL — AB (ref 65–99)
Glucose-Capillary: 168 mg/dL — ABNORMAL HIGH (ref 65–99)

## 2017-06-06 LAB — BASIC METABOLIC PANEL
ANION GAP: 7 (ref 5–15)
BUN: 12 mg/dL (ref 6–20)
CALCIUM: 6.9 mg/dL — AB (ref 8.9–10.3)
CO2: 22 mmol/L (ref 22–32)
Chloride: 106 mmol/L (ref 101–111)
Creatinine, Ser: 0.63 mg/dL (ref 0.44–1.00)
GLUCOSE: 179 mg/dL — AB (ref 65–99)
POTASSIUM: 3.2 mmol/L — AB (ref 3.5–5.1)
Sodium: 135 mmol/L (ref 135–145)

## 2017-06-06 LAB — MAGNESIUM: Magnesium: 1.3 mg/dL — ABNORMAL LOW (ref 1.7–2.4)

## 2017-06-06 LAB — PHOSPHORUS: Phosphorus: 2.3 mg/dL — ABNORMAL LOW (ref 2.5–4.6)

## 2017-06-06 MED ORDER — POTASSIUM PHOSPHATES 15 MMOLE/5ML IV SOLN
10.0000 mmol | Freq: Once | INTRAVENOUS | Status: AC
  Administered 2017-06-06: 12:00:00 10 mmol via INTRAVENOUS
  Filled 2017-06-06: qty 3.33

## 2017-06-06 MED ORDER — ENSURE ENLIVE PO LIQD
237.0000 mL | Freq: Two times a day (BID) | ORAL | Status: DC
  Administered 2017-06-06 – 2017-06-07 (×2): 237 mL via ORAL

## 2017-06-06 MED ORDER — FAT EMULSION 20 % IV EMUL
240.0000 mL | INTRAVENOUS | Status: AC
  Administered 2017-06-06: 18:00:00 240 mL via INTRAVENOUS
  Filled 2017-06-06: qty 250

## 2017-06-06 MED ORDER — MAGNESIUM SULFATE 2 GM/50ML IV SOLN
2.0000 g | Freq: Once | INTRAVENOUS | Status: AC
  Administered 2017-06-06: 08:00:00 2 g via INTRAVENOUS
  Filled 2017-06-06: qty 50

## 2017-06-06 MED ORDER — CLINIMIX E/DEXTROSE (5/15) 5 % IV SOLN
INTRAVENOUS | Status: AC
  Administered 2017-06-06: 18:00:00 via INTRAVENOUS
  Filled 2017-06-06: qty 960

## 2017-06-06 MED ORDER — POTASSIUM CHLORIDE 10 MEQ/100ML IV SOLN
10.0000 meq | INTRAVENOUS | Status: AC
  Administered 2017-06-06 (×4): 10 meq via INTRAVENOUS
  Filled 2017-06-06 (×4): qty 100

## 2017-06-06 MED ORDER — MAGNESIUM SULFATE 2 GM/50ML IV SOLN: 2.0000 g | Freq: Once | INTRAVENOUS | Status: DC

## 2017-06-06 NOTE — Progress Notes (Signed)
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  Anemia of chronic disease Seizure disorder - IV keppra HTN GERD  FEN - tolerating full liquid diet; TNA- could probably wean this over next 24-48h, hypokalemia (3.2), hypomagnesemia (Mg 1.3) likely secondary to diarrhea as a result of bypass; prealbumin <5 ID - leukocytosis resolved; Ancef x 1 dose 6/27, Zosyn 6/28 >>, Vanc 6/28 >>, Diflucan 6/30 >> VTE - SCD's, SQ heparin  Plan:  Advance to soft diet.  Ensure supplements. Stop abxs. KCl and Magnesium replacement ordered.   LOS: 8 days     7 Days Post-Op  Chief Complaint/Subjective: No complaints.   Objective: Vital signs in last 24 hours: Temp:  [98.8 F (37.1 C)-99.5 F (37.5 C)] 99.3 F (37.4 C) (07/04 0534) Pulse Rate:  [84-114] 84 (07/04 0534) Resp:  [20] 20 (07/04 0534) BP: (149-162)/(89-91) 162/89 (07/04 0534) SpO2:  [94 %-98 %] 96 % (07/04 0534) Last BM Date: 06/05/17  Intake/Output from previous day: 07/03 0701 - 07/04 0700 In: 3615.7 [P.O.:1320; I.V.:1735.7; IV Piggyback:560] Out: 9563 [Urine:1675] Intake/Output this shift: No intake/output data recorded.  PE: General- In NAD. Some increase in work of breathing.  Awake and alert. Abdomen-soft, VAC on.  Lab Results:   Recent Labs  06/04/17 0314  WBC 9.9  HGB 9.1*  HCT 27.1*  PLT 231   BMET  Recent Labs  06/05/17 0544 06/06/17 0549  NA 133* 135  K 3.0* 3.2*  CL 106 106  CO2 21* 22  GLUCOSE 237* 179*  BUN 13 12  CREATININE 0.70 0.63  CALCIUM 7.1* 6.9*   PT/INR No results for input(s): LABPROT, INR in the last 72  hours. Comprehensive Metabolic Panel:    Component Value Date/Time   NA 135 06/06/2017 0549   NA 133 (L) 06/05/2017 0544   K 3.2 (L) 06/06/2017 0549   K 3.0 (L) 06/05/2017 0544   CL 106 06/06/2017 0549   CL 106 06/05/2017 0544   CO2 22 06/06/2017 0549   CO2 21 (L) 06/05/2017 0544   BUN 12 06/06/2017 0549   BUN 13 06/05/2017 0544   CREATININE 0.63 06/06/2017 0549   CREATININE 0.70 06/05/2017 0544   GLUCOSE 179 (H) 06/06/2017 0549   GLUCOSE 237 (H) 06/05/2017 0544   CALCIUM 6.9 (L) 06/06/2017 0549   CALCIUM 7.1 (L) 06/05/2017 0544   AST 19 06/04/2017 0314   AST 21 05/29/2017 0242   ALT 12 (L) 06/04/2017 0314   ALT 23 05/29/2017 0242   ALKPHOS 93 06/04/2017 0314   ALKPHOS 68 05/29/2017 0242   BILITOT 0.7 06/04/2017 0314   BILITOT 0.4 05/29/2017 0242   PROT 4.1 (L) 06/04/2017 0314   PROT 5.8 (L) 05/29/2017 0242   ALBUMIN 1.3 (L) 06/04/2017 0314   ALBUMIN 2.8 (L) 05/29/2017 0242     Studies/Results: No results found.  Anti-infectives: Anti-infectives    Start     Dose/Rate Route Frequency Ordered Stop   06/03/17 1200  vancomycin (VANCOCIN) IVPB 1000 mg/200 mL premix  Status:  Discontinued     1,000 mg 200 mL/hr over 60 Minutes Intravenous Every 12 hours 06/03/17 1141 06/04/17 1105   06/02/17 1000  fluconazole (DIFLUCAN) IVPB 400 mg     400  mg 100 mL/hr over 120 Minutes Intravenous Every 24 hours 06/01/17 1017     06/01/17 1100  fluconazole (DIFLUCAN) IVPB 800 mg     800 mg 100 mL/hr over 240 Minutes Intravenous  Once 06/01/17 1017 06/01/17 1544   06/01/17 1100  vancomycin (VANCOCIN) IVPB 750 mg/150 ml premix  Status:  Discontinued     750 mg 150 mL/hr over 60 Minutes Intravenous Every 12 hours 06/01/17 1051 06/03/17 1141   05/31/17 1145  vancomycin (VANCOCIN) IVPB 750 mg/150 ml premix     750 mg 150 mL/hr over 60 Minutes Intravenous NOW 05/31/17 1130 05/31/17 1258   05/31/17 0930  vancomycin (VANCOCIN) IVPB 750 mg/150 ml premix  Status:  Discontinued     750  mg 150 mL/hr over 60 Minutes Intravenous Every 12 hours 05/31/17 0911 05/31/17 1134   05/30/17 1800  piperacillin-tazobactam (ZOSYN) IVPB 3.375 g     3.375 g 12.5 mL/hr over 240 Minutes Intravenous Every 8 hours 05/30/17 1734 06/06/17 2159   05/30/17 1159  ceFAZolin (ANCEF) 2-4 GM/100ML-% IVPB    Comments:  Bridget Hartshorn   : cabinet override      05/30/17 1159 05/30/17 1259   05/30/17 0600  ceFAZolin (ANCEF) IVPB 2g/100 mL premix     2 g 200 mL/hr over 30 Minutes Intravenous On call to O.R. 05/29/17 1430 05/30/17 1259       Lori [name] J 06/06/2017

## 2017-06-06 NOTE — Progress Notes (Signed)
Plan for d/c to SNF, discharge planning per CSW. [phone] 

## 2017-06-06 NOTE — Progress Notes (Signed)
PROGRESS NOTE    Lori [name]  POE:[id number] DOB: 11-27-1951 DOA: 05/29/2017 PCP: [name], MD    Brief Narrative:  66 year old female who presented with abdominal pain. Patient is known to have cervical cancer status post chemoradiation 2017, recent hospitalization for small bowel obstruction. Patient develop recurrent abdominal pain, worsening, associated with abdominal distention and no bowel movements for 4 days, positive nausea and generalized weakness. On initial physical examination blood pressure 103/79, 76/50, heart rate 90, respiratory rate 20, oxygen saturation 95%, temperature 98.3. Moist mucous membranes, lungs clear to auscultation bilaterally, heart S1-S2 present rhythmic, abdomen distended, decreased bowel sounds, no guarding or rigidity, no lower extremity edema. Patient admitted with a working diagnosis of small bowel obstruction. She was found to have intra-abdominal abscesses, and septic shock secondary to metastatic adenocarcinoma per appendix. Underwent laparotomy and drainage of intra-abdominal abscesses, repair of small bowel perforation with enterocolostomy (06/27).    Assessment & Plan:   Principal Problem:   SBO (small bowel obstruction) (HCC) Active Problems:   Cervical cancer, FIGO stage IIB (HCC)   HTN (hypertension)   Seizure (HCC)   Severe sepsis with septic shock ([place])   Peritoneal carcinomatosis ([place])  1. Septic shock due to intra-abdominal abscesses. Antibiotics held per surgery recommendations, will continue close monitoring, of cell count, and temperature curve. Blood pressure systolic 315'Q. No signs of volume overload, will continue TPN.    2. Mucinous adenocarcinoma of the appendix, stage IV with recurrent peritoneal metastasis. Plan for palliative chemotherapy once patient is recovered from current decompensation.   3. Anemia of chronic disease. Hb and hct stable, no signs of active bleeding.   4. Hypokalemia. Will continue to replete K  with Kcl, serum k at 3,2 with target of 4, Mg at 1,3 with target at 2, preserved renal function with cr at 0,63. TPN with 50 meq of K and had extra 40 meq IV.    5. Hypertension. Continue metoprolol 50 mg bid, for blood pressure control.   6. Seizure disorder. No signs of active seizure, continue keppra IV bid, neuro checks per unit protocol. Patient today somnolent but able to follow commands and easy to arouse.   7. T2dm. Continue insulin sliding scale for glucose cover and monitoring. Capillary glucose 202, 176, 157, 164, 148.   DVT prophylaxis: enoxaparin  Code Status: full  Family Communication:  Disposition Plan:    Consultants:   Surgery  Oncology  Critical Care   Procedures:    05/30/2017   1. 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 2.  Exploratory laparotomy, drainage of abdominal abscess, repair of small bowel perforation, repair of small bowel enterotomy, enterocolostomy (mid ileum to mid transverse colon).   Antimicrobials:      Subjective: Patient somnolent, but easy to arouse, positive abdominal discomfort, no nausea or vomiting, no dyspnea or chest pain. Limited history due to somnolence.   Objective: Vitals:   06/05/17 2215 06/05/17 2350 06/06/17 0310 06/06/17 0534  BP: (!) 149/91   (!) 162/89  Pulse: 91 100 85 84  Resp:  20 20 20   Temp:  98.8 F (37.1 C)  99.3 F (37.4 C)  TempSrc:  Oral  Oral  SpO2:  98% 94% 96%  Weight:      Height:        Intake/Output Summary (Last 24 hours) at 06/06/17 1152 Last data filed at 06/06/17 0650  Gross per 24 hour  Intake  3150.67 ml  Output             1675 ml  Net          1475.67 ml   Filed Weights   05/29/17 0531 05/31/17 0840 06/01/17 0530  Weight: 94.1 kg (207 lb 7.3 oz) 100.4 kg (221 lb 5.5 oz) 101 kg (222 lb 10.6 oz)    Examination:  General exam: deconditioned and ill looking appearing E ENT: mild pallor, oral  mucosa dry, no icterus.  Respiratory system: Decreased breath sounds at bases due to poor inspiratory effort, no wheezing, rales or rhonchi.  Cardiovascular system: S1 & S2 heard, RRR. No JVD, murmurs, rubs, gallops or clicks. No pedal edema. Gastrointestinal system: Abdomen is  distended, soft and nontender. No organomegaly or masses felt. Normal bowel sounds heard. Central nervous system: Alert and oriented. No focal neurological deficits. Extremities: Symmetric 5 x 5 power. Skin: No rashes, lesions or ulcers     Data Reviewed: I have personally reviewed following labs and imaging studies  CBC:  Recent Labs Lab 05/31/17 0402 06/01/17 0935 06/02/17 0428 06/03/17 0419 06/04/17 0314  WBC 8.5 15.7* 13.6* 12.9* 9.9  NEUTROABS  --   --   --   --  8.7*  HGB 12.4 11.2* 10.1* 9.1* 9.1*  HCT 38.6 34.1* 30.6* 27.9* 27.1*  MCV 89.1 89.3 88.2 86.9 87.4  PLT 334 334 256 237 578   Basic Metabolic Panel:  Recent Labs Lab 06/02/17 0428 06/03/17 0419 06/04/17 0314 06/05/17 0544 06/06/17 0549  NA 131* 135 135 133* 135  K 3.6 3.5 3.1* 3.0* 3.2*  CL 106 108 110 106 106  CO2 20* 20* 20* 21* 22  GLUCOSE 159* 184* 156* 237* 179*  BUN 22* 19 15 13 12   CREATININE 1.02* 0.93 0.84 0.70 0.63  CALCIUM 6.9* 7.0* 7.1* 7.1* 6.9*  MG  --  1.6* 1.7 1.4* 1.3*  PHOS  --  2.2* 2.5 2.7 2.3*   GFR: Estimated Creatinine Clearance: 84.5 mL/min (by C-G formula based on SCr of 0.63 mg/dL). Liver Function Tests:  Recent Labs Lab 06/04/17 0314  AST 19  ALT 12*  ALKPHOS 93  BILITOT 0.7  PROT 4.1*  ALBUMIN 1.3*   No results for input(s): LIPASE, AMYLASE in the last 168 hours. No results for input(s): AMMONIA in the last 168 hours. Coagulation Profile: No results for input(s): INR, PROTIME in the last 168 hours. Cardiac Enzymes:  Recent Labs Lab 06/01/17 0935 06/02/17 0428  TROPONINI 0.06* 0.04*   BNP (last 3 results) No results for input(s): PROBNP in the last 8760 hours. HbA1C: No  results for input(s): HGBA1C in the last 72 hours. CBG:  Recent Labs Lab 06/05/17 1715 06/05/17 2006 06/05/17 2357 06/06/17 0338 06/06/17 0755  GLUCAP 156* 202* 176* 157* 164*   Lipid Profile:  Recent Labs  06/04/17 0314  TRIG 80   Thyroid Function Tests: No results for input(s): TSH, T4TOTAL, FREET4, T3FREE, THYROIDAB in the last 72 hours. Anemia Panel: No results for input(s): VITAMINB12, FOLATE, FERRITIN, TIBC, IRON, RETICCTPCT in the last 72 hours. Sepsis Labs:  Recent Labs Lab 05/31/17 1024 05/31/17 1620 06/01/17 0935 06/01/17 1356  LATICACIDVEN 2.9* 3.4* 2.6* 1.9    Recent Results (from the past 240 hour(s))  Urine Culture     Status: None   Collection Time: 05/31/17 10:50 AM  Result Value Ref Range Status   Specimen Description URINE, CATHETERIZED  Final   Special Requests NONE  Final   Culture  Final    NO GROWTH Performed at [facility], [place] [address] [place], [place] [address]    Report Status 06/01/2017 FINAL  Final  Culture, blood (Routine X 2) w Reflex to ID Panel     Status: None   Collection Time: 05/31/17 12:10 PM  Result Value Ref Range Status   Specimen Description BLOOD RIGHT HAND  Final   Special Requests IN PEDIATRIC BOTTLE Blood Culture adequate volume  Final   Culture   Final    NO GROWTH 5 DAYS Performed at [facility], [place] [address] [place], [place] [address]    Report Status 06/05/2017 FINAL  Final  Culture, blood (Routine X 2) w Reflex to ID Panel     Status: None   Collection Time: 05/31/17  2:34 PM  Result Value Ref Range Status   Specimen Description BLOOD RIGHT HAND  Final   Special Requests IN PEDIATRIC BOTTLE Blood Culture adequate volume  Final   Culture   Final    NO GROWTH 5 DAYS Performed at [facility], [place] [address], [place], [place] [address]    Report Status 06/05/2017 FINAL  Final         Radiology Studies: No results found.      Scheduled Meds: . aspirin  81 mg Oral  Daily  . chlorhexidine  15 mL Mouth Rinse BID  . Chlorhexidine Gluconate Cloth  6 each Topical Q0600  . feeding supplement (ENSURE ENLIVE)  237 mL Oral BID BM  . heparin subcutaneous  5,000 Units Subcutaneous Q8H  . insulin aspart  0-20 Units Subcutaneous Q4H  . mouth rinse  15 mL Mouth Rinse q12n4p  . metoprolol tartrate  50 mg Oral BID  . pantoprazole (PROTONIX) IV  40 mg Intravenous Q24H   Continuous Infusions: . Marland KitchenTPN (CLINIMIX-E) Adult     And  . fat emulsion    . fluconazole (DIFLUCAN) IV Stopped (06/05/17 1435)  . levETIRAcetam 500 mg (06/06/17 0937)  . potassium chloride 10 mEq (06/06/17 1036)  . potassium phosphate IVPB (mmol)    . sodium chloride    . Marland KitchenTPN (CLINIMIX-E) Adult 70 mL/hr at 06/05/17 1743     LOS: 8 days      Mauricio Gerome Apley, MD [facility] Pager [phone]  If 7PM-7AM, please contact night-coverage [email or web address] Password [email or web address] 06/06/2017, 11:52 AM

## 2017-06-06 NOTE — Progress Notes (Signed)
PHARMACY - ADULT TOTAL PARENTERAL NUTRITION CONSULT NOTE   Pharmacy Consult for TPN Indication: bowel obstruction  Patient Measurements: Height: 5' 7"  (170.2 cm) Weight: 222 lb 10.6 oz (101 kg) IBW/kg (Calculated) : 61.6 TPN AdjBW (KG): 71.3 Body mass index is 34.87 kg/m.  Assessment:  78 yoF admitted on 6/26 with septic shock from an intra-abdominal abscess related to a recurrent SBO from metastatic adenocarcinoma.  S/p exp lap on 6/27 --   peritoneal nodule bx with drainage of abdominal abscess, repair of small bowel perforation, repair of small bowel enterotomy, and enterocolostomy performed.  Pharmacy is consulted to dose TPN.  Insulin Requirements:  Resistant SSI q4h - 27 units / 24 hrs  Current Nutrition: advanced to full liquid diet today  IVF: none  Central access: Implanted port. PICC placed 7/2. TPN start date: 7/1  Significant events:  6/27: Ex-lap 7/2: small BM overnight. NG tube d/c'ed. Clear liquids started.  7/3: advanced to full liquids 7/4: adv to soft diet  Today:   Glucose (goal < 150):  No hx DM but HgbA1C of 6.6% (2017).  CBGs 157-190  Electrolytes:  K+ low at 3.2, Mag low at 1.3, Phos slightly low at 2.3, Na improved to wnl. CorrCa WNL.   Renal:  SCr wnl  LFTs:  AST, Alk Phos, Tbili WNL. ALT low (7/2)  TGs:  80 (7/2)  Prealbumin:  8.3 (6/28), < 5 (7/2)  NUTRITIONAL GOALS                                                                                             RD recs:  Kcal:  2070-2350 (22-25 kcal/kg) Protein:  115-130 (1.2-1.4 grams/kg)  Clinimix 5/15 at a goal rate of 100 ml/hr + 20% IV fat emulsion at 20 ml/hr (x 12 hours/day)  to provide:120 g/day protein, 2184 Kcal/day.   PLAN                                                                                                                          Now:  - potassium chloride 10 mEq IV x4 runs - magnesium sulfate 2gm IV x1 - potassium phosphate 10 mmol IV x1  At 1800 today:  Spoke  to Dr. Barry Dienes, ok to wean TPN by half today as patient's tolerating diet. Reduce Clinimix E 5/15 rate to 40 ml/hr.  20% IV fat emulsion at 20 ml/hr (x 12 hours/day).  Continue insulin to 10 units in TPN bag.   TPN to contain standard multivitamins daily.  Patient tolerating diet so will not add  trace elements today  Continue CBGs and resistant SSI  q4h  TPN lab panels on Mondays & Thursdays.  BMET, Mag, Phos in AM.  F/u ability to tolerate diet.   Dia Sitter, PharmD, BCPS 06/06/2017 7:00 AM

## 2017-06-07 DIAGNOSIS — C772 Secondary and unspecified malignant neoplasm of intra-abdominal lymph nodes: Secondary | ICD-10-CM

## 2017-06-07 LAB — GLUCOSE, CAPILLARY
GLUCOSE-CAPILLARY: 153 mg/dL — AB (ref 65–99)
GLUCOSE-CAPILLARY: 167 mg/dL — AB (ref 65–99)
Glucose-Capillary: 177 mg/dL — ABNORMAL HIGH (ref 65–99)
Glucose-Capillary: 163 mg/dL — ABNORMAL HIGH (ref 65–99)
Glucose-Capillary: 118 mg/dL — ABNORMAL HIGH (ref 65–99)
Glucose-Capillary: 148 mg/dL — ABNORMAL HIGH (ref 65–99)

## 2017-06-07 LAB — COMPREHENSIVE METABOLIC PANEL WITH GFR
ALT: 9 U/L — ABNORMAL LOW (ref 14–54)
AST: 16 U/L (ref 15–41)
Albumin: 1.2 g/dL — ABNORMAL LOW (ref 3.5–5.0)
Alkaline Phosphatase: 137 U/L — ABNORMAL HIGH (ref 38–126)
Anion gap: 3 — ABNORMAL LOW (ref 5–15)
BUN: 11 mg/dL (ref 6–20)
CO2: 25 mmol/L (ref 22–32)
Calcium: 7.1 mg/dL — ABNORMAL LOW (ref 8.9–10.3)
Chloride: 104 mmol/L (ref 101–111)
Creatinine, Ser: 0.6 mg/dL (ref 0.44–1.00)
GFR calc Af Amer: >60 mL/min
GFR calc non Af Amer: >60 mL/min
Glucose, Bld: 191 mg/dL — ABNORMAL HIGH (ref 65–99)
Potassium: 3.6 mmol/L (ref 3.5–5.1)
Sodium: 132 mmol/L — ABNORMAL LOW (ref 135–145)
Total Bilirubin: 0.2 mg/dL — ABNORMAL LOW (ref 0.3–1.2)
Total Protein: 4.5 g/dL — ABNORMAL LOW (ref 6.5–8.1)

## 2017-06-07 LAB — PHOSPHORUS: PHOSPHORUS: 2.7 mg/dL (ref 2.5–4.6)

## 2017-06-07 LAB — MAGNESIUM: Magnesium: 1.5 mg/dL — ABNORMAL LOW (ref 1.7–2.4)

## 2017-06-07 MED ORDER — CARBAMAZEPINE 100 MG PO CHEW
100.0000 mg | CHEWABLE_TABLET | Freq: Three times a day (TID) | ORAL | Status: DC
  Administered 2017-06-07 – 2017-06-08 (×3): 100 mg via ORAL
  Filled 2017-06-07 (×4): qty 1

## 2017-06-07 MED ORDER — LORAZEPAM 0.5 MG PO TABS
0.5000 mg | ORAL_TABLET | Freq: Three times a day (TID) | ORAL | Status: DC | PRN
  Administered 2017-06-08: 09:00:00 0.5 mg via ORAL
  Filled 2017-06-07: qty 1

## 2017-06-07 MED ORDER — LEVETIRACETAM 500 MG PO TABS
750.0000 mg | ORAL_TABLET | Freq: Two times a day (BID) | ORAL | Status: DC
  Filled 2017-06-07: qty 1

## 2017-06-07 MED ORDER — ENSURE ENLIVE PO LIQD
237.0000 mL | Freq: Two times a day (BID) | ORAL | Status: DC
  Administered 2017-06-07 – 2017-06-08 (×3): 237 mL via ORAL

## 2017-06-07 MED ORDER — FAMOTIDINE 20 MG PO TABS
20.0000 mg | ORAL_TABLET | Freq: Every day | ORAL | Status: DC
  Administered 2017-06-07 – 2017-06-08 (×2): 20 mg via ORAL
  Filled 2017-06-07 (×2): qty 1

## 2017-06-07 MED ORDER — MAGNESIUM SULFATE IN D5W 1-5 GM/100ML-% IV SOLN
1.0000 g | Freq: Once | INTRAVENOUS | Status: AC
  Administered 2017-06-07: 1 g via INTRAVENOUS
  Filled 2017-06-07: qty 100

## 2017-06-07 MED ORDER — POTASSIUM CHLORIDE 20 MEQ/15ML (10%) PO SOLN
20.0000 meq | Freq: Once | ORAL | Status: AC
  Administered 2017-06-07: 20 meq via ORAL
  Filled 2017-06-07: qty 15

## 2017-06-07 MED ORDER — BOOST / RESOURCE BREEZE PO LIQD
1.0000 | Freq: Two times a day (BID) | ORAL | Status: DC
  Administered 2017-06-07 – 2017-06-08 (×2): 1 via ORAL

## 2017-06-07 NOTE — Progress Notes (Addendum)
PHARMACY - ADULT TOTAL PARENTERAL NUTRITION CONSULT NOTE   Pharmacy Consult for TPN Indication: bowel obstruction  Patient Measurements: Height: 5' 7"  (170.2 cm) Weight: 222 lb 10.6 oz (101 kg) IBW/kg (Calculated) : 61.6 TPN AdjBW (KG): 71.3 Body mass index is 34.87 kg/m.  Assessment:  73 yoF admitted on 6/26 with septic shock from an intra-abdominal abscess related to a recurrent SBO from metastatic adenocarcinoma.  S/p exp lap on 6/27 --   peritoneal nodule bx with drainage of abdominal abscess, repair of small bowel perforation, repair of small bowel enterotomy, and enterocolostomy performed.  Pharmacy is consulted to dose TPN.  Insulin Requirements:  Resistant SSI q4h - 22 units/24 hrs + 10 units regular insulin in TPN bag  Current Nutrition: soft diet  IVF: none  Central access: Implanted port. PICC placed 7/2. TPN start date: 7/1  Significant events:  6/27: Ex-lap 7/2: small BM overnight. NG tube d/c'ed. Clear liquids started.  7/3: advanced to full liquids 7/4: advanced to soft diet  Today:   Glucose (goal < 150):  No hx DM but HgbA1C of 6.6% (2017).  CBGs 148-177.  Electrolytes:  Sodium low at 132, Magnesium low at 1.5. K+ on low end of normal limits. All others, including Corrected Calcium, now WNL.    Renal:  SCr WNL  LFTs:  AST WNL. ALT, Tbili low. Alk Phos increased to 137  TGs:  80 (7/2)  Prealbumin:  8.3 (6/28), < 5 (7/2)  NUTRITIONAL GOALS                                                                                             RD recs:  Kcal:  2070-2350 (22-25 kcal/kg) Protein:  115-130 (1.2-1.4 grams/kg)  Clinimix 5/15 at a goal rate of 100 ml/hr + 20% IV fat emulsion at 20 ml/hr (x 12 hours/day)  to provide:120 g/day protein, 2184 Kcal/day.   PLAN                                                                                                                           Magnesium Sulfate 1g IV x 1 this AM   KCl 28mq PO x 1 ordered per  MD.  SDamaris Schoonerto Dr. ACathlean Sauer ok to wean TPN to off today, as patient is tolerating diet.  Reduce Clinimix E 5/15 rate to 20 ml/hr now and discontinue at 1800 today.  D/C TPN labs.   Continue CBGs and SSI q4h per Dr. ACathlean Sauer    JLindell Spar PharmD, BCPS Pager: [phone]/04/2017 11:49 AM

## 2017-06-07 NOTE — Progress Notes (Signed)
Nutrition Follow-up  DOCUMENTATION CODES:   Obesity unspecified  INTERVENTION:  - Continue Ensure BID. - Will order Boost Breeze BID, each supplement provides 250 kcal and 9 grams of protein - Continue to encourage PO intakes of meals and supplements. - Potential d/c of TPN.  - RD will continue to monitor for additional needs.   NUTRITION DIAGNOSIS:   Inadequate oral intake related to inability to eat as evidenced by NPO status. -diet advanced with variable intakes.   GOAL:   Patient will meet greater than or equal to 90% of their needs -met with TPN regimen alone.   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  S/p exp lap on 6/27 --   peritoneal nodule bx with drainage of abdominal abscess, repair of small bowel perforation, repair of small bowel enterotomy, and enterocolostomy performed.  NG tube remains in place and on bowel rest.    7/5 No new weight since 6/29. Diet advanced from CLD to [place] 7/3 at 0930 and from [place] to Soft 7/4 @ 0800. Per doc flow sheet, pt consumed 50% of dinner last night (350 kcal and 12 grams of protein), 50% of breakfast this AM (230 kcal and 6 grams of protein), and 5% of lunch today (15 kcal and <0.5 grams of protein). Pt ordered Ensure Enlive BID yesterday and drank 1 yesterday and 1 today (each 350 kcal and 20 grams of protein).   Per Surgeon's note this AM, possible d/c of TPN if PO intakes are adequate. Pt currently receiving Clinimix E 5/15 @ 20 mL/hr with 20% ILE @ 20 mL/hr x12 hours. This regimen is providing 24 grams of protein and 780 kcal.   Medications reviewed; 20 mg Pepcid/day, sliding scale Novolog, 1 g IV Mg sulfate x1 run today, 10 mmol IV KPhos x1 run yesterday. Labs reviewed; CBGs: 153-177 mg/dL today, Na: 132 mmol/L, Ca: 7.1 mg/dL, Mg: 1.5 mg/dL.    7/3 -  Pt remains on CLD. NGT removed yesterday.  - Pt receiving Clinimix E 5/15 @ 70 mL/hr with 20% ILE @ 20 mL/hr x12 hours which is providing 84 kcal (73% minimum estimated protein need) and 1673 kcal (81% minimum estimated kcal need).  - Pharmacy note yesterday states goal for TPN will be Clinimix E 5/15 @ 100 mL/hr with 20% ILE @ 20 mL/hr x12 hours. - This regimen will provide 120 grams of protein and 2184 kcal.  - Recommend goal rate of Clinimix E 5/15 @ 83 mL/hr (2L/day) with 20% ILE @ 20 mL/hr x12 hours to provide 1894 kcal (91% minimum estimated kcal need) and 100 grams of protein (87% minimum estimated protein need).  Do not feel that TPN regimen needs to meet >90% estimated needs d/t CLD, ability to add oral nutrition supplements, and hopeful for diet advancement with good tolerance of CLD yesterday and ability to remove NGT. Do not recommend advancing TPN today, wait until tomorrow given current K and Mg.  K: 3 mmol/L, Mg: 1.4 mg/dL    7/2 - Medical dx and course outlined above and is from Pharmacy note yesterday AM.  - NGT remains in place this AM and is currently clamped.  - Diet advanced from NPO to CLD today at 0823 and pt reports 100% completion of chicken broth, coffee, jello, and grape juice and that she saved ginger ale to  sip on throughout the AM.  - She denies abdominal pain or nausea prior to or following intakes of clears. - Pt reports that for >1 week PTA she was experiencing abdominal pain, no N/V, which caused decreased appetite and decreased desire to eat even favorite foods.  - Physical assessment shows no muscle and no fat wasting.  - Per chart review, pt has gained 15 lbs (6.9 kg) since admission; estimated needs based on admission weight of 207 lbs (94.1 kg).  - Pt unsure of UBW or weight hx recently.  - Per chart review, she lost 6 lbs (2.8% body weight) in the 3 weeks PTA which is not significant for time frame. - Unable to state malnutrition based on available  information and ASPEN guidelines.  - She is currently receiving Clinimix E 5/15 @ 40 mL/hr without ILE via R-sided Port-a-cath.  - This regimen is providing 682 kcal and 48 grams of protein.   Recommend goal: Clinimix E 5/15 @ 83 mL/hr with 20% ILE @ 12 mL/hr x12 hours to provide 1894 kcal (91% minimum estimated kcal need) and 100 grams of protein (87% minimum estimated protein need).  Palliative consult in this AM for GOC.  K: 3.1 mmol/L IVF:D5-NS @ 85 mL/hr (347 kcal from dextrose).   Diet Order:  DIET SOFT Room service appropriate? Yes; Fluid consistency: Thin .TPN (CLINIMIX-E) Adult  Skin:  Wound (see comment) (Abdominal incision from 05/30/17)  Last BM:  7/5  Height:   Ht Readings from Last 1 Encounters:  05/31/17 5' 7"  (1.702 m)    Weight:   Wt Readings from Last 1 Encounters:  06/01/17 222 lb 10.6 oz (101 kg)    Ideal Body Weight:  61.36 kg  BMI:  Body mass index is 34.87 kg/m.  Estimated Nutritional Needs:   Kcal:  2070-2350 (22-25 kcal/kg)  Protein:  115-130 (1.2-1.4 grams/kg)  Fluid:  >/= 2.1 L/day  EDUCATION NEEDS:   No education needs identified at this time    Jarome Matin, MS, RD, LDN, CNSC Inpatient Clinical Dietitian Pager # [phone] After hours/weekend pager # [phone]

## 2017-06-07 NOTE — Progress Notes (Signed)
Wound Vac dressing change done per [facility] nurse recommendations. Followed dressing change listed under [place] note from 06/05/17.   Suction was achieved after new dressing placed. Time and date noted on dressing at time of change.   Patient tolerated dressing change well.

## 2017-06-07 NOTE — Progress Notes (Signed)
CSW assisting with d/c planning. Pt / son have chosen [facility] for SNF placement. SNF contacted and is able to accept pt once TNA has been d/c and [employer] Medicare provides authorization. Clinicals sent to [facility] for SNF authorization. RNCM ( [employer] Medicare ) contacted CSW reporting that pt does not look stable for dc and will not provide authorization today for possible dc tomorrow. CSW will contact insurance tomorrow with a medical update. CSW will continue to follow to assist with d/c planning.  Werner Lean LCSW [phone]

## 2017-06-07 NOTE — Progress Notes (Signed)
Physical Therapy Treatment Patient Details Name: Lori [name] MRN: [id number] DOB: 05/15/1951 Today's Date: 06/07/2017    History of Present Illness Lori [name] is a 66 y.o. female with medical history significant of cervical cancer and endometrial carcinoma , admitted 05/29/17 for   [place] OF INTRA ABDOMINAL ABSCESS OF SMALL BOWEL PERFORATION WITH ENTEROCOLOSTOMY and  S/p LAPAROSCOPY DIAGNOSTIC WITH PERITONEAL BIOPSY on 05/29/17    PT Comments    The patient is more lethargic today. PT had goals to assist patient to recliner with mechanical lift but due to lethargy and BM incontinence, will delay the activity to another day. Patient appears to have dyspnea, O2 sats92%.  Continue PT while in acute care.    Follow Up Recommendations  SNF;Supervision/Assistance - 24 hour     Equipment Recommendations  Wheelchair (measurements PT)    Recommendations for Other Services       Precautions / Restrictions Precautions Precautions: Fall Precaution Comments: open abdominal wound with VAC, frequent BM, profoundly weak    Mobility  Bed Mobility Overal bed mobility: Needs Assistance Bed Mobility: Rolling;Sidelying to Sit;Sit to Sidelying Rolling: Total assist;Max assist;+2 for safety/equipment Sidelying to sit: Total assist;+2 for safety/equipment;+2 for physical assistance;HOB elevated     Sit to sidelying: Total assist;+2 for physical assistance;+2 for safety/equipment General bed mobility comments: patient requires extensive assist to roll to each side. Today decreased self assist with reaching for the bed rails. . Assist with trunk and legs for all mobility. Much edema about the thighs and trunk limiting  mobility. Less aroused today .  Transfers                 General transfer comment: unable, does not hold balance in sitting.  Ambulation/Gait                 Stairs            Wheelchair Mobility    Modified  [place] (Stroke Patients Only)       Balance Overall balance assessment: Needs assistance Sitting-balance support: Bilateral upper extremity supported;Feet supported Sitting balance-Leahy Scale: Poor Sitting balance - Comments: patient did sit for a few seconds without external assistance.                                    Cognition Arousal/Alertness: Lethargic Behavior During Therapy: Anxious Overall Cognitive Status: Impaired/Different from baseline Area of Impairment: Following commands                               General Comments: patient is very lethargic, not staying alert , RN reports that patient had ativan overnight.      Exercises      General Comments        Pertinent Vitals/Pain Pain Assessment: Faces Faces Pain Scale: Hurts whole lot Pain Location: abdomen Pain Descriptors / Indicators: Aching Pain Intervention(s): Patient requesting pain meds-RN notified    Home Living                      Prior Function            PT Goals (current goals can now be found in the care plan section) Progress towards PT goals: Progressing toward goals    Frequency    Min 3X/week      PT Plan  Co-evaluation              AM-PAC PT "6 Clicks" Daily Activity  Outcome Measure  Difficulty turning over in bed (including adjusting bedclothes, sheets and blankets)?: Total Difficulty moving from lying on back to sitting on the side of the bed? : Total Difficulty sitting down on and standing up from a chair with arms (e.g., wheelchair, bedside commode, etc,.)?: Total Help needed moving to and from a bed to chair (including a wheelchair)?: Total Help needed walking in hospital room?: Total Help needed climbing 3-5 steps with a railing? : Total 6 Click Score: 6    End of Session   Activity Tolerance: Patient limited by fatigue;Patient limited by lethargy;Patient limited by pain Patient left: in bed;with call  bell/phone within reach;with bed alarm set;with nursing/sitter in room Nurse Communication: Mobility status PT Visit Diagnosis: Difficulty in walking, not elsewhere classified (R26.2);Pain Pain - part of body: Leg     Time: 9211-9417 PT Time Calculation (min) (ACUTE ONLY): 29 min  Charges:  $Therapeutic Activity: 23-37 mins                    G CodesTresa [name] PT [phone]    Lori [name] 06/07/2017, 1:12 PM

## 2017-06-07 NOTE — Progress Notes (Signed)
Assumed care of pt at 1545.  I agree with previous RN assessment.  Will continue with plan of care.

## 2017-06-07 NOTE — Evaluation (Addendum)
Occupational Therapy Evaluation Patient Details Name: Lori [name] MRN: [id number] DOB: [birth year] Today's Date: 06/07/2017    History of Present Illness Lori [name] is a 66 y.o. female with medical history significant of cervical cancer and endometrial carcinoma , admitted 05/29/17 for   [place] OF INTRA ABDOMINAL ABSCESS OF SMALL BOWEL PERFORATION WITH ENTEROCOLOSTOMY and  S/p LAPAROSCOPY DIAGNOSTIC WITH PERITONEAL BIOPSY on 05/29/17.  PMH:  Cerebral aneurysm, seizures, DM   Clinical Impression   Pt was admitted for the above. At time of evaluation, she was lethargic, but she did re-arouse and participate. Pt is profoundly weak, and she will benefit from continued OT.  Unsure of PLOF.  Pt needs mostly total A x 1-2 persons at this time, at bed level. Goals are for mod to max A for UB adls and rolling/sitting unsupported as precursors for toilet transfers and seated adls    Follow Up Recommendations  SNF    Equipment Recommendations  None recommended by OT    Recommendations for Other Services       Precautions / Restrictions Precautions Precautions: Fall Precaution Comments: wound vac Restrictions Weight Bearing Restrictions: No      Mobility Bed Mobility Overal bed mobility: Needs Assistance Bed Mobility: Rolling;Sidelying to Sit;Sit to Sidelying Rolling: Total assist Sidelying to sit: Total assist;+2 for safety/equipment;+2 for physical assistance;HOB elevated     Sit to sidelying: Total assist;+2 for physical assistance;+2 for safety/equipment General bed mobility comments: used chux pad.  Pt initiated bending RLE and reaching with RUE, Pt = 5%  Transfers                 General transfer comment: unable, does not hold balance in sitting.    Balance Overall balance assessment: Needs assistance Sitting-balance support: Bilateral upper extremity supported;Feet supported Sitting balance-Leahy Scale: Poor Sitting balance  - Comments: patient did sit for a few seconds without external assistance.                                   ADL either performed or assessed with clinical judgement   ADL Overall ADL's : Needs assistance/impaired   Eating/Feeding Details (indicate cue type and reason): too lethargic at this time Grooming: Wash/dry face;Minimal assistance;Bed level Grooming Details (indicate cue type and reason): proximal support for R elbow Upper Body Bathing: Total assistance;Bed level   Lower Body Bathing: Total assistance;+2 for physical assistance;Bed level   Upper Body Dressing : Total assistance;Bed level   Lower Body Dressing: Total assistance;+2 for physical assistance;Sit to/from Health and safety inspector Details (indicate cue type and reason): unable Toileting- Clothing Manipulation and Hygiene: Total assistance;+2 for physical assistance;Bed level         General ADL Comments: pt washed face with min A, RUE proximal support given. Lethargic and very weak at this time     Vision         Perception     Praxis      Pertinent Vitals/Pain Pain Assessment: Faces Faces Pain Scale: Hurts whole lot Pain Location: abdomen with log rolling Pain Descriptors / Indicators: Aching Pain Intervention(s): Limited activity within patient's tolerance;Monitored during session;Premedicated before session;Repositioned     Hand Dominance     Extremity/Trunk Assessment Upper Extremity Assessment Upper Extremity Assessment: Generalized weakness;RUE deficits/detail RUE Deficits / Details: able to lift arm to 90 with biceps recruited. Fatiques quickly and proximal support given LUE Deficits /  Details: pt unable to lift shoulder; able to bend elbow. AAROM performed to approximately 4           Communication  Pt difficult to understand   Cognition Arousal/Alertness: Lethargic Behavior During Therapy: Flat affect Overall Cognitive Status: Impaired/Different from baseline Area  of Impairment: Following commands                       Following Commands: Follows one step commands with increased time       General Comments: pt is lethargic; dozed off but able to re-arouse.  RN reports pt had ativan last night   General Comments       Exercises General Exercises - Upper Extremity Shoulder Flexion: AAROM;5 reps;Right;Left   Shoulder Instructions      Home Living Family/patient expects to be discharged to:: Unsure Living Arrangements: Children                                      Prior Functioning/Environment                   OT Problem List: Decreased strength;Decreased range of motion;Decreased activity tolerance;Decreased cognition;Decreased knowledge of use of DME or AE;Pain;Impaired UE functional use;Impaired balance (sitting and/or standing) (balance based on PT note)      OT Treatment/Interventions: Self-care/ADL training;DME and/or AE instruction;Therapeutic activities;Cognitive remediation/compensation;Patient/family education;Balance training    OT Goals(Current goals can be found in the care plan section) Acute Rehab OT Goals Patient Stated Goal: agreeable to OT; none stated OT Goal Formulation: Patient unable to participate in goal setting Time For Goal Achievement: 06/21/17 Potential to Achieve Goals: Fair ADL Goals Pt Will Perform Eating: with max assist;bed level;sitting (with assist to scoop and proximal support) Pt Will Perform Grooming: with min assist;sitting;bed level (2 tasks) Pt Will Perform Upper Body Bathing: with mod assist;sitting;bed level Additional ADL Goal #1: pt will roll to L in preparation for transfers with max A Additional ADL Goal #2: pt will sit unsupported eob x 3 minutes with mod A in preparation for toilet transfers  OT Frequency: Min 2X/week   Barriers to D/C:            Co-evaluation              AM-PAC PT "6 Clicks" Daily Activity     Outcome Measure Help from  another person eating meals?: Total Help from another person taking care of personal grooming?: A Lot Help from another person toileting, which includes using toliet, bedpan, or urinal?: Total Help from another person bathing (including washing, rinsing, drying)?: Total Help from another person to put on and taking off regular upper body clothing?: Total Help from another person to put on and taking off regular lower body clothing?: Total 6 Click Score: 7   End of Session    Activity Tolerance: Patient limited by fatigue;Patient limited by lethargy Patient left:    OT Visit Diagnosis: Muscle weakness (generalized) (M62.81)                Time: 0973-5329 OT Time Calculation (min): 15 min Charges:  OT General Charges $OT Visit: 1 Procedure OT Evaluation $OT Eval Low Complexity: 1 Procedure G-Codes:     [place], OTR/L [phone] 06/07/2017  Marion Rosenberry 06/07/2017, 2:12 PM

## 2017-06-07 NOTE — Progress Notes (Signed)
8 Days Post-Op   Subjective/Chief Complaint: No complaints. Passing flatus   Objective: Vital signs in last 24 hours: Temp:  [99.1 F (37.3 C)-99.6 F (37.6 C)] 99.1 F (37.3 C) (07/05 0518) Pulse Rate:  [96-101] 96 (07/05 0518) Resp:  [19-20] 20 (07/05 0518) BP: (143-149)/(69-83) 143/69 (07/05 0518) SpO2:  [92 %-96 %] 92 % (07/05 0518) Last BM Date: 06/06/17 (Per pt report)  Intake/Output from previous day: 07/04 0701 - 07/05 0700 In: 2814.8 [P.O.:600; I.V.:1564.8; IV Piggyback:650] Out: 2525 [Urine:2525] Intake/Output this shift: Total I/O In: 120 [P.O.:120] Out: 500 [Urine:500]  General appearance: alert and cooperative Resp: clear to auscultation bilaterally Cardio: regular rate and rhythm GI: soft, nontender. good bs  Lab Results:  No results for input([name]): WBC, HGB, HCT, PLT in the last 72 hours. BMET  Recent Labs  06/06/17 0549 06/07/17 0344  NA 135 132*  K 3.2* 3.6  CL 106 104  CO2 22 25  GLUCOSE 179* 191*  BUN 12 11  CREATININE 0.63 0.60  CALCIUM 6.9* 7.1*   PT/INR No results for input([name]): LABPROT, INR in the last 72 hours. ABG No results for input([name]): PHART, HCO3 in the last 72 hours.  Invalid input([name]): PCO2, PO2  Studies/Results: No results found.  Anti-infectives: Anti-infectives    Start     Dose/Rate Route Frequency Ordered Stop   06/03/17 1200  vancomycin (VANCOCIN) IVPB 1000 mg/200 mL premix  Status:  Discontinued     1,000 mg 200 mL/hr over 60 Minutes Intravenous Every 12 hours 06/03/17 1141 06/04/17 1105   06/02/17 1000  fluconazole (DIFLUCAN) IVPB 400 mg     400 mg 100 mL/hr over 120 Minutes Intravenous Every 24 hours 06/01/17 1017     06/01/17 1100  fluconazole (DIFLUCAN) IVPB 800 mg     800 mg 100 mL/hr over 240 Minutes Intravenous  Once 06/01/17 1017 06/01/17 1544   06/01/17 1100  vancomycin (VANCOCIN) IVPB 750 mg/150 ml premix  Status:  Discontinued     750 mg 150 mL/hr over 60 Minutes Intravenous Every 12 hours 06/01/17  1051 06/03/17 1141   05/31/17 1145  vancomycin (VANCOCIN) IVPB 750 mg/150 ml premix     750 mg 150 mL/hr over 60 Minutes Intravenous NOW 05/31/17 1130 05/31/17 1258   05/31/17 0930  vancomycin (VANCOCIN) IVPB 750 mg/150 ml premix  Status:  Discontinued     750 mg 150 mL/hr over 60 Minutes Intravenous Every 12 hours 05/31/17 0911 05/31/17 1134   05/30/17 1800  piperacillin-tazobactam (ZOSYN) IVPB 3.375 g  Status:  Discontinued     3.375 g 12.5 mL/hr over 240 Minutes Intravenous Every 8 hours 05/30/17 1734 06/06/17 0801   05/30/17 1159  ceFAZolin (ANCEF) 2-4 GM/100ML-% IVPB    Comments:  Lori [name]   : cabinet override      05/30/17 1159 05/30/17 1259   05/30/17 0600  ceFAZolin (ANCEF) IVPB 2g/100 mL premix     2 g 200 mL/hr over 30 Minutes Intravenous On call to O.R. 05/29/17 1430 05/30/17 1259      Assessment/Plan: [name]/p Procedure([name]): LAPAROSCOPY DIAGNOSTIC WITH PERITONEAL BIOPSY (N/A) EXPLORATORY LAPAROTOMY REPAIR WITH DRAINAGE OF INTRA ABDOMINAL ABSCESS OF SMALL BOWEL PERFORATION WITH ENTEROCOLOSTOMY Advance diet  Continue to work with PT Consider stopping tpn if intake is adequate  LOS: 9 days    Lori [name],Lori [name] 06/07/2017

## 2017-06-07 NOTE — Progress Notes (Signed)
PROGRESS NOTE    Lori [name]  CMK:[id number] DOB: 24-Apr-1951 DOA: 05/29/2017 PCP: [name], MD    Brief Narrative:  66 year old female who presented with abdominal pain. Patient is known to have cervical cancer status post chemoradiation 2017, recent hospitalization for small bowel obstruction. Patient develop recurrent abdominal pain, worsening, associated with abdominal distention and no bowel movements for 4 days, positive nausea and generalized weakness. On initial physical examination blood pressure 103/79, 76/50, heart rate 90, respiratory rate 20, oxygen saturation 95%, temperature 98.3. Moist mucous membranes, lungs clear to auscultation bilaterally, heart S1-S2 present rhythmic, abdomen distended, decreased bowel sounds, no guarding or rigidity, no lower extremity edema. Patient admitted with a working diagnosis of small bowel obstruction. She was found to have intra-abdominal abscesses, and septic shock secondary to metastatic adenocarcinoma per appendix. Underwent laparotomy and drainage of intra-abdominal abscesses, repair of small bowel perforation with enterocolostomy (06/27).    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 Septic shock due to intra-abdominal abscesses. Patient off antibiotic therapy, will hold on fluconazole, no signs of fungal infection, will continue gentle hydration with saline, will decrease rate to avoid volume overload, will follow a restrictive strategy for IV fluids.  2. Mucinous adenocarcinoma of the appendix, stage IV with recurrent peritoneal metastasis. Plan for palliative chemotherapy as outpatient.   3. Anemia of chronic disease. Hb and hct stable, 9.1 and 27.1 No need for blood transfusion.   4. Hypokalemia. Will continue  repletion of k with kcl, serum K up to 3,6 from 3,2, will follow on renal panel in am, patient on TPN (14.4 meq). Improved po intake.    5. Hypertension. Continue metoprolol 50 mg bid, for blood pressure control. Systolic blood pressure 569 to 150.   6. Seizure disorder. No signs of active seizure, will dc IV keppra, and will resume home regimen with tegretol.  7. T2dm. Continue insulin sliding scale for glucose cover and monitoring. Capillary glucose 168, 167, 177, 153, 163. Marland Kitchen   DVT prophylaxis: enoxaparin  Code Status: full  Family Communication: I spoke with patient's family at the bedside and all questions were addressed.  Disposition Plan: SNF   Consultants:   Surgery  Oncology  Critical Care   Procedures:    05/30/2017 1. 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 2. Exploratory laparotomy, drainage of abdominal abscess, repair of small bowel perforation, repair of small bowel enterotomy, enterocolostomy (mid ileum to mid transverse colon).   Antimicrobials:       Subjective: Patient feeling better, tolerating po well, no nausea or vomiting, positive abdominal pain, specially at dressing changes, no dyspnea or chest pain.   Objective: Vitals:   06/06/17 0310 06/06/17 0534 06/06/17 2053 06/07/17 0518  BP:  (!) 162/89 (!) 149/83 (!) 143/69  Pulse: 85 84 (!) 101 96  Resp: 20 20 19 20   Temp:  99.3 F (37.4 C) 99.6 F (37.6 C) 99.1 F (37.3 C)  TempSrc:  Oral Oral Oral  SpO2: 94% 96% 96% 92%  Weight:      Height:        Intake/Output Summary (Last 24 hours) at 06/07/17 1048 Last data filed at 06/07/17 1016  Gross per 24 hour  Intake  2554.83 ml  Output             3025 ml  Net          -470.17 ml   Filed Weights   05/29/17 0531 05/31/17 0840 06/01/17 0530  Weight: 94.1 kg (207 lb 7.3 oz) 100.4 kg (221 lb 5.5 oz) 101 kg (222 lb 10.6 oz)     Examination:  General exam: deconditioned, not in pain E ENT: mild pallor, oral mucosa moist, no icterus.  Respiratory system: Clear to auscultation. Respiratory effort normal. Mild decreased breath sounds at bases, no wheezing, rales or rhonchi.  Cardiovascular system: S1 & S2 heard, RRR. No JVD, murmurs, rubs, gallops or clicks. No pedal edema. Gastrointestinal system: Abdomen is distended, soft and nontender. No organomegaly or masses felt. Normal bowel sounds heard. Wound vac in place.  Central nervous system: Alert and oriented. No focal neurological deficits. Extremities: Symmetric 5 x 5 power. Skin: No rashes, lesions or ulcers     Data Reviewed: I have personally reviewed following labs and imaging studies  CBC:  Recent Labs Lab 06/01/17 0935 06/02/17 0428 06/03/17 0419 06/04/17 0314  WBC 15.7* 13.6* 12.9* 9.9  NEUTROABS  --   --   --  8.7*  HGB 11.2* 10.1* 9.1* 9.1*  HCT 34.1* 30.6* 27.9* 27.1*  MCV 89.3 88.2 86.9 87.4  PLT 334 256 237 294   Basic Metabolic Panel:  Recent Labs Lab 06/03/17 0419 06/04/17 0314 06/05/17 0544 06/06/17 0549 06/07/17 0344  NA 135 135 133* 135 132*  K 3.5 3.1* 3.0* 3.2* 3.6  CL 108 110 106 106 104  CO2 20* 20* 21* 22 25  GLUCOSE 184* 156* 237* 179* 191*  BUN 19 15 13 12 11   CREATININE 0.93 0.84 0.70 0.63 0.60  CALCIUM 7.0* 7.1* 7.1* 6.9* 7.1*  MG 1.6* 1.7 1.4* 1.3* 1.5*  PHOS 2.2* 2.5 2.7 2.3* 2.7   GFR: Estimated Creatinine Clearance: 84.5 mL/min (by C-G formula based on SCr of 0.6 mg/dL). Liver Function Tests:  Recent Labs Lab 06/04/17 0314 06/07/17 0344  AST 19 16  ALT 12* 9*  ALKPHOS 93 137*  BILITOT 0.7 0.2*  PROT 4.1* 4.5*  ALBUMIN 1.3* 1.2*   No results for input(s): LIPASE, AMYLASE in the last 168 hours. No results for input(s): AMMONIA in the last 168 hours. Coagulation Profile: No results for input(s): INR, PROTIME in the last 168 hours. Cardiac Enzymes:  Recent Labs Lab 06/01/17 0935  06/02/17 0428  TROPONINI 0.06* 0.04*   BNP (last 3 results) No results for input(s): PROBNP in the last 8760 hours. HbA1C: No results for input(s): HGBA1C in the last 72 hours. CBG:  Recent Labs Lab 06/06/17 1626 06/06/17 2009 06/07/17 0017 06/07/17 0413 06/07/17 0756  GLUCAP 163* 168* 167* 177* 153*   Lipid Profile: No results for input(s): CHOL, HDL, LDLCALC, TRIG, CHOLHDL, LDLDIRECT in the last 72 hours. Thyroid Function Tests: No results for input(s): TSH, T4TOTAL, FREET4, T3FREE, THYROIDAB in the last 72 hours. Anemia Panel: No results for input(s): VITAMINB12, FOLATE, FERRITIN, TIBC, IRON, RETICCTPCT in the last 72 hours. Sepsis Labs:  Recent Labs Lab 05/31/17 1620 06/01/17 0935 06/01/17 1356  LATICACIDVEN 3.4* 2.6* 1.9    Recent Results (from the past 240 hour(s))  Urine Culture     Status: None   Collection Time: 05/31/17 10:50 AM  Result Value Ref Range Status   Specimen Description URINE, CATHETERIZED  Final   Special Requests NONE  Final   Culture  Final    NO GROWTH Performed at [facility], [place] [address], [place], [place] [address]    Report Status 06/01/2017 FINAL  Final  Culture, blood (Routine X 2) w Reflex to ID Panel     Status: None   Collection Time: 05/31/17 12:10 PM  Result Value Ref Range Status   Specimen Description BLOOD RIGHT HAND  Final   Special Requests IN PEDIATRIC BOTTLE Blood Culture adequate volume  Final   Culture   Final    NO GROWTH 5 DAYS Performed at [facility], [place] [address], [place], [place] [address]    Report Status 06/05/2017 FINAL  Final  Culture, blood (Routine X 2) w Reflex to ID Panel     Status: None   Collection Time: 05/31/17  2:34 PM  Result Value Ref Range Status   Specimen Description BLOOD RIGHT HAND  Final   Special Requests IN PEDIATRIC BOTTLE Blood Culture adequate volume  Final   Culture   Final    NO GROWTH 5 DAYS Performed at [facility], [place] [address],  [place], [place] [address]    Report Status 06/05/2017 FINAL  Final         Radiology Studies: No results found.      Scheduled Meds: . aspirin  81 mg Oral Daily  . chlorhexidine  15 mL Mouth Rinse BID  . Chlorhexidine Gluconate Cloth  6 each Topical Q0600  . feeding supplement (ENSURE ENLIVE)  237 mL Oral BID BM  . heparin subcutaneous  5,000 Units Subcutaneous Q8H  . insulin aspart  0-20 Units Subcutaneous Q4H  . mouth rinse  15 mL Mouth Rinse q12n4p  . metoprolol tartrate  50 mg Oral BID  . pantoprazole (PROTONIX) IV  40 mg Intravenous Q24H   Continuous Infusions: . Marland KitchenTPN (CLINIMIX-E) Adult 40 mL/hr at 06/06/17 1829  . fluconazole (DIFLUCAN) IV Stopped (06/06/17 1425)  . levETIRAcetam Stopped (06/06/17 2156)  . sodium chloride       LOS: 9 days       Tawni Millers, MD [facility] Pager [phone]  If 7PM-7AM, please contact night-coverage [email or web address] Password [email or web address] 06/07/2017, 10:48 AM

## 2017-06-08 LAB — CBC WITH DIFFERENTIAL/PLATELET
BASOS ABS: 0 10*3/uL (ref 0.0–0.1)
BASOS PCT: 0 %
EOS ABS: 0 10*3/uL (ref 0.0–0.7)
Eosinophils Relative: 0 %
HCT: 29.6 % — ABNORMAL LOW (ref 36.0–46.0)
HEMOGLOBIN: 9.6 g/dL — AB (ref 12.0–15.0)
Lymphocytes Relative: 7 %
Lymphs Abs: 0.6 10*3/uL — ABNORMAL LOW (ref 0.7–4.0)
MCH: 28.2 pg (ref 26.0–34.0)
MCHC: 32.4 g/dL (ref 30.0–36.0)
MCV: 87.1 fL (ref 78.0–100.0)
MONOS PCT: 7 %
Monocytes Absolute: 0.7 10*3/uL (ref 0.1–1.0)
NEUTROS PCT: 86 %
Neutro Abs: 7.8 10*3/uL — ABNORMAL HIGH (ref 1.7–7.7)
Platelets: 206 10*3/uL (ref 150–400)
RBC: 3.4 MIL/uL — ABNORMAL LOW (ref 3.87–5.11)
RDW: 15.3 % (ref 11.5–15.5)
WBC: 9.1 10*3/uL (ref 4.0–10.5)

## 2017-06-08 LAB — BASIC METABOLIC PANEL
Anion gap: 5 (ref 5–15)
BUN: 10 mg/dL (ref 6–20)
CALCIUM: 7.1 mg/dL — AB (ref 8.9–10.3)
CHLORIDE: 99 mmol/L — AB (ref 101–111)
CO2: 28 mmol/L (ref 22–32)
CREATININE: 0.49 mg/dL (ref 0.44–1.00)
GFR calc non Af Amer: >60 mL/min (ref >60)
Glucose, Bld: 133 mg/dL — ABNORMAL HIGH (ref 65–99)
Potassium: 3.8 mmol/L (ref 3.5–5.1)
SODIUM: 132 mmol/L — AB (ref 135–145)

## 2017-06-08 LAB — GLUCOSE, CAPILLARY
GLUCOSE-CAPILLARY: 126 mg/dL — AB (ref 65–99)
GLUCOSE-CAPILLARY: 178 mg/dL — AB (ref 65–99)
Glucose-Capillary: 118 mg/dL — ABNORMAL HIGH (ref 65–99)
Glucose-Capillary: 145 mg/dL — ABNORMAL HIGH (ref 65–99)

## 2017-06-08 LAB — MAGNESIUM: Magnesium: 1.4 mg/dL — ABNORMAL LOW (ref 1.7–2.4)

## 2017-06-08 LAB — PREPARE RBC (CROSSMATCH)

## 2017-06-08 MED ORDER — BOOST / RESOURCE BREEZE PO LIQD: 1.0000 | Freq: Two times a day (BID) | ORAL | 0 refills | Status: DC

## 2017-06-08 MED ORDER — HEPARIN SOD (PORK) LOCK FLUSH 100 UNIT/ML IV SOLN
500.0000 [IU] | INTRAVENOUS | Status: AC | PRN
  Administered 2017-06-08: 500 [IU]

## 2017-06-08 MED ORDER — FAMOTIDINE 20 MG PO TABS: 20.0000 mg | ORAL_TABLET | Freq: Every day | ORAL | 0 refills | Status: AC

## 2017-06-08 MED ORDER — ENSURE ENLIVE PO LIQD: 237.0000 mL | Freq: Two times a day (BID) | ORAL | 12 refills | Status: DC

## 2017-06-08 MED ORDER — ALUM & MAG HYDROXIDE-SIMETH 200-200-20 MG/5ML PO SUSP: 30.0000 mL | Freq: Four times a day (QID) | ORAL | 0 refills | Status: DC | PRN

## 2017-06-08 MED ORDER — SODIUM CHLORIDE 0.9 % IV SOLN
Freq: Once | INTRAVENOUS | Status: AC
  Administered 2017-06-08: 06:00:00 via INTRAVENOUS

## 2017-06-08 MED ORDER — INSULIN ASPART 100 UNIT/ML ~~LOC~~ SOLN: 0.0000 [IU] | SUBCUTANEOUS | 11 refills | Status: DC

## 2017-06-08 MED ORDER — ACETAMINOPHEN 500 MG PO TABS: 500.0000 mg | ORAL_TABLET | Freq: Four times a day (QID) | ORAL | 0 refills | Status: DC | PRN

## 2017-06-08 NOTE — Discharge Summary (Addendum)
Physician Discharge Summary  Lori [name] UUV:[id number] DOB: 1951/05/18 DOA: 05/29/2017  PCP: [name], MD  Admit date: 05/29/2017 Discharge date: 06/08/2017  Admitted From: Home  Disposition:  SNF  Recommendations for Outpatient Follow-up:  1. Follow up with PCP in 1-weeks  2. Abdominal wound vac as per protocol:             Negative pressure wound therapy: Routine every Tuesday Thursday Saturday             Bedside RN to perform (started on Thursday 06/07/17)             *use 1/2 ring at most distal end of wound              Amount suction: 125 mmHg              Suction type: Continues  3.          Patient discharged with Foley catheter, please remove foley and do a voiding trial in 24 hours, please monitor                 for urinary retention.    Home Health: No  Equipment/Devices: No   Discharge Condition: Stable  CODE STATUS: Full  Diet recommendation: Heart Healthy / Carb Modified   Brief/Interim Summary: 66 year old female who presented with abdominal pain. Patient is known to have cervical cancer status post chemoradiation 2017, recent hospitalization for small bowel obstruction. Patient develop recurrent abdominal pain, worsening, associated with abdominal distention and no bowel movements for 4 days, positive nausea and generalized weakness. On initial physical examination blood pressure 103/79, 76/50, heart rate 90, respiratory rate 20, oxygen saturation 95%, temperature 98.3. Moist mucous membranes, lungs clear to auscultation bilaterally, heart S1-S2 present rhythmic, abdomen distended, decreased bowel sounds, no guarding or rigidity, no lower extremity edema. Sodium 129, potassium 3.6, chloride 94, bicarbonate 25, glucose 182, BUN 26, creatinine 0.86, white count 8.8, hemoglobin 0.3, hematocrit 33.7, platelets 385, abdominal films consistent with small bowel obstruction, chest x-ray clear for infiltrates, EKG sinus rhythm, first-degree AV block, lateral T-wave  inversions, left axis deviation.   Patient admitted with a working diagnosis of small bowel obstruction, complicated by sepsis..  1. Septic shock due to intra-abdominal abscesses, present on admission.Patient was admitted to the hospital, she was placed on IV fluids, IV antiemetics and IV analgesics. Nothing by mouth. Surgery was consulted, patient was taken to the operating room on June 27, she underwent diagnostic laparoscopy with peritoneal nodule biopsy, drainage of abscesses, repair of small bowel perforation, repair of small bowel enterotomy, enterocolostomy (mid ileum to mid transverse colon). She was placed on broad-spectrum on antibiotic therapy. Postoperatively patient develop hypotension and hypothermia. Patient received IV fluids, vasopressors. She responded well to medical care, she was successfully transferred to the medical ward. She completed antibiotics in the hospital. Diet was advanced progressively. She did require TPN while in hospital. Currently patient has a wound VAC placed at her surgical wound.  2. Mucinous adenocarcinoma of the appendix, stage IV with recurrent peritoneal metastasis. Oncology was consulted, patient has opted to proceed with therapy. Plan to follow-up with Dr.Zhou as outpatient, once patient has recovered her functional status.  3. Anemia chronic disease. Her hemoglobin and hematocrit remained stable.  4. Hypokalemia. She had aggressive potassium repletion potassium chloride.  5. Hypertension. Systolic blood pressure has ranged between 140 and 150 over last 72 hours. All antihypertensive agents were held. Note that patient was on clonidine at home. For now we'll  continue metoprolol 50 g twice daily. Depending on her blood pressure further antihypertensive agents can be added to her regimen.  6. Seizure disorder. Patient was placed on IV Keppra while nothing by mouth, patient has been changed back to Tegretol now but she can tolerate by mouth intake. No signs  of clinical seizures.  7. Type 2 diabetes mellitus. Patient was placed on insulin signed scale for glucose coverage and monitoring. Her capillary glucose, 118, 145, 126, 178. Recommend to continue insulin therapy for now.    Discharge Diagnoses:  Principal Problem:   SBO (small bowel obstruction) (HCC) Active Problems:   Cervical cancer, FIGO stage IIB (HCC)   HTN (hypertension)   Seizure (HCC)   Severe sepsis with septic shock (HCC)   Peritoneal carcinomatosis ([place])    Discharge Instructions   Allergies as of 06/08/2017      Reactions   Contrast Media [iodinated Diagnostic Agents] Itching, Swelling   Dilantin [phenytoin Sodium Extended] Itching, Swelling, Other (See Comments)   Whole body   Latex Hives, Itching   Adhesive [tape] Rash      Medication List    STOP taking these medications   amLODipine 10 MG tablet Commonly known as:  NORVASC   benazepril 40 MG tablet Commonly known as:  LOTENSIN   cholecalciferol 1000 units tablet Commonly known as:  VITAMIN D   cloNIDine 0.2 MG tablet Commonly known as:  CATAPRES   hydrochlorothiazide 25 MG tablet Commonly known as:  HYDRODIURIL   loperamide 2 MG tablet Commonly known as:  IMODIUM A-D   pantoprazole 40 MG tablet Commonly known as:  PROTONIX     TAKE these medications   acetaminophen 500 MG tablet Commonly known as:  TYLENOL Take 1 tablet (500 mg total) by mouth every 6 (six) hours as needed for moderate pain.   alum & mag hydroxide-simeth 200-200-20 MG/5ML suspension Commonly known as:  MAALOX/MYLANTA Take 30 mLs by mouth every 6 (six) hours as needed for indigestion or heartburn.   aspirin EC 81 MG tablet Take 81 mg by mouth daily.   carbamazepine 100 MG chewable tablet Commonly known as:  TEGRETOL Chew 100 mg by mouth 3 (three) times daily.   famotidine 20 MG tablet Commonly known as:  PEPCID Take 1 tablet (20 mg total) by mouth daily. Start taking on:  06/09/2017   feeding supplement  Liqd Take 1 Container by mouth 2 (two) times daily between meals.   feeding supplement (ENSURE ENLIVE) Liqd Take 237 mLs by mouth 2 (two) times daily between meals.   gabapentin 300 MG capsule Commonly known as:  NEURONTIN Take 300 mg by mouth 2 (two) times daily.   insulin aspart 100 UNIT/ML injection Commonly known as:  novoLOG Inject 0-20 Units into the skin every 4 (four) hours. Glucose 150-200 give 2 units, 201-250 give 4 units, 251-300 give 6 units, 301-350 give 8 units, 351 or greater give 10 units.   magnesium oxide 400 (241.3 Mg) MG tablet Commonly known as:  MAG-OX Take 400 mg by mouth daily.   metoprolol tartrate 50 MG tablet Commonly known as:  LOPRESSOR Take 25 mg by mouth 2 (two) times daily.   potassium chloride SA 20 MEQ tablet Commonly known as:  K-DUR,KLOR-CON Take 20 mEq by mouth 2 (two) times daily.   pravastatin 20 MG tablet Commonly known as:  PRAVACHOL Take 20 mg by mouth every evening.       Contact information for follow-up providers    Twana First, MD Follow up  in 1 week(s).   Specialty:  Oncology Contact information: [place] [id number] [phone]            Contact information for after-discharge care    [facility] Follow up.   Specialty:  [place] information: [address] [address] [phone]                 Allergies  Allergen Reactions  . Contrast Media [Iodinated Diagnostic Agents] Itching and Swelling  . Dilantin [Phenytoin Sodium Extended] Itching, Swelling and Other (See Comments)    Whole body  . Latex Hives and Itching  . Adhesive [Tape] Rash    Consultations:  Surgery  Critical Care  Oncology    Procedures/Studies: Ct Abdomen Pelvis Wo Contrast  Result Date: 05/15/2017 CLINICAL DATA:  Abdominal pain and constipation EXAM: CT ABDOMEN AND PELVIS WITHOUT CONTRAST TECHNIQUE: Multidetector CT imaging of  the abdomen and pelvis was performed following the standard protocol without IV contrast. COMPARISON:  05/05/2017, 05/03/2017 FINDINGS: Lower chest: Lung bases demonstrate no acute consolidation or effusion. There is cardiomegaly. Small pericardial effusion. Coronary artery calcification. Hiatal hernia with distal esophageal thickening. Hepatobiliary: No calcified gallstones. No biliary dilatation or focal hepatic abnormality. Pancreas: Unremarkable. No pancreatic ductal dilatation or surrounding inflammatory changes. Spleen: Normal in size without focal abnormality. Adrenals/Urinary Tract: Adrenal glands are within normal limits. Atrophic and scarred left kidney. No hydronephrosis. The bladder is unremarkable. Stomach/Bowel: Marked enlargement of the stomach. Multiple loops of dilated, fluid-filled small bowel with slight increased distension since the previous examination. Findings consistent with mechanical bowel obstruction. No colon wall thickening. Prior appendectomy. Vascular/Lymphatic: Aortic atherosclerosis. No enlarged abdominal or pelvic lymph nodes. Reproductive: Suspect that there is thickening of the endometrial stripe, measuring 19 mm on sagittal views. No adnexal masses. Other: New or increased free fluid in the pelvis and around the liver. No free air. Small pockets of soft tissue gas in the left gluteal region. Musculoskeletal: No acute or suspicious bone lesion. IMPRESSION: 1. Findings again consistent with mechanical small bowel obstruction, the stomach is markedly enlarged. Overall increased dilatation of the small bowel loops since the previous study, consistent with worsening obstruction. No free air, however increasing free fluid in the abdomen and pelvis. 2. Cardiomegaly with small pericardial effusion. 3. Suspect thickening of the endometrial stripe, this may be correlated with pelvic ultrasound 4. Electronically Signed   By: Donavan Foil M.D.   On: 05/15/2017 02:44   Dg Chest 1  View  Result Date: 05/15/2017 CLINICAL DATA:  Assess position of right-sided chest port. Initial encounter. EXAM: CHEST 1 VIEW COMPARISON:  PET/CT performed 02/02/2017 FINDINGS: The patient's right-sided chest port is noted ending about the distal SVC. An enteric tube is noted extending below the diaphragm. Mild peribronchial thickening is noted. No pleural effusion or pneumothorax is seen. The cardiomediastinal silhouette is borderline normal in size. No acute osseous abnormalities are identified. IMPRESSION: 1. Right-sided chest port noted ending about the distal SVC. 2. Mild peribronchial thickening noted.  Lungs otherwise clear. Electronically Signed   By: Garald Balding M.D.   On: 05/15/2017 03:41   Dg Abd 1 View  Result Date: 05/29/2017 CLINICAL DATA:  66 year old female with nasogastric tube placement. Subsequent encounter. EXAM: ABDOMEN - 1 VIEW COMPARISON:  05/29/2017. FINDINGS: Nasogastric tube tip and side hole gastric body level. Gas distended small bowel loops with thickened folds consistent with small bowel obstruction once again noted. The  possibility of free intraperitoneal air cannot be assessed on a supine view. IMPRESSION: Nasogastric tube tip and side hole gastric body level. Gas distended small bowel loops with thickened folds consistent with small bowel obstruction once again noted. Electronically Signed   By: Genia Del M.D.   On: 05/29/2017 07:06   Dg Abd 1 View  Result Date: 05/17/2017 CLINICAL DATA:  Follow-up of small bowel obstruction EXAM: ABDOMEN - 1 VIEW COMPARISON:  Abdominal radiograph of May 16, 2017 FINDINGS: There remain loops of moderately distended gas-filled small bowel in the mid abdomen. There is some gas within the ascending and transverse colon. No free extraluminal gas collections are observed. There is a tiny amount of gas in the rectum. The esophagogastric tube has been removed. IMPRESSION: Findings compatible with a fairly high-grade mid to distal small  bowel obstruction. No evidence of perforation. Electronically Signed   By: David  Martinique M.D.   On: 05/17/2017 08:38   Dg Abd 1 View  Result Date: 05/16/2017 CLINICAL DATA:  NG tube placement. EXAM: ABDOMEN - 1 VIEW COMPARISON:  05/15/2017 FINDINGS: An NG tube is identified with tip overlying the proximal stomach. Dilated small bowel loops are again noted. IMPRESSION: NG tube with tip overlying the proximal stomach. Unchanged dilated small bowel loops. Electronically Signed   By: Margarette Canada M.D.   On: 05/16/2017 19:03   Dg Abdomen 1 View  Result Date: 05/15/2017 CLINICAL DATA:  NG tube placement EXAM: ABDOMEN - 1 VIEW COMPARISON:  05/15/2017 FINDINGS: Esophageal tube tip overlies the mid stomach. Multiple loops of dilated small bowel consistent with a bowel obstruction. IMPRESSION: Esophageal tube tip overlies the mid stomach. Electronically Signed   By: Donavan Foil M.D.   On: 05/15/2017 03:36   Dg Chest Port 1 View  Result Date: 06/04/2017 CLINICAL DATA:  66 year old female with respiratory failure. Subsequent encounter. EXAM: PORTABLE CHEST 1 VIEW COMPARISON:  05/31/2017. FINDINGS: Right central line tip proximal right atrium level. Nasogastric tube courses below the diaphragm. Tip is not included on the present exam. Cardiomegaly. Pulmonary vascular congestion/mild pulmonary edema. Basilar atelectasis suspected. Infiltrate felt to be less likely consideration. Small pleural effusions suspected. Calcified aorta. No pneumothorax. IMPRESSION: Pulmonary vascular congestion/ mild pulmonary edema. Small pleural effusions suspected. Basilar subsegmental atelectasis. Cardiomegaly. Electronically Signed   By: Genia Del M.D.   On: 06/04/2017 07:02   Dg Chest Port 1 View  Result Date: 05/31/2017 CLINICAL DATA:  Persistent hypotension EXAM: PORTABLE CHEST 1 VIEW COMPARISON:  05/29/2017 FINDINGS: NG tube tip is in the stomach. There are low lung volumes with bibasilar atelectasis. Mild cardiomegaly. No  effusions or acute bony abnormality. IMPRESSION: Low lung volumes, bibasilar atelectasis. Electronically Signed   By: Rolm Baptise M.D.   On: 05/31/2017 09:40   Dg Abd Acute W/chest  Result Date: 05/29/2017 CLINICAL DATA:  History of cervical cancer, abdominal distention EXAM: DG ABDOMEN ACUTE W/ 1V CHEST COMPARISON:  05/17/2017, 05/15/2017 FINDINGS: Right-sided central venous port tip overlies the distal SVC. Small focus of atelectasis at the left base. No pleural effusion. Stable enlarged cardiomediastinal silhouette. Supine and upright views of the abdomen demonstrate multiple dilated loops of small bowel measuring up to 5.2 cm consistent with mechanical small bowel obstruction. No obvious free air on decubitus view. IMPRESSION: 1. No radiographic evidence for acute cardiopulmonary abnormality 2. Multiple loops of dilated small bowel, consistent with small bowel obstruction, this appears worsened radiographically compared with 05/17/2017. Electronically Signed   By: Donavan Foil M.D.   On: 05/29/2017  03:31   Dg Abd Portable 1v  Result Date: 06/03/2017 CLINICAL DATA:  Nasogastric tube placement EXAM: PORTABLE ABDOMEN - 1 VIEW COMPARISON:  Portable exam 0943 hours compared to 05/29/2017 FINDINGS: Tip of nasogastric tube projects over proximal stomach. Gas and stool in transverse colon. Nonobstructive bowel gas pattern. No acute osseous findings. IMPRESSION: Tip of nasogastric tube projects over proximal stomach. Electronically Signed   By: Lavonia Dana M.D.   On: 06/03/2017 10:19    Subjective: Patient is feeling better, no nausea or vomiting, tolerating po well, no dyspnea or chest pain.   Discharge Exam: Vitals:   06/08/17 0456 06/08/17 0927  BP: (!) 146/83 (!) 151/92  Pulse: 78 88  Resp: 20   Temp: 99.4 F (37.4 C)    Vitals:   06/07/17 1421 06/07/17 2121 06/08/17 0456 06/08/17 0927  BP: (!) 154/78 (!) 156/85 (!) 146/83 (!) 151/92  Pulse: 80 87 78 88  Resp: 18 18 20    Temp: 98.4 F  (36.9 C) 99.7 F (37.6 C) 99.4 F (37.4 C)   TempSrc: Oral Oral Oral   SpO2: 96% 94% 94%   Weight:      Height:        General: Pt is alert, awake, not in acute distress E ENT: mild pallor, no icterus, oral mucosa moist.  Cardiovascular: RRR, S1/S2 +, no rubs, no gallops Respiratory: CTA bilaterally, no wheezing, no rhonchi Abdominal: Soft, NT, ND, bowel sounds +, distended, wound vac in place, nonteder.  Extremities: trace non pitting edema, no cyanosis    The results of significant diagnostics from this hospitalization (including imaging, microbiology, ancillary and laboratory) are listed below for reference.     Microbiology: Recent Results (from the past 240 hour(s))  Urine Culture     Status: None   Collection Time: 05/31/17 10:50 AM  Result Value Ref Range Status   Specimen Description URINE, CATHETERIZED  Final   Special Requests NONE  Final   Culture   Final    NO GROWTH Performed at [facility], [address], [place], [place] [address]    Report Status 06/01/2017 FINAL  Final  Culture, blood (Routine X 2) w Reflex to ID Panel     Status: None   Collection Time: 05/31/17 12:10 PM  Result Value Ref Range Status   Specimen Description BLOOD RIGHT HAND  Final   Special Requests IN PEDIATRIC BOTTLE Blood Culture adequate volume  Final   Culture   Final    NO GROWTH 5 DAYS Performed at [facility], [place] [address], [place], [place] [address]    Report Status 06/05/2017 FINAL  Final  Culture, blood (Routine X 2) w Reflex to ID Panel     Status: None   Collection Time: 05/31/17  2:34 PM  Result Value Ref Range Status   Specimen Description BLOOD RIGHT HAND  Final   Special Requests IN PEDIATRIC BOTTLE Blood Culture adequate volume  Final   Culture   Final    NO GROWTH 5 DAYS Performed at [facility], [place] [address] [place], [place] [address]    Report Status 06/05/2017 FINAL  Final     Labs: BNP (last 3 results) No results for  input(s): BNP in the last 8760 hours. Basic Metabolic Panel:  Recent Labs Lab 06/03/17 0419 06/04/17 0314 06/05/17 0544 06/06/17 0549 06/07/17 0344 06/08/17 0512  NA 135 135 133* 135 132* 132*  K 3.5 3.1* 3.0* 3.2* 3.6 3.8  CL 108  110 106 106 104 99*  CO2 20* 20* 21* 22 25 28   GLUCOSE 184* 156* 237* 179* 191* 133*  BUN 19 15 13 12 11 10   CREATININE 0.93 0.84 0.70 0.63 0.60 0.49  CALCIUM 7.0* 7.1* 7.1* 6.9* 7.1* 7.1*  MG 1.6* 1.7 1.4* 1.3* 1.5* 1.4*  PHOS 2.2* 2.5 2.7 2.3* 2.7  --    Liver Function Tests:  Recent Labs Lab 06/04/17 0314 06/07/17 0344  AST 19 16  ALT 12* 9*  ALKPHOS 93 137*  BILITOT 0.7 0.2*  PROT 4.1* 4.5*  ALBUMIN 1.3* 1.2*   No results for input(s): LIPASE, AMYLASE in the last 168 hours. No results for input(s): AMMONIA in the last 168 hours. CBC:  Recent Labs Lab 06/02/17 0428 06/03/17 0419 06/04/17 0314 06/08/17 0512 06/08/17 0645  WBC 13.6* 12.9* 9.9 QUESTIONABLE RESULTS, RECOMMEND RECOLLECT TO VERIFY 9.1  NEUTROABS  --   --  8.7* QUESTIONABLE RESULTS, RECOMMEND RECOLLECT TO VERIFY 7.8*  HGB 10.1* 9.1* 9.1* QUESTIONABLE RESULTS, RECOMMEND RECOLLECT TO VERIFY 9.6*  HCT 30.6* 27.9* 27.1* QUESTIONABLE RESULTS, RECOMMEND RECOLLECT TO VERIFY 29.6*  MCV 88.2 86.9 87.4 QUESTIONABLE RESULTS, RECOMMEND RECOLLECT TO VERIFY 87.1  PLT 256 237 231 QUESTIONABLE RESULTS, RECOMMEND RECOLLECT TO VERIFY 206   Cardiac Enzymes:  Recent Labs Lab 06/02/17 0428  TROPONINI 0.04*   BNP: Invalid input(s): POCBNP CBG:  Recent Labs Lab 06/07/17 2009 06/08/17 0007 06/08/17 0450 06/08/17 0723 06/08/17 1137  GLUCAP 148* 145* 118* 126* 178*   D-Dimer No results for input(s): DDIMER in the last 72 hours. Hgb A1c No results for input(s): HGBA1C in the last 72 hours. Lipid Profile No results for input(s): CHOL, HDL, LDLCALC, TRIG, CHOLHDL, LDLDIRECT in the last 72 hours. Thyroid function studies No results for input(s): TSH, T4TOTAL, T3FREE, THYROIDAB  in the last 72 hours.  Invalid input(s): FREET3 Anemia work up No results for input(s): VITAMINB12, FOLATE, FERRITIN, TIBC, IRON, RETICCTPCT in the last 72 hours. Urinalysis    Component Value Date/Time   COLORURINE AMBER (A) 05/15/2017 0230   APPEARANCEUR CLEAR 05/15/2017 0230   LABSPEC 1.028 05/15/2017 0230   LABSPEC 1.010 08/17/2015 1542   PHURINE 5.0 05/15/2017 0230   GLUCOSEU NEGATIVE 05/15/2017 0230   GLUCOSEU 100 08/17/2015 1542   HGBUR NEGATIVE 05/15/2017 0230   BILIRUBINUR NEGATIVE 05/15/2017 0230   BILIRUBINUR Negative 08/17/2015 1542   KETONESUR NEGATIVE 05/15/2017 0230   PROTEINUR 30 (A) 05/15/2017 0230   UROBILINOGEN 1.0 08/26/2015 0725   UROBILINOGEN 0.2 08/17/2015 1542   NITRITE NEGATIVE 05/15/2017 0230   LEUKOCYTESUR NEGATIVE 05/15/2017 0230   LEUKOCYTESUR Trace 08/17/2015 1542   Sepsis Labs Invalid input(s): PROCALCITONIN,  WBC,  LACTICIDVEN Microbiology Recent Results (from the past 240 hour(s))  Urine Culture     Status: None   Collection Time: 05/31/17 10:50 AM  Result Value Ref Range Status   Specimen Description URINE, CATHETERIZED  Final   Special Requests NONE  Final   Culture   Final    NO GROWTH Performed at [facility], [place] [address], [place], [place] [address]    Report Status 06/01/2017 FINAL  Final  Culture, blood (Routine X 2) w Reflex to ID Panel     Status: None   Collection Time: 05/31/17 12:10 PM  Result Value Ref Range Status   Specimen Description BLOOD RIGHT HAND  Final   Special Requests IN PEDIATRIC BOTTLE Blood Culture adequate volume  Final   Culture   Final    NO GROWTH 5  DAYS Performed at [facility], [place] [address], [place], [place] [address]    Report Status 06/05/2017 FINAL  Final  Culture, blood (Routine X 2) w Reflex to ID Panel     Status: None   Collection Time: 05/31/17  2:34 PM  Result Value Ref Range Status   Specimen Description BLOOD RIGHT HAND  Final   Special Requests IN PEDIATRIC BOTTLE  Blood Culture adequate volume  Final   Culture   Final    NO GROWTH 5 DAYS Performed at [facility], [place] [address], [place], [place] [address]    Report Status 06/05/2017 FINAL  Final     Time coordinating discharge: 45 minutes  SIGNED:   Tawni Millers, MD  [facility] 06/08/2017, 12:59 PM Pager   If 7PM-7AM, please contact night-coverage [email or web address] Password [email or web address]

## 2017-06-08 NOTE — Progress Notes (Signed)
CRITICAL VALUE ALERT  Critical Value:  Hgb 6.4  Date & Time Notied:  06/08/17 05:50 AM  Provider Notified: T. Opyd  Orders Received/Actions taken: D/C heparin, Type & Cross, Transfuse 2 Units RBC's

## 2017-06-08 NOTE — Clinical Social Work Placement (Signed)
   CLINICAL SOCIAL WORK PLACEMENT  NOTE  Date:  06/08/2017  Patient Details  Name: Lori [name] MRN: [id number] Date of Birth: [birth year]  Clinical Social Work is seeking post-discharge placement for this patient at the [place] level of care (*CSW will initial, date and re-position this form in  chart as items are completed):  Yes   Patient/family provided with [place] Work Department's list of facilities offering this level of care within the geographic area requested by the patient (or if unable, by the patient's family).  Yes   Patient/family informed of their freedom to choose among providers that offer the needed level of care, that participate in Medicare, Medicaid or managed care program needed by the patient, have an available bed and are willing to accept the patient.  Yes   Patient/family informed of [place]'s ownership interest in [facility] and [facility], as well as of the fact that they are under no obligation to receive care at these facilities.  PASRR submitted to EDS on 06/05/17     PASRR number received on 06/05/17     Existing PASRR number confirmed on       FL2 transmitted to all facilities in geographic area requested by pt/family on 06/05/17     FL2 transmitted to all facilities within larger geographic area on       Patient informed that his/her managed care company has contracts with or will negotiate with certain facilities, including the following:        Yes   Patient/family informed of bed offers received.  Patient chooses bed at [facility]     Physician recommends and patient chooses bed at      Patient to be transferred to [facility] on 06/08/17.  Patient to be transferred to facility by [facility]     Patient family notified on 06/08/17 of transfer.  Name of family member notified:  Son     PHYSICIAN       Additional Comment: Pt / family are in agreement with plan for dc to  [facility] today. [facility] transport is required. Medical necessity form completed. BCBS medicare has provided authorization for SNF placement. DC Summary sent to SNF for review. No scripts printed. # for report provided to nsg.   _______________________________________________ Luretha Rued, [place] 06/08/2017, 3:09 PM

## 2017-06-08 NOTE — Progress Notes (Signed)
9 Days Post-Op   Subjective/Chief Complaint: No complaints. Tolerating some diet   Objective: Vital signs in last 24 hours: Temp:  [98.4 F (36.9 C)-99.7 F (37.6 C)] 99.4 F (37.4 C) (07/06 0456) Pulse Rate:  [78-88] 88 (07/06 0927) Resp:  [18-20] 20 (07/06 0456) BP: (146-156)/(78-92) 151/92 (07/06 0927) SpO2:  [94 %-96 %] 94 % (07/06 0456) Last BM Date: 06/08/17  Intake/Output from previous day: 07/05 0701 - 07/06 0700 In: 1690.3 [P.O.:1280; I.V.:410.3] Out: 1650 [Urine:1650] Intake/Output this shift: Total I/O In: 120 [P.O.:120] Out: 300 [Urine:300]  General appearance: alert and cooperative Resp: clear to auscultation bilaterally Cardio: regular rate and rhythm GI: soft, nontender. some distension. vac in place. having bm's  Lab Results:   Recent Labs  06/08/17 0512 06/08/17 0645  WBC QUESTIONABLE RESULTS, RECOMMEND RECOLLECT TO VERIFY 9.1  HGB QUESTIONABLE RESULTS, RECOMMEND RECOLLECT TO VERIFY 9.6*  HCT QUESTIONABLE RESULTS, RECOMMEND RECOLLECT TO VERIFY 29.6*  PLT QUESTIONABLE RESULTS, RECOMMEND RECOLLECT TO VERIFY 206   BMET  Recent Labs  06/07/17 0344 06/08/17 0512  NA 132* 132*  K 3.6 3.8  CL 104 99*  CO2 25 28  GLUCOSE 191* 133*  BUN 11 10  CREATININE 0.60 0.49  CALCIUM 7.1* 7.1*   PT/INR No results for input(s): LABPROT, INR in the last 72 hours. ABG No results for input(s): PHART, HCO3 in the last 72 hours.  Invalid input(s): PCO2, PO2  Studies/Results: No results found.  Anti-infectives: Anti-infectives    Start     Dose/Rate Route Frequency Ordered Stop   06/03/17 1200  vancomycin (VANCOCIN) IVPB 1000 mg/200 mL premix  Status:  Discontinued     1,000 mg 200 mL/hr over 60 Minutes Intravenous Every 12 hours 06/03/17 1141 06/04/17 1105   06/02/17 1000  fluconazole (DIFLUCAN) IVPB 400 mg  Status:  Discontinued     400 mg 100 mL/hr over 120 Minutes Intravenous Every 24 hours 06/01/17 1017 06/07/17 1131   06/01/17 1100  fluconazole  (DIFLUCAN) IVPB 800 mg     800 mg 100 mL/hr over 240 Minutes Intravenous  Once 06/01/17 1017 06/01/17 1544   06/01/17 1100  vancomycin (VANCOCIN) IVPB 750 mg/150 ml premix  Status:  Discontinued     750 mg 150 mL/hr over 60 Minutes Intravenous Every 12 hours 06/01/17 1051 06/03/17 1141   05/31/17 1145  vancomycin (VANCOCIN) IVPB 750 mg/150 ml premix     750 mg 150 mL/hr over 60 Minutes Intravenous NOW 05/31/17 1130 05/31/17 1258   05/31/17 0930  vancomycin (VANCOCIN) IVPB 750 mg/150 ml premix  Status:  Discontinued     750 mg 150 mL/hr over 60 Minutes Intravenous Every 12 hours 05/31/17 0911 05/31/17 1134   05/30/17 1800  piperacillin-tazobactam (ZOSYN) IVPB 3.375 g  Status:  Discontinued     3.375 g 12.5 mL/hr over 240 Minutes Intravenous Every 8 hours 05/30/17 1734 06/06/17 0801   05/30/17 1159  ceFAZolin (ANCEF) 2-4 GM/100ML-% IVPB    Comments:  Bridget Hartshorn   : cabinet override      05/30/17 1159 05/30/17 1259   05/30/17 0600  ceFAZolin (ANCEF) IVPB 2g/100 mL premix     2 g 200 mL/hr over 30 Minutes Intravenous On call to O.R. 05/29/17 1430 05/30/17 1259      Assessment/Plan: s/p Procedure(s): LAPAROSCOPY DIAGNOSTIC WITH PERITONEAL BIOPSY (N/A) EXPLORATORY LAPAROTOMY REPAIR WITH DRAINAGE OF INTRA ABDOMINAL ABSCESS OF SMALL BOWEL PERFORATION WITH ENTEROCOLOSTOMY Advance diet  Continue vac Ok for discharge when medically stable  LOS: 10 days  TOTH III,Saurabh Hettich S 06/08/2017

## 2017-06-12 LAB — TYPE AND SCREEN
ABO/RH(D): O POS
Antibody Screen: NEGATIVE
UNIT DIVISION: 0
UNIT DIVISION: 0

## 2017-06-12 LAB — BPAM RBC
BLOOD PRODUCT EXPIRATION DATE: 201807282359
Blood Product Expiration Date: 201807282359
UNIT TYPE AND RH: 5100
UNIT TYPE AND RH: 5100

## 2017-06-23 ENCOUNTER — Emergency Department (HOSPITAL_COMMUNITY): Payer: Medicare Other

## 2017-06-23 ENCOUNTER — Encounter (HOSPITAL_COMMUNITY): Payer: Self-pay | Admitting: Emergency Medicine

## 2017-06-23 ENCOUNTER — Inpatient Hospital Stay (HOSPITAL_COMMUNITY)
Admission: EM | Admit: 2017-06-23 | Discharge: 2017-08-10 | DRG: 377 | Disposition: A | Discharge status: Skilled Nursing Facility | Payer: Medicare Other | Attending: Internal Medicine | Admitting: Internal Medicine

## 2017-06-23 DIAGNOSIS — C786 Secondary malignant neoplasm of retroperitoneum and peritoneum: Secondary | ICD-10-CM | POA: Diagnosis not present

## 2017-06-23 DIAGNOSIS — E232 Diabetes insipidus: Secondary | ICD-10-CM | POA: Diagnosis not present

## 2017-06-23 DIAGNOSIS — C801 Malignant (primary) neoplasm, unspecified: Secondary | ICD-10-CM | POA: Diagnosis not present

## 2017-06-23 DIAGNOSIS — C181 Malignant neoplasm of appendix: Secondary | ICD-10-CM

## 2017-06-23 DIAGNOSIS — I69354 Hemiplegia and hemiparesis following cerebral infarction affecting left non-dominant side: Secondary | ICD-10-CM

## 2017-06-23 DIAGNOSIS — Z8589 Personal history of malignant neoplasm of other organs and systems: Secondary | ICD-10-CM

## 2017-06-23 DIAGNOSIS — T8130XA Disruption of wound, unspecified, initial encounter: Secondary | ICD-10-CM | POA: Diagnosis present

## 2017-06-23 DIAGNOSIS — M1990 Unspecified osteoarthritis, unspecified site: Secondary | ICD-10-CM | POA: Diagnosis present

## 2017-06-23 DIAGNOSIS — L89152 Pressure ulcer of sacral region, stage 2: Secondary | ICD-10-CM | POA: Diagnosis present

## 2017-06-23 DIAGNOSIS — Z9221 Personal history of antineoplastic chemotherapy: Secondary | ICD-10-CM

## 2017-06-23 DIAGNOSIS — Z8541 Personal history of malignant neoplasm of cervix uteri: Secondary | ICD-10-CM

## 2017-06-23 DIAGNOSIS — G9340 Encephalopathy, unspecified: Secondary | ICD-10-CM

## 2017-06-23 DIAGNOSIS — I959 Hypotension, unspecified: Secondary | ICD-10-CM | POA: Diagnosis not present

## 2017-06-23 DIAGNOSIS — K651 Peritoneal abscess: Secondary | ICD-10-CM | POA: Diagnosis not present

## 2017-06-23 DIAGNOSIS — D62 Acute posthemorrhagic anemia: Secondary | ICD-10-CM | POA: Diagnosis not present

## 2017-06-23 DIAGNOSIS — R6520 Severe sepsis without septic shock: Secondary | ICD-10-CM | POA: Diagnosis present

## 2017-06-23 DIAGNOSIS — N390 Urinary tract infection, site not specified: Secondary | ICD-10-CM | POA: Diagnosis not present

## 2017-06-23 DIAGNOSIS — K922 Gastrointestinal hemorrhage, unspecified: Secondary | ICD-10-CM | POA: Diagnosis not present

## 2017-06-23 DIAGNOSIS — R159 Full incontinence of feces: Secondary | ICD-10-CM | POA: Diagnosis present

## 2017-06-23 DIAGNOSIS — R4182 Altered mental status, unspecified: Secondary | ICD-10-CM | POA: Diagnosis not present

## 2017-06-23 DIAGNOSIS — Z6835 Body mass index (BMI) 35.0-35.9, adult: Secondary | ICD-10-CM

## 2017-06-23 DIAGNOSIS — C539 Malignant neoplasm of cervix uteri, unspecified: Secondary | ICD-10-CM | POA: Diagnosis not present

## 2017-06-23 DIAGNOSIS — Z794 Long term (current) use of insulin: Secondary | ICD-10-CM

## 2017-06-23 DIAGNOSIS — R0600 Dyspnea, unspecified: Secondary | ICD-10-CM

## 2017-06-23 DIAGNOSIS — D72829 Elevated white blood cell count, unspecified: Secondary | ICD-10-CM

## 2017-06-23 DIAGNOSIS — Z888 Allergy status to other drugs, medicaments and biological substances status: Secondary | ICD-10-CM

## 2017-06-23 DIAGNOSIS — Z978 Presence of other specified devices: Secondary | ICD-10-CM

## 2017-06-23 DIAGNOSIS — R569 Unspecified convulsions: Secondary | ICD-10-CM | POA: Diagnosis not present

## 2017-06-23 DIAGNOSIS — B952 Enterococcus as the cause of diseases classified elsewhere: Secondary | ICD-10-CM | POA: Diagnosis present

## 2017-06-23 DIAGNOSIS — C799 Secondary malignant neoplasm of unspecified site: Secondary | ICD-10-CM | POA: Diagnosis not present

## 2017-06-23 DIAGNOSIS — A419 Sepsis, unspecified organism: Secondary | ICD-10-CM | POA: Diagnosis not present

## 2017-06-23 DIAGNOSIS — Z09 Encounter for follow-up examination after completed treatment for conditions other than malignant neoplasm: Secondary | ICD-10-CM

## 2017-06-23 DIAGNOSIS — N179 Acute kidney failure, unspecified: Secondary | ICD-10-CM | POA: Diagnosis not present

## 2017-06-23 DIAGNOSIS — K559 Vascular disorder of intestine, unspecified: Secondary | ICD-10-CM | POA: Diagnosis present

## 2017-06-23 DIAGNOSIS — I639 Cerebral infarction, unspecified: Secondary | ICD-10-CM | POA: Diagnosis not present

## 2017-06-23 DIAGNOSIS — G40919 Epilepsy, unspecified, intractable, without status epilepticus: Secondary | ICD-10-CM | POA: Diagnosis not present

## 2017-06-23 DIAGNOSIS — Z87891 Personal history of nicotine dependence: Secondary | ICD-10-CM

## 2017-06-23 DIAGNOSIS — E876 Hypokalemia: Secondary | ICD-10-CM

## 2017-06-23 DIAGNOSIS — L89322 Pressure ulcer of left buttock, stage 2: Secondary | ICD-10-CM | POA: Diagnosis present

## 2017-06-23 DIAGNOSIS — R40241 Glasgow coma scale score 13-15, unspecified time: Secondary | ICD-10-CM | POA: Diagnosis not present

## 2017-06-23 DIAGNOSIS — K921 Melena: Secondary | ICD-10-CM | POA: Insufficient documentation

## 2017-06-23 DIAGNOSIS — Z85038 Personal history of other malignant neoplasm of large intestine: Secondary | ICD-10-CM

## 2017-06-23 DIAGNOSIS — G40909 Epilepsy, unspecified, not intractable, without status epilepticus: Secondary | ICD-10-CM | POA: Diagnosis present

## 2017-06-23 DIAGNOSIS — R627 Adult failure to thrive: Secondary | ICD-10-CM | POA: Diagnosis not present

## 2017-06-23 DIAGNOSIS — K56609 Unspecified intestinal obstruction, unspecified as to partial versus complete obstruction: Secondary | ICD-10-CM | POA: Diagnosis present

## 2017-06-23 DIAGNOSIS — I6300 Cerebral infarction due to thrombosis of unspecified precerebral artery: Secondary | ICD-10-CM

## 2017-06-23 DIAGNOSIS — I6201 Nontraumatic acute subdural hemorrhage: Secondary | ICD-10-CM | POA: Diagnosis not present

## 2017-06-23 DIAGNOSIS — E43 Unspecified severe protein-calorie malnutrition: Secondary | ICD-10-CM | POA: Diagnosis present

## 2017-06-23 DIAGNOSIS — G92 Toxic encephalopathy: Secondary | ICD-10-CM | POA: Diagnosis not present

## 2017-06-23 DIAGNOSIS — E785 Hyperlipidemia, unspecified: Secondary | ICD-10-CM | POA: Diagnosis present

## 2017-06-23 DIAGNOSIS — S065XAA Traumatic subdural hemorrhage with loss of consciousness status unknown, initial encounter: Secondary | ICD-10-CM

## 2017-06-23 DIAGNOSIS — F329 Major depressive disorder, single episode, unspecified: Secondary | ICD-10-CM | POA: Diagnosis present

## 2017-06-23 DIAGNOSIS — R29810 Facial weakness: Secondary | ICD-10-CM | POA: Diagnosis not present

## 2017-06-23 DIAGNOSIS — L8990 Pressure ulcer of unspecified site, unspecified stage: Secondary | ICD-10-CM | POA: Insufficient documentation

## 2017-06-23 DIAGNOSIS — Z91048 Other nonmedicinal substance allergy status: Secondary | ICD-10-CM

## 2017-06-23 DIAGNOSIS — I10 Essential (primary) hypertension: Secondary | ICD-10-CM

## 2017-06-23 DIAGNOSIS — R400 Somnolence: Secondary | ICD-10-CM | POA: Diagnosis not present

## 2017-06-23 DIAGNOSIS — Z7189 Other specified counseling: Secondary | ICD-10-CM

## 2017-06-23 DIAGNOSIS — R188 Other ascites: Secondary | ICD-10-CM | POA: Diagnosis not present

## 2017-06-23 DIAGNOSIS — L89302 Pressure ulcer of unspecified buttock, stage 2: Secondary | ICD-10-CM | POA: Diagnosis not present

## 2017-06-23 DIAGNOSIS — E669 Obesity, unspecified: Secondary | ICD-10-CM | POA: Diagnosis present

## 2017-06-23 DIAGNOSIS — K219 Gastro-esophageal reflux disease without esophagitis: Secondary | ICD-10-CM | POA: Diagnosis present

## 2017-06-23 DIAGNOSIS — Z515 Encounter for palliative care: Secondary | ICD-10-CM | POA: Diagnosis not present

## 2017-06-23 DIAGNOSIS — Y848 Other medical procedures as the cause of abnormal reaction of the patient, or of later complication, without mention of misadventure at the time of the procedure: Secondary | ICD-10-CM | POA: Diagnosis present

## 2017-06-23 DIAGNOSIS — E119 Type 2 diabetes mellitus without complications: Secondary | ICD-10-CM | POA: Diagnosis present

## 2017-06-23 DIAGNOSIS — Z91041 Radiographic dye allergy status: Secondary | ICD-10-CM

## 2017-06-23 DIAGNOSIS — D630 Anemia in neoplastic disease: Secondary | ICD-10-CM | POA: Diagnosis present

## 2017-06-23 DIAGNOSIS — K6389 Other specified diseases of intestine: Secondary | ICD-10-CM | POA: Diagnosis not present

## 2017-06-23 DIAGNOSIS — Z7982 Long term (current) use of aspirin: Secondary | ICD-10-CM

## 2017-06-23 DIAGNOSIS — D6859 Other primary thrombophilia: Secondary | ICD-10-CM | POA: Diagnosis not present

## 2017-06-23 DIAGNOSIS — Z91040 Latex allergy status: Secondary | ICD-10-CM

## 2017-06-23 DIAGNOSIS — E039 Hypothyroidism, unspecified: Secondary | ICD-10-CM | POA: Diagnosis present

## 2017-06-23 DIAGNOSIS — K625 Hemorrhage of anus and rectum: Secondary | ICD-10-CM | POA: Diagnosis not present

## 2017-06-23 DIAGNOSIS — G9341 Metabolic encephalopathy: Secondary | ICD-10-CM | POA: Diagnosis not present

## 2017-06-23 DIAGNOSIS — S065X9A Traumatic subdural hemorrhage with loss of consciousness of unspecified duration, initial encounter: Secondary | ICD-10-CM

## 2017-06-23 DIAGNOSIS — R9401 Abnormal electroencephalogram [EEG]: Secondary | ICD-10-CM | POA: Diagnosis present

## 2017-06-23 DIAGNOSIS — Z923 Personal history of irradiation: Secondary | ICD-10-CM

## 2017-06-23 LAB — COMPREHENSIVE METABOLIC PANEL
ALBUMIN: 1.3 g/dL — AB (ref 3.5–5.0)
ALT: 11 U/L — ABNORMAL LOW (ref 14–54)
ANION GAP: 8 (ref 5–15)
AST: 18 U/L (ref 15–41)
Alkaline Phosphatase: 89 U/L (ref 38–126)
BUN: 9 mg/dL (ref 6–20)
CALCIUM: 7.3 mg/dL — AB (ref 8.9–10.3)
CO2: 29 mmol/L (ref 22–32)
Chloride: 103 mmol/L (ref 101–111)
Creatinine, Ser: 0.65 mg/dL (ref 0.44–1.00)
GFR calc Af Amer: >60 mL/min (ref >60)
GFR calc non Af Amer: >60 mL/min (ref >60)
GLUCOSE: 167 mg/dL — AB (ref 65–99)
Potassium: 3.3 mmol/L — ABNORMAL LOW (ref 3.5–5.1)
SODIUM: 140 mmol/L (ref 135–145)
Total Bilirubin: 0.4 mg/dL (ref 0.3–1.2)
Total Protein: 5.3 g/dL — ABNORMAL LOW (ref 6.5–8.1)

## 2017-06-23 LAB — CBC WITH DIFFERENTIAL/PLATELET
BASOS ABS: 0 10*3/uL (ref 0.0–0.1)
BASOS PCT: 0 %
EOS ABS: 0 10*3/uL (ref 0.0–0.7)
Eosinophils Relative: 0 %
HCT: 34.2 % — ABNORMAL LOW (ref 36.0–46.0)
Hemoglobin: 10.5 g/dL — ABNORMAL LOW (ref 12.0–15.0)
Lymphocytes Relative: 10 %
Lymphs Abs: 1 10*3/uL (ref 0.7–4.0)
MCH: 26.6 pg (ref 26.0–34.0)
MCHC: 30.7 g/dL (ref 30.0–36.0)
MCV: 86.8 fL (ref 78.0–100.0)
MONOS PCT: 6 %
Monocytes Absolute: 0.7 10*3/uL (ref 0.1–1.0)
NEUTROS PCT: 84 %
Neutro Abs: 8.5 10*3/uL — ABNORMAL HIGH (ref 1.7–7.7)
Platelets: 434 10*3/uL — ABNORMAL HIGH (ref 150–400)
RBC: 3.94 MIL/uL (ref 3.87–5.11)
RDW: 16.1 % — ABNORMAL HIGH (ref 11.5–15.5)
WBC: 10.3 10*3/uL (ref 4.0–10.5)

## 2017-06-23 LAB — POC OCCULT BLOOD, ED: Fecal Occult Bld: POSITIVE — AB

## 2017-06-23 MED ORDER — PIPERACILLIN-TAZOBACTAM 3.375 G IVPB
3.3750 g | Freq: Three times a day (TID) | INTRAVENOUS | Status: DC
  Administered 2017-06-24 – 2017-06-29 (×16): 3.375 g via INTRAVENOUS
  Filled 2017-06-23 (×17): qty 50

## 2017-06-23 MED ORDER — INSULIN ASPART 100 UNIT/ML ~~LOC~~ SOLN
0.0000 [IU] | SUBCUTANEOUS | Status: DC
  Administered 2017-06-24 (×2): 3 [IU] via SUBCUTANEOUS
  Administered 2017-06-26: 2 [IU] via SUBCUTANEOUS
  Administered 2017-06-27: 3 [IU] via SUBCUTANEOUS
  Administered 2017-06-27 (×2): 2 [IU] via SUBCUTANEOUS
  Administered 2017-06-28: 3 [IU] via SUBCUTANEOUS
  Administered 2017-06-28 (×3): 2 [IU] via SUBCUTANEOUS
  Administered 2017-06-28: 3 [IU] via SUBCUTANEOUS
  Administered 2017-06-29 – 2017-07-12 (×28): 2 [IU] via SUBCUTANEOUS
  Administered 2017-07-12 (×2): 3 [IU] via SUBCUTANEOUS
  Administered 2017-07-12 – 2017-07-13 (×5): 2 [IU] via SUBCUTANEOUS

## 2017-06-23 MED ORDER — METOPROLOL TARTRATE 25 MG PO TABS
25.0000 mg | ORAL_TABLET | Freq: Two times a day (BID) | ORAL | Status: DC
  Administered 2017-06-24 – 2017-06-30 (×11): 25 mg via ORAL
  Filled 2017-06-23 (×12): qty 1

## 2017-06-23 MED ORDER — PANTOPRAZOLE SODIUM 40 MG IV SOLR
40.0000 mg | INTRAVENOUS | Status: DC
  Administered 2017-06-24: 40 mg via INTRAVENOUS
  Filled 2017-06-23: qty 40

## 2017-06-23 MED ORDER — ONDANSETRON HCL 4 MG/2ML IJ SOLN
4.0000 mg | Freq: Four times a day (QID) | INTRAMUSCULAR | Status: DC | PRN
  Administered 2017-07-29 – 2017-08-09 (×3): 4 mg via INTRAVENOUS
  Filled 2017-06-23 (×3): qty 2

## 2017-06-23 MED ORDER — ADULT MULTIVITAMIN W/MINERALS CH
1.0000 | ORAL_TABLET | Freq: Every day | ORAL | Status: DC
  Administered 2017-06-24: 1 via ORAL
  Filled 2017-06-23: qty 1

## 2017-06-23 MED ORDER — DEXTROSE-NACL 5-0.45 % IV SOLN
INTRAVENOUS | Status: DC
  Administered 2017-06-24 – 2017-06-26 (×3): via INTRAVENOUS

## 2017-06-23 MED ORDER — POTASSIUM CHLORIDE CRYS ER 20 MEQ PO TBCR
20.0000 meq | EXTENDED_RELEASE_TABLET | Freq: Every day | ORAL | Status: DC
  Administered 2017-06-24: 20 meq via ORAL
  Filled 2017-06-23: qty 1

## 2017-06-23 MED ORDER — ACETAMINOPHEN 325 MG PO TABS: 650.0000 mg | ORAL_TABLET | Freq: Four times a day (QID) | ORAL | Status: DC | PRN

## 2017-06-23 MED ORDER — ENSURE ENLIVE PO LIQD: 237.0000 mL | Freq: Two times a day (BID) | ORAL | Status: DC

## 2017-06-23 MED ORDER — ACETAMINOPHEN 650 MG RE SUPP: 650.0000 mg | Freq: Four times a day (QID) | RECTAL | Status: DC | PRN

## 2017-06-23 MED ORDER — SODIUM CHLORIDE 0.9% FLUSH
3.0000 mL | Freq: Two times a day (BID) | INTRAVENOUS | Status: DC
  Administered 2017-06-24 (×2): 3 mL via INTRAVENOUS

## 2017-06-23 MED ORDER — CALCIUM CARBONATE ANTACID 500 MG PO CHEW
1.0000 | CHEWABLE_TABLET | Freq: Two times a day (BID) | ORAL | Status: DC
  Administered 2017-06-24: 200 mg via ORAL
  Filled 2017-06-23: qty 1

## 2017-06-23 MED ORDER — GABAPENTIN 300 MG PO CAPS
300.0000 mg | ORAL_CAPSULE | Freq: Every day | ORAL | Status: DC
  Administered 2017-06-24 – 2017-06-27 (×5): 300 mg via ORAL
  Filled 2017-06-23 (×5): qty 1

## 2017-06-23 MED ORDER — ALUM & MAG HYDROXIDE-SIMETH 200-200-20 MG/5ML PO SUSP
30.0000 mL | Freq: Four times a day (QID) | ORAL | Status: DC | PRN
  Administered 2017-07-09 – 2017-07-21 (×3): 30 mL via ORAL
  Filled 2017-06-23 (×3): qty 30

## 2017-06-23 MED ORDER — ONDANSETRON HCL 4 MG PO TABS: 4.0000 mg | ORAL_TABLET | Freq: Four times a day (QID) | ORAL | Status: DC | PRN

## 2017-06-23 MED ORDER — PIPERACILLIN-TAZOBACTAM 3.375 G IVPB 30 MIN
3.3750 g | Freq: Once | INTRAVENOUS | Status: AC
  Administered 2017-06-24: 3.375 g via INTRAVENOUS

## 2017-06-23 MED ORDER — PRAVASTATIN SODIUM 10 MG PO TABS
20.0000 mg | ORAL_TABLET | Freq: Every day | ORAL | Status: DC
  Administered 2017-06-24: 20 mg via ORAL
  Filled 2017-06-23: qty 2

## 2017-06-23 NOTE — ED Provider Notes (Signed)
[facility] DEPT Provider Note   CSN: [id number] Arrival date & time: 06/23/17  1711     History   Chief Complaint Chief Complaint  Patient presents with  . Blood In Stools    HPI Lori [name] is a 66 y.o. female who presents for evaluation of bloody stools. She currently resides at the [facility] in [place]. PMH significant for cervical cancer, endometrial cancer, hx of cerebral aneurysm, Type 2 DM. She was recently hospitalized from 6/26-7/6 for SBO. She underwent diagnostic laparoscopy with peritoneal nodule biopsy, drainage of abscesses, repair of small bowel perforation, repair of small bowel enterotomy, enterocolostomy (mid ileum to mid transverse colon). She has a wound vac over her surgical wound. Surgeon was Dr. [name]. She was transferred to a SNF after surgery. Today it was noted she had blood in her stool. The patient has no knowledge of this and states she feels fine. She reports she has had diarrhea since her surgery and had an episode of abdominal pain earlier but it's gone now. She states she wants to go home. She is not on blood thinners.  HPI  Past Medical History:  Diagnosis Date  . Anemia 2017   3u blood 2017  . Arthritis    KNEES  . 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 This is raised a concern of malignancy. We'll plan to move forward with laparoscopic possible open appendectomy to rule out malignancy.   . Cervical cancer, FIGO stage IIB ([place]) 05/24/2015  . 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 Depression   . Endometrial carcinoma [facility])    goes to [facility] in [place] for chemotherapy  . GERD (gastroesophageal reflux disease)   . Heart murmur   . History of blood  transfusion   . 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 History of radiation therapy 07/28/15-08/26/15   cervical region 27.5 gray  . History of seizures    post-op craniotomy for aneurysm 1997-- controlled w/ medication since then no seizures  . Hyperlipidemia   . Hypertension   . PMB (postmenopausal bleeding)   . Pulmonary nodule 03/26/2017  . Seizures ([place])    hx of years ago   . Type 2 diabetes, diet controlled ([place])    diet controlled     Patient Active Problem List   Diagnosis Date Noted  . Severe sepsis with septic shock ([place])   . Peritoneal carcinomatosis ([place])   . Seizure ([place])   . SBO (small bowel obstruction) ([place]) 05/15/2017  . HTN (hypertension) 05/15/2017  . Pulmonary nodule 03/26/2017  . 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 Cervical cancer, FIGO stage IIB ([place]) 05/24/2015    Past Surgical History:  Procedure Laterality Date  . COLONOSCOPY    . CRANIOTOMY POSTERIOR FOSSA  1997   Repair aneurysm  x2  left side  . LAPAROSCOPIC APPENDECTOMY N/A 04/19/2016   Procedure: APPENDECTOMY LAPAROSCOPIC;  Surgeon: Hall Busing, MD;  Location: [facility];  Service: General;  Laterality: N/A;  . LAPAROSCOPY N/A 05/30/2017   Procedure: LAPAROSCOPY DIAGNOSTIC WITH PERITONEAL BIOPSY;  Surgeon: Jackolyn Confer, MD;  Location:  [facility] OR[facility];  Service: General;  Laterality: N/A;  . LAPAROTOMY  05/30/2017   Procedure: EXPLORATORY LAPAROTOMY REPAIR WITH DRAINAGE OF INTRA ABDOMINAL ABSCESS OF SMALL BOWEL PERFORATION WITH ENTEROCOLOSTOMY;  Surgeon: Jackolyn Confer, MD;  Location: [facility];  Service: General;;  . PORT-A-CATH PLACEMENT  06/  2016  . TANDEM RING INSERTION N/A 07/28/2015   Procedure: TANDEM RING PLACEMENT;  Surgeon: Gery Pray, MD;  Location: [facility];  Service: Urology;  Laterality: N/A;  . TANDEM RING  INSERTION N/A 08/05/2015   Procedure: TANDEM RING PLACEMENT ;  Surgeon: Gery Pray, MD;  Location: [facility];  Service: Urology;  Laterality: N/A;  . TANDEM RING INSERTION N/A 08/11/2015   Procedure: TANDEM RING PLACEMENT ;  Surgeon: Gery Pray, MD;  Location: [facility];  Service: Urology;  Laterality: N/A;  . TANDEM RING INSERTION N/A 08/17/2015   Procedure: TANDEM RING PLACEMENT ;  Surgeon: Gery Pray, MD;  Location: [facility];  Service: Urology;  Laterality: N/A;  . TANDEM RING INSERTION N/A 08/26/2015   Procedure: TANDEM RING PLACEMENT ;  Surgeon: Gery Pray, MD;  Location: [facility];  Service: Urology;  Laterality: N/A;    OB History    No data available       Home Medications    Prior to Admission medications   Medication Sig Start Date End Date Taking? Authorizing Provider  acetaminophen (TYLENOL) 325 MG tablet Take 650 mg by mouth every 6 (six) hours as needed (pain).   Yes [provider]  alum & mag hydroxide-simeth (MAALOX/MYLANTA) 200-200-20 MG/5ML suspension Take 30 mLs by mouth every 6 (six) hours as needed for indigestion or heartburn. 06/08/17  Yes Arrien, Jimmy Picket, MD  aspirin EC 81 MG tablet Take 81 mg by mouth daily.   Yes [provider]  calcium carbonate (TUMS - DOSED IN MG ELEMENTAL CALCIUM) 500 MG chewable tablet Chew 1 tablet by mouth 2 (two) times daily.   Yes [provider]  cefUROXime (CEFTIN) 500 MG tablet Take 500 mg by mouth See admin instructions. Take 1 tablet (500 mg) by mouth twice daily for UTI - 7 day course started 06/22/17 am   Yes [provider]  cholecalciferol (VITAMIN D) 1000 units tablet Take 1,000 Units by mouth daily.   Yes [provider]  cholecalciferol (VITAMIN D) 400 units TABS tablet Take 400 Units by mouth 2 (two) times daily.   Yes [provider]  famotidine (PEPCID) 20 MG tablet Take 1 tablet (20 mg total)  by mouth daily. 06/09/17 07/09/17 Yes Arrien, Jimmy Picket, MD  insulin aspart (NOVOLOG) 100 UNIT/ML injection Inject 0-20 Units into the skin every 4 (four) hours. Glucose 150-200 give 2 units, 201-250 give 4 units, 251-300 give 6 units, 301-350 give 8 units, 351 or greater give 10 units. Patient taking differently: Inject 0-10 Units into the skin See admin instructions. Inject 0-10 units twice daily (7:30am and 8:30pm) per sliding scale: CBG 0-149 0 units, 150-200 2 units, 201-250 4 units, 251-300 6 units, 301-350 8 units, 351-450 10 units 06/08/17  Yes Arrien, Jimmy Picket, MD  magnesium oxide (MAG-OX) 400 (241.3 Mg) MG tablet Take 400 mg by mouth daily.  02/28/16  Yes [provider]  metoprolol tartrate (LOPRESSOR) 25 MG tablet Take 25 mg by mouth 2 (two) times daily.   Yes [provider]  Multiple Vitamin (MULTIVITAMIN WITH MINERALS) TABS tablet Take 1 tablet by mouth at bedtime.   Yes [provider]  potassium chloride SA (K-DUR,KLOR-CON) 20 MEQ tablet Take 20 mEq by mouth daily.  04/29/15  Yes [provider]  pravastatin (PRAVACHOL) 20 MG tablet Take 20 mg by mouth at bedtime.  04/29/15  Yes [provider]  vitamin C (ASCORBIC ACID) 500 MG tablet Take 500 mg by mouth See admin instructions. Take 1 tablet (500 mg) by mouth daily for 28 days for wound healing - stop date 07/19/17   Yes [provider]  zinc gluconate 50 MG tablet Take 50 mg by mouth See admin instructions. Take 1 tablet (50 mg) by mouth daily for 14 days for wound healing - stop date 07/05/17   Yes [provider]  acetaminophen (TYLENOL) 500 MG tablet Take 1 tablet (500 mg total) by mouth every 6 (six) hours as needed for moderate pain. Patient not taking: Reported on 06/23/2017 06/08/17   Arrien, Jimmy Picket, MD  feeding supplement (BOOST / RESOURCE BREEZE) LIQD Take 1 Container by mouth 2 (two) times daily between meals. 06/08/17 07/08/17  Arrien, Jimmy Picket, MD    feeding supplement, ENSURE ENLIVE, (ENSURE ENLIVE) LIQD Take 237 mLs by mouth 2 (two) times daily between meals. 06/08/17   Arrien, Jimmy Picket, MD  gabapentin (NEURONTIN) 300 MG capsule Take 300 mg by mouth 2 (two) times daily.  04/29/15   [provider]  metoprolol (LOPRESSOR) 50 MG tablet Take 25 mg by mouth 2 (two) times daily.  04/29/15   [provider]    Family History Family History  Problem Relation Age of Onset  . Throat cancer Father   . Cervical cancer Sister     Social History Social History  Substance Use Topics  . Smoking status: Former Smoker    Packs/day: 0.25    Years: 5.00    Types: Cigarettes    Quit date: 12/05/1995  . Smokeless tobacco: Never Used  . Alcohol use No     Allergies   Contrast media [iodinated diagnostic agents]; Dilantin [phenytoin sodium extended]; Latex; and Adhesive [tape]   Review of Systems Review of Systems  Constitutional: Negative for chills and fever.  Respiratory: Negative for shortness of breath.   Cardiovascular: Negative for chest pain.  Gastrointestinal: Positive for abdominal pain (resolved), blood in stool and diarrhea. Negative for nausea and vomiting.  All other systems reviewed and are negative.    Physical Exam Updated Vital Signs BP (!) 166/101   Pulse 94   Temp 99.9 F (37.7 C) (Oral)   Resp (!) 28   SpO2 98%   Physical Exam  Constitutional: She is oriented to person, place, and time. She appears well-developed and well-nourished. No distress.  Calm, disoriented and uncooperative at times  HENT:  Head: Normocephalic and atraumatic.  Eyes: Pupils are equal, round, and reactive to light. Conjunctivae are normal. Right eye exhibits no discharge. Left eye exhibits no discharge. No scleral icterus.  Neck: Normal range of motion.  Cardiovascular: Normal rate and regular rhythm.  Exam reveals no gallop and no friction rub.   No murmur heard. Pulmonary/Chest: Effort normal. Tachypnea noted.  No respiratory distress. She has no wheezes. She has no rales. She exhibits no tenderness.  Abdominal: Soft. Bowel sounds are normal. She exhibits no distension and no mass. There is no tenderness. There is no rebound and no guarding. No hernia.  Abdomen is firm but non-tender. Wound vac in place  Genitourinary:  Genitourinary Comments: Rectal: Large amount of melanotic stool with clots  Neurological: She is alert and  oriented to person, place, and time.  Skin: Skin is warm and dry.  Psychiatric: She has a normal mood and affect. Her behavior is normal.  Nursing note and vitals reviewed.    ED Treatments / Results  Labs (all labs ordered are listed, but only abnormal results are displayed) Labs Reviewed  CBC WITH DIFFERENTIAL/PLATELET - Abnormal; Notable for the following:       Result Value   Hemoglobin 10.5 (*)    HCT 34.2 (*)    RDW 16.1 (*)    Platelets 434 (*)    Neutro Abs 8.5 (*)    All other components within normal limits  COMPREHENSIVE METABOLIC PANEL - Abnormal; Notable for the following:    Potassium 3.3 (*)    Glucose, Bld 167 (*)    Calcium 7.3 (*)    Total Protein 5.3 (*)    Albumin 1.3 (*)    ALT 11 (*)    All other components within normal limits  POC OCCULT BLOOD, ED - Abnormal; Notable for the following:    Fecal Occult Bld POSITIVE (*)    All other components within normal limits  TYPE AND SCREEN  ABO/RH    EKG  EKG Interpretation None       Radiology Ct Abdomen Pelvis Wo Contrast  Result Date: 06/23/2017 CLINICAL DATA:  GI bleed. Melena. History of cervical cancer. History of appendiceal cancer. EXAM: CT ABDOMEN AND PELVIS WITHOUT CONTRAST TECHNIQUE: Multidetector CT imaging of the abdomen and pelvis was performed following the standard protocol without IV contrast. COMPARISON:  05/15/2017 FINDINGS: Lower chest: The heart is enlarged. Tiny pericardial effusion is similar to prior. There is dependent atelectasis in the lower lungs with moderate  right and small left pleural effusions. Hepatobiliary: No focal abnormality in the liver on this study without intravenous contrast. Gallbladder is decompressed. The possible tiny stone in the gallbladder neck. No intrahepatic or extrahepatic biliary dilation. Pancreas: No focal mass lesion. No dilatation of the main duct. No intraparenchymal cyst. No peripancreatic edema. Spleen: No splenomegaly. No focal mass lesion. Adrenals/Urinary Tract: No adrenal nodule or mass. Left kidney is atrophic as before. There is new bilateral mild hydroureteronephrosis. The bladder is distended. Tiny air bubble in the bladder lumen may be related to recent instrumentation although bladder infection can cause this appearance. Stomach/Bowel: Stomach is mildly distended and fluid-filled. Duodenum unremarkable. No overt small bowel obstruction. The terminal ileum is normal. Suture line in the region of the mid transverse colon is compatible with the clinical history of enterocolostomy from the mid ileum to the mid transverse colon. Left colon unremarkable. There is edema a congestion of the small bowel mesentery with small pockets of interloop mesenteric fluid. Vascular/Lymphatic: There is abdominal aortic atherosclerosis without aneurysm. There is no gastrohepatic or hepatoduodenal ligament lymphadenopathy. No intraperitoneal or retroperitoneal lymphadenopathy. No pelvic sidewall lymphadenopathy. Reproductive: Uterus appears enlarged. Endometrium is thickened in this patient with a history of cervical cancer. There is no adnexal mass. Other: Focal fluid collection is identified in the right lower quadrant. This is difficult to assess without oral or intravenous contrast material, but it appears to have a circumferential rim and does not appear to represent bowel. There is another fluid collection inferior to the liver tracking down the right peritoneal cavity and into the right para colic gutter. This collection measures 8.8 cm in  craniocaudal length by 7.4 cm and AP dimension by 2.5 cm coronally. This also has a rim of increased attenuation raising concern for abscess. Musculoskeletal: Open  midline wound identified with packing material. Diffuse body wall edema evident. Stable appearance sclerotic lesion right acetabulum. Bone windows reveal no worrisome lytic or sclerotic osseous lesions. IMPRESSION: 1. Patient is approximately 3 weeks out from diagnostic laparoscopy with abscess drainage, repair of small bowel perforation, and entero colostomy. On today's exam, there are 2 apparent fluid collections identified, but are difficult to assess given the lack of intravenous and oral contrast. One of these is just inferior to the liver in tracks down the right paracolic gutter. The second is in the right lower quadrant mesentery. Given the history of recent surgery both could represent intraperitoneal abscess. However, with the history of appendiceal carcinoma, pseudomyxoma peritonei is also a consideration. 2. No evidence for bowel obstruction. No bowel wall thickening is evident. There is fairly diffuse mesenteric edema and vascular congestion with scattered collections of interloop mesenteric fluid. The degree of these changes is more advanced than would be expected 3 weeks out from surgery. 3. Dependent atelectasis in both lower lungs with moderate right and small left pleural effusions. 4. Tiny air bubble in the urinary bladder may be from recent instrumentation, but bladder infection can have this appearance. 5. Thickening of the endometrial stripe in this patient with history of cervical cancer. Electronically Signed   By: Misty Stanley M.D.   On: 06/23/2017 20:32    Procedures Procedures (including critical care time)  Medications Ordered in ED Medications - No data to display   Initial Impression / Assessment and Plan / ED Course  I have reviewed the triage vital signs and the nursing notes.  Pertinent labs & imaging results  that were available during my care of the patient were reviewed by me and considered in my medical decision making (see chart for details).  66 year old female with GI hemorrhage starting today. She is hypertensive but otherwise vitals are normal initially. She has become more tachycardic throughout her ED stay. Abdomen is distended and firm. Rectal exam reveals a large amount of bloody dark stool with clots. Unfortunately it took a long time to establish IV access and obtain labs. In the meantime CT scan was obtained which shows 2 fluid collections. General surgery was consulted who will come to see patient. CBC finally resulted and reveals hgb of 10.5. CMP remarkable for hypokalemia (3.3), hyperglycemia (167), hypocalecmia (7.3), and low protein. Zosyn was ordered. Shared visit with Dr. Lacinda Axon. Will admit to medicine.   Final Clinical Impressions(s) / ED Diagnoses   Final diagnoses:  Gastrointestinal hemorrhage with melena    New Prescriptions New Prescriptions   No medications on file     Iris Pert 06/23/17 2248    Nat Christen, MD 06/27/17 [phone]

## 2017-06-23 NOTE — ED Notes (Signed)
Nurse starting IV and will draw labs. 

## 2017-06-23 NOTE — ED Notes (Signed)
Attempted to call RN not able to take report at this time

## 2017-06-23 NOTE — Progress Notes (Signed)
Pharmacy Antibiotic Note  Barbar Brede is a 66 y.o. female admitted on 06/23/2017 with intra-abdominal infection.  Pharmacy has been consulted for zosyn dosing. Patient has significant history of cervical cancer and appendiceal cancer with abdominal carcinomatosis and recent small bowel obstruction exploratory laparotomy, drainage of intra-abdominal abscess, and repair of small bowel perforation.  Plan: Zosyn 3.375g IV q8h (4 hour infusion).  F/U clinical progression and LOT     Temp (24hrs), Avg:99.9 F (37.7 C), Min:99.9 F (37.7 C), Max:99.9 F (37.7 C)   Recent Labs Lab 06/23/17 2121  WBC 10.3  CREATININE 0.65    CrCl cannot be calculated (Unknown ideal weight.).    Allergies  Allergen Reactions  . Contrast Media [Iodinated Diagnostic Agents] Itching and Swelling  . Dilantin [Phenytoin Sodium Extended] Itching, Swelling and Other (See Comments)    Whole body  . Latex Hives and Itching  . Adhesive [Tape] Rash    Antimicrobials this admission: 7/21 Zosyn>> Dose adjustments this admission:   Microbiology results:  Thank you for allowing pharmacy to be a part of this patient's care.  Jodean Lima Zayla Agar 06/23/2017 11:00 PM

## 2017-06-23 NOTE — ED Notes (Signed)
Patient very poor historian.

## 2017-06-23 NOTE — ED Notes (Signed)
Pt was cleaned and changed with a new brief

## 2017-06-23 NOTE — ED Triage Notes (Signed)
Patient arrived to ED via [facility] from [facility]. EMS reports:  Patient sent to ED for further eval by PCP after reported dark black stools x 2.  BP 164/86, Pulse 90, Resp 18, 98% on room air.

## 2017-06-23 NOTE — Progress Notes (Signed)
Patient arrived to floor from ED. Report received from [place], [place]. Patient alert and oriented. Patient has on diaper from home. Son at bedside.

## 2017-06-23 NOTE — H&P (Signed)
History and Physical    Lori [name] JTT:[id number] DOB: 1951/01/06 DOA: 06/23/2017  PCP: [name], MD  Patient coming from: SNF  Chief Complaint: bloody bowel movement  HPI: Lori [name] is a 66 y.o. female with medical history significant of DM2, Seizures (not on medication), HTN, HLD, GERD, cervical and appendiceal carcinoma, intracranial aneurysm repair in 1997 who presents for rectal bleeding.  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  Most recently, she was admitted on 6/26 for an SBO.  She had a laparotomy at that time for biopsy of a peritoneal node (recurrent appendiceal adenocarcinoma with mucinous features), repair of small bowel perforation, and enterocolostomy.  She was placed on broad spectrum Abx at that time and responded well. She required vasopressors.  She was discharged to SNF with wound vac in place.  She has recently changed SNFs to [facility] 2 days ago.  Today, at the SNF, she was noted to have 2 large bloody bowel movements with clots and was sent to the ED.  The patient reports no symptoms.  She notes that she did not realize she had bled until they told her.  She has fatigue and weakness which is chronic and generalized for which she has been at the SNF, but did not notice any worsening.  She denies dizziness, lightheadedness, falls, chest pain, abdominal pain, diarrhea, constipation, vomiting, hematemesis.  She does note that she has been running a low grade fever and that she is swollen.  Her son, who is in room with her, notes that she has "been talking out of her head," and that 2 weeks ago she required a blood transfusion.  She has a wound vac in place which is clean and dry.  She is a patient of Dr. [name] and has opted to proceed with therapy for her new metastatic adenocarcinoma, but has not had therapy just yet.     I reviewed the notes from  the SNF.  ON 7/16 she had a urine culture which revealed Klebsiella pneumoniae and she was being treated with Ceftin.  She was also on a daily aspirin, KCL, magnesium, pravastatin, metoprolol, pepcid, SSI.  She was listed as being on neurontin, but it was not being given to her.  She did note some chronic burning leg pain.    ED Course: Per report from the ED, she presented with large clots and blood per rectum. FOBT was +.  Her H/H were stable and slightly improved from last check at 10.5 and 34.2 respectively.  She had a K of 3.3, Cr of 0.65, Alb of 1.3.  CT scan of the abdomen showed 2 fluid collections, one near the liver and the second in the RLQ mesentery.  These could also be foci of mucinous metastasis.  She further has no SBO, increased edema and mesenteric fluids that is higher than expected post op.  Small pleural effusion bilaterally.  Air bubble in the bladder and thickened endometrial stripe.    Review of Systems: As per HPI otherwise 10 point review of systems negative.   Past Medical History:  Diagnosis Date  . Anemia 2017   3u blood 2017  . Arthritis    KNEES  . Carcinoma of appendix ([place]) 04/11/2016   Patient with a history of cervical cancer status post chemoradiation with a persistent finding of a dilated appendix with hypermetabolism. This is raised a concern of malignancy. We'll plan to move forward with  laparoscopic possible open appendectomy to rule out malignancy.   . Cervical cancer, FIGO stage IIB ([place]) 05/24/2015  . Cervical carcinoma [facility]) oncologist--  dr Sondra Come for radiation/   [facility] for chemotherapy   endocervical adenocarcinoma w/  Nodal METS--  FIGO Stage IIB  . Depression   . Endometrial carcinoma [facility])    goes to [facility] in [place] for chemotherapy  . GERD (gastroesophageal reflux disease)   . Heart murmur   . History of blood transfusion   . History of cerebral aneurysm repair    1997--  hemarrhagic aneurysm x2 --- s/p   left posterior fossa craniectomy  . History of radiation therapy 07/28/15-08/26/15   cervical region 27.5 gray  . History of seizures    post-op craniotomy for aneurysm 1997-- controlled w/ medication since then no seizures  . Hyperlipidemia   . Hypertension   . PMB (postmenopausal bleeding)   . Pulmonary nodule 03/26/2017  . Seizures ([place])    hx of years ago   . Type 2 diabetes, diet controlled ([place])    diet controlled     Past Surgical History:  Procedure Laterality Date  . COLONOSCOPY    . CRANIOTOMY POSTERIOR FOSSA  1997   Repair aneurysm  x2  left side  . LAPAROSCOPIC APPENDECTOMY N/A 04/19/2016   Procedure: APPENDECTOMY LAPAROSCOPIC;  Surgeon: Hall Busing, MD;  Location: [facility];  Service: General;  Laterality: N/A;  . LAPAROSCOPY N/A 05/30/2017   Procedure: LAPAROSCOPY DIAGNOSTIC WITH PERITONEAL BIOPSY;  Surgeon: Jackolyn Confer, MD;  Location: [facility];  Service: General;  Laterality: N/A;  . LAPAROTOMY  05/30/2017   Procedure: EXPLORATORY LAPAROTOMY REPAIR WITH DRAINAGE OF INTRA ABDOMINAL ABSCESS OF SMALL BOWEL PERFORATION WITH ENTEROCOLOSTOMY;  Surgeon: Jackolyn Confer, MD;  Location: [facility];  Service: General;;  . PORT-A-CATH PLACEMENT  06/  2016  . TANDEM RING INSERTION N/A 07/28/2015   Procedure: TANDEM RING PLACEMENT;  Surgeon: Gery Pray, MD;  Location: [facility];  Service: Urology;  Laterality: N/A;  . TANDEM RING INSERTION N/A 08/05/2015   Procedure: TANDEM RING PLACEMENT ;  Surgeon: Gery Pray, MD;  Location: [facility];  Service: Urology;  Laterality: N/A;  . TANDEM RING INSERTION N/A 08/11/2015   Procedure: TANDEM RING PLACEMENT ;  Surgeon: Gery Pray, MD;  Location: [facility];  Service: Urology;  Laterality: N/A;  . TANDEM RING INSERTION N/A 08/17/2015   Procedure: TANDEM RING PLACEMENT ;  Surgeon: Gery Pray, MD;  Location: [facility];  Service: Urology;  Laterality: N/A;  . TANDEM RING INSERTION  N/A 08/26/2015   Procedure: TANDEM RING PLACEMENT ;  Surgeon: Gery Pray, MD;  Location[facility];  Service: Urology;  Laterality: N/A;   Reviewed with patient.   reports that she quit smoking about 21 years ago. Her smoking use included Cigarettes. She has a 1.25 pack-year smoking history. She has never used smokeless tobacco. She reports that she does not drink alcohol or use drugs.  Allergies  Allergen Reactions  . Contrast Media [Iodinated Diagnostic Agents] Itching and Swelling  . Dilantin [Phenytoin Sodium Extended] Itching, Swelling and Other (See Comments)    Whole body  . Latex Hives and Itching  . Adhesive [Tape] Rash    Family History  Problem Relation Age of Onset  . Throat cancer Father   . Cervical cancer Sister   Also noted to have DM in her father and "all" her sisters  Prior  to Admission medications   Medication Sig Start Date End Date Taking? Authorizing Provider  acetaminophen (TYLENOL) 325 MG tablet Take 650 mg by mouth every 6 (six) hours as needed (pain).   Yes [provider]  alum & mag hydroxide-simeth (MAALOX/MYLANTA) 200-200-20 MG/5ML suspension Take 30 mLs by mouth every 6 (six) hours as needed for indigestion or heartburn. 06/08/17  Yes Arrien, Jimmy Picket, MD  aspirin EC 81 MG tablet Take 81 mg by mouth daily.   Yes [provider]  calcium carbonate (TUMS - DOSED IN MG ELEMENTAL CALCIUM) 500 MG chewable tablet Chew 1 tablet by mouth 2 (two) times daily.   Yes [provider]  cefUROXime (CEFTIN) 500 MG tablet Take 500 mg by mouth See admin instructions. Take 1 tablet (500 mg) by mouth twice daily for UTI - 7 day course started 06/22/17 am   Yes [provider]  cholecalciferol (VITAMIN D) 1000 units tablet Take 1,000 Units by mouth daily.   Yes [provider]  cholecalciferol (VITAMIN D) 400 units TABS tablet Take 400 Units by mouth 2 (two) times daily.   Yes [provider]    famotidine (PEPCID) 20 MG tablet Take 1 tablet (20 mg total) by mouth daily. 06/09/17 07/09/17 Yes Arrien, Jimmy Picket, MD  insulin aspart (NOVOLOG) 100 UNIT/ML injection Inject 0-20 Units into the skin every 4 (four) hours. Glucose 150-200 give 2 units, 201-250 give 4 units, 251-300 give 6 units, 301-350 give 8 units, 351 or greater give 10 units. Patient taking differently: Inject 0-10 Units into the skin See admin instructions. Inject 0-10 units twice daily (7:30am and 8:30pm) per sliding scale: CBG 0-149 0 units, 150-200 2 units, 201-250 4 units, 251-300 6 units, 301-350 8 units, 351-450 10 units 06/08/17  Yes Arrien, Jimmy Picket, MD  magnesium oxide (MAG-OX) 400 (241.3 Mg) MG tablet Take 400 mg by mouth daily.  02/28/16  Yes [provider]  metoprolol tartrate (LOPRESSOR) 25 MG tablet Take 25 mg by mouth 2 (two) times daily.   Yes [provider]  Multiple Vitamin (MULTIVITAMIN WITH MINERALS) TABS tablet Take 1 tablet by mouth at bedtime.   Yes [provider]  potassium chloride SA (K-DUR,KLOR-CON) 20 MEQ tablet Take 20 mEq by mouth daily.  04/29/15  Yes [provider]  pravastatin (PRAVACHOL) 20 MG tablet Take 20 mg by mouth at bedtime.  04/29/15  Yes [provider]  vitamin C (ASCORBIC ACID) 500 MG tablet Take 500 mg by mouth See admin instructions. Take 1 tablet (500 mg) by mouth daily for 28 days for wound healing - stop date 07/19/17   Yes [provider]  zinc gluconate 50 MG tablet Take 50 mg by mouth See admin instructions. Take 1 tablet (50 mg) by mouth daily for 14 days for wound healing - stop date 07/05/17   Yes [provider]  acetaminophen (TYLENOL) 500 MG tablet Take 1 tablet (500 mg total) by mouth every 6 (six) hours as needed for moderate pain. Patient not taking: Reported on 06/23/2017 06/08/17   Arrien, Jimmy Picket, MD  feeding supplement (BOOST / RESOURCE BREEZE) LIQD Take 1 Container by mouth 2 (two) times daily  between meals. Patient not taking: Reported on 06/23/2017 06/08/17 07/08/17  Arrien, Jimmy Picket, MD  feeding supplement, ENSURE ENLIVE, (ENSURE ENLIVE) LIQD Take 237 mLs by mouth 2 (two) times daily between meals. Patient not taking: Reported on 06/23/2017 06/08/17   Arrien, Jimmy Picket, MD    Physical Exam: Vitals:  06/23/17 1915 06/23/17 2015 06/23/17 2145 06/23/17 2215  BP: (!) 166/101 (!) 156/94 (!) 149/95 (!) 161/97  Pulse: 94 97 (!) 107 (!) 110  Resp: (!) 28 (!) 21 (!) 33 (!) 21  Temp:      TempSrc:      SpO2: 98% 100% 99% 99%    Constitutional: NAD, calm, comfortable, somewhat agitated with questions.  Vitals:   06/23/17 1915 06/23/17 2015 06/23/17 2145 06/23/17 2215  BP: (!) 166/101 (!) 156/94 (!) 149/95 (!) 161/97  Pulse: 94 97 (!) 107 (!) 110  Resp: (!) 28 (!) 21 (!) 33 (!) 21  Temp:      TempSrc:      SpO2: 98% 100% 99% 99%   Eyes: PERRL, mild conjunctival pallor, anicteric sclerae ENMT: Mucous membranes are moist. Posterior pharynx clear of any exudate or lesions.  Neck: normal, supple Respiratory: Decreased breath sounds at bases and crackles at bases. no wheezing. Normal respiratory effort. No accessory muscle use.  Cardiovascular: Regular rate and rhythm, no murmurs / rubs / gallops. Significant edema in LE and anasarca with pitting at sacrum and back.  1+ pedal pulses, thready pulses.   Abdomen: mild tenderness to palpation, +BS, + distention.  She has a wound vac in place centrally.   Musculoskeletal: no clubbing / cyanosis.  Decreased muscle tone.  Skin: no rashes, lesions, ulcers. + induration throughout, anasarca GU: No bleed present at rectum, she has been cleaned since initial exam.  Neurologic: CN 2-12 grossly intact. She is moving all extremities easily.  She is oriented to herself, knows her son and grandson are in the room and knows that she is in the hospital. She was very clear in her answers about wanting to continue treatment. However, she did  become somewhat confused when asked to perform some maneuvers.  Psychiatric: Some what distractible and agitated, particularly when her son would answer for her.    Labs on Admission: I have personally reviewed following labs and imaging studies  CBC:  Recent Labs Lab 06/23/17 2121  WBC 10.3  NEUTROABS 8.5*  HGB 10.5*  HCT 34.2*  MCV 86.8  PLT 818*   Basic Metabolic Panel:  Recent Labs Lab 06/23/17 2121  NA 140  K 3.3*  CL 103  CO2 29  GLUCOSE 167*  BUN 9  CREATININE 0.65  CALCIUM 7.3*   GFR: CrCl cannot be calculated (Unknown ideal weight.). Liver Function Tests:  Recent Labs Lab 06/23/17 2121  AST 18  ALT 11*  ALKPHOS 89  BILITOT 0.4  PROT 5.3*  ALBUMIN 1.3*   No results for input(s): LIPASE, AMYLASE in the last 168 hours. No results for input(s): AMMONIA in the last 168 hours. Coagulation Profile: No results for input(s): INR, PROTIME in the last 168 hours. Cardiac Enzymes: No results for input(s): CKTOTAL, CKMB, CKMBINDEX, TROPONINI in the last 168 hours. BNP (last 3 results) No results for input(s): PROBNP in the last 8760 hours. HbA1C: No results for input(s): HGBA1C in the last 72 hours. CBG: No results for input(s): GLUCAP in the last 168 hours. Lipid Profile: No results for input(s): CHOL, HDL, LDLCALC, TRIG, CHOLHDL, LDLDIRECT in the last 72 hours. Thyroid Function Tests: No results for input(s): TSH, T4TOTAL, FREET4, T3FREE, THYROIDAB in the last 72 hours. Anemia Panel: No results for input(s): VITAMINB12, FOLATE, FERRITIN, TIBC, IRON, RETICCTPCT in the last 72 hours. Urine analysis:    Component Value Date/Time   COLORURINE AMBER (A) 05/15/2017 0230   APPEARANCEUR CLEAR 05/15/2017 0230  LABSPEC 1.028 05/15/2017 0230   LABSPEC 1.010 08/17/2015 1542   PHURINE 5.0 05/15/2017 0230   GLUCOSEU NEGATIVE 05/15/2017 0230   GLUCOSEU 100 08/17/2015 1542   HGBUR NEGATIVE 05/15/2017 0230   BILIRUBINUR NEGATIVE 05/15/2017 0230   BILIRUBINUR  Negative 08/17/2015 1542   KETONESUR NEGATIVE 05/15/2017 0230   PROTEINUR 30 (A) 05/15/2017 0230   UROBILINOGEN 1.0 08/26/2015 0725   UROBILINOGEN 0.2 08/17/2015 1542   NITRITE NEGATIVE 05/15/2017 0230   LEUKOCYTESUR NEGATIVE 05/15/2017 0230   LEUKOCYTESUR Trace 08/17/2015 1542    Radiological Exams on Admission: Ct Abdomen Pelvis Wo Contrast  Result Date: 06/23/2017 CLINICAL DATA:  GI bleed. Melena. History of cervical cancer. History of appendiceal cancer. EXAM: CT ABDOMEN AND PELVIS WITHOUT CONTRAST TECHNIQUE: Multidetector CT imaging of the abdomen and pelvis was performed following the standard protocol without IV contrast. COMPARISON:  05/15/2017 FINDINGS: Lower chest: The heart is enlarged. Tiny pericardial effusion is similar to prior. There is dependent atelectasis in the lower lungs with moderate right and small left pleural effusions. Hepatobiliary: No focal abnormality in the liver on this study without intravenous contrast. Gallbladder is decompressed. The possible tiny stone in the gallbladder neck. No intrahepatic or extrahepatic biliary dilation. Pancreas: No focal mass lesion. No dilatation of the main duct. No intraparenchymal cyst. No peripancreatic edema. Spleen: No splenomegaly. No focal mass lesion. Adrenals/Urinary Tract: No adrenal nodule or mass. Left kidney is atrophic as before. There is new bilateral mild hydroureteronephrosis. The bladder is distended. Tiny air bubble in the bladder lumen may be related to recent instrumentation although bladder infection can cause this appearance. Stomach/Bowel: Stomach is mildly distended and fluid-filled. Duodenum unremarkable. No overt small bowel obstruction. The terminal ileum is normal. Suture line in the region of the mid transverse colon is compatible with the clinical history of enterocolostomy from the mid ileum to the mid transverse colon. Left colon unremarkable. There is edema a congestion of the small bowel mesentery with  small pockets of interloop mesenteric fluid. Vascular/Lymphatic: There is abdominal aortic atherosclerosis without aneurysm. There is no gastrohepatic or hepatoduodenal ligament lymphadenopathy. No intraperitoneal or retroperitoneal lymphadenopathy. No pelvic sidewall lymphadenopathy. Reproductive: Uterus appears enlarged. Endometrium is thickened in this patient with a history of cervical cancer. There is no adnexal mass. Other: Focal fluid collection is identified in the right lower quadrant. This is difficult to assess without oral or intravenous contrast material, but it appears to have a circumferential rim and does not appear to represent bowel. There is another fluid collection inferior to the liver tracking down the right peritoneal cavity and into the right para colic gutter. This collection measures 8.8 cm in craniocaudal length by 7.4 cm and AP dimension by 2.5 cm coronally. This also has a rim of increased attenuation raising concern for abscess. Musculoskeletal: Open midline wound identified with packing material. Diffuse body wall edema evident. Stable appearance sclerotic lesion right acetabulum. Bone windows reveal no worrisome lytic or sclerotic osseous lesions. IMPRESSION: 1. Patient is approximately 3 weeks out from diagnostic laparoscopy with abscess drainage, repair of small bowel perforation, and entero colostomy. On today's exam, there are 2 apparent fluid collections identified, but are difficult to assess given the lack of intravenous and oral contrast. One of these is just inferior to the liver in tracks down the right paracolic gutter. The second is in the right lower quadrant mesentery. Given the history of recent surgery both could represent intraperitoneal abscess. However, with the history of appendiceal carcinoma, pseudomyxoma peritonei is also a consideration.  2. No evidence for bowel obstruction. No bowel wall thickening is evident. There is fairly diffuse mesenteric edema and  vascular congestion with scattered collections of interloop mesenteric fluid. The degree of these changes is more advanced than would be expected 3 weeks out from surgery. 3. Dependent atelectasis in both lower lungs with moderate right and small left pleural effusions. 4. Tiny air bubble in the urinary bladder may be from recent instrumentation, but bladder infection can have this appearance. 5. Thickening of the endometrial stripe in this patient with history of cervical cancer. Electronically Signed   By: Misty Stanley M.D.   On: 06/23/2017 20:32     Assessment/Plan  Rectal Bleeding - She had 2 bloody bowel movements today.  I wonder if this is a secondary outcome of her recent surgery?  However, she is 3 weeks out so that is less likely - Mix of clots, dark blood and bright red blood - unclear if upper or lower source.  She has no EGD or Colonoscopy I could find in review of Care Everywhere - IV PPI daily.  - Trend CBC, q 6 hours - Will need consult GI for evaluation in the AM  - Type and Cross - Transfuse for Hgb < 7 - She is currently hemodynamically stable (BP in the 160s, HR in the 80s-100s - IVF with D51/2NS at 75cc/hr - NPO x sips with meds  Intra-abdominal fluid collections, abscess vs. Metastatic disease - Surgery has evaluated the patient, recommend Zosyn and IR drainage - Continue Zosyn - Blood cultures X 2 - She has an elevated HR, afebrile currently.  She does not currently meet SIRS criteria - IR consult in the AM for drainage of fluid collections - Continue wound vac after recent abdominal surgery.   Primary appendiceal adenocarcinoma with metastatic disease, stage IV - Fluid collections could also represent metastatic disease - IR drainage - I have placed Dr. [name] on treatment team, consider contacting oncology in the AM to inform them of her admission    HTN (hypertension) - BP high this evening.  Based on review of SNF notes she is only on metoprolol - Restart  metoprolol - Add another medication if needed  Leg Pain, burning - Restart low dose gabapentin, 300mg  QHS  DM2 - A1C - SSI q 4 hours - Monitor CBG while on fluid with glucose  HLD - Continue statin  GERD - IV PPI  Hypokalemia - Continue potassium supplementation  Hypocalcemia - Corrected for albumin, 9.5 - Monitor for worsening  Severe malnutrition with anasarca - Nutrition consult - Ensure BID    H/O Seizure - She reports not having one for years, she is currently not on any medications.  - Monitor closely, load with Keppra if seizure and neurology consult.    DVT prophylaxis: SCDs, due to rectal bleeding, consider lovenox if H/H stable Code Status: Full, confirmed with patient and family Family Communication: Son, [place], and grandson at bedside  Disposition Plan: Admit for antibiotics and GI Bleeding work up [employer] called: Surgery, Dr. Grandville Silos Admission status: Med-Surg, inpatient   Gilles Chiquito MD [facility] Pager [phone]  If 7PM-7AM, please contact night-coverage [email or web address] Password [facility]  06/23/2017, 10:49 PM

## 2017-06-23 NOTE — ED Notes (Signed)
Attempted IVx2 unsuccessful. Other nurse tried and unsuccessful.

## 2017-06-23 NOTE — Consult Note (Signed)
Reason for Consult:LGIB, intra-abdominal abscess Referring Physician: B. Yulianna Folse [name] is an 66 y.o. female.  HPI: Lori has a history of cervical cancer and appendiceal cancer with abdominal carcinomatosis. This caused a small bowel obstruction and on 05/30/17, Dr. [name] performed diagnostic laparoscopy with peritoneal nodule biopsy consistent with metastatic adenocarcinoma, exploratory laparotomy, drainage of intra-abdominal abscess, repair of small bowel perforation, and a palliative bypass from mid ileum to mid transverse colon at [facility]. She has been recuperating at a skilled nursing facility. Most recently, at [facility]. She initially was discharged to a different facility but changed due to concerns over her care. She was noted at the nursing facility to have a bloody bowel movement and was sent to the emergency department. She says she's been having some abdominal pain on and off but has been eating. The new facility is changing her abdominal wound  VAC on a regular basis. CT scan of the abdomen and pelvis was performed in the emergency department today and reveals 2 fluid collections which may represent abscesses. There is no evidence of obstruction. Hb on 7/6 was 9.6. Labs today are pending. Past Medical History:  Diagnosis Date  . Anemia 2017   3u blood 2017  . Arthritis    KNEES  . 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 This is raised a concern of malignancy. We'll plan to move forward with laparoscopic possible open appendectomy to rule out malignancy.   . Cervical cancer, FIGO stage IIB ([place]) 05/24/2015  . 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 Depression   . Endometrial  carcinoma [facility])    goes to [facility] in [place] for chemotherapy  . GERD (gastroesophageal reflux disease)   . Heart murmur   . History of blood transfusion   . 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 History of radiation therapy 07/28/15-08/26/15   cervical region 27.5 gray  . History of seizures    post-op craniotomy for aneurysm 1997-- controlled w/ medication since then no seizures  . Hyperlipidemia   . Hypertension   . PMB (postmenopausal bleeding)   . Pulmonary nodule 03/26/2017  . Seizures ([place])    hx of years ago   . Type 2 diabetes, diet controlled ([place])    diet controlled     Past Surgical History:  Procedure Laterality Date  . COLONOSCOPY    . CRANIOTOMY POSTERIOR FOSSA  1997   Repair aneurysm  x2  left side  . LAPAROSCOPIC APPENDECTOMY N/A 04/19/2016   Procedure: APPENDECTOMY LAPAROSCOPIC;  Surgeon: Hall Busing, MD;  Location: [facility];  Service: General;  Laterality: N/A;  . LAPAROSCOPY N/A 05/30/2017   Procedure: LAPAROSCOPY DIAGNOSTIC WITH PERITONEAL BIOPSY;  Surgeon: Jackolyn Confer, MD;  Location: [facility];  Service: General;  Laterality: N/A;  . LAPAROTOMY  05/30/2017   Procedure: EXPLORATORY LAPAROTOMY REPAIR WITH DRAINAGE OF INTRA ABDOMINAL ABSCESS OF SMALL BOWEL PERFORATION WITH ENTEROCOLOSTOMY;  Surgeon: Jackolyn Confer, MD;  Location: [facility];  Service: General;;  . PORT-A-CATH PLACEMENT  06/  2016  . TANDEM RING INSERTION N/A 07/28/2015   Procedure: TANDEM RING PLACEMENT;  Surgeon: Gery Pray, MD;  Location: [place];  Service: Urology;  Laterality: N/A;  . TANDEM RING INSERTION N/A 08/05/2015   Procedure: TANDEM RING PLACEMENT ;  Surgeon: Gery Pray, MD;  Location: [facility];  Service: Urology;  Laterality: N/A;  . TANDEM RING INSERTION N/A 08/11/2015   Procedure: TANDEM RING PLACEMENT ;  Surgeon: Gery Pray, MD;  Location: [facility];  Service: Urology;  Laterality: N/A;  . TANDEM RING INSERTION N/A 08/17/2015   Procedure: TANDEM RING PLACEMENT ;  Surgeon: Gery Pray, MD;  Location: [facility];  Service: Urology;  Laterality: N/A;  . TANDEM RING INSERTION N/A 08/26/2015   Procedure: TANDEM RING PLACEMENT ;  Surgeon: Gery Pray, MD;  Location: [facility];  Service: Urology;  Laterality: N/A;    Family History  Problem Relation Age of Onset  . Throat cancer Father   . Cervical cancer Sister     Social History:  reports that she quit smoking about 21 years ago. Her smoking use included Cigarettes. She has a 1.25 pack-year smoking history. She has never used smokeless tobacco. She reports that she does not drink alcohol or use drugs.  Allergies:  Allergies  Allergen Reactions  . Contrast Media [Iodinated Diagnostic Agents] Itching and Swelling  . Dilantin [Phenytoin Sodium Extended] Itching, Swelling and Other (See Comments)    Whole body  . Latex Hives and Itching  . Adhesive [Tape] Rash    Medications: I have reviewed the patient's current medications.  Results for orders placed or performed during the hospital encounter of 06/23/17 (from the past 48 hour(s))  POC occult blood, ED Provider will collect     Status: Abnormal   Collection Time: 06/23/17  7:17 PM  Result Value Ref Range   Fecal Occult Bld POSITIVE (A) NEGATIVE    Ct Abdomen Pelvis Wo Contrast  Result Date: 06/23/2017 CLINICAL DATA:  GI bleed. Melena. History of cervical cancer. History of appendiceal cancer. EXAM: CT ABDOMEN AND PELVIS WITHOUT CONTRAST TECHNIQUE: Multidetector CT imaging of the abdomen and pelvis was performed following the standard protocol without IV contrast. COMPARISON:  05/15/2017 FINDINGS: Lower chest: The heart is enlarged. Tiny pericardial effusion is similar to prior. There is dependent atelectasis in the lower lungs with moderate right and small left pleural effusions.  Hepatobiliary: No focal abnormality in the liver on this study without intravenous contrast. Gallbladder is decompressed. The possible tiny stone in the gallbladder neck. No intrahepatic or extrahepatic biliary dilation. Pancreas: No focal mass lesion. No dilatation of the main duct. No intraparenchymal cyst. No peripancreatic edema. Spleen: No splenomegaly. No focal mass lesion. Adrenals/Urinary Tract: No adrenal nodule or mass. Left kidney is atrophic as before. There is new bilateral mild hydroureteronephrosis. The bladder is distended. Tiny air bubble in the bladder lumen may be related to recent instrumentation although bladder infection can cause this appearance. Stomach/Bowel: Stomach is mildly distended and fluid-filled. Duodenum unremarkable. No overt small bowel obstruction. The terminal ileum is normal. Suture line in the region of the mid transverse colon is compatible with the clinical history of enterocolostomy from the mid ileum to the mid transverse colon. Left colon unremarkable. There is edema a congestion of the small bowel mesentery with small pockets of interloop mesenteric fluid. Vascular/Lymphatic: There is abdominal aortic atherosclerosis without aneurysm. There is no gastrohepatic or hepatoduodenal ligament lymphadenopathy. No intraperitoneal or retroperitoneal lymphadenopathy. No pelvic sidewall lymphadenopathy. Reproductive: Uterus appears enlarged. Endometrium is thickened in this patient with a history of cervical  cancer. There is no adnexal mass. Other: Focal fluid collection is identified in the right lower quadrant. This is difficult to assess without oral or intravenous contrast material, but it appears to have a circumferential rim and does not appear to represent bowel. There is another fluid collection inferior to the liver tracking down the right peritoneal cavity and into the right para colic gutter. This collection measures 8.8 cm in craniocaudal length by 7.4 cm and AP  dimension by 2.5 cm coronally. This also has a rim of increased attenuation raising concern for abscess. Musculoskeletal: Open midline wound identified with packing material. Diffuse body wall edema evident. Stable appearance sclerotic lesion right acetabulum. Bone windows reveal no worrisome lytic or sclerotic osseous lesions. IMPRESSION: 1. Patient is approximately 3 weeks out from diagnostic laparoscopy with abscess drainage, repair of small bowel perforation, and entero colostomy. On today's exam, there are 2 apparent fluid collections identified, but are difficult to assess given the lack of intravenous and oral contrast. One of these is just inferior to the liver in tracks down the right paracolic gutter. The second is in the right lower quadrant mesentery. Given the history of recent surgery both could represent intraperitoneal abscess. However, with the history of appendiceal carcinoma, pseudomyxoma peritonei is also a consideration. 2. No evidence for bowel obstruction. No bowel wall thickening is evident. There is fairly diffuse mesenteric edema and vascular congestion with scattered collections of interloop mesenteric fluid. The degree of these changes is more advanced than would be expected 3 weeks out from surgery. 3. Dependent atelectasis in both lower lungs with moderate right and small left pleural effusions. 4. Tiny air bubble in the urinary bladder may be from recent instrumentation, but bladder infection can have this appearance. 5. Thickening of the endometrial stripe in this patient with history of cervical cancer. Electronically Signed   By: Misty Stanley M.D.   On: 06/23/2017 20:32    Review of Systems  Constitutional: Negative for fever.  HENT: Negative.   Eyes: Negative.   Respiratory: Negative for cough and shortness of breath.   Cardiovascular: Negative for chest pain.  Gastrointestinal: Positive for abdominal pain and blood in stool. Negative for nausea and vomiting.   Genitourinary: Negative.   Musculoskeletal: Negative.   Skin: Negative.   Neurological: Negative.   Endo/Heme/Allergies: Negative.   Psychiatric/Behavioral: Negative.    Blood pressure (!) 156/94, pulse 97, temperature 99.9 F (37.7 C), temperature source Oral, resp. rate (!) 21, SpO2 100 %. Physical Exam  Constitutional: She is oriented to person, place, and time. She appears well-developed and well-nourished.  HENT:  Head: Normocephalic.  Right Ear: External ear normal.  Left Ear: External ear normal.  Mouth/Throat: Oropharynx is clear and moist.  Eyes: Pupils are equal, round, and reactive to light.  Neck: Neck supple.  Cardiovascular: Normal rate and regular rhythm.   Respiratory: Effort normal and breath sounds normal.  GI: Soft. She exhibits no distension. There is tenderness. There is no rebound and no guarding.  VAC on midline wound, very mild tenderness on the right side of the abdomen without focal mass, no peritonitis  Neurological: She is alert and oriented to person, place, and time.  Skin: Skin is warm.  Psychiatric: She has a normal mood and affect.    Assessment/Plan: History of cervical cancer and appendiceal cancer with peritoneal carcinomatosis which recently caused bowel obstruction and perforation. Status post peritoneal biopsy, small bowel repair, and palliative bypass. Now with bloody stool and possible intra-abdominal abscesses. Agree with  medical admission. She may need GI evaluation. Will ask IR tomorrow to see if these abscesses are drainable. IV Zosyn. We will follow.  Abdel Effinger E 06/23/2017, 9:27 PM

## 2017-06-24 ENCOUNTER — Encounter (HOSPITAL_COMMUNITY): Payer: Self-pay

## 2017-06-24 DIAGNOSIS — K922 Gastrointestinal hemorrhage, unspecified: Principal | ICD-10-CM

## 2017-06-24 DIAGNOSIS — C786 Secondary malignant neoplasm of retroperitoneum and peritoneum: Secondary | ICD-10-CM

## 2017-06-24 DIAGNOSIS — Z7189 Other specified counseling: Secondary | ICD-10-CM

## 2017-06-24 DIAGNOSIS — Z515 Encounter for palliative care: Secondary | ICD-10-CM

## 2017-06-24 DIAGNOSIS — C801 Malignant (primary) neoplasm, unspecified: Secondary | ICD-10-CM

## 2017-06-24 LAB — BASIC METABOLIC PANEL
ANION GAP: 8 (ref 5–15)
BUN: 14 mg/dL (ref 6–20)
CHLORIDE: 106 mmol/L (ref 101–111)
CO2: 28 mmol/L (ref 22–32)
Calcium: 7.1 mg/dL — ABNORMAL LOW (ref 8.9–10.3)
Creatinine, Ser: 0.77 mg/dL (ref 0.44–1.00)
GFR calc Af Amer: >60 mL/min (ref >60)
Glucose, Bld: 103 mg/dL — ABNORMAL HIGH (ref 65–99)
POTASSIUM: 3.5 mmol/L (ref 3.5–5.1)
SODIUM: 142 mmol/L (ref 135–145)

## 2017-06-24 LAB — IRON AND TIBC
IRON: 13 ug/dL — AB (ref 28–170)
SATURATION RATIOS: 12 % (ref 10.4–31.8)
TIBC: 106 ug/dL — AB (ref 250–450)
UIBC: 93 ug/dL

## 2017-06-24 LAB — GLUCOSE, CAPILLARY
GLUCOSE-CAPILLARY: 154 mg/dL — AB (ref 65–99)
GLUCOSE-CAPILLARY: 161 mg/dL — AB (ref 65–99)
GLUCOSE-CAPILLARY: 84 mg/dL (ref 65–99)
Glucose-Capillary: 112 mg/dL — ABNORMAL HIGH (ref 65–99)
Glucose-Capillary: 99 mg/dL (ref 65–99)
Glucose-Capillary: 75 mg/dL (ref 65–99)

## 2017-06-24 LAB — CBC
HCT: 17.2 % — ABNORMAL LOW (ref 36.0–46.0)
HCT: 21.1 % — ABNORMAL LOW (ref 36.0–46.0)
HEMOGLOBIN: 5.6 g/dL — AB (ref 12.0–15.0)
Hemoglobin: 6.8 g/dL — CL (ref 12.0–15.0)
MCH: 27 pg (ref 26.0–34.0)
MCH: 27.1 pg (ref 26.0–34.0)
MCHC: 31.4 g/dL (ref 30.0–36.0)
MCHC: 32.2 g/dL (ref 30.0–36.0)
MCV: 84.1 fL (ref 78.0–100.0)
MCV: 86 fL (ref 78.0–100.0)
PLATELETS: 373 10*3/uL (ref 150–400)
PLATELETS: 529 10*3/uL — AB (ref 150–400)
RBC: 2 MIL/uL — AB (ref 3.87–5.11)
RBC: 2.51 MIL/uL — AB (ref 3.87–5.11)
RDW: 16.7 % — ABNORMAL HIGH (ref 11.5–15.5)
RDW: 15.8 % — ABNORMAL HIGH (ref 11.5–15.5)
WBC: 13.4 10*3/uL — ABNORMAL HIGH (ref 4.0–10.5)
WBC: 16.6 10*3/uL — AB (ref 4.0–10.5)

## 2017-06-24 LAB — PREPARE RBC (CROSSMATCH)

## 2017-06-24 LAB — PREALBUMIN: Prealbumin: 6.1 mg/dL — ABNORMAL LOW (ref 18–38)

## 2017-06-24 LAB — ABO/RH: ABO/RH(D): O POS

## 2017-06-24 LAB — FERRITIN: FERRITIN: 680 ng/mL — AB (ref 11–307)

## 2017-06-24 MED ORDER — DIPHENHYDRAMINE HCL 50 MG/ML IJ SOLN
12.5000 mg | Freq: Once | INTRAMUSCULAR | Status: AC
  Administered 2017-06-24: 12.5 mg via INTRAVENOUS
  Filled 2017-06-24: qty 1

## 2017-06-24 MED ORDER — PANTOPRAZOLE SODIUM 40 MG IV SOLR
40.0000 mg | Freq: Once | INTRAVENOUS | Status: AC
Start: 2017-06-24 — End: 2017-06-24
  Administered 2017-06-24: 40 mg via INTRAVENOUS
  Filled 2017-06-24: qty 40

## 2017-06-24 MED ORDER — FOLIC ACID 1 MG PO TABS
1.0000 mg | ORAL_TABLET | Freq: Every day | ORAL | Status: DC
  Administered 2017-06-24: 1 mg via ORAL
  Filled 2017-06-24: qty 1

## 2017-06-24 MED ORDER — SODIUM CHLORIDE 0.9 % IV SOLN
Freq: Once | INTRAVENOUS | Status: AC
  Administered 2017-06-24: 20:00:00 via INTRAVENOUS

## 2017-06-24 MED ORDER — PANTOPRAZOLE SODIUM 40 MG IV SOLR
8.0000 mg/h | INTRAVENOUS | Status: AC
  Administered 2017-06-24 – 2017-06-26 (×6): 8 mg/h via INTRAVENOUS
  Filled 2017-06-24 (×11): qty 80

## 2017-06-24 MED ORDER — PANTOPRAZOLE SODIUM 40 MG IV SOLR: 40.0000 mg | Freq: Two times a day (BID) | INTRAVENOUS | Status: DC

## 2017-06-24 MED ORDER — MORPHINE SULFATE (PF) 4 MG/ML IV SOLN
1.0000 mg | INTRAVENOUS | Status: DC | PRN
  Administered 2017-06-24 – 2017-06-30 (×5): 1 mg via INTRAVENOUS
  Filled 2017-06-24 (×5): qty 1

## 2017-06-24 MED ORDER — SODIUM CHLORIDE 0.9% FLUSH
10.0000 mL | INTRAVENOUS | Status: DC | PRN
  Administered 2017-06-25 – 2017-06-30 (×4): 10 mL
  Administered 2017-07-01 – 2017-07-11 (×2): 20 mL
  Administered 2017-07-12 – 2017-08-09 (×10): 10 mL
  Filled 2017-06-24 (×16): qty 40

## 2017-06-24 MED ORDER — SODIUM CHLORIDE 0.9 % IV SOLN: Freq: Once | INTRAVENOUS | Status: DC

## 2017-06-24 MED ORDER — SODIUM CHLORIDE 0.9 % IV SOLN
Freq: Once | INTRAVENOUS | Status: AC
  Administered 2017-06-24: 03:00:00 via INTRAVENOUS

## 2017-06-24 MED ORDER — FUROSEMIDE 10 MG/ML IJ SOLN
20.0000 mg | Freq: Once | INTRAMUSCULAR | Status: AC
  Administered 2017-06-24: 20 mg via INTRAVENOUS
  Filled 2017-06-24: qty 2

## 2017-06-24 NOTE — Progress Notes (Addendum)
PROGRESS NOTE    Lori [name]  BJY:[id number] DOB: 1951-07-23 DOA: 06/23/2017 PCP: [name], MD  Outpatient Specialists:   Dr. [name] Dr.   Chauncey Reading- [phone].   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  Represented 05/29/17 SBO and had   Exploratory laparoscopy 05/30/2017 drainage of abscesses, repair small bowel perforation, repair small bowel enterotomy,  Enterocolostomy--course complicated by septic shock was on vasopressin as and on ICU service for 20 days until 7/22  Assessment & Plan:   Active Problems:   Primary appendiceal adenocarcinoma (H[facility])   HTN (hypertension)   Seizure ([place])   Peritoneal carcinomatosis ([place])   Intra-abdominal abscess ([place])  Primary appendiceal adenocarcinoma metastatic disease stage IV Rectal bleeding  Source unclear--discussed with surgeon Dr. Marisue Ivan not be a good candidate for any type of scope and is best off with supportive care at this juncture at least for now--usually be scoped 6 weeks out from surgery  Hold GI consult at this time  Transfuse until hemoglobin about 8 still having small amount of rectal bleeding  increase check of hemoglobin to q8--transfusion threshold below 8  Await Iron and Tsat and CEA ordered by Dr. Marin Olp  Hold non essential meds-statin, calcium carb folate tylenol  Continue Protonix Gtt for at least 73 hrs--high risk re-bleeding  NPO   Dm ty II  Sugars 99-122  No coverage for now  At home on SSI coverage only, no long acting  Keep d5/ns 75 cc/h  IC aneurysm in the past  Hold ASA as bleeding  Stg II B Ca  Quiescent at this time  Htn  Continue metoprolol 25 bid  Monitor tredns    Long discussion  with patient.  Mentions her strong faith in being healed by god--I tried to explain god gives signs and that signs are not to be ignored. She has an ABYSMAL prognosis and is not coming to terms with this palliative care and oncology input much appreciated--I don't think she is ready to change code status from Full code I did chat with her son who isnt sure what to do--he figures "it would come to this" We will follow holistically in am FULL CODE   Procedures:   None planned as yet  Antimicrobials:    zosyn    Subjective:  Feels fair-weak Wants to eat Some bleeding today No cp No sob   Objective: Vitals:   06/24/17 0938 06/24/17 1045 06/24/17 1200 06/24/17 1405  BP: 135/76  139/79 (!) 143/79  Pulse: 90  79 84  Resp: 16  16 17   Temp: 98.7 F (37.1 C)  99.1 F (37.3 C) 97.9 F (36.6 C)  TempSrc: Oral  Oral Oral  SpO2: 98% 98% 98% 98%  Weight:      Height:        Intake/Output Summary (Last 24 hours) at 06/24/17 0916 Last data filed at 06/24/17 0550  Gross per 24 hour  Intake              301 ml  Output                0 ml  Net              301 ml   Filed Weights   06/23/17 2347  Weight: 100 kg (220 lb 6.4 oz)  Examination:  Obese pleasant slight confusion No pallor no ict Mild jvd Tachy Wound vac on abd grd1 le edema Chest clear    Data Reviewed: I have personally reviewed following labs and imaging studies  CBC:  Recent Labs Lab 06/23/17 2121 06/24/17 0045  WBC 10.3 16.6*  NEUTROABS 8.5*  --   HGB 10.5* 5.6*  HCT 34.2* 17.2*  MCV 86.8 86.0  PLT 434* 163*   Basic Metabolic Panel:  Recent Labs Lab 06/23/17 2121  NA 140  K 3.3*  CL 103  CO2 29  GLUCOSE 167*  BUN 9  CREATININE 0.65  CALCIUM 7.3*   GFR: Estimated Creatinine Clearance: 87 mL/min (by C-G formula based on SCr of 0.65 mg/dL). Liver Function Tests:  Recent Labs Lab 06/23/17 2121  AST 18  ALT 11*  ALKPHOS 89  BILITOT 0.4  PROT 5.3*  ALBUMIN 1.3*   No  results for input(s): LIPASE, AMYLASE in the last 168 hours. No results for input(s): AMMONIA in the last 168 hours. Coagulation Profile: No results for input(s): INR, PROTIME in the last 168 hours. Cardiac Enzymes: No results for input(s): CKTOTAL, CKMB, CKMBINDEX, TROPONINI in the last 168 hours. BNP (last 3 results) No results for input(s): PROBNP in the last 8760 hours. HbA1C: No results for input(s): HGBA1C in the last 72 hours. CBG:  Recent Labs Lab 06/24/17 0056 06/24/17 0358 06/24/17 0753  GLUCAP 154* 161* 112*   Lipid Profile: No results for input(s): CHOL, HDL, LDLCALC, TRIG, CHOLHDL, LDLDIRECT in the last 72 hours. Thyroid Function Tests: No results for input(s): TSH, T4TOTAL, FREET4, T3FREE, THYROIDAB in the last 72 hours. Anemia Panel: No results for input(s): VITAMINB12, FOLATE, FERRITIN, TIBC, IRON, RETICCTPCT in the last 72 hours. Urine analysis:    Component Value Date/Time   COLORURINE AMBER (A) 05/15/2017 0230   APPEARANCEUR CLEAR 05/15/2017 0230   LABSPEC 1.028 05/15/2017 0230   LABSPEC 1.010 08/17/2015 1542   PHURINE 5.0 05/15/2017 0230   GLUCOSEU NEGATIVE 05/15/2017 0230   GLUCOSEU 100 08/17/2015 1542   HGBUR NEGATIVE 05/15/2017 0230   BILIRUBINUR NEGATIVE 05/15/2017 0230   BILIRUBINUR Negative 08/17/2015 1542   KETONESUR NEGATIVE 05/15/2017 0230   PROTEINUR 30 (A) 05/15/2017 0230   UROBILINOGEN 1.0 08/26/2015 0725   UROBILINOGEN 0.2 08/17/2015 1542   NITRITE NEGATIVE 05/15/2017 0230   LEUKOCYTESUR NEGATIVE 05/15/2017 0230   LEUKOCYTESUR Trace 08/17/2015 1542    )No results found for this or any previous visit (from the past 240 hour(s)).       Radiology Studies: Ct Abdomen Pelvis Wo Contrast  Result Date: 06/23/2017 CLINICAL DATA:  GI bleed. Melena. History of cervical cancer. History of appendiceal cancer. EXAM: CT ABDOMEN AND PELVIS WITHOUT CONTRAST TECHNIQUE: Multidetector CT imaging of the abdomen and pelvis was performed following  the standard protocol without IV contrast. COMPARISON:  05/15/2017 FINDINGS: Lower chest: The heart is enlarged. Tiny pericardial effusion is similar to prior. There is dependent atelectasis in the lower lungs with moderate right and small left pleural effusions. Hepatobiliary: No focal abnormality in the liver on this study without intravenous contrast. Gallbladder is decompressed. The possible tiny stone in the gallbladder neck. No intrahepatic or extrahepatic biliary dilation. Pancreas: No focal mass lesion. No dilatation of the main duct. No intraparenchymal cyst. No peripancreatic edema. Spleen: No splenomegaly. No focal mass lesion. Adrenals/Urinary Tract: No adrenal nodule or mass. Left kidney is atrophic as before. There is new bilateral mild hydroureteronephrosis. The bladder is distended. Tiny air bubble in the bladder lumen  may be related to recent instrumentation although bladder infection can cause this appearance. Stomach/Bowel: Stomach is mildly distended and fluid-filled. Duodenum unremarkable. No overt small bowel obstruction. The terminal ileum is normal. Suture line in the region of the mid transverse colon is compatible with the clinical history of enterocolostomy from the mid ileum to the mid transverse colon. Left colon unremarkable. There is edema a congestion of the small bowel mesentery with small pockets of interloop mesenteric fluid. Vascular/Lymphatic: There is abdominal aortic atherosclerosis without aneurysm. There is no gastrohepatic or hepatoduodenal ligament lymphadenopathy. No intraperitoneal or retroperitoneal lymphadenopathy. No pelvic sidewall lymphadenopathy. Reproductive: Uterus appears enlarged. Endometrium is thickened in this patient with a history of cervical cancer. There is no adnexal mass. Other: Focal fluid collection is identified in the right lower quadrant. This is difficult to assess without oral or intravenous contrast material, but it appears to have a  circumferential rim and does not appear to represent bowel. There is another fluid collection inferior to the liver tracking down the right peritoneal cavity and into the right para colic gutter. This collection measures 8.8 cm in craniocaudal length by 7.4 cm and AP dimension by 2.5 cm coronally. This also has a rim of increased attenuation raising concern for abscess. Musculoskeletal: Open midline wound identified with packing material. Diffuse body wall edema evident. Stable appearance sclerotic lesion right acetabulum. Bone windows reveal no worrisome lytic or sclerotic osseous lesions. IMPRESSION: 1. Patient is approximately 3 weeks out from diagnostic laparoscopy with abscess drainage, repair of small bowel perforation, and entero colostomy. On today's exam, there are 2 apparent fluid collections identified, but are difficult to assess given the lack of intravenous and oral contrast. One of these is just inferior to the liver in tracks down the right paracolic gutter. The second is in the right lower quadrant mesentery. Given the history of recent surgery both could represent intraperitoneal abscess. However, with the history of appendiceal carcinoma, pseudomyxoma peritonei is also a consideration. 2. No evidence for bowel obstruction. No bowel wall thickening is evident. There is fairly diffuse mesenteric edema and vascular congestion with scattered collections of interloop mesenteric fluid. The degree of these changes is more advanced than would be expected 3 weeks out from surgery. 3. Dependent atelectasis in both lower lungs with moderate right and small left pleural effusions. 4. Tiny air bubble in the urinary bladder may be from recent instrumentation, but bladder infection can have this appearance. 5. Thickening of the endometrial stripe in this patient with history of cervical cancer. Electronically Signed   By: Misty Stanley M.D.   On: 06/23/2017 20:32     Scheduled Meds: . feeding supplement  (ENSURE ENLIVE)  237 mL Oral BID BM  . gabapentin  300 mg Oral QHS  . insulin aspart  0-15 Units Subcutaneous Q4H  . metoprolol tartrate  25 mg Oral BID  . multivitamin with minerals  1 tablet Oral QHS  . potassium chloride SA  20 mEq Oral Daily  . sodium chloride flush  3 mL Intravenous Q12H   Continuous Infusions: . dextrose 5 % and 0.45% NaCl 75 mL/hr at 06/24/17 0025  . pantoprozole (PROTONIX) infusion 8 mg/hr (06/24/17 0657)  . piperacillin-tazobactam (ZOSYN)  IV       LOS: 1 day    Time spent: [place], MD [facility]   If 7PM-7AM, please contact night-coverage [email or web address] Password [facility] 06/24/2017, 9:16 AM

## 2017-06-24 NOTE — Progress Notes (Signed)
No charge note:   Left message for Gasper Lloyd and Carmyn Hamm requesting return calls to arrange family [place] meeting.  Please call Palliative Medicine team phone with any questions [phone]. For individual providers please see AMION.  Please call Palliative Medicine team phone with any questions [phone]. For individual providers please see AMION.

## 2017-06-24 NOTE — Progress Notes (Signed)
CRITICAL VALUE ALERT  Critical Value:  Hgb 5.6  Date & Time Notied:  06/24/17 0225  Provider Notified: Opyd  Orders Received/Actions taken: Yes ordered to transfuse 2 units of pRBCs

## 2017-06-24 NOTE — Progress Notes (Signed)
Sent message to Dr., critical hemoglobin level of 6.8.

## 2017-06-24 NOTE — Consult Note (Addendum)
Referral MD  Reason for Referral: Metastatic appendiceal mucinous adenocarcinoma; GI bleeding; history of bowel obstruction with perforation, status post laparotomy   Chief Complaint  Patient presents with  . Blood In Stools  : My blood is low.  HPI: Lori [name] is a very nice 66 year old African-American female. She lives in [place]. She had a sore toward surgery about a month ago. 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 She probably was diagnosed with this a year ago after having an appendectomy.  With her surgery, she was found have bowel obstruction. There was perforation. Surprisingly, she did not need a colostomy.  She has been living in skilled nursing.  She has seen the oncologist in [place]. It sounds like there was some indication that there might be a chemotherapy plan. She just was in too poor of shape to be able to take any treatment.  She then developed GI bleeding. She had bleeding parietum. Apparently, the patient does not state that there was any bleeding.  She was admitted with a hemoglobin of 5.6.  She had a CT scan done. She had some fluid collections. It is unclear whether these are abscesses or collections of fluid consistent with pseudomyxoma peritonei.  Her labs show an albumin of only 1.3. Her creatinine is 0.65.  She is not hurting. She is very talkative. She apparently had been eating. She is hungry. Her family is going to bring her food. It looks like she is nothing by mouth.  We are asked to see her to address any type of oncologic issues and treatment considerations.  She is not walking. She says that she is too weak to walk.  Overall, I would say her performance status is ECOG 3 at best.   Past Medical History:  Diagnosis Date  . Anemia 2017   3u blood 2017  . Arthritis    KNEES  . 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 This is raised a concern of malignancy. We'll plan to move forward with laparoscopic possible open appendectomy to rule out malignancy.   . Cervical cancer, FIGO stage IIB ([place]) 05/24/2015  . Cervical carcinoma [facility]) oncologist--  dr Sondra Come for radiation/   [facility] for chemotherapy   endocervical adenocarcinoma w/  Nodal METS--  FIGO Stage IIB  . Depression   . Endometrial carcinoma [facility])    goes to [facility] in [place] for chemotherapy  . GERD (gastroesophageal reflux disease)   . Heart murmur   . History of blood transfusion   . 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 History of radiation therapy 07/28/15-08/26/15   cervical region 27.5 gray  . History of seizures    post-op craniotomy for aneurysm 1997-- controlled w/ medication since then no seizures  . Hyperlipidemia   . Hypertension   . PMB (postmenopausal bleeding)   . Pulmonary nodule 03/26/2017  . Seizures ([place])    hx of years ago   . Type 2 diabetes, diet controlled ([place])    diet controlled   :  Past Surgical History:  Procedure Laterality Date  . COLONOSCOPY    . CRANIOTOMY POSTERIOR FOSSA  1997   Repair aneurysm  x2  left side  . LAPAROSCOPIC APPENDECTOMY N/A 04/19/2016   Procedure: APPENDECTOMY LAPAROSCOPIC;  Surgeon:  Hall Busing, MD;  Location: [facility];  Service: General;  Laterality: N/A;  . LAPAROSCOPY N/A 05/30/2017   Procedure: LAPAROSCOPY DIAGNOSTIC WITH PERITONEAL BIOPSY;  Surgeon: Jackolyn Confer, MD;  Location: [facility];  Service: General;  Laterality: N/A;  . LAPAROTOMY  05/30/2017   Procedure: EXPLORATORY LAPAROTOMY REPAIR WITH DRAINAGE OF INTRA ABDOMINAL ABSCESS OF SMALL BOWEL PERFORATION WITH ENTEROCOLOSTOMY;  Surgeon: Jackolyn Confer, MD;  Location: [facility];  Service: General;;  . PORT-A-CATH PLACEMENT  06/  2016  . TANDEM RING INSERTION  N/A 07/28/2015   Procedure: TANDEM RING PLACEMENT;  Surgeon: Gery Pray, MD;  Location: [facility];  Service: Urology;  Laterality: N/A;  . TANDEM RING INSERTION N/A 08/05/2015   Procedure: TANDEM RING PLACEMENT ;  Surgeon: Gery Pray, MD;  Location: [facility];  Service: Urology;  Laterality: N/A;  . TANDEM RING INSERTION N/A 08/11/2015   Procedure: TANDEM RING PLACEMENT ;  Surgeon: Gery Pray, MD;  Location: [facility];  Service: Urology;  Laterality: N/A;  . TANDEM RING INSERTION N/A 08/17/2015   Procedure: TANDEM RING PLACEMENT ;  Surgeon: Gery Pray, MD;  Location: [facility];  Service: Urology;  Laterality: N/A;  . TANDEM RING INSERTION N/A 08/26/2015   Procedure: TANDEM RING PLACEMENT ;  Surgeon: Gery Pray, MD;  Location: [facility];  Service: Urology;  Laterality: N/A;  :   Current Facility-Administered Medications:  .  acetaminophen (TYLENOL) tablet 650 mg, 650 mg, Oral, Q6H PRN **OR** acetaminophen (TYLENOL) suppository 650 mg, 650 mg, Rectal, Q6H PRN, Sid Falcon, MD .  alum & mag hydroxide-simeth (MAALOX/MYLANTA) 200-200-20 MG/5ML suspension 30 mL, 30 mL, Oral, Q6H PRN, Gilles Chiquito B, MD .  calcium carbonate (TUMS - dosed in mg elemental calcium) chewable tablet 200 mg of elemental calcium, 1 tablet, Oral, BID, Gilles Chiquito B, MD, 200 mg of elemental calcium at 06/24/17 1041 .  dextrose 5 %-0.45 % sodium chloride infusion, , Intravenous, Continuous, Gilles Chiquito B, MD, Last Rate: 75 mL/hr at 06/24/17 0025 .  feeding supplement (ENSURE ENLIVE) (ENSURE ENLIVE) liquid 237 mL, 237 mL, Oral, BID BM, Gilles Chiquito B, MD .  folic acid (FOLVITE) tablet 1 mg, 1 mg, Oral, Daily, Gerrianne Aydelott, Rudell Cobb, MD .  gabapentin (NEURONTIN) capsule 300 mg, 300 mg, Oral, QHS, Gilles Chiquito B, MD, 300 mg at 06/24/17 0026 .  insulin aspart (novoLOG) injection 0-15 Units, 0-15 Units, Subcutaneous, Q4H, Sid Falcon, MD,  3 Units at 06/24/17 [phone] .  metoprolol tartrate (LOPRESSOR) tablet 25 mg, 25 mg, Oral, BID, Gilles Chiquito B, MD, 25 mg at 06/24/17 1041 .  multivitamin with minerals tablet 1 tablet, 1 tablet, Oral, QHS, Sid Falcon, MD, 1 tablet at 06/24/17 0026 .  ondansetron (ZOFRAN) tablet 4 mg, 4 mg, Oral, Q6H PRN **OR** ondansetron (ZOFRAN) injection 4 mg, 4 mg, Intravenous, Q6H PRN, Gilles Chiquito B, MD .  pantoprazole (PROTONIX) 80 mg in sodium chloride 0.9 % 250 mL (0.32 mg/mL) infusion, 8 mg/hr, Intravenous, Continuous, Opyd, Ilene Qua, MD, Last Rate: 25 mL/hr at 06/24/17 0657, 8 mg/hr at 06/24/17 0657 .  [START ON 06/27/2017] pantoprazole (PROTONIX) injection 40 mg, 40 mg, Intravenous, Q12H, Opyd, Timothy S, MD .  piperacillin-tazobactam (ZOSYN) IVPB 3.375 g, 3.375 g, Intravenous, Q8H, Gilles Chiquito B, MD, Last Rate: 12.5 mL/hr at 06/24/17 1216, 3.375 g at 06/24/17 1216 .  potassium chloride SA (K-DUR,KLOR-CON) CR tablet 20 mEq, 20 mEq, Oral, Daily, Sid Falcon, MD, 20  mEq at 06/24/17 1042 .  pravastatin (PRAVACHOL) tablet 20 mg, 20 mg, Oral, QHS, Gilles Chiquito B, MD, 20 mg at 06/24/17 0026 .  sodium chloride flush (NS) 0.9 % injection 10-40 mL, 10-40 mL, Intracatheter, PRN, Verlon Au, Jai-Gurmukh, MD .  sodium chloride flush (NS) 0.9 % injection 3 mL, 3 mL, Intravenous, Q12H, Gilles Chiquito B, MD, 3 mL at 06/24/17 1210  Facility-Administered Medications Ordered in Other Encounters:  .  sodium chloride flush (NS) 0.9 % injection 10 mL, 10 mL, Intravenous, PRN, Kefalas, Thomas S, PA-C, 10 mL at 03/26/17 1050:  . calcium carbonate  1 tablet Oral BID  . feeding supplement (ENSURE ENLIVE)  237 mL Oral BID BM  . folic acid  1 mg Oral Daily  . gabapentin  300 mg Oral QHS  . insulin aspart  0-15 Units Subcutaneous Q4H  . metoprolol tartrate  25 mg Oral BID  . multivitamin with minerals  1 tablet Oral QHS  . [START ON 06/27/2017] pantoprazole  40 mg Intravenous Q12H  . potassium chloride SA  20 mEq Oral  Daily  . pravastatin  20 mg Oral QHS  . sodium chloride flush  3 mL Intravenous Q12H  :  Allergies  Allergen Reactions  . Contrast Media [Iodinated Diagnostic Agents] Itching and Swelling  . Dilantin [Phenytoin Sodium Extended] Itching, Swelling and Other (See Comments)    Whole body  . Latex Hives and Itching  . Adhesive [Tape] Rash  :  Family History  Problem Relation Age of Onset  . Throat cancer Father   . Cervical cancer Sister   :  Social History   Social History  . Marital status: Single    Spouse name: N/A  . Number of children: N/A  . Years of education: N/A   Occupational History  . Not on file.   Social History Main Topics  . Smoking status: Former Smoker    Packs/day: 0.25    Years: 5.00    Types: Cigarettes    Quit date: 12/05/1995  . Smokeless tobacco: Never Used  . Alcohol use No  . Drug use: No  . Sexual activity: Not on file   Other Topics Concern  . Not on file   Social History Narrative  . No narrative on file  :  Pertinent items are noted in HPI.  Exam: Patient Vitals for the past 24 hrs:  BP Temp Temp src Pulse Resp SpO2 Height Weight  06/24/17 1200 139/79 99.1 F (37.3 C) Oral 79 16 98 % - -  06/24/17 1045 - - - - - 98 % - -  06/24/17 0938 135/76 98.7 F (37.1 C) Oral 90 16 98 % - -  06/24/17 0910 (!) 163/73 98.6 F (37 C) Oral 92 18 98 % - -  06/24/17 0550 133/74 98.9 F (37.2 C) Axillary 96 18 96 % - -  06/24/17 0401 138/72 98.4 F (36.9 C) Axillary (!) 101 20 97 % - -  06/24/17 0320 131/88 (!) 100.4 F (38 C) Axillary (!) 104 20 98 % - -  06/24/17 0242 132/77 99.1 F (37.3 C) Axillary (!) 102 (!) 24 98 % - -  06/23/17 2347 127/71 99.4 F (37.4 C) Oral (!) 112 20 99 % 5\' 9"  (1.753 m) 220 lb 6.4 oz (100 kg)  06/23/17 2300 (!) 148/81 - - (!) 121 (!) 37 99 % - -  06/23/17 2215 (!) 161/97 - - (!) 110 (!) 21 99 % - -  06/23/17 2145 (!) 149/95 - - Marland Kitchen)  107 (!) 33 99 % - -  06/23/17 2015 (!) 156/94 - - 97 (!) 21 100 % - -   06/23/17 1915 (!) 166/101 - - 94 (!) 28 98 % - -  06/23/17 1830 (!) 153/92 - - 91 (!) 32 100 % - -  06/23/17 1800 (!) 153/105 - - 89 (!) 28 100 % - -  06/23/17 1730 (!) 165/88 - - 91 (!) 25 100 % - -  06/23/17 1716 (!) 165/91 99.9 F (37.7 C) Oral 90 (!) 24 98 % - -    As above    Recent Labs  06/23/17 2121 06/24/17 0045  WBC 10.3 16.6*  HGB 10.5* 5.6*  HCT 34.2* 17.2*  PLT 434* 529*    Recent Labs  06/23/17 2121  NA 140  K 3.3*  CL 103  CO2 29  GLUCOSE 167*  BUN 9  CREATININE 0.65  CALCIUM 7.3*    Blood smear review:  None  Pathology: None     Assessment and Plan:  Lori [name] is a 66 year old Afro-American female. She has recurrent appendiceal mucinous adenocarcinoma. She had surgery for bowel obstruction. She has been in a skilled nursing facility. She now comes in with bleeding.  It is obvious that she is not a candidate for systemic chemotherapy.  I had a long talk with her. Some of her family was in the room.  I talked with her and she and her family understand that she has a cancer that is not curable. It is treatable but that the only way to treat her is with chemotherapy. I would only recommend chemotherapy for her if she was independent, eating,and walking. I doubt that she will ever get to that point. Her albumin is incredibly low. I will check to see what her prealbumin is.  She has an incredibly strong faith. She is convinced that God will heal her. I talked to her about this. I told her that sometimes God allows certain medical problems to occur that we cannot treat and that this is God's way of letting her "go home" so that she will be healed in heaven.  I told her that I just do not see that she is going to be able to take any chemotherapy. She understands this but still feels that she will get strong enough to take treatment.  For right now, I think the real issue is this anemia. She has GI bleeding. I'm not sure where this is coming from. I will  check her iron stores.  I will start her on some folic acid.  We had a very good prayer session. She wanted me to pray with her. We had a very long talk about what God can do. She knows that God can do anything and she truly feels that He will heal her and get her better.  For right now, we will just have to follow along. We will see what her labs show.  It was very nice to talk with her. She does have a very strong faith and has a strong will.  I  answered her questions. I answered her family's questions.  [name], MD  Hebrews 12:12

## 2017-06-24 NOTE — Consult Note (Signed)
Consultation Note Date: 06/24/2017   Patient Name: Lori [name]  DOB: 11-23-1951  MRN: [id number]  Age / Sex: 66 y.o., female  PCP: [name], MD Referring Physician: Nita Sells, MD  Reason for Consultation: Establishing goals of care  HPI/Patient Profile: 66 y.o. female  with past medical history of DM2, seizures, HTN, HLD, GERD, intracranial aneurysm repair (1997), cervical and appendiceal ca (s/p chemotherapy and radiation tx), now with recurrent Stage IV appendiceal adenocarcinoma found during last hospitalization at which time she underwent laparotomy for SBO with perforation and was discharged to SNF in [place] with wound VAC in place. She has not started therapy for recurrent appendiceal carcinoma yet with Dr. [name], but is planning to. She was admitted on 06/23/2017 with GI bleeding- noting large clots and bleeding per rectum. Workup revealed Hgb of 5.6. CT scan shows 2 fluid collections- ?abscess vs metastatic disease?Marland Kitchen Oncology consult is pending.  Clinical Assessment and Goals of Care: Met with patient at bedside.   Introduced Palliative Medicine. Palliative medicine is specialized medical care for people living with serious illness. It focuses on providing relief from the symptoms and stress of a serious illness. The goal is to improve quality of life for both the patient and the family.  Patient tells me prior to admission she enjoyed where she was living. She says she wasn't doing much there, because she hasn't been able to walk. She can't recall the last time she was able to walk. Her decrease in function has been due to weakness and fatigue. She feels she has been eating and enjoys the food they serve there and the food her family cooks for her.  She is very strong in her religious faith. She feels she is one of "God's miracle children" and that God will heal her. She understands the  doctors have told her that her cancer is incurable, but she does not believe this. Her main goal is to be able to increase her function and to be able to walk with a cane again. She was a very independent person and to her "healing" means being able to return home and live independently again. She recalls that she was able to do this after her intracranial aneurysm repair and was able to walk again in two weeks, when she was told it would take four.  Regarding code status and advance care planning she requests all aggressive measures. Her  Mother died on a ventilator so she feels that when it is her time to die, she will just die no matter what is done to her medically. Her daughter in law is a Marine scientist, so she feels her family would make good decisions for her if she were incapacitated and would not let her suffer.  She has not complaints symptomatically. No pain, nausea, depression, insomnia.    Primary Decision Maker PATIENT    SUMMARY OF RECOMMENDATIONS  -PMT will facilitate full family meeting after Oncology consult -Continue current level of care- PMT will continue to support holistically -PT consult to assist in  patient's goal of increasing independence and ambulation     Code Status/Advance Care Planning:  Full code  Prognosis:  Prognosis is very poor no matter all interventions- pt ALBUMIN IS 1.3 indicating high risk of poor healing and high risk of mortality.  Unable to determine  Discharge Planning: To Be Determined  Primary Diagnoses: Present on Admission: . Intra-abdominal abscess ([place]) . HTN (hypertension) . Primary appendiceal adenocarcinoma ([place]) . Peritoneal carcinomatosis ([place])   I have reviewed the medical record, interviewed the patient and family, and examined the patient. The following aspects are pertinent.  Past Medical History:  Diagnosis Date  . Anemia 2017   3u blood 2017  . Arthritis    KNEES  . 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 This is raised a concern of malignancy. We'll plan to move forward with laparoscopic possible open appendectomy to rule out malignancy.   . Cervical cancer, FIGO stage IIB ([place]) 05/24/2015  . Cervical carcinoma [facility]) oncologist--  dr Sondra Come for radiation/   [facility] for chemotherapy   endocervical adenocarcinoma w/  Nodal METS--  FIGO Stage IIB  . Depression   . Endometrial carcinoma [facility])    goes to [facility] in [place] for chemotherapy  . GERD (gastroesophageal reflux disease)   . Heart murmur   . History of blood transfusion   . 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 History of radiation therapy 07/28/15-08/26/15   cervical region 27.5 gray  . History of seizures    post-op craniotomy for aneurysm 1997-- controlled w/ medication since then no seizures  . Hyperlipidemia   . Hypertension   . PMB (postmenopausal bleeding)   . Pulmonary nodule 03/26/2017  . Seizures ([place])    hx of years ago   . Type 2 diabetes, diet controlled ([place])    diet controlled    Social History   Social History  . Marital status: Single    Spouse name: N/A  . Number of children: N/A  . Years of education: N/A   Social History Main Topics  . Smoking status: Former Smoker    Packs/day: 0.25    Years: 5.00    Types: Cigarettes    Quit date: 12/05/1995  . Smokeless tobacco: Never Used  . Alcohol use No  . Drug use: No  . Sexual activity: Not Asked   Other Topics Concern  . None   Social History Narrative  . None   Family History  Problem Relation Age of Onset  . Throat cancer Father   . Cervical cancer Sister    Scheduled Meds: . calcium carbonate  1 tablet Oral BID  . feeding supplement (ENSURE ENLIVE)  237 mL Oral BID BM  . gabapentin  300 mg Oral QHS  . insulin  aspart  0-15 Units Subcutaneous Q4H  . metoprolol tartrate  25 mg Oral BID  . multivitamin with minerals  1 tablet Oral QHS  . [START ON 06/27/2017] pantoprazole  40 mg Intravenous Q12H  . potassium chloride SA  20 mEq Oral Daily  . pravastatin  20 mg Oral QHS  . sodium chloride flush  3 mL Intravenous Q12H   Continuous Infusions: . dextrose 5 % and 0.45% NaCl 75 mL/hr at 06/24/17 0025  . pantoprozole (PROTONIX) infusion 8 mg/hr (  06/24/17 0657)  . piperacillin-tazobactam (ZOSYN)  IV 3.375 g (06/24/17 0730)   PRN Meds:.acetaminophen **OR** acetaminophen, alum & mag hydroxide-simeth, ondansetron **OR** ondansetron (ZOFRAN) IV, sodium chloride flush Medications Prior to Admission:  Prior to Admission medications   Medication Sig Start Date End Date Taking? Authorizing Provider  acetaminophen (TYLENOL) 325 MG tablet Take 650 mg by mouth every 6 (six) hours as needed (pain).   Yes [provider]  alum & mag hydroxide-simeth (MAALOX/MYLANTA) 200-200-20 MG/5ML suspension Take 30 mLs by mouth every 6 (six) hours as needed for indigestion or heartburn. 06/08/17  Yes Arrien, Jimmy Picket, MD  aspirin EC 81 MG tablet Take 81 mg by mouth daily.   Yes [provider]  calcium carbonate (TUMS - DOSED IN MG ELEMENTAL CALCIUM) 500 MG chewable tablet Chew 1 tablet by mouth 2 (two) times daily.   Yes [provider]  cefUROXime (CEFTIN) 500 MG tablet Take 500 mg by mouth See admin instructions. Take 1 tablet (500 mg) by mouth twice daily for UTI - 7 day course started 06/22/17 am   Yes [provider]  cholecalciferol (VITAMIN D) 1000 units tablet Take 1,000 Units by mouth daily.   Yes [provider]  cholecalciferol (VITAMIN D) 400 units TABS tablet Take 400 Units by mouth 2 (two) times daily.   Yes [provider]  famotidine (PEPCID) 20 MG tablet Take 1 tablet (20 mg total) by mouth daily. 06/09/17 07/09/17 Yes Arrien, Jimmy Picket, MD  insulin aspart  (NOVOLOG) 100 UNIT/ML injection Inject 0-20 Units into the skin every 4 (four) hours. Glucose 150-200 give 2 units, 201-250 give 4 units, 251-300 give 6 units, 301-350 give 8 units, 351 or greater give 10 units. Patient taking differently: Inject 0-10 Units into the skin See admin instructions. Inject 0-10 units twice daily (7:30am and 8:30pm) per sliding scale: CBG 0-149 0 units, 150-200 2 units, 201-250 4 units, 251-300 6 units, 301-350 8 units, 351-450 10 units 06/08/17  Yes Arrien, Jimmy Picket, MD  magnesium oxide (MAG-OX) 400 (241.3 Mg) MG tablet Take 400 mg by mouth daily.  02/28/16  Yes [provider]  metoprolol tartrate (LOPRESSOR) 25 MG tablet Take 25 mg by mouth 2 (two) times daily.   Yes [provider]  Multiple Vitamin (MULTIVITAMIN WITH MINERALS) TABS tablet Take 1 tablet by mouth at bedtime.   Yes [provider]  potassium chloride SA (K-DUR,KLOR-CON) 20 MEQ tablet Take 20 mEq by mouth daily.  04/29/15  Yes [provider]  pravastatin (PRAVACHOL) 20 MG tablet Take 20 mg by mouth at bedtime.  04/29/15  Yes [provider]  vitamin C (ASCORBIC ACID) 500 MG tablet Take 500 mg by mouth See admin instructions. Take 1 tablet (500 mg) by mouth daily for 28 days for wound healing - stop date 07/19/17   Yes [provider]  zinc gluconate 50 MG tablet Take 50 mg by mouth See admin instructions. Take 1 tablet (50 mg) by mouth daily for 14 days for wound healing - stop date 07/05/17   Yes [provider]  acetaminophen (TYLENOL) 500 MG tablet Take 1 tablet (500 mg total) by mouth every 6 (six) hours as needed for moderate pain. Patient not taking: Reported on 06/23/2017 06/08/17   Arrien, Jimmy Picket, MD  feeding supplement (BOOST / RESOURCE BREEZE) LIQD Take 1 Container by mouth 2 (two) times daily between meals. Patient not taking: Reported on 06/23/2017 06/08/17 07/08/17  Arrien, Jimmy Picket, MD  feeding supplement, ENSURE  ENLIVE,  (ENSURE ENLIVE) LIQD Take 237 mLs by mouth 2 (two) times daily between meals. Patient not taking: Reported on 06/23/2017 06/08/17   Arrien, Jimmy Picket, MD   Allergies  Allergen Reactions  . Contrast Media [Iodinated Diagnostic Agents] Itching and Swelling  . Dilantin [Phenytoin Sodium Extended] Itching, Swelling and Other (See Comments)    Whole body  . Latex Hives and Itching  . Adhesive [Tape] Rash   Review of Systems  All other systems reviewed and are negative.   Physical Exam  Nursing note and vitals reviewed.   Vital Signs: BP 135/76   Pulse 90   Temp 98.7 F (37.1 C) (Oral)   Resp 16   Ht 5' 9" (1.753 m)   Wt 100 kg (220 lb 6.4 oz)   SpO2 98%   BMI 32.55 kg/m  Pain Assessment: No/denies pain   Pain Score: 0-No pain   SpO2: SpO2: 98 % O2 Device:SpO2: 98 % O2 Flow Rate: .O2 Flow Rate (L/min): 2 L/min  IO: Intake/output summary:  Intake/Output Summary (Last 24 hours) at 06/24/17 1151 Last data filed at 06/24/17 5102  Gross per 24 hour  Intake              601 ml  Output                0 ml  Net              601 ml    LBM: Last BM Date: 06/23/17 Baseline Weight: Weight: 100 kg (220 lb 6.4 oz) Most recent weight: Weight: 100 kg (220 lb 6.4 oz)     Palliative Assessment/Data:PPS: 20%     Thank you for this consult. Palliative medicine will continue to follow and assist as needed.    Time Total: 75 mins Greater than 50%  of this time was spent counseling and coordinating care related to the above assessment and plan.  Signed by: Mariana Kaufman, AGNP-C Palliative Medicine    Please contact Palliative Medicine Team phone at [phone] for questions and concerns.  For individual provider: See Shea Evans

## 2017-06-24 NOTE — Progress Notes (Signed)
  Progress Note: General Surgery Service   Assessment/Plan: Patient Active Problem List   Diagnosis Date Noted  . Intra-abdominal abscess ([place]) 06/23/2017  . Gastrointestinal hemorrhage with melena   . Hypokalemia   . Severe sepsis with septic shock ([place])   . Peritoneal carcinomatosis ([place])   . Seizure ([place])   . SBO (small bowel obstruction) ([place]) 05/15/2017  . HTN (hypertension) 05/15/2017  . Pulmonary nodule 03/26/2017  . 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/24/2015   Acute GI bleed, history of cervical and appendiceal cancer, nonresectable, underwent palliative bypass of ileum to transfer colon in the past. -blood transfusions today -recheck labs -will continue to follow    LOS: 1 day  Chief Complaint/Subjective: No pain, no nause  Objective: Vital signs in last 24 hours: Temp:  [98.4 F (36.9 C)-100.4 F (38 C)] 98.9 F (37.2 C) (07/22 0550) Pulse Rate:  [89-121] 96 (07/22 0550) Resp:  [18-37] 18 (07/22 0550) BP: (127-166)/(71-105) 133/74 (07/22 0550) SpO2:  [96 %-100 %] 96 % (07/22 0550) Weight:  [100 kg (220 lb 6.4 oz)] 100 kg (220 lb 6.4 oz) (07/21 2347) Last BM Date: 06/23/17  Intake/Output from previous day: 07/21 0701 - 07/22 0700 In: 301 [Blood:301] Out: -  Intake/Output this shift: No intake/output data recorded.  Abd: soft, NT, vac in place and function, no surrounding erythema  Extremities: no edema  Neuro: AOx4  Lab Results: CBC   Recent Labs  06/23/17 2121 06/24/17 0045  WBC 10.3 16.6*  HGB 10.5* 5.6*  HCT 34.2* 17.2*  PLT 434* 529*   BMET  Recent Labs  06/23/17 2121  NA 140  K 3.3*  CL 103  CO2 29  GLUCOSE 167*  BUN 9  CREATININE 0.65  CALCIUM 7.3*   PT/INR No results for input(s): LABPROT, INR in the last 72 hours. ABG No results for input(s): PHART, HCO3 in the last 72 hours.  Invalid  input(s): PCO2, PO2  Studies/Results:  Anti-infectives: Anti-infectives    Start     Dose/Rate Route Frequency Ordered Stop   06/24/17 0600  piperacillin-tazobactam (ZOSYN) IVPB 3.375 g     3.375 g 12.5 mL/hr over 240 Minutes Intravenous Every 8 hours 06/23/17 2254     06/23/17 2300  piperacillin-tazobactam (ZOSYN) IVPB 3.375 g     3.375 g 100 mL/hr over 30 Minutes Intravenous  Once 06/23/17 2254 06/24/17 0056      Medications: Scheduled Meds: . calcium carbonate  1 tablet Oral BID  . feeding supplement (ENSURE ENLIVE)  237 mL Oral BID BM  . gabapentin  300 mg Oral QHS  . insulin aspart  0-15 Units Subcutaneous Q4H  . metoprolol tartrate  25 mg Oral BID  . multivitamin with minerals  1 tablet Oral QHS  . [START ON 06/27/2017] pantoprazole  40 mg Intravenous Q12H  . potassium chloride SA  20 mEq Oral Daily  . pravastatin  20 mg Oral QHS  . sodium chloride flush  3 mL Intravenous Q12H   Continuous Infusions: . dextrose 5 % and 0.45% NaCl 75 mL/hr at 06/24/17 0025  . pantoprozole (PROTONIX) infusion 8 mg/hr (06/24/17 0657)  . piperacillin-tazobactam (ZOSYN)  IV     PRN Meds:.acetaminophen **OR** acetaminophen, alum & mag hydroxide-simeth, ondansetron **OR** ondansetron (ZOFRAN) IV, sodium chloride flush  Mickeal Skinner, MD Pg# [phone] [facility]

## 2017-06-25 ENCOUNTER — Ambulatory Visit (HOSPITAL_COMMUNITY): Payer: Medicare Other

## 2017-06-25 LAB — CBC
HCT: 24.7 % — ABNORMAL LOW (ref 36.0–46.0)
HCT: 24.6 % — ABNORMAL LOW (ref 36.0–46.0)
HEMATOCRIT: 24.7 % — AB (ref 36.0–46.0)
HEMOGLOBIN: 8 g/dL — AB (ref 12.0–15.0)
HEMOGLOBIN: 8 g/dL — AB (ref 12.0–15.0)
Hemoglobin: 8 g/dL — ABNORMAL LOW (ref 12.0–15.0)
MCH: 27.2 pg (ref 26.0–34.0)
MCH: 27.3 pg (ref 26.0–34.0)
MCH: 27.6 pg (ref 26.0–34.0)
MCHC: 32.4 g/dL (ref 30.0–36.0)
MCHC: 32.4 g/dL (ref 30.0–36.0)
MCHC: 32.5 g/dL (ref 30.0–36.0)
MCV: 84 fL (ref 78.0–100.0)
MCV: 84.3 fL (ref 78.0–100.0)
MCV: 84.8 fL (ref 78.0–100.0)
PLATELETS: 371 10*3/uL (ref 150–400)
Platelets: 375 10*3/uL (ref 150–400)
Platelets: 363 10*3/uL (ref 150–400)
RBC: 2.94 MIL/uL — AB (ref 3.87–5.11)
RBC: 2.93 MIL/uL — ABNORMAL LOW (ref 3.87–5.11)
RBC: 2.9 MIL/uL — ABNORMAL LOW (ref 3.87–5.11)
RDW: 15.3 % (ref 11.5–15.5)
RDW: 15.5 % (ref 11.5–15.5)
RDW: 16.2 % — ABNORMAL HIGH (ref 11.5–15.5)
WBC: 13.5 10*3/uL — ABNORMAL HIGH (ref 4.0–10.5)
WBC: 12.6 10*3/uL — ABNORMAL HIGH (ref 4.0–10.5)
WBC: 13.2 10*3/uL — ABNORMAL HIGH (ref 4.0–10.5)

## 2017-06-25 LAB — GLUCOSE, CAPILLARY
GLUCOSE-CAPILLARY: 102 mg/dL — AB (ref 65–99)
GLUCOSE-CAPILLARY: 103 mg/dL — AB (ref 65–99)
GLUCOSE-CAPILLARY: 81 mg/dL (ref 65–99)
GLUCOSE-CAPILLARY: 87 mg/dL (ref 65–99)
Glucose-Capillary: 97 mg/dL (ref 65–99)
Glucose-Capillary: 108 mg/dL — ABNORMAL HIGH (ref 65–99)

## 2017-06-25 MED ORDER — SODIUM CHLORIDE 0.9 % IV SOLN
510.0000 mg | Freq: Once | INTRAVENOUS | Status: AC
  Administered 2017-06-25: 510 mg via INTRAVENOUS
  Filled 2017-06-25: qty 17

## 2017-06-25 NOTE — Progress Notes (Addendum)
Daily Progress Note   Patient Name: Lori [name]       Date: 06/25/2017 DOB: June 30, 1951  Age: 66 y.o. MRN#: [id number] Attending Physician: Nita Sells, MD Primary Care Physician: [name], MD Admit Date: 06/23/2017  Reason for Consultation/Follow-up: Establishing goals of care  Subjective: Met at bedside with patient, her son Lori [name], and Dr. Verlon Au. Dr. Verlon Au clearly and gently explained patient's multiple medical problems and limited options for care with curative goal.   After protracted discussion with Lori [name] and patient patient was able to state "I'm dying". She instructed her son to call her grandchildren and inform them that "their grandmother is dying". At this point she is experiencing some anger- which may be part of the grieving process as she comes to accept the fact she has a terminal illness and has limited lifetime.  Lori [name] and Lori [name] both state they are aware that she has terminal cancer. They maintain hope not that she will be cured, but that she will regain strength enough to be able to undergo chemotherapy which they hope will extend her life for some time. Time is an asset to them. We discussed that this may not come to fruition, and there may be a need to reconsider what to hope for. Perhaps hope for no pain, no suffering, meaningful time with family until death occurs.   I was also able to discuss Lori [name]'s situation with her daughter in law- Lori [name]'s wife- Lori [name]- who is a Marine scientist. Lori [name] is very realistic and has been urging Lori [name] and Lori [name] to consider Hospice. Lori [name] states they are very reluctant and have been in a strong state of denial regarding Ms. Randhawa's state of health. I voiced concern with all family members that patient's wound is not  going to heal. This is evident by her low albumin. This number also indicates a high likelihood of mortality in the near future.   We discussed the possibility of draining the abdominal abscesses by IR. Lori [name], and [place] do not want any further surgical or invasive procedures at this time. Lori [name] notes she is not having any pain and worries that further procedures might lead to more pain.  After much discussion the following GOC were elicited: no surgical procedures, stabilize patient for return to SNF, outpatient palliative followup (if available at Lori [name]), if outpatient palliative  not available- Lori [name] will continue to discuss Hospice with Lori [name]. Attempt to advance diet- if patient starts to bleed again we will have to readdress Lori [name]. Physical therapy for safety evaluation- patient would like to get out of bed.  Code status was also discussed. Patient is adamant about remaining full code.   ROS  Length of Stay: 2  Current Medications: Scheduled Meds:  . gabapentin  300 mg Oral QHS  . insulin aspart  0-15 Units Subcutaneous Q4H  . metoprolol tartrate  25 mg Oral BID    Continuous Infusions: . dextrose 5 % and 0.45% NaCl 75 mL/hr at 06/25/17 1346  . ferumoxytol    . pantoprozole (PROTONIX) infusion 8 mg/hr (06/25/17 0451)  . piperacillin-tazobactam (ZOSYN)  IV 3.375 g (06/25/17 1346)    PRN Meds: alum & mag hydroxide-simeth, morphine injection, ondansetron **OR** ondansetron (ZOFRAN) IV, sodium chloride flush  Physical Exam          Vital Signs: BP (!) 170/86 (BP Location: Right Arm)   Pulse 78   Temp 100.3 F (37.9 C) (Oral)   Resp 17   Ht 5' 9"  (1.753 m)   Wt 100 kg (220 lb 6.4 oz)   SpO2 96%   BMI 32.55 kg/m  SpO2: SpO2: 96 % O2 Device: O2 Device: Not Delivered O2 Flow Rate: O2 Flow Rate (L/min): 2 L/min  Intake/output summary:  Intake/Output Summary (Last 24 hours) at 06/25/17 1533 Last data filed at 06/25/17 1340  Gross per 24 hour  Intake           469.25  ml  Output             3525 ml  Net         -3055.75 ml   LBM: Last BM Date: 06/24/17 Baseline Weight: Weight: 100 kg (220 lb 6.4 oz) Most recent weight: Weight: 100 kg (220 lb 6.4 oz)       Palliative Assessment/Data: PPS: 10%      Patient Active Problem List   Diagnosis Date Noted  . Goals of care, counseling/discussion   . Advance care planning   . Palliative care by specialist   . Intra-abdominal abscess ([place]) 06/23/2017  . Gastrointestinal hemorrhage with melena   . Hypokalemia   . Severe sepsis with septic shock ([place])   . Peritoneal carcinomatosis ([place])   . Seizure ([place])   . SBO (small bowel obstruction) ([place]) 05/15/2017  . HTN (hypertension) 05/15/2017  . Pulmonary nodule 03/26/2017  . 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/24/2015    Palliative Care Assessment & Plan   Patient Profile: 66 y.o. female  with past medical history of DM2, seizures, HTN, HLD, GERD, intracranial aneurysm repair (1997), cervical and appendiceal ca (s/p chemotherapy and radiation tx), now with recurrent Stage IV appendiceal adenocarcinoma found during last hospitalization at which time she underwent laparotomy for SBO with perforation and was discharged to SNF in [place] with wound VAC in place. She has not started therapy for recurrent appendiceal carcinoma yet with Dr. [name], but is planning to. She was admitted on 06/23/2017 with GI bleeding- noting large clots and bleeding per rectum. Workup revealed Hgb of 5.6. CT scan shows 2 fluid collections- ?abscess vs metastatic disease?   Assessment/Recommendations/Plan   No surgical procedures   Treat what is treatable and return to SNF with outpatient palliative   Outpatient palliative followup (if available at [name]), if  outpatient palliative not available- Lori [name] will continue to discuss Hospice with Lori [name].    Attempt to advance diet- if patient starts to bleed again we will readdress GOC.   Physical therapy for safety evaluation- patient would like to get out of bed.  When possible recommend communicating with Mackie Pai regarding patient's health information- patient has limited health literacy and her complex decision making capabilities are limited without the assistance of her family- specifically Lori [name]    Goals of Care and Additional Recommendations:  Limitations on Scope of Treatment: No Surgical Procedures  Code Status:  Full code  Prognosis:   < 3 months due to aggressive cancer, not candidate for systemic therapy  Discharge Planning:  SNF  Care plan was discussed with patient, son, patient's daughter in law- Lori [name].  Thank you for allowing the Palliative Medicine Team to assist in the care of this patient.  Time in: 1430 Time out: 1545 Total time: 75 minutes Prolong time billed: yes Greater than 50%  of this time was spent counseling and coordinating care related to the above assessment and plan.  Mariana Kaufman, AGNP-C Palliative Medicine   Please contact Palliative Medicine Team phone at [phone] for questions and concerns.

## 2017-06-25 NOTE — Progress Notes (Signed)
Spoke with Ronal Fear, NP with palliative care medicine.  The family would like to hold off right now on any type of aspiration or drainage of these areas in the abdomen.  We will remove the order and be available as needed.  Jazara Swiney E 4:08 PM 06/25/2017

## 2017-06-25 NOTE — Progress Notes (Signed)
PROGRESS NOTE    Lori [name]  HUD:[id number] DOB: July 11, 1951 DOA: 06/23/2017 PCP: [name], MD  Outpatient Specialists:   Dr. [name] Dr.   Chauncey Reading- [phone].   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  Represented 05/29/17 SBO and had   Exploratory laparoscopy 05/30/2017 drainage of abscesses, repair small bowel perforation, repair small bowel enterotomy,  Enterocolostomy--course complicated by septic shock was on vasopressin as and on ICU service for 20 days until 7/22  Assessment & Plan:   Active Problems:   Primary appendiceal adenocarcinoma (HCC)   HTN (hypertension)   Seizure ([place])   Peritoneal carcinomatosis ([place])   Intra-abdominal abscess (HCC)   Goals of care, counseling/discussion   Advance care planning   Palliative care by specialist  Primary appendiceal adenocarcinoma metastatic disease stage IV Rectal bleeding Anemia of acute blood loss with likely contribution from anemia of malignancy  Source unclear--discussed with surgeon Dr. Marisue Ivan not be a good candidate for any type of scope and is best off with supportive care at this juncture at least for now--usually be scoped 6 weeks out from surgery  Hold GI consult at this time  Transfuse until hemoglobin about -no further GI bleed   transfusion threshold below 8  Iron level 13, TIBC 106--defer management to oncologist-suspect would do okay with IV iron, in the setting of likely malignancy not sure she would be a good candidate for erythropoietin agents   CEA ordered by Dr. Marin Olp still pending  Continue Protonix Gtt for at least 72 hrs--high risk re-bleeding--if bleeds again reasonable to get a tagged RBC  scan  NPO for now   ? Abscess versus fluid collection versus pseudomyxoma peritonei  IR appreciated 7/23 RE an opinion regarding? Abscess   She is a poor surgical candidate overall  Patient having low-grade fevers despite eating on broad-spectrum antibiotics raising suspicion this is not infectious in etiology  Dm ty II  Sugars 80-100  SSI coverage  Keep d5/ns 75 cc/h for now  IC aneurysm in the past  Hold ASA as bleeding  Stg II B Ca  Quiescent at this time  Htn  Continue metoprolol 25 bid  Monitor tredns  Would not aggressively control at this stage occurs of risk of rebleeding   Hold non essential meds-statin, calcium carb folate tylenol  Palliative care medicine and myself met with the patient at the bedside today together Patient is still strong in her belief that she will be healed I did ask her what we should do in case she bleeds again and she is not sure We did discuss that some of the signs and symptoms of what she is going through maybe end-of-life changes in that she is at extremely high risk of decompensation and death despite any medical measures that we may be able to provide-I did leave at this juncture the rest of the palliative discussion to Ms Mahan--we will continue to follow and support her clinically She remains a full code If she bleeds again would consider a nuclear tagged scan to localize area of bleeding  Procedures:   None planned as yet  Antimicrobials:    zosyn    Subjective:  Awake alert pleasant in no pain Wants to eat-that is her main focus No bleeding reported by rn No n/v No cp  Objective: Vitals:   06/24/17 2026 06/24/17 2118 06/24/17 2250 06/25/17 0455  BP: (!) 148/82 (!) 154/83 (!) 148/86 (!) 160/87  Pulse: 86 93 88 87  Resp: _0 Temp: 99.2 F (37.3 C) 99.6 F (37.6 C) 100.3 F (37.9 C) 99.8 F (37.7 C)  TempSrc: Oral Oral Oral Oral  SpO2: 96% 98% 98% 95%  Weight:      Height:        Intake/Output  Summary (Last 24 hours) at 06/25/17 1120 Last data filed at 06/25/17 6283  Gross per 24 hour  Intake          1910.75 ml  Output             3250 ml  Net         -1339.25 ml   Filed Weights   06/23/17 2347  Weight: 100 kg (220 lb 6.4 oz)    Examination:  eomi ncat s1 s 2no m/r/g cta abd soft wound vac in place nuero intact- Not as confused      Data Reviewed: I have personally reviewed following labs and imaging studies  CBC:  Recent Labs Lab 06/23/17 2121 06/24/17 0045 06/24/17 1739 06/25/17 0119 06/25/17 0939  WBC 10.3 16.6* 13.4* 13.5* 12.6*  NEUTROABS 8.5*  --   --   --   --   HGB 10.5* 5.6* 6.8* 8.0* 8.0*  HCT 34.2* 17.2* 21.1* 24.7* 24.7*  MCV 86.8 86.0 84.1 84.0 84.3  PLT 434* 529* 373 375 151   Basic Metabolic Panel:  Recent Labs Lab 06/23/17 2121 06/24/17 1739  NA 140 142  K 3.3* 3.5  CL 103 106  CO2 29 28  GLUCOSE 167* 103*  BUN 9 14  CREATININE 0.65 0.77  CALCIUM 7.3* 7.1*   GFR: Estimated Creatinine Clearance: 87 mL/min (by C-G formula based on SCr of 0.77 mg/dL). Liver Function Tests:  Recent Labs Lab 06/23/17 2121  AST 18  ALT 11*  ALKPHOS 89  BILITOT 0.4  PROT 5.3*  ALBUMIN 1.3*   No results for input(s): LIPASE, AMYLASE in the last 168 hours. No results for input(s): AMMONIA in the last 168 hours. Coagulation Profile: No results for input(s): INR, PROTIME in the last 168 hours. Cardiac Enzymes: No results for input(s): CKTOTAL, CKMB, CKMBINDEX, TROPONINI in the last 168 hours. BNP (last 3 results) No results for input(s): PROBNP in the last 8760 hours. HbA1C: No results for input(s): HGBA1C in the last 72 hours. CBG:  Recent Labs Lab 06/24/17 1654 06/24/17 1941 06/25/17 0022 06/25/17 0348 06/25/17 0733  GLUCAP 75 84 87 81 97   Lipid Profile: No results for input(s): CHOL, HDL, LDLCALC, TRIG, CHOLHDL, LDLDIRECT in the last 72 hours. Thyroid Function Tests: No results for input(s): TSH, T4TOTAL, FREET4,  T3FREE, THYROIDAB in the last 72 hours. Anemia Panel:  Recent Labs  06/24/17 1739  FERRITIN 680*  TIBC 106*  IRON 13*   Urine analysis:    Component Value Date/Time   COLORURINE AMBER (A) 05/15/2017 0230   APPEARANCEUR CLEAR 05/15/2017 0230   LABSPEC 1.028 05/15/2017 0230   LABSPEC 1.010 08/17/2015 1542   PHURINE 5.0 05/15/2017 0230   GLUCOSEU NEGATIVE 05/15/2017 0230   GLUCOSEU 100 08/17/2015 1542   HGBUR NEGATIVE 05/15/2017 0230   BILIRUBINUR NEGATIVE 05/15/2017 0230   BILIRUBINUR Negative 08/17/2015 1542   KETONESUR NEGATIVE 05/15/2017 0230   PROTEINUR 30 (A) 05/15/2017 0230   UROBILINOGEN 1.0 08/26/2015 0725   UROBILINOGEN 0.2 08/17/2015 1542  NITRITE NEGATIVE 05/15/2017 0230   LEUKOCYTESUR NEGATIVE 05/15/2017 0230   LEUKOCYTESUR Trace 08/17/2015 1542    )No results found for this or any previous visit (from the past 240 hour(s)).       Radiology Studies: Ct Abdomen Pelvis Wo Contrast  Result Date: 06/23/2017 CLINICAL DATA:  GI bleed. Melena. History of cervical cancer. History of appendiceal cancer. EXAM: CT ABDOMEN AND PELVIS WITHOUT CONTRAST TECHNIQUE: Multidetector CT imaging of the abdomen and pelvis was performed following the standard protocol without IV contrast. COMPARISON:  05/15/2017 FINDINGS: Lower chest: The heart is enlarged. Tiny pericardial effusion is similar to prior. There is dependent atelectasis in the lower lungs with moderate right and small left pleural effusions. Hepatobiliary: No focal abnormality in the liver on this study without intravenous contrast. Gallbladder is decompressed. The possible tiny stone in the gallbladder neck. No intrahepatic or extrahepatic biliary dilation. Pancreas: No focal mass lesion. No dilatation of the main duct. No intraparenchymal cyst. No peripancreatic edema. Spleen: No splenomegaly. No focal mass lesion. Adrenals/Urinary Tract: No adrenal nodule or mass. Left kidney is atrophic as before. There is new bilateral  mild hydroureteronephrosis. The bladder is distended. Tiny air bubble in the bladder lumen may be related to recent instrumentation although bladder infection can cause this appearance. Stomach/Bowel: Stomach is mildly distended and fluid-filled. Duodenum unremarkable. No overt small bowel obstruction. The terminal ileum is normal. Suture line in the region of the mid transverse colon is compatible with the clinical history of enterocolostomy from the mid ileum to the mid transverse colon. Left colon unremarkable. There is edema a congestion of the small bowel mesentery with small pockets of interloop mesenteric fluid. Vascular/Lymphatic: There is abdominal aortic atherosclerosis without aneurysm. There is no gastrohepatic or hepatoduodenal ligament lymphadenopathy. No intraperitoneal or retroperitoneal lymphadenopathy. No pelvic sidewall lymphadenopathy. Reproductive: Uterus appears enlarged. Endometrium is thickened in this patient with a history of cervical cancer. There is no adnexal mass. Other: Focal fluid collection is identified in the right lower quadrant. This is difficult to assess without oral or intravenous contrast material, but it appears to have a circumferential rim and does not appear to represent bowel. There is another fluid collection inferior to the liver tracking down the right peritoneal cavity and into the right para colic gutter. This collection measures 8.8 cm in craniocaudal length by 7.4 cm and AP dimension by 2.5 cm coronally. This also has a rim of increased attenuation raising concern for abscess. Musculoskeletal: Open midline wound identified with packing material. Diffuse body wall edema evident. Stable appearance sclerotic lesion right acetabulum. Bone windows reveal no worrisome lytic or sclerotic osseous lesions. IMPRESSION: 1. Patient is approximately 3 weeks out from diagnostic laparoscopy with abscess drainage, repair of small bowel perforation, and entero colostomy. On  today's exam, there are 2 apparent fluid collections identified, but are difficult to assess given the lack of intravenous and oral contrast. One of these is just inferior to the liver in tracks down the right paracolic gutter. The second is in the right lower quadrant mesentery. Given the history of recent surgery both could represent intraperitoneal abscess. However, with the history of appendiceal carcinoma, pseudomyxoma peritonei is also a consideration. 2. No evidence for bowel obstruction. No bowel wall thickening is evident. There is fairly diffuse mesenteric edema and vascular congestion with scattered collections of interloop mesenteric fluid. The degree of these changes is more advanced than would be expected 3 weeks out from surgery. 3. Dependent atelectasis in both lower lungs with moderate right  and small left pleural effusions. 4. Tiny air bubble in the urinary bladder may be from recent instrumentation, but bladder infection can have this appearance. 5. Thickening of the endometrial stripe in this patient with history of cervical cancer. Electronically Signed   By: Misty Stanley M.D.   On: 06/23/2017 20:32     Scheduled Meds: . gabapentin  300 mg Oral QHS  . insulin aspart  0-15 Units Subcutaneous Q4H  . metoprolol tartrate  25 mg Oral BID   Continuous Infusions: . dextrose 5 % and 0.45% NaCl 75 mL/hr at 06/24/17 2353  . pantoprozole (PROTONIX) infusion 8 mg/hr (06/25/17 0451)  . piperacillin-tazobactam (ZOSYN)  IV Stopped (06/25/17 0951)     LOS: 2 days    Time spent: [place], MD [facility]   If 7PM-7AM, please contact night-coverage [email or web address] Password [email or web address] 06/25/2017, 11:20 AM

## 2017-06-25 NOTE — Progress Notes (Signed)
Initial Nutrition Assessment  DOCUMENTATION CODES:   Obesity unspecified  INTERVENTION:   -RD will follow for diet advancement and supplement as appropriate -RD will adapt nutrition care plan based upon [place] discussions  NUTRITION DIAGNOSIS:   Inadequate oral intake related to altered GI function as evidenced by NPO status.  GOAL:   Patient will meet greater than or equal to 90% of their needs  MONITOR:   Diet advancement, Labs, Weight trends, Skin, I & O's  REASON FOR ASSESSMENT:   Consult Assessment of nutrition requirement/status  ASSESSMENT:   Lori [name] is a 66 y.o. female with medical history significant of DM2, Seizures (not on medication), HTN, HLD, GERD, cervical and appendiceal carcinoma, intracranial aneurysm repair in 1997 who presents for rectal bleeding.  Reviewed records from [facility] and [facility]. Pt was on a mechanical soft, NAS diet PTA. Pertinent medications include K: vitamin D 3, vitamin C, Mg, zinc, MVI, vitamin D, sliding scale insulin.   Pt currently NPO. Per MD notes, she is requesting to eat. 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 Per oncology notes, not recommending pursuing chemotherapy at this time secondary to weakness (unable to walk).    Wt hx reviewed. No wt loss over the past year.   Unable to speak with pt at time of visit. Nutrition-focused physical exam deferred at this time. Pt with very poor prognosis; palliative care to set up goals of care meeting with family.   Labs reviewed: CBGS: 81-108.   Diet Order:  Diet NPO time specified Except for: Sips with Meds  Skin:  Wound (see comment) (closed perineal incision, wound vac to abdominal incision)  Last BM:  06/24/17  Height:   Ht Readings from Last 1 Encounters:  06/23/17 5\' 9"  (1.753 m)    Weight:   Wt Readings from Last 1 Encounters:  06/23/17 220 lb 6.4 oz (100 kg)    Ideal Body Weight:  65.9 kg  BMI:  Body mass index  is 32.55 kg/m.  Estimated Nutritional Needs:   Kcal:  1800-2000  Protein:  105-120 grams  Fluid:  > 1.8 L  EDUCATION NEEDS:   Education needs no appropriate at this time  Okey Zelek A. Jimmye Norman, RD, LDN, CDE Pager: [phone] After hours Pager: [phone]

## 2017-06-25 NOTE — Progress Notes (Signed)
Imaging has been reviewed by Dr. Annamaria Boots.  We can aspirate/drain both collections.  Pending what is aspirated would depend on drain placement vs just aspiration.  This can not be done today due to a full schedule, but also appears surgery has suggested we wait until palliative has seen the patient for [place] meeting prior to proceeding as well.  We will follow and arrange if this is felt appropriate to pursue after [place] meeting.  Lori [name] E 10:22 AM 06/25/2017

## 2017-06-25 NOTE — Evaluation (Addendum)
Physical Therapy Evaluation Patient Details Name: Lori [name] MRN: [id number] DOB: 04/13/1951 Today's Date: 06/25/2017   History of Present Illness  Lori [name] is a very nice 66 year old African-American female. She lives in [place]. She had a sore toward surgery about a month ago. 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 She probably was diagnosed with this a year ago after having an appendectomy. Chemotherapy and radiotherapy.  Non-healed intra-abdominal abcess with VAC in place as complication after prior surgeries. Pt has other new abcesses that MD has explained that no further treatment will be successful and MD recommending palliative care discussions with son and pt.   Clinical Impression  Pt admitted with above diagnosis. Pt currently with functional limitations due to the deficits listed below (see PT Problem List). Pt was unable to sit EOB without extensive assist up to total assist at times due to severe posterior lean. Will follow acutely as pt able.   Pt will benefit from skilled PT to increase their independence and safety with mobility to allow discharge to the venue listed below.      Follow Up Recommendations SNF;Supervision/Assistance - 24 hour    Equipment Recommendations  Wheelchair (measurements PT)    Recommendations for Other Services       Precautions / Restrictions Precautions Precautions: Fall Precaution Comments: wound vac Restrictions Weight Bearing Restrictions: No      Mobility  Bed Mobility Overal bed mobility: Needs Assistance Bed Mobility: Supine to Sit;Sit to Supine Rolling: Mod assist   Supine to sit: Total assist;+2 for physical assistance (pt = 15%) Sit to supine: Total assist;+2 for physical assistance (pt =15%)   General bed mobility comments: used  pad. Needed total assist to move LES off bed with pt intiating movement but lacks follow through.   Pt initiated bending LLE and reaching with RUE, Pt  = 5%. ASsisted NT wtih placement of new pads and bedpan at end of treatment.   Transfers Overall transfer level: Needs assistance Equipment used: Rolling walker (2 wheeled) Transfers: Sit to/from Stand Sit to Stand: Total assist;+2 physical assistance (Pt = 25%)         General transfer comment: Pt stood partial stand to RW however left LE extended in front of her with incr hip and knee flexion such that pt was unable to stand greater than 5 seconds.  Pt immediately incr posterior lean when she stood making standing unacheivable.    Ambulation/Gait             General Gait Details: unable  Stairs            Wheelchair Mobility    Modified Rankin (Stroke Patients Only)       Balance Overall balance assessment: Needs assistance Sitting-balance support: Bilateral upper extremity supported;Feet supported Sitting balance-Leahy Scale: Poor Sitting balance - Comments: Pt sat EOB 5 minutes with varying assist min to total assist due to pt with significant posterior lean throughout.  Pt only achieved min assist sitting when holding onto RW with bil hands.  Pt had poor postural control with tendency for trunk and LE extension.  Postural control: Posterior lean                                   Pertinent Vitals/Pain Pain Assessment: Faces Faces Pain Scale: Hurts even more Pain Location: abdomen Pain Descriptors / Indicators: Aching;Grimacing;Guarding Pain Intervention(s): Limited activity within patient's tolerance;Monitored during session;Premedicated before  session;Repositioned    Home Living Family/patient expects to be discharged to:: Skilled nursing facility Living Arrangements: Children Available Help at Discharge: Family Type of Home: House       Home Layout: One level Home Equipment: Kasandra Knudsen - single point;Walker - 4 wheels;Toilet riser      Prior Function Level of Independence: Independent with assistive device(s)   Gait / Transfers  Assistance Needed: parallel bars at [facility] per pt  ADL's / Homemaking Assistance Needed: Assist by nursing        Hand Dominance        Extremity/Trunk Assessment   Upper Extremity Assessment Upper Extremity Assessment: Defer to OT evaluation    Lower Extremity Assessment Lower Extremity Assessment: RLE deficits/detail;LLE deficits/detail RLE Deficits / Details: appears grossly 2/5 LLE Deficits / Details: appears grossly 2/5    Cervical / Trunk Assessment Cervical / Trunk Exceptions: decreased strength to sit up  Communication   Communication: No difficulties  Cognition Arousal/Alertness: Awake/alert Behavior During Therapy: Anxious;WFL for tasks assessed/performed Overall Cognitive Status: Impaired/Different from baseline Area of Impairment: Problem solving                       Following Commands: Follows one step commands with increased time     Problem Solving: Difficulty sequencing;Requires verbal cues;Requires tactile cues General Comments: Pt interactive and in good spirits initially during treatment      General Comments General comments (skin integrity, edema, etc.): VAC in place    Exercises General Exercises - Lower Extremity Ankle Circles/Pumps: AROM;10 reps   Assessment/Plan    PT Assessment Patient needs continued PT services  PT Problem List Decreased strength;Decreased range of motion;Decreased activity tolerance;Decreased balance;Decreased mobility;Decreased knowledge of precautions;Decreased safety awareness;Decreased knowledge of use of DME;Pain       PT Treatment Interventions Functional mobility training;Therapeutic activities;Therapeutic exercise;Patient/family education;Wheelchair mobility training    PT Goals (Current goals can be found in the Care Plan section)  Acute Rehab PT Goals Patient Stated Goal: to go back to Rehab and continue to work in parallel bars PT Goal Formulation: With patient Time For Goal Achievement:  07/09/17 Potential to Achieve Goals: Fair    Frequency Min 2X/week   Barriers to discharge Decreased caregiver support      Co-evaluation               AM-PAC PT "6 Clicks" Daily Activity  Outcome Measure Difficulty turning over in bed (including adjusting bedclothes, sheets and blankets)?: Total Difficulty moving from lying on back to sitting on the side of the bed? : Total Difficulty sitting down on and standing up from a chair with arms (e.g., wheelchair, bedside commode, etc,.)?: Total Help needed moving to and from a bed to chair (including a wheelchair)?: Total Help needed walking in hospital room?: Total Help needed climbing 3-5 steps with a railing? : Total 6 Click Score: 6    End of Session Equipment Utilized During Treatment: Gait belt Activity Tolerance: Patient limited by fatigue;Patient limited by pain Patient left: in bed;with call bell/phone within reach;with family/visitor present Nurse Communication: Mobility status;Need for lift equipment (recommend [employer]) PT Visit Diagnosis: Difficulty in walking, not elsewhere classified (R26.2);Pain;Muscle weakness (generalized) (M62.81);Unsteadiness on feet (R26.81) Pain - part of body:  (abdomen)    Time: 0076-2263 PT Time Calculation (min) (ACUTE ONLY): 27 min   Charges:   PT Evaluation $PT Eval Moderate Complexity: 1 Procedure PT Treatments $Therapeutic Activity: 8-22 mins   PT G Codes:  [facility] Acute Rehabilitation [phone] (pager)   Denice Paradise 06/25/2017, 5:17 PM

## 2017-06-25 NOTE — Progress Notes (Signed)
CC:  Bloody bowel movements  Subjective: She cannot tell me why she is here, says she didn't want to come back.  Wants to know if she can eat or have ice.  Wound vac in place. Few BS, she does not seem real tender.  Low grade fever recorded yesterday.    Objective: Vital signs in last 24 hours: Temp:  [97.9 F (36.6 C)-100.3 F (37.9 C)] 99.8 F (37.7 C) (07/23 0455) Pulse Rate:  [79-93] 87 (07/23 0455) Resp:  [16-18] 18 (07/23 0455) BP: (135-163)/(73-87) 160/87 (07/23 0455) SpO2:  [95 %-98 %] 95 % (07/23 0455) Last BM Date: 06/24/17 2201 IV 3150 urine Drain 100 TM 100,  VSS BP up some.   WBC is still up H/H is down CT scan 06/23/17:  2 apparent fluid collections identified, but are difficult to assess given the lack of intravenous and oral contrast. One of these is just inferior to the liver in tracks down the right paracolic gutter. The second is in the right lower quadrant mesentery. Given the history of recent surgery both could represent intraperitoneal abscess. However, with the history of appendiceal carcinoma, pseudomyxoma peritonei is also a consideration.   Intake/Output from previous day: 07/22 0701 - 07/23 0700 In: 2210.8 [I.V.:1188.8; Blood:922; IV Piggyback:100] Out: 8891 [Urine:3150; Drains:100] Intake/Output this shift: No intake/output data recorded.  General appearance: alert, cooperative, no distress and not sure why she is here. GI: soft, wound vac in place, few BS, foley also in place  Lab Results:   Recent Labs  06/24/17 1739 06/25/17 0119  WBC 13.4* 13.5*  HGB 6.8* 8.0*  HCT 21.1* 24.7*  PLT 373 375    BMET  Recent Labs  06/23/17 2121 06/24/17 1739  NA 140 142  K 3.3* 3.5  CL 103 106  CO2 29 28  GLUCOSE 167* 103*  BUN 9 14  CREATININE 0.65 0.77  CALCIUM 7.3* 7.1*   PT/INR No results for input(s): LABPROT, INR in the last 72 hours.   Recent Labs Lab 06/23/17 2121  AST 18  ALT 11*  ALKPHOS 89  BILITOT 0.4  PROT  5.3*  ALBUMIN 1.3*     Lipase     Component Value Date/Time   LIPASE 31 05/14/2017 2106     Medications: . gabapentin  300 mg Oral QHS  . insulin aspart  0-15 Units Subcutaneous Q4H  . metoprolol tartrate  25 mg Oral BID   . dextrose 5 % and 0.45% NaCl 75 mL/hr at 06/24/17 2353  . pantoprozole (PROTONIX) infusion 8 mg/hr (06/25/17 0451)  . 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 Intra-abdominal abscess secondary to contain small bowel perforation. S/p . 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 2.  Exploratory laparotomy, drainage of abdominal abscess, repair of small bowel perforation, repair of small bowel enterotomy, enterocolostomy (mid  ileum to mid transverse colon).05/30/17, Dr. Jackolyn Confer.  Findings: There is diffuse carcinomatosis with a frozen pelvis. The tumor was heavily concentrated in the right lower quadrant and distal ileum leading to an obstruction. There are multiple peritoneal nodules present as well as multiple nodules on the small bowel in the small bowel mesentery, and sigmoid colon  Hx of IIB endocervical adenocarcinoma s/p chemoradiation Hx of appendiceal mucinous adenocarcinoma s/p appendectomy HTN Hx of cerebral aneurysm repair Hx of seizures Type II DM - diet controlled  FEN - IV fluids/NPO VTE - SCDs,  No anticoagulants due to GI bleeding. ID - Zosyn 06/23/17 =>> day 3     PLan:  I will look and redress wound vac tomorrow, on a TTS schedule.     Ask IR to see and see if sites are able to have drain or aspiration.  I would not do these until everyone has an opportunity to review and agree on this plan. As far as the bleeding goes, will review with DR. Connor.  She just had surgery on 05/30/17.  Dr. Marin Olp and Palliative is following.      LOS: 2 days    Lori [name] 06/25/2017 [phone]

## 2017-06-26 LAB — BASIC METABOLIC PANEL
ANION GAP: 9 (ref 5–15)
BUN: 15 mg/dL (ref 6–20)
CALCIUM: 7.3 mg/dL — AB (ref 8.9–10.3)
CHLORIDE: 109 mmol/L (ref 101–111)
CO2: 28 mmol/L (ref 22–32)
Creatinine, Ser: 1.09 mg/dL — ABNORMAL HIGH (ref 0.44–1.00)
GFR calc Af Amer: 60 mL/min — ABNORMAL LOW (ref >60)
GFR calc non Af Amer: 52 mL/min — ABNORMAL LOW (ref >60)
GLUCOSE: 122 mg/dL — AB (ref 65–99)
POTASSIUM: 2.4 mmol/L — AB (ref 3.5–5.1)
Sodium: 146 mmol/L — ABNORMAL HIGH (ref 135–145)

## 2017-06-26 LAB — CBC
HEMATOCRIT: 24.3 % — AB (ref 36.0–46.0)
Hemoglobin: 7.8 g/dL — ABNORMAL LOW (ref 12.0–15.0)
MCH: 27.5 pg (ref 26.0–34.0)
MCHC: 32.1 g/dL (ref 30.0–36.0)
MCV: 85.6 fL (ref 78.0–100.0)
Platelets: 366 10*3/uL (ref 150–400)
RBC: 2.84 MIL/uL — ABNORMAL LOW (ref 3.87–5.11)
RDW: 15.8 % — AB (ref 11.5–15.5)
WBC: 12.1 10*3/uL — AB (ref 4.0–10.5)

## 2017-06-26 LAB — GLUCOSE, CAPILLARY
GLUCOSE-CAPILLARY: 103 mg/dL — AB (ref 65–99)
Glucose-Capillary: 111 mg/dL — ABNORMAL HIGH (ref 65–99)
Glucose-Capillary: 119 mg/dL — ABNORMAL HIGH (ref 65–99)
Glucose-Capillary: 124 mg/dL — ABNORMAL HIGH (ref 65–99)
Glucose-Capillary: 89 mg/dL (ref 65–99)
Glucose-Capillary: 94 mg/dL (ref 65–99)

## 2017-06-26 LAB — CEA: CEA1: 8.9 ng/mL — AB (ref 0.0–4.7)

## 2017-06-26 MED ORDER — POTASSIUM CHLORIDE 2 MEQ/ML IV SOLN: INTRAVENOUS | Status: DC

## 2017-06-26 MED ORDER — DEXTROSE-NACL 5-0.45 % IV SOLN
INTRAVENOUS | Status: DC
  Administered 2017-06-26 – 2017-06-30 (×6): via INTRAVENOUS
  Filled 2017-06-26 (×12): qty 1000

## 2017-06-26 MED ORDER — DEXTROSE-NACL 5-0.45 % IV SOLN: INTRAVENOUS | Status: DC

## 2017-06-26 NOTE — Progress Notes (Signed)
patient is from [facility] and able to return if family agreeable and pt appropriate for rehab  Per palliative note plan for meeting tomorrow at 12pm to discuss [place] and possible hospice  CSW will continue to follow and assist with disposition planning as needed  Jorge Ny, LCSW Clinical Social Worker [phone] '

## 2017-06-26 NOTE — Progress Notes (Signed)
CRITICAL VALUE ALERT  Critical Value:  Potassium 2.4  Date & Time Notied:  06/26/17 0730  Provider Notified: Verlon Au  Orders Received/Actions taken: Did not receive call back, but he made adjustments in patient's IV fluid orders.

## 2017-06-26 NOTE — Progress Notes (Signed)
   Wound vac is off and this is finding.  Will stop wound vac and go back to wet to dry dressings BID.

## 2017-06-26 NOTE — NC FL2 (Addendum)
[place] LEVEL OF CARE SCREENING TOOL     IDENTIFICATION  Patient Name: Lori [name] Birthdate: 1951/11/10 Sex: female Admission Date (Current Location): 06/23/2017  [facility] and [place] Number:  Herbalist and Address:  [facility] [place] [address] [place], [place] [address]      Provider Number: [id number]  Attending Physician Name and Address:  Nita Sells, MD  Relative Name and Phone Number:       Current Level of Care: Hospital Recommended Level of Care: [place] Prior Approval Number:    Date Approved/Denied:   PASRR Number: [id number]  Discharge Plan: SNF    Current Diagnoses: Patient Active Problem List   Diagnosis Date Noted  . Goals of care, counseling/discussion   . Advance care planning   . Palliative care by specialist   . Intra-abdominal abscess ([place]) 06/23/2017  . Gastrointestinal hemorrhage with melena   . Hypokalemia   . Severe sepsis with septic shock ([place])   . Peritoneal carcinomatosis ([place])   . Seizure ([place])   . SBO (small bowel obstruction) ([place]) 05/15/2017  . HTN (hypertension) 05/15/2017  . Pulmonary nodule 03/26/2017  . Primary appendiceal adenocarcinoma ([place]) 04/11/2016  . 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/24/2015    Orientation RESPIRATION BLADDER Height & Weight     Self, Time, Situation, Place  Normal Incontinent Weight: 220 lb 6.4 oz (100 kg) Height:  5\' 9"  (175.3 cm)  BEHAVIORAL SYMPTOMS/MOOD NEUROLOGICAL BOWEL NUTRITION STATUS      Incontinent Diet (see DC summary)  AMBULATORY STATUS COMMUNICATION OF NEEDS Skin   Extensive Assist Verbally Wound Vac (located on abdomin)                       Personal Care Assistance Level of Assistance  Bathing, Dressing Bathing Assistance: Maximum assistance   Dressing Assistance: Maximum assistance     Functional Limitations  Info             SPECIAL CARE FACTORS FREQUENCY  PT (By licensed PT), OT (By licensed OT)     PT Frequency: 5/wk OT Frequency: 5/wk            Contractures      Additional Factors Info  Code Status, Allergies Code Status Info: FULL Allergies Info: Contrast Media Iodinated Diagnostic Agents, Dilantin Phenytoin Sodium Extended, Latex, Adhesive Tape           Current Medications (06/26/2017):  This is the current hospital active medication list Current Facility-Administered Medications  Medication Dose Route Frequency Provider Last Rate Last Dose  . alum & mag hydroxide-simeth (MAALOX/MYLANTA) 200-200-20 MG/5ML suspension 30 mL  30 mL Oral Q6H PRN Gilles Chiquito B, MD      . dextrose 5 % and 0.45% NaCl 1,000 mL with potassium chloride 60 mEq infusion   Intravenous Continuous Nita Sells, MD 75 mL/hr at 06/26/17 1100    . gabapentin (NEURONTIN) capsule 300 mg  300 mg Oral QHS Gilles Chiquito B, MD   300 mg at 06/25/17 2110  . insulin aspart (novoLOG) injection 0-15 Units  0-15 Units Subcutaneous Q4H Sid Falcon, MD   2 Units at 06/26/17 0444  . metoprolol tartrate (LOPRESSOR) tablet 25 mg  25 mg Oral BID Sid Falcon, MD   25 mg at 06/26/17 0902  . morphine 4 MG/ML injection 1 mg  1 mg Intravenous Q3H PRN Nita Sells, MD  1 mg at 06/24/17 2349  . ondansetron (ZOFRAN) tablet 4 mg  4 mg Oral Q6H PRN Sid Falcon, MD       Or  . ondansetron [facility]) injection 4 mg  4 mg Intravenous Q6H PRN Gilles Chiquito B, MD      . pantoprazole (PROTONIX) 80 mg in sodium chloride 0.9 % 250 mL (0.32 mg/mL) infusion  8 mg/hr Intravenous Continuous Opyd, Ilene Qua, MD 25 mL/hr at 06/26/17 1325 8 mg/hr at 06/26/17 1325  . piperacillin-tazobactam (ZOSYN) IVPB 3.375 g  3.375 g Intravenous Q8H Gilles Chiquito B, MD 12.5 mL/hr at 06/26/17 1443 3.375 g at 06/26/17 1443  . sodium chloride flush (NS) 0.9 % injection 10-40 mL  10-40 mL Intracatheter PRN Nita Sells, MD   10 mL  at 06/25/17 1746   Facility-Administered Medications Ordered in Other Encounters  Medication Dose Route Frequency Provider Last Rate Last Dose  . sodium chloride flush (NS) 0.9 % injection 10 mL  10 mL Intravenous PRN Baird Cancer, PA-C   10 mL at 03/26/17 1050     Discharge Medications: Please see discharge summary for a list of discharge medications.  Relevant Imaging Results:  Relevant Lab Results:   Additional Information SS # [id number]  Jorge Ny, LCSW

## 2017-06-26 NOTE — Progress Notes (Signed)
PROGRESS NOTE    Lori [name]  SKA:[id number] DOB: Jan 06, 1951 DOA: 06/23/2017 PCP: [name], MD  Outpatient Specialists:   Dr. [name] Dr.   Chauncey Reading- [phone].   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  Represented 05/29/17 SBO and had   Exploratory laparoscopy 05/30/2017 drainage of abscesses, repair small bowel perforation, repair small bowel enterotomy,  Enterocolostomy--course complicated by septic shock was on vasopressin as and on ICU service for 20 days until 7/22  Assessment & Plan:   Active Problems:   Primary appendiceal adenocarcinoma (HCC)   HTN (hypertension)   Seizure ([place])   Peritoneal carcinomatosis ([place])   Intra-abdominal abscess (HCC)   Goals of care, counseling/discussion   Advance care planning   Palliative care by specialist  Primary appendiceal adenocarcinoma metastatic disease stage IV Rectal bleeding of unknown casue Anemia of acute blood loss with likely contribution from anemia of malignancy  Source unclear--discussed with surgeon Dr. Marisue Ivan not be a good candidate for any type of scope and is best off with supportive care at this juncture at least for now--usually be scoped 6 weeks out from surgery  Hold GI consult at this time  Transfuse until hemoglobin about -has had some maroon stool 7/24   transfusion threshold below 8  Iron level 13, TIBC 106--defer management to oncologist-suspect would do okay with IV iron, in  malignancy not sure she would be a good candidate for erythropoietin agents    CEA ordered by Dr. Marin Olp is 8.9--he is aware--recs f/u dr zhou follow  Protonix Gtt for at least 26 hrs--high risk re-bleeding--she wants to  drink--started clear with the understanding would probably need to stop if she does bleed   ? Abscess versus fluid collection versus pseudomyxoma peritonei  IR appreciated 7/23 RE an opinion regarding? Abscess --Pallaitve and HCPOA proxy Seth Bake want NO MORE PROCEDURES-meeting 7/25  She is a poor surgical candidate overall  Patient having low-grade fevers despite eating on broad-spectrum antibiotics raising suspicion this is not infectious in etiology  Dm ty II  Sugars 80-100  SSI coverage  Keep fluids as below  IC aneurysm in the past  Hold ASA as bleeding  Stg II B Ca  Quiescent at this time  Hypokalemia mmild contraction alkalosis  D5/Ns with 60 of K  Labs am  Htn  Continue metoprolol 25 bid  Monitor tredns  Would not aggressively control at this stage occurs of risk of rebleeding   Hold non essential meds-statin, calcium carb folate tylenol   She remains a full code If she bleeds large amounts again , could consider a nuclear tagged scan but doesn't want furthe rprocedures  Procedures:   None planned as yet  Antimicrobials:    zosyn    Subjective:  Upset with this examiner initially Her main focus is "can I have something to drink" otherwise looks great No cp Mild abd pain Wound pix in chart per Surgery    Objective: Vitals:   06/25/17 2004 06/26/17 0403 06/26/17 0902 06/26/17 1235  BP: (!) 166/89 (!) 160/88 (!) 170/89 (!) 170/84  Pulse: 84 86  84  Resp: 17 17  17   Temp: (!) 100.5 F (38.1 C) 99.7 F (37.6 C)  98.9 F (37.2 C)  TempSrc: Oral Axillary  Axillary  SpO2: 96% 97%  97%  Weight:  Height:        Intake/Output Summary (Last 24 hours) at 06/26/17 1735 Last data filed at 06/26/17 1337  Gross per 24 hour  Intake          1237.91 ml  Output             1000 ml  Net           237.91 ml   Filed Weights   06/23/17 2347  Weight: 100 kg (220 lb 6.4 oz)    Examination:  eomi ncat s1 s 2no m/r/g cta  Dressing on belly-dd not  examine wound but no pain on pressure nuero intact- Not as confused  Data Reviewed: I have personally reviewed following labs and imaging studies  CBC:  Recent Labs Lab 06/23/17 2121  06/24/17 1739 06/25/17 0119 06/25/17 0939 06/25/17 1740 06/26/17 0628  WBC 10.3  < > 13.4* 13.5* 12.6* 13.2* 12.1*  NEUTROABS 8.5*  --   --   --   --   --   --   HGB 10.5*  < > 6.8* 8.0* 8.0* 8.0* 7.8*  HCT 34.2*  < > 21.1* 24.7* 24.7* 24.6* 24.3*  MCV 86.8  < > 84.1 84.0 84.3 84.8 85.6  PLT 434*  < > 373 375 363 371 366  < > = values in this interval not displayed. Basic Metabolic Panel:  Recent Labs Lab 06/23/17 2121 06/24/17 1739 06/26/17 0628  NA 140 142 146*  K 3.3* 3.5 2.4*  CL 103 106 109  CO2 29 28 28   GLUCOSE 167* 103* 122*  BUN 9 14 15   CREATININE 0.65 0.77 1.09*  CALCIUM 7.3* 7.1* 7.3*   GFR: Estimated Creatinine Clearance: 63.9 mL/min (A) (by C-G formula based on SCr of 1.09 mg/dL (H)). Liver Function Tests:  Recent Labs Lab 06/23/17 2121  AST 18  ALT 11*  ALKPHOS 89  BILITOT 0.4  PROT 5.3*  ALBUMIN 1.3*   No results for input(s): LIPASE, AMYLASE in the last 168 hours. No results for input(s): AMMONIA in the last 168 hours. Coagulation Profile: No results for input(s): INR, PROTIME in the last 168 hours. Cardiac Enzymes: No results for input(s): CKTOTAL, CKMB, CKMBINDEX, TROPONINI in the last 168 hours. BNP (last 3 results) No results for input(s): PROBNP in the last 8760 hours. HbA1C: No results for input(s): HGBA1C in the last 72 hours. CBG:  Recent Labs Lab 06/26/17 0010 06/26/17 0406 06/26/17 0813 06/26/17 1149 06/26/17 1730  GLUCAP 119* 124* 94 89 103*   Lipid Profile: No results for input(s): CHOL, HDL, LDLCALC, TRIG, CHOLHDL, LDLDIRECT in the last 72 hours. Thyroid Function Tests: No results for input(s): TSH, T4TOTAL, FREET4, T3FREE, THYROIDAB in the last 72 hours. Anemia Panel:  Recent Labs  06/24/17 1739  FERRITIN 680*  TIBC 106*    IRON 13*   Urine analysis:    Component Value Date/Time   COLORURINE AMBER (A) 05/15/2017 0230   APPEARANCEUR CLEAR 05/15/2017 0230   LABSPEC 1.028 05/15/2017 0230   LABSPEC 1.010 08/17/2015 1542   PHURINE 5.0 05/15/2017 0230   GLUCOSEU NEGATIVE 05/15/2017 0230   GLUCOSEU 100 08/17/2015 1542   HGBUR NEGATIVE 05/15/2017 0230   BILIRUBINUR NEGATIVE 05/15/2017 0230   BILIRUBINUR Negative 08/17/2015 1542   KETONESUR NEGATIVE 05/15/2017 0230   PROTEINUR 30 (A) 05/15/2017 0230   UROBILINOGEN 1.0 08/26/2015 0725   UROBILINOGEN 0.2 08/17/2015 1542   NITRITE NEGATIVE 05/15/2017 0230   LEUKOCYTESUR NEGATIVE 05/15/2017 0230   LEUKOCYTESUR Trace 08/17/2015 1542  Recent Results (from the past 240 hour(s))  Urine culture     Status: Abnormal (Preliminary result)   Collection Time: 06/23/17  2:30 PM  Result Value Ref Range Status   Specimen Description URINE, RANDOM  Final   Special Requests NONE  Final   Culture (A)  Final    >=100,000 COLONIES/mL ENTEROCOCCUS FAECALIS SUSCEPTIBILITIES TO FOLLOW    Report Status PENDING  Incomplete  Culture, blood (routine x 2)     Status: None (Preliminary result)   Collection Time: 06/24/17 12:50 AM  Result Value Ref Range Status   Specimen Description BLOOD RIGHT HAND  Final   Special Requests   Final    BOTTLES DRAWN AEROBIC AND ANAEROBIC Blood Culture results may not be optimal due to an inadequate volume of blood received in culture bottles   Culture NO GROWTH 2 DAYS  Final   Report Status PENDING  Incomplete  Culture, blood (routine x 2)     Status: None (Preliminary result)   Collection Time: 06/24/17  1:00 AM  Result Value Ref Range Status   Specimen Description BLOOD RIGHT HAND  Final   Special Requests   Final    BOTTLES DRAWN AEROBIC AND ANAEROBIC Blood Culture results may not be optimal due to an inadequate volume of blood received in culture bottles   Culture NO GROWTH 2 DAYS  Final   Report Status PENDING  Incomplete      Radiology Studies: No results found.   Scheduled Meds: . gabapentin  300 mg Oral QHS  . insulin aspart  0-15 Units Subcutaneous Q4H  . metoprolol tartrate  25 mg Oral BID   Continuous Infusions: . dextrose 5 % and 0.45% NaCl 1,000 mL with potassium chloride 60 mEq infusion 75 mL/hr at 06/26/17 1100  . pantoprozole (PROTONIX) infusion 8 mg/hr (06/26/17 1325)  . piperacillin-tazobactam (ZOSYN)  IV 3.375 g (06/26/17 1443)     LOS: 3 days    Time spent: [place], MD [facility]   If 7PM-7AM, please contact night-coverage [email or web address] Password [facility] 06/26/2017, 5:35 PM

## 2017-06-26 NOTE — Progress Notes (Signed)
CC:  No complaints  Subjective: No acute events. No complaints.   Objective: Vital signs in last 24 hours: Temp:  [99.7 F (37.6 C)-100.5 F (38.1 C)] 99.7 F (37.6 C) (07/24 0403) Pulse Rate:  [78-86] 86 (07/24 0403) Resp:  [17] 17 (07/24 0403) BP: (160-170)/(86-89) 170/89 (07/24 0902) SpO2:  [96 %-97 %] 97 % (07/24 0403) Last BM Date: 06/25/17  CT scan 06/23/17:  2 apparent fluid collections identified, but are difficult to assess given the lack of intravenous and oral contrast. One of these is just inferior to the liver in tracks down the right paracolic gutter. The second is in the right lower quadrant mesentery. Given the history of recent surgery both could represent intraperitoneal abscess. However, with the history of appendiceal carcinoma, pseudomyxoma peritonei is also a consideration.   Intake/Output from previous day: 07/23 0701 - 07/24 0700 In: 2237.9 [I.V.:1987.9; IV Piggyback:250] Out: 1300 [Urine:1300] Intake/Output this shift: No intake/output data recorded.  General appearance: alert, cooperative, no distress and not sure why she is here. GI: soft, wound vac in place, few BS, foley also in place  Lab Results:   Recent Labs  06/25/17 1740 06/26/17 0628  WBC 13.2* 12.1*  HGB 8.0* 7.8*  HCT 24.6* 24.3*  PLT 371 366    BMET  Recent Labs  06/24/17 1739 06/26/17 0628  NA 142 146*  K 3.5 2.4*  CL 106 109  CO2 28 28  GLUCOSE 103* 122*  BUN 14 15  CREATININE 0.77 1.09*  CALCIUM 7.1* 7.3*   PT/INR No results for input(s): LABPROT, INR in the last 72 hours.   Recent Labs Lab 06/23/17 2121  AST 18  ALT 11*  ALKPHOS 89  BILITOT 0.4  PROT 5.3*  ALBUMIN 1.3*     Lipase     Component Value Date/Time   LIPASE 31 05/14/2017 2106     Medications: . gabapentin  300 mg Oral QHS  . insulin aspart  0-15 Units Subcutaneous Q4H  . metoprolol tartrate  25 mg Oral BID   . dextrose 5 % and 0.45% NaCl 1,000 mL with potassium chloride 60 mEq  infusion    . pantoprozole (PROTONIX) infusion 8 mg/hr (06/26/17 0230)  . piperacillin-tazobactam (ZOSYN)  IV Stopped (06/26/17 0907)   Anti-infectives    Start     Dose/Rate Route Frequency Ordered Stop   06/24/17 0600  piperacillin-tazobactam (ZOSYN) IVPB 3.375 g     3.375 g 12.5 mL/hr over 240 Minutes Intravenous Every 8 hours 06/23/17 2254     06/23/17 2300  piperacillin-tazobactam (ZOSYN) IVPB 3.375 g     3.375 g 100 mL/hr over 30 Minutes Intravenous  Once 06/23/17 2254 06/24/17 0056      Assessment/Plan  Bloody bowel movements/lower GI bleed- received 3u PRBC, hgb stable x 24h Abdominal pain with intraabdominal abscess by CT 06/23/17- family holding off in IR aspiration for now while goals of care are addressed  Hx of recurrent SB obstruction due to carcinomatosis. Intra-abdominal abscess secondary to contain small bowel perforation. S/p . 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 2.  Exploratory laparotomy, drainage of abdominal abscess, repair of small bowel perforation, repair of small bowel enterotomy, enterocolostomy (mid ileum to mid transverse colon).05/30/17, Dr. Jackolyn Confer.  Findings: There is diffuse carcinomatosis with a frozen pelvis. The tumor was heavily concentrated in the right lower quadrant and distal ileum leading to an obstruction. There are multiple peritoneal nodules present as well as multiple nodules on  the small bowel in the small bowel mesentery, and sigmoid colon  Hx of IIB endocervical adenocarcinoma s/p chemoradiation Hx of appendiceal mucinous adenocarcinoma s/p appendectomy HTN Hx of cerebral aneurysm repair Hx of seizures Type II DM - diet controlled  FEN - IV fluids/NPO VTE - SCDs, No anticoagulants due to GI bleeding. ID - Zosyn 06/23/17 =>> day 4     PLan:  Vac change   Family holding off on IR drainage for now. Hopefully bleeding has resolved itself.   No indication  for surgical intervention at this time. Dr. Marin Olp and Palliative is following.  We will follow peripherally. Please call with questions or concerns at any time.     LOS: 3 days    Clovis Riley MD 06/26/2017

## 2017-06-26 NOTE — Progress Notes (Signed)
Took down patient's wound vac with PA Creig Hines, per PA wet-dry dressing now, orders to come. Will continue to monitor.

## 2017-06-26 NOTE — Progress Notes (Signed)
Spoke with MD Verlon Au and let him know that orders made to patient's IV fluids did not stick this morning so he told me to put in D5 1/2 NS with 60 meq of potassium. I also let him know patient's BP was elevated, not symptomatic, he said he would look into it. Will continue to monitor.

## 2017-06-26 NOTE — Progress Notes (Signed)
Pharmacy Antibiotic Note  Lori [name] is a 66 y.o. female admitted on 06/23/2017 with intra-abdominal infection.  Pharmacy has been consulted for zosyn dosing.  Intra-abdominal abscess vs. cancer metastasis -- empiric coverage for now. IR may aspirate or drain collections. Although family discussing goals of care and is holding off on any type of drainage currently. Recently treated for Kleb pneumo UTI. No real UTI symptoms on this admit. T-max 100.4, now afebrile  Plan: Continue Zosyn 3.375g IV q8h (4 hour infusion).  Monitor clinical picture, renal function F/U goals of care, C&S, abx deescalation / LOT   Height: 5\' 9"  (175.3 cm) Weight: 220 lb 6.4 oz (100 kg) IBW/kg (Calculated) : 66.2  Temp (24hrs), Avg:100.2 F (37.9 C), Min:99.7 F (37.6 C), Max:100.5 F (38.1 C)   Recent Labs Lab 06/23/17 2121  06/24/17 1739 06/25/17 0119 06/25/17 0939 06/25/17 1740 06/26/17 0628  WBC 10.3  < > 13.4* 13.5* 12.6* 13.2* 12.1*  CREATININE 0.65  --  0.77  --   --   --  1.09*  < > = values in this interval not displayed.  Estimated Creatinine Clearance: 63.9 mL/min (A) (by C-G formula based on SCr of 1.09 mg/dL (H)).    Allergies  Allergen Reactions  . Contrast Media [Iodinated Diagnostic Agents] Itching and Swelling  . Dilantin [Phenytoin Sodium Extended] Itching, Swelling and Other (See Comments)    Whole body  . Latex Hives and Itching  . Adhesive [Tape] Rash    Antimicrobials this admission: 7/21 Zosyn>>  Dose adjustments this admission: n/a  Microbiology results: 7/21 UCx: E. Faecalis (pending) 722 BCx: ngtd  Thank you for allowing pharmacy to be a part of this patient's care.  Reginia Naas 06/26/2017 9:45 AM

## 2017-06-26 NOTE — Progress Notes (Addendum)
No charge note:   Noted small drop in Hgb overnight.  Spoke with Lori [name] (pt son and who patient defers decision making to) via phone. Lori [name] has requested that patient's health status and information not be discussed in her presence as she has limited understanding and literacy.  Meeting arranged for tomorrow 06/27/17 at 12pm with him and Lori [name] (patient's daughter in law who is also an Therapist, sports) for follow up [place]. Plan to discuss code status with strong recommendation for DNR as well as Hospice.   Lori [name], AGNP-C Palliative Medicine  Please call Palliative Medicine team phone with any questions [phone]. For individual providers please see AMION.

## 2017-06-27 DIAGNOSIS — R569 Unspecified convulsions: Secondary | ICD-10-CM

## 2017-06-27 LAB — BPAM RBC
BLOOD PRODUCT EXPIRATION DATE: 201808092359
BLOOD PRODUCT EXPIRATION DATE: 201808212359
BLOOD PRODUCT EXPIRATION DATE: 201808212359
Blood Product Expiration Date: 201808212359
Blood Product Expiration Date: 201808212359
ISSUE DATE / TIME: 201807220247
ISSUE DATE / TIME: 201807220839
ISSUE DATE / TIME: 201807221953
UNIT TYPE AND RH: 5100
UNIT TYPE AND RH: 5100
UNIT TYPE AND RH: 5100
Unit Type and Rh: 5100
Unit Type and Rh: 5100

## 2017-06-27 LAB — TYPE AND SCREEN
ABO/RH(D): O POS
ANTIBODY SCREEN: NEGATIVE
UNIT DIVISION: 0
UNIT DIVISION: 0
UNIT DIVISION: 0
Unit division: 0
Unit division: 0

## 2017-06-27 LAB — CBC WITH DIFFERENTIAL/PLATELET
Basophils Absolute: 0 10*3/uL (ref 0.0–0.1)
Basophils Relative: 0 %
EOS ABS: 0.1 10*3/uL (ref 0.0–0.7)
EOS PCT: 1 %
HCT: 24.9 % — ABNORMAL LOW (ref 36.0–46.0)
Hemoglobin: 7.7 g/dL — ABNORMAL LOW (ref 12.0–15.0)
LYMPHS ABS: 1.1 10*3/uL (ref 0.7–4.0)
LYMPHS PCT: 9 %
MCH: 26.8 pg (ref 26.0–34.0)
MCHC: 30.9 g/dL (ref 30.0–36.0)
MCV: 86.8 fL (ref 78.0–100.0)
MONO ABS: 0.9 10*3/uL (ref 0.1–1.0)
Monocytes Relative: 7 %
Neutro Abs: 9.6 10*3/uL — ABNORMAL HIGH (ref 1.7–7.7)
Neutrophils Relative %: 83 %
PLATELETS: 421 10*3/uL — AB (ref 150–400)
RBC: 2.87 MIL/uL — ABNORMAL LOW (ref 3.87–5.11)
RDW: 16 % — AB (ref 11.5–15.5)
WBC: 11.6 10*3/uL — AB (ref 4.0–10.5)

## 2017-06-27 LAB — MAGNESIUM: MAGNESIUM: 1.5 mg/dL — AB (ref 1.7–2.4)

## 2017-06-27 LAB — URINE CULTURE: Culture: >=100000 — AB

## 2017-06-27 LAB — VITAMIN B12: Vitamin B-12: 520 pg/mL (ref 180–914)

## 2017-06-27 LAB — COMPREHENSIVE METABOLIC PANEL
ALT: 8 U/L — AB (ref 14–54)
ANION GAP: 7 (ref 5–15)
AST: 14 U/L — ABNORMAL LOW (ref 15–41)
Albumin: 1.1 g/dL — ABNORMAL LOW (ref 3.5–5.0)
Alkaline Phosphatase: 90 U/L (ref 38–126)
BUN: 11 mg/dL (ref 6–20)
CHLORIDE: 111 mmol/L (ref 101–111)
CO2: 29 mmol/L (ref 22–32)
CREATININE: 1.05 mg/dL — AB (ref 0.44–1.00)
Calcium: 7.3 mg/dL — ABNORMAL LOW (ref 8.9–10.3)
GFR, EST NON AFRICAN AMERICAN: 54 mL/min — AB (ref >60)
Glucose, Bld: 109 mg/dL — ABNORMAL HIGH (ref 65–99)
POTASSIUM: 2.6 mmol/L — AB (ref 3.5–5.1)
SODIUM: 147 mmol/L — AB (ref 135–145)
Total Bilirubin: 0.5 mg/dL (ref 0.3–1.2)
Total Protein: 5.1 g/dL — ABNORMAL LOW (ref 6.5–8.1)

## 2017-06-27 LAB — BASIC METABOLIC PANEL
Anion gap: 6 (ref 5–15)
BUN: 9 mg/dL (ref 6–20)
CALCIUM: 7.4 mg/dL — AB (ref 8.9–10.3)
CO2: 27 mmol/L (ref 22–32)
Chloride: 110 mmol/L (ref 101–111)
Creatinine, Ser: 0.98 mg/dL (ref 0.44–1.00)
GFR calc Af Amer: >60 mL/min (ref >60)
GFR, EST NON AFRICAN AMERICAN: 59 mL/min — AB (ref >60)
Glucose, Bld: 144 mg/dL — ABNORMAL HIGH (ref 65–99)
Potassium: 3.3 mmol/L — ABNORMAL LOW (ref 3.5–5.1)
SODIUM: 143 mmol/L (ref 135–145)

## 2017-06-27 LAB — GLUCOSE, CAPILLARY
GLUCOSE-CAPILLARY: 107 mg/dL — AB (ref 65–99)
GLUCOSE-CAPILLARY: 111 mg/dL — AB (ref 65–99)
GLUCOSE-CAPILLARY: 121 mg/dL — AB (ref 65–99)
GLUCOSE-CAPILLARY: 141 mg/dL — AB (ref 65–99)
GLUCOSE-CAPILLARY: 159 mg/dL — AB (ref 65–99)
Glucose-Capillary: 116 mg/dL — ABNORMAL HIGH (ref 65–99)

## 2017-06-27 LAB — HEMOGLOBIN A1C: Hgb A1c MFr Bld: <4.2 % — ABNORMAL LOW (ref 4.8–5.6)

## 2017-06-27 MED ORDER — POTASSIUM CHLORIDE CRYS ER 20 MEQ PO TBCR
40.0000 meq | EXTENDED_RELEASE_TABLET | Freq: Two times a day (BID) | ORAL | Status: DC
  Administered 2017-06-27: 40 meq via ORAL
  Filled 2017-06-27: qty 2

## 2017-06-27 MED ORDER — CARBAMAZEPINE 100 MG PO CHEW
100.0000 mg | CHEWABLE_TABLET | Freq: Three times a day (TID) | ORAL | Status: DC
  Administered 2017-06-27 – 2017-06-30 (×6): 100 mg via ORAL
  Filled 2017-06-27 (×7): qty 1

## 2017-06-27 MED ORDER — SODIUM CHLORIDE 0.9 % IV SOLN
510.0000 mg | Freq: Once | INTRAVENOUS | Status: DC
  Administered 2017-06-27: 510 mg via INTRAVENOUS
  Filled 2017-06-27: qty 17

## 2017-06-27 MED ORDER — MAGNESIUM SULFATE 2 GM/50ML IV SOLN
2.0000 g | Freq: Once | INTRAVENOUS | Status: AC
  Administered 2017-06-27: 2 g via INTRAVENOUS
  Filled 2017-06-27 (×2): qty 50

## 2017-06-27 MED ORDER — POTASSIUM CHLORIDE CRYS ER 20 MEQ PO TBCR
40.0000 meq | EXTENDED_RELEASE_TABLET | Freq: Once | ORAL | Status: AC
  Administered 2017-06-27: 40 meq via ORAL
  Filled 2017-06-27: qty 2

## 2017-06-27 MED ORDER — SODIUM CHLORIDE 0.9 % IV SOLN
510.0000 mg | Freq: Once | INTRAVENOUS | Status: AC
  Administered 2017-06-27: 510 mg via INTRAVENOUS
  Filled 2017-06-27: qty 17

## 2017-06-27 NOTE — Progress Notes (Signed)
CC:  Lower GI bleed  Subjective: No complaints this AM, I changed the dressing this AM, It should clean up with time.  Sutures are all loose.  She is not a candidate for chemotherapy per Dr. Marin Olp, or endoscopy at this point.  She will need colonscopy in the future.  She is not aware of any bleeding currently.   Objective: Vital signs in last 24 hours: Temp:  [98.1 F (36.7 C)-98.9 F (37.2 C)] 98.1 F (36.7 C) (07/25 0448) Pulse Rate:  [83-84] 83 (07/25 0448) Resp:  [17-19] 19 (07/25 0448) BP: (151-170)/(70-89) 151/79 (07/25 0448) SpO2:  [97 %] 97 % (07/25 0448) Last BM Date: 06/26/17 30 PO recorded 700 IV recorded Urine 1250 recorded BM x 2 recorded Afebrile, VSS NA 147, K+ 2.6 creatinine is stable  Prealbumin was 6.1 - 06/24/17 WBC stable H/H 7.7/24.9 Intake/Output from previous day: 07/24 0701 - 07/25 0700 In: 730 [P.O.:30; I.V.:600; IV Piggyback:100] Out: 1250 [Urine:1250] Intake/Output this shift: No intake/output data recorded.  General appearance: alert, cooperative and no distress GI: soft, denies pain on exam, Open wound is now wet to dry with gentle cleaning of the white fibrous non viable tissue with 4 x 4's.  I would not go back with the wound vac at this point.  site looks fine with wet to dry dressings.  Lab Results:   Recent Labs  06/26/17 0628 06/27/17 0422  WBC 12.1* 11.6*  HGB 7.8* 7.7*  HCT 24.3* 24.9*  PLT 366 421*    BMET  Recent Labs  06/26/17 0628 06/27/17 0422  NA 146* 147*  K 2.4* 2.6*  CL 109 111  CO2 28 29  GLUCOSE 122* 109*  BUN 15 11  CREATININE 1.09* 1.05*  CALCIUM 7.3* 7.3*   PT/INR No results for input(s): LABPROT, INR in the last 72 hours.   Recent Labs Lab 06/23/17 2121 06/27/17 0422  AST 18 14*  ALT 11* 8*  ALKPHOS 89 90  BILITOT 0.4 0.5  PROT 5.3* 5.1*  ALBUMIN 1.3* 1.1*     Lipase     Component Value Date/Time   LIPASE 31 05/14/2017 2106     Medications: . gabapentin  300 mg Oral QHS   . insulin aspart  0-15 Units Subcutaneous Q4H  . metoprolol tartrate  25 mg Oral BID  . potassium chloride  40 mEq Oral BID   . dextrose 5 % and 0.45% NaCl 1,000 mL with potassium chloride 60 mEq infusion 75 mL/hr at 06/26/17 1100  . ferumoxytol    . piperacillin-tazobactam (ZOSYN)  IV 3.375 g (06/27/17 0612)   Anti-infectives    Start     Dose/Rate Route Frequency Ordered Stop   06/24/17 0600  piperacillin-tazobactam (ZOSYN) IVPB 3.375 g     3.375 g 12.5 mL/hr over 240 Minutes Intravenous Every 8 hours 06/23/17 2254     06/23/17 2300  piperacillin-tazobactam (ZOSYN) IVPB 3.375 g     3.375 g 100 mL/hr over 30 Minutes Intravenous  Once 06/23/17 2254 06/24/17 0056      Assessment/Plan  Bloody bowel movements/lower GI bleed with ongoing decline in H/H Abdominal pain with intraabdominal abscess by CT 06/23/17  Hx of recurrent SB obstruction due to carcinomatosis. Intra-abdominal abscess secondary to contain small bowel perforation. S/p . 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 2. Exploratory laparotomy, drainage of abdominal abscess, repair of small bowel perforation, repair of small bowel enterotomy, enterocolostomy (mid ileum to mid transverse colon).05/30/17, Dr.  Jackolyn Confer.  Findings: There is diffuse carcinomatosis with a frozen pelvis. The tumor was heavily concentrated in the right lower quadrant and distal ileum leading to an obstruction. There are multiple peritoneal nodules present as well as multiple nodules on the small bowel in the small bowel mesentery, and sigmoid colon  Hx of IIB endocervical adenocarcinoma s/p chemoradiation Hx of appendiceal mucinous adenocarcinoma s/p appendectomy HTN Hx of cerebral aneurysm repair Hx of seizures Type II DM- diet controlled  FEN- IV fluids/Clears VTE- SCDs, No anticoagulants due to GI bleeding. ID- Zosyn 06/23/17 =>> day 5   Plan:  Medical  management, wet to dry dressing as ordered for the open wound.  We will be available as needed.  LOS: 4 days    Lori [name] 06/27/2017 [id number]

## 2017-06-27 NOTE — Progress Notes (Signed)
Called by Caryl Pina in lab to notify IV team of 1400 lab (call received at 1453), per Jule Economy, RN patient stated that she was not going to allow anyone else to draw blood from her today after her 3rd blood draw of the day earlier.  Notified Marena Chancy, RN bedside nurse who states that she will ask patient if she will allow [place] to draw the labs.  If patient is agreeable [place], RN will enter a consult to draw blood and IV team will draw.  If patient is not agreeable then Marena Chancy, RN will notify MD.  Carolee Rota, RN [facility]

## 2017-06-27 NOTE — Progress Notes (Signed)
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 She was at a skilled nursing facility. He was recently hospitalized for small bowel obstruction and underwent diagnostic laparoscopy in June. She underwent repair of small bowel perforation. She had a prolonged hospital stay. Subsequently was discharged to skilled nursing facility.  Reason for Visit: Rectal bleeding  Consultants: Oncology. Palliative medicine.  Procedures: None  Antibiotics: Zosyn  Subjective/Interval History: Patient denies any pain currently. Denies any nausea, vomiting.  ROS: No headaches  Objective:  Vital Signs  Vitals:   06/26/17 0902 06/26/17 1235 06/26/17 2025 06/27/17 0448  BP: (!) 170/89 (!) 170/84 (!) 160/70 (!) 151/79  Pulse:  84 84 83  Resp:  17 18 19   Temp:  98.9 F (37.2 C) 98.1 F (36.7 C) 98.1 F (36.7 C)  TempSrc:  Axillary Oral Oral  SpO2:  97% 97% 97%  Weight:      Height:        Intake/Output Summary (Last 24 hours) at 06/27/17 1419 Last data filed at 06/27/17 1321  Gross per 24 hour  Intake              970 ml  Output             1750 ml  Net             -780 ml   Filed Weights   06/23/17 2347  Weight: 100 kg (220 lb 6.4 oz)    General appearance: alert, cooperative, appears stated age and no distress Head: Normocephalic, without obvious abnormality, atraumatic Resp: clear to auscultation bilaterally Cardio: regular rate and rhythm, S1, S2 normal, no murmur, click, rub or gallop GI: Abdomen covered by dressing. Bowel sounds are sluggish but present. Tender without any rebound, rigidity or  guarding. Extremities: Edema noted bilateral lower extremities. Neurologic: Awake and alert. Somewhat distracted at times. No focal neurological deficits.  Lab Results:  Data Reviewed: I have personally reviewed following labs and imaging studies  CBC:  Recent Labs Lab 06/23/17 2121  06/25/17 0119 06/25/17 0939 06/25/17 1740 06/26/17 0628 06/27/17 0422  WBC 10.3  < > 13.5* 12.6* 13.2* 12.1* 11.6*  NEUTROABS 8.5*  --   --   --   --   --  9.6*  HGB 10.5*  < > 8.0* 8.0* 8.0* 7.8* 7.7*  HCT 34.2*  < > 24.7* 24.7* 24.6* 24.3* 24.9*  MCV 86.8  < > 84.0 84.3 84.8 85.6 86.8  PLT 434*  < > 375 363 371 366 421*  < > = values in this interval not displayed.  Basic Metabolic Panel:  Recent Labs Lab 06/23/17 2121 06/24/17 1739 06/26/17 0628 06/27/17 0422  NA 140 142 146* 147*  K 3.3* 3.5 2.4* 2.6*  CL 103 106 109 111  CO2 29 28 28 29   GLUCOSE 167* 103* 122* 109*  BUN 9 14 15 11   CREATININE 0.65 0.77 1.09* 1.05*  CALCIUM 7.3* 7.1* 7.3* 7.3*  MG  --   --   --  1.5*    GFR: Estimated Creatinine Clearance: 66.3 mL/min (A) (by C-G formula based on SCr of 1.05 mg/dL (H)).  Liver Function Tests:  Recent Labs Lab 06/23/17 2121 06/27/17 0422  AST 18 14*  ALT 11* 8*  ALKPHOS 89 90  BILITOT 0.4 0.5  PROT 5.3* 5.1*  ALBUMIN 1.3* 1.1*   CBG:  Recent Labs Lab 06/26/17 2018 06/27/17 0014 06/27/17 0442 06/27/17 0806 06/27/17 1306  GLUCAP 111* 121* 111* 107* 116*    Anemia Panel:  Recent Labs  06/24/17 1739 06/27/17 1016  VITAMINB12  --  520  FERRITIN 680*  --   TIBC 106*  --   IRON 13*  --     Recent Results (from the past 240 hour(s))  Urine culture     Status: Abnormal   Collection Time: 06/23/17  2:30 PM  Result Value Ref Range Status   Specimen Description URINE, RANDOM  Final   Special Requests NONE  Final   Culture >=100,000 COLONIES/mL ENTEROCOCCUS FAECALIS (A)  Final   Report Status 06/27/2017 FINAL  Final   Organism ID, Bacteria ENTEROCOCCUS  FAECALIS (A)  Final      Susceptibility   Enterococcus faecalis - MIC*    AMPICILLIN <=2 SENSITIVE Sensitive     LEVOFLOXACIN 1 SENSITIVE Sensitive     NITROFURANTOIN <=16 SENSITIVE Sensitive     VANCOMYCIN 2 SENSITIVE Sensitive     * >=100,000 COLONIES/mL ENTEROCOCCUS FAECALIS  Culture, blood (routine x 2)     Status: None (Preliminary result)   Collection Time: 06/24/17 12:50 AM  Result Value Ref Range Status   Specimen Description BLOOD RIGHT HAND  Final   Special Requests   Final    BOTTLES DRAWN AEROBIC AND ANAEROBIC Blood Culture results may not be optimal due to an inadequate volume of blood received in culture bottles   Culture NO GROWTH 3 DAYS  Final   Report Status PENDING  Incomplete  Culture, blood (routine x 2)     Status: None (Preliminary result)   Collection Time: 06/24/17  1:00 AM  Result Value Ref Range Status   Specimen Description BLOOD RIGHT HAND  Final   Special Requests   Final    BOTTLES DRAWN AEROBIC AND ANAEROBIC Blood Culture results may not be optimal due to an inadequate volume of blood received in culture bottles   Culture NO GROWTH 3 DAYS  Final   Report Status PENDING  Incomplete      Radiology Studies: No results found.   Medications:  Scheduled: . gabapentin  300 mg Oral QHS  . insulin aspart  0-15 Units Subcutaneous Q4H  . metoprolol tartrate  25 mg Oral BID   Continuous: . dextrose 5 % and 0.45% NaCl 1,000 mL with potassium chloride 60 mEq infusion 75 mL/hr at 06/26/17 1100  . piperacillin-tazobactam (ZOSYN)  IV 3.375 g (06/27/17 1330)   GGY:IRSW & mag hydroxide-simeth, morphine injection, ondansetron **OR** ondansetron (ZOFRAN) IV, sodium chloride flush  Assessment/Plan:  Active Problems:   Primary appendiceal adenocarcinoma (HCC)   HTN (hypertension)   Seizure ([place])   Peritoneal carcinomatosis ([place])   Intra-abdominal abscess (HCC)   Goals of care, counseling/discussion   Advance care planning   Palliative care by  specialist    Primary appendiceal carcinoma with metastatic disease, stage IV Seen by oncology. Not currently a candidate for any chemotherapy.  Rectal bleeding of unknown cause. Appears to have subsided. Continue to monitor. This was discussed with general surgery. Patient not a candidate for any endoscopic evaluation due to recent surgery.  Anemia due to acute blood loss. Monitor hemoglobin closely. Transfuse as needed.  Abdominal fluid collection noted on CT scan This was discussed with interventional radiology. All procedures are on hold  until family meets with palliative medicine.  Diabetes mellitus type 2. Continue SSI.  Severe hypokalemia with hypomagnesemia Potassium to be repleted aggressively. Repeat labs later today.  Essential hypertension. Continue metoprolol.  History of seizure disorder Was on carbamazepine, which has not been continued for now. We will resume it. Also noted to be on Neurontin.  Positive urine culture with enterococcus Unclear if she was never symptomatic. Zosyn should cover.  DVT Prophylaxis: SCDs    Code Status: Full code  Family Communication: No family at bedside  Disposition Plan: Management as outlined above. Await palliative medicine discussion with family.    LOS: 4 days   [place] [facility] Pager [phone] 06/27/2017, 2:19 PM  If 7PM-7AM, please contact night-coverage at [email or web address], password [facility]

## 2017-06-27 NOTE — Progress Notes (Signed)
Lori [name] is doing okay. She would like to have some physical therapy.  She has been transfused. She's gotten iron.  Surprise, her CEA is only 8.9. Either she has poorly differentiated carcinoma that does not make CEA or she just does not have a lot of disease.  She is not complaining of any pain.  She still not eating. Over, she will be able to eat soon.  Her labs showed white cell count of 11.6. Hemoglobin 7.7. Platelet count 4 21,000. Her albumin is only 1.1. We checked her prealbumin, it was only 6.1. Her potassium is 2.6. This has been running quite low.  I talked to her about treatment for the colon cancer. I told her that she is not a candidate for any chemotherapy. She is just not strong enough. She has a very low prealbumin level.  She still has a very strong faith that God will get her better.  I really believe that palliative care would be optimal for her. I don't believe that she will go along with that.  Clearly, quality of life is the primary goal in my point of view.  On her physical exam, there really is no change. Her blood pressure is 151/79. Temperature 98.1. Pulse 83. She has the abdominal wound that is dressed. There is no distention. Cardiac exam is regular rate and rhythm.  Again, I'm somewhat surprised by the CEA only being a 8.9 unfortunately, we don't have any prior CEA to compare..    I went to check a vitamin B-12 level. I want to make sure that we are not overlooking low vitamin B-12 as part of her anemia and weakness.  Lori Haw, MD  Psalm 106:2

## 2017-06-27 NOTE — Progress Notes (Signed)
Potassium level is 3.3. Received telephone order from MD Sharrie Rothman to place one time order for oral potassium 40 meq.Will pass onto the night nurse.

## 2017-06-27 NOTE — Progress Notes (Addendum)
Daily Progress Note   Patient Name: Lori [name]       Date: 06/27/2017 DOB: 05-Sep-1951  Age: 66 y.o. MRN#: [id number] Attending Physician: Bonnielee Haff, MD Primary Care Physician: [name], MD Admit Date: 06/23/2017  Reason for Consultation/Follow-up: Establishing goals of care  Subjective: Called Marlond Rasmusson this morning to confirm meeting with him and Seth Bake at 5 today. Seth Bake informed me that she would not be coming and that today was their daughter's birthday and instructed me not to meet and discuss anything with [name] today. Therefore 12' oclock meeting was cancelled.  I did discuss with Seth Bake concerns re: code status.  Also discuss possibility of starting octreotide to possibly help decrese GI bleeding without invasive procedure.   ROS  Length of Stay: 4   Physical Exam          Vital Signs: BP (!) 151/79 (BP Location: Right Arm)   Pulse 83   Temp 98.1 F (36.7 C) (Oral)   Resp 19   Ht 5\' 9"  (1.753 m)   Wt 100 kg (220 lb 6.4 oz)   SpO2 97%   BMI 32.55 kg/m  SpO2: SpO2: 97 % O2 Device: O2 Device: Not Delivered O2 Flow Rate: O2 Flow Rate (L/min): 2 L/min  Intake/output summary:  Intake/Output Summary (Last 24 hours) at 06/27/17 1214 Last data filed at 06/27/17 1038  Gross per 24 hour  Intake              970 ml  Output             1250 ml  Net             -280 ml   LBM: Last BM Date: 06/26/17 Baseline Weight: Weight: 100 kg (220 lb 6.4 oz) Most recent weight: Weight: 100 kg (220 lb 6.4 oz)       Palliative Care Assessment & Plan   Patient Profile:65 y.o.femalewith past medical history of DM2, seizures, HTN, HLD, GERD, intracranial aneurysm repair (1997), cervical and appendiceal ca (s/p chemotherapy and radiation tx), now with recurrent  Stage IV appendiceal adenocarcinoma found during last hospitalization at which time she underwent laparotomy for SBO with perforation and was discharged to SNF in [place] with wound VAC in place. She has not started therapy for recurrent appendiceal carcinoma yet with Dr. [name], but  is planning to. She wasadmitted on 7/21/2018with GI bleeding- noting large clots and bleeding per rectum. Workup revealed Hgb of 5.6. CT scan shows 2 fluid collections- ?abscess vs metastatic disease?   Assessment/Recommendations/Plan   Per discussion with palliative medical director- recommend starting octreotide infusion 17mcg/hr then increasing to 61mcg/hr as palliative measure to stop GI bleed- Seth Bake stated she would discuss with family and call me back regarding this  Family continues to support patient in her desire for full code  Goals of Care and Additional Recommendations:  Limitations on Scope of Treatment: Full Comfort Care  Code Status:  Full code  Prognosis:   Unable to determine  Discharge Planning:  To Be Determined  Care plan was discussed with Mackie Pai.  Thank you for allowing the Palliative Medicine Team to assist in the care of this patient.   Total time: 15 minutes  Greater than 50%  of this time was spent counseling and coordinating care related to the above assessment and plan.  Mariana Kaufman, AGNP-C Palliative Medicine   Please contact Palliative Medicine Team phone at [phone] for questions and concerns.

## 2017-06-28 ENCOUNTER — Encounter (HOSPITAL_COMMUNITY): Payer: Self-pay | Admitting: Radiology

## 2017-06-28 ENCOUNTER — Inpatient Hospital Stay (HOSPITAL_COMMUNITY): Payer: Medicare Other

## 2017-06-28 DIAGNOSIS — R4182 Altered mental status, unspecified: Secondary | ICD-10-CM

## 2017-06-28 DIAGNOSIS — D62 Acute posthemorrhagic anemia: Secondary | ICD-10-CM

## 2017-06-28 LAB — BASIC METABOLIC PANEL
ANION GAP: 6 (ref 5–15)
BUN: 7 mg/dL (ref 6–20)
CHLORIDE: 112 mmol/L — AB (ref 101–111)
CO2: 26 mmol/L (ref 22–32)
Calcium: 7.7 mg/dL — ABNORMAL LOW (ref 8.9–10.3)
Creatinine, Ser: 0.99 mg/dL (ref 0.44–1.00)
GFR calc Af Amer: >60 mL/min (ref >60)
GFR, EST NON AFRICAN AMERICAN: 58 mL/min — AB (ref >60)
GLUCOSE: 139 mg/dL — AB (ref 65–99)
POTASSIUM: 4.2 mmol/L (ref 3.5–5.1)
Sodium: 144 mmol/L (ref 135–145)

## 2017-06-28 LAB — CBC
HEMATOCRIT: 26.7 % — AB (ref 36.0–46.0)
HEMOGLOBIN: 8.3 g/dL — AB (ref 12.0–15.0)
MCH: 27.6 pg (ref 26.0–34.0)
MCHC: 31.1 g/dL (ref 30.0–36.0)
MCV: 88.7 fL (ref 78.0–100.0)
Platelets: 510 10*3/uL — ABNORMAL HIGH (ref 150–400)
RBC: 3.01 MIL/uL — ABNORMAL LOW (ref 3.87–5.11)
RDW: 16.6 % — ABNORMAL HIGH (ref 11.5–15.5)
WBC: 13.6 10*3/uL — ABNORMAL HIGH (ref 4.0–10.5)

## 2017-06-28 LAB — GLUCOSE, CAPILLARY
GLUCOSE-CAPILLARY: 128 mg/dL — AB (ref 65–99)
GLUCOSE-CAPILLARY: 149 mg/dL — AB (ref 65–99)
Glucose-Capillary: 116 mg/dL — ABNORMAL HIGH (ref 65–99)
Glucose-Capillary: 121 mg/dL — ABNORMAL HIGH (ref 65–99)
Glucose-Capillary: 160 mg/dL — ABNORMAL HIGH (ref 65–99)
Glucose-Capillary: 172 mg/dL — ABNORMAL HIGH (ref 65–99)
Glucose-Capillary: 173 mg/dL — ABNORMAL HIGH (ref 65–99)

## 2017-06-28 MED ORDER — SODIUM CHLORIDE 0.9 % IV SOLN
1000.0000 mg | Freq: Once | INTRAVENOUS | Status: AC
  Administered 2017-06-28: 1000 mg via INTRAVENOUS
  Filled 2017-06-28: qty 10

## 2017-06-28 NOTE — Progress Notes (Signed)
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 She was at a skilled nursing facility. She was recently hospitalized for small bowel obstruction and underwent diagnostic laparoscopy in June. She underwent repair of small bowel perforation. She had a prolonged hospital stay. Subsequently was discharged to skilled nursing facility.  Reason for Visit: Rectal bleeding  Consultants: Oncology. Palliative medicine.  Procedures: None  Antibiotics: Zosyn  Subjective/Interval History: Patient denies any pain. Wants to be repositioned. Denies any nausea or vomiting.  ROS: No chest pain or shortness of breath  Objective:  Vital Signs  Vitals:   06/27/17 0448 06/27/17 1421 06/27/17 2040 06/28/17 0559  BP: (!) 151/79 (!) 156/90 (!) 155/82 (!) 166/71  Pulse: 83 78 77 88  Resp: 19  19 19   Temp: 98.1 F (36.7 C) 98.4 F (36.9 C) 98 F (36.7 C) 98 F (36.7 C)  TempSrc: Oral Oral Oral Oral  SpO2: 97% 98% 98% 98%  Weight:      Height:        Intake/Output Summary (Last 24 hours) at 06/28/17 0845 Last data filed at 06/28/17 0648  Gross per 24 hour  Intake             2365 ml  Output             1550 ml  Net              815 ml   Filed Weights   06/23/17 2347  Weight: 100 kg (220 lb 6.4 oz)    General appearance: Awake, alert. Somewhat distracted. In no distress. Resp: Diminished air entry at the bases. But no crackles or wheezing. Cardio: S1, S2 is normal, regular. No S3, S4. No rubs, murmurs or bruit GI: Abdomen covered in dressing. Bowel sounds are sluggish but present. No masses or organomegaly. Mildly  tender. Extremities: Edema noted bilateral lower extremities. Neurologic: Awake and alert. Somewhat distracted at times. No focal neurological deficits.  Lab Results:  Data Reviewed: I have personally reviewed following labs and imaging studies  CBC:  Recent Labs Lab 06/23/17 2121  06/25/17 0939 06/25/17 1740 06/26/17 0628 06/27/17 0422 06/28/17 0423  WBC 10.3  < > 12.6* 13.2* 12.1* 11.6* 13.6*  NEUTROABS 8.5*  --   --   --   --  9.6*  --   HGB 10.5*  < > 8.0* 8.0* 7.8* 7.7* 8.3*  HCT 34.2*  < > 24.7* 24.6* 24.3* 24.9* 26.7*  MCV 86.8  < > 84.3 84.8 85.6 86.8 88.7  PLT 434*  < > 363 371 366 421* 510*  < > = values in this interval not displayed.  Basic Metabolic Panel:  Recent Labs Lab 06/24/17 1739 06/26/17 0628 06/27/17 0422 06/27/17 1750 06/28/17 0423  NA 142 146* 147* 143 144  K 3.5 2.4* 2.6* 3.3* 4.2  CL 106 109 111 110 112*  CO2 28 28 29 27 26   GLUCOSE 103* 122* 109* 144* 139*  BUN 14 15 11 9 7   CREATININE 0.77 1.09* 1.05* 0.98 0.99  CALCIUM 7.1* 7.3* 7.3* 7.4* 7.7*  MG  --   --  1.5*  --   --     GFR: Estimated Creatinine Clearance: 70.3 mL/min (by C-G formula  based on SCr of 0.99 mg/dL).  Liver Function Tests:  Recent Labs Lab 06/23/17 2121 06/27/17 0422  AST 18 14*  ALT 11* 8*  ALKPHOS 89 90  BILITOT 0.4 0.5  PROT 5.3* 5.1*  ALBUMIN 1.3* 1.1*   CBG:  Recent Labs Lab 06/27/17 1606 06/27/17 2006 06/28/17 0003 06/28/17 0402 06/28/17 0737  GLUCAP 141* 159* 116* 128* 121*    Anemia Panel:  Recent Labs  06/27/17 1016  VITAMINB12 520    Recent Results (from the past 240 hour(s))  Urine culture     Status: Abnormal   Collection Time: 06/23/17  2:30 PM  Result Value Ref Range Status   Specimen Description URINE, RANDOM  Final   Special Requests NONE  Final   Culture >=100,000 COLONIES/mL ENTEROCOCCUS FAECALIS (A)  Final   Report Status 06/27/2017 FINAL  Final   Organism ID, Bacteria ENTEROCOCCUS FAECALIS (A)  Final       Susceptibility   Enterococcus faecalis - MIC*    AMPICILLIN <=2 SENSITIVE Sensitive     LEVOFLOXACIN 1 SENSITIVE Sensitive     NITROFURANTOIN <=16 SENSITIVE Sensitive     VANCOMYCIN 2 SENSITIVE Sensitive     * >=100,000 COLONIES/mL ENTEROCOCCUS FAECALIS  Culture, blood (routine x 2)     Status: None (Preliminary result)   Collection Time: 06/24/17 12:50 AM  Result Value Ref Range Status   Specimen Description BLOOD RIGHT HAND  Final   Special Requests   Final    BOTTLES DRAWN AEROBIC AND ANAEROBIC Blood Culture results may not be optimal due to an inadequate volume of blood received in culture bottles   Culture NO GROWTH 3 DAYS  Final   Report Status PENDING  Incomplete  Culture, blood (routine x 2)     Status: None (Preliminary result)   Collection Time: 06/24/17  1:00 AM  Result Value Ref Range Status   Specimen Description BLOOD RIGHT HAND  Final   Special Requests   Final    BOTTLES DRAWN AEROBIC AND ANAEROBIC Blood Culture results may not be optimal due to an inadequate volume of blood received in culture bottles   Culture NO GROWTH 3 DAYS  Final   Report Status PENDING  Incomplete      Radiology Studies: No results found.   Medications:  Scheduled: . carbamazepine  100 mg Oral TID  . gabapentin  300 mg Oral QHS  . insulin aspart  0-15 Units Subcutaneous Q4H  . metoprolol tartrate  25 mg Oral BID   Continuous: . dextrose 5 % and 0.45% NaCl 1,000 mL with potassium chloride 60 mEq infusion 75 mL/hr at 06/28/17 0648  . piperacillin-tazobactam (ZOSYN)  IV 3.375 g (06/28/17 0558)   WLN:LGXQ & mag hydroxide-simeth, morphine injection, ondansetron **OR** ondansetron (ZOFRAN) IV, sodium chloride flush  Assessment/Plan:  Active Problems:   Primary appendiceal adenocarcinoma (HCC)   HTN (hypertension)   Seizure ([place])   Peritoneal carcinomatosis ([place])   Intra-abdominal abscess (HCC)   Goals of care, counseling/discussion   Advance care planning   Palliative care by  specialist    Primary appendiceal carcinoma with metastatic disease, stage IV Seen by oncology. Not currently a candidate for any chemotherapy.  Rectal bleeding of unknown cause. Appears to have subsided. Continue to monitor. This was discussed with general surgery. Patient not a candidate for any endoscopic evaluation due to recent surgery.  Anemia due to acute blood loss. Monitor hemoglobin closely. Transfuse as needed. Implement appears to be stable.  Abdominal fluid collection noted on  CT scan This was discussed with interventional radiology. All procedures are on hold until family meets with palliative medicine. Palliative medicine was supposed to meet with family yesterday, but family did not show up. Palliative medicine continues to follow. Appreciate assistance.  Diabetes mellitus type 2. Continue SSI.  Severe hypokalemia with hypomagnesemia Potassium was repleted aggressively. Normal today. Patient was also given magnesium. Recheck levels tomorrow.  Essential hypertension. Continue metoprolol.  History of seizure disorder Was on carbamazepine, which has not been continued for now. We will resume it. Also noted to be on Neurontin.  Positive urine culture with enterococcus Unclear if she was never symptomatic. Zosyn should cover.  DVT Prophylaxis: SCDs    Code Status: Full code  Family Communication: No family at bedside  Disposition Plan: Management as outlined above. Await palliative medicine discussion with family.    LOS: 5 days   [place] [facility] Pager [phone] 06/28/2017, 8:45 AM  If 7PM-7AM, please contact night-coverage at [email or web address], password [facility]

## 2017-06-28 NOTE — Progress Notes (Signed)
Routine EEG completed, results pending. 

## 2017-06-28 NOTE — Procedures (Signed)
Date of recording 06/28/2017  Referring physician Solon Augusta  Reason for the study 66 year old female with history of seizure disorder, prior history of aneurysmal bleed, with decreased mental status facial droop and garbled speech  Technical This is a multichannel digital EEG recording using 10-20 electrode system  Description of the recording Posterior dominant rhythm is 4-5 Hz symmetrical   There is a mild EEG reactivity with painful stimuli. Epileptiform features were not seen during this recording.  Impression EEG is abnormal and findings are suggestive of generalized cerebral dysfunction. Epileptiform features were not seen during this recording

## 2017-06-28 NOTE — Consult Note (Signed)
Requesting Physician: Dr. Maryland Pink    Chief Complaint: Facial droop, garbled speech - Code Stroke  History obtained from:  Chart    HPI:                                                                                                                                      Lori [name] is an 66 y.o. female with a past medical history of seizure (unmedicated currently but used to be treated with tegratol), hemorrhagic aneurysm, cervical and metastatic appendiceal carcinoma, HTN, HLD, type two diabetes who presented to [facility] following two large bloody bowel movements at her SNF. She is three weeks s/p abdominal surgery with a large abdominal wound remaining. While in the ED, "CT scan of the abdomen showed 2 fluid collections, one near the liver and the second in the RLQ mesentery.  These could also be foci of mucinous metastasis" versus abscesses. She was subsequently placed on Zosyn for this and enterococcus positive UTI.   At 1348 on 7/26, a code stroke was called on Ms. Sand when the nurse noted increased lethargy, garbled speech and a facial droop. No seizure activity was witnessed, no sedating medications given today.  Pertinent Imaging: CT head 7/26: No acute abnormality. 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 MRI pending EEG pending  Pertinent Medications: Zosyn: 3.375g q8h Tegretol: 100mg  TID Gabapentin 300mg  ghs  Date last known well: Date: 06/28/2017 Time last known well: Time: 12:00  tPA Given: No: Seizure  Modified Rankin Score: 3  Stroke Risk Factors - diabetes mellitus, hypercoagulable state, hyperlipidemia and hypertension   Past Medical History:  Diagnosis Date  . Anemia 2017   3u blood 2017  . Arthritis    KNEES  . Carcinoma of appendix ([place]) 04/11/2016   Patient with a history of cervical cancer status post chemoradiation with a persistent finding of a dilated appendix with hypermetabolism. This is raised a concern of malignancy. We'll plan  to move forward with laparoscopic possible open appendectomy to rule out malignancy.   . Cervical cancer, FIGO stage IIB ([place]) 05/24/2015  . 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 Depression   . Endometrial carcinoma [facility])    goes to [facility] in [place] for chemotherapy  . GERD (gastroesophageal reflux disease)   . Heart murmur   . History of blood transfusion   . 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 History of radiation therapy 07/28/15-08/26/15   cervical region 27.5 gray  . History of seizures    post-op craniotomy for aneurysm 1997-- controlled w/ medication since then no seizures  . Hyperlipidemia   . Hypertension   . PMB (postmenopausal bleeding)   . Pulmonary nodule 03/26/2017  .  Seizures ([place])    hx of years ago   . Type 2 diabetes, diet controlled ([place])    diet controlled     Past Surgical History:  Procedure Laterality Date  . COLONOSCOPY    . CRANIOTOMY POSTERIOR FOSSA  1997   Repair aneurysm  x2  left side  . LAPAROSCOPIC APPENDECTOMY N/A 04/19/2016   Procedure: APPENDECTOMY LAPAROSCOPIC;  Surgeon: Hall Busing, MD;  Location: [facility];  Service: General;  Laterality: N/A;  . LAPAROSCOPY N/A 05/30/2017   Procedure: LAPAROSCOPY DIAGNOSTIC WITH PERITONEAL BIOPSY;  Surgeon: Jackolyn Confer, MD;  Location: [facility];  Service: General;  Laterality: N/A;  . LAPAROTOMY  05/30/2017   Procedure: EXPLORATORY LAPAROTOMY REPAIR WITH DRAINAGE OF INTRA ABDOMINAL ABSCESS OF SMALL BOWEL PERFORATION WITH ENTEROCOLOSTOMY;  Surgeon: Jackolyn Confer, MD;  Location: [facility];  Service: General;;  . PORT-A-CATH PLACEMENT  06/  2016  . TANDEM RING INSERTION N/A 07/28/2015   Procedure: TANDEM RING PLACEMENT;  Surgeon: Gery Pray, MD;  Location: [facility];   Service: Urology;  Laterality: N/A;  . TANDEM RING INSERTION N/A 08/05/2015   Procedure: TANDEM RING PLACEMENT ;  Surgeon: Gery Pray, MD;  Location: [facility];  Service: Urology;  Laterality: N/A;  . TANDEM RING INSERTION N/A 08/11/2015   Procedure: TANDEM RING PLACEMENT ;  Surgeon: Gery Pray, MD;  Location: [facility];  Service: Urology;  Laterality: N/A;  . TANDEM RING INSERTION N/A 08/17/2015   Procedure: TANDEM RING PLACEMENT ;  Surgeon: Gery Pray, MD;  Location[facility];  Service: Urology;  Laterality: N/A;  . TANDEM RING INSERTION N/A 08/26/2015   Procedure: TANDEM RING PLACEMENT ;  Surgeon: Gery Pray, MD;  Location: [facility];  Service: Urology;  Laterality: N/A;    Family History  Problem Relation Age of Onset  . Throat cancer Father   . Cervical cancer Sister      reports that she quit smoking about 21 years ago. Her smoking use included Cigarettes. She has a 1.25 pack-year smoking history. She has never used smokeless tobacco. She reports that she does not drink alcohol or use drugs.  Allergies  Allergen Reactions  . Contrast Media [Iodinated Diagnostic Agents] Itching and Swelling  . Dilantin [Phenytoin Sodium Extended] Itching, Swelling and Other (See Comments)    Whole body  . Latex Hives and Itching  . Adhesive [Tape] Rash    Medications:                                                                                                                       Current Meds  Medication Sig  . acetaminophen (TYLENOL) 325 MG tablet Take 650 mg by mouth every 6 (six) hours as needed (pain).  Marland Kitchen alum & mag hydroxide-simeth (MAALOX/MYLANTA) 200-200-20 MG/5ML suspension Take 30 mLs by mouth every 6 (six) hours as needed for indigestion or heartburn.  Marland Kitchen aspirin EC 81 MG tablet Take 81 mg by mouth daily.  Marland Kitchen  calcium carbonate (TUMS - DOSED IN MG ELEMENTAL CALCIUM) 500 MG chewable tablet Chew 1 tablet by mouth 2  (two) times daily.  . cefUROXime (CEFTIN) 500 MG tablet Take 500 mg by mouth See admin instructions. Take 1 tablet (500 mg) by mouth twice daily for UTI - 7 day course started 06/22/17 am  . cholecalciferol (VITAMIN D) 1000 units tablet Take 1,000 Units by mouth daily.  . cholecalciferol (VITAMIN D) 400 units TABS tablet Take 400 Units by mouth 2 (two) times daily.  . famotidine (PEPCID) 20 MG tablet Take 1 tablet (20 mg total) by mouth daily.  . insulin aspart (NOVOLOG) 100 UNIT/ML injection Inject 0-20 Units into the skin every 4 (four) hours. Glucose 150-200 give 2 units, 201-250 give 4 units, 251-300 give 6 units, 301-350 give 8 units, 351 or greater give 10 units. (Patient taking differently: Inject 0-10 Units into the skin See admin instructions. Inject 0-10 units twice daily (7:30am and 8:30pm) per sliding scale: CBG 0-149 0 units, 150-200 2 units, 201-250 4 units, 251-300 6 units, 301-350 8 units, 351-450 10 units)  . magnesium oxide (MAG-OX) 400 (241.3 Mg) MG tablet Take 400 mg by mouth daily.   . metoprolol tartrate (LOPRESSOR) 25 MG tablet Take 25 mg by mouth 2 (two) times daily.  . Multiple Vitamin (MULTIVITAMIN WITH MINERALS) TABS tablet Take 1 tablet by mouth at bedtime.  . potassium chloride SA (K-DUR,KLOR-CON) 20 MEQ tablet Take 20 mEq by mouth daily.   . pravastatin (PRAVACHOL) 20 MG tablet Take 20 mg by mouth at bedtime.   . vitamin C (ASCORBIC ACID) 500 MG tablet Take 500 mg by mouth See admin instructions. Take 1 tablet (500 mg) by mouth daily for 28 days for wound healing - stop date 07/19/17  . zinc gluconate 50 MG tablet Take 50 mg by mouth See admin instructions. Take 1 tablet (50 mg) by mouth daily for 14 days for wound healing - stop date 07/05/17    Review Of Systems:                                                                                                           History obtained from unobtainable from patient due to mental status  Blood pressure (!) 166/71, pulse  88, temperature 98 F (36.7 C), temperature source Oral, resp. rate 19, height 5\' 9"  (1.753 m), weight 100 kg (220 lb 6.4 oz), SpO2 98 %.   Physical Examination:                                                                                                      General: WD obese  female.  HEENT:  Normocephalic, no lesions, without obvious abnormality.  Normal external eye and conjunctiva.  Normal external ears. Normal external nose, mucus membranes and septum. Cardiovascular: S1, S2 normal, pulses palpable throughout   Pulmonary: Reduced air entry  Abdomen: Dressed Extremities: no joint deformities, effusion, or inflammation Musculoskeletal: no joint deformity or swelling Skin: warm and dry, no hyperpigmentation, vitiligo, or suspicious lesions  Neurological Examination:                                                                                               Mental Status: Cherae Marton is responsive to pain and aggressive external stimuli. Speech is severely dysarthric - unsure of aphasia. Oriented to hospital and self. Cranial Nerves: II: Blinks to threat bilaterally, pupils are equal, round. III,IV, VI: As above V,VII: Left facial droop VIII: Hearing grossly intact to loud voice IX,X: Unable to assess XI: Moves head in the lateral plane spontaneously XII: Unable to assess Motor: Moves BUE to noxious stimuli, does not move BLE to noxious stimuli but responds verbally and reaches with right hand Right :     Upper extremity   2/5   Left:     Upper extremity   2/5          Lower extremity   0/5    Lower extremity   0/5 Sensory: As above Cerebellar: She does not perform Gait: Deferred  Lab Results: Basic Metabolic Panel:  Recent Labs Lab 06/24/17 1739 06/26/17 0628 06/27/17 0422 06/27/17 1750 06/28/17 0423  NA 142 146* 147* 143 144  K 3.5 2.4* 2.6* 3.3* 4.2  CL 106 109 111 110 112*  CO2 28 28 29 27 26   GLUCOSE 103* 122* 109* 144* 139*  BUN 14 15 11 9 7   CREATININE  0.77 1.09* 1.05* 0.98 0.99  CALCIUM 7.1* 7.3* 7.3* 7.4* 7.7*  MG  --   --  1.5*  --   --     Liver Function Tests:  Recent Labs Lab 06/23/17 2121 06/27/17 0422  AST 18 14*  ALT 11* 8*  ALKPHOS 89 90  BILITOT 0.4 0.5  PROT 5.3* 5.1*  ALBUMIN 1.3* 1.1*   No results for input(s): LIPASE, AMYLASE in the last 168 hours. No results for input(s): AMMONIA in the last 168 hours.  CBC:  Recent Labs Lab 06/23/17 2121  06/25/17 0939 06/25/17 1740 06/26/17 0628 06/27/17 0422 06/28/17 0423  WBC 10.3  < > 12.6* 13.2* 12.1* 11.6* 13.6*  NEUTROABS 8.5*  --   --   --   --  9.6*  --   HGB 10.5*  < > 8.0* 8.0* 7.8* 7.7* 8.3*  HCT 34.2*  < > 24.7* 24.6* 24.3* 24.9* 26.7*  MCV 86.8  < > 84.3 84.8 85.6 86.8 88.7  PLT 434*  < > 363 371 366 421* 510*  < > = values in this interval not displayed.  Cardiac Enzymes: No results for input(s): CKTOTAL, CKMB, CKMBINDEX, TROPONINI in the last 168 hours.  Lipid Panel: No results for input(s): CHOL, TRIG, HDL, CHOLHDL, VLDL, LDLCALC in the last 168 hours.  CBG:  Recent  Labs Lab 06/27/17 2006 06/28/17 0003 06/28/17 0402 06/28/17 0737 06/28/17 1203  GLUCAP 159* 116* 128* 121* 173*    Microbiology: Results for orders placed or performed during the hospital encounter of 06/23/17  Urine culture     Status: Abnormal   Collection Time: 06/23/17  2:30 PM  Result Value Ref Range Status   Specimen Description URINE, RANDOM  Final   Special Requests NONE  Final   Culture >=100,000 COLONIES/mL ENTEROCOCCUS FAECALIS (A)  Final   Report Status 06/27/2017 FINAL  Final   Organism ID, Bacteria ENTEROCOCCUS FAECALIS (A)  Final      Susceptibility   Enterococcus faecalis - MIC*    AMPICILLIN <=2 SENSITIVE Sensitive     LEVOFLOXACIN 1 SENSITIVE Sensitive     NITROFURANTOIN <=16 SENSITIVE Sensitive     VANCOMYCIN 2 SENSITIVE Sensitive     * >=100,000 COLONIES/mL ENTEROCOCCUS FAECALIS  Culture, blood (routine x 2)     Status: None (Preliminary  result)   Collection Time: 06/24/17 12:50 AM  Result Value Ref Range Status   Specimen Description BLOOD RIGHT HAND  Final   Special Requests   Final    BOTTLES DRAWN AEROBIC AND ANAEROBIC Blood Culture results may not be optimal due to an inadequate volume of blood received in culture bottles   Culture NO GROWTH 4 DAYS  Final   Report Status PENDING  Incomplete  Culture, blood (routine x 2)     Status: None (Preliminary result)   Collection Time: 06/24/17  1:00 AM  Result Value Ref Range Status   Specimen Description BLOOD RIGHT HAND  Final   Special Requests   Final    BOTTLES DRAWN AEROBIC AND ANAEROBIC Blood Culture results may not be optimal due to an inadequate volume of blood received in culture bottles   Culture NO GROWTH 4 DAYS  Final   Report Status PENDING  Incomplete    Coagulation Studies: No results for input(s): LABPROT, INR in the last 72 hours.  Imaging: No results found.   Assessment 66 year old female with a complicated medical history including hemorrhagic aneurysms and seizure disorder (unmedicated) presented with bloody bowel movements and intra-abdominal fluid collections questionable for mets vs abscesses. Ms. Squyres was placed on Zosyn subsequently. For seizure ppx, Ms. Brooke Pace was she was also placed on tegretol 300mg  TID; however, as of 7/26, this medication has not been started. CT head showed encephalomalacia and right frontoparietal calcification. A lack of seizure ppx coupled with the fact that she is on Zosyn - which is known to reduce seizure threshold - in the setting of a florid infection with CT findings drives me to favor the episode that Ms. Stump experienced is seizure over stroke. ?Calcification as seizure nidus.  Seizure R/O Stroke Toxic metabolic encephalopathy  Plan: 1) Begin tegretol 100mg  TID, continue neurontin 2) 1000mg  Keppra x1 3) EEG when able 4) MRI when able 5) Correction of metabolic derangements per primary team 6)  We will continue to follow 7) D/C Zosyn  Lupita Raider PA-C [facility] [phone]  06/28/2017, 2:55 PM   NEUROHOSPITALIST ADDENDUM Pt. Seen and examined as code stroke. Please see documentation above which I have reviewed and edited as necessary. Plan was relayed to Dr. Maryland Pink in person. We will follow the patient with you.  Amie Portland, MD [facility] [phone]  If 7pm to 7am, please call on call as listed on AMION.

## 2017-06-28 NOTE — Significant Event (Signed)
Rapid Response Event Note  Overview:  Called by RN for patient with increased lethargy and facial droop Time Called: 1348 Arrival Time: 3009 Event Type: Neurologic  Initial Focused Assessment:  Called by RN for increased lethargy and facial droop.  On my arrival to patients room, RN at bedside.  As per RN LSN 1200 was alert, at 1340 NT came into room to check on patient and found her unresponsive with a facial droop SBP 158, HR 90's sats were in the 70's on room air and placed on nasal cannula 4 lpm sats increased to 97%.     Interventions:  Patient with facial droop, responds to pain, speech is garbled, unable to get her to move her lower extremities however sensation appears to be in tact.  Has a seizure history, however as per RN no seizure activity noted.  Code stroke called as per protocol @1403 .  MD called and at bedside.  Neuro team at bedside.  Patient transported to CT via bed with monitor and oxygen.  Code stroke cancelled  Plan of Care (if not transferred):   RN to monitor and call if assistance needed  Event Summary:   at      at          [facility], [place]

## 2017-06-28 NOTE — Progress Notes (Signed)
PT Cancellation Note  Patient Details Name: Lori [name] MRN: [id number] DOB: [birth year]   Cancelled Treatment:    Reason Eval/Treat Not Completed: Medical issues which prohibited therapy. On arrival to pt's room, nurse present and calling rapid response due to suspected stroke.   Benjiman Core, PTA Pager [phone] Acute Rehab   Allena Katz 06/28/2017, 2:07 PM

## 2017-06-28 NOTE — Progress Notes (Addendum)
Called by patient's nurse with concerns about change in patient's mental status.  Apparently, patient was found by the nursing tech to be drenched in sweat. She was not responding appropriately. She was found to be flaccid, especially on the right side. Her speech was garbled. Patient did not receive any kind of sedative medications recently. No seizure type activity was witnessed.  Patient seen at bedside. Patient definitely will garbled speech. He is able to squeeze my fingers with both her hands. Does move her left arm more than the right. Does not move either of the lower extremities. Code stroke was called.  Vital signs reported to be stable except for an oxygen saturation in the 60s, which improved with oxygen to the 90s.  Diminished air entry at the moment. S1, S2 is normal, regular. Facial asymmetry is noted with facial droop Seems to move her left upper extremity more than the right.  Neurology at bedside. Patient to go down for stat CT scan of head.  This could be acute stroke. Patient could've had a seizure as well. Await CT scan of the head. Patient's comorbidities. Discussed with neurology.  Also contacted patient's family, her daughter-in-law Seth Bake. She will arrive soon to the hospital.  Erynne Kealey 2:30 PM   CT scan shows an area of encephalomalacia with calcification. Patient's mental status started improving some. Neurology feels that this might have been a seizure rather than a stroke. She will be loaded with Keppra. She'll be monitored closely.  Family arrived at bedside. They were updated. Palliative medicine to meet with family tomorrow.  Critical care time: 35 mins  Ceyda Peterka 3:11 PM

## 2017-06-29 DIAGNOSIS — D6859 Other primary thrombophilia: Secondary | ICD-10-CM

## 2017-06-29 DIAGNOSIS — G9341 Metabolic encephalopathy: Secondary | ICD-10-CM

## 2017-06-29 LAB — GLUCOSE, CAPILLARY
GLUCOSE-CAPILLARY: 109 mg/dL — AB (ref 65–99)
GLUCOSE-CAPILLARY: 134 mg/dL — AB (ref 65–99)
GLUCOSE-CAPILLARY: 142 mg/dL — AB (ref 65–99)
GLUCOSE-CAPILLARY: 142 mg/dL — AB (ref 65–99)
Glucose-Capillary: 114 mg/dL — ABNORMAL HIGH (ref 65–99)
Glucose-Capillary: 127 mg/dL — ABNORMAL HIGH (ref 65–99)

## 2017-06-29 LAB — BASIC METABOLIC PANEL
ANION GAP: 4 — AB (ref 5–15)
BUN: 6 mg/dL (ref 6–20)
CHLORIDE: 111 mmol/L (ref 101–111)
CO2: 29 mmol/L (ref 22–32)
Calcium: 8.1 mg/dL — ABNORMAL LOW (ref 8.9–10.3)
Creatinine, Ser: 0.88 mg/dL (ref 0.44–1.00)
GFR calc Af Amer: >60 mL/min (ref >60)
GLUCOSE: 120 mg/dL — AB (ref 65–99)
POTASSIUM: 4.9 mmol/L (ref 3.5–5.1)
Sodium: 144 mmol/L (ref 135–145)

## 2017-06-29 LAB — CULTURE, BLOOD (ROUTINE X 2)
CULTURE: NO GROWTH
Culture: NO GROWTH

## 2017-06-29 LAB — MAGNESIUM: Magnesium: 1.7 mg/dL (ref 1.7–2.4)

## 2017-06-29 LAB — CBC
HEMATOCRIT: 29.5 % — AB (ref 36.0–46.0)
HEMOGLOBIN: 8.5 g/dL — AB (ref 12.0–15.0)
MCH: 26.8 pg (ref 26.0–34.0)
MCHC: 28.8 g/dL — AB (ref 30.0–36.0)
MCV: 93.1 fL (ref 78.0–100.0)
Platelets: 586 10*3/uL — ABNORMAL HIGH (ref 150–400)
RBC: 3.17 MIL/uL — ABNORMAL LOW (ref 3.87–5.11)
RDW: 16.5 % — ABNORMAL HIGH (ref 11.5–15.5)
WBC: 15.9 10*3/uL — ABNORMAL HIGH (ref 4.0–10.5)

## 2017-06-29 MED ORDER — METRONIDAZOLE IN NACL 5-0.79 MG/ML-% IV SOLN
500.0000 mg | Freq: Three times a day (TID) | INTRAVENOUS | Status: DC
  Administered 2017-06-29 – 2017-06-30 (×3): 500 mg via INTRAVENOUS
  Filled 2017-06-29 (×4): qty 100

## 2017-06-29 MED ORDER — SODIUM CHLORIDE 0.9 % IV SOLN
500.0000 mg | Freq: Two times a day (BID) | INTRAVENOUS | Status: DC
  Administered 2017-06-29: 500 mg via INTRAVENOUS
  Filled 2017-06-29 (×2): qty 5

## 2017-06-29 MED ORDER — DEXTROSE 5 % IV SOLN
2.0000 g | Freq: Three times a day (TID) | INTRAVENOUS | Status: DC
  Filled 2017-06-29: qty 2

## 2017-06-29 MED ORDER — SODIUM CHLORIDE 0.9 % IV SOLN
1000.0000 mg | Freq: Two times a day (BID) | INTRAVENOUS | Status: DC
  Administered 2017-06-30 – 2017-07-01 (×3): 1000 mg via INTRAVENOUS
  Filled 2017-06-29 (×5): qty 10

## 2017-06-29 MED ORDER — DEXTROSE 5 % IV SOLN
2.0000 g | Freq: Three times a day (TID) | INTRAVENOUS | Status: DC
  Administered 2017-06-29 – 2017-07-03 (×12): 2 g via INTRAVENOUS
  Filled 2017-06-29 (×15): qty 2

## 2017-06-29 NOTE — Progress Notes (Signed)
Pharmacy Antibiotic Note  Lori [name] is a 66 y.o. female admitted on 06/23/2017 with suspected intra-abdominal infection.  Pharmacy has been consulted for Cefepime dosing. Patient has been on Zosyn dosed per pharmacy consult but WBC continues to rise and Dr. Maryland Pink is changing antibiotics today to Cefepime + Flagyl. Patient temperatures are running low at 97.4. SCr is improving at 0.88 with normalized CrCl ~80-85 ml/min.  Last Zosyn dose was at 0600.   Plan: Cefepime 2g IV every 8 hours. Continue Flagyl 500mg  IV every 8 hours per MD.  Monitor renal function, clinical status, and culture results.   Height: 5\' 9"  (175.3 cm) Weight: 220 lb 6.4 oz (100 kg) IBW/kg (Calculated) : 66.2  Temp (24hrs), Avg:97.4 F (36.3 C), Min:97.3 F (36.3 C), Max:97.6 F (36.4 C)   Recent Labs Lab 06/25/17 1740 06/26/17 0628 06/27/17 0422 06/27/17 1750 06/28/17 0423 06/29/17 0420  WBC 13.2* 12.1* 11.6*  --  13.6* 15.9*  CREATININE  --  1.09* 1.05* 0.98 0.99 0.88    Estimated Creatinine Clearance: 79.1 mL/min (by C-G formula based on SCr of 0.88 mg/dL).    Allergies  Allergen Reactions  . Contrast Media [Iodinated Diagnostic Agents] Itching and Swelling  . Dilantin [Phenytoin Sodium Extended] Itching, Swelling and Other (See Comments)    Whole body  . Latex Hives and Itching  . Adhesive [Tape] Rash    Antimicrobials this admission: Zosyn 7/22 >>7/26 Cefepime 7/26 >> Flagyl 7/26 >>  Dose adjustments this admission:  Microbiology results: 7/21 UCx: E. Faecalis (Pan Senstive) but w/out sxs 722 BCx: ngtd x4 days  Thank you for allowing pharmacy to be a part of this patient's care.  Sloan Leiter, PharmD, BCPS Clinical Pharmacist Clinical Phone 06/29/2017 until 3:30 PM - [phone] After hours, please call #[phone] 06/29/2017 8:53 AM

## 2017-06-29 NOTE — Progress Notes (Signed)
Unfortunately, [name] appear to have some neurological event yesterday. She underwent MRI of the brain which did not show any obvious acute event. She had an EEG that was done.  I think she clearly is "declaring herself" as to her never receiving any chemotherapy for this metastatic appendiceal adenocarcinoma. I have to believe that in some way, this malignancy is a factor. I'm sure that she is hypercoagulable from this appendiceal adenocarcinoma. Even though there is no obvious embolic event, I would have to think that her blood is "thick" and that she is at risk for a cerebrovascular event. She really cannot be on anticoagulation because of her GI bleeding.  She was arousable this morning.  She is on Tegretol and Keppra.  Her labs are about the same. Her white cell count is 15.9. Hemoglobin 8.5. Platelet count 586,000. Her creatinine is 0.88.  She is growing some enterococcus in her urine.  There really is no obvious changes on her physical exam. Her temperature is 97.4. Pulse is 84. Blood pressure 139/81. Her lungs sound relatively clear bilaterally. Cardiac exam regular rate and rhythm. Abdomen is soft. There is decreased bowel sounds. She has an abdominal dressing. Neurological exam shows that she really does not move her lower legs.  At this point, I really believe that given the change in her status, that any intervention with regards to her cancer will not be undertaken. She is in no condition at all to handle any type of therapy. She has a condition that is not curable. As such, any treatment would have significant oxacillin the would further reduce her quality of life.  At this point, we will sign off. There just is not much for [place] to do. She is very nice. I just feel bad that this event happened. I suppose that it should not be too which of a surprise given that she came in in pretty tough shape. She cannot be anticoagulated because of the GI bleeding issues.  It would not surprise  me if she has marantic endocarditis secondary to her underlying malignancy. I'm not sure if an echocardiogram would help. It would not impact her treatment or her prognosis however.  Lori Haw, MD  Acts 16:31

## 2017-06-29 NOTE — Evaluation (Signed)
Clinical/Bedside Swallow Evaluation Patient Details  Name: Lori [name] MRN: [id number] Date of Birth: 1951-07-22  Today's Date: 06/29/2017 Time: SLP Start Time (ACUTE ONLY): 1553 SLP Stop Time (ACUTE ONLY): 1611 SLP Time Calculation (min) (ACUTE ONLY): 18 min  Past Medical History:  Past Medical History:  Diagnosis Date  . Anemia 2017   3u blood 2017  . Arthritis    KNEES  . 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 This is raised a concern of malignancy. We'll plan to move forward with laparoscopic possible open appendectomy to rule out malignancy.   . Cervical cancer, FIGO stage IIB ([place]) 05/24/2015  . 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 Depression   . Endometrial carcinoma [facility])    goes to [facility] in [place] for chemotherapy  . GERD (gastroesophageal reflux disease)   . Heart murmur   . History of blood transfusion   . 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 History of radiation therapy 07/28/15-08/26/15   cervical region 27.5 gray  . History of seizures    post-op craniotomy for aneurysm 1997-- controlled w/ medication since then no seizures  . Hyperlipidemia   . Hypertension   . PMB (postmenopausal bleeding)   . Pulmonary nodule 03/26/2017  . Seizures ([place])    hx of years ago   . Type 2 diabetes, diet controlled ([place])    diet controlled    Past Surgical History:  Past Surgical History:  Procedure Laterality Date  . COLONOSCOPY    . CRANIOTOMY POSTERIOR FOSSA  1997   Repair aneurysm  x2  left side  . LAPAROSCOPIC APPENDECTOMY N/A 04/19/2016   Procedure: APPENDECTOMY LAPAROSCOPIC;  Surgeon: Hall Busing,  MD;  Location: [facility];  Service: General;  Laterality: N/A;  . LAPAROSCOPY N/A 05/30/2017   Procedure: LAPAROSCOPY DIAGNOSTIC WITH PERITONEAL BIOPSY;  Surgeon: Jackolyn Confer, MD;  Location: [facility];  Service: General;  Laterality: N/A;  . LAPAROTOMY  05/30/2017   Procedure: EXPLORATORY LAPAROTOMY REPAIR WITH DRAINAGE OF INTRA ABDOMINAL ABSCESS OF SMALL BOWEL PERFORATION WITH ENTEROCOLOSTOMY;  Surgeon: Jackolyn Confer, MD;  Location: [facility];  Service: General;;  . PORT-A-CATH PLACEMENT  06/  2016  . TANDEM RING INSERTION N/A 07/28/2015   Procedure: TANDEM RING PLACEMENT;  Surgeon: Gery Pray, MD;  Location: [facility];  Service: Urology;  Laterality: N/A;  . TANDEM RING INSERTION N/A 08/05/2015   Procedure: TANDEM RING PLACEMENT ;  Surgeon: Gery Pray, MD;  Location: [facility];  Service: Urology;  Laterality: N/A;  . TANDEM RING INSERTION N/A 08/11/2015   Procedure: TANDEM RING PLACEMENT ;  Surgeon: Gery Pray, MD;  Location: [facility];  Service: Urology;  Laterality: N/A;  . TANDEM RING INSERTION N/A 08/17/2015   Procedure: TANDEM RING PLACEMENT ;  Surgeon: Gery Pray, MD;  Location: [facility];  Service: Urology;  Laterality: N/A;  . TANDEM RING INSERTION N/A 08/26/2015   Procedure: TANDEM RING PLACEMENT ;  Surgeon: Gery Pray, MD;  Location: [facility];  Service: Urology;  Laterality: N/A;   HPI:  Pt is a 65/F with known Sz, h/o stroke with residual  left sided weakness, aneurysms in past, brought in for eval of bloody stools. Found to have intraabdominal fluid collections and work up revealed metastatic adenocardinoma of the appendix, was noted to be more altered yesterday. MRI brain was done without contrast - negative for stroke    Assessment / Plan / Recommendation Clinical Impression  Pt presents with a suspected mild  oropharygneal dysphagia which is impacted by her fluctuation in mentation. Pt with  missing dentition with greater dental loss on right side of oral cavity impeding timely and adequate bolus cohesion of solid PO. Family at bedside and report that the pts baseline swallow function  is without consistency restrictions, however they admit since hospitalizations softer solids have been pursued. Pt with cough following ice chips and inconsistent cough following thin liquids x1. Intermittent belching occured with potential esophageal component suspected.  Recommend dysphagia 2 (fine chopped) and thin liquids with medicines whole in puree and full supervision from staff to reduce aspiration risk and adherence to safe swallow precautions. ST to continue to monitor for diet tolerance.   SLP Visit Diagnosis: Dysphagia, unspecified (R13.10)    Aspiration Risk  Mild aspiration risk;Moderate aspiration risk    Diet Recommendation   Dysphagia 2 (fine chopped) thin liquids   Medication Administration: Whole meds with puree    Other  Recommendations Oral Care Recommendations: Oral care BID   Follow up Recommendations Skilled Nursing facility      Frequency and Duration min 1 x/week  1 week       Prognosis Prognosis for Safe Diet Advancement: Fair Barriers to Reach Goals: Severity of deficits;Time post onset      Swallow Study   General Date of Onset: 06/23/17 HPI: Pt is a 65/F with known Sz, h/o stroke with residual left sided weakness, aneurysms in past, brought in for eval of bloody stools. Found to have intraabdominal fluid collections and work up revealed metastatic adenocardinoma of the appendix, was noted to be more altered yesterday. MRI brain was done without contrast - negative for stroke  Type of Study: Bedside Swallow Evaluation Previous Swallow Assessment: none on file Diet Prior to this Study: Thin liquids Temperature Spikes Noted: No Respiratory Status: Nasal cannula History of Recent Intubation: No Behavior/Cognition: Alert;Requires cueing;Distractible Oral Cavity  Assessment: Within Functional Limits Oral Cavity - Dentition: Missing dentition;Poor condition Self-Feeding Abilities: Needs assist Patient Positioning: Upright in bed Baseline Vocal Quality: Low vocal intensity Volitional Cough: Strong Volitional Swallow: Unable to elicit    Oral/Motor/Sensory Function Overall Oral Motor/Sensory Function: Generalized oral weakness Facial Symmetry: Abnormal symmetry right;Other (Comment)   Ice Chips Ice chips: Impaired Presentation: Spoon Oral Phase Impairments: Reduced lingual movement/coordination Oral Phase Functional Implications: Prolonged oral transit Pharyngeal Phase Impairments: Suspected delayed Swallow;Multiple swallows;Cough - Delayed   Thin Liquid Thin Liquid: Impaired Presentation: Cup;Spoon;Straw Oral Phase Impairments: Reduced lingual movement/coordination Oral Phase Functional Implications: Prolonged oral transit Pharyngeal  Phase Impairments: Suspected delayed Swallow;Multiple swallows;Cough - Delayed    Nectar Thick Nectar Thick Liquid: Not tested   Honey Thick Honey Thick Liquid: Not tested   Puree Puree: Impaired Presentation: Spoon Oral Phase Impairments: Reduced lingual movement/coordination Oral Phase Functional Implications: Prolonged oral transit Pharyngeal Phase Impairments: Suspected delayed Swallow;Multiple swallows   Solid   GO   Solid: Impaired Oral Phase Impairments: Reduced lingual movement/coordination;Impaired mastication Oral Phase Functional Implications: Prolonged oral transit;Impaired mastication Pharyngeal Phase Impairments: Suspected delayed Swallow;Multiple swallows       Arvil Chaco MA, CCC-SLP Acute Care Speech Language Pathologist    [place]  [place] 06/29/2017,4:29 PM

## 2017-06-29 NOTE — Progress Notes (Signed)
PT Cancellation Note  Patient Details Name: Lori [name] MRN: [id number] DOB: November 01, 1951   Cancelled Treatment:    Reason Eval/Treat Not Completed: Medical issues which prohibited therapy RN reported pt very lethargic from suspected seizure yesterday and not participatory, and suggested to hold. Will follow up as schedule allows.   Leighton Ruff, PT, DPT  Acute Rehabilitation Services  Pager: [phone]    Rudean Hitt 06/29/2017, 12:25 PM

## 2017-06-29 NOTE — Progress Notes (Signed)
Daily Progress Note   Patient Name: Britanny Marksberry       Date: 06/29/2017 DOB: Jan 05, 1951  Age: 66 y.o. MRN#: [id number] Attending Physician: Bonnielee Haff, MD Primary Care Physician: [name], MD Admit Date: 06/23/2017  Reason for Consultation/Follow-up: Establishing goals of care and Psychosocial/spiritual support  Subjective: Pt had an acute change in clinical condition where she became non-verbal, . MRI negative for an acute CVA; does show encephalomalacia, atrophy. Son, [name], is at the beside.  He is struggling with his mother's decline and states " someone dropped the ball". He is requesting her records.   Length of Stay: 6  Current Medications: Scheduled Meds:  . carbamazepine  100 mg Oral TID  . gabapentin  300 mg Oral QHS  . insulin aspart  0-15 Units Subcutaneous Q4H  . metoprolol tartrate  25 mg Oral BID    Continuous Infusions: . ceFEPime (MAXIPIME) IV Stopped (06/29/17 1038)  . dextrose 5 % and 0.45% NaCl 1,000 mL with potassium chloride 60 mEq infusion 75 mL/hr at 06/29/17 0034  . metronidazole 500 mg (06/29/17 1008)    PRN Meds: alum & mag hydroxide-simeth, morphine injection, ondansetron **OR** ondansetron (ZOFRAN) IV, sodium chloride flush  Physical Exam  Constitutional: She appears well-developed and well-nourished.  Acutely ill , non-verbal female  HENT:  Head: Normocephalic and atraumatic.  Cardiovascular: Normal rate.   Neurological: She is alert.  Non-verbal Not following commands Will look at you when you speak to her but cannot consistently track  Skin: Skin is warm and dry.  Psychiatric:  Unable to test  Nursing note and vitals reviewed.           Vital Signs: BP 139/81 (BP Location: Right Arm)   Pulse 84   Temp (!) 97.4 F (36.3 C)  (Oral)   Resp 16   Ht _0  (1.753 m)   Wt 100 kg (220 lb 6.4 oz)   SpO2 98%   BMI 32.55 kg/m  SpO2: SpO2: 98 % O2 Device: O2 Device: Nasal Cannula O2 Flow Rate: O2 Flow Rate (L/min): 2 L/min  Intake/output summary:  Intake/Output Summary (Last 24 hours) at 06/29/17 1059 Last data filed at 06/29/17 0639  Gross per 24 hour  Intake           1902.5 ml  Output  1350 ml  Net            552.5 ml   LBM: Last BM Date: 06/26/17 Baseline Weight: Weight: 100 kg (220 lb 6.4 oz) Most recent weight: Weight: 100 kg (220 lb 6.4 oz)       Palliative Assessment/Data:    Flowsheet Rows     Most Recent Value  Intake Tab  Referral Department  Hospitalist  Unit at Time of Referral  Med/Surg Unit  Palliative Care Primary Diagnosis  Cancer  Date Notified  06/24/17  Palliative Care Type  New Palliative care  Reason for referral  Clarify Goals of Care  Date of Admission  06/23/17  Date first seen by Palliative Care  06/24/17  # of days Palliative referral response time  0 Day(s)  # of days IP prior to Palliative referral  1  Clinical Assessment  Psychosocial & Spiritual Assessment  Palliative Care Outcomes      Patient Active Problem List   Diagnosis Date Noted  . Goals of care, counseling/discussion   . Advance care planning   . Palliative care by specialist   . Intra-abdominal abscess ([place]) 06/23/2017  . Gastrointestinal hemorrhage with melena   . Hypokalemia   . Severe sepsis with septic shock ([place])   . Peritoneal carcinomatosis ([place])   . Seizure ([place])   . SBO (small bowel obstruction) ([place]) 05/15/2017  . HTN (hypertension) 05/15/2017  . Pulmonary nodule 03/26/2017  . 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/24/2015    Palliative Care Assessment & Plan   Patient Profile: 66 y.o.femalewith past medical history of DM2, seizures, HTN,  HLD, GERD, intracranial aneurysm repair (1997), cervical and appendiceal ca (s/p chemotherapy and radiation tx), now with recurrent Stage IV appendiceal adenocarcinoma found during last hospitalization at which time she underwent laparotomy for SBO with perforation and was discharged to SNF in [place] with wound VAC in place. She has not started therapy for recurrent appendiceal carcinoma yet with Dr. [name], but is planning to. She wasadmitted on 7/21/2018with GI bleeding- noting large clots and bleeding per rectum. Workup revealed Hgb of 5.6. CT scan shows 2 fluid collections- ?abscess vs metastatic disease?  Chart reviewed. Pt is not a candidate for further interventions for her metastatic appendiceal ca with carcinomatosis. Now, unfortunately,pt has had an acute change overnight 7/26 and is now non-verbal, only intermittently fc. Prior to 7/26, Palliaitive provider met with patient who was stating despite extensive disease wanted to remain a full code. Pt at this point is unable to make her own dicisions.   Assessment: Met with pt's son Veronica Fretz. He appears to be under a lot of stress 2/2 to his mother's illnesses and now with another clinical change. He is questioning if she has received the appropriate care, "somebody missed something", he is asking for medical records. Also he is questioning her anti-convulsant regimen.  Recommendations/Plan:  FU appt focused mainly on attempting to establish  rapport with family. Son is not in a good place this am but he did agree to let me speak to his wife, Seth Bake, who is a Marine scientist, to offer palliative medicine as an additional resource and source of support.  Continue full scope of treatment  Goals of Care and Additional Recommendations:  Limitations on Scope of Treatment: Full Scope Treatment  Code Status:    Code Status Orders        Start  Ordered   06/23/17 2351  Full code  Continuous     06/23/17 2350    Code Status History    Date  Active Date Inactive Code Status Order ID Comments User Context   05/29/2017  4:32 AM 06/08/2017  7:38 PM Full Code [id number]  Rise Patience, MD ED   05/15/2017  3:47 AM 05/19/2017  3:17 PM Full Code [id number]  Etta Quill, DO ED       Prognosis:   Unable to determine  Discharge Planning:  To Be Determined  Care plan was discussed with Dr. [place] Pink  Thank you for allowing the Palliative Medicine Team to assist in the care of this patient.   Time In: 1000 Time Out: 1025 Total Time 25 min Prolonged Time Billed  no       Greater than 50%  of this time was spent counseling and coordinating care related to the above assessment and plan.  Dory Horn, NP  Please contact Palliative Medicine Team phone at [phone] for questions and concerns.

## 2017-06-29 NOTE — Progress Notes (Signed)
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 She was at a skilled nursing facility. She was recently hospitalized for small bowel obstruction and underwent diagnostic laparoscopy in June. She underwent repair of small bowel perforation. She had a prolonged hospital stay. Subsequently was discharged to skilled nursing facility. Presented back with rectal bleeding. Seen by oncology. Not thought to be a candidate for any treatment. Then she had what appears to be a seizure resulting in encephalopathy. Neurology was consulted. Palliative medicine. Continues to follow.  Reason for Visit: Rectal bleeding  Consultants: Oncology. Palliative medicine.  Procedures:   EEG Impression EEG is abnormal and findings are suggestive of generalized cerebral dysfunction. Epileptiform features were not seen during this recording.  Antibiotics: Zosyn  Subjective/Interval History: Patient not very communicative this morning. Looks more awake and alert compared to yesterday, but not following commands. Eyes are open. Her family is at the bedside.  ROS: Unable to do at this time  Objective:  Vital Signs  Vitals:   06/28/17 1515 06/28/17 2239 06/29/17 0525 06/29/17 0611  BP: (!) 160/88 127/81 137/71 139/81  Pulse: 78 79 78 84  Resp: 16 16 15 16   Temp:  97.6 F (36.4 C) (!) 97.3 F (36.3 C) (!) 97.4 F (36.3 C)  TempSrc:  Axillary Axillary Oral  SpO2: 98% 99% 98% 98%  Weight:      Height:        Intake/Output Summary (Last 24 hours) at 06/29/17 0836 Last data filed at 06/29/17 0639  Gross per 24 hour  Intake            2262.5 ml  Output             1650 ml  Net            612.5 ml   Filed Weights   06/23/17 2347  Weight: 100 kg (220 lb 6.4 oz)    General appearance: Awake, alert. Distracted. No distress. Resp: Diminished air entry at the bases. No wheezing, rales or rhonchi Cardio: S1, S2 is normal, regular. No S3, S4. No rubs, murmurs, or bruit GI: Abdomen covered in dressing. Bowel sounds are sluggish but present. Extremities: Minimal edema noted bilateral lower extremities Neurologic: She is awake with eyes open. Moving her extremities but not necessarily following commands.  Lab Results:  Data Reviewed: I have personally reviewed following labs and imaging studies  CBC:  Recent Labs Lab 06/23/17 2121  06/25/17 1740 06/26/17 0628 06/27/17 0422 06/28/17 0423 06/29/17 0420  WBC 10.3  < > 13.2* 12.1* 11.6* 13.6* 15.9*  NEUTROABS 8.5*  --   --   --  9.6*  --   --   HGB 10.5*  < > 8.0* 7.8* 7.7* 8.3* 8.5*  HCT 34.2*  < > 24.6* 24.3* 24.9* 26.7* 29.5*  MCV 86.8  < > 84.8 85.6 86.8 88.7 93.1  PLT 434*  < > 371 366 421* 510* 586*  < > = values in this interval not displayed.  Basic Metabolic Panel:  Recent Labs Lab 06/26/17 0628 06/27/17 0422 06/27/17 1750 06/28/17 0423 06/29/17 0420  NA 146* 147* 143 144 144  K 2.4* 2.6* 3.3* 4.2 4.9  CL 109 111 110 112* 111  CO2  28 29 27 26 29   GLUCOSE 122* 109* 144* 139* 120*  BUN 15 11 9 7 6   CREATININE 1.09* 1.05* 0.98 0.99 0.88  CALCIUM 7.3* 7.3* 7.4* 7.7* 8.1*  MG  --  1.5*  --   --  1.7    GFR: Estimated Creatinine Clearance: 79.1 mL/min (by C-G formula based on SCr of 0.88 mg/dL).  Liver Function Tests:  Recent Labs Lab 06/23/17 2121 06/27/17 0422  AST 18 14*  ALT 11* 8*  ALKPHOS 89 90  BILITOT 0.4 0.5  PROT 5.3* 5.1*  ALBUMIN 1.3* 1.1*   CBG:  Recent Labs Lab 06/28/17 1730 06/28/17 2004 06/29/17 0010 06/29/17 0412 06/29/17 0751  GLUCAP 160* 149* 109* 114* 127*    Anemia Panel:  Recent Labs  06/27/17 1016   VITAMINB12 520    Recent Results (from the past 240 hour(s))  Urine culture     Status: Abnormal   Collection Time: 06/23/17  2:30 PM  Result Value Ref Range Status   Specimen Description URINE, RANDOM  Final   Special Requests NONE  Final   Culture >=100,000 COLONIES/mL ENTEROCOCCUS FAECALIS (A)  Final   Report Status 06/27/2017 FINAL  Final   Organism ID, Bacteria ENTEROCOCCUS FAECALIS (A)  Final      Susceptibility   Enterococcus faecalis - MIC*    AMPICILLIN <=2 SENSITIVE Sensitive     LEVOFLOXACIN 1 SENSITIVE Sensitive     NITROFURANTOIN <=16 SENSITIVE Sensitive     VANCOMYCIN 2 SENSITIVE Sensitive     * >=100,000 COLONIES/mL ENTEROCOCCUS FAECALIS  Culture, blood (routine x 2)     Status: None (Preliminary result)   Collection Time: 06/24/17 12:50 AM  Result Value Ref Range Status   Specimen Description BLOOD RIGHT HAND  Final   Special Requests   Final    BOTTLES DRAWN AEROBIC AND ANAEROBIC Blood Culture results may not be optimal due to an inadequate volume of blood received in culture bottles   Culture NO GROWTH 4 DAYS  Final   Report Status PENDING  Incomplete  Culture, blood (routine x 2)     Status: None (Preliminary result)   Collection Time: 06/24/17  1:00 AM  Result Value Ref Range Status   Specimen Description BLOOD RIGHT HAND  Final   Special Requests   Final    BOTTLES DRAWN AEROBIC AND ANAEROBIC Blood Culture results may not be optimal due to an inadequate volume of blood received in culture bottles   Culture NO GROWTH 4 DAYS  Final   Report Status PENDING  Incomplete      Radiology Studies: Mr Brain 70 Contrast  Result Date: 06/28/2017 CLINICAL DATA:  66 y/o F; patient presenting with increased lethargy, garbled speech, and facial droop. History seizures, hemorrhagic aneurysm, cervical and metastatic appendiceal carcinoma, and recent abdominal surgery. EXAM: MRI HEAD WITHOUT CONTRAST TECHNIQUE: Multiplanar, multiecho pulse sequences of the brain and  surrounding structures were obtained without intravenous contrast. COMPARISON:  06/28/2017 CT of the head. FINDINGS: Brain: No diffusion signal abnormality to suggest acute or early subacute infarction. Stable left cerebellar hemisphere encephalomalacia. Stable right frontal lobe encephalomalacia and with central mineralization. Moderate diffuse brain parenchymal volume loss. Scattered foci of T2 FLAIR hyperintense signal abnormality in periventricular white matter are nonspecific but compatible with chronic microvascular ischemic changes. There is diffusely increased susceptibility hypointensity throughout the vascular structures of the brain. Vascular: Normal flow voids. Skull and upper cervical spine: Normal marrow signal. Postsurgical changes related to a left suboccipital craniectomy. Sinuses/Orbits: Partial  opacification of the left-greater-than-right frontal sinuses and left anterior ethmoid air cells. Other: None. IMPRESSION: 1. No acute intracranial abnormality identified. 2. Left cerebellar hemisphere encephalomalacia and right frontal lobe encephalomalacia with central mineralization. 3. Diffusely increased susceptibility hypointensity throughout vascular structures in the brain, question recent iron replacement therapy. A component of siderosis may also be present. 4. Partial opacification of left-greater-than-right frontal sinuses and left anterior ethmoid air cells. 5. Mild chronic microvascular ischemic changes and moderate parenchymal volume loss of the brain. Electronically Signed   By: Kristine Garbe M.D.   On: 06/28/2017 22:25   Ct Head Code Stroke Wo Contrast  Result Date: 06/28/2017 CLINICAL DATA:  Code stroke.  Left facial droop. EXAM: CT HEAD WITHOUT CONTRAST TECHNIQUE: Contiguous axial images were obtained from the base of the skull through the vertex without intravenous contrast. COMPARISON:  06/17/2017 FINDINGS: Brain: A moderate-sized region of encephalomalacia with  associated calcification in the high right frontal lobe is unchanged. Left cerebellar encephalomalacia underlying a craniectomy defect is also unchanged. There is no evidence of acute infarct, mass, midline shift, or extra-axial fluid collection. Cerebral atrophy is unchanged. Patchy bilateral cerebral white matter hypodensities are similar to the prior study and nonspecific but compatible with moderate chronic small vessel ischemic disease. Vascular: Calcified atherosclerosis at the skullbase. No hyperdense vessel. Skull: Left suboccipital craniectomy. Right frontal burr hole. No acute fracture or suspicious osseous lesion. Sinuses/Orbits: Slightly progressive left frontal sinus mucosal thickening. Clear mastoid air cells. Unremarkable orbits. Other: None. ASPECTS [facility] Stroke Program Early CT Score) Not scored due to chronic right frontal encephalomalacia changes. IMPRESSION: 1. No evidence of acute intracranial abnormality. 2. 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 Results sent via text page to Dr. Rory Percy on 06/28/2017 at 2:54 p.m. Electronically Signed   By: Logan Bores M.D.   On: 06/28/2017 14:54     Medications:  Scheduled: . carbamazepine  100 mg Oral TID  . gabapentin  300 mg Oral QHS  . insulin aspart  0-15 Units Subcutaneous Q4H  . metoprolol tartrate  25 mg Oral BID   Continuous: . dextrose 5 % and 0.45% NaCl 1,000 mL with potassium chloride 60 mEq infusion 75 mL/hr at 06/29/17 0034  . piperacillin-tazobactam (ZOSYN)  IV 3.375 g (06/29/17 0609)   CVE:LFYB & mag hydroxide-simeth, morphine injection, ondansetron **OR** ondansetron (ZOFRAN) IV, sodium chloride flush  Assessment/Plan:  Active Problems:   Primary appendiceal adenocarcinoma (HCC)   HTN (hypertension)   Seizure ([place])   Peritoneal carcinomatosis ([place])   Intra-abdominal abscess ([place])   Goals of care, counseling/discussion   Advance care planning   Palliative care by specialist   Acute  Encephalopathy Patient had a sudden change in mental status the afternoon of 7/26. Initial concern was for stroke due to garbled speech. She was seen by neurology. She underwent CT head and MRI brain. No stroke was noted. Subsequently, it was felt that she may have had a seizure. Mental status has been very slow to improve. Neurology continues to follow. She is now on Keppra. EEG was done which did not show any epileptiform activity. MRI did not show any stroke. May have to do MRI with and without contrast if she does not improve over the next 1-2 days.  Primary appendiceal carcinoma with metastatic disease, stage IV Seen by oncology. Not a candidate for any chemotherapy and is thought to have an incurable condition.  Rectal bleeding of unknown cause. Appears to have subsided. Continue to monitor. This was discussed with general surgery.  Patient not a candidate for any endoscopic evaluation due to recent surgery.  History of seizure disorder Patient was on carbamazepine at home. This was not initiated at the time of admission. Was resumed on 7/25. See above regarding possible seizure activity. Being off of carbamazepine could've contributed to this episode. Neurology is following. Now on Keppra as well. Patient having difficulty with oral intake. Speech therapy has been consulted. Neurology recommended stopping Zosyn. They also recommend avoiding metronidazole. Unclear if these medications contributed directly to her seizure. Lack of carbamazepine in her system may have been the reason. We will continue metronidazole for now unless she has another seizure activity. Gabapentin has been discontinued by neurology. Continue to monitor for now.  Anemia due to acute blood loss. Monitor hemoglobin closely. Transfuse as needed. Hemoglobin appears to be stable.  Abdominal fluid collection noted on CT scan This was discussed with interventional radiology. All procedures were put on hold until family meets with  palliative medicine. Palliative medicine continues to assist. Patient was on Zosyn, which can lower seizure threshold. So this was changed over to cefepime and metronidazole.  Diabetes mellitus type 2. Continue SSI.  Severe hypokalemia with hypomagnesemia Potassium was repleted aggressively. Patient was also given magnesium. Levels are normal this morning.  Essential hypertension. Continue metoprolol.  Positive urine culture with enterococcus Unclear if she was never symptomatic. She was on Zosyn.  DVT Prophylaxis: SCDs    Code Status: Full code  Family Communication: Discussed with son who was the bedside. Disposition Plan: Management as outlined above.    LOS: 6 days   [facility] Pager [phone] 06/29/2017, 8:36 AM  If 7PM-7AM, please contact night-coverage at [email or web address], password [facility]

## 2017-06-29 NOTE — Progress Notes (Signed)
Patient unable to swallow oral medications at this time due to increased lethargy since yesterday's event. Speech has been consulted for swallow eval but has not seen pt yet so Dr. Maryland Pink was paged asking if any of the oral meds the patient has scheduled can be switched to IV form.

## 2017-06-29 NOTE — Progress Notes (Signed)
Neurology Progress note  S:// Seen and examined this afternoon. Mentation better than yesterday but not back to baseline per son.  O:// Vitals:   06/29/17 0525 06/29/17 0611  BP: 137/71 139/81  Pulse: 78 84  Resp: 15 16  Temp: (!) 97.3 F (36.3 C) (!) 97.4 F (36.3 C)  Gen: WD WN, restless shaking her head side to side occasionally HEENT: [place] AT MMM Clear nares and throat CVS: S1S2+ RRR Resp: CTABL Abd - +BS Ext: 2+ pedal edema b/l Neuro Awake, alert  Perseverating Following some commands - stuck her tongue out, raised right arm on command. Did not repeat Spoke a clear sentence - "give that to me please" CN: PERRL, EOMI, VFF to threat, face asymmetric with left facial droop Motor: Left UE weaker than right. Was antigravity on RUE, 2/5 LUE, Wiggling both toes but legs not antigravity. Sensory: grimaces and withdraws all 4 to nox stim Coord, gait can not be tested  Labs CBC    Component Value Date/Time   WBC 15.9 (H) 06/29/2017 0420   RBC 3.17 (L) 06/29/2017 0420   HGB 8.5 (L) 06/29/2017 0420   HCT 29.5 (L) 06/29/2017 0420   PLT 586 (H) 06/29/2017 0420   MCV 93.1 06/29/2017 0420   MCH 26.8 06/29/2017 0420   MCHC 28.8 (L) 06/29/2017 0420   RDW 16.5 (H) 06/29/2017 0420   LYMPHSABS 1.1 06/27/2017 0422   MONOABS 0.9 06/27/2017 0422   EOSABS 0.1 06/27/2017 0422   BASOSABS 0.0 06/27/2017 0422   CMP     Component Value Date/Time   NA 144 06/29/2017 0420   K 4.9 06/29/2017 0420   CL 111 06/29/2017 0420   CO2 29 06/29/2017 0420   GLUCOSE 120 (H) 06/29/2017 0420   BUN 6 06/29/2017 0420   CREATININE 0.88 06/29/2017 0420   CALCIUM 8.1 (L) 06/29/2017 0420   PROT 5.1 (L) 06/27/2017 0422   ALBUMIN 1.1 (L) 06/27/2017 0422   AST 14 (L) 06/27/2017 0422   ALT 8 (L) 06/27/2017 0422   ALKPHOS 90 06/27/2017 0422   BILITOT 0.5 06/27/2017 0422   GFRNONAA >60 06/29/2017 0420   GFRAA >60 06/29/2017 0420   Meds  Current Facility-Administered Medications:  .  alum & mag  hydroxide-simeth (MAALOX/MYLANTA) 200-200-20 MG/5ML suspension 30 mL, 30 mL, Oral, Q6H PRN, Gilles Chiquito B, MD .  carbamazepine (TEGRETOL) chewable tablet 100 mg, 100 mg, Oral, TID, Bonnielee Haff, MD, 100 mg at 06/28/17 0939 .  ceFEPIme (MAXIPIME) 2 g in dextrose 5 % 50 mL IVPB, 2 g, Intravenous, Q8H, Millen, Jessica B, RPH, Stopped at 06/29/17 1038 .  dextrose 5 % and 0.45% NaCl 1,000 mL with potassium chloride 60 mEq infusion, , Intravenous, Continuous, Samtani, Jai-Gurmukh, MD, Last Rate: 75 mL/hr at 06/29/17 1227 .  gabapentin (NEURONTIN) capsule 300 mg, 300 mg, Oral, QHS, Gilles Chiquito B, MD, 300 mg at 06/27/17 2125 .  insulin aspart (novoLOG) injection 0-15 Units, 0-15 Units, Subcutaneous, Q4H, Sid Falcon, MD, 2 Units at 06/29/17 1227 .  levETIRAcetam (KEPPRA) 500 mg in sodium chloride 0.9 % 100 mL IVPB, 500 mg, Intravenous, Q12H, Bonnielee Haff, MD .  metoprolol tartrate (LOPRESSOR) tablet 25 mg, 25 mg, Oral, BID, Gilles Chiquito B, MD, 25 mg at 06/28/17 0939 .  metroNIDAZOLE (FLAGYL) IVPB 500 mg, 500 mg, Intravenous, Q8H, Bonnielee Haff, MD, Stopped at 06/29/17 1108 .  morphine 4 MG/ML injection 1 mg, 1 mg, Intravenous, Q3H PRN, Nita Sells, MD, 1 mg at 06/28/17 2111 .  ondansetron (ZOFRAN)  tablet 4 mg, 4 mg, Oral, Q6H PRN **OR** ondansetron (ZOFRAN) injection 4 mg, 4 mg, Intravenous, Q6H PRN, Gilles Chiquito B, MD .  sodium chloride flush (NS) 0.9 % injection 10-40 mL, 10-40 mL, Intracatheter, PRN, Nita Sells, MD, 10 mL at 06/29/17 6468  Facility-Administered Medications Ordered in Other Encounters:  .  sodium chloride flush (NS) 0.9 % injection 10 mL, 10 mL, Intravenous, PRN, Kefalas, Manon Hilding, PA-C, 10 mL at 03/26/17 1050  Imaging MRI - no acute stroke Areas of old encephalomalacia left cerebellar and right frontal with calcification. No area of bleed or new infarct. Contrast was not used.  EEG: diffuse slowing. No eeg seizures. No focal slowing  A:// 65/F  with known Sz, h/o stroke with residual left sided weakness, aneurysms in past, brought in for eval of bloody stools. Found to have intraabdominal fluid collections and work up revealed metastatic adenocardinoma of the appendix, was noted to be more altered yesterday. Exam is much improved but mental status is still abnormal. She is only following some commands. She is perseverating, unable to name or repeat.  Differentials Stroke less likely with MRI done with no evidence of stroke Toxic metabolic enceph Seizure with prolonged post ictal state more likely in the setting of toxic metabolic encephalopathy. EEG showed evidence of encephalopathy too. MRI brain was done without contrast - negative for stroke but if mentation does not improve in a day or so, consider MRI w+w/o to look for evidence of carcinomatous meningitis or enhancement. After that, can also consider LP. Would not recommend LP prior to MRI with contrast as it can lead to spurious meningeal enhancement.  Recs: Increase Keppra to 1g BID (ordered) Continue Tegretol 100 TID D/C Gabapentin. Can contribute to restlessness and encephalopathy. (ordered) Would recommend avoiding metronidazole for the fear of lowering seizure threshold and encephalopathy Minimize sedating medications Correction of toxic metabolic derangements per primary team Communicated plan to Dr. Maryland Pink.  Amie Portland, MD [facility] [phone]  If 7pm to 7am, please call on call as listed on AMION.

## 2017-06-29 NOTE — Progress Notes (Signed)
    CC:  Lower gi Bleed  Subjective: Not speaking, resting this AM.  Multiple family members in the room.    Objective: Vital signs in last 24 hours: Temp:  [97.3 F (36.3 C)-97.6 F (36.4 C)] 97.4 F (36.3 C) (07/27 0611) Pulse Rate:  [78-90] 84 (07/27 0611) Resp:  [15-17] 16 (07/27 0611) BP: (127-160)/(71-88) 139/81 (07/27 0611) SpO2:  [98 %-99 %] 98 % (07/27 0611) Last BM Date: 06/26/17  Intake/Output from previous day: 07/26 0701 - 07/27 0700 In: 2262.5 [P.O.:360; I.V.:1642.5; IV Piggyback:260] Out: 5300 [Urine:1650] Intake/Output this shift: No intake/output data recorded.  General appearance: she is looking around when I looked at her dressing but didn't really try to speak.  GI: No real change in the abdomianl wound.  Still has slough at the base of the wound, with loose sutures.    Lab Results:   Recent Labs  06/28/17 0423 06/29/17 0420  WBC 13.6* 15.9*  HGB 8.3* 8.5*  HCT 26.7* 29.5*  PLT 510* 586*    BMET  Recent Labs  06/28/17 0423 06/29/17 0420  NA 144 144  K 4.2 4.9  CL 112* 111  CO2 26 29  GLUCOSE 139* 120*  BUN 7 6  CREATININE 0.99 0.88  CALCIUM 7.7* 8.1*   PT/INR No results for input(s): LABPROT, INR in the last 72 hours.   Recent Labs Lab 06/23/17 2121 06/27/17 0422  AST 18 14*  ALT 11* 8*  ALKPHOS 89 90  BILITOT 0.4 0.5  PROT 5.3* 5.1*  ALBUMIN 1.3* 1.1*     Lipase     Component Value Date/Time   LIPASE 31 05/14/2017 2106     Medications: . carbamazepine  100 mg Oral TID  . gabapentin  300 mg Oral QHS  . insulin aspart  0-15 Units Subcutaneous Q4H  . 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 Intra-abdominal abscess secondary to contain small bowel perforation. S/p . Diagnostic laparoscopy with peritoneal nodule biopsy  (consistent with metastatic adenocarcinoma on frozen section). 2. Exploratory laparotomy, drainage of abdominal abscess, repair of small bowel perforation, repair of small bowel enterotomy, enterocolostomy (mid ileum to mid transverse colon).05/30/17, Dr. Jackolyn Confer. Findings: There is diffuse carcinomatosis with a frozen pelvis. The tumor was heavily concentrated in the right lower quadrant and distal ileum leading to an obstruction. There are multiple peritoneal nodules present as well as multiple nodules on the small bowel in the small bowel mesentery, and sigmoid colon Code stroke, 06/28/17 - Neurology following Hx of IIB endocervical adenocarcinoma s/p chemoradiation Hx of appendiceal mucinous adenocarcinoma s/p appendectomy HTN Hx of cerebral aneurysm repair Hx of seizures Type II DM- diet controlled  FEN- IV fluids/Clears VTE- SCDs, No anticoagulants due to GI bleeding. ID- Zosyn 06/23/17 =>> day 5  Plan:  Continue wet to dry dressing to the abdominal wound.  We will see her again on Monday if she is still here.    LOS: 6 days    Lori [name] 06/29/2017 [phone]

## 2017-06-30 LAB — COMPREHENSIVE METABOLIC PANEL
ALBUMIN: 1.4 g/dL — AB (ref 3.5–5.0)
ALT: 8 U/L — ABNORMAL LOW (ref 14–54)
ANION GAP: 3 — AB (ref 5–15)
AST: 12 U/L — AB (ref 15–41)
Alkaline Phosphatase: 80 U/L (ref 38–126)
BUN: 7 mg/dL (ref 6–20)
CHLORIDE: 112 mmol/L — AB (ref 101–111)
CO2: 29 mmol/L (ref 22–32)
Calcium: 8.1 mg/dL — ABNORMAL LOW (ref 8.9–10.3)
Creatinine, Ser: 1.02 mg/dL — ABNORMAL HIGH (ref 0.44–1.00)
GFR calc Af Amer: >60 mL/min (ref >60)
GFR calc non Af Amer: 56 mL/min — ABNORMAL LOW (ref >60)
GLUCOSE: 141 mg/dL — AB (ref 65–99)
POTASSIUM: 4.6 mmol/L (ref 3.5–5.1)
SODIUM: 144 mmol/L (ref 135–145)
Total Bilirubin: 0.3 mg/dL (ref 0.3–1.2)
Total Protein: 5.8 g/dL — ABNORMAL LOW (ref 6.5–8.1)

## 2017-06-30 LAB — GLUCOSE, CAPILLARY
GLUCOSE-CAPILLARY: 130 mg/dL — AB (ref 65–99)
GLUCOSE-CAPILLARY: 102 mg/dL — AB (ref 65–99)
GLUCOSE-CAPILLARY: 89 mg/dL (ref 65–99)
Glucose-Capillary: 112 mg/dL — ABNORMAL HIGH (ref 65–99)
Glucose-Capillary: 86 mg/dL (ref 65–99)

## 2017-06-30 LAB — CBC
HCT: 27.9 % — ABNORMAL LOW (ref 36.0–46.0)
HEMOGLOBIN: 8.1 g/dL — AB (ref 12.0–15.0)
MCH: 26.8 pg (ref 26.0–34.0)
MCHC: 29 g/dL — AB (ref 30.0–36.0)
MCV: 92.4 fL (ref 78.0–100.0)
PLATELETS: 559 10*3/uL — AB (ref 150–400)
RBC: 3.02 MIL/uL — ABNORMAL LOW (ref 3.87–5.11)
RDW: 16.7 % — AB (ref 11.5–15.5)
WBC: 15.7 10*3/uL — AB (ref 4.0–10.5)

## 2017-06-30 MED ORDER — LORAZEPAM 2 MG/ML IJ SOLN
0.5000 mg | Freq: Once | INTRAMUSCULAR | Status: AC
Start: 2017-06-30 — End: 2017-06-30
  Administered 2017-06-30: 0.5 mg via INTRAVENOUS
  Filled 2017-06-30: qty 1

## 2017-06-30 MED ORDER — CARBAMAZEPINE 100 MG PO CHEW
150.0000 mg | CHEWABLE_TABLET | Freq: Three times a day (TID) | ORAL | Status: DC
  Administered 2017-07-04 – 2017-07-22 (×53): 150 mg via ORAL
  Filled 2017-06-30 (×49): qty 1.5
  Filled 2017-06-30: qty 2
  Filled 2017-06-30 (×15): qty 1.5

## 2017-06-30 MED ORDER — POTASSIUM CHLORIDE 2 MEQ/ML IV SOLN
INTRAVENOUS | Status: DC
  Administered 2017-06-30 – 2017-07-01 (×3): via INTRAVENOUS
  Filled 2017-06-30 (×5): qty 1000

## 2017-06-30 MED ORDER — WHITE PETROLATUM GEL
Status: AC
  Filled 2017-06-30: qty 1

## 2017-06-30 MED ORDER — WHITE PETROLATUM GEL
Status: AC
  Administered 2017-06-30: 06:00:00
  Filled 2017-06-30: qty 1

## 2017-06-30 NOTE — Progress Notes (Deleted)
Subjective: Laying in bed. Minimally responsive to sternal rub and loud commands with grunting and some lateral head movement. Son at bedside  Current Pertinent Medications: Ativan prn agitation - last given around 0100 Morphine prn pain - last given around 0000 Tegretol 100mg  TID Keppra 1000mg  BID  Pertinent Labs/Diagnostics: CT head 7/26: No acute abnormality. 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 MRI brain 7/26: No acute abnormality. 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 Mild chronic microvascular ischemic changes and moderate parenchymal volume loss of the brain. EEG 7/26:Generalized cerebral dysfunction. No epileptiform features  Physical Examination: Vitals:   06/30/17 0121 06/30/17 0500  BP: (!) 168/88 (!) 141/97  Pulse:  94  Resp:  20  Temp:  98.3 F (36.8 C)    General: WD obese female.  HEENT:  Normocephalic, no lesions, without obvious abnormality.  Normal external eye and conjunctiva.  Normal external ears. Normal external nose, mucus membranes and septum. Cardiovascular: S1, S2 normal   Pulmonary: Somewhat labored Abdomen: Wound is dressed Extremities: no joint deformities, effusion, or inflammation Musculoskeletal: no joint deformity or swelling Skin: warm and dry, no hyperpigmentation, vitiligo, or suspicious lesions  Neurological Examination:  CN: Pupils are equal and round. EOM grossly intact when patient opens eyes. Face with left droop. Hearing is intact to loud voice. Voice is present, unintelligible. Motor: She will not participate in exam. Wiggles toes spontaneously. Per report, she pulled off her nasal cannula last night Sensation: Verbally responds to sternal rub. She does not localize Coordination: She does not perform   Assessment: This is a 66 year old female with a past medical history significant for metastatic adenocarcinoma of the appendix, CVA with left sided deficits and known seizure disorder was  brought in for GIB and subsequently found to have intra-abdominal abscesses. She had a neurological event while not on antiepileptic medication in the hospital. Neurology was consulted for evaluation - favored to be a seizure as CT/MRI both without acute abnormality. Ms. Totino continues to be encephalopathic. Last MRI was two days ago -  Consider an additional MRI within the next day or so (with/without) to evaluate for carcinomatous meningitis or enhancement, and LP if imaging is unrevealing.  Recommendations: 1) Continue medications as above 2) Limit sedation as much as agitation will allow 3) Correction of metabolic derangements per the primary team 4) Would recommend avoiding metronidazole for the fear of lowering seizure threshold and encephalopathy 5) We will continue to follow. Please call with questions.  Solon Augusta PA-C [facility] [phone]  06/30/2017, 9:06 AM

## 2017-06-30 NOTE — Clinical Social Work Note (Signed)
Discharge plan TBD, per palliative care. CSW will continue to follow and assist with disposition planning as needed.   Oretha Ellis, [place], [place] [facility]  [phone]

## 2017-06-30 NOTE — Progress Notes (Signed)
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 She was at a skilled nursing facility. She was recently hospitalized for small bowel obstruction and underwent diagnostic laparoscopy in June. She underwent repair of small bowel perforation. She had a prolonged hospital stay. Subsequently was discharged to skilled nursing facility. Presented back with rectal bleeding. Seen by oncology. Not thought to be a candidate for any treatment. Then she had what appears to be a seizure resulting in encephalopathy. Neurology was consulted. Palliative medicine continues to follow.  Reason for Visit: Rectal bleeding  Consultants: Oncology. Palliative medicine. Neurology  Procedures:   EEG Impression EEG is abnormal and findings are suggestive of generalized cerebral dysfunction. Epileptiform features were not seen during this recording.  Antibiotics: Zosyn  Subjective/Interval History: Patient noted to be delirious this morning. Apparently she was quite agitated overnight and was given Ativan along with morphine. Her family is at bedside.   ROS: Unable to do at this time  Objective:  Vital Signs  Vitals:   06/29/17 1407 06/29/17 2101 06/30/17 0121 06/30/17 0500  BP: (!) 165/83 (!) 174/80 (!) 168/88 (!) 141/97  Pulse: 81 94  94  Resp: 18 19  20   Temp: 97.7 F (36.5 C) 98.7 F (37.1 C)  98.3 F (36.8 C)  TempSrc: Oral Oral  Oral  SpO2: 98% 93% 97% 94%  Weight:      Height:        Intake/Output Summary (Last 24 hours) at 06/30/17 1007 Last data filed at 06/30/17 0600  Gross per 24 hour  Intake             1468 ml    Output              500 ml  Net              968 ml   Filed Weights   06/23/17 2347  Weight: 100 kg (220 lb 6.4 oz)    General appearance: Noted to be delirious this morning Resp: Diminished air entry at bases. No wheezing. No rales or rhonchi. Cardio: S1, S2 normal. Regular. No S3, S4. No rubs, murmurs or bruits GI: Abdomen covered in dressing. Appears to be nontender. Extremities: Edema noted bilateral lower extremities Neurologic: Confused, distracted. Does open her eyes. Noted to be moving both her upper extremities. Not speaking.  Lab Results:  Data Reviewed: I have personally reviewed following labs and imaging studies  CBC:  Recent Labs Lab 06/23/17 2121  06/26/17 0628 06/27/17 0422 06/28/17 0423 06/29/17 0420 06/30/17 0439  WBC 10.3  < > 12.1* 11.6* 13.6* 15.9* 15.7*  NEUTROABS 8.5*  --   --  9.6*  --   --   --   HGB 10.5*  < > 7.8* 7.7* 8.3* 8.5* 8.1*  HCT 34.2*  < > 24.3* 24.9* 26.7* 29.5* 27.9*  MCV 86.8  < > 85.6 86.8 88.7 93.1 92.4  PLT 434*  < > 366 421* 510* 586* 559*  < > = values in this interval not displayed.  Basic Metabolic Panel:  Recent Labs Lab 06/27/17 0422 06/27/17 1750 06/28/17 0423 06/29/17 0420 06/30/17 0439  NA 147* 143 144 144 144  K 2.6* 3.3* 4.2 4.9 4.6  CL 111  110 112* 111 112*  CO2 29 27 26 29 29   GLUCOSE 109* 144* 139* 120* 141*  BUN 11 9 7 6 7   CREATININE 1.05* 0.98 0.99 0.88 1.02*  CALCIUM 7.3* 7.4* 7.7* 8.1* 8.1*  MG 1.5*  --   --  1.7  --     GFR: Estimated Creatinine Clearance: 68.3 mL/min (A) (by C-G formula based on SCr of 1.02 mg/dL (H)).  Liver Function Tests:  Recent Labs Lab 06/23/17 2121 06/27/17 0422 06/30/17 0439  AST 18 14* 12*  ALT 11* 8* 8*  ALKPHOS 89 90 80  BILITOT 0.4 0.5 0.3  PROT 5.3* 5.1* 5.8*  ALBUMIN 1.3* 1.1* 1.4*   CBG:  Recent Labs Lab 06/29/17 1212 06/29/17 1601 06/29/17 1958 06/30/17 0010 06/30/17 0431  GLUCAP 134* 142* 142* 112* 130*    Anemia Panel:  Recent  Labs  06/27/17 1016  VITAMINB12 520    Recent Results (from the past 240 hour(s))  Urine culture     Status: Abnormal   Collection Time: 06/23/17  2:30 PM  Result Value Ref Range Status   Specimen Description URINE, RANDOM  Final   Special Requests NONE  Final   Culture >=100,000 COLONIES/mL ENTEROCOCCUS FAECALIS (A)  Final   Report Status 06/27/2017 FINAL  Final   Organism ID, Bacteria ENTEROCOCCUS FAECALIS (A)  Final      Susceptibility   Enterococcus faecalis - MIC*    AMPICILLIN <=2 SENSITIVE Sensitive     LEVOFLOXACIN 1 SENSITIVE Sensitive     NITROFURANTOIN <=16 SENSITIVE Sensitive     VANCOMYCIN 2 SENSITIVE Sensitive     * >=100,000 COLONIES/mL ENTEROCOCCUS FAECALIS  Culture, blood (routine x 2)     Status: None   Collection Time: 06/24/17 12:50 AM  Result Value Ref Range Status   Specimen Description BLOOD RIGHT HAND  Final   Special Requests   Final    BOTTLES DRAWN AEROBIC AND ANAEROBIC Blood Culture results may not be optimal due to an inadequate volume of blood received in culture bottles   Culture NO GROWTH 5 DAYS  Final   Report Status 06/29/2017 FINAL  Final  Culture, blood (routine x 2)     Status: None   Collection Time: 06/24/17  1:00 AM  Result Value Ref Range Status   Specimen Description BLOOD RIGHT HAND  Final   Special Requests   Final    BOTTLES DRAWN AEROBIC AND ANAEROBIC Blood Culture results may not be optimal due to an inadequate volume of blood received in culture bottles   Culture NO GROWTH 5 DAYS  Final   Report Status 06/29/2017 FINAL  Final      Radiology Studies: Mr Brain Wo Contrast  Result Date: 06/28/2017 CLINICAL DATA:  66 y/o F; patient presenting with increased lethargy, garbled speech, and facial droop. History seizures, hemorrhagic aneurysm, cervical and metastatic appendiceal carcinoma, and recent abdominal surgery. EXAM: MRI HEAD WITHOUT CONTRAST TECHNIQUE: Multiplanar, multiecho pulse sequences of the brain and surrounding  structures were obtained without intravenous contrast. COMPARISON:  06/28/2017 CT of the head. FINDINGS: Brain: No diffusion signal abnormality to suggest acute or early subacute infarction. Stable left cerebellar hemisphere encephalomalacia. Stable right frontal lobe encephalomalacia and with central mineralization. Moderate diffuse brain parenchymal volume loss. Scattered foci of T2 FLAIR hyperintense signal abnormality in periventricular white matter are nonspecific but compatible with chronic microvascular ischemic changes. There is diffusely increased susceptibility hypointensity throughout the vascular structures of the brain. Vascular: Normal flow voids. Skull and upper cervical  spine: Normal marrow signal. Postsurgical changes related to a left suboccipital craniectomy. Sinuses/Orbits: Partial opacification of the left-greater-than-right frontal sinuses and left anterior ethmoid air cells. Other: None. IMPRESSION: 1. No acute intracranial abnormality identified. 2. Left cerebellar hemisphere encephalomalacia and right frontal lobe encephalomalacia with central mineralization. 3. Diffusely increased susceptibility hypointensity throughout vascular structures in the brain, question recent iron replacement therapy. A component of siderosis may also be present. 4. Partial opacification of left-greater-than-right frontal sinuses and left anterior ethmoid air cells. 5. Mild chronic microvascular ischemic changes and moderate parenchymal volume loss of the brain. Electronically Signed   By: Kristine Garbe M.D.   On: 06/28/2017 22:25   Ct Head Code Stroke Wo Contrast  Result Date: 06/28/2017 CLINICAL DATA:  Code stroke.  Left facial droop. EXAM: CT HEAD WITHOUT CONTRAST TECHNIQUE: Contiguous axial images were obtained from the base of the skull through the vertex without intravenous contrast. COMPARISON:  06/17/2017 FINDINGS: Brain: A moderate-sized region of encephalomalacia with associated  calcification in the high right frontal lobe is unchanged. Left cerebellar encephalomalacia underlying a craniectomy defect is also unchanged. There is no evidence of acute infarct, mass, midline shift, or extra-axial fluid collection. Cerebral atrophy is unchanged. Patchy bilateral cerebral white matter hypodensities are similar to the prior study and nonspecific but compatible with moderate chronic small vessel ischemic disease. Vascular: Calcified atherosclerosis at the skullbase. No hyperdense vessel. Skull: Left suboccipital craniectomy. Right frontal burr hole. No acute fracture or suspicious osseous lesion. Sinuses/Orbits: Slightly progressive left frontal sinus mucosal thickening. Clear mastoid air cells. Unremarkable orbits. Other: None. ASPECTS [facility] Stroke Program Early CT Score) Not scored due to chronic right frontal encephalomalacia changes. IMPRESSION: 1. No evidence of acute intracranial abnormality. 2. 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 Results sent via text page to Dr. Rory Percy on 06/28/2017 at 2:54 p.m. Electronically Signed   By: Logan Bores M.D.   On: 06/28/2017 14:54     Medications:  Scheduled: . carbamazepine  150 mg Oral TID  . insulin aspart  0-15 Units Subcutaneous Q4H  . metoprolol tartrate  25 mg Oral BID   Continuous: . ceFEPime (MAXIPIME) IV 2 g (06/30/17 0949)  . dextrose 5 % and 0.45% NaCl 1,000 mL with potassium chloride 20 mEq infusion    . levETIRAcetam Stopped (06/30/17 0028)   NUU:VOZD & mag hydroxide-simeth, morphine injection, ondansetron **OR** ondansetron (ZOFRAN) IV, sodium chloride flush  Assessment/Plan:  Active Problems:   Primary appendiceal adenocarcinoma (HCC)   HTN (hypertension)   Seizure ([place])   Peritoneal carcinomatosis ([place])   Intra-abdominal abscess (HCC)   Goals of care, counseling/discussion   Advance care planning   Palliative care by specialist   Acute Metabolic Encephalopathy Patient had a sudden  change in mental status the afternoon of 7/26. Initial concern was for stroke due to garbled speech. She was seen by neurology. She underwent CT head and MRI brain. No stroke was noted. Subsequently, it was felt that she may have had a seizure. Patient was loaded with Keppra. Started on scheduled intravenous Keppra. EEG did not show any epileptiform activity. MRI did not show any strokes. Neurology continues to follow. Patient's mental status remains encephalopathic. Keppra dose increased today. Discussed with neurology. If she does not show improvement soon. MRI brain will be repeated with contrast. May also have to consider LP. Discussed with family at bedside. Prognosis remains poor for reasons outlined below.   Primary appendiceal carcinoma with metastatic disease, stage IV Seen by oncology. Not a candidate for  any chemotherapy and is thought to have an incurable condition. Poor prognosis. Palliative medicine is following.  Rectal bleeding of unknown cause. Appears to have subsided. Continue to monitor. This was discussed with general surgery. Patient not a candidate for any endoscopic evaluation due to recent surgery.  History of seizure disorder Patient was on carbamazepine at home. This was not initiated at the time of admission. Was resumed on 7/25. See above regarding possible seizure activity. Being off of carbamazepine could've contributed to this episode, but her episode did occur one day after she was reinitiated on carbamazepine. Neurology is following. Now on Keppra as well. Speech therapy is following. Zosyn was stopped. She was switched over to cefepime and metronidazole. Neurology recommending discontinuing Flagyl as well. This was done today. Gabapentin has also been discontinued.   Anemia due to acute blood loss. Monitor hemoglobin closely. Transfuse as needed. Hemoglobin appears to be stable.  Abdominal fluid collection noted on CT scan This was discussed with interventional  radiology. All procedures were put on hold. Palliative medicine continues to assist. Patient was on Zosyn, which can lower seizure threshold. So this was changed over to cefepime and metronidazole. Now only on cefepime.  Diabetes mellitus type 2. Continue SSI.  Severe hypokalemia with hypomagnesemia Potassium was repleted aggressively. Patient was also given magnesium. Levels have improved.  Essential hypertension. Continue metoprolol.  Positive urine culture with enterococcus Unclear if she was never symptomatic. Was treated with antibiotics. Should've completed course.  Patient continues to decline. She has poor prognosis for reasons outlined above. Palliative medicine continues to follow. Family still expecting patient to turn around. Seizure activity may have set her back, but her overall prognosis remains unchanged. Would request palliative medicine continue discussions with family. If she does not improve in the next few days she will be hospice appropriate.   DVT Prophylaxis: SCDs    Code Status: Full code  Family Communication: Discussed with family who was the bedside. Disposition Plan: Management as outlined above.    LOS: 7 days   [facility] Pager [phone] 06/30/2017, 10:07 AM  If 7PM-7AM, please contact night-coverage at [email or web address], password [facility]

## 2017-06-30 NOTE — Progress Notes (Addendum)
Pt very agitated and anxious at this time. Pt pulling off oxygen, O2 sat dropping on room air. When redirected pt yells back and uncooperative. Notified On Call Doctor.  PRN Ativan .5 mg administered per order.   Addendum: Pt's son repeatedly insisting to have medication for anxiety given to pt to calm her down.

## 2017-06-30 NOTE — Progress Notes (Signed)
Neurology Progress note  S:// Seen and examined Was given sedating medication around 1am per her son at bedside. She has been sleeping since then.  O:// Vitals:   06/30/17 0121 06/30/17 0500  BP: (!) 168/88 (!) 141/97  Pulse:  94  Resp:  20  Temp:  98.3 F (36.8 C)   Gen: WD WN, restless shaking her head side to side occasionally HEENT: [place] AT MMM Clear nares and throat CVS: S1S2+ RRR Resp: CTABL Abd - +BS Ext: 2+ pedal edema b/l Neuro Sleeping, opens eyes to voice. Does not follow commands. CN: PERRL, EOMI, VFF to threat, face asymmetric with left facial droop Motor: did not follow but withdrew to nox stim, in the UE - rt stronger than left. Sensory: grimaces and withdraws b/l UE to nox stim  Labs CBC    Component Value Date/Time   WBC 15.7 (H) 06/30/2017 0439   RBC 3.02 (L) 06/30/2017 0439   HGB 8.1 (L) 06/30/2017 0439   HCT 27.9 (L) 06/30/2017 0439   PLT 559 (H) 06/30/2017 0439   MCV 92.4 06/30/2017 0439   MCH 26.8 06/30/2017 0439   MCHC 29.0 (L) 06/30/2017 0439   RDW 16.7 (H) 06/30/2017 0439   LYMPHSABS 1.1 06/27/2017 0422   MONOABS 0.9 06/27/2017 0422   EOSABS 0.1 06/27/2017 0422   BASOSABS 0.0 06/27/2017 0422   CMP     Component Value Date/Time   NA 144 06/30/2017 0439   K 4.6 06/30/2017 0439   CL 112 (H) 06/30/2017 0439   CO2 29 06/30/2017 0439   GLUCOSE 141 (H) 06/30/2017 0439   BUN 7 06/30/2017 0439   CREATININE 1.02 (H) 06/30/2017 0439   CALCIUM 8.1 (L) 06/30/2017 0439   PROT 5.8 (L) 06/30/2017 0439   ALBUMIN 1.4 (L) 06/30/2017 0439   AST 12 (L) 06/30/2017 0439   ALT 8 (L) 06/30/2017 0439   ALKPHOS 80 06/30/2017 0439   BILITOT 0.3 06/30/2017 0439   GFRNONAA 56 (L) 06/30/2017 0439   GFRAA >60 06/30/2017 0439    Imaging MRI - no acute stroke Areas of old encephalomalacia left cerebellar and right frontal with calcification. No area of bleed or new infarct. Contrast was not used.  EEG: diffuse slowing. No eeg seizures. No focal  slowing  A:// 65/F with known Sz, h/o stroke with residual left sided weakness, aneurysms in past, brought in for eval of bloody stools. Found to have intraabdominal fluid collections and work up revealed metastatic adenocardinoma of the appendix, was noted to be more altered yesterday. She was very drowsy this AM, but had received Atvan as she was very agitated overnight.  Differentials Stroke less likely with MRI done with no evidence of stroke Toxic metabolic enceph Seizure with prolonged post ictal state more likely in the setting of toxic metabolic encephalopathy. EEG showed evidence of encephalopathy too.  Recs: -c/w Keppra to 1g BID (ordered) -increase Tegretol to 150 TID (although I doubt she is having more seizures, but she has large areasof encephalomalacia and any of those could be a source of abnormal electrographic discharges contributing to her mentation decline) -Would recommend avoiding seizure threshold lowering meds as much as possible. -Minimize sedating medications. Can use Seroquel 12.5 at night if QTc WNL. -Correction of toxic metabolic derangements per primary team -Will consider MRI with contrast if mental status does not improve in the next 24 hours. And possibly consider a LP after that, but doubt that it would change any plans or add much value to her care. -Continuing  [place] discussions per palliative as you are.  Amie Portland, MD [facility] [phone]  If 7pm to 7am, please call on call as listed on AMION.

## 2017-07-01 ENCOUNTER — Inpatient Hospital Stay (HOSPITAL_COMMUNITY): Payer: Medicare Other

## 2017-07-01 DIAGNOSIS — G9340 Encephalopathy, unspecified: Secondary | ICD-10-CM

## 2017-07-01 LAB — GLUCOSE, CAPILLARY
GLUCOSE-CAPILLARY: 104 mg/dL — AB (ref 65–99)
GLUCOSE-CAPILLARY: 117 mg/dL — AB (ref 65–99)
GLUCOSE-CAPILLARY: 123 mg/dL — AB (ref 65–99)
Glucose-Capillary: 103 mg/dL — ABNORMAL HIGH (ref 65–99)
Glucose-Capillary: 111 mg/dL — ABNORMAL HIGH (ref 65–99)
Glucose-Capillary: 101 mg/dL — ABNORMAL HIGH (ref 65–99)
Glucose-Capillary: 126 mg/dL — ABNORMAL HIGH (ref 65–99)

## 2017-07-01 LAB — CBC
HEMATOCRIT: 25.2 % — AB (ref 36.0–46.0)
HEMOGLOBIN: 7.3 g/dL — AB (ref 12.0–15.0)
MCH: 26.4 pg (ref 26.0–34.0)
MCHC: 29 g/dL — ABNORMAL LOW (ref 30.0–36.0)
MCV: 91 fL (ref 78.0–100.0)
Platelets: 448 10*3/uL — ABNORMAL HIGH (ref 150–400)
RBC: 2.77 MIL/uL — AB (ref 3.87–5.11)
RDW: 16.5 % — ABNORMAL HIGH (ref 11.5–15.5)
WBC: 11.6 10*3/uL — AB (ref 4.0–10.5)

## 2017-07-01 LAB — BASIC METABOLIC PANEL
ANION GAP: 9 (ref 5–15)
BUN: 7 mg/dL (ref 6–20)
CHLORIDE: 112 mmol/L — AB (ref 101–111)
CO2: 29 mmol/L (ref 22–32)
Calcium: 8.6 mg/dL — ABNORMAL LOW (ref 8.9–10.3)
Creatinine, Ser: 1.09 mg/dL — ABNORMAL HIGH (ref 0.44–1.00)
GFR calc Af Amer: 60 mL/min — ABNORMAL LOW (ref >60)
GFR calc non Af Amer: 52 mL/min — ABNORMAL LOW (ref >60)
GLUCOSE: 130 mg/dL — AB (ref 65–99)
POTASSIUM: 3.8 mmol/L (ref 3.5–5.1)
Sodium: 150 mmol/L — ABNORMAL HIGH (ref 135–145)

## 2017-07-01 MED ORDER — DEXTROSE 5 % IV BOLUS
250.0000 mL | Freq: Once | INTRAVENOUS | Status: AC
  Administered 2017-07-01: 250 mL via INTRAVENOUS

## 2017-07-01 MED ORDER — POTASSIUM CL IN DEXTROSE 5% 20 MEQ/L IV SOLN
20.0000 meq | INTRAVENOUS | Status: DC
  Administered 2017-07-01 – 2017-07-02 (×2): 20 meq via INTRAVENOUS
  Filled 2017-07-01 (×2): qty 1000

## 2017-07-01 MED ORDER — GADOBENATE DIMEGLUMINE 529 MG/ML IV SOLN
20.0000 mL | Freq: Once | INTRAVENOUS | Status: AC
  Administered 2017-07-01 (×2): 20 mL via INTRAVENOUS

## 2017-07-01 MED ORDER — MORPHINE SULFATE (PF) 4 MG/ML IV SOLN
1.0000 mg | INTRAVENOUS | Status: DC | PRN
  Administered 2017-07-01 – 2017-07-02 (×5): 1 mg via INTRAVENOUS
  Filled 2017-07-01 (×5): qty 1

## 2017-07-01 MED ORDER — LABETALOL HCL 5 MG/ML IV SOLN
5.0000 mg | Freq: Once | INTRAVENOUS | Status: AC
  Administered 2017-07-01: 5 mg via INTRAVENOUS
  Filled 2017-07-01 (×2): qty 4

## 2017-07-01 MED ORDER — SODIUM CHLORIDE 0.9 % IV SOLN
1000.0000 mg | Freq: Two times a day (BID) | INTRAVENOUS | Status: DC
  Administered 2017-07-01 – 2017-07-10 (×19): 1000 mg via INTRAVENOUS
  Filled 2017-07-01 (×20): qty 10

## 2017-07-01 NOTE — Progress Notes (Addendum)
Subjective: Lori [name] was laying in bed on my arrival, spontaneously moving her right arm, head and wiggling her toes. This does not appear to be purposeful movement. Her eyes are open. She was unresponsive to commands. Son at bedside. Tegretol was increased to 150 TID yesterday. Son reports that shes been more drowsy as a result of Morphine and Ativan and requests minimizing them.  Current Pertinent Medications: Tegretol 150mg  TID Keppra 1000mg  BID Cefepime 2g q8h  Pertinent Labs/Diagnostics: CT head 7/26: No acute abnormality. 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 MRI brain 7/26: No acute abnormality. 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 Mild chronic microvascular ischemic changes and moderate parenchymal volume loss of the brain. EEG 7/26:Generalized cerebral dysfunction. No epileptiform features  Physical Examination: Vitals:   07/01/17 0222 07/01/17 0430  BP: (!) 153/53 (!) 152/92  Pulse: 90 (!) 113  Resp: 20 (!) 22  Temp: 98.2 F (36.8 C) 98.5 F (36.9 C)    General: WD obese female.  HEENT:  Normocephalic, no lesions, without obvious abnormality.  Normal external eye and conjunctiva.  Normal external ears. Normal external nose, mucus membranes and septum.  Normal pharynx. Cardiovascular: S1, S2 normal ; 2+ pedal edema bilaterally Pulmonary: Unlabored Abdomen: Dressed, soft Extremities: no joint deformities, effusion, or inflammation Musculoskeletal: no joint deformity or swelling Skin: warm and dry, no hyperpigmentation, vitiligo, or suspicious lesions  Neurological Examination:  CN: Pupils are equal, round and reactive. EOM grossly intact. Blinks to threat. Face with slight leftsided droop. Motor: Moves right arm, head and toes bilaterally spontaneously Sensation: Grimaces and withdraws to noxious stimuli in the BUE, right>left DTRs: 2+, symmetric  Toes downgoing bilaterally. No pathologic reflexes.  Coordination:  Finger-to-nose and heel-to-shin are without dysmetria    Assessment:  This is a 66 year old female with a past medical history significant for metastatic adenocarcinoma of the appendix, CVA with left sided deficits and known seizure disorder was brought in for GIB and subsequently found to have intra-abdominal abscesses. She had a neurological event while not on antiepileptic medication in the hospital. Neurology was consulted for evaluation - favored to be a seizure as CT/MRI both without acute abnormality. She has not had an additional seizure since beginning Keppra, although she continues to be encephalopathic.  Lori [name]  has been on several antibiotics for treatment of her intra-abdominal abscesses and UTI. Most recently, she's been treated with cefepime, which may be causing her reduced mental status. Although cefepime induced encephalopathy is typically reported in patients with renal dysfunction, a case report published in 2016 described a patient with normal renal function who was treated with cefepime and subsequently developed encephalopathy that resolved with the cessation of the antibiotic. For this reason, we suggest medicine change cefepime to a medication that has similar coverage but is less likely to cause CNS dysfunction (i.e. Ceftriaxone). Although encephalopathy is also possible with this medication, it reportedly exceedingly rare compared to 3rd generation cephalosporins.  We will get a follow up MRI with and without contrast to rule out carcinomatous meningitis or enhancement that may be driving her encephalopathy.  Recommendations: 1) Continue Keppra 1000mg  BID and Tegretol 150 TID 2) Consider ceftriaxone 3) Limit sedating medications as agitation allows 4) Correction of metabolic derangements per primary team 5) MRI with and without contrast 6) Continue [place] conversations with palliative medicine 7) LP can be considered after MRI (for cytology primarily) if MRI with  contrast does not add to the existing information. We will continue to follow. Please call  with any questions.   Solon Augusta PA-C [facility] [phone]  07/01/2017, 9:22 AM  Neuro hospitalist addendum. Patient was seen and examined at bedside with Solon Augusta, PA-C. I agree with the documentation above which I helped formulate. I have reviewed the note made the necessary edits and corrections to the note. Plan was communicated to the primary hospitalist. We have taken the liberty to order the MRI with and without contrast. Consideration can be given to a LP for cytology, if the MRI does not add further value to the prior noncontrast MRI. I have personally discussed the poor prognosis and encouraged the son at bedside to speak with palliative care regarding the patient's care.  -- Amie Portland, MD [facility] [phone]  If 7pm to 7am, please call on call as listed on AMION.    Neuro hospitalist addendum after MRI imaging. MRI imaging degraded by motion. Reveals possible left thalamic area of acute infarct. Cannot explain clinical situation currently. Also reported bilateral small subdurals. Again, unlikely to be related to the current clinical picture. Neurology team will continue to follow with you in the morning. Not sure how much of a value spinal tap would add given no meningeal enhancement on MRI. Her clinical picture might still be multifactorial encephalopathy in the setting of multiple toxic metabolic derangements. I would recommend continuing rectification of the derangements along with continuing palliative efforts.

## 2017-07-01 NOTE — Progress Notes (Signed)
Attempted to give report. Nurse to call back.

## 2017-07-01 NOTE — Progress Notes (Signed)
[facility] to [facility].

## 2017-07-01 NOTE — Progress Notes (Signed)
Daily Progress Note   Patient Name: Lori [name]       Date: 07/01/2017 DOB: 06-05-1951  Age: 66 y.o. MRN#: [id number] Attending Physician: Bonnielee Haff, MD Primary Care Physician: [name], MD Admit Date: 06/23/2017  Reason for Consultation/Follow-up: Establishing goals of care and Psychosocial/spiritual support  Subjective: Called by Dr. [place] Pink to reassess pt this am. Pt continues to decline. She now is non-verbal, worsening involuntary movements to RUE. MRI repeated today and 2 new areas bilaterally SDH. Multiple family members at the bedside. She is unable to take anything by mouth When I met with son, Lori [name], he requested that MS04 be discontinued, concerned that med effects were what they were seeing in terms of worsening encephalopathy.  I returned later to met with pt's other son, Lori [name] who along with multiple family members were at the bedside. Both Shan and [place] feel she looks uncomfortable ( pt having involuntary movements of RUE ) and asked that MS04 be resumed which I did. I did verbalize my concern for pt given clinical decline and readdressed code status. Family remains clear that they would want full scope of treatment including CPR, defibrillation, intubation. Did share with sons the results of MRI done today that shows 2 new small areas SDH  Length of Stay: 8  Current Medications: Scheduled Meds:  . carbamazepine  150 mg Oral TID  . insulin aspart  0-15 Units Subcutaneous Q4H  . metoprolol tartrate  25 mg Oral BID    Continuous Infusions: . ceFEPime (MAXIPIME) IV Stopped (07/01/17 0352)  . dextrose    . dextrose 5 % with KCl 20 mEq / L    . levETIRAcetam 1,000 mg (07/01/17 1437)    PRN Meds: alum & mag hydroxide-simeth, morphine injection, ondansetron  **OR** ondansetron (ZOFRAN) IV, sodium chloride flush  Physical Exam  Constitutional: She appears well-developed and well-nourished.  Acutely ill appearing older female; she is nonverbal, unable to follow commands  Cardiovascular:  Tachycardic  Pulmonary/Chest: Effort normal.  Neurological:  Patient is nonverbal, unable to follow commands Involuntary movements noted to right upper extremity  Skin: Skin is warm and dry.  Dressing to abdomen clean dry and intact  Psychiatric:  Unable to test  Nursing note and vitals reviewed.           Vital Signs: BP (!) 146/61 (BP  Location: Right Arm)   Pulse (!) 114   Temp 99.3 F (37.4 C) (Axillary)   Resp (!) 24   Ht 5' 9"  (1.753 m)   Wt 100 kg (220 lb 6.4 oz)   SpO2 100%   BMI 32.55 kg/m  SpO2: SpO2: 100 % O2 Device: O2 Device: Nasal Cannula O2 Flow Rate: O2 Flow Rate (L/min): 2 L/min  Intake/output summary:  Intake/Output Summary (Last 24 hours) at 07/01/17 1658 Last data filed at 07/01/17 1535  Gross per 24 hour  Intake             2675 ml  Output             3150 ml  Net             -475 ml   LBM: Last BM Date: 06/29/17 Baseline Weight: Weight: 100 kg (220 lb 6.4 oz) Most recent weight: Weight: 100 kg (220 lb 6.4 oz)       Palliative Assessment/Data:    Flowsheet Rows     Most Recent Value  Intake Tab  Referral Department  Hospitalist  Unit at Time of Referral  Med/Surg Unit  Palliative Care Primary Diagnosis  Cancer  Date Notified  06/24/17  Palliative Care Type  New Palliative care  Reason for referral  Clarify Goals of Care  Date of Admission  06/23/17  Date first seen by Palliative Care  06/24/17  # of days Palliative referral response time  0 Day(s)  # of days IP prior to Palliative referral  1  Clinical Assessment  Psychosocial & Spiritual Assessment  Palliative Care Outcomes      Patient Active Problem List   Diagnosis Date Noted  . Goals of care, counseling/discussion   . Advance care planning   .  Palliative care by specialist   . Intra-abdominal abscess ([place]) 06/23/2017  . Gastrointestinal hemorrhage with melena   . Hypokalemia   . Severe sepsis with septic shock ([place])   . Peritoneal carcinomatosis ([place])   . Seizure ([place])   . SBO (small bowel obstruction) ([place]) 05/15/2017  . HTN (hypertension) 05/15/2017  . Pulmonary nodule 03/26/2017  . 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/24/2015    Palliative Care Assessment & Plan   Patient Profile: 66 y.o.femalewith past medical history of DM2, seizures, HTN, HLD, GERD, intracranial aneurysm repair (1997), cervical and appendiceal ca (s/p chemotherapy and radiation tx), now with recurrent Stage IV appendiceal adenocarcinoma found during last hospitalization at which time she underwent laparotomy for SBO with perforation and was discharged to SNF in [place] with wound VAC in place. She has not started therapy for recurrent appendiceal carcinoma yet with Dr. [name], but is planning to. She wasadmitted on 7/21/2018with GI bleeding- noting large clots and bleeding per rectum. Workup revealed Hgb of 5.6. CT scan shows 2 fluid collections- ?abscess vs metastatic disease?  Chart reviewed. Pt is not a candidate for further interventions for her metastatic appendiceal ca with carcinomatosis. Now, unfortunately,pt has had an acute change overnight 7/26 and is now non-verbal, only intermittently fc. Prior to 7/26, Palliaitive provider met with patient who was stating despite extensive disease wanted to remain a full code. Pt at this point is unable to make her own decisions, and sons reiterate goal of full scope of treatment   Recommendations/Plan:  Pain: Pt appears uncomfortable and restless. Family now requesting that low dose MS04 be  added back which I did at MS04 40m q3 PRN  Agitation: Pt appears restless and having  involuntary movements of her RUE. A benzodiazapine would be my recommendation for comfort but family is adamant about not giving ativan.  Goals of Care and Additional Recommendations:  Limitations on Scope of Treatment: Full Scope Treatment  Code Status:    Code Status Orders        Start     Ordered   06/23/17 2351  Full code  Continuous     06/23/17 2350    Code Status History    Date Active Date Inactive Code Status Order ID Comments User Context   05/29/2017  4:32 AM 06/08/2017  7:38 PM Full Code [id number] KRise Patience MD ED   05/15/2017  3:47 AM 05/19/2017  3:17 PM Full Code [id number] GEtta Quill DO ED       Prognosis:   I fear pt has very limited time given her advanced, metastatic appendical ca with carcinomatosis, now with worsening neurological status. Family remains steadfast that she would want all measures including life support in the event of acute cardiopulmonary failure.  Discharge Planning:  To Be Determined  Care plan was discussed with Dr. KMaryland Pink Thank you for allowing the Palliative Medicine Team to assist in the care of this patient.   Time In: 1630 Time Out: 1715 Total Time 45 min Prolonged Time Billed  no       Greater than 50%  of this time was spent counseling and coordinating care related to the above assessment and plan.  SDory Horn NP  Please contact Palliative Medicine Team phone at [phone] questions and concerns.

## 2017-07-01 NOTE — Progress Notes (Signed)
Patient BP 160/90 pulse 116. Text paged Dr Hal Hope to let him know that patient has 25mg  of lopressor ordered but she is a high risk for aspiration. Awaiting response

## 2017-07-01 NOTE — Progress Notes (Signed)
RN called concerned about Left arm weakness. Pt had been sleeping most of the shift, woke up only purposefully moving Right arm. Pt not following any commands, does respond to painful stimuli. Pt has a hx of the same per chart, spoke with Dr. Cheral Marker, no new orders given at this time.

## 2017-07-01 NOTE — Progress Notes (Addendum)
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 She was at a skilled nursing facility. She was recently hospitalized for small bowel obstruction and underwent diagnostic laparoscopy in June. She underwent repair of small bowel perforation. She had a prolonged hospital stay. Subsequently was discharged to skilled nursing facility. Presented back with rectal bleeding. Seen by oncology. Not thought to be a candidate for any treatment. Then she had what appears to be a seizure resulting in encephalopathy. Neurology was consulted. Palliative medicine continues to follow.  Reason for Visit: Rectal bleeding  Consultants: Oncology. Palliative medicine. Neurology  Procedures:   EEG Impression EEG is abnormal and findings are suggestive of generalized cerebral dysfunction. Epileptiform features were not seen during this recording.  Antibiotics: Zosyn  Subjective/Interval History: Patient noted to be more delirious today compared to yesterday. Patient's son is at the bedside. Unable to obtain any information from the patient.  ROS: Unable to do at this time  Objective:  Vital Signs  Vitals:   06/30/17 1300 06/30/17 2219 07/01/17 0222 07/01/17 0430  BP: 123/73 (!) 146/76 (!) 153/53 (!) 152/92  Pulse: 99 85 90 (!) 113  Resp: 19 19 20  (!) 22  Temp: 97.6 F (36.4 C) 97.8 F (36.6 C) 98.2 F (36.8 C) 98.5 F (36.9 C)  TempSrc: Oral Oral Oral Axillary  SpO2: 97% 100% 100% 93%  Weight:      Height:        Intake/Output Summary (Last 24 hours) at 07/01/17 0935 Last data filed at 07/01/17 0842  Gross per 24 hour    Intake          1866.25 ml  Output             2550 ml  Net          -683.75 ml   Filed Weights   06/23/17 2347  Weight: 100 kg (220 lb 6.4 oz)    General appearance: Remains delirious Resp: Diminished air entry at the bases. No wheezing. No rales or rhonchi. Cardio: S1S2 is normal, regular. No S3, S4. GI: Patient has an abdominal wall wound from recent surgery. Covered in dressing. Extremities: Edema noted bilateral lower extremities Neurologic: Patient remains delirious. Flail in the right upper extremity. Not moving her left arm.  Lab Results:  Data Reviewed: I have personally reviewed following labs and imaging studies  CBC:  Recent Labs Lab 06/26/17 0628 06/27/17 0422 06/28/17 0423 06/29/17 0420 06/30/17 0439  WBC 12.1* 11.6* 13.6* 15.9* 15.7*  NEUTROABS  --  9.6*  --   --   --   HGB 7.8* 7.7* 8.3* 8.5* 8.1*  HCT 24.3* 24.9* 26.7* 29.5* 27.9*  MCV 85.6 86.8 88.7 93.1 92.4  PLT 366 421* 510* 586* 559*    Basic Metabolic Panel:  Recent Labs Lab 06/27/17 0422 06/27/17 1750 06/28/17 0423 06/29/17 0420 06/30/17 0439  NA 147* 143 144 144 144  K 2.6* 3.3* 4.2 4.9 4.6  CL 111 110 112* 111 112*  CO2 29 27 26 29 29   GLUCOSE 109* 144* 139* 120* 141*  BUN 11 9 7 6 7   CREATININE 1.05* 0.98 0.99 0.88 1.02*  CALCIUM 7.3* 7.4* 7.7* 8.1* 8.1*  MG 1.5*  --   --  1.7  --     GFR: Estimated Creatinine Clearance: 68.3 mL/min (A) (by C-G formula based on SCr of 1.02 mg/dL (H)).  Liver Function Tests:  Recent Labs Lab 06/27/17 0422 06/30/17 0439  AST 14* 12*  ALT 8* 8*  ALKPHOS 90 80  BILITOT 0.5 0.3  PROT 5.1* 5.8*  ALBUMIN 1.1* 1.4*   CBG:  Recent Labs Lab 06/30/17 1600 06/30/17 2012 07/01/17 0030 07/01/17 0432 07/01/17 0801  GLUCAP 89 86 104* 103* 111*     Recent Results (from the past 240 hour(s))  Urine culture     Status: Abnormal   Collection Time: 06/23/17  2:30 PM  Result Value Ref Range Status   Specimen Description URINE, RANDOM   Final   Special Requests NONE  Final   Culture >=100,000 COLONIES/mL ENTEROCOCCUS FAECALIS (A)  Final   Report Status 06/27/2017 FINAL  Final   Organism ID, Bacteria ENTEROCOCCUS FAECALIS (A)  Final      Susceptibility   Enterococcus faecalis - MIC*    AMPICILLIN <=2 SENSITIVE Sensitive     LEVOFLOXACIN 1 SENSITIVE Sensitive     NITROFURANTOIN <=16 SENSITIVE Sensitive     VANCOMYCIN 2 SENSITIVE Sensitive     * >=100,000 COLONIES/mL ENTEROCOCCUS FAECALIS  Culture, blood (routine x 2)     Status: None   Collection Time: 06/24/17 12:50 AM  Result Value Ref Range Status   Specimen Description BLOOD RIGHT HAND  Final   Special Requests   Final    BOTTLES DRAWN AEROBIC AND ANAEROBIC Blood Culture results may not be optimal due to an inadequate volume of blood received in culture bottles   Culture NO GROWTH 5 DAYS  Final   Report Status 06/29/2017 FINAL  Final  Culture, blood (routine x 2)     Status: None   Collection Time: 06/24/17  1:00 AM  Result Value Ref Range Status   Specimen Description BLOOD RIGHT HAND  Final   Special Requests   Final    BOTTLES DRAWN AEROBIC AND ANAEROBIC Blood Culture results may not be optimal due to an inadequate volume of blood received in culture bottles   Culture NO GROWTH 5 DAYS  Final   Report Status 06/29/2017 FINAL  Final      Radiology Studies: No results found.   Medications:  Scheduled: . carbamazepine  150 mg Oral TID  . insulin aspart  0-15 Units Subcutaneous Q4H  . metoprolol tartrate  25 mg Oral BID   Continuous: . ceFEPime (MAXIPIME) IV Stopped (07/01/17 0352)  . dextrose 5 % and 0.45% NaCl 1,000 mL with potassium chloride 20 mEq infusion 75 mL/hr at 07/01/17 0559  . levETIRAcetam Stopped (07/01/17 0056)   BZJ:IRCV & mag hydroxide-simeth, morphine injection, ondansetron **OR** ondansetron (ZOFRAN) IV, sodium chloride flush  Assessment/Plan:  Active Problems:   Primary appendiceal adenocarcinoma ([facility])   HTN (hypertension)    Seizure ([place])   Peritoneal carcinomatosis ([place])   Intra-abdominal abscess ([facility])   Goals of care, counseling/discussion   Advance care planning   Palliative care by specialist   Acute Metabolic Encephalopathy Patient had a sudden change in mental status the afternoon of 7/26. Initial concern was for stroke due to garbled speech. She was seen by neurology. She underwent CT head and MRI brain. No stroke was noted. Subsequently, it was felt that she may have had a seizure. Patient was loaded with Keppra. Started on scheduled intravenous Keppra. EEG did not show any epileptiform activity. MRI did not show any strokes. Neurology  continues to follow. Patient's mental status continues to decline. She is noted to be more delirious now. MRI brain with contrast ordered by neurology. They are also considering lumbar puncture.   Primary appendiceal carcinoma with metastatic disease, stage IV Seen by oncology. Not a candidate for any chemotherapy and is thought to have an incurable condition. Poor prognosis. Palliative medicine is following.  Rectal bleeding of unknown cause. Appears to have subsided. Continue to monitor. This was discussed with general surgery. Patient not a candidate for any endoscopic evaluation due to recent surgery.  History of seizure disorder Patient was on carbamazepine at home. This was not initiated at the time of admission. Was resumed on 7/25. See above regarding possible seizure activity. Being off of carbamazepine could've contributed to this episode, but her episode did occur one day after she was reinitiated on carbamazepine. Neurology is following. Now on Keppra as well. Speech therapy is following. Zosyn was stopped. She was switched over to cefepime and metronidazole. Neurology recommended discontinuing Flagyl as well. Gabapentin has also been discontinued.   Anemia due to acute blood loss. Hemoglobin appears to be stable. Labs are pending this morning.  Abdominal fluid  collection noted on CT scan This was discussed with interventional radiology. All procedures were put on hold. Palliative medicine continues to assist. Patient was on Zosyn, which can lower seizure threshold. So this was changed over to cefepime and metronidazole. Now only on cefepime.  Diabetes mellitus type 2. Continue SSI.  Severe hypokalemia with hypomagnesemia Potassium was repleted aggressively. Patient was also given magnesium. Levels have improved.  Essential hypertension. Continue metoprolol.  Positive urine culture with enterococcus Unclear if she was never symptomatic. Was treated with antibiotics. Should've completed course.  Patient continues to decline. She has poor prognosis is mainly due to her metastatic cancer. Continues to remain encephalopathic with declining mental status. High risk for aspiration. We will change her to NPO status. Discussed with son who was at the bedside who seems to think that her mental status changes are due to morphine. This will be discontinued. Tried to explain to him that her decline is due to her other medical problems including the cancer. He does not want to accept this. Discussed also with palliative medicine provider. They will try to arrange for a family meeting.    ADDENDUM Called by Ms. Bullard with PMT. She had a family meeting. They are still requesting full care. Labs reviewed. IVF to be changed. Discussed with RN. Current vitals reviewed. Patient stable. RN states patient cannot be handled at current floor. Will transfer to stepdown. Requested consultation from critical care medicine.  DVT Prophylaxis: SCDs    Code Status: Full code  Family Communication: Discussed with son who was the bedside. Disposition Plan: Management as outlined above.    LOS: 8 days   [facility] Pager [phone] 07/01/2017, 9:35 AM  If 7PM-7AM, please contact night-coverage at [email or web address], password [facility]

## 2017-07-01 NOTE — Progress Notes (Signed)
SLP Cancellation Note  Patient Details Name: Crytal Pensinger MRN: [id number] DOB: March 24, 1951   Cancelled treatment:       Reason Eval/Treat Not Completed: Medical issues which prohibited therapy. Per RN pt with change in mental status. Currently NPO, not following commands. SLP will f/u when appropriate.  Deneise Lever, [place], [place] Speech-Language Pathologist [place] 07/01/2017, 3:52 PM

## 2017-07-01 NOTE — Progress Notes (Signed)
Received patient from 6N, assessment completed, VS documented, oriented patient to the room.  Will continue to monitor. CHG bath completed, pericare performed, antifungal powder applied to folds and groin for yeast.  Purewick intack, bed pads changed, gown changed.

## 2017-07-01 NOTE — Progress Notes (Signed)
At approximately 0215, patient became more alert and began moving right arm purposefully; however, noticed that patient was not moving left arm. Before this began, patient had been lethargic for most of shift, only responding to voice and painful stimuli by opening eyes. Rapid response was notified of patient's condition. Rapid response came to assess patient. [facility] Schorr was notified as well. Schorr returned call and stated to notify and speak with neurology. Rapid response spoke to [facility] with neurology. [place] gave no additional new orders at the time.

## 2017-07-02 DIAGNOSIS — C799 Secondary malignant neoplasm of unspecified site: Secondary | ICD-10-CM

## 2017-07-02 DIAGNOSIS — Z7189 Other specified counseling: Secondary | ICD-10-CM

## 2017-07-02 DIAGNOSIS — G9340 Encephalopathy, unspecified: Secondary | ICD-10-CM

## 2017-07-02 DIAGNOSIS — E232 Diabetes insipidus: Secondary | ICD-10-CM

## 2017-07-02 LAB — TSH: TSH: 5.283 u[IU]/mL — AB (ref 0.350–4.500)

## 2017-07-02 LAB — URINALYSIS, ROUTINE W REFLEX MICROSCOPIC
BILIRUBIN URINE: NEGATIVE
GLUCOSE, UA: NEGATIVE mg/dL
KETONES UR: NEGATIVE mg/dL
LEUKOCYTES UA: NEGATIVE
Nitrite: NEGATIVE
PH: 8 (ref 5.0–8.0)
Protein, ur: NEGATIVE mg/dL
Specific Gravity, Urine: 1.005 (ref 1.005–1.030)

## 2017-07-02 LAB — GLUCOSE, CAPILLARY
GLUCOSE-CAPILLARY: 139 mg/dL — AB (ref 65–99)
GLUCOSE-CAPILLARY: 100 mg/dL — AB (ref 65–99)
Glucose-Capillary: 89 mg/dL (ref 65–99)
Glucose-Capillary: 99 mg/dL (ref 65–99)
Glucose-Capillary: 135 mg/dL — ABNORMAL HIGH (ref 65–99)

## 2017-07-02 LAB — BASIC METABOLIC PANEL
ANION GAP: 7 (ref 5–15)
BUN: 7 mg/dL (ref 6–20)
CHLORIDE: 111 mmol/L (ref 101–111)
CO2: 32 mmol/L (ref 22–32)
Calcium: 8.6 mg/dL — ABNORMAL LOW (ref 8.9–10.3)
Creatinine, Ser: 1.11 mg/dL — ABNORMAL HIGH (ref 0.44–1.00)
GFR calc non Af Amer: 51 mL/min — ABNORMAL LOW (ref >60)
GFR, EST AFRICAN AMERICAN: 59 mL/min — AB (ref >60)
Glucose, Bld: 104 mg/dL — ABNORMAL HIGH (ref 65–99)
Potassium: 3 mmol/L — ABNORMAL LOW (ref 3.5–5.1)
Sodium: 150 mmol/L — ABNORMAL HIGH (ref 135–145)

## 2017-07-02 LAB — CBC
HEMATOCRIT: 24.3 % — AB (ref 36.0–46.0)
Hemoglobin: 7.2 g/dL — ABNORMAL LOW (ref 12.0–15.0)
MCH: 26.7 pg (ref 26.0–34.0)
MCHC: 29.6 g/dL — ABNORMAL LOW (ref 30.0–36.0)
MCV: 90 fL (ref 78.0–100.0)
Platelets: 398 10*3/uL (ref 150–400)
RBC: 2.7 MIL/uL — AB (ref 3.87–5.11)
RDW: 16.5 % — ABNORMAL HIGH (ref 11.5–15.5)
WBC: 10.4 10*3/uL (ref 4.0–10.5)

## 2017-07-02 LAB — FOLATE: Folate: 4.9 ng/mL — ABNORMAL LOW (ref >5.9)

## 2017-07-02 LAB — OSMOLALITY: Osmolality: 317 mOsm/kg — ABNORMAL HIGH (ref 275–295)

## 2017-07-02 LAB — OSMOLALITY, URINE: Osmolality, Ur: 313 mOsm/kg (ref 300–900)

## 2017-07-02 LAB — VITAMIN B12: VITAMIN B 12: 423 pg/mL (ref 180–914)

## 2017-07-02 LAB — AMMONIA: Ammonia: 30 umol/L (ref 9–35)

## 2017-07-02 MED ORDER — METOPROLOL TARTRATE 5 MG/5ML IV SOLN
5.0000 mg | Freq: Four times a day (QID) | INTRAVENOUS | Status: DC
  Administered 2017-07-02 – 2017-07-18 (×65): 5 mg via INTRAVENOUS
  Filled 2017-07-02 (×66): qty 5

## 2017-07-02 MED ORDER — POTASSIUM CL IN DEXTROSE 5% 20 MEQ/L IV SOLN
20.0000 meq | INTRAVENOUS | Status: DC
  Administered 2017-07-03: 20 meq via INTRAVENOUS
  Filled 2017-07-02 (×3): qty 1000

## 2017-07-02 MED ORDER — POTASSIUM CHLORIDE 10 MEQ/100ML IV SOLN
10.0000 meq | INTRAVENOUS | Status: AC
  Administered 2017-07-02 (×4): 10 meq via INTRAVENOUS
  Filled 2017-07-02 (×4): qty 100

## 2017-07-02 MED ORDER — HYDRALAZINE HCL 20 MG/ML IJ SOLN
10.0000 mg | Freq: Four times a day (QID) | INTRAMUSCULAR | Status: DC | PRN
  Administered 2017-07-02 – 2017-08-04 (×6): 10 mg via INTRAVENOUS
  Filled 2017-07-02 (×7): qty 1

## 2017-07-02 MED ORDER — DESMOPRESSIN ACETATE 4 MCG/ML IJ SOLN
1.0000 ug | Freq: Once | INTRAMUSCULAR | Status: AC
  Administered 2017-07-02: 1 ug via INTRAVENOUS
  Filled 2017-07-02: qty 1

## 2017-07-02 NOTE — Progress Notes (Signed)
Patient ID: Lori [name], female   DOB: 1951-06-24, 66 y.o.   MRN: [id number]   This NP visited patient at the bedside as a follow up for palliative needs and emotional support for family.  Initially meet with son/Shan and then later in the afternoon with large family including sisters, niece and other son [name].  There is no documented HPOA,  both sons are working together with the support of their family.  Son [name] has been the main caregiver and support person as his brother is in [place] for school.  All family understand the seriousness of the situation and overall poor prognosis.    Values and goals of care important to patient and family were attempted to be elicited.  All family reinforce that the patient herself expressed strong feelings regarding "do everything" and "if I die being resuscitated that's ok".  Her son [name] tells me "I made a promise to my mother to honor her wishes and that is what I will do".   Family understand this dynamic and ultimately  will support Marty's decisions for this patient in spite of their own feelings and beliefs.  This is a difficult situation, family will likely maintain the decision for aggressive medical interventions until death.  [name] expresses confusion and frustration with his mother's overall  medical care over the past year ( we thought her cancer was treated); main concern expressed today was family's understanding that "seizure medication was stopped somewhere along the way and we are not sure when or how or why, and this is why she had the seizure".  This NP will look into this question for family.  Detailed the natural  trajectory of recurrent Stage IV metastatic appendiceal adenocarcinoma.  Family is open to all offered and available medical interventions that will offer improvment, they are hopeful that patient will become more cognizant and able to talk with them again.  The difference between a aggressive medical intervention  path  and a palliative comfort care path for this patient at this time was had.   Ultimately all family do not want see she her suffer.    Questions and concerns addressed.   Family encouraged to call with questions or concerns.  PMT will continue to support holistically and will f/u with this family tomorrow.   Time in   1500        Time out    1600   Total time spent on the unit was 60 minutes  Greater than 50% of the time was spent in counseling and coordination of care  Wadie Lessen NP  Palliative Medicine Team Team Phone # [phone] Pager [phone]

## 2017-07-02 NOTE — Progress Notes (Addendum)
Nutrition Follow Up   DOCUMENTATION CODES:   Obesity unspecified  INTERVENTION:    If supportive care desired, rec Cortrak small bore tube placement   Please re-consult RD for TF initiation & management  NUTRITION DIAGNOSIS:   Inadequate oral intake related to altered GI function as evidenced by NPO status, ongoing  GOAL:   Patient will meet greater than or equal to 90% of their needs, unmet  MONITOR:   Diet advancement, Labs, Weight trends, Skin, I & O's  ASSESSMENT:   Donella Pascarella is a 66 y.o. female with medical history significant of DM2, Seizures (not on medication), HTN, HLD, GERD, cervical and appendiceal carcinoma, intracranial aneurysm repair in 1997 who presents for rectal bleeding.   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  Previously on a Dys 2-thin liquid diet. PO intake 0%. Currently NPO. Labs and medications reviewed. Na 150 (H). K 3.0 (L). Palliative Care Team following.  Notes reviewed. CBG's [id number].  Diet Order:  Diet NPO time specified Except for: Sips with Meds  Skin:  Wound (see comment) (closed perineal incision, wound vac to abdominal incision)  Last BM:  7/28  Height:   Ht Readings from Last 1 Encounters:  07/01/17 5\' 8"  (1.727 m)    Weight:   Wt Readings from Last 1 Encounters:  07/02/17 179 lb 0.2 oz (81.2 kg)    Ideal Body Weight:  65.9 kg  BMI:  Body mass index is 27.22 kg/m.  Estimated Nutritional Needs:   Kcal:  1800-2000  Protein:  105-120 grams  Fluid:  > 1.8 L  EDUCATION NEEDS:   Education needs no appropriate at this time  [name] Holms, RD, LDN Pager #: [phone] After-Hours Pager #: [phone]

## 2017-07-02 NOTE — Progress Notes (Signed)
PT Cancellation Note  Patient Details Name: Lori [name] MRN: [id number] DOB: 1951-09-20   Cancelled Treatment:    Reason Eval/Treat Not Completed: Patient's level of consciousness Pt difficult to arouse, thrashing arms when trying to get pt to wake up. Moaning and groaning in the bed. Per RN, agitated last night. Will follow up as appropriate.    Marguarite Arbour A Ed Mandich 07/02/2017, 12:42 PM Wray Kearns, [place], DPT [phone]

## 2017-07-02 NOTE — Progress Notes (Signed)
Subjective: Patient has had no clinical seizures overnight. Continues to remain severely encephalopathic.   Exam: Vitals:   07/02/17 0924 07/02/17 1207  BP: (!) 185/81 (!) 169/107  Pulse:  (!) 110  Resp:  16  Temp:  98.6 F (37 C)   GEN: not alert, moaning HEENT-  Normocephalic,Cardiovascular Heart : pulses palpable throughout     Neuro:   YM:EBRAXE are equal, round and reactive. Forcefully closes eyes when attempts made to open. Cough, oculocephalic reflexes present.   Motor:Does not withdraw to painful stimulus Sensation: Grimaces and withdraws to noxious stimuli in the BUE, right>left DTRs:3+ over left patellar, ankle reflexes.   Coordination: unable to test    Assessment:  This is a 66 year old female with a past medical history significant for metastatic adenocarcinoma of the appendix, CVA with left sided deficits and known seizure disorder was brought in for GIB and subsequently found to have intra-abdominal abscesses. She had a neurological event while not on antiepileptic medication in the hospital. Neurology was consulted for evaluation - favored to be a seizure as CT/MRI both without acute abnormality. She has not had an additional seizure since beginning Keppra, although she continues to be encephalopathic.  Metabolic Encephalopathy Seizures  Recommendations: MRI with and without contrast - shows bilateral subdural hemoatomas,but no enhancement concerning for meningitis. Continue Keppra 1000mg  BID and Tegretol 150 TID Consider stopping cefepime and switching to ceftriaxone Limit sedating/pain medications as agitation allows Correction of metabolic derangements per primary team   We will continue to follow. Please call with any questions.

## 2017-07-02 NOTE — Progress Notes (Signed)
Pharmacy Antibiotic Note  Lori [name] is a 66 y.o. female admitted on 06/23/2017 with suspected intra-abdominal infection.  Today is day # 9 of antibiotic therapy and day # 4 of cefepime. WBC count has improved to 10.4 and patient is afebrile. Scr is up to 1.11 today with CrCl~ 55 ml/min.   Plan: Cefepime 2g IV every 8 hours. Monitor renal function, clinical status, and length of therapy.  Height: 5\' 8"  (172.7 cm) Weight: 179 lb 0.2 oz (81.2 kg) IBW/kg (Calculated) : 63.9  Temp (24hrs), Avg:98.8 F (37.1 C), Min:98 F (36.7 C), Max:99.3 F (37.4 C)   Recent Labs Lab 06/28/17 0423 06/29/17 0420 06/30/17 0439 07/01/17 1453 07/02/17 0320  WBC 13.6* 15.9* 15.7* 11.6* 10.4  CREATININE 0.99 0.88 1.02* 1.09* 1.11*    Estimated Creatinine Clearance: 55.7 mL/min (A) (by C-G formula based on SCr of 1.11 mg/dL (H)).    Allergies  Allergen Reactions  . Contrast Media [Iodinated Diagnostic Agents] Itching and Swelling  . Dilantin [Phenytoin Sodium Extended] Itching, Swelling and Other (See Comments)    Whole body  . Latex Hives and Itching  . Adhesive [Tape] Rash    Antimicrobials this admission: Zosyn 7/22 >>7/26 Cefepime 7/26 >> Flagyl 7/26 >>7/28  Dose adjustments this admission:  Microbiology results: 7/21 UCx: E. Faecalis (Pan Senstive) but w/out sxs 722 BCx: no growth   Thank you for allowing pharmacy to be a part of this patient's care.  Jimmy Footman, PharmD PGY2 Infectious Diseases Pharmacy Resident Pager: [phone] 07/02/2017 10:48 AM

## 2017-07-02 NOTE — Consult Note (Signed)
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 She was d/c to SNF with wound VAC. She returned 7/21 with lower GI bleeding. Course has been c/b likely seizure and worsening encephalopathy.   Of note, pt is not a candidate for any further treatment for her cancer.  Palliative medicine has been following and, despite multiple discussions, family continues to request full code and all aggressive interventions. At one point family requested that MSO4 be stopped as they were concerned that it was contributing to her AMS.  However, there was no improvement without it and they felt she was uncomfortable so it was resumed.     She had persistent encephalopathy even after MSO4 stopped.  MRI of brain repeated 7/29 revealed no stroke but did reveal new small bilateral SDH's without any mass effect.  There was no evidence of metastatic disease. EEG 7/26 did not show any epileptiform activity only generalized cerebral dysfunction.  PAST MEDICAL HISTORY :  She  has a past medical history of Anemia (2017); Arthritis; Carcinoma of appendix ([place]) (04/11/2016); Cervical cancer, FIGO stage IIB (HCC) (05/24/2015); Cervical carcinoma [facility]) (oncologist--  dr Sondra Come for radiation/   [facility] for chemotherapy); Depression; Endometrial carcinoma ([place]); GERD (gastroesophageal reflux disease); Heart murmur; History of blood transfusion; History of cerebral aneurysm repair; History of radiation therapy (07/28/15-08/26/15); History of seizures; Hyperlipidemia; Hypertension; PMB  (postmenopausal bleeding); Pulmonary nodule (03/26/2017); Seizures ([place]); and Type 2 diabetes, diet controlled ([place]).  PAST SURGICAL HISTORY: She  has a past surgical history that includes Craniotomy posterior fossa (1997); PORT-A-CATH PLACEMENT (06/  2016); Tandem ring insertion (N/A, 07/28/2015); Tandem ring insertion (N/A, 08/05/2015); Tandem ring insertion (N/A, 08/11/2015); Tandem ring insertion (N/A, 08/17/2015); Tandem ring insertion (N/A, 08/26/2015); Colonoscopy; laparoscopic appendectomy (N/A, 04/19/2016); laparoscopy (N/A, 05/30/2017); and laparotomy (05/30/2017).  Allergies  Allergen Reactions  . Contrast Media [Iodinated Diagnostic Agents] Itching and Swelling  . Dilantin [Phenytoin Sodium Extended] Itching, Swelling and Other (See Comments)    Whole body  . Latex Hives and Itching  . Adhesive [Tape] Rash    Current Facility-Administered Medications on File Prior to Encounter  Medication  . sodium chloride flush (NS) 0.9 % injection 10 mL   Current Outpatient Prescriptions on File Prior to Encounter  Medication Sig  . alum & mag hydroxide-simeth (MAALOX/MYLANTA) 200-200-20 MG/5ML suspension Take 30 mLs by mouth every 6 (six) hours as needed for indigestion or heartburn.  Marland Kitchen aspirin EC 81 MG tablet Take 81 mg by mouth daily.  . famotidine (PEPCID) 20 MG tablet Take 1 tablet (20 mg total) by mouth daily.  . insulin aspart (NOVOLOG) 100 UNIT/ML injection Inject 0-20 Units into the skin every 4 (four) hours. Glucose 150-200 give 2 units, 201-250 give 4 units, 251-300 give 6 units, 301-350 give 8 units, 351 or greater give 10 units. (Patient taking differently: Inject 0-10 Units into the skin See admin instructions. Inject 0-10 units twice daily (7:30am and 8:30pm) per sliding scale: CBG 0-149 0 units, 150-200 2 units, 201-250 4 units, 251-300 6 units, 301-350 8 units, 351-450 10 units)  . magnesium oxide (  MAG-OX) 400 (241.3 Mg) MG tablet Take 400 mg by mouth daily.   . potassium chloride SA  (K-DUR,KLOR-CON) 20 MEQ tablet Take 20 mEq by mouth daily.   . pravastatin (PRAVACHOL) 20 MG tablet Take 20 mg by mouth at bedtime.   Marland Kitchen acetaminophen (TYLENOL) 500 MG tablet Take 1 tablet (500 mg total) by mouth every 6 (six) hours as needed for moderate pain. (Patient not taking: Reported on 06/23/2017)  . feeding supplement (BOOST / RESOURCE BREEZE) LIQD Take 1 Container by mouth 2 (two) times daily between meals. (Patient not taking: Reported on 06/23/2017)  . feeding supplement, ENSURE ENLIVE, (ENSURE ENLIVE) LIQD Take 237 mLs by mouth 2 (two) times daily between meals. (Patient not taking: Reported on 06/23/2017)    FAMILY HISTORY:  Her indicated that her mother is deceased. She indicated that her father is deceased. She indicated that all of her three sisters are alive. She indicated that both of her brothers are alive.    SOCIAL HISTORY: She  reports that she quit smoking about 21 years ago. Her smoking use included Cigarettes. She has a 1.25 pack-year smoking history. She has never used smokeless tobacco. She reports that she does not drink alcohol or use drugs.  REVIEW OF SYSTEMS:   Unable - as per HPI above obtained from records  SUBJECTIVE:    VITAL SIGNS: BP (!) 185/81   Pulse 84   Temp 98.4 F (36.9 C) (Oral)   Resp 16   Ht 5\' 8"  (1.727 m)   Wt 81.2 kg (179 lb 0.2 oz)   SpO2 93%   BMI 27.22 kg/m   HEMODYNAMICS:    VENTILATOR SETTINGS:    INTAKE / OUTPUT: I/O last 3 completed shifts: In: 2931.3 [I.V.:2451.3; IV Piggyback:480] Out: 4782 [Urine:7450]  PHYSICAL EXAMINATION: General:  Adult female, in NAD. Neuro:  Does not follow commands, moans. HEENT:  [place]/AT. MMM. Cardiovascular:  RRR, no M/R/G. Lungs:  CTAB. Abdomen:  BS x 4, S/NT/ND. Musculoskeletal:  No deformities, no edema. Skin:  Warm, dry.  LABS:  BMET  Recent Labs Lab 06/30/17 0439 07/01/17 1453 07/02/17 0320  NA 144 150* 150*  K 4.6 3.8 3.0*  CL 112* 112* 111  CO2 29 29 32  BUN 7 7 7    CREATININE 1.02* 1.09* 1.11*  GLUCOSE 141* 130* 104*    Electrolytes  Recent Labs Lab 06/27/17 0422  06/29/17 0420 06/30/17 0439 07/01/17 1453 07/02/17 0320  CALCIUM 7.3*  < > 8.1* 8.1* 8.6* 8.6*  MG 1.5*  --  1.7  --   --   --   < > = values in this interval not displayed.  CBC  Recent Labs Lab 06/30/17 0439 07/01/17 1453 07/02/17 0320  WBC 15.7* 11.6* 10.4  HGB 8.1* 7.3* 7.2*  HCT 27.9* 25.2* 24.3*  PLT 559* 448* 398    Coag's No results for input(s): APTT, INR in the last 168 hours.  Sepsis Markers No results for input(s): LATICACIDVEN, PROCALCITON, O2SATVEN in the last 168 hours.  ABG No results for input(s): PHART, PCO2ART, PO2ART in the last 168 hours.  Liver Enzymes  Recent Labs Lab 06/27/17 0422 06/30/17 0439  AST 14* 12*  ALT 8* 8*  ALKPHOS 90 80  BILITOT 0.5 0.3  ALBUMIN 1.1* 1.4*    Cardiac Enzymes No results for input(s): TROPONINI, PROBNP in the last 168 hours.  Glucose  Recent Labs Lab 07/01/17 1149 07/01/17 1618 07/01/17 2018 07/01/17 2345 07/02/17 0331 07/02/17 0723  GLUCAP 101* 117* 126* 123*  56 99    Imaging Mr Brain W Wo Contrast  Result Date: 07/01/2017 CLINICAL DATA:  Encephalopathy. History of seizure disorder and metastatic appendiceal cancer. EXAM: MRI HEAD WITHOUT AND WITH CONTRAST TECHNIQUE: Multiplanar, multiecho pulse sequences of the brain and surrounding structures were obtained without and with intravenous contrast. CONTRAST:  20 mL MultiHance COMPARISON:  06/28/2017 noncontrast brain MRI FINDINGS: The study is severely motion degraded. Brain: There is a 2 mm focus of mildly increased trace diffusion signal and apparently reduced ADC in the dorsal thalamus on axial diffusion imaging, not confirmed on coronal images although assessment is limited by slice selection. No acute infarct is identified elsewhere. Chronic right frontal lobe encephalomalacia is again noted with central calcification. There is also chronic  left cerebellar encephalomalacia. Scattered T2 hyperintensities elsewhere in the cerebral white matter bilaterally are nonspecific but compatible with mild chronic small vessel ischemic disease. There is new abnormal T1 and FLAIR hyperintensity in the extra-axial space overlying the posterior cerebral convexities bilaterally most compatible with thin subdural hematomas measuring 2 mm in thickness each. There is no associated mass effect. A small amount of abnormal sulcal FLAIR hyperintensity at the vertex on the right (series 8, image 24) is favored to reflect artifactual incomplete FLAIR suppression due to regional susceptibility without evidence of subarachnoid hemorrhage elsewhere. There is moderate cerebral atrophy. No abnormal enhancement is identified to suggest metastatic disease. Vascular: Major intracranial vascular flow voids are preserved. Skull and upper cervical spine: Diffusely diminished bone marrow signal intensity likely related to patient's anemia. Sinuses/Orbits: Unremarkable orbits. Similar appearance of partial left frontal sinus opacification. No significant scratched a trace bilateral mastoid fluid. Other: None. IMPRESSION: 1. Severely motion degraded examination. 2. New tiny bilateral subdural hematomas over both posterior cerebral convexities. No mass effect. 3. Minimal sulcal signal abnormality at the right vertex favored to reflect artifact rather than trace subarachnoid hemorrhage. 4. Punctate focus of diffusion abnormality in the dorsal thalamus, favor artifact over tiny infarct. 5. 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 6. No evidence of metastatic disease within limitations of motion. Critical Value/emergent results were called by telephone at the time of interpretation on 07/01/2017 at 2:01 pm to Dr. [place] Pink, who verbally acknowledged these results. Electronically Signed   By: Logan Bores M.D.   On: 07/01/2017 14:05     STUDIES:  MRI brain 7/29 >  New tiny  bilateral subdural hematomas over both posterior cerebral convexities. No mass effect.   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  No evidence of metastatic disease within limitations of motion. EEG 7/26 > generalized cerebral dysfunction, no epileptiform discharges.  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 Course c/b small SDH, seizure, UTI, worsening encephalopathy.  Poor prognosis. Not a candidate for any further cancer treatments.  Family continues to request full scope of treatment.    ASSESSMENT / PLAN:  NEUROLOGIC AMS - worsening. sudden change in mental status the afternoon of 7/26. Initial concern was for stroke due to garbled speech. She was seen by neurology. She underwent CT head and MRI brain. No stroke was noted. Then felt to be possible seizure. EEG did not show seizure activity.  Repeat MRI 7/29 with tiny bilateral SDH's. Seizure  Small SDH  P:   Neurology following  Continue keppra, tegretol.  Despite these having some association with AMS,  doubt this is the case.  Will defer to neuro. 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  INFECTIOUS UTI - enterococcus  Intrabdominal abscess - s/p ex lap with repair of SB perf  P:   Abx as above (cefepime)  ENDOCRINE DM   P:   SSI    FAMILY  - Updates:  Son and aunt updated by Dr. Lamonte Sakai at bedside 7/30.  - Inter-disciplinary family meet or Palliative Care meeting due by:  8/6.   Montey Hora, [facility] Pager: [phone]  or [phone] 07/02/2017, 1:30 PM  Attending Note:  I have examined patient, reviewed labs, studies and notes. I have discussed the case with [place], and I agree with the data and plans as amended above.   66 year old unfortunate woman with a history of seizures, prior CVA, hemorrhagic aneurysms with small subdural hematomas, hypertension, cervical cancer, stage IV appendiceal mucinous adenocarcinoma, recent small bowel obstruction with perforation and intra-abdominal abscess 05/30/17. Readmitted on7/21 with GI bleeding. She experienced apparent seizure activity 7/26 while off antiepileptic medications and on Zosyn. She has remained encephalopathic since that time. She's been started on Tegretol, Keppra, followed by neurology. Also received some Ativan and morphine. EEG did not show any active seizures on 7/26. An MRI of the brain from 7/26 and again 7/29 show possible 2 mm increased trace diffusion signal in the dorsal thalamus but equivocal. No other acute infarct. Probable thin bilateral subdural hematomas without any associated mass effect. She's been on antibiotics for intra-abdominal abscess and also enterococcus in the urine. On my evaluation she is awake and moaning, will not respond to voice, does withdraw from pain, does not open eyes or track. She has adequate airway protection without any secretions. Lungs very distant. Heart regular without M.  Abdomen benign. Trace B LE edema. Labs are significant only for hypernatremia. Etiology of her encephalopathy unclear but it appears to be acute. Defer to neurology as to whether she needs repeat or continuous EEG, lumbar puncture to evaluate for either infection or malignancy. We will start D5W, attempt to correct her hypernatremia. Attempt hold all medications that may cause altered mental status. May need to adjust her antiepileptic regimen also, we will discuss with neurology.  I discussed her current status, prognosis in light of her underlying medical problems with her son and aunt at bedside today. I have stressed that is not clear to me that her encephalopathy is a reversible process. It is possible that this is the presentation of progressive decline due to her malignancy. We will work to try diagnosis and reverse any acute contributor. She  has been clear with family and they're likewise clear that she would want any and all aggressive and invasive interventions in order to address reversible problems. Neither she nor they have considered up to this point that her decline may be irreversible. I have recommended that invasive, life-sustaining care be deferred if we do not believe she has a chance for a meaningful recovery. They're still processing this and are unready to set any limitations on her care at this time. We will need to continue discussions - include her other son as we go forward. She remains Full Code for now. Hopefully her MS will improve.   Independent critical care time is 50 minutes.   Baltazar Apo, MD, PhD 07/02/2017, 3:48 PM [facility] [phone] or if no answer [phone]

## 2017-07-02 NOTE — Progress Notes (Signed)
Notified md of increased urinary output.

## 2017-07-02 NOTE — Progress Notes (Signed)
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 She was at a skilled nursing facility. She was recently hospitalized for small bowel obstruction and underwent diagnostic laparoscopy in June. She underwent repair of small bowel perforation. She had a prolonged hospital stay. Subsequently was discharged to skilled nursing facility. Presented back with rectal bleeding. Seen by oncology. Not thought to be a candidate for any treatment. Then she had what appears to be a seizure resulting in encephalopathy. Neurology was consulted. Palliative medicine continues to follow. Family still desires aggressive care. Patient was transferred to stepdown unit. Critical care medicine was consulted.  Reason for Visit: Rectal bleeding  Consultants: Oncology. Palliative medicine. Neurology. CCM  Procedures:   EEG Impression EEG is abnormal and findings are suggestive of generalized cerebral dysfunction. Epileptiform features were not seen during this recording.  Antibiotics: Zosyn  Subjective/Interval History: Patient remains delirious. Does not open her eyes to voice. Moving her right upper extremity. Seems somewhat agitated. Nursing staff mentions that the patient has been making excessive amounts of urine.  ROS: Unable to do  Objective:  Vital Signs  Vitals:   07/02/17 0500 07/02/17 0600 07/02/17 0720 07/02/17 0924  BP: (!) 162/96 (!) 152/120 (!) 177/94 (!) 185/81  Pulse: (!) 50 (!) 106 84   Resp: 16 18 16    Temp:   98.4 F (36.9 C)   TempSrc:   Oral   SpO2: 97% 96% 93%   Weight:      Height:        Intake/Output Summary  (Last 24 hours) at 07/02/17 1002 Last data filed at 07/02/17 0900  Gross per 24 hour  Intake          1793.75 ml  Output             7350 ml  Net         -5556.25 ml   Filed Weights   06/23/17 2347 07/01/17 2241 07/02/17 0325  Weight: 100 kg (220 lb 6.4 oz) 81.5 kg (179 lb 10.8 oz) 81.2 kg (179 lb 0.2 oz)    General appearance: A shin remains delirious. Resp: Continues to have diminished air entry at the bases. No wheezing, rales or rhonchi. Cardio: S1, S2 is mildly tachycardic. No S3, S4. No rubs, murmurs or bruit GI: Patient has an abdominal wall wound from recent surgery. Covered in dressing. Extremities: Edema noted bilateral lower extremities Neurologic: Continues to remain delirious. Moving her right upper extremity. Does not move her left upper extremity. Seems to be worse over the last 48 hours.  Lab Results:  Data Reviewed: I have personally reviewed following labs and imaging studies  CBC:  Recent Labs Lab 06/27/17 0422 06/28/17 0423 06/29/17 0420 06/30/17 0439 07/01/17 1453 07/02/17 0320  WBC 11.6* 13.6* 15.9* 15.7* 11.6* 10.4  NEUTROABS 9.6*  --   --   --   --   --   HGB 7.7* 8.3* 8.5* 8.1* 7.3* 7.2*  HCT 24.9* 26.7* 29.5* 27.9* 25.2* 24.3*  MCV 86.8 88.7 93.1 92.4 91.0 90.0  PLT 421* 510* 586* 559* 448* 502    Basic Metabolic Panel:  Recent Labs Lab 06/27/17 0422  06/28/17 0423 06/29/17 0420 06/30/17 0439 07/01/17 1453 07/02/17 0320  NA 147*  < >  144 144 144 150* 150*  K 2.6*  < > 4.2 4.9 4.6 3.8 3.0*  CL 111  < > 112* 111 112* 112* 111  CO2 29  < > 26 29 29 29  32  GLUCOSE 109*  < > 139* 120* 141* 130* 104*  BUN 11  < > 7 6 7 7 7   CREATININE 1.05*  < > 0.99 0.88 1.02* 1.09* 1.11*  CALCIUM 7.3*  < > 7.7* 8.1* 8.1* 8.6* 8.6*  MG 1.5*  --   --  1.7  --   --   --   < > = values in this interval not displayed.  GFR: Estimated Creatinine Clearance: 55.7 mL/min (A) (by C-G formula based on SCr of 1.11 mg/dL (H)).  Liver Function Tests:  Recent  Labs Lab 06/27/17 0422 06/30/17 0439  AST 14* 12*  ALT 8* 8*  ALKPHOS 90 80  BILITOT 0.5 0.3  PROT 5.1* 5.8*  ALBUMIN 1.1* 1.4*   CBG:  Recent Labs Lab 07/01/17 1618 07/01/17 2018 07/01/17 2345 07/02/17 0331 07/02/17 0723  GLUCAP 117* 126* 123* 89 99     Recent Results (from the past 240 hour(s))  Urine culture     Status: Abnormal   Collection Time: 06/23/17  2:30 PM  Result Value Ref Range Status   Specimen Description URINE, RANDOM  Final   Special Requests NONE  Final   Culture >=100,000 COLONIES/mL ENTEROCOCCUS FAECALIS (A)  Final   Report Status 06/27/2017 FINAL  Final   Organism ID, Bacteria ENTEROCOCCUS FAECALIS (A)  Final      Susceptibility   Enterococcus faecalis - MIC*    AMPICILLIN <=2 SENSITIVE Sensitive     LEVOFLOXACIN 1 SENSITIVE Sensitive     NITROFURANTOIN <=16 SENSITIVE Sensitive     VANCOMYCIN 2 SENSITIVE Sensitive     * >=100,000 COLONIES/mL ENTEROCOCCUS FAECALIS  Culture, blood (routine x 2)     Status: None   Collection Time: 06/24/17 12:50 AM  Result Value Ref Range Status   Specimen Description BLOOD RIGHT HAND  Final   Special Requests   Final    BOTTLES DRAWN AEROBIC AND ANAEROBIC Blood Culture results may not be optimal due to an inadequate volume of blood received in culture bottles   Culture NO GROWTH 5 DAYS  Final   Report Status 06/29/2017 FINAL  Final  Culture, blood (routine x 2)     Status: None   Collection Time: 06/24/17  1:00 AM  Result Value Ref Range Status   Specimen Description BLOOD RIGHT HAND  Final   Special Requests   Final    BOTTLES DRAWN AEROBIC AND ANAEROBIC Blood Culture results may not be optimal due to an inadequate volume of blood received in culture bottles   Culture NO GROWTH 5 DAYS  Final   Report Status 06/29/2017 FINAL  Final      Radiology Studies: Mr Jeri Cos BL Contrast  Result Date: 07/01/2017 CLINICAL DATA:  Encephalopathy. History of seizure disorder and metastatic appendiceal cancer. EXAM:  MRI HEAD WITHOUT AND WITH CONTRAST TECHNIQUE: Multiplanar, multiecho pulse sequences of the brain and surrounding structures were obtained without and with intravenous contrast. CONTRAST:  20 mL MultiHance COMPARISON:  06/28/2017 noncontrast brain MRI FINDINGS: The study is severely motion degraded. Brain: There is a 2 mm focus of mildly increased trace diffusion signal and apparently reduced ADC in the dorsal thalamus on axial diffusion imaging, not confirmed on coronal images although assessment is limited by slice selection. No acute infarct  is identified elsewhere. Chronic right frontal lobe encephalomalacia is again noted with central calcification. There is also chronic left cerebellar encephalomalacia. Scattered T2 hyperintensities elsewhere in the cerebral white matter bilaterally are nonspecific but compatible with mild chronic small vessel ischemic disease. There is new abnormal T1 and FLAIR hyperintensity in the extra-axial space overlying the posterior cerebral convexities bilaterally most compatible with thin subdural hematomas measuring 2 mm in thickness each. There is no associated mass effect. A small amount of abnormal sulcal FLAIR hyperintensity at the vertex on the right (series 8, image 24) is favored to reflect artifactual incomplete FLAIR suppression due to regional susceptibility without evidence of subarachnoid hemorrhage elsewhere. There is moderate cerebral atrophy. No abnormal enhancement is identified to suggest metastatic disease. Vascular: Major intracranial vascular flow voids are preserved. Skull and upper cervical spine: Diffusely diminished bone marrow signal intensity likely related to patient's anemia. Sinuses/Orbits: Unremarkable orbits. Similar appearance of partial left frontal sinus opacification. No significant scratched a trace bilateral mastoid fluid. Other: None. IMPRESSION: 1. Severely motion degraded examination. 2. New tiny bilateral subdural hematomas over both  posterior cerebral convexities. No mass effect. 3. Minimal sulcal signal abnormality at the right vertex favored to reflect artifact rather than trace subarachnoid hemorrhage. 4. Punctate focus of diffusion abnormality in the dorsal thalamus, favor artifact over tiny infarct. 5. 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 6. No evidence of metastatic disease within limitations of motion. Critical Value/emergent results were called by telephone at the time of interpretation on 07/01/2017 at 2:01 pm to Dr. Maryland Pink, who verbally acknowledged these results. Electronically Signed   By: Logan Bores M.D.   On: 07/01/2017 14:05     Medications:  Scheduled: . carbamazepine  150 mg Oral TID  . insulin aspart  0-15 Units Subcutaneous Q4H  . metoprolol tartrate  5 mg Intravenous Q6H   Continuous: . ceFEPime (MAXIPIME) IV 2 g (07/02/17 0911)  . dextrose 5 % with KCl 20 mEq / L    . levETIRAcetam Stopped (07/02/17 0237)  . potassium chloride 10 mEq (07/02/17 0911)   GEX:BMWU & mag hydroxide-simeth, hydrALAZINE, morphine injection, ondansetron **OR** ondansetron (ZOFRAN) IV, sodium chloride flush  Assessment/Plan:  Active Problems:   Primary appendiceal adenocarcinoma (HCC)   HTN (hypertension)   Seizure ([place])   Peritoneal carcinomatosis ([place])   Intra-abdominal abscess ([place])   Goals of care, counseling/discussion   Advance care planning   Palliative care by specialist   Encephalopathy   Acute Metabolic Encephalopathy Patient had a change in mental status the afternoon of 7/26. Initial concern was for stroke due to garbled speech. She was seen by neurology. She underwent CT head and MRI brain. No stroke was noted. Subsequently, it was felt that she may have had a seizure. Patient was loaded with Keppra. Started on scheduled intravenous Keppra. EEG did not show any epileptiform activity. MRI did not show any strokes. Neurology continues to follow. Patient's mental status continues to  decline. MRI brain with contrast versus a form yesterday. Findings as noted above and include tiny bilateral subdural hematomas without any mass effect. Possible infarct in the thalamus. However, thought to be an artifact. Neurology was also considering lumbar puncture.   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 Check osmolalities. Serum sodium level is elevated. Check UA. Consider desmopressin. Increase D5 infusion.  Primary appendiceal carcinoma with metastatic disease, stage IV Seen by oncology. Not a candidate for any chemotherapy and is thought to have an incurable  condition. Poor prognosis. Palliative medicine is following.  Rectal bleeding of unknown cause. Appears to have subsided. Continue to monitor. This was discussed with general surgery. Patient not a candidate for any endoscopic evaluation due to recent surgery.  History of seizure disorder Patient was on carbamazepine at home. This was not initiated at the time of admission. Was resumed on 7/25. See above regarding possible seizure activity. Being off of carbamazepine could've contributed to this episode, but her episode did occur one day after she was reinitiated on carbamazepine. Neurology is following. Now on Keppra as well. Speech therapy is following. Zosyn was stopped. She was switched over to cefepime and metronidazole. Neurology recommended discontinuing Flagyl as well. Gabapentin has also been discontinued.   Anemia due to acute blood loss. Hemoglobin is low but stable. No evidence for overt bleeding. Transfuse if it drops below 7.  Abdominal fluid collection noted on CT scan This was discussed with interventional radiology. All procedures were put on hold. Palliative medicine continues to assist. Patient was on Zosyn, which can lower seizure threshold. So this was changed over to cefepime and metronidazole. Now only on cefepime.  Diabetes mellitus type 2. Continue  SSI.  Hypokalemia with hypomagnesemia Potassium was repleted aggressively. Patient was also given magnesium. Levels had improved. Noted to be hypokalemic again today. She was given IV potassium.  Essential hypertension. Continue metoprolol. Change to intravenous. Hydralazine as needed. Blood pressure is elevated.  Positive urine culture with enterococcus Unclear if she was never symptomatic. Was treated with antibiotics.   Patient continues to decline. She has poor prognosis is mainly due to her metastatic cancer. Continues to remain encephalopathic with declining mental status. High risk for aspiration. Palliative medicine continues to follow patient. Family still desires aggressive care. Patient was transferred to stepdown unit. Critical care medicine was consulted.   DVT Prophylaxis: SCDs    Code Status: Full code  Family Communication: Discussed with son who was the bedside. Disposition Plan: Management as outlined above.    LOS: 9 days   [facility] Pager [phone] 07/02/2017, 10:02 AM  If 7PM-7AM, please contact night-coverage at [email or web address], password [facility]

## 2017-07-02 NOTE — Progress Notes (Signed)
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 Intra-abdominal abscess secondary to contain small bowel perforation. S/p . 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 2. Exploratory laparotomy, drainage of abdominal abscess, repair of small bowel perforation, repair of small bowel enterotomy, enterocolostomy (mid ileum to mid transverse colon).05/30/17, Dr. Jackolyn Confer. - pt having BM's Code stroke, 06/28/17  - Neurology following  FEN- IV fluids VTE- SCDs, No anticoagulants due to GI bleed ID- Zosyn 7/21-7/27, Flagyl 7/27-7/28, Cefipime  7/27>> Foley: continue Follow up: Dr. [name]  Plan:  Continue wet to dry dressing to the abdominal wound. We will continue to follow.    LOS: 9 days    Subjective:  CC: abdominal wound  Pt is nonverbal. Son at bedside and states pt's mental status remains unchanged. Pt does not open eyes or respond to commands. Will move right arm and moan.  Objective: Vital signs in last 24 hours: Temp:  [98 F (36.7 C)-99.3 F (37.4 C)] 98.4 F (36.9 C) (07/30 0720) Pulse Rate:  [50-120] 84 (07/30 0720) Resp:  [14-25] 16 (07/30 0720) BP: (146-185)/(61-124) 185/81 (07/30 0924) SpO2:  [91 %-100 %] 93 % (07/30 0720) Weight:  [179 lb 0.2 oz (81.2 kg)-179 lb 10.8 oz (81.5 kg)] 179 lb 0.2 oz (81.2 kg) (07/30 0325) Last BM Date: 06/29/17  Intake/Output from previous day: 07/29 0701 - 07/30 0700 In: 1813.8 [I.V.:1493.8; IV Piggyback:320] Out: 8676 [Urine:4900] Intake/Output this shift: Total I/O In: -  Out: 2450 [Urine:2450]  PE: Gen:   Does not open eyes, moves right arm and moans, does not follow commands Pulm:  Rate and effort normal Abd: Soft, not distended, +BS, midline incision with slough and exposed sutures (see photo below) Skin: warm and dry   Midline incision    Anti-infectives: Anti-infectives    Start     Dose/Rate Route Frequency Ordered Stop   06/29/17 1400  ceFEPIme (MAXIPIME) 2 g in dextrose 5 % 50 mL IVPB  Status:  Discontinued     2 g 100 mL/hr over 30 Minutes Intravenous Every 8 hours 06/29/17 0853 06/29/17 0857   06/29/17 1000  ceFEPIme (MAXIPIME) 2 g in dextrose 5 % 50 mL IVPB     2 g 100 mL/hr over 30 Minutes Intravenous Every 8 hours 06/29/17 0857     06/29/17 0930  metroNIDAZOLE (FLAGYL) IVPB 500 mg  Status:  Discontinued     500 mg 100 mL/hr over 60 Minutes Intravenous Every 8 hours 06/29/17 0838 06/30/17 0956   06/24/17 0600  piperacillin-tazobactam (ZOSYN) IVPB 3.375 g  Status:  Discontinued     3.375 g 12.5 mL/hr over 240 Minutes Intravenous Every 8 hours 06/23/17 2254 06/29/17 0838   06/23/17 2300  piperacillin-tazobactam (ZOSYN) IVPB 3.375 g     3.375 g 100 mL/hr over 30 Minutes Intravenous  Once 06/23/17 2254 06/24/17 0056      Lab Results:   Recent Labs  07/01/17 1453 07/02/17 0320  WBC 11.6* 10.4  HGB 7.3* 7.2*  HCT 25.2* 24.3*  PLT 448* 398   BMET  Recent Labs  07/01/17 1453 07/02/17 0320  NA 150* 150*  K 3.8 3.0*  CL 112* 111  CO2  29 32  GLUCOSE 130* 104*  BUN 7 7  CREATININE 1.09* 1.11*  CALCIUM 8.6* 8.6*   PT/INR No results for input(s): LABPROT, INR in the last 72 hours. CMP     Component Value Date/Time   NA 150 (H) 07/02/2017 0320   K 3.0 (L) 07/02/2017 0320   CL 111 07/02/2017 0320   CO2 32 07/02/2017 0320   GLUCOSE 104 (H) 07/02/2017 0320   BUN 7 07/02/2017 0320   CREATININE 1.11 (H) 07/02/2017 0320   CALCIUM 8.6 (L) 07/02/2017 0320   PROT 5.8 (L) 06/30/2017 0439   ALBUMIN 1.4 (L) 06/30/2017 0439   AST 12 (L) 06/30/2017 0439   ALT 8  (L) 06/30/2017 0439   ALKPHOS 80 06/30/2017 0439   BILITOT 0.3 06/30/2017 0439   GFRNONAA 51 (L) 07/02/2017 0320   GFRAA 59 (L) 07/02/2017 0320   Lipase     Component Value Date/Time   LIPASE 31 05/14/2017 2106    Studies/Results: Mr Jeri Cos UU Contrast  Result Date: 07/01/2017 CLINICAL DATA:  Encephalopathy. History of seizure disorder and metastatic appendiceal cancer. EXAM: MRI HEAD WITHOUT AND WITH CONTRAST TECHNIQUE: Multiplanar, multiecho pulse sequences of the brain and surrounding structures were obtained without and with intravenous contrast. CONTRAST:  20 mL MultiHance COMPARISON:  06/28/2017 noncontrast brain MRI FINDINGS: The study is severely motion degraded. Brain: There is a 2 mm focus of mildly increased trace diffusion signal and apparently reduced ADC in the dorsal thalamus on axial diffusion imaging, not confirmed on coronal images although assessment is limited by slice selection. No acute infarct is identified elsewhere. Chronic right frontal lobe encephalomalacia is again noted with central calcification. There is also chronic left cerebellar encephalomalacia. Scattered T2 hyperintensities elsewhere in the cerebral white matter bilaterally are nonspecific but compatible with mild chronic small vessel ischemic disease. There is new abnormal T1 and FLAIR hyperintensity in the extra-axial space overlying the posterior cerebral convexities bilaterally most compatible with thin subdural hematomas measuring 2 mm in thickness each. There is no associated mass effect. A small amount of abnormal sulcal FLAIR hyperintensity at the vertex on the right (series 8, image 24) is favored to reflect artifactual incomplete FLAIR suppression due to regional susceptibility without evidence of subarachnoid hemorrhage elsewhere. There is moderate cerebral atrophy. No abnormal enhancement is identified to suggest metastatic disease. Vascular: Major intracranial vascular flow voids are preserved. Skull  and upper cervical spine: Diffusely diminished bone marrow signal intensity likely related to patient's anemia. Sinuses/Orbits: Unremarkable orbits. Similar appearance of partial left frontal sinus opacification. No significant scratched a trace bilateral mastoid fluid. Other: None. IMPRESSION: 1. Severely motion degraded examination. 2. New tiny bilateral subdural hematomas over both posterior cerebral convexities. No mass effect. 3. Minimal sulcal signal abnormality at the right vertex favored to reflect artifact rather than trace subarachnoid hemorrhage. 4. Punctate focus of diffusion abnormality in the dorsal thalamus, favor artifact over tiny infarct. 5. 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 6. No evidence of metastatic disease within limitations of motion. Critical Value/emergent results were called by telephone at the time of interpretation on 07/01/2017 at 2:01 pm to Dr. Maryland Pink, who verbally acknowledged these results. Electronically Signed   By: Logan Bores M.D.   On: 07/01/2017 14:05      Kalman Drape , [facility] Surgery 07/02/2017, 10:02 AM Pager: [phone] Consults: [phone] Mon-Fri 7:00 am-4:30 pm Sat-Sun 7:00 am-11:30 am

## 2017-07-02 NOTE — Progress Notes (Signed)
SLP Cancellation Note  Patient Details Name: Lori [name] MRN: [id number] DOB: 1951-04-28   Cancelled treatment:       Reason Eval/Treat Not Completed: Medical issues which prohibited therapy. Pt still minimally responsive.    Demetrious Rainford, Katherene Ponto 07/02/2017, 2:50 PM

## 2017-07-03 DIAGNOSIS — K921 Melena: Secondary | ICD-10-CM

## 2017-07-03 LAB — BASIC METABOLIC PANEL
Anion gap: 7 (ref 5–15)
Anion gap: 7 (ref 5–15)
BUN: 7 mg/dL (ref 6–20)
BUN: 7 mg/dL (ref 6–20)
CALCIUM: 8.1 mg/dL — AB (ref 8.9–10.3)
CHLORIDE: 102 mmol/L (ref 101–111)
CO2: 35 mmol/L — AB (ref 22–32)
CO2: 34 mmol/L — ABNORMAL HIGH (ref 22–32)
CREATININE: 0.9 mg/dL (ref 0.44–1.00)
CREATININE: 0.8 mg/dL (ref 0.44–1.00)
Calcium: 7.9 mg/dL — ABNORMAL LOW (ref 8.9–10.3)
Chloride: 100 mmol/L — ABNORMAL LOW (ref 101–111)
GFR calc Af Amer: >60 mL/min (ref >60)
GFR calc Af Amer: >60 mL/min (ref >60)
GFR calc non Af Amer: >60 mL/min (ref >60)
GFR calc non Af Amer: >60 mL/min (ref >60)
Glucose, Bld: 114 mg/dL — ABNORMAL HIGH (ref 65–99)
Glucose, Bld: 149 mg/dL — ABNORMAL HIGH (ref 65–99)
Potassium: 2.4 mmol/L — CL (ref 3.5–5.1)
Potassium: 2.7 mmol/L — CL (ref 3.5–5.1)
SODIUM: 144 mmol/L (ref 135–145)
SODIUM: 141 mmol/L (ref 135–145)

## 2017-07-03 LAB — CBC
HCT: 25.6 % — ABNORMAL LOW (ref 36.0–46.0)
HEMOGLOBIN: 7.9 g/dL — AB (ref 12.0–15.0)
MCH: 27.2 pg (ref 26.0–34.0)
MCHC: 30.9 g/dL (ref 30.0–36.0)
MCV: 88.3 fL (ref 78.0–100.0)
Platelets: 406 10*3/uL — ABNORMAL HIGH (ref 150–400)
RBC: 2.9 MIL/uL — ABNORMAL LOW (ref 3.87–5.11)
RDW: 16.7 % — AB (ref 11.5–15.5)
WBC: 10.3 10*3/uL (ref 4.0–10.5)

## 2017-07-03 LAB — GLUCOSE, CAPILLARY
GLUCOSE-CAPILLARY: 129 mg/dL — AB (ref 65–99)
GLUCOSE-CAPILLARY: 139 mg/dL — AB (ref 65–99)
Glucose-Capillary: 92 mg/dL (ref 65–99)
Glucose-Capillary: 109 mg/dL — ABNORMAL HIGH (ref 65–99)
Glucose-Capillary: 141 mg/dL — ABNORMAL HIGH (ref 65–99)
Glucose-Capillary: 110 mg/dL — ABNORMAL HIGH (ref 65–99)

## 2017-07-03 LAB — T4, FREE: FREE T4: 1.45 ng/dL — AB (ref 0.61–1.12)

## 2017-07-03 LAB — MAGNESIUM: MAGNESIUM: 1 mg/dL — AB (ref 1.7–2.4)

## 2017-07-03 MED ORDER — POTASSIUM CHLORIDE 10 MEQ/100ML IV SOLN
INTRAVENOUS | Status: AC
  Filled 2017-07-03: qty 100

## 2017-07-03 MED ORDER — POTASSIUM CHLORIDE 10 MEQ/100ML IV SOLN
10.0000 meq | INTRAVENOUS | Status: AC
  Administered 2017-07-03 (×6): 10 meq via INTRAVENOUS

## 2017-07-03 MED ORDER — MAGNESIUM SULFATE 2 GM/50ML IV SOLN
2.0000 g | Freq: Once | INTRAVENOUS | Status: AC
  Administered 2017-07-03: 2 g via INTRAVENOUS
  Filled 2017-07-03: qty 50

## 2017-07-03 MED ORDER — SODIUM CHLORIDE 0.9 % IV SOLN
3.0000 g | Freq: Four times a day (QID) | INTRAVENOUS | Status: DC
  Administered 2017-07-03 – 2017-07-04 (×3): 3 g via INTRAVENOUS
  Filled 2017-07-03 (×5): qty 3

## 2017-07-03 MED ORDER — POTASSIUM CHLORIDE 2 MEQ/ML IV SOLN
INTRAVENOUS | Status: DC
  Administered 2017-07-03 – 2017-07-17 (×11): via INTRAVENOUS
  Filled 2017-07-03 (×25): qty 1000

## 2017-07-03 MED ORDER — DESMOPRESSIN ACETATE 4 MCG/ML IJ SOLN
1.0000 ug | Freq: Once | INTRAMUSCULAR | Status: AC
  Administered 2017-07-03: 1 ug via INTRAVENOUS
  Filled 2017-07-03: qty 0.25

## 2017-07-03 MED ORDER — POTASSIUM CHLORIDE 10 MEQ/100ML IV SOLN
10.0000 meq | INTRAVENOUS | Status: AC
  Administered 2017-07-03 (×6): 10 meq via INTRAVENOUS
  Filled 2017-07-03 (×3): qty 100

## 2017-07-03 MED ORDER — FOLIC ACID 5 MG/ML IJ SOLN
1.0000 mg | Freq: Every day | INTRAMUSCULAR | Status: DC
  Administered 2017-07-03 – 2017-07-10 (×7): 1 mg via INTRAVENOUS
  Filled 2017-07-03 (×8): qty 0.2

## 2017-07-03 NOTE — Care Management Note (Addendum)
Case Management Note  Patient Details  Name: Lori [name] MRN: [id number] Date of Birth: [birth year]  Subjective/Objective:   Stage 4 metastatic cancer of the appendix with GIBm SDH, UTI, worsening encephalopathy, intra abd abscess.  She conts to decline.  Family desires aggressive care, iv abx, d5, keppra iv, anti htn iv, plan is for SNF.  Discussed in LOS 7/31.   8/7 [name] RN, BSN- Conts on iv keppra, iv abx. cortrak tube feeds, plan will be to return to SNF.  CSW aware.                  Action/Plan: NCM will follow along with CSW for dc needs.  Expected Discharge Date:                  Expected Discharge Plan:  Skilled Nursing Facility  In-House Referral:  Clinical Social Work  Discharge planning Services     Post Acute Care Choice:    Choice offered to:     DME Arranged:    DME Agency:     HH Arranged:    [place] Agency:     Status of Service:  In process, will continue to follow  If discussed at Long Length of Stay Meetings, dates discussed:    Additional Comments:  Zenon Mayo, RN 07/03/2017, 5:39 PM

## 2017-07-03 NOTE — Progress Notes (Signed)
SLP Cancellation Note  Patient Details Name: Lori [name] MRN: [id number] DOB: 1951/06/27   Cancelled treatment:       Reason Eval/Treat Not Completed: Medical issues which prohibited therapy. Pt remain unresponsive. Please reorder when mentation is appropriate for SLP interventions.    Davarion Cuffee, Katherene Ponto 07/03/2017, 11:48 AM

## 2017-07-03 NOTE — Progress Notes (Signed)
Pharmacy Antibiotic Note  Lori [name] is a 66 y.o. female admitted on 06/23/2017 with intraadominal infection.  Pharmacy has been consulted for Unasyn dosing. She is on Day #11 of total antibiotic coverage for her intraabdominal abscesses. Due to lack of clinical improvement coverage for her Enterococcus in her urine is desired. Discussed with CCM - will change antibiotics to Unasyn.  Plan: Unasyn 3g IV q6h Monitor renal function Follow available micro data Follow for antibiotic length of therapy  Height: 5\' 8"  (172.7 cm) Weight: 179 lb 0.2 oz (81.2 kg) IBW/kg (Calculated) : 63.9  Temp (24hrs), Avg:98.8 F (37.1 C), Min:97.7 F (36.5 C), Max:99.4 F (37.4 C)   Recent Labs Lab 06/29/17 0420 06/30/17 0439 07/01/17 1453 07/02/17 0320 07/03/17 0513  WBC 15.9* 15.7* 11.6* 10.4 10.3  CREATININE 0.88 1.02* 1.09* 1.11* 0.90    Estimated Creatinine Clearance: 68.7 mL/min (by C-G formula based on SCr of 0.9 mg/dL).    Allergies  Allergen Reactions  . Contrast Media [Iodinated Diagnostic Agents] Itching and Swelling  . Dilantin [Phenytoin Sodium Extended] Itching, Swelling and Other (See Comments)    Whole body  . Latex Hives and Itching  . Adhesive [Tape] Rash    Antimicrobials this admission: Zosyn 7/22 >>7/26 Cefepime 7/26 >>7/31 Flagyl 7/26 >> 7/28 Unasyn 7/31>>  7/21 UCx: E. Faecalis (Pan Senstive) but w/out sxs 722 BCx: ngf  Thank you for allowing pharmacy to be a part of this patient's care.  Norva Riffle 07/03/2017 10:48 AM

## 2017-07-03 NOTE — Care Management Important Message (Signed)
Important Message  Patient Details  Name: Lori [name] MRN: [id number] Date of Birth: May 03, 1951   Medicare Important Message Given:  Yes    [name] May 07/03/2017, 9:36 AM

## 2017-07-03 NOTE — Progress Notes (Signed)
PT Cancellation Note  Patient Details Name: Lori [name] MRN: [id number] DOB: 07-17-1951   Cancelled Treatment:    Reason Eval/Treat Not Completed: Lethargy limiting ability to participate.    [place] 07/03/2017, 4:41 PM [employer] PT [phone]

## 2017-07-03 NOTE — Progress Notes (Signed)
CRITICAL VALUE ALERT  Critical Value:  K- 2.4  Date & Time Notied:  0629 7/31  Provider Notified:Kirby, NP  Orders Received/Actions taken: 6 runs of IV K given.

## 2017-07-03 NOTE — Progress Notes (Signed)
CRITICAL VALUE ALERT  Critical Value:  Potassium 2.7  Date & Time Notied:  07/03/17 @ 5462  Provider Notified: Curly Rim, MD  Orders Received/Actions taken: Order for 6 runs pf IV potassium.

## 2017-07-03 NOTE — Progress Notes (Signed)
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 She was d/c to SNF with wound VAC. She returned 7/21 with lower GI bleeding. Course has been c/b likely seizure and worsening encephalopathy.   Of note, pt is not a candidate for any further treatment for her cancer.  Palliative medicine has been following and, despite multiple discussions, family continues to request full code and all aggressive interventions. At one point family requested that MSO4 be stopped as they were concerned that it was contributing to her AMS.  However, there was no improvement without it and they felt she was uncomfortable so it was resumed.     She had persistent encephalopathy even after MSO4 stopped.  MRI of brain repeated 7/29 revealed no stroke but did reveal new small bilateral SDH's without any mass effect.  There was no evidence of metastatic disease. EEG 7/26 did not show any epileptiform activity only generalized cerebral dysfunction.   SUBJECTIVE: No acute events.  Remains essentially unresponsive. Family met with palliative care, still want to pursue aggressive measures / full code.  Appears that enterococcal UTI has not been appropriately treated (has only been on zosyn and cefepime so far).   VITAL SIGNS: BP (!) 149/68 (BP Location: Left Arm)   Pulse (!) 59   Temp 99.2 F (37.3 C) (Axillary)   Resp 12   Ht _0  (1.727 m)   Wt 81.2 kg (179 lb 0.2 oz)   SpO2 96%   BMI 27.22 kg/m   HEMODYNAMICS:    VENTILATOR SETTINGS:    INTAKE / OUTPUT: I/O last 3  completed shifts: In: 4070 [I.V.:3400; IV Piggyback:670] Out: 10750 [DXIPJ:82505]  PHYSICAL EXAMINATION: General:  Adult female, in NAD. Neuro:  Does not wake up, Does not follow commands, moans. HEENT:  [place]/AT. MMM. Cardiovascular:  RRR, no M/R/G. Lungs:  CTAB. Abdomen:  BS x 4, S/NT/ND. Musculoskeletal:  No deformities, no edema. Skin:  Warm, dry.  LABS:  BMET  Recent Labs Lab 07/01/17 1453 07/02/17 0320 07/03/17 0513  NA 150* 150* 144  K 3.8 3.0* 2.4*  CL 112* 111 102  CO2 29 32 35*  BUN _1 CREATININE 1.09* 1.11* 0.90  GLUCOSE 130* 104* 114*    Electrolytes  Recent Labs Lab 06/27/17 0422  06/29/17 0420  07/01/17 1453 07/02/17 0320 07/03/17 0513  CALCIUM 7.3*  < > 8.1*  < > 8.6* 8.6* 8.1*  MG 1.5*  --  1.7  --   --   --   --   < > = values in this interval not displayed.  CBC  Recent Labs Lab 07/01/17 1453 07/02/17 0320 07/03/17 0513  WBC 11.6* 10.4 10.3  HGB 7.3* 7.2* 7.9*  HCT 25.2* 24.3* 25.6*  PLT 448* 398 406*    Coag's No results for input(s): APTT, INR in the last 168 hours.  Sepsis Markers No results for input(s): LATICACIDVEN, PROCALCITON, O2SATVEN in the last 168 hours.  ABG No results for input(s): PHART, PCO2ART, PO2ART in the last 168 hours.  Liver Enzymes  Recent Labs Lab 06/27/17 0422 06/30/17 3976  AST 14* 12*  ALT 8* 8*  ALKPHOS 90 80  BILITOT 0.5 0.3  ALBUMIN 1.1* 1.4*    Cardiac Enzymes No results for input(s): TROPONINI, PROBNP in the last 168 hours.  Glucose  Recent Labs Lab 07/02/17 1209 07/02/17 1620 07/02/17 1955 07/02/17 2357 07/03/17 0445 07/03/17 0720  GLUCAP 139* 100* 135* 139* 92 109*    Imaging No results found.   STUDIES:  MRI brain 7/29 >  New tiny bilateral subdural hematomas over both posterior cerebral convexities. No mass effect.   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  No evidence of metastatic disease within limitations of motion. EEG 7/26 >  generalized cerebral dysfunction, no epileptiform discharges.  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 Course c/b small SDH, seizure, UTI, worsening encephalopathy.  Poor prognosis. Not a candidate for any further cancer treatments.  Family continues to request full scope of treatment.    ASSESSMENT / PLAN:  NEUROLOGIC AMS - worsening. sudden change in mental status the afternoon of 7/26. Initial concern was for stroke due to garbled speech. She was seen by neurology. She underwent CT head and MRI brain. No stroke was noted. Then felt to be possible seizure. EEG did not show seizure activity.  Repeat MRI 7/29 with tiny bilateral SDH's.  Folate slightly low, TSH slightly high.  Additional labs including ammonia, B12, CBG normal. Seizure  Small SDH  P:   Neurology following  Continue keppra, tegretol.  Despite these having some association with AMS, doubt this is the case.  Will defer to neuro. Defer LP to neuro, agree may not be high yield given no enhancements on brain MRI. Start 69m folic acid.  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  INFECTIOUS UTI - enterococcus - unclear whether adequately treated (appears she has only been treated with cefepime thus far). Intrabdominal abscess - s/p ex lap with repair of SB perf  P:   Abx as above for intrabdominal (cefepime) Add ampicillin for UTI  ENDOCRINE DM   ? Hypothyroidism - TSH slightly elevated. P:   SSI  Assess free T4, T3.  If low then start synthroid.   FAMILY  - Updates:  Son and aunt updated by Dr. BLamonte Sakaiat bedside 7/30.  No family at bedside 7/31.  - Inter-disciplinary family meet or Palliative Care meeting due by:  8/6.   RMontey Hora PU[facility] RogerPulmonary & Critical Care Medicine Pager: ([phone] or ([phone]/31/2018, 8:34 AM   STAFF NOTE: I, DMerrie Roof MD FACP have personally reviewed patient's available  data, including medical history, events of note, physical examination and test results as part of my evaluation. I have discussed with resident/NP and other care providers such as pharmacist, RN and RRT. In addition, I personally evaluated patient and elicited key findings of:  Eyes int open, not fc, moaning, coughing well, lungs clear, neck no stridor, abdo soft, remains with unclear encephalopathy, no indication for ETT now and would be futile and allow her to suffer, recommend continued pall care discussions, IF LP needed, pccm can provide, LP decisions per neuro, would follow pcxr for atx risk and asp risk, O2 as needed, replace lytes, it is possible enteroccu is contributing to encpeh, ua not impressive would add amp and treat course, no family at bedside  Lavon Paganini. Titus Mould, MD, [place] Pgr: [facility] 07/03/2017 12:57 PM

## 2017-07-03 NOTE — Plan of Care (Signed)
Problem: Education: Goal: Knowledge of [facility] Education information/materials will improve Outcome: Progressing Patient unable to participate in education, family at bedside  Problem: Health Behavior/Discharge Planning: Goal: Ability to manage health-related needs will improve Outcome: Not Progressing Condition unchanged, minimally responsive and unable to care for own health related needs.  Problem: Physical Regulation: Goal: Ability to maintain clinical measurements within normal limits will improve Outcome: Progressing VSS, potassium low, replacing potassium IV. Goal: Will remain free from infection Outcome: Progressing IV antibiotics administered (see MAR)  Problem: Skin Integrity: Goal: Risk for impaired skin integrity will decrease Outcome: Progressing Unable to assist with turns, turned q2 hrs  Problem: Tissue Perfusion: Goal: Risk factors for ineffective tissue perfusion will decrease Outcome: Progressing SCDs in place  Problem: Activity: Goal: Risk for activity intolerance will decrease Outcome: Not Progressing Moans with movement/turns, does not initiate movement in bed.  Problem: Fluid Volume: Goal: Ability to maintain a balanced intake and output will improve Outcome: Progressing Urine output had been increased, but is better at this time after receiving DDAVP on previous shift.  Problem: Nutrition: Goal: Adequate nutrition will be maintained Outcome: Not Progressing No feeding tube and not alert to take POs. Nutrition needs to be addressed. Fluids with D5 and potassium ordered and q4hr blood sugar checks.  Problem: Bowel/Gastric: Goal: Will not experience complications related to bowel motility Outcome: Not Progressing Patient is having loose incontinent stools.

## 2017-07-03 NOTE — Progress Notes (Addendum)
[facility] PROGRESS NOTE  Margrit Minner DGU:[id number] DOB: 19-Jan-1951 DOA: 06/23/2017  PCP: [name], MD  Brief History/Interval Summary: 66 year old female with a past medical history of diabetes, seizure disorder, hypertension, history of brain aneurysm, history of cervical cancer, 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 She was at a skilled nursing facility. She was recently hospitalized for small bowel obstruction and underwent diagnostic laparoscopy in June. She underwent repair of small bowel perforation. She had a prolonged hospital stay. Subsequently was discharged to skilled nursing facility. Presented back with rectal bleeding. Seen by oncology. Not thought to be a candidate for any treatment. Then she had what appears to be a seizure resulting in encephalopathy. Neurology was consulted. Palliative medicine continues to follow. Family still desires aggressive care. Patient was transferred to stepdown unit. Critical care medicine was consulted. Patient noted to have profuse urine output. 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  Consultants: Oncology. Palliative medicine. Neurology. CCM  Procedures:   EEG Impression EEG is abnormal and findings are suggestive of generalized cerebral dysfunction. Epileptiform features were not seen during this recording.  Antibiotics: Zosyn  Subjective/Interval History: Patient remains delirious. Eyes are closed. Does not follow any commands.   ROS: Unable to do  Objective:  Vital Signs  Vitals:   07/03/17 0400 07/03/17 0500 07/03/17 0600 07/03/17 0721  BP: (!) 174/92 (!) 154/83 135/60 (!) 149/68  Pulse: 62   (!) 59  Resp: 16   12  Temp: 97.7 F (36.5 C)   99.2 F (37.3 C)  TempSrc: Oral   Axillary  SpO2: 96%   96%  Weight:      Height:        Intake/Output Summary (Last 24 hours) at 07/03/17 0930 Last data filed at 07/03/17 0723  Gross  per 24 hour  Intake             3085 ml  Output             5850 ml  Net            -2765 ml   Filed Weights   06/23/17 2347 07/01/17 2241 07/02/17 0325  Weight: 100 kg (220 lb 6.4 oz) 81.5 kg (179 lb 10.8 oz) 81.2 kg (179 lb 0.2 oz)    General appearance: Patient remains delirious Resp: Diminished air entry at the bases. No wheezing, rales or rhonchi Cardio: S1, S2 is normal, regular. No S3, S4. No rubs, murmurs or bruit GI: Patient has an abdominal wall wound from recent surgery. Covered in dressing. Extremities: Mild Edema noted bilateral lower extremities Neurologic: Continues to remain delirious. Moving her right upper extremity. Does not move her left upper extremity. Seems to be worse over the last 48 hours.  Lab Results:  Data Reviewed: I have personally reviewed following labs and imaging studies  CBC:  Recent Labs Lab 06/27/17 0422  06/29/17 0420 06/30/17 0439 07/01/17 1453 07/02/17 0320 07/03/17 0513  WBC 11.6*  < > 15.9* 15.7* 11.6* 10.4 10.3  NEUTROABS 9.6*  --   --   --   --   --   --   HGB 7.7*  < > 8.5* 8.1* 7.3* 7.2* 7.9*  HCT 24.9*  < > 29.5* 27.9* 25.2* 24.3* 25.6*  MCV 86.8  < > 93.1 92.4 91.0 90.0 88.3  PLT 421*  < > 586* 559* 448* 398 406*  < > = values in this interval not displayed.  Basic Metabolic Panel:  Recent  Labs Lab 06/27/17 0422  06/29/17 0420 06/30/17 0439 07/01/17 1453 07/02/17 0320 07/03/17 0513  NA 147*  < > 144 144 150* 150* 144  K 2.6*  < > 4.9 4.6 3.8 3.0* 2.4*  CL 111  < > 111 112* 112* 111 102  CO2 29  < > 29 29 29  32 35*  GLUCOSE 109*  < > 120* 141* 130* 104* 114*  BUN 11  < > 6 7 7 7 7   CREATININE 1.05*  < > 0.88 1.02* 1.09* 1.11* 0.90  CALCIUM 7.3*  < > 8.1* 8.1* 8.6* 8.6* 8.1*  MG 1.5*  --  1.7  --   --   --   --   < > = values in this interval not displayed.  GFR: Estimated Creatinine Clearance: 68.7 mL/min (by C-G formula based on SCr of 0.9 mg/dL).  Liver Function Tests:  Recent Labs Lab 06/27/17 0422  06/30/17 0439  AST 14* 12*  ALT 8* 8*  ALKPHOS 90 80  BILITOT 0.5 0.3  PROT 5.1* 5.8*  ALBUMIN 1.1* 1.4*   CBG:  Recent Labs Lab 07/02/17 1620 07/02/17 1955 07/02/17 2357 07/03/17 0445 07/03/17 0720  GLUCAP 100* 135* 139* 92 109*     Recent Results (from the past 240 hour(s))  Urine culture     Status: Abnormal   Collection Time: 06/23/17  2:30 PM  Result Value Ref Range Status   Specimen Description URINE, RANDOM  Final   Special Requests NONE  Final   Culture >=100,000 COLONIES/mL ENTEROCOCCUS FAECALIS (A)  Final   Report Status 06/27/2017 FINAL  Final   Organism ID, Bacteria ENTEROCOCCUS FAECALIS (A)  Final      Susceptibility   Enterococcus faecalis - MIC*    AMPICILLIN <=2 SENSITIVE Sensitive     LEVOFLOXACIN 1 SENSITIVE Sensitive     NITROFURANTOIN <=16 SENSITIVE Sensitive     VANCOMYCIN 2 SENSITIVE Sensitive     * >=100,000 COLONIES/mL ENTEROCOCCUS FAECALIS  Culture, blood (routine x 2)     Status: None   Collection Time: 06/24/17 12:50 AM  Result Value Ref Range Status   Specimen Description BLOOD RIGHT HAND  Final   Special Requests   Final    BOTTLES DRAWN AEROBIC AND ANAEROBIC Blood Culture results may not be optimal due to an inadequate volume of blood received in culture bottles   Culture NO GROWTH 5 DAYS  Final   Report Status 06/29/2017 FINAL  Final  Culture, blood (routine x 2)     Status: None   Collection Time: 06/24/17  1:00 AM  Result Value Ref Range Status   Specimen Description BLOOD RIGHT HAND  Final   Special Requests   Final    BOTTLES DRAWN AEROBIC AND ANAEROBIC Blood Culture results may not be optimal due to an inadequate volume of blood received in culture bottles   Culture NO GROWTH 5 DAYS  Final   Report Status 06/29/2017 FINAL  Final      Radiology Studies: Mr Jeri Cos BJ Contrast  Result Date: 07/01/2017 CLINICAL DATA:  Encephalopathy. History of seizure disorder and metastatic appendiceal cancer. EXAM: MRI HEAD WITHOUT AND  WITH CONTRAST TECHNIQUE: Multiplanar, multiecho pulse sequences of the brain and surrounding structures were obtained without and with intravenous contrast. CONTRAST:  20 mL MultiHance COMPARISON:  06/28/2017 noncontrast brain MRI FINDINGS: The study is severely motion degraded. Brain: There is a 2 mm focus of mildly increased trace diffusion signal and apparently reduced ADC in the dorsal thalamus  on axial diffusion imaging, not confirmed on coronal images although assessment is limited by slice selection. No acute infarct is identified elsewhere. Chronic right frontal lobe encephalomalacia is again noted with central calcification. There is also chronic left cerebellar encephalomalacia. Scattered T2 hyperintensities elsewhere in the cerebral white matter bilaterally are nonspecific but compatible with mild chronic small vessel ischemic disease. There is new abnormal T1 and FLAIR hyperintensity in the extra-axial space overlying the posterior cerebral convexities bilaterally most compatible with thin subdural hematomas measuring 2 mm in thickness each. There is no associated mass effect. A small amount of abnormal sulcal FLAIR hyperintensity at the vertex on the right (series 8, image 24) is favored to reflect artifactual incomplete FLAIR suppression due to regional susceptibility without evidence of subarachnoid hemorrhage elsewhere. There is moderate cerebral atrophy. No abnormal enhancement is identified to suggest metastatic disease. Vascular: Major intracranial vascular flow voids are preserved. Skull and upper cervical spine: Diffusely diminished bone marrow signal intensity likely related to patient's anemia. Sinuses/Orbits: Unremarkable orbits. Similar appearance of partial left frontal sinus opacification. No significant scratched a trace bilateral mastoid fluid. Other: None. IMPRESSION: 1. Severely motion degraded examination. 2. New tiny bilateral subdural hematomas over both posterior cerebral  convexities. No mass effect. 3. Minimal sulcal signal abnormality at the right vertex favored to reflect artifact rather than trace subarachnoid hemorrhage. 4. Punctate focus of diffusion abnormality in the dorsal thalamus, favor artifact over tiny infarct. 5. 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 6. No evidence of metastatic disease within limitations of motion. Critical Value/emergent results were called by telephone at the time of interpretation on 07/01/2017 at 2:01 pm to Dr. [place] Pink, who verbally acknowledged these results. Electronically Signed   By: Logan Bores M.D.   On: 07/01/2017 14:05     Medications:  Scheduled: . carbamazepine  150 mg Oral TID  . desmopressin  1 mcg Intravenous Once  . folic acid  1 mg Intravenous Daily  . insulin aspart  0-15 Units Subcutaneous Q4H  . metoprolol tartrate  5 mg Intravenous Q6H   Continuous: . ceFEPime (MAXIPIME) IV Stopped (07/03/17 0310)  . dextrose 5 % with kcl    . levETIRAcetam Stopped (07/03/17 0232)  . potassium chloride 10 mEq (07/03/17 0857)   IDP:OEUM & mag hydroxide-simeth, hydrALAZINE, ondansetron **OR** ondansetron (ZOFRAN) IV, sodium chloride flush  Assessment/Plan:  Active Problems:   Primary appendiceal adenocarcinoma (HCC)   HTN (hypertension)   Seizure ([place])   Peritoneal carcinomatosis ([place])   Intra-abdominal abscess ([place])   DNR (do not resuscitate) discussion   Advance care planning   Palliative care by specialist   Encephalopathy   Metastatic cancer ([place])   Acute Metabolic Encephalopathy Patient had a change in mental status the afternoon of 7/26. Initial concern was for stroke due to garbled speech. She was seen by neurology. She underwent CT head and MRI brain. No stroke was noted. Subsequently, it was felt that she may have had a seizure. Patient was loaded with Keppra. Started on scheduled intravenous Keppra. EEG did not show any epileptiform activity. MRI did not show any strokes.  Neurology continues to follow. Patient's mental status continues to decline. MRI brain with contrast was also done and findings as noted above and include tiny bilateral subdural hematomas without any mass effect. Per neurology this does not require any intervention. Possible infarct in the thalamus. However, thought to be an artifact. Neurology was also considering lumbar puncture. No further workup planned by neurology.  Diabetes insipidus  Patient with  excessive urine output. 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 Sodium level was elevated. Serum osmolality high as 100. It lower than serum osmolality. She was given 1 dose of DDAVP. Continues to have high urine output. Sodium level noted to be 144 today. She'll be given an additional dose this morning. Labs will be repeated this afternoon. If urine output slows down, then she may not require further doses. Cut back on D5 water infusion.   Primary appendiceal carcinoma with metastatic disease, stage IV Seen by oncology. Not a candidate for any chemotherapy and is thought to have an incurable condition. Poor prognosis. Palliative medicine is following.  Rectal bleeding of unknown cause. Appears to have subsided. Continue to monitor. This was discussed with general surgery. Patient not a candidate for any endoscopic evaluation due to recent surgery.  History of seizure disorder Patient was on carbamazepine at home. This was not initiated at the time of admission. Was resumed on 7/25. See above regarding possible seizure activity. Being off of carbamazepine could've contributed to this episode, but her episode did occur one day after she was reinitiated on carbamazepine. Neurology is following. Now on Keppra as well. Speech therapy is following. Zosyn was stopped. She was switched over to cefepime and metronidazole. Neurology recommended discontinuing Flagyl as well. Gabapentin has also been discontinued.   Anemia due to acute  blood loss. Hemoglobin is low but stable. No evidence for overt bleeding. Transfuse if it drops below 7.  Abdominal fluid collection noted on CT scan This was discussed with interventional radiology. All procedures were put on hold. Palliative medicine continues to assist. Patient was on Zosyn, which can lower seizure threshold. So this was changed over to cefepime and metronidazole. Now only on cefepime.  Diabetes mellitus type 2. Continue SSI.  Hypokalemia with hypomagnesemia/hypernatremia Potassium was repleted aggressively. Potassium level had improved. Now low again. This will be aggressively repleted. Magnesium also noted to be low at 1.0. This will be supplemented as well. Increase the potassium in the IV fluids. Sodium level has improved. Recheck labs later today.  Essential hypertension. Continue intravenous metoprolol. Hydralazine as needed. Blood pressure remains elevated.  Positive urine culture with enterococcus Unclear if she was never symptomatic. She was getting Zosyn, which should have covered.   Nutrition Patient with significant encephalopathy the last 2 days. Hasn't been able to take orally. If there is no significant change in next 24-48 hrs will need to consider tube feedings if family wishes. May need to place cortrack. This is ok with general surgery if need be.  Patient continues to decline. She has poor prognosis is mainly due to her metastatic cancer. Continues to remain encephalopathic with declining mental status. High risk for aspiration. Palliative medicine continues to follow patient. Family still desires aggressive care. Patient was transferred to stepdown unit. Critical care medicine was consulted.   DVT Prophylaxis: SCDs    Code Status: Full code  Family Communication: Discussed with son who was the bedside. Disposition Plan: Management as outlined above.    LOS: 10 days   [facility] Pager [phone] 07/03/2017, 9:30 AM  If  7PM-7AM, please contact night-coverage at [email or web address], password [facility]

## 2017-07-03 NOTE — Progress Notes (Addendum)
Subjective:   Examined patient. Patient stable. Awake, visually tracks to voice. Does not follow commands, grimaces to pain.  No other events/ no episodes concerning for seizures. Patient no receiving tegretol as no IV formulary exists and dobhoff tube not placed.  However primary team felt she had become more lethargic.    Exam: Vitals:   07/03/17 1900 07/03/17 2026  BP: (!) 144/85 (!) 157/84  Pulse: 63 85  Resp: 16 19  Temp:  99.1 F (37.3 C)   GEN: not alert, moaning HEENT-  Normocephalic Heart : pulses palpable throughout     Neuro:  WL:NLGXQJ are equal, round and reactive. Eyes open, tracks to voice.  Motor: withdraws to painful stimulus Sensation:Grimaces  to noxious stimuli  DTRs:3+ over left patellar, ankle reflexes.   Coordination: unable to test    Assessment:  This is a 66 year old female with a past medical history significant for metastatic adenocarcinoma of the appendix, CVA with left sided deficits and known seizure disorder was brought in for GIB and subsequently found to have intra-abdominal abscesses. She had a neurological event while not on antiepileptic medication in the hospital. Neurology was consulted for evaluation - favored to be a seizure as CT/MRI both without acute abnormality. She has not had an additional seizure since beginning Keppra, although she continues to be encephalopathic.  Metabolic and Septic Encephalopathy Seizures Metastatic Cancer stage IV Diabetes insipidus Rectal bleeding and anemia UTI and intrabdominal abscesses Bilateral small SDH  Recommendations Continue IV Keppra for seizure prophylaxis.  Continue to treat infection and metabolic conditions  Consider Repeat EEG if mental status worsens, however currently stable on my exam.  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

## 2017-07-04 ENCOUNTER — Encounter (HOSPITAL_COMMUNITY): Payer: Self-pay | Admitting: *Deleted

## 2017-07-04 ENCOUNTER — Inpatient Hospital Stay (HOSPITAL_COMMUNITY): Payer: Medicare Other

## 2017-07-04 LAB — GLUCOSE, CAPILLARY
GLUCOSE-CAPILLARY: 116 mg/dL — AB (ref 65–99)
GLUCOSE-CAPILLARY: 136 mg/dL — AB (ref 65–99)
Glucose-Capillary: 100 mg/dL — ABNORMAL HIGH (ref 65–99)
Glucose-Capillary: 114 mg/dL — ABNORMAL HIGH (ref 65–99)
Glucose-Capillary: 116 mg/dL — ABNORMAL HIGH (ref 65–99)
Glucose-Capillary: 94 mg/dL (ref 65–99)
Glucose-Capillary: 98 mg/dL (ref 65–99)

## 2017-07-04 LAB — BASIC METABOLIC PANEL
Anion gap: 5 (ref 5–15)
BUN: 5 mg/dL — AB (ref 6–20)
CHLORIDE: 99 mmol/L — AB (ref 101–111)
CO2: 31 mmol/L (ref 22–32)
Calcium: 7.3 mg/dL — ABNORMAL LOW (ref 8.9–10.3)
Creatinine, Ser: 0.73 mg/dL (ref 0.44–1.00)
GFR calc Af Amer: >60 mL/min (ref >60)
GFR calc non Af Amer: >60 mL/min (ref >60)
GLUCOSE: 141 mg/dL — AB (ref 65–99)
POTASSIUM: 3 mmol/L — AB (ref 3.5–5.1)
Sodium: 135 mmol/L (ref 135–145)

## 2017-07-04 LAB — CBC
HEMATOCRIT: 25.7 % — AB (ref 36.0–46.0)
HEMOGLOBIN: 7.9 g/dL — AB (ref 12.0–15.0)
MCH: 26.9 pg (ref 26.0–34.0)
MCHC: 30.7 g/dL (ref 30.0–36.0)
MCV: 87.4 fL (ref 78.0–100.0)
Platelets: 376 10*3/uL (ref 150–400)
RBC: 2.94 MIL/uL — ABNORMAL LOW (ref 3.87–5.11)
RDW: 16.8 % — AB (ref 11.5–15.5)
WBC: 10.6 10*3/uL — ABNORMAL HIGH (ref 4.0–10.5)

## 2017-07-04 LAB — T3: T3 TOTAL: 87 ng/dL (ref 71–180)

## 2017-07-04 LAB — MAGNESIUM: MAGNESIUM: 1.3 mg/dL — AB (ref 1.7–2.4)

## 2017-07-04 MED ORDER — DAKINS (1/2 STRENGTH) 0.25 % EX SOLN
Freq: Two times a day (BID) | CUTANEOUS | Status: AC
  Administered 2017-07-04 – 2017-07-06 (×6)
  Administered 2017-07-07: 1
  Administered 2017-07-07: 09:00:00
  Administered 2017-07-08: 1
  Administered 2017-07-08: 09:00:00
  Filled 2017-07-04: qty 473

## 2017-07-04 MED ORDER — SODIUM CHLORIDE 0.9 % IV SOLN
1.0000 ug | Freq: Once | INTRAVENOUS | Status: AC
  Administered 2017-07-04: 1 ug via INTRAVENOUS
  Filled 2017-07-04: qty 0.25

## 2017-07-04 MED ORDER — CARBAMAZEPINE 100 MG PO CHEW
200.0000 mg | CHEWABLE_TABLET | Freq: Once | ORAL | Status: AC
  Administered 2017-07-04: 200 mg via ORAL
  Filled 2017-07-04 (×2): qty 2

## 2017-07-04 MED ORDER — JEVITY 1.2 CAL PO LIQD
1000.0000 mL | ORAL | Status: DC
  Administered 2017-07-04 – 2017-07-08 (×6): 1000 mL
  Administered 2017-07-09: 16:00:00
  Administered 2017-07-10 – 2017-07-13 (×5): 1000 mL
  Filled 2017-07-04 (×16): qty 1000

## 2017-07-04 MED ORDER — CLONIDINE HCL 0.1 MG/24HR TD PTWK
0.1000 mg | MEDICATED_PATCH | TRANSDERMAL | Status: DC
  Administered 2017-07-04 – 2017-07-18 (×3): 0.1 mg via TRANSDERMAL
  Filled 2017-07-04 (×3): qty 1

## 2017-07-04 MED ORDER — PRO-STAT SUGAR FREE PO LIQD
30.0000 mL | Freq: Three times a day (TID) | ORAL | Status: DC
  Administered 2017-07-04 – 2017-07-11 (×21): 30 mL
  Administered 2017-07-12: 21:00:00
  Administered 2017-07-12 – 2017-07-17 (×13): 30 mL
  Filled 2017-07-04 (×41): qty 30

## 2017-07-04 MED ORDER — SODIUM CHLORIDE 0.9 % IV SOLN
3.0000 g | Freq: Four times a day (QID) | INTRAVENOUS | Status: AC
  Administered 2017-07-04 – 2017-07-10 (×24): 3 g via INTRAVENOUS
  Filled 2017-07-04 (×25): qty 3

## 2017-07-04 MED ORDER — POTASSIUM CHLORIDE 20 MEQ PO PACK
30.0000 meq | PACK | Freq: Four times a day (QID) | ORAL | Status: AC
  Administered 2017-07-04 – 2017-07-05 (×3): 30 meq via NASOGASTRIC
  Filled 2017-07-04 (×3): qty 2

## 2017-07-04 NOTE — Progress Notes (Signed)
Nutrition Brief Note  Verbal with Read Back order received per Dr. Jonnie Finner for TF initiation & management. Cortrak tube in place, tip in stomach. Will start Jevity 1.2 formula at 15 ml/hr and increase by 10 ml every 12 hours to goal rate of 55 ml/hr. Prostat liquid protein 30 ml TID via tube. Total TF regimen to provide 1740 kcals, 118 gm protein, 1065 ml of free water.  Estimated Nutritional Needs:   Kcal:  1800-2000  Protein:  105-120 grams  Fluid:  > 1.8 L  [name] Holms, RD, LDN Pager #: [phone] After-Hours Pager #: [phone]

## 2017-07-04 NOTE — Progress Notes (Signed)
PT Cancellation Note  Patient Details Name: Lori [name] MRN: [id number] DOB: 1951/08/22   Cancelled Treatment:    Reason Eval/Treat Not Completed: Patient at procedure or test/unavailable, to CT.   Duncan Dull 07/04/2017, 9:18 AM Alben Deeds, PT DPT  Board Certified Neurologic Specialist [phone]

## 2017-07-04 NOTE — Progress Notes (Signed)
Patient ID: Lori [name], female   DOB: May 11, 1951, 66 y.o.   MRN: [id number]  [facility] Surgery Progress Note     Subjective: CC- abdominal wound Family at bedside. Just returned from CT. No improvement in mental status, patient does not open eyes or respond.  Objective: Vital signs in last 24 hours: Temp:  [98.7 F (37.1 C)-99.4 F (37.4 C)] 98.8 F (37.1 C) (08/01 0827) Pulse Rate:  [48-86] 71 (08/01 0827) Resp:  [8-25] 17 (08/01 0827) BP: (140-165)/(67-100) 159/82 (08/01 0854) SpO2:  [91 %-100 %] 96 % (08/01 0827) Last BM Date: 07/03/17  Intake/Output from previous day: 07/31 0701 - 08/01 0700 In: 3623.3 [I.V.:2363.3; IV Piggyback:1260] Out: 2103 [Urine:2100; Stool:3] Intake/Output this shift: No intake/output data recorded.  PE: Gen:  NAD, nonverbal, moaning HEENT: eyes closed Card:  RRR, no M/G/R heard Pulm:  CTAB, no W/R/R, effort normal Skin: no rashes noted, warm and dry Abd: Soft, NT/ND, +BS, midline incision with significant slough, no purulent drainage or surrounding erythema     Lab Results:   Recent Labs  07/03/17 0513 07/04/17 0510  WBC 10.3 10.6*  HGB 7.9* 7.9*  HCT 25.6* 25.7*  PLT 406* 376   BMET  Recent Labs  07/03/17 1554 07/04/17 0510  NA 141 135  K 2.7* 3.0*  CL 100* 99*  CO2 34* 31  GLUCOSE 149* 141*  BUN 7 5*  CREATININE 0.80 0.73  CALCIUM 7.9* 7.3*   PT/INR No results for input(s): LABPROT, INR in the last 72 hours. CMP     Component Value Date/Time   NA 135 07/04/2017 0510   K 3.0 (L) 07/04/2017 0510   CL 99 (L) 07/04/2017 0510   CO2 31 07/04/2017 0510   GLUCOSE 141 (H) 07/04/2017 0510   BUN 5 (L) 07/04/2017 0510   CREATININE 0.73 07/04/2017 0510   CALCIUM 7.3 (L) 07/04/2017 0510   PROT 5.8 (L) 06/30/2017 0439   ALBUMIN 1.4 (L) 06/30/2017 0439   AST 12 (L) 06/30/2017 0439   ALT 8 (L) 06/30/2017 0439   ALKPHOS 80 06/30/2017 0439   BILITOT 0.3 06/30/2017 0439   GFRNONAA >60 07/04/2017 0510   GFRAA  >60 07/04/2017 0510   Lipase     Component Value Date/Time   LIPASE 31 05/14/2017 2106       Studies/Results: No results found.  Anti-infectives: Anti-infectives    Start     Dose/Rate Route Frequency Ordered Stop   07/04/17 1100  Ampicillin-Sulbactam (UNASYN) 3 g in sodium chloride 0.9 % 100 mL IVPB     3 g 200 mL/hr over 30 Minutes Intravenous Every 6 hours 07/04/17 0823 07/10/17 1059   07/03/17 1600  Ampicillin-Sulbactam (UNASYN) 3 g in sodium chloride 0.9 % 100 mL IVPB  Status:  Discontinued     3 g 200 mL/hr over 30 Minutes Intravenous Every 6 hours 07/03/17 1047 07/04/17 0823   06/29/17 1400  ceFEPIme (MAXIPIME) 2 g in dextrose 5 % 50 mL IVPB  Status:  Discontinued     2 g 100 mL/hr over 30 Minutes Intravenous Every 8 hours 06/29/17 0853 06/29/17 0857   06/29/17 1000  ceFEPIme (MAXIPIME) 2 g in dextrose 5 % 50 mL IVPB  Status:  Discontinued     2 g 100 mL/hr over 30 Minutes Intravenous Every 8 hours 06/29/17 0857 07/03/17 1046   06/29/17 0930  metroNIDAZOLE (FLAGYL) IVPB 500 mg  Status:  Discontinued     500 mg 100 mL/hr over 60 Minutes Intravenous Every 8 hours  06/29/17 0838 06/30/17 0956   06/24/17 0600  piperacillin-tazobactam (ZOSYN) IVPB 3.375 g  Status:  Discontinued     3.375 g 12.5 mL/hr over 240 Minutes Intravenous Every 8 hours 06/23/17 2254 06/29/17 0838   06/23/17 2300  piperacillin-tazobactam (ZOSYN) IVPB 3.375 g     3.375 g 100 mL/hr over 30 Minutes Intravenous  Once 06/23/17 2254 06/24/17 0056       Assessment/Plan Diabetes insipidus Seizure disorder Lower GI bleed - resolved Anemia - Hg stable DM-II HTN ?UTI Acute metabolic encephalopathy - possible seizure, neurology following, CT pending  Primary appendiceal carcinoma with metastatic disease, stage IV  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 [name]: 1. Diagnostic laparoscopy with peritoneal nodule biopsy (consistent with metastatic  adenocarcinoma on frozen section).  2.  Exploratory laparotomy, drainage of abdominal abscess, repair of small bowel perforation, repair of small bowel enterotomy, enterocolostomy (mid ileum to mid transverse colon). - having BM's - BID wet to dry dressing changes to midline abdominal wound  FEN- IVF, NPO, TF (once Cortrak placed) VTE- SCDs, No anticoagulants due to anemia ID- Zosyn 7/21>>7/27, Flagyl 7/27>>7/28, Cefipime 7/27>>7/27, Unasyn 7/31>>   Plan: Continue BID wet to dry dressing to the abdominal wound; will order Dakins solution to use x5 days. Patient getting Cortrak due to inability to take in food PO due to altered mental status.   LOS: 11 days    Wellington Hampshire , [facility] Surgery 07/04/2017, 9:13 AM Pager: [phone] Consults: [phone] Mon-Fri 7:00 am-4:30 pm Sat-Sun 7:00 am-11:30 am

## 2017-07-04 NOTE — Progress Notes (Signed)
Cortrak Tube Team Note:  Consult received to place a Cortrak feeding tube.   A 10 F Cortrak tube was placed in the R nare and secured with a nasal bridle at 72 cm. Per the Cortrak monitor reading the tube tip is in the stomach.   No x-ray is required. RN may begin using tube.    If the tube becomes dislodged please keep the tube and contact the Cortrak team at [email or web address] ([email or web address]) for replacement.  If after hours and replacement cannot be delayed, place a NG tube and confirm placement with an abdominal x-ray.     Jarome Matin, MS, RD, LDN, [facility] Inpatient Clinical Dietitian Pager # [phone] After hours/weekend pager # [phone]

## 2017-07-04 NOTE — Progress Notes (Signed)
Subjective:  Primary team physician  witnessed episode concerning for seizures. Patient continues to be lethargic, a little worse compared to yesterday as she is not having her eyes open or tracking.    Exam: Vitals:   07/04/17 0827 07/04/17 0854  BP:  (!) 159/82  Pulse: 71   Resp: 17   Temp: 98.8 F (37.1 C)      Exam:     Vitals:   07/03/17 1900 07/03/17 2026  BP: (!) 144/85 (!) 157/84  Pulse: 63 85  Resp: 16 19  Temp:  99.1 F (37.3 C)   GEN: not alert, moaning HEENT- Normocephalic Heart : pulses palpable throughout    Neuro:  CV:ELFYBO are equal, eyes closes  Motor: withdraws to painful stimulus Sensation:Grimaces  to noxious stimuli  DTRs:3+ over left patellar, ankle reflexes.  Coordination: unable to test    Assessment:  This is a 66 year old female with a past medical history significant for metastatic adenocarcinoma of the appendix, CVA with left sided deficits and known seizure disorder was brought in for GIB and subsequently found to have intra-abdominal abscesses. She was coded "stroke"  on 07/04/23 increased lethargy, garbled speech and a facial droop. Neurology was consulted for evaluation - favored to be a seizure as CT/MRI both without acute abnormality ( MRI did show restricted diffusion in left thalamus thought to be artifactual). EEG on 7/27 did not show epileptiform activity.     Metabolic and Septic Encephalopathy Seizures Metastatic Cancer stage IV Diabetes insipidus Rectal bleeding and anemia UTI and intrabdominal abscesses Bilateral small SDH  Recommendations Continue IV Keppra for seizure prophylaxis.  Continue to treat infection and metabolic conditions  Prolonged EEG to r/o subclinical status    Repeat CT head to r/o worsening SDH.

## 2017-07-04 NOTE — Progress Notes (Signed)
Patient ID: Lori [name], female   DOB: June 20, 1951, 66 y.o.   MRN: [id number]  This NP visited patient at the bedside as a follow up to previous [place], for palliative medicine needs and emotional support from her family.  89 the patient's son and main decision maker was present at bedside.  Questions and concerns addressed. More details me his understanding that a cord tract will be placed on lumbar puncture will be performed and an overnight EEG will be completed. He remains hopeful for improvement for his mother.    Verbalized my concern for her multiple comorbidities and high risk for decompensation. Supported Lori [name] in his hope for his mother's improvement.  Discussed with patient the importance of continued conversation with family and their  medical providers regarding overall plan of care and treatment options,  ensuring decisions are within the context of the patients values and GOCs.  Palliative medicine team will continue to support holistically.  Time in  9:00 AM      Time out  9:35 AM      Total time spent on the unit was 35 minutes    Discussed with Dr Jonnie Finner   Greater than 50% of the time was spent in counseling and coordination of care  Lori Lessen NP  Palliative Medicine Team Team Phone # [phone] Pager [phone]

## 2017-07-04 NOTE — Progress Notes (Signed)
vEEG LTM running. No skin breakdown with hookup. Tested event button. Notified neuro

## 2017-07-04 NOTE — Progress Notes (Addendum)
[facility] Team 11 Progress Note  Subjective: son [name] at bedside, concerned that pt was dc'd too soon from [facility] on 7/6.  Per the son after that she went to SNF, then to ER and admitted at [facility] for a bit then dc'd to another SNF, then readmitted here on 7/21.  Per son she was mentally "fine" and at baseline cared for herself.  Then while here had neuro decline here on 7/26 w/ slurred speech, facial and AMS.  Per neuro diagnosis is favored to be seizures.  She had hx seizure d/o but her seizure meds had not been continued on admission here. STarted on Keppra and restarted on po Tegretol.  Has not had further seizures here , but remains encephalopathic.    UOP down to 2.1 L from 7.7 L the day before.  Got DDAVP 1 ug at 10 am yest.   Vitals:   07/04/17 0436 07/04/17 0500 07/04/17 0600 07/04/17 0700  BP: (!) 148/77 (!) 144/89 (!) 145/99 (!) 165/67  Pulse: 86 (!) 58 74 (!) 54  Resp: 14 13 (!) 8 (!) 25  Temp: 98.8 F (37.1 C)     TempSrc: Oral     SpO2: 97% 100% 94% 99%  Weight:      Height:        Inpatient medications: . carbamazepine  150 mg Oral TID  . folic acid  1 mg Intravenous Daily  . insulin aspart  0-15 Units Subcutaneous Q4H  . metoprolol tartrate  5 mg Intravenous Q6H   . ampicillin-sulbactam (UNASYN) IV Stopped (07/04/17 0530)  . dextrose 5 % with kcl 100 mL/hr at 07/03/17 2215  . levETIRAcetam Stopped (07/04/17 0243)   alum & mag hydroxide-simeth, hydrALAZINE, ondansetron **OR** ondansetron (ZOFRAN) IV, sodium chloride flush  Exam: Obtunded, no verbal response. Groans from time to time and has upper body/ head movements but not following commadns No jvd Chest rhonchi R chest, L clear' RRR no mrg aBd open wound packed mid abd 7x 12 cm, no purulence or odor, dec'd BS Ext no LE or UE edema R upper chest port is accessed         Impression: 1  AMS - hx of seizures, per neuro event on 7/26 prob seizures. Imaging showed no new CVA, old changes R frontal and L  cerebellar. Did have small bilat SDH, but no enhancement to suggest meningitis. No mets on MRI +/- contrast. On Keppra IV and tegretol po, but she has only rec'd po tegretol 7/25- 7/28 as she hasn't been taking pills since then.  Not improving and neuro defers LP for carcinomatous meningitis to primary team. D/W neurology, given bilat SDH, even though small, may ^complications from LP, so will repeat head CT first.  They will repeat EEG as well as pt did have some seizure-like activity in the room this am while I was here.  Have d/w son.   2  Peritoneal mets/ appendix Ca stage IV - w recent SBO/ perf viscous sp repair. Has open wound w/ looks clean and is being packed. Not candidate for any chemoRx.  3  Polyuria - possible DI.  Will cont DDAVP, seems to be helping.  4  Nutrition - needs feeding tube, not able to swallow w/ MS changes 5  DM cont SSI 6  Seizure d/o - cont IV Keppra, and po tegretol when NG in 7  HTN - will add Clon patch 0.1 8  Enterococcus UTI - d/w ID, will give Unasyn (or augmentin when  NG in) for 7 d total 9  DVT proph - SCD's  Plan - as above   Kelly Splinter MD [facility] pgr [phone] 07/05/2017, 7:31 PM   Recent Labs Lab 07/03/17 0513 07/03/17 1554 07/04/17 0510  NA 144 141 135  K 2.4* 2.7* 3.0*  CL 102 100* 99*  CO2 35* 34* 31  GLUCOSE 114* 149* 141*  BUN 7 7 5*  CREATININE 0.90 0.80 0.73  CALCIUM 8.1* 7.9* 7.3*    Recent Labs Lab 06/30/17 0439  AST 12*  ALT 8*  ALKPHOS 80  BILITOT 0.3  PROT 5.8*  ALBUMIN 1.4*    Recent Labs Lab 07/02/17 0320 07/03/17 0513 07/04/17 0510  WBC 10.4 10.3 10.6*  HGB 7.2* 7.9* 7.9*  HCT 24.3* 25.6* 25.7*  MCV 90.0 88.3 87.4  PLT 398 406* 376   Iron/TIBC/Ferritin/ %Sat    Component Value Date/Time   IRON 13 (L) 06/24/2017 1739   TIBC 106 (L) 06/24/2017 1739   FERRITIN 680 (H) 06/24/2017 1739   IRONPCTSAT 12 06/24/2017 1739

## 2017-07-05 DIAGNOSIS — R40241 Glasgow coma scale score 13-15, unspecified time: Secondary | ICD-10-CM

## 2017-07-05 DIAGNOSIS — I6300 Cerebral infarction due to thrombosis of unspecified precerebral artery: Secondary | ICD-10-CM

## 2017-07-05 DIAGNOSIS — C181 Malignant neoplasm of appendix: Secondary | ICD-10-CM

## 2017-07-05 DIAGNOSIS — E876 Hypokalemia: Secondary | ICD-10-CM

## 2017-07-05 DIAGNOSIS — R188 Other ascites: Secondary | ICD-10-CM

## 2017-07-05 LAB — BASIC METABOLIC PANEL
Anion gap: 5 (ref 5–15)
BUN: 11 mg/dL (ref 6–20)
CHLORIDE: 102 mmol/L (ref 101–111)
CO2: 30 mmol/L (ref 22–32)
CREATININE: 0.78 mg/dL (ref 0.44–1.00)
Calcium: 7.1 mg/dL — ABNORMAL LOW (ref 8.9–10.3)
GFR calc Af Amer: >60 mL/min (ref >60)
GFR calc non Af Amer: >60 mL/min (ref >60)
GLUCOSE: 123 mg/dL — AB (ref 65–99)
POTASSIUM: 3.5 mmol/L (ref 3.5–5.1)
SODIUM: 137 mmol/L (ref 135–145)

## 2017-07-05 LAB — PROTIME-INR
INR: 1.62
Prothrombin Time: 19.4 seconds — ABNORMAL HIGH (ref 11.4–15.2)

## 2017-07-05 LAB — APTT: APTT: 47 s — AB (ref 24–36)

## 2017-07-05 LAB — CBC
HEMATOCRIT: 26.3 % — AB (ref 36.0–46.0)
Hemoglobin: 8 g/dL — ABNORMAL LOW (ref 12.0–15.0)
MCH: 27 pg (ref 26.0–34.0)
MCHC: 30.4 g/dL (ref 30.0–36.0)
MCV: 88.9 fL (ref 78.0–100.0)
Platelets: 353 10*3/uL (ref 150–400)
RBC: 2.96 MIL/uL — ABNORMAL LOW (ref 3.87–5.11)
RDW: 17 % — AB (ref 11.5–15.5)
WBC: 10.7 10*3/uL — AB (ref 4.0–10.5)

## 2017-07-05 LAB — GLUCOSE, CAPILLARY
GLUCOSE-CAPILLARY: 125 mg/dL — AB (ref 65–99)
GLUCOSE-CAPILLARY: 103 mg/dL — AB (ref 65–99)
GLUCOSE-CAPILLARY: 115 mg/dL — AB (ref 65–99)
Glucose-Capillary: 115 mg/dL — ABNORMAL HIGH (ref 65–99)
Glucose-Capillary: 90 mg/dL (ref 65–99)

## 2017-07-05 LAB — CARBAMAZEPINE LEVEL, TOTAL: Carbamazepine Lvl: 4.2 ug/mL (ref 4.0–12.0)

## 2017-07-05 NOTE — Progress Notes (Signed)
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 She was d/c to SNF with wound VAC. She returned 7/21 with lower GI bleeding. Course has been c/b likely seizure and worsening encephalopathy.   Of note, pt is not a candidate for any further treatment for her cancer.  Palliative medicine has been following and, despite multiple discussions, family continues to request full code and all aggressive interventions. At one point family requested that MSO4 be stopped as they were concerned that it was contributing to her AMS.  However, there was no improvement without it and they felt she was uncomfortable so it was resumed.     She had persistent encephalopathy even after MSO4 stopped.  MRI of brain repeated 7/29 revealed no stroke but did reveal new small bilateral SDH's without any mass effect.  There was no evidence of metastatic disease. EEG 7/26 did not show any epileptiform activity only generalized cerebral dysfunction.   SUBJECTIVE:  Lethargic She is protecting airway   VITAL SIGNS: BP 128/73 (BP Location: Left Arm)   Pulse 81   Temp 99.3 F (37.4 C) (Axillary)   Resp 19   Ht 5\' 8"  (1.727 m)   Wt 198 lb 13.7 oz (90.2 kg)   SpO2 97%   BMI 30.24 kg/m   HEMODYNAMICS:    VENTILATOR SETTINGS:    INTAKE / OUTPUT: I/O last 3 completed shifts: In: 3725.3 [I.V.:2141; NG/GT:254.3; IV Piggyback:1330] Out: 7893 [Urine:1300; Stool:3]  PHYSICAL EXAMINATION: General appearance:  66 Year old  female, cachectic  NAD Eyes: anicteric sclerae, moist  conjunctivae; PERRL, EOMI bilaterally. Mouth:  membranes and no mucosal ulceration Neck: Trachea midline; neck supple, no JVD Lungs/chest: occasional rhonchi, with normal respiratory effort and no intercostal retractions CV: RRR, no MRGs  Abdomen: Soft, non-tender Skin: Normal temperature, turgor and texture; no rash, ulcers or subcutaneous nodules Psych:lethargic w/d to painful stim.  LABS:  BMET  Recent Labs Lab 07/03/17 1554 07/04/17 0510 07/05/17 0440  NA 141 135 137  K 2.7* 3.0* 3.5  CL 100* 99* 102  CO2 34* 31 30  BUN 7 5* 11  CREATININE 0.80 0.73 0.78  GLUCOSE 149* 141* 123*    Electrolytes  Recent Labs Lab 06/29/17 0420  07/03/17 0909 07/03/17 1554 07/04/17 0510 07/05/17 0440  CALCIUM 8.1*  < >  --  7.9* 7.3* 7.1*  MG 1.7  --  1.0*  --  1.3*  --   < > = values in this interval not displayed.  CBC  Recent Labs Lab 07/03/17 0513 07/04/17 0510 07/05/17 0440  WBC 10.3 10.6* 10.7*  HGB 7.9* 7.9* 8.0*  HCT 25.6* 25.7* 26.3*  PLT 406* 376 353    Coag's No results for input(s): APTT, INR in the last 168 hours.  Sepsis Markers No results for input(s): LATICACIDVEN, PROCALCITON, O2SATVEN in the last 168 hours.  ABG No results for input(s): PHART, PCO2ART, PO2ART in the last 168 hours.  Liver Enzymes  Recent Labs Lab 06/30/17 0439  AST 12*  ALT 8*  ALKPHOS 80  BILITOT 0.3  ALBUMIN 1.4*    Cardiac  Enzymes No results for input(s): TROPONINI, PROBNP in the last 168 hours.  Glucose  Recent Labs Lab 07/04/17 1657 07/04/17 1921 07/04/17 2354 07/05/17 0407 07/05/17 0729 07/05/17 1129  GLUCAP 114* 98 116* 115* 125* 90    Imaging No results found.   STUDIES:  MRI brain 7/29 >  New tiny bilateral subdural hematomas over both posterior cerebral convexities. No mass effect.   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  No evidence of metastatic disease within limitations of motion. EEG 7/26 > generalized cerebral  dysfunction, no epileptiform discharges.  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 Intrabdominal abscess - s/p ex lap with repair of SB perf  DM   ? Hypothyroidism - TSH slightly elevated.    DISCUSSION: 66yo female with Stage IV metastatic adenocarcinoma of the appendix admitted with GI bleeding. Course c/b small SDH, seizure, UTI, worsening encephalopathy.  We were asked to see for airway protection concern after pt found to be more encephalopathic after what was felt to be seizure. Now on anti-epileptic drugs w/ no further seizure activity but persistent encephalopathy.  -We do not think she is a candidate for intubation and that this would be futile care.   Plan Cont supportive care as outlined We feel that intubation or ACLS measures would be futile care.  We will sign off.   Erick Colace ACNP-BC [place] Pager # [phone] OR # [phone] if no answer    07/05/2017 12:03 PM   STAFF NOTE: I, Merrie Roof, MD FACP have personally reviewed patient's available data, including medical history, events of note, physical examination and test results as part of my evaluation. I have discussed with resident/NP and other care providers such as pharmacist, RN and RRT. In addition, I personally evaluated patient and elicited key findings of: moaning, no seizure activity clinically, NO ronchi, good air entry, no stridor any any upper airway sounds, she is  protecting airway currently, intubation would be futile and medically ineffective therapy, continued feeds to goal with strict asp precautions, cEEG on going, I reviewed eeg thus far with slowing and possible left side changes without clear focus, I reviewed repeat CT head with progressive encephalomalacia, her prognosis regardless of cause is poor for functional recovery, suction prn, upright as able, would not offer NIMV as would increase asp risk, continued pall care talks, call if needed, will sign off  Lavon Paganini. Titus Mould, MD, FACP Pgr: [facility] 07/05/2017 12:18 PM

## 2017-07-05 NOTE — Procedures (Signed)
LTM-EEG Report  HISTORY: Continuous video-EEG monitoring performed for 66 year old with sepsis, encephalopathy, possible seizures.  ACQUISITION: International 10-20 system for electrode placement; 18 channels with additional eyes linked to ipsilateral ears and EKG. Additional T1-T2 electrodes were used. Continuous video recording obtained.   EEG NUMBER: MEDICATIONS:  Day 1: LEV   DAY #1: from 1610 07/04/17 to 0730 07/05/17  BACKGROUND: An overall medium voltage, continuous recording with some spontaneous variability and reactivity. The record consisted primarily of medium voltage theta-delta activity bilaterally with mild asymmetry with increase sharply contoured slow activity over the left hemisphere (quasiperiodic at times). This asymmetry resolved over the course of the recording. Reactivity was present with atypical arousals consisting of increased delta activity but no clear evidence of a posterior basic rhythm. No clear sleep architecture was seen.  EPILEPTIFORM/PERIODIC: Some sharply contoured slow discharges over the left initially but without definite epileptiform appearance. These resolved completely over the course of the recording. SEIZURES: none EVENTS: One reported event at 1639 with possible head shaking but no change in EEG background.   EKG: no significant arrhythmia  SUMMARY: This was a moderately abnormal continuous video EEG due to diffuse slow activity, loss of normal background rhythms, and atypical reactivity. This was indicative of a diffuse cerebral disturbance which was possibly maximal over the left. There were no definite epileptiform discharges or seizures.

## 2017-07-05 NOTE — Progress Notes (Signed)
Checked LTM, impeadances. W/in acceptable range.  No skin break down noted. Continue monitoring.

## 2017-07-05 NOTE — Progress Notes (Signed)
PT Cancellation Note  Patient Details Name: Lori [name] MRN: [id number] DOB: [birth year]   Cancelled Treatment:    Reason Eval/Treat Not Completed: Patient's level of consciousness Pt on continuous EEG and still not very responsive. Will sign off now as pt not able to participate this week. Please re-consult when/if appropriate.    Marguarite Arbour A Suhailah Kwan 07/05/2017, 10:18 AM Wray Kearns, PT, DPT [phone]

## 2017-07-05 NOTE — Clinical Social Work Note (Signed)
Clinical Social Work Assessment  Patient Details  Name: Lori [name] MRN: [id number] Date of Birth: 05-02-1951  Date of referral:  07/05/17               Reason for consult:  Facility Placement                Permission sought to share information with:  Family Supports, Chartered certified accountant granted to share information::  No (pt DO)  Name::     Herbalist::  [employer]  Relationship::  son  Contact Information:     Housing/Transportation Living arrangements for the past 2 months:  [place], [place] of Information:  Adult Children Patient Interpreter Needed:  None Criminal Activity/Legal Involvement Pertinent to Current Situation/Hospitalization:  No - Comment as needed Significant Relationships:  Adult Children Lives with:  Adult Children Do you feel safe going back to the place where you live?  No Need for family participation in patient care:  Yes (Comment) (decision making at this time)  Care giving concerns:  Pt acutely ill- not safe to return home in current condition but not appropriate for rehab either.   Social Worker assessment / plan:  CSW met with pt son and confirmed pt was at [facility] inquired if they would want pt to return to [facility] if appropriate when stable for DC.  Employment status:  Retired Nurse, adult PT Recommendations:  [place] / Referral to community resources:  [place]  Patient/Family's Response to care:  Son agreeable to CSW keeping [facility] up to date if patient becomes appropriate for rehab.  Patient/Family's Understanding of and Emotional Response to Diagnosis, Current Treatment, and Prognosis:  Unclear understanding- per palliative notes family has not been willing to focus on comfort despite pt poor prognosis- when CSW speaking with son he seemed very somber and clearly distressed  about pt condition.  Emotional Assessment Appearance:  Appears stated age Attitude/Demeanor/Rapport:  Unable to Assess Affect (typically observed):  Unable to Assess Orientation:   (non-responsive at this time) Alcohol / Substance use:  Not Applicable Psych involvement (Current and /or in the community):  No (Comment)  Discharge Needs  Concerns to be addressed:  Care Coordination Readmission within the last 30 days:  Yes Current discharge risk:  Physical Impairment, Terminally ill Barriers to Discharge:  Continued Medical Work up (family not realistic about medical condition at this time)   Jorge Ny, LCSW 07/05/2017, 10:13 AM

## 2017-07-05 NOTE — Progress Notes (Addendum)
[facility] Progress Note   Subjective: getting continuous EEG, so far only toxic- metabolic is showing up.  UOP 800 cc yesterday, Na 137 stable. Repeat head CT show no subdural as on previous.  Also small lacune in thalamus was a bit larger per my understanding of the report.  No new major findings.    Vitals:   07/05/17 0500 07/05/17 0600 07/05/17 0732 07/05/17 1131  BP: 121/74 125/78 131/73 128/73  Pulse: 77 83 75 81  Resp: 16 20 17 19   Temp:   99.3 F (37.4 C) 99.3 F (37.4 C)  TempSrc:   Axillary Axillary  SpO2: 97% 96% 96% 97%  Weight:      Height:        Inpatient medications: . carbamazepine  150 mg Oral TID  . cloNIDine  0.1 mg Transdermal Weekly  . feeding supplement (PRO-STAT SUGAR FREE 64)  30 mL Per Tube TID  . folic acid  1 mg Intravenous Daily  . insulin aspart  0-15 Units Subcutaneous Q4H  . metoprolol tartrate  5 mg Intravenous Q6H  . sodium hypochlorite   Irrigation BID   . ampicillin-sulbactam (UNASYN) IV Stopped (07/05/17 1116)  . dextrose 5 % with kcl 40 mL/hr at 07/04/17 1039  . feeding supplement (JEVITY 1.2 CAL) 1,000 mL (07/05/17 0434)  . levETIRAcetam Stopped (07/05/17 0233)   alum & mag hydroxide-simeth, hydrALAZINE, ondansetron **OR** ondansetron (ZOFRAN) IV, sodium chloride flush  Exam: Obtunded, no verbal response. Groans from time to time and has upper body/ head movements but not following commadns No jvd Chest rhonchi R chest, L clear' RRR no mrg aBd open wound packed mid abd 7x 12 cm, no purulence or odor, dec'd BS Ext no LE or UE edema R upper chest port is accessed      Impression: 1  AMS - hx of seizures, per neuro event on 7/26 prob seizures per neurology.  Getting continuous EEG now, and getting IV Keppra and now per NG is getting tegretol as well. Repeat head CT showed resolution of small bilat SDH, and showed a new lacunar small CVA in the L thalamic region.  Still not waking up.  Have d/w neuro.  Will ask radiology  to do LP, r/o carcinomatous meningitis.   2  Peritoneal mets/ appendix Ca stage IV - w recent SBO/ perf viscous sp repair. Has open wound w/ looks clean and is being packed. Not candidate for any chemoRx.  3  Polyuria - probable DI.  Will cont DDAVP 1ug per day, serum Na improved from 150 > 137 today. UOP around 1 L per day. 4  Nutrition - now with NG tube and TF's 5  DM cont SSI 6  Seizure d/o - cont IV Keppra, and po tegretol 7  HTN - bp's better w/ Clon patch 0.1 8  Enterococcus UTI - on Unasyn (or augmentin when NG in) for 7 d total 9  DVT proph - SCD's  Plan - as above  Kelly Splinter MD [facility] pgr [phone] 07/05/2017, 7:30 PM

## 2017-07-05 NOTE — Care Management Important Message (Signed)
Important Message  Patient Details  Name: Lori [name] MRN: [id number] Date of Birth: 1951-01-20   Medicare Important Message Given:  Yes    [name] May 07/05/2017, 10:16 AM

## 2017-07-05 NOTE — Progress Notes (Addendum)
Subjective: Patient on continuous EEG, has dobhoff placed and getting Tegretol.   ROS: unable to perform due to mental status  Exam: Vitals:   07/05/17 1131 07/05/17 1546  BP: 128/73 132/74  Pulse: 81 78  Resp: 19 18  Temp: 99.3 F (37.4 C) 99.1 F (37.3 C)   Gen: In bed, awake  Resp: non-labored breathing, no acute distress Abd: soft, nt   Neuro: MS: eyes open spontaneously, tracks with eyes OJ:JKKXFG are equal, face symmetric Motor: withdrawsto painful stimulus Sensation:Grimaces to noxious stimuli  DTRs:3+ over left patellar, ankle reflexes.  Coordination: unable to test   Assessment:   Metabolic and SepticEncephalopathy Seizures Metastatic Cancer stage IV Diabetes insipidus Rectal bleeding and anemia UTI and intrabdominal abscesses Bilateral small SDH Left Thalamic Stroke   This is a 66 year old female with a past medical history significant for metastatic adenocarcinoma of the appendix, CVA with left sided deficits and known seizure disorder was brought in for GIB and subsequently found to have intra-abdominal abscesses. She was coded "stroke"  on 2023/07/28 increased lethargy, garbled speech and a facial droop. Neurology was consulted for evaluation - favored to be a seizure as CT/MRI both without acute abnormality ( MRI did show restricted diffusion in left thalamus thought to be artifactual). EEG on 7/27 did not show epileptiform activity.   Patient was still lethargic, we started continuous EEG yesterday. The EGD did not show electrographic seizures however showed periodic lateralized discharges in the ictal interictal pattern. The load of the patient additional dose of Tegretol and resumed the Tegretol which was not being given due to the absence of a feeding tube. The waveform has improved slightly and the patient appears to be more alert today. She still does not follow any commands and continues to be nonverbal.  Repeat CT head was stable and  demonstrated a small hypodensity in the left thalamus that correlated to the restriction diffusion on MRI. We decided to pursue with a lumbar puncture to rule out carcinomatous meningitis after having a discussion with the hospitalist.  Recommendations Continue IV Keppra and Tegretol for seizure prophylaxis.  Continue to treat infection and metabolic conditions  Prolonged EEG, if seizure free with disconnect tomorrw LP to cytology, protein, glucose, cells. Paraneoplastic panel Teenager treated medically metabolic conditions and minimize pain medications and sedation

## 2017-07-06 DIAGNOSIS — I639 Cerebral infarction, unspecified: Secondary | ICD-10-CM

## 2017-07-06 LAB — GLUCOSE, CAPILLARY
GLUCOSE-CAPILLARY: 128 mg/dL — AB (ref 65–99)
GLUCOSE-CAPILLARY: 119 mg/dL — AB (ref 65–99)
Glucose-Capillary: 103 mg/dL — ABNORMAL HIGH (ref 65–99)
Glucose-Capillary: 109 mg/dL — ABNORMAL HIGH (ref 65–99)
Glucose-Capillary: 120 mg/dL — ABNORMAL HIGH (ref 65–99)
Glucose-Capillary: 123 mg/dL — ABNORMAL HIGH (ref 65–99)
Glucose-Capillary: 128 mg/dL — ABNORMAL HIGH (ref 65–99)

## 2017-07-06 LAB — BASIC METABOLIC PANEL
Anion gap: 3 — ABNORMAL LOW (ref 5–15)
BUN: 11 mg/dL (ref 6–20)
CALCIUM: 7.2 mg/dL — AB (ref 8.9–10.3)
CO2: 30 mmol/L (ref 22–32)
CREATININE: 0.7 mg/dL (ref 0.44–1.00)
Chloride: 107 mmol/L (ref 101–111)
GFR calc Af Amer: >60 mL/min (ref >60)
GLUCOSE: 116 mg/dL — AB (ref 65–99)
Potassium: 3.1 mmol/L — ABNORMAL LOW (ref 3.5–5.1)
SODIUM: 140 mmol/L (ref 135–145)

## 2017-07-06 LAB — CBC
HEMATOCRIT: 24.8 % — AB (ref 36.0–46.0)
Hemoglobin: 7.6 g/dL — ABNORMAL LOW (ref 12.0–15.0)
MCH: 27.4 pg (ref 26.0–34.0)
MCHC: 30.6 g/dL (ref 30.0–36.0)
MCV: 89.5 fL (ref 78.0–100.0)
Platelets: 311 10*3/uL (ref 150–400)
RBC: 2.77 MIL/uL — ABNORMAL LOW (ref 3.87–5.11)
RDW: 17.4 % — AB (ref 11.5–15.5)
WBC: 10.9 10*3/uL — AB (ref 4.0–10.5)

## 2017-07-06 MED ORDER — POTASSIUM CHLORIDE 20 MEQ/15ML (10%) PO SOLN
40.0000 meq | Freq: Every day | ORAL | Status: DC
  Administered 2017-07-06 – 2017-07-18 (×11): 40 meq via ORAL
  Filled 2017-07-06 (×14): qty 30

## 2017-07-06 NOTE — Progress Notes (Signed)
PROGRESS NOTE    Lori [name]  SHF:[id number] DOB: [birth year] DOA: 06/23/2017 PCP: [name], MD   Chief Complaint  Patient presents with  . 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 She was at a skilled nursing facility. She was recently hospitalized for small bowel obstruction and underwent diagnostic laparoscopy in June. She underwent repair of small bowel perforation. She had a prolonged hospital stay. Subsequently was discharged to skilled nursing facility. Presented back with rectal bleeding. Seen by oncology. Not thought to be a candidate for any treatment. Then she had what appears to be a seizure resulting in encephalopathy. Neurology was consulted. Palliative medicine continues to follow. Family still desires aggressive care. Patient was transferred to stepdown unit. Critical care medicine was consulted. Patient noted to have profuse urine output. 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 Assessment & Plan   Acute metabolic encephalopathy/Left Thalamic Stroke/ Bilateral small SDH -Patient was noted to have garbled speech on the afternoon of 06/28/2017 -Initial concern for stroke -Neurology consultation appreciated -CT head on 8/1: Evolving encephalomalacia at suspected left thalamic lacunar infarct. Previously identified tiny bilateral subdural hematoma -MRI brain on 7/29: New tiny bilateral subdural hematomas, trace subarachnoid hemorrhage, punctate focus of diffusion abnormality in the dorsal thalamus -It was thought the patient was having seizure activity and she was loaded with Keppra -Patient has had continuous EEG monitoring: Progressively improving diffuse encephalopathy  -Considering lumbar puncture, will need  cytology, protein, glucose, cells, paraneoplastic panel -Continue IV Keppra and Tegretol  History of seizure disorder -Patient was on carbamazepine at home however this is not initiated at time of admission -Treatment plan as above  Diabetes insipidus/polyuria -Patient had excessive urine output thought to be due to central diabetes insipidus due to abnormalities noted on MRI of the brain -Sodium level was also elevated with high serum osmolality 100 -She was given DDAVP -Urine output decreased 350cc over past 24 hours -Currently sodim 140  Primary appendiceal carcinoma with metastatic disease, stage IV -Oncology consultation appreciated, patient is not a candidate for chemotherapy and is thought to have an incurable condition. Poor prognosis -Palliative care was consulted. -Patient had recent SPO with per viscus status post repair -Gen. surgery consulted and following  Rectal bleeding of unknown cause -Appears to be stable, patient is not a candidate for endoscopic evaluation due to recent surgery. This was discussed by previous hospitalist with general surgery.  Anemia secondary to acute blood loss -Hemoglobin currently 7.6, appears stable -transfuse if hemoglobin <7 -Continue to monitor CBC  Abdominal fluid collection on CT scan -Previous hospitalist discussed this with interventional radiologist -Patient was on Zosyn however this was discontinued as it could lower seizure threshold. Transition to cefepime and Flagyl, however flagyl was also discontinued -Currently on cefepime only  Diabetes mellitus, type II -Continue insulin sliding scale CBG monitoring  Essential hypertension -Continue clonidine, hydralazine as needed  Enterococcus UTI -As above, patient was on Zosyn however transitioned to Unasyn  Nutrition -Patient continues to be encephalopathic and had poor oral intake -Currently has Dobbhoff tube with tube feeds  Hypokalemia/hypomagnesemia -Will replace and  continue to monitor  Goals of care -Palliative care was consulted and appreciated however family wishes to continue aggressive care  DVT Prophylaxis  SCDs  Code Status: Full  Family Communication: Family at bedside  Disposition Plan:  Admitted. Pending improvement. Dispo TBD  Consultants PCCM General surgery Neurology  Oncology Palliative care  Procedures  Continuous EEG  Antibiotics   Anti-infectives    Start     Dose/Rate Route Frequency Ordered Stop   07/04/17 1100  Ampicillin-Sulbactam (UNASYN) 3 g in sodium chloride 0.9 % 100 mL IVPB     3 g 200 mL/hr over 30 Minutes Intravenous Every 6 hours 07/04/17 0823 07/10/17 1059   07/03/17 1600  Ampicillin-Sulbactam (UNASYN) 3 g in sodium chloride 0.9 % 100 mL IVPB  Status:  Discontinued     3 g 200 mL/hr over 30 Minutes Intravenous Every 6 hours 07/03/17 1047 07/04/17 0823   06/29/17 1400  ceFEPIme (MAXIPIME) 2 g in dextrose 5 % 50 mL IVPB  Status:  Discontinued     2 g 100 mL/hr over 30 Minutes Intravenous Every 8 hours 06/29/17 0853 06/29/17 0857   06/29/17 1000  ceFEPIme (MAXIPIME) 2 g in dextrose 5 % 50 mL IVPB  Status:  Discontinued     2 g 100 mL/hr over 30 Minutes Intravenous Every 8 hours 06/29/17 0857 07/03/17 1046   06/29/17 0930  metroNIDAZOLE (FLAGYL) IVPB 500 mg  Status:  Discontinued     500 mg 100 mL/hr over 60 Minutes Intravenous Every 8 hours 06/29/17 0838 06/30/17 0956   06/24/17 0600  piperacillin-tazobactam (ZOSYN) IVPB 3.375 g  Status:  Discontinued     3.375 g 12.5 mL/hr over 240 Minutes Intravenous Every 8 hours 06/23/17 2254 06/29/17 0838   06/23/17 2300  piperacillin-tazobactam (ZOSYN) IVPB 3.375 g     3.375 g 100 mL/hr over 30 Minutes Intravenous  Once 06/23/17 2254 06/24/17 0056      Subjective:   Lori [name] seen and examined today.   Was having continuous EEG. Said "good morning" to me as I was examining her. Speech still garbled.   Objective:   Vitals:   07/06/17 0900 07/06/17  1100 07/06/17 1150 07/06/17 1200  BP: 133/78 114/72 134/81 140/80  Pulse: 86 89 93 95  Resp: 18 18 17 20   Temp:   99.3 F (37.4 C)   TempSrc:   Axillary   SpO2: 100% 100% 100% 100%  Weight:      Height:        Intake/Output Summary (Last 24 hours) at 07/06/17 1408 Last data filed at 07/06/17 1320  Gross per 24 hour  Intake             2789 ml  Output             1200 ml  Net             1589 ml   Filed Weights   07/02/17 0325 07/05/17 0442 07/06/17 0445  Weight: 81.2 kg (179 lb 0.2 oz) 90.2 kg (198 lb 13.7 oz) 90.4 kg (199 lb 4.7 oz)    Exam  General: Well developed, ill-appearing, NAD  HEENT: NCAT,mucous membranes moist.   Cardiovascular: S1 S2 auscultated, no rubs, murmurs or gallops. Regular rate and rhythm.  Respiratory: Diminished breath sounds, rhonchi on Right.   Abdomen: Soft, nondistended, +bowel sounds, open wounds with packing  Extremities: warm dry without cyanosis clubbing or edema  Neuro: Awake, stated "good morning". Speech still garbled. Tracks me through the room.   Data Reviewed: I have personally reviewed following labs and imaging studies  CBC:  Recent Labs Lab 07/02/17 0320 07/03/17 0513 07/04/17 0510 07/05/17 0440 07/06/17 0500  WBC 10.4 10.3 10.6* 10.7* 10.9*  HGB 7.2*  7.9* 7.9* 8.0* 7.6*  HCT 24.3* 25.6* 25.7* 26.3* 24.8*  MCV 90.0 88.3 87.4 88.9 89.5  PLT 398 406* 376 353 762   Basic Metabolic Panel:  Recent Labs Lab 07/03/17 0513 07/03/17 0909 07/03/17 1554 07/04/17 0510 07/05/17 0440 07/06/17 0500  NA 144  --  141 135 137 140  K 2.4*  --  2.7* 3.0* 3.5 3.1*  CL 102  --  100* 99* 102 107  CO2 35*  --  34* 31 30 30   GLUCOSE 114*  --  149* 141* 123* 116*  BUN 7  --  7 5* 11 11  CREATININE 0.90  --  0.80 0.73 0.78 0.70  CALCIUM 8.1*  --  7.9* 7.3* 7.1* 7.2*  MG  --  1.0*  --  1.3*  --   --    GFR: Estimated Creatinine Clearance: 81.4 mL/min (by C-G formula based on SCr of 0.7 mg/dL). Liver Function Tests:  Recent  Labs Lab 06/30/17 0439  AST 12*  ALT 8*  ALKPHOS 80  BILITOT 0.3  PROT 5.8*  ALBUMIN 1.4*   No results for input(s): LIPASE, AMYLASE in the last 168 hours.  Recent Labs Lab 07/02/17 1627  AMMONIA 30   Coagulation Profile:  Recent Labs Lab 07/05/17 1330  INR 1.62   Cardiac Enzymes: No results for input(s): CKTOTAL, CKMB, CKMBINDEX, TROPONINI in the last 168 hours. BNP (last 3 results) No results for input(s): PROBNP in the last 8760 hours. HbA1C: No results for input(s): HGBA1C in the last 72 hours. CBG:  Recent Labs Lab 07/05/17 1926 07/06/17 0018 07/06/17 0431 07/06/17 0825 07/06/17 1148  GLUCAP 115* 109* 103* 128* 120*   Lipid Profile: No results for input(s): CHOL, HDL, LDLCALC, TRIG, CHOLHDL, LDLDIRECT in the last 72 hours. Thyroid Function Tests: No results for input(s): TSH, T4TOTAL, FREET4, T3FREE, THYROIDAB in the last 72 hours. Anemia Panel: No results for input(s): VITAMINB12, FOLATE, FERRITIN, TIBC, IRON, RETICCTPCT in the last 72 hours. Urine analysis:    Component Value Date/Time   COLORURINE STRAW (A) 07/02/2017 1704   APPEARANCEUR CLEAR 07/02/2017 1704   LABSPEC 1.005 07/02/2017 1704   LABSPEC 1.010 08/17/2015 1542   PHURINE 8.0 07/02/2017 1704   GLUCOSEU NEGATIVE 07/02/2017 1704   GLUCOSEU 100 08/17/2015 1542   HGBUR SMALL (A) 07/02/2017 1704   BILIRUBINUR NEGATIVE 07/02/2017 1704   BILIRUBINUR Negative 08/17/2015 1542   KETONESUR NEGATIVE 07/02/2017 1704   PROTEINUR NEGATIVE 07/02/2017 1704   UROBILINOGEN 1.0 08/26/2015 0725   UROBILINOGEN 0.2 08/17/2015 1542   NITRITE NEGATIVE 07/02/2017 1704   LEUKOCYTESUR NEGATIVE 07/02/2017 1704   LEUKOCYTESUR Trace 08/17/2015 1542   Sepsis Labs: @LABRCNTIP (procalcitonin:4,lacticidven:4)  )No results found for this or any previous visit (from the past 240 hour(s)).    Radiology Studies: No results found.   Scheduled Meds: . carbamazepine  150 mg Oral TID  . cloNIDine  0.1 mg  Transdermal Weekly  . feeding supplement (PRO-STAT SUGAR FREE 64)  30 mL Per Tube TID  . folic acid  1 mg Intravenous Daily  . insulin aspart  0-15 Units Subcutaneous Q4H  . metoprolol tartrate  5 mg Intravenous Q6H  . potassium chloride  40 mEq Oral Daily  . sodium hypochlorite   Irrigation BID   Continuous Infusions: . ampicillin-sulbactam (UNASYN) IV Stopped (07/06/17 1153)  . dextrose 5 % with kcl 40 mL/hr at 07/04/17 1039  . feeding supplement (JEVITY 1.2 CAL) 1,000 mL (07/06/17 0444)  . levETIRAcetam Stopped (07/06/17 1320)  LOS: 13 days   Time Spent in minutes   30 minutes  Earnest Thalman D.O. on 07/06/2017 at 2:08 PM  Between 7am to 7pm - Pager - [phone]  After 7pm go to [email or web address] - password [email or web address]  And look for the night coverage person covering for me after hours  [facility] Office  [phone]

## 2017-07-06 NOTE — Plan of Care (Signed)
Problem: Education: Goal: Knowledge of [facility] Education information/materials will improve Outcome: Progressing Patient unable to participate in education, multiple family members at bedside that are able to participate in education.   Problem: Health Behavior/Discharge Planning: Goal: Ability to manage health-related needs will improve Outcome: Not Progressing Family managing some health needs, patient is unable.

## 2017-07-06 NOTE — Progress Notes (Signed)
vLTM EEG complete no skin breakdown 

## 2017-07-06 NOTE — Progress Notes (Signed)
Patient ID: Lori [name], female   DOB: 04-08-1951, 66 y.o.   MRN: [id number]  [facility] Surgery Progress Note     Subjective: CC- abdominal wound Patient is awake and alert this afternoon, communicating appropriately. She denies any abdominal pain, nausea, vomiting. Tolerating tube feeds and having bowel movements. EEG showed progressively improving diffuse encephalopathy but not specific as to etiology.   Objective: Vital signs in last 24 hours: Temp:  [98.1 F (36.7 C)-99.4 F (37.4 C)] 99.3 F (37.4 C) (08/03 1150) Pulse Rate:  [78-95] 95 (08/03 1200) Resp:  [11-22] 20 (08/03 1200) BP: (114-150)/(72-81) 140/80 (08/03 1200) SpO2:  [94 %-100 %] 100 % (08/03 1200) Weight:  [199 lb 4.7 oz (90.4 kg)] 199 lb 4.7 oz (90.4 kg) (08/03 0445) Last BM Date: 07/06/17  Intake/Output from previous day: 08/02 0701 - 08/03 0700 In: 2374 [I.V.:960; NG/GT:794; IV Piggyback:620] Out: 850 [Urine:850] Intake/Output this shift: Total I/O In: 675 [I.V.:240; NG/GT:225; IV Piggyback:210] Out: 350 [Urine:350]  PE: Gen:  Alert, NAD, talking appropriately HEENT: pupils equal and round Card:  RRR, no M/G/R heard Pulm:  CTAB, no W/R/R, effort normal Skin: no rashes noted, warm and dry Abd: Soft, NT/ND, +BS, midline incision with significant slough (slight improvement from 2 days ago), no purulent drainage or surrounding erythema      Lab Results:   Recent Labs  07/05/17 0440 07/06/17 0500  WBC 10.7* 10.9*  HGB 8.0* 7.6*  HCT 26.3* 24.8*  PLT 353 311   BMET  Recent Labs  07/05/17 0440 07/06/17 0500  NA 137 140  K 3.5 3.1*  CL 102 107  CO2 30 30  GLUCOSE 123* 116*  BUN 11 11  CREATININE 0.78 0.70  CALCIUM 7.1* 7.2*   PT/INR  Recent Labs  07/05/17 1330  LABPROT 19.4*  INR 1.62   CMP     Component Value Date/Time   NA 140 07/06/2017 0500   K 3.1 (L) 07/06/2017 0500   CL 107 07/06/2017 0500   CO2 30 07/06/2017 0500   GLUCOSE 116 (H) 07/06/2017 0500   BUN 11 07/06/2017 0500   CREATININE 0.70 07/06/2017 0500   CALCIUM 7.2 (L) 07/06/2017 0500   PROT 5.8 (L) 06/30/2017 0439   ALBUMIN 1.4 (L) 06/30/2017 0439   AST 12 (L) 06/30/2017 0439   ALT 8 (L) 06/30/2017 0439   ALKPHOS 80 06/30/2017 0439   BILITOT 0.3 06/30/2017 0439   GFRNONAA >60 07/06/2017 0500   GFRAA >60 07/06/2017 0500   Lipase     Component Value Date/Time   LIPASE 31 05/14/2017 2106       Studies/Results: No results found.  Anti-infectives: Anti-infectives    Start     Dose/Rate Route Frequency Ordered Stop   07/04/17 1100  Ampicillin-Sulbactam (UNASYN) 3 g in sodium chloride 0.9 % 100 mL IVPB     3 g 200 mL/hr over 30 Minutes Intravenous Every 6 hours 07/04/17 0823 07/10/17 1059   07/03/17 1600  Ampicillin-Sulbactam (UNASYN) 3 g in sodium chloride 0.9 % 100 mL IVPB  Status:  Discontinued     3 g 200 mL/hr over 30 Minutes Intravenous Every 6 hours 07/03/17 1047 07/04/17 0823   06/29/17 1400  ceFEPIme (MAXIPIME) 2 g in dextrose 5 % 50 mL IVPB  Status:  Discontinued     2 g 100 mL/hr over 30 Minutes Intravenous Every 8 hours 06/29/17 0853 06/29/17 0857   06/29/17 1000  ceFEPIme (MAXIPIME) 2 g in dextrose 5 % 50 mL IVPB  Status:  Discontinued     2 g 100 mL/hr over 30 Minutes Intravenous Every 8 hours 06/29/17 0857 07/03/17 1046   06/29/17 0930  metroNIDAZOLE (FLAGYL) IVPB 500 mg  Status:  Discontinued     500 mg 100 mL/hr over 60 Minutes Intravenous Every 8 hours 06/29/17 0838 06/30/17 0956   06/24/17 0600  piperacillin-tazobactam (ZOSYN) IVPB 3.375 g  Status:  Discontinued     3.375 g 12.5 mL/hr over 240 Minutes Intravenous Every 8 hours 06/23/17 2254 06/29/17 0838   06/23/17 2300  piperacillin-tazobactam (ZOSYN) IVPB 3.375 g     3.375 g 100 mL/hr over 30 Minutes Intravenous  Once 06/23/17 2254 06/24/17 0056       Assessment/Plan Diabetes insipidus Seizure disorder Lower GI bleed - resolved Anemia - Hg stable DM-II HTN Enterococcus UTI - on  unasyn Acute metabolic encephalopathy - improving  Primary appendiceal carcinoma with metastatic disease, stage IV  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 [name]: 1. 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  2. Exploratory laparotomy, drainage of abdominal abscess, repair of small bowel perforation, repair of small bowel enterotomy, enterocolostomy (mid ileum to mid transverse colon). - having BM's, tolerating TF - BID wet to dry dressing changes with Dakins to midline abdominal wound  FEN- IVF, NPO, TF VTE- SCDs, No anticoagulants due to anemia ID- Zosyn 7/21>>7/27, Flagyl 7/27>>7/28, Cefipime 7/27>>7/27, Unasyn 7/31>>   Plan: Continue BID wet to dry dressing with Dakins solution to the abdominal wound.   LOS: 13 days    Wellington Hampshire , [facility] Surgery 07/06/2017, 1:44 PM Pager: [phone] Consults: [phone] Mon-Fri 7:00 am-4:30 pm Sat-Sun 7:00 am-11:30 am

## 2017-07-06 NOTE — Procedures (Signed)
  Video EEG Monitoring Report    Dates of monitoring:   07/05/17 @07 :30 to 07/06/17 @07 :30  Recording day:    2 (started on 8/1) EEG Number:    [id number] Requesting provider:   Aroor, Lanice Schwab, MD Interpreting physician:  Becky Sax, MD  CPT:  [phone] ICD-10:   R56.9, R41.82   Present History: 66 year old female with a past medical history significant for metastatic adenocarcinoma of the appendix, CVA with left sided deficits and known seizure disorder who had an acute event of lethargy and facial droop on 7/26 and remained poorly responsive. Continuous VEEG requested to evaluate for seizures.   Previous reports: 8/1-8/2 This was a moderately abnormal continuous video EEG due to diffuse slow activity, loss of normal background rhythms, and atypical reactivity. This was indicative of a diffuse cerebral disturbance which was possibly maximal over the left. There were no definite epileptiform discharges or seizures.    EEG Classification  Abnormal, Awake and sleep  PDR  8-9 Hz, poorly sustained  HR  80 bpm and regular irregularly irregular    Background abnormalities:   1. Continuous slow --> Intermittent slow, generalized    Periodic, rhythmic or epileptiform abnormalities:   none   Ictal phenomena:  none    EEG DETAILS:  TYPE OF RECORDING: At least 18-channel digital EEG with using a standard international 10-20 placement with additional EEG electrodes, and 1 additional channel dedicated to the EKG, at a sampling rate of at least 256 Hz. Video was recorded throughout the study. The recording was interpreted using digital review software allowing for montage reformatting, gain and filter changes. Each page was reviewed manually. Automatic spike and seizure detection software and quantitative analysis tools were used as needed.   Description of EEG features: The recording opens with a continuous, moderately reactive theta dominant background with intermittent slower delta  overlay.  Initially there is a poor anteroposterior gradient and therefore no PDR, and an average frequency of 7-8 Hz.  Relative arousals are characterized by recurrence of higher amplitude sinusoid 1-2 Hz rhythmic delta activity which is symmetric and not evolving.  The background continues to improve throughout the day, and unequivocal wakefulness background is seen starting at around 2 PM.  This alternates with clinical sleep, characterized by diffuse delta>theta slowing, but no normal sleep patterns are seen.   Initially, there is persistent diffuse polymorphic delta activity during the awake state, which slowly is more attenuated, and eventually occurs only intermittently accounting for about 30% of the awake record in the morning of 8/3.  Push Button Events: none  Interpretation: This continuous EEG is indicative of a progressively improving diffuse encephalopathy, but not specific as to etiology.  Towards the end of the recording the degree of encephalopathy is mild.  No epileptiform discharges, seizures or clearly lateralizing signs are seen.

## 2017-07-06 NOTE — Progress Notes (Addendum)
Subjective: Patient told me her name, followed simple commands.    ROS: unable to perform due to mental status  Exam:     Vitals:   07/05/17 1131 07/05/17 1546  BP: 128/73 132/74  Pulse: 81 78  Resp: 19 18  Temp: 99.3 F (37.4 C) 99.1 F (37.3 C)   Gen: In bed, awake  Resp: non-labored breathing, no acute distress Abd: soft, nt   Neuro: MS: eyes open spontaneously, answers her name, follows simple commands such sticking out tongue, closing eyes XY:IAXKPV are equal, eyes dysconjugate, tracks in all directions to voice face symmetric Motor: withdrawsto painful stimulus Sensation:Grimaces to noxious stimuli  DTRs:3+ over left patellar, ankle reflexes.  Coordination: unable to test   Assessment:   Metabolic and SepticEncephalopathy Seizures Metastatic Cancer stage IV Diabetes insipidus Rectal bleeding and anemia UTI and intrabdominal abscesses ?Bilateral small SDH Left Thalamic Stroke   This is a 66 year old female with a past medical history significant for metastatic adenocarcinoma of the appendix, CVA with left sided deficits and known seizure disorder was brought in for GIB and subsequently found to have intra-abdominal abscesses. She was coded "stroke" on 07-13-23 increased lethargy, garbled speech and a facial droop.Neurology was consulted for evaluation - favored to be a seizure as CT/MRI both without acute abnormality ( MRI did show restricted diffusion in left thalamus thought to be artifactual). EEG on 7/27 did not show epileptiform activity.   Patient was still lethargic, we placed VEEG on 07/04/17. The EEG did not show electrographic seizures however showed periodic lateralized discharges in the ictal interictal pattern. The load of the patient additional dose of Tegretol and resumed the Tegretol which was not being given due to the absence of a feeding tube. The EEG has shown improving encephalopathy and patient is  more alert today. She still  does not follow any commands and continues to be nonverbal.  Repeat CT head was stable and demonstrated a small hypodensity in the left thalamus - possibly overread by radiology. We decided to pursue with a lumbar puncture to rule out carcinomatous meningitis after having a discussion with the hospitalist.  Recommendations Continue IV Keppra and Tegretol for seizure prophylaxis.  Continue to treat infection and metabolic conditions  Will DC VEEG LP to cytology, protein, glucose, cells. Paraneoplastic panel, however as patient is improving we can watch before doing procedure Continue to treat medically metabolic conditions and minimize pain medications and sedation

## 2017-07-07 LAB — CBC
HEMATOCRIT: 24.6 % — AB (ref 36.0–46.0)
HEMOGLOBIN: 7.4 g/dL — AB (ref 12.0–15.0)
MCH: 27.6 pg (ref 26.0–34.0)
MCHC: 30.1 g/dL (ref 30.0–36.0)
MCV: 91.8 fL (ref 78.0–100.0)
Platelets: 258 10*3/uL (ref 150–400)
RBC: 2.68 MIL/uL — ABNORMAL LOW (ref 3.87–5.11)
RDW: 17.9 % — ABNORMAL HIGH (ref 11.5–15.5)
WBC: 10 10*3/uL (ref 4.0–10.5)

## 2017-07-07 LAB — BASIC METABOLIC PANEL
Anion gap: 5 (ref 5–15)
BUN: 12 mg/dL (ref 6–20)
CHLORIDE: 109 mmol/L (ref 101–111)
CO2: 27 mmol/L (ref 22–32)
CREATININE: 0.58 mg/dL (ref 0.44–1.00)
Calcium: 7.4 mg/dL — ABNORMAL LOW (ref 8.9–10.3)
GFR calc Af Amer: >60 mL/min (ref >60)
GFR calc non Af Amer: >60 mL/min (ref >60)
GLUCOSE: 107 mg/dL — AB (ref 65–99)
Potassium: 3.3 mmol/L — ABNORMAL LOW (ref 3.5–5.1)
Sodium: 141 mmol/L (ref 135–145)

## 2017-07-07 LAB — GLUCOSE, CAPILLARY
GLUCOSE-CAPILLARY: 120 mg/dL — AB (ref 65–99)
GLUCOSE-CAPILLARY: 117 mg/dL — AB (ref 65–99)
Glucose-Capillary: 109 mg/dL — ABNORMAL HIGH (ref 65–99)
Glucose-Capillary: 122 mg/dL — ABNORMAL HIGH (ref 65–99)
Glucose-Capillary: 116 mg/dL — ABNORMAL HIGH (ref 65–99)
Glucose-Capillary: 128 mg/dL — ABNORMAL HIGH (ref 65–99)

## 2017-07-07 LAB — MAGNESIUM: Magnesium: 1.4 mg/dL — ABNORMAL LOW (ref 1.7–2.4)

## 2017-07-07 NOTE — Evaluation (Signed)
Clinical/Bedside Swallow Evaluation Patient Details  Name: Lori [name] MRN: [id number] Date of Birth: 12/13/1950  Today's Date: 07/07/2017 Time: SLP Start Time (ACUTE ONLY): 1202 SLP Stop Time (ACUTE ONLY): 1215 SLP Time Calculation (min) (ACUTE ONLY): 13 min  Past Medical History:  Past Medical History:  Diagnosis Date  . Anemia 2017   3u blood 2017  . Arthritis    KNEES  . Carcinoma of appendix ([place]) 04/11/2016   Patient with a history of cervical cancer status post chemoradiation with a persistent finding of a dilated appendix with hypermetabolism. This is raised a concern of malignancy. We'll plan to move forward with laparoscopic possible open appendectomy to rule out malignancy.   . Cervical cancer, FIGO stage IIB ([place]) 05/24/2015  . 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 Depression   . Endometrial carcinoma [facility])    goes to [facility] in [place] for chemotherapy  . GERD (gastroesophageal reflux disease)   . Heart murmur   . History of blood transfusion   . 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 History of radiation therapy 07/28/15-08/26/15   cervical region 27.5 gray  . History of seizures    post-op craniotomy for aneurysm 1997-- controlled w/ medication since then no seizures  . Hyperlipidemia   . Hypertension   . PMB (postmenopausal bleeding)   . Pulmonary nodule 03/26/2017  . Seizures ([place])    hx of years ago   . Type 2 diabetes, diet controlled ([place])    diet controlled    Past Surgical History:  Past Surgical History:  Procedure Laterality Date  . COLONOSCOPY    . CRANIOTOMY POSTERIOR FOSSA  1997   Repair aneurysm  x2  left side  . LAPAROSCOPIC APPENDECTOMY N/A 04/19/2016   Procedure: APPENDECTOMY LAPAROSCOPIC;  Surgeon: Hall Busing,  MD;  Location: [facility];  Service: General;  Laterality: N/A;  . LAPAROSCOPY N/A 05/30/2017   Procedure: LAPAROSCOPY DIAGNOSTIC WITH PERITONEAL BIOPSY;  Surgeon: Jackolyn Confer, MD;  Location: [facility];  Service: General;  Laterality: N/A;  . LAPAROTOMY  05/30/2017   Procedure: EXPLORATORY LAPAROTOMY REPAIR WITH DRAINAGE OF INTRA ABDOMINAL ABSCESS OF SMALL BOWEL PERFORATION WITH ENTEROCOLOSTOMY;  Surgeon: Jackolyn Confer, MD;  Location: [facility];  Service: General;;  . PORT-A-CATH PLACEMENT  06/  2016  . TANDEM RING INSERTION N/A 07/28/2015   Procedure: TANDEM RING PLACEMENT;  Surgeon: Gery Pray, MD;  Location: [facility];  Service: Urology;  Laterality: N/A;  . TANDEM RING INSERTION N/A 08/05/2015   Procedure: TANDEM RING PLACEMENT ;  Surgeon: Gery Pray, MD;  Location: [facility];  Service: Urology;  Laterality: N/A;  . TANDEM RING INSERTION N/A 08/11/2015   Procedure: TANDEM RING PLACEMENT ;  Surgeon: Gery Pray, MD;  Location: [facility];  Service: Urology;  Laterality: N/A;  . TANDEM RING INSERTION N/A 08/17/2015   Procedure: TANDEM RING PLACEMENT ;  Surgeon: Gery Pray, MD;  Location: [facility];  Service: Urology;  Laterality: N/A;  . 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 She was recently hospitalized for small bowel obstruction and underwent diagnostic laparoscopy in June. She underwent repair of small bowel perforation. She had a prolonged hospital stay. Subsequently was discharged to skilled nursing facility. Presented  back with rectal bleeding. Seen by oncology. Not thought to be a candidate for any treatment. Then she had what appeared to be a seizure resulting in encephalopathy. Neurology was consulted; pt with acute left thalamic CVA, bilateral small SDH. Initially evaluated by SLP 06/29/17 with recommendations for dys 2, thin liquids, however pt made NPO with cortrak placed per MD with change in MS, SLP s/o on 7/31 as pt remained unresponsive. Per MD notes, pt alert and responsive today, bedside swallow eval ordered.   Assessment / Plan / Recommendation Clinical Impression  Patient presents with moderate-severe risk for aspiration given current mentation, CVA, prolonged NPO status with generalized oral weakness and reduced postural control. Concern for silent aspiration given her dysarthria, oral weakness and what appears to be delayed swallow initiation, multiple swallows with ice chips, thin liquids. Recommend instrumental exam prior to diet initiation given her risk factors for aspiration and clinical presentation today. SLP will f/u at bedside next date, proceed with MBS if pt's LOA sufficient.  SLP Visit Diagnosis: Dysphagia, unspecified (R13.10)    Aspiration Risk  Moderate aspiration risk;Severe aspiration risk    Diet Recommendation NPO;Alternative means - temporary   Medication Administration: Via alternative means    Other  Recommendations Oral Care Recommendations: Oral care QID   Follow up Recommendations        Frequency and Duration            Prognosis Prognosis for Safe Diet Advancement: Fair Barriers to Reach Goals: Cognitive deficits      Swallow Study   General Date of Onset: 06/23/17 HPI: 66 year old female with a past medical history of diabetes, seizure disorder, hypertension, history of brain aneurysm, history of cervical cancer, 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 She was recently  hospitalized for small bowel obstruction and underwent diagnostic laparoscopy in June. She underwent repair of small bowel perforation. She had a prolonged hospital stay. Subsequently was discharged to skilled nursing facility. Presented back with rectal bleeding. Seen by oncology. Not thought to be a candidate for any treatment. Then she had what appeared to be a seizure resulting in encephalopathy. Neurology was consulted; pt with acute left thalamic CVA, bilateral small SDH. Initially evaluated by SLP 06/29/17 with recommendations for dys 2, thin liquids, however pt made NPO with cortrak placed per MD with change in MS, SLP s/o on 7/31 as pt remained unresponsive. Per MD notes, pt alert and responsive today, bedside swallow eval ordered. Type of Study: Bedside Swallow Evaluation Previous Swallow Assessment: see HPI Diet Prior to this Study: NPO;NG Tube Temperature Spikes Noted: No Respiratory Status: Room air History of Recent Intubation: No Behavior/Cognition: Alert;Requires cueing;Distractible Oral Cavity Assessment: Excessive secretions Oral Care Completed by SLP: Recent completion by staff Oral Cavity - Dentition: Missing dentition;Poor condition Vision: Functional for self-feeding Self-Feeding Abilities: Needs assist Patient Positioning: Upright in bed Baseline Vocal Quality: Wet Volitional Cough: Cognitively unable to elicit Volitional Swallow: Unable to elicit    Oral/Motor/Sensory Function Overall Oral Motor/Sensory Function: Generalized oral weakness (pt cognitively unable to follow all commands) Facial Symmetry: Abnormal symmetry right;Other (Comment) Facial Strength: Reduced right;Reduced left  Ice Chips Ice chips: Impaired Presentation: Spoon Oral Phase Impairments: Reduced lingual movement/coordination;Poor awareness of bolus;Reduced labial seal Oral Phase Functional Implications: Prolonged oral transit Pharyngeal Phase Impairments: Suspected delayed Swallow;Multiple swallows    Thin Liquid Thin Liquid: Impaired Presentation: Straw Oral Phase Impairments: Reduced lingual movement/coordination;Reduced labial seal Oral Phase Functional Implications: Prolonged oral transit Pharyngeal  Phase Impairments: Suspected delayed Swallow;Wet Vocal Quality;Multiple swallows    Nectar Thick Nectar Thick Liquid: Not tested   Honey Thick Honey Thick Liquid: Not tested   Puree Puree: Not tested   Solid   GO   Solid: Not tested        Aliene Altes 07/07/2017,1:25 PM  Deneise Lever, [place], [place] Pathologist [phone]

## 2017-07-07 NOTE — Progress Notes (Signed)
[facility]                                                                              Patient Demographics  Lori [name], is a 66 y.o. female, DOB - 1951-05-22, RJJ:[id number]  Admit date - 06/23/2017   Admitting Physician Sid Falcon, MD  Outpatient Primary MD for the patient is [name], MD  Outpatient specialists:   LOS - 14  days   Medical records reviewed and are as summarized below:    Chief Complaint  Patient presents with  . 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 She was at a skilled nursing facility. She was recently hospitalized for small bowel obstruction and underwent diagnostic laparoscopy in June. She underwent repair of small bowel perforation. She had a prolonged hospital stay. Subsequently was discharged to skilled nursing facility. Presented back with rectal bleeding. Seen by oncology. Not thought to be a candidate for any treatment. Then she had what appears to be a seizure resulting in encephalopathy. Neurology was consulted. Palliative medicine continues to follow. Family still desires aggressive care. Patient was transferred to stepdown unit. Critical care medicine was consulted. Patient noted to have profuse urine output. 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   Assessment & Plan    Acute metabolic encephalopathy, acute CVA left thalamic stroke, bilateral small SDH - Garbled speech, lethargy and facial drooping noted on 7/26, neurology consult was obtained as code stroke. CT head showed evolving encephalomalacia, suspected left thalamic lacunar infarct. Previously identified tiny bilateral subdural hematoma. - MRI of the brain 7/29 showed no new tiny bilateral subdural  hematomas, trace subarachnoid hemorrhage, punctate focus of diffusion abnormality in the dorsal thalamus. - Patient was placed on EEG on 8/1, did not show electrographic seizures however showed periodic lateralized discharges in the interictal pattern. Patient was given Keppra loading dose. - Continue IV Keppra and Tegretol - Neurology following closely, planning spinal tap today for cytology, cells, protein, glucose, paraneoplastic panel -More alert and oriented today, confirmed by the son at the bedside  Seizure disorder - Continue IV Keppra and Tegretol  Diabetes insipidus with polyuria  - Patient had excessive urine output thought to be due to central diabetes insipidus due to abnormalities noted on the MRI brain. 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 Was on cefepime and Flagyl - Currently on Unasyn.  Nutrition - Much more alert and oriented today, SLP evaluation and possible start  feeding today - Currently has dobhoff with tube feeds  Essential hypertension - Currently stable, continue clonidine, hydralazine as needed  Diabetes mellitus type 2 - Continue sliding scale insulin, CBGs controlled  Enterococcus UTI - Patient was on Zosyn, transitioned to Unasyn  Code Status: full  DVT Prophylaxis:  SCD's Family Communication: Discussed in detail with the patient, all imaging results, lab results explained to the patient and son at the bedside  Disposition Plan: Continue stepdown unit  Time Spent in minutes   35 minutes  Procedures:  EEG  Consultants:   Pulmonary critical care  General surgery  Neurology  Oncology  Palliative care   Antimicrobials:   Unasyn   Medications  Scheduled Meds: . carbamazepine  150 mg Oral TID  . cloNIDine  0.1 mg Transdermal Weekly  . feeding supplement (PRO-STAT SUGAR FREE 64)  30 mL Per Tube TID  . folic acid  1 mg Intravenous Daily  . insulin aspart  0-15 Units Subcutaneous Q4H  . metoprolol tartrate  5 mg Intravenous Q6H  . potassium chloride  40 mEq Oral Daily  . sodium hypochlorite   Irrigation BID   Continuous Infusions: . ampicillin-sulbactam (UNASYN) IV Stopped (07/07/17 1053)  . dextrose 5 % with kcl 40 mL/hr at 07/04/17 1039  . feeding supplement (JEVITY 1.2 CAL) 1,000 mL (07/07/17 0321)  . levETIRAcetam Stopped (07/07/17 0333)   PRN Meds:.alum & mag hydroxide-simeth, hydrALAZINE, ondansetron **OR** ondansetron (ZOFRAN) IV, sodium chloride flush   Antibiotics   Anti-infectives    Start     Dose/Rate Route Frequency Ordered Stop   07/04/17 1100  Ampicillin-Sulbactam (UNASYN) 3 g in sodium chloride 0.9 % 100 mL IVPB     3 g 200 mL/hr over 30 Minutes Intravenous Every 6 hours 07/04/17 0823 07/10/17 1059   07/03/17 1600  Ampicillin-Sulbactam (UNASYN) 3 g in sodium chloride 0.9 % 100 mL IVPB  Status:  Discontinued     3 g 200 mL/hr over 30 Minutes Intravenous Every 6 hours 07/03/17 1047 07/04/17 0823     06/29/17 1400  ceFEPIme (MAXIPIME) 2 g in dextrose 5 % 50 mL IVPB  Status:  Discontinued     2 g 100 mL/hr over 30 Minutes Intravenous Every 8 hours 06/29/17 0853 06/29/17 0857   06/29/17 1000  ceFEPIme (MAXIPIME) 2 g in dextrose 5 % 50 mL IVPB  Status:  Discontinued     2 g 100 mL/hr over 30 Minutes Intravenous Every 8 hours 06/29/17 0857 07/03/17 1046   06/29/17 0930  metroNIDAZOLE (FLAGYL) IVPB 500 mg  Status:  Discontinued     500 mg 100 mL/hr over 60 Minutes Intravenous Every 8 hours 06/29/17 0838 06/30/17 0956   06/24/17 0600  piperacillin-tazobactam (ZOSYN) IVPB 3.375 g  Status:  Discontinued     3.375 g 12.5 mL/hr over 240 Minutes Intravenous Every 8 hours 06/23/17 2254 06/29/17 0838   06/23/17 2300  piperacillin-tazobactam (ZOSYN) IVPB 3.375 g     3.375 g 100 mL/hr over 30 Minutes Intravenous  Once 06/23/17 2254 06/24/17 0056  Subjective:   Sorrel Cassetta was seen and examined today.  Much more alert and oriented today, son at the bedside, denies any specific complaints. She states that she slept well.hungry wants to try some food today.  Patient denies dizziness, chest pain, shortness of breath. No acute events overnight.    Objective:   Vitals:   07/07/17 0800 07/07/17 0900 07/07/17 1100 07/07/17 1141  BP: 140/80 140/76 (!) 149/77 (!) 142/79  Pulse: 82 84 85 92  Resp: 19 (!) 23 18 (!) 21  Temp: 98.7 F (37.1 C)   97.8 F (36.6 C)  TempSrc: Oral   Oral  SpO2: 100% 100% 99% 100%  Weight:      Height:        Intake/Output Summary (Last 24 hours) at 07/07/17 1238 Last data filed at 07/07/17 1100  Gross per 24 hour  Intake             2763 ml  Output             1150 ml  Net             1613 ml     Wt Readings from Last 3 Encounters:  07/07/17 78.7 kg (173 lb 8 oz)  06/01/17 101 kg (222 lb 10.6 oz)  05/17/17 95.5 kg (210 lb 8.6 oz)     Exam  General: Alert and oriented x 3, ill-appearing, NAD  eyes: PERRLA, EOMI, Anicteric Sclera,  HEENT:   Atraumatic, normocephalic, normal oropharynx  Cardiovascular: S1 S2 auscultated, no rubs, murmurs or gallops. Regular rate and rhythm.  RespiratoryDiminished breath sound at the bases  Abdominal : Soft, nontender, nondistended, + bowel sounds, dressing intact   Ext: no pedal edema bilaterally  Neuro: alert and awake, speech improving, smiling   Musculoskeletal: No digital cyanosis, clubbing  Skin:  abdominal dressing intact  Psych : Normal affect and demeanor, alert and oriented x3    Data Reviewed:  I have personally reviewed following labs and imaging studies  Micro Results No results found for this or any previous visit (from the past 240 hour(s)).  Radiology Reports Ct Abdomen Pelvis Wo Contrast  Result Date: 06/23/2017 CLINICAL DATA:  GI bleed. Melena. History of cervical cancer. History of appendiceal cancer. EXAM: CT ABDOMEN AND PELVIS WITHOUT CONTRAST TECHNIQUE: Multidetector CT imaging of the abdomen and pelvis was performed following the standard protocol without IV contrast. COMPARISON:  05/15/2017 FINDINGS: Lower chest: The heart is enlarged. Tiny pericardial effusion is similar to prior. There is dependent atelectasis in the lower lungs with moderate right and small left pleural effusions. Hepatobiliary: No focal abnormality in the liver on this study without intravenous contrast. Gallbladder is decompressed. The possible tiny stone in the gallbladder neck. No intrahepatic or extrahepatic biliary dilation. Pancreas: No focal mass lesion. No dilatation of the main duct. No intraparenchymal cyst. No peripancreatic edema. Spleen: No splenomegaly. No focal mass lesion. Adrenals/Urinary Tract: No adrenal nodule or mass. Left kidney is atrophic as before. There is new bilateral mild hydroureteronephrosis. The bladder is distended. Tiny air bubble in the bladder lumen may be related to recent instrumentation although bladder infection can cause this appearance. Stomach/Bowel:  Stomach is mildly distended and fluid-filled. Duodenum unremarkable. No overt small bowel obstruction. The terminal ileum is normal. Suture line in the region of the mid transverse colon is compatible with the clinical history of enterocolostomy from the mid ileum to the mid transverse colon. Left colon unremarkable. There is edema a congestion of the small bowel  mesentery with small pockets of interloop mesenteric fluid. Vascular/Lymphatic: There is abdominal aortic atherosclerosis without aneurysm. There is no gastrohepatic or hepatoduodenal ligament lymphadenopathy. No intraperitoneal or retroperitoneal lymphadenopathy. No pelvic sidewall lymphadenopathy. Reproductive: Uterus appears enlarged. Endometrium is thickened in this patient with a history of cervical cancer. There is no adnexal mass. Other: Focal fluid collection is identified in the right lower quadrant. This is difficult to assess without oral or intravenous contrast material, but it appears to have a circumferential rim and does not appear to represent bowel. There is another fluid collection inferior to the liver tracking down the right peritoneal cavity and into the right para colic gutter. This collection measures 8.8 cm in craniocaudal length by 7.4 cm and AP dimension by 2.5 cm coronally. This also has a rim of increased attenuation raising concern for abscess. Musculoskeletal: Open midline wound identified with packing material. Diffuse body wall edema evident. Stable appearance sclerotic lesion right acetabulum. Bone windows reveal no worrisome lytic or sclerotic osseous lesions. IMPRESSION: 1. Patient is approximately 3 weeks out from diagnostic laparoscopy with abscess drainage, repair of small bowel perforation, and entero colostomy. On today's exam, there are 2 apparent fluid collections identified, but are difficult to assess given the lack of intravenous and oral contrast. One of these is just inferior to the liver in tracks down the  right paracolic gutter. The second is in the right lower quadrant mesentery. Given the history of recent surgery both could represent intraperitoneal abscess. However, with the history of appendiceal carcinoma, pseudomyxoma peritonei is also a consideration. 2. No evidence for bowel obstruction. No bowel wall thickening is evident. There is fairly diffuse mesenteric edema and vascular congestion with scattered collections of interloop mesenteric fluid. The degree of these changes is more advanced than would be expected 3 weeks out from surgery. 3. Dependent atelectasis in both lower lungs with moderate right and small left pleural effusions. 4. Tiny air bubble in the urinary bladder may be from recent instrumentation, but bladder infection can have this appearance. 5. Thickening of the endometrial stripe in this patient with history of cervical cancer. Electronically Signed   By: Misty Stanley M.D.   On: 06/23/2017 20:32   Ct Head Wo Contrast  Result Date: 07/04/2017 CLINICAL DATA:  Follow-up CVA EXAM: CT HEAD WITHOUT CONTRAST TECHNIQUE: Contiguous axial images were obtained from the base of the skull through the vertex without intravenous contrast. COMPARISON:  06/28/2017; brain MRI - 07/01/2017; 06/28/2017 FINDINGS: Brain: Re- demonstrated atrophy with sulcal prominence. Old right frontal lobe infarct with associated dystrophic calcifications. Old left cerebellar hemisphere infarct / postoperative change. Periventricular hypodensities compatible microvascular ischemic disease. Suspected punctate evolving lacunar infarct at previously suspected tiny left thalamic infarct (image 14, series 3). Given extensive background parenchymal abnormalities, there is no CT evidence superimposed acute large territory infarct. No intraparenchymal or extra-axial mass or hemorrhage as previously noted tiny bilateral subdural hematomas are not well demonstrated on the present examination. Vascular: Intracranial atherosclerosis.  Skull: Stable sequela of left suboccipital craniotomy. Old right frontal lobe burr hole. No displaced calvarial fracture. Sinuses/Orbits: Scattered opacification of the bilateral frontal sinuses as well as the left anterior ethmoidal air cells, unchanged. Unchanged punctate (approximately 0.5 cm) osteoid osteoma within the left anterior ethmoidal air cell (image 15, series 4). Remaining paranasal sinuses and mastoid air cells are normally aerated. No air-fluid levels. Other: Regional soft tissues appear normal. IMPRESSION: 1. Suspected evolving encephalomalacia at suspected left thalamic lacunar infarct. No superimposed acute large territory infarct. 2. Previously  identified tiny bilateral subdural hematoma as not definitely seen on the present examination. No new or enlarging intraparenchymal or extra-axial hemorrhage. 3. Stable sequela of prior right frontal lobe infarct. 4. Stable sequela of prior left occipital lobe infarct/postsurgical change. Electronically Signed   By: Sandi Mariscal M.D.   On: 07/04/2017 10:01   Mr Brain Wo Contrast  Result Date: 06/28/2017 CLINICAL DATA:  66 y/o F; patient presenting with increased lethargy, garbled speech, and facial droop. History seizures, hemorrhagic aneurysm, cervical and metastatic appendiceal carcinoma, and recent abdominal surgery. EXAM: MRI HEAD WITHOUT CONTRAST TECHNIQUE: Multiplanar, multiecho pulse sequences of the brain and surrounding structures were obtained without intravenous contrast. COMPARISON:  06/28/2017 CT of the head. FINDINGS: Brain: No diffusion signal abnormality to suggest acute or early subacute infarction. Stable left cerebellar hemisphere encephalomalacia. Stable right frontal lobe encephalomalacia and with central mineralization. Moderate diffuse brain parenchymal volume loss. Scattered foci of T2 FLAIR hyperintense signal abnormality in periventricular white matter are nonspecific but compatible with chronic microvascular ischemic changes.  There is diffusely increased susceptibility hypointensity throughout the vascular structures of the brain. Vascular: Normal flow voids. Skull and upper cervical spine: Normal marrow signal. Postsurgical changes related to a left suboccipital craniectomy. Sinuses/Orbits: Partial opacification of the left-greater-than-right frontal sinuses and left anterior ethmoid air cells. Other: None. IMPRESSION: 1. No acute intracranial abnormality identified. 2. Left cerebellar hemisphere encephalomalacia and right frontal lobe encephalomalacia with central mineralization. 3. Diffusely increased susceptibility hypointensity throughout vascular structures in the brain, question recent iron replacement therapy. A component of siderosis may also be present. 4. Partial opacification of left-greater-than-right frontal sinuses and left anterior ethmoid air cells. 5. Mild chronic microvascular ischemic changes and moderate parenchymal volume loss of the brain. Electronically Signed   By: Kristine Garbe M.D.   On: 06/28/2017 22:25   Mr Jeri Cos YQ Contrast  Result Date: 07/01/2017 CLINICAL DATA:  Encephalopathy. History of seizure disorder and metastatic appendiceal cancer. EXAM: MRI HEAD WITHOUT AND WITH CONTRAST TECHNIQUE: Multiplanar, multiecho pulse sequences of the brain and surrounding structures were obtained without and with intravenous contrast. CONTRAST:  20 mL MultiHance COMPARISON:  06/28/2017 noncontrast brain MRI FINDINGS: The study is severely motion degraded. Brain: There is a 2 mm focus of mildly increased trace diffusion signal and apparently reduced ADC in the dorsal thalamus on axial diffusion imaging, not confirmed on coronal images although assessment is limited by slice selection. No acute infarct is identified elsewhere. Chronic right frontal lobe encephalomalacia is again noted with central calcification. There is also chronic left cerebellar encephalomalacia. Scattered T2 hyperintensities elsewhere  in the cerebral white matter bilaterally are nonspecific but compatible with mild chronic small vessel ischemic disease. There is new abnormal T1 and FLAIR hyperintensity in the extra-axial space overlying the posterior cerebral convexities bilaterally most compatible with thin subdural hematomas measuring 2 mm in thickness each. There is no associated mass effect. A small amount of abnormal sulcal FLAIR hyperintensity at the vertex on the right (series 8, image 24) is favored to reflect artifactual incomplete FLAIR suppression due to regional susceptibility without evidence of subarachnoid hemorrhage elsewhere. There is moderate cerebral atrophy. No abnormal enhancement is identified to suggest metastatic disease. Vascular: Major intracranial vascular flow voids are preserved. Skull and upper cervical spine: Diffusely diminished bone marrow signal intensity likely related to patient's anemia. Sinuses/Orbits: Unremarkable orbits. Similar appearance of partial left frontal sinus opacification. No significant scratched a trace bilateral mastoid fluid. Other: None. IMPRESSION: 1. Severely motion degraded examination. 2. New tiny bilateral  subdural hematomas over both posterior cerebral convexities. No mass effect. 3. Minimal sulcal signal abnormality at the right vertex favored to reflect artifact rather than trace subarachnoid hemorrhage. 4. Punctate focus of diffusion abnormality in the dorsal thalamus, favor artifact over tiny infarct. 5. 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 6. No evidence of metastatic disease within limitations of motion. Critical Value/emergent results were called by telephone at the time of interpretation on 07/01/2017 at 2:01 pm to Dr. [place] Pink, who verbally acknowledged these results. Electronically Signed   By: Logan Bores M.D.   On: 07/01/2017 14:05   Ct Head Code Stroke Wo Contrast  Result Date: 06/28/2017 CLINICAL DATA:  Code stroke.  Left facial droop. EXAM: CT  HEAD WITHOUT CONTRAST TECHNIQUE: Contiguous axial images were obtained from the base of the skull through the vertex without intravenous contrast. COMPARISON:  06/17/2017 FINDINGS: Brain: A moderate-sized region of encephalomalacia with associated calcification in the high right frontal lobe is unchanged. Left cerebellar encephalomalacia underlying a craniectomy defect is also unchanged. There is no evidence of acute infarct, mass, midline shift, or extra-axial fluid collection. Cerebral atrophy is unchanged. Patchy bilateral cerebral white matter hypodensities are similar to the prior study and nonspecific but compatible with moderate chronic small vessel ischemic disease. Vascular: Calcified atherosclerosis at the skullbase. No hyperdense vessel. Skull: Left suboccipital craniectomy. Right frontal burr hole. No acute fracture or suspicious osseous lesion. Sinuses/Orbits: Slightly progressive left frontal sinus mucosal thickening. Clear mastoid air cells. Unremarkable orbits. Other: None. ASPECTS [facility] Stroke Program Early CT Score) Not scored due to chronic right frontal encephalomalacia changes. IMPRESSION: 1. No evidence of acute intracranial abnormality. 2. 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 Results sent via text page to Dr. Rory Percy on 06/28/2017 at 2:54 p.m. Electronically Signed   By: Logan Bores M.D.   On: 06/28/2017 14:54    Lab Data:  CBC:  Recent Labs Lab 07/03/17 0513 07/04/17 0510 07/05/17 0440 07/06/17 0500 07/07/17 0457  WBC 10.3 10.6* 10.7* 10.9* 10.0  HGB 7.9* 7.9* 8.0* 7.6* 7.4*  HCT 25.6* 25.7* 26.3* 24.8* 24.6*  MCV 88.3 87.4 88.9 89.5 91.8  PLT 406* 376 353 311 650   Basic Metabolic Panel:  Recent Labs Lab 07/03/17 0909 07/03/17 1554 07/04/17 0510 07/05/17 0440 07/06/17 0500 07/07/17 0457  NA  --  141 135 137 140 141  K  --  2.7* 3.0* 3.5 3.1* 3.3*  CL  --  100* 99* 102 107 109  CO2  --  34* 31 30 30 27   GLUCOSE  --  149* 141* 123*  116* 107*  BUN  --  7 5* 11 11 12   CREATININE  --  0.80 0.73 0.78 0.70 0.58  CALCIUM  --  7.9* 7.3* 7.1* 7.2* 7.4*  MG 1.0*  --  1.3*  --   --  1.4*   GFR: Estimated Creatinine Clearance: 76.2 mL/min (by C-G formula based on SCr of 0.58 mg/dL). Liver Function Tests: No results for input(s): AST, ALT, ALKPHOS, BILITOT, PROT, ALBUMIN in the last 168 hours. No results for input(s): LIPASE, AMYLASE in the last 168 hours.  Recent Labs Lab 07/02/17 1627  AMMONIA 30   Coagulation Profile:  Recent Labs Lab 07/05/17 1330  INR 1.62   Cardiac Enzymes: No results for input(s): CKTOTAL, CKMB, CKMBINDEX, TROPONINI in the last 168 hours. BNP (last 3 results) No results for input(s): PROBNP in the last 8760 hours. HbA1C: No results for input(s): HGBA1C in the last 72 hours. CBG:  Recent Labs Lab 07/06/17 1959 07/06/17 2331 07/07/17 0400 07/07/17 0759 07/07/17 1140  GLUCAP 119* 123* 109* 120* 122*   Lipid Profile: No results for input(s): CHOL, HDL, LDLCALC, TRIG, CHOLHDL, LDLDIRECT in the last 72 hours. Thyroid Function Tests: No results for input(s): TSH, T4TOTAL, FREET4, T3FREE, THYROIDAB in the last 72 hours. Anemia Panel: No results for input(s): VITAMINB12, FOLATE, FERRITIN, TIBC, IRON, RETICCTPCT in the last 72 hours. Urine analysis:    Component Value Date/Time   COLORURINE STRAW (A) 07/02/2017 1704   APPEARANCEUR CLEAR 07/02/2017 1704   LABSPEC 1.005 07/02/2017 1704   LABSPEC 1.010 08/17/2015 1542   PHURINE 8.0 07/02/2017 1704   GLUCOSEU NEGATIVE 07/02/2017 1704   GLUCOSEU 100 08/17/2015 1542   HGBUR SMALL (A) 07/02/2017 1704   BILIRUBINUR NEGATIVE 07/02/2017 1704   BILIRUBINUR Negative 08/17/2015 [place] 07/02/2017 1704   PROTEINUR NEGATIVE 07/02/2017 1704   UROBILINOGEN 1.0 08/26/2015 0725   UROBILINOGEN 0.2 08/17/2015 1542   NITRITE NEGATIVE 07/02/2017 1704   LEUKOCYTESUR NEGATIVE 07/02/2017 1704   LEUKOCYTESUR Trace 08/17/2015 1542      Jo Cerone M.D. [facility] 07/07/2017, 12:38 PM  Pager: [phone] Between 7am to 7pm - call Pager - [phone]  After 7pm go to [email or web address] - password [email or web address]  Call night coverage person covering after 7pm

## 2017-07-08 ENCOUNTER — Inpatient Hospital Stay (HOSPITAL_COMMUNITY): Payer: Medicare Other

## 2017-07-08 LAB — CBC
HCT: 24.7 % — ABNORMAL LOW (ref 36.0–46.0)
Hemoglobin: 7.4 g/dL — ABNORMAL LOW (ref 12.0–15.0)
MCH: 27.7 pg (ref 26.0–34.0)
MCHC: 30 g/dL (ref 30.0–36.0)
MCV: 92.5 fL (ref 78.0–100.0)
PLATELETS: 258 10*3/uL (ref 150–400)
RBC: 2.67 MIL/uL — AB (ref 3.87–5.11)
RDW: 18.5 % — ABNORMAL HIGH (ref 11.5–15.5)
WBC: 10.3 10*3/uL (ref 4.0–10.5)

## 2017-07-08 LAB — BASIC METABOLIC PANEL
Anion gap: 5 (ref 5–15)
BUN: 15 mg/dL (ref 6–20)
CHLORIDE: 114 mmol/L — AB (ref 101–111)
CO2: 27 mmol/L (ref 22–32)
CREATININE: 0.58 mg/dL (ref 0.44–1.00)
Calcium: 7.5 mg/dL — ABNORMAL LOW (ref 8.9–10.3)
Glucose, Bld: 136 mg/dL — ABNORMAL HIGH (ref 65–99)
Potassium: 4 mmol/L (ref 3.5–5.1)
SODIUM: 146 mmol/L — AB (ref 135–145)

## 2017-07-08 LAB — GLUCOSE, CAPILLARY
GLUCOSE-CAPILLARY: 125 mg/dL — AB (ref 65–99)
GLUCOSE-CAPILLARY: 125 mg/dL — AB (ref 65–99)
GLUCOSE-CAPILLARY: 116 mg/dL — AB (ref 65–99)
GLUCOSE-CAPILLARY: 116 mg/dL — AB (ref 65–99)
GLUCOSE-CAPILLARY: 111 mg/dL — AB (ref 65–99)
Glucose-Capillary: 113 mg/dL — ABNORMAL HIGH (ref 65–99)

## 2017-07-08 NOTE — Progress Notes (Signed)
Reason for consult: Encephalopathy, seizures  Subjective: The patient was alert today and following commands. She is oriented to person place and very interactive. She kept demanding having coffee and was teasing me that I better make sure that she is able to drink coffee. She is remarkably improved mental status over the last 2 days  ROS: negative except above Unable to obtain due to poor mental status  Examination  Vital signs in last 24 hours: Temp:  [97.6 F (36.4 C)-99 F (37.2 C)] 98 F (36.7 C) (08/05 1558) Pulse Rate:  [89-99] 91 (08/05 1558) Resp:  [16-22] 16 (08/05 1500) BP: (139-158)/(80-87) 158/86 (08/05 1558) SpO2:  [93 %-100 %] 100 % (08/05 1558) Weight:  [73.2 kg (161 lb 6 oz)] 73.2 kg (161 lb 6 oz) (08/05 0401)  General: Not in distress, cooperative CVS: pulse-normal rate and rhythm RS: breathing comfortably Extremities: normal   Neuro: MS: Alert, oriented, follows commands CN: pupils equal and reactive,  EOMI, face symmetric, tongue midline, normal sensation over face, Motor:2/5 strength over left UE and LE, 4/5 over R UE and LUE  Reflexes: 3+ bilaterally over left  patella, biceps, plantars: flexor Coordination: grossly normal  Gait: not testedal  Basic Metabolic Panel:  Recent Labs Lab 07/03/17 0909  07/04/17 0510 07/05/17 0440 07/06/17 0500 07/07/17 0457 07/08/17 1158  NA  --   < > 135 137 140 141 146*  K  --   < > 3.0* 3.5 3.1* 3.3* 4.0  CL  --   < > 99* 102 107 109 114*  CO2  --   < > 31 30 30 27 27   GLUCOSE  --   < > 141* 123* 116* 107* 136*  BUN  --   < > 5* 11 11 12 15   CREATININE  --   < > 0.73 0.78 0.70 0.58 0.58  CALCIUM  --   < > 7.3* 7.1* 7.2* 7.4* 7.5*  MG 1.0*  --  1.3*  --   --  1.4*  --   < > = values in this interval not displayed.  CBC:  Recent Labs Lab 07/04/17 0510 07/05/17 0440 07/06/17 0500 07/07/17 0457 07/08/17 1158  WBC 10.6* 10.7* 10.9* 10.0 10.3  HGB 7.9* 8.0* 7.6* 7.4* 7.4*  HCT 25.7* 26.3* 24.8* 24.6*  24.7*  MCV 87.4 88.9 89.5 91.8 92.5  PLT 376 353 311 258 258     Coagulation Studies: No results for input(s): LABPROT, INR in the last 72 hours.  Imaging Reviewed:     ASSESSMENT AND PLAN  Metabolic and SepticEncephalopathy Seizures Metastatic Cancer stage IV Diabetes insipidus Rectal bleeding and anemia UTI and intrabdominal abscesses ?Bilateral small SDH, ? Thalamic Stroke   This is a 66 year old female with a past medical history significant for metastatic adenocarcinoma of the appendix, CVA with left sided deficits and known seizure disorder was brought in for GIB and subsequently found to have intra-abdominal abscesses. She was coded "stroke" on 07/13/23 increased lethargy, garbled speech and a facial droop.Neurology was consulted for evaluation - favored to be a seizure as CT/MRI both without acute abnormality ( MRI did show restricted diffusion in left thalamus thought to be artifactual). EEG on 7/27 did not show epileptiform activity.   Patient was still lethargic, we placed VEEG on 07/04/17. The EEG did not show electrographic seizures however showed periodic lateralized discharges in the ictal interictal pattern. The load of the patient additional dose of Tegretol and resumed the Tegretol which was not  being given due to the absence of a feeding tube. The EEG has shown improving encephalopathy and was disconnected.   Repeat CT head was stable and demonstrated a small hypodensity in the left thalamus as well as MR showed bilateral mild SDH not visible on repeat CT head.  We had initially,  decided to pursue with a lumbar puncture to rule out carcinomatous meningitis after having a discussion with the hospitalist, however as given patient is clinically improved we'll defer her primary team whether they would still like to pursue a lumbar puncture to rule out carcinomatous meningitis and paraneoplastic syndrome.  Recommendations Continue IV Keppra and Tegretol for seizure  prophylaxis.  Continue to treat infection and metabolic conditions  As patient is improved and likely case of mental status was subclinical seizures, I have no longer feel strongly about obtaining LP.  Continue to treat medically metabolic conditions and minimize pain medications and sedation  Neurology will sign off, however if any questions please reconsult [place]. The patient will need follow-up on discharge.

## 2017-07-08 NOTE — Progress Notes (Signed)
Modified Barium Swallow Progress Note  Patient Details  Name: Lori [name] MRN: [id number] Date of Birth: 15-Mar-1951  Today's Date: 07/08/2017  Modified Barium Swallow completed.  Full report located under Chart Review in the Imaging Section.  Brief recommendations include the following:  Clinical Impression  Patient presents with moderate oral and mild pharyngeal phase dysphagia which appears cognitively and sensory-based. Oral stage of swallowing notable for intermittent oral holding, delayed oral transit, impaired mastication and weak lingual manipulation. Swallow initiation was delayed to level of the pyriform sinuses with thin liquids, resulting in deep penetration before the swallow with larger cup sips of thin liquids, and silent aspiration x1 with straw sip. Cued throat clear sufficient for airway clearance. Adequate tongue base retraction and pharyngeal constriction; noted sluggish hyolaryngeal excursion which intermittently slowed bolus passage through the UES. No abnormal residue remained in the pharynx after the swallow with any consistency trialed. Recommend initiating dys 2 diet with thin liquids, no straws, full supervision to monitor for small bites/sips. Cue pt for small, single sips of liquid, clear throat intermittently. SLP will monitor for tolerance, advancement of solids. Pt may benefit from removal of cortrak to facilitate comfort with PO intake.    Swallow Evaluation Recommendations       SLP Diet Recommendations: Dysphagia 2 (Fine chop) solids;Thin liquid   Liquid Administration via: Cup;No straw   Medication Administration: Whole meds with puree   Supervision: Full supervision/cueing for compensatory strategies   Compensations: Slow rate;Small sips/bites;Clear throat intermittently   Postural Changes: Seated upright at 90 degrees   Oral Care Recommendations: Oral care BID       Deneise Lever, MS, CCC-SLP Speech-Language Pathologist [place] 07/08/2017,3:03 PM

## 2017-07-08 NOTE — Progress Notes (Signed)
SLP Cancellation Note  Patient Details Name: Lori [name] MRN: [id number] DOB: [birth year]   Cancelled treatment:       Reason Eval/Treat Not Completed: Other (comment). Spoke with RN, pt fully alert today and requesting PO. Will proceed with MBS.  Deneise Lever, [place], [place] Speech-Language Pathologist [phone]  Aliene Altes 07/08/2017, 11:36 AM

## 2017-07-08 NOTE — Progress Notes (Signed)
[facility]                                                                              Patient Demographics  Lori [name], is a 66 y.o. female, DOB - 01-30-1951, NOI:[id number]  Admit date - 06/23/2017   Admitting Physician Sid Falcon, MD  Outpatient Primary MD for the patient is [name], MD  Outpatient specialists:   LOS - 15  days   Medical records reviewed and are as summarized below:    Chief Complaint  Patient presents with  . 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 She was at a skilled nursing facility. She was recently hospitalized for small bowel obstruction and underwent diagnostic laparoscopy in June. She underwent repair of small bowel perforation. She had a prolonged hospital stay. Subsequently was discharged to skilled nursing facility. Presented back with rectal bleeding. Seen by oncology. Not thought to be a candidate for any treatment. Then she had what appears to be a seizure resulting in encephalopathy. Neurology was consulted. Palliative medicine continues to follow. Family still desires aggressive care. Patient was transferred to stepdown unit. Critical care medicine was consulted. Patient noted to have profuse urine output. 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   Assessment & Plan    Acute metabolic encephalopathy, acute CVA left thalamic stroke, bilateral small SDH - Garbled speech, lethargy and facial drooping noted on 7/26, neurology consult was obtained as code stroke. CT head showed evolving encephalomalacia, suspected left thalamic lacunar infarct. Previously identified tiny bilateral subdural hematoma. - MRI of the brain 7/29 showed no new tiny bilateral subdural  hematomas, trace subarachnoid hemorrhage, punctate focus of diffusion abnormality in the dorsal thalamus. - Patient was placed on EEG on 8/1, did not show electrographic seizures however showed periodic lateralized discharges in the interictal pattern. Patient was given Keppra loading dose. - Continue IV Keppra and Tegretol -Much more alert and awake, neurology following  Seizure disorder - Continue IV Keppra and Tegretol  Diabetes insipidus with polyuria  - Patient had excessive urine output thought to be due to central diabetes insipidus due to abnormalities noted on the MRI brain. 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 Was on cefepime and Flagyl - Currently on Unasyn.  Nutrition - Much more alert and oriented today, SLP evaluation and possible start feeding today - Currently has dobhoff with tube feeds  Essential hypertension - Currently stable, continue clonidine, hydralazine as needed  Diabetes  mellitus type 2 - CBGs controlled, continue sliding scale insulin   Enterococcus UTI - Patient was on Zosyn, transitioned to Unasyn  Code Status: full  DVT Prophylaxis:  SCD's Family Communication: Discussed in detail with the patient, all imaging results, lab results explained to the patient and son at the bedside  Disposition Plan: Continue stepdown unit  Time Spent in minutes   35 minutes  Procedures:  EEG  Consultants:   Pulmonary critical care  General surgery  Neurology  Oncology  Palliative care   Antimicrobials:   Unasyn   Medications  Scheduled Meds: . carbamazepine  150 mg Oral TID  . cloNIDine  0.1 mg Transdermal Weekly  . feeding supplement (PRO-STAT SUGAR FREE 64)  30 mL Per Tube TID  . folic acid  1 mg Intravenous Daily  . insulin aspart  0-15 Units Subcutaneous Q4H  . metoprolol tartrate  5 mg Intravenous Q6H  . potassium chloride  40 mEq Oral Daily  . sodium hypochlorite   Irrigation BID   Continuous Infusions: . ampicillin-sulbactam (UNASYN) IV 3 g (07/08/17 1109)  . dextrose 5 % with kcl 40 mL/hr at 07/04/17 1039  . feeding supplement (JEVITY 1.2 CAL) 1,000 mL (07/07/17 2231)  . levETIRAcetam Stopped (07/08/17 0340)   PRN Meds:.alum & mag hydroxide-simeth, hydrALAZINE, ondansetron **OR** ondansetron (ZOFRAN) IV, sodium chloride flush   Antibiotics   Anti-infectives    Start     Dose/Rate Route Frequency Ordered Stop   07/04/17 1100  Ampicillin-Sulbactam (UNASYN) 3 g in sodium chloride 0.9 % 100 mL IVPB     3 g 200 mL/hr over 30 Minutes Intravenous Every 6 hours 07/04/17 0823 07/10/17 1059   07/03/17 1600  Ampicillin-Sulbactam (UNASYN) 3 g in sodium chloride 0.9 % 100 mL IVPB  Status:  Discontinued     3 g 200 mL/hr over 30 Minutes Intravenous Every 6 hours 07/03/17 1047 07/04/17 0823   06/29/17 1400  ceFEPIme (MAXIPIME) 2 g in dextrose 5 % 50 mL IVPB  Status:  Discontinued     2 g 100 mL/hr over 30 Minutes Intravenous Every 8 hours  06/29/17 0853 06/29/17 0857   06/29/17 1000  ceFEPIme (MAXIPIME) 2 g in dextrose 5 % 50 mL IVPB  Status:  Discontinued     2 g 100 mL/hr over 30 Minutes Intravenous Every 8 hours 06/29/17 0857 07/03/17 1046   06/29/17 0930  metroNIDAZOLE (FLAGYL) IVPB 500 mg  Status:  Discontinued     500 mg 100 mL/hr over 60 Minutes Intravenous Every 8 hours 06/29/17 0838 06/30/17 0956   06/24/17 0600  piperacillin-tazobactam (ZOSYN) IVPB 3.375 g  Status:  Discontinued     3.375 g 12.5 mL/hr over 240 Minutes Intravenous Every 8 hours 06/23/17 2254 06/29/17 0838   06/23/17 2300  piperacillin-tazobactam (ZOSYN) IVPB 3.375 g     3.375 g 100 mL/hr over 30 Minutes Intravenous  Once 06/23/17 2254 06/24/17 0056        Subjective:   Briseis Aguilera was seen and examined today.  Alert and oriented, denies any specific complaints, wants to  eat. Patient denies dizziness, chest pain, shortness of breath. No acute events overnight.    Objective:   Vitals:   07/07/17 2300 07/08/17 0401 07/08/17 0723 07/08/17 1100  BP: (!) 142/86 139/80 (!) 146/87 (!) 150/84  Pulse: 99 95 90   Resp: (!) 22 (!) 22 19 19   Temp: 97.6 F (36.4 C) 99 F (37.2 C) 98.7 F (37.1 C) 98.4 F (36.9 C)  TempSrc: Oral Oral Axillary Oral  SpO2: 94% 93%  100%  Weight:  73.2 kg (161 lb 6 oz)    Height:        Intake/Output Summary (Last 24 hours) at 07/08/17 1156 Last data filed at 07/08/17 0800  Gross per 24 hour  Intake             2555 ml  Output              800 ml  Net             1755 ml     Wt Readings from Last 3 Encounters:  07/08/17 73.2 kg (161 lb 6 oz)  06/01/17 101 kg (222 lb 10.6 oz)  05/17/17 95.5 kg (210 lb 8.6 oz)     Exam Physical Exam  General: Alert and oriented x 3,  Eyes:   HEENT:   Cardiovascular: S1 S2 auscultated, no rubs, murmurs or gallops. Regular rate and rhythm.   Respiratory: Clear to auscultation bilaterally, no wheezing, rales or rhonchi  Gastrointestinal: Soft, nontender,  nondistended, + bowel sounds  Ext: no pedal edema bilaterally  Neuro: no new deficits  Musculoskeletal:   Skin:   Psych: Normal affect and demeanor, alert and oriented x3    Data Reviewed:  I have personally reviewed following labs and imaging studies  Micro Results No results found for this or any previous visit (from the past 240 hour(s)).  Radiology Reports Ct Abdomen Pelvis Wo Contrast  Result Date: 06/23/2017 CLINICAL DATA:  GI bleed. Melena. History of cervical cancer. History of appendiceal cancer. EXAM: CT ABDOMEN AND PELVIS WITHOUT CONTRAST TECHNIQUE: Multidetector CT imaging of the abdomen and pelvis was performed following the standard protocol without IV contrast. COMPARISON:  05/15/2017 FINDINGS: Lower chest: The heart is enlarged. Tiny pericardial effusion is similar to prior. There is dependent atelectasis in the lower lungs with moderate right and small left pleural effusions. Hepatobiliary: No focal abnormality in the liver on this study without intravenous contrast. Gallbladder is decompressed. The possible tiny stone in the gallbladder neck. No intrahepatic or extrahepatic biliary dilation. Pancreas: No focal mass lesion. No dilatation of the main duct. No intraparenchymal cyst. No peripancreatic edema. Spleen: No splenomegaly. No focal mass lesion. Adrenals/Urinary Tract: No adrenal nodule or mass. Left kidney is atrophic as before. There is new bilateral mild hydroureteronephrosis. The bladder is distended. Tiny air bubble in the bladder lumen may be related to recent instrumentation although bladder infection can cause this appearance. Stomach/Bowel: Stomach is mildly distended and fluid-filled. Duodenum unremarkable. No overt small bowel obstruction. The terminal ileum is normal. Suture line in the region of the mid transverse colon is compatible with the clinical history of enterocolostomy from the mid ileum to the mid transverse colon. Left colon unremarkable. There is  edema a congestion of the small bowel mesentery with small pockets of interloop mesenteric fluid. Vascular/Lymphatic: There is abdominal aortic atherosclerosis without aneurysm. There is no gastrohepatic or hepatoduodenal ligament lymphadenopathy. No intraperitoneal or retroperitoneal lymphadenopathy. No pelvic sidewall lymphadenopathy. Reproductive: Uterus appears enlarged. Endometrium is thickened in  this patient with a history of cervical cancer. There is no adnexal mass. Other: Focal fluid collection is identified in the right lower quadrant. This is difficult to assess without oral or intravenous contrast material, but it appears to have a circumferential rim and does not appear to represent bowel. There is another fluid collection inferior to the liver tracking down the right peritoneal cavity and into the right para colic gutter. This collection measures 8.8 cm in craniocaudal length by 7.4 cm and AP dimension by 2.5 cm coronally. This also has a rim of increased attenuation raising concern for abscess. Musculoskeletal: Open midline wound identified with packing material. Diffuse body wall edema evident. Stable appearance sclerotic lesion right acetabulum. Bone windows reveal no worrisome lytic or sclerotic osseous lesions. IMPRESSION: 1. Patient is approximately 3 weeks out from diagnostic laparoscopy with abscess drainage, repair of small bowel perforation, and entero colostomy. On today's exam, there are 2 apparent fluid collections identified, but are difficult to assess given the lack of intravenous and oral contrast. One of these is just inferior to the liver in tracks down the right paracolic gutter. The second is in the right lower quadrant mesentery. Given the history of recent surgery both could represent intraperitoneal abscess. However, with the history of appendiceal carcinoma, pseudomyxoma peritonei is also a consideration. 2. No evidence for bowel obstruction. No bowel wall thickening is  evident. There is fairly diffuse mesenteric edema and vascular congestion with scattered collections of interloop mesenteric fluid. The degree of these changes is more advanced than would be expected 3 weeks out from surgery. 3. Dependent atelectasis in both lower lungs with moderate right and small left pleural effusions. 4. Tiny air bubble in the urinary bladder may be from recent instrumentation, but bladder infection can have this appearance. 5. Thickening of the endometrial stripe in this patient with history of cervical cancer. Electronically Signed   By: Misty Stanley M.D.   On: 06/23/2017 20:32   Ct Head Wo Contrast  Result Date: 07/04/2017 CLINICAL DATA:  Follow-up CVA EXAM: CT HEAD WITHOUT CONTRAST TECHNIQUE: Contiguous axial images were obtained from the base of the skull through the vertex without intravenous contrast. COMPARISON:  06/28/2017; brain MRI - 07/01/2017; 06/28/2017 FINDINGS: Brain: Re- demonstrated atrophy with sulcal prominence. Old right frontal lobe infarct with associated dystrophic calcifications. Old left cerebellar hemisphere infarct / postoperative change. Periventricular hypodensities compatible microvascular ischemic disease. Suspected punctate evolving lacunar infarct at previously suspected tiny left thalamic infarct (image 14, series 3). Given extensive background parenchymal abnormalities, there is no CT evidence superimposed acute large territory infarct. No intraparenchymal or extra-axial mass or hemorrhage as previously noted tiny bilateral subdural hematomas are not well demonstrated on the present examination. Vascular: Intracranial atherosclerosis. Skull: Stable sequela of left suboccipital craniotomy. Old right frontal lobe burr hole. No displaced calvarial fracture. Sinuses/Orbits: Scattered opacification of the bilateral frontal sinuses as well as the left anterior ethmoidal air cells, unchanged. Unchanged punctate (approximately 0.5 cm) osteoid osteoma within the  left anterior ethmoidal air cell (image 15, series 4). Remaining paranasal sinuses and mastoid air cells are normally aerated. No air-fluid levels. Other: Regional soft tissues appear normal. IMPRESSION: 1. Suspected evolving encephalomalacia at suspected left thalamic lacunar infarct. No superimposed acute large territory infarct. 2. Previously identified tiny bilateral subdural hematoma as not definitely seen on the present examination. No new or enlarging intraparenchymal or extra-axial hemorrhage. 3. Stable sequela of prior right frontal lobe infarct. 4. Stable sequela of prior left occipital lobe infarct/postsurgical change. Electronically  Signed   By: Sandi Mariscal M.D.   On: 07/04/2017 10:01   Mr Brain Wo Contrast  Result Date: 06/28/2017 CLINICAL DATA:  66 y/o F; patient presenting with increased lethargy, garbled speech, and facial droop. History seizures, hemorrhagic aneurysm, cervical and metastatic appendiceal carcinoma, and recent abdominal surgery. EXAM: MRI HEAD WITHOUT CONTRAST TECHNIQUE: Multiplanar, multiecho pulse sequences of the brain and surrounding structures were obtained without intravenous contrast. COMPARISON:  06/28/2017 CT of the head. FINDINGS: Brain: No diffusion signal abnormality to suggest acute or early subacute infarction. Stable left cerebellar hemisphere encephalomalacia. Stable right frontal lobe encephalomalacia and with central mineralization. Moderate diffuse brain parenchymal volume loss. Scattered foci of T2 FLAIR hyperintense signal abnormality in periventricular white matter are nonspecific but compatible with chronic microvascular ischemic changes. There is diffusely increased susceptibility hypointensity throughout the vascular structures of the brain. Vascular: Normal flow voids. Skull and upper cervical spine: Normal marrow signal. Postsurgical changes related to a left suboccipital craniectomy. Sinuses/Orbits: Partial opacification of the left-greater-than-right  frontal sinuses and left anterior ethmoid air cells. Other: None. IMPRESSION: 1. No acute intracranial abnormality identified. 2. Left cerebellar hemisphere encephalomalacia and right frontal lobe encephalomalacia with central mineralization. 3. Diffusely increased susceptibility hypointensity throughout vascular structures in the brain, question recent iron replacement therapy. A component of siderosis may also be present. 4. Partial opacification of left-greater-than-right frontal sinuses and left anterior ethmoid air cells. 5. Mild chronic microvascular ischemic changes and moderate parenchymal volume loss of the brain. Electronically Signed   By: Kristine Garbe M.D.   On: 06/28/2017 22:25   Mr Jeri Cos JA Contrast  Result Date: 07/01/2017 CLINICAL DATA:  Encephalopathy. History of seizure disorder and metastatic appendiceal cancer. EXAM: MRI HEAD WITHOUT AND WITH CONTRAST TECHNIQUE: Multiplanar, multiecho pulse sequences of the brain and surrounding structures were obtained without and with intravenous contrast. CONTRAST:  20 mL MultiHance COMPARISON:  06/28/2017 noncontrast brain MRI FINDINGS: The study is severely motion degraded. Brain: There is a 2 mm focus of mildly increased trace diffusion signal and apparently reduced ADC in the dorsal thalamus on axial diffusion imaging, not confirmed on coronal images although assessment is limited by slice selection. No acute infarct is identified elsewhere. Chronic right frontal lobe encephalomalacia is again noted with central calcification. There is also chronic left cerebellar encephalomalacia. Scattered T2 hyperintensities elsewhere in the cerebral white matter bilaterally are nonspecific but compatible with mild chronic small vessel ischemic disease. There is new abnormal T1 and FLAIR hyperintensity in the extra-axial space overlying the posterior cerebral convexities bilaterally most compatible with thin subdural hematomas measuring 2 mm in  thickness each. There is no associated mass effect. A small amount of abnormal sulcal FLAIR hyperintensity at the vertex on the right (series 8, image 24) is favored to reflect artifactual incomplete FLAIR suppression due to regional susceptibility without evidence of subarachnoid hemorrhage elsewhere. There is moderate cerebral atrophy. No abnormal enhancement is identified to suggest metastatic disease. Vascular: Major intracranial vascular flow voids are preserved. Skull and upper cervical spine: Diffusely diminished bone marrow signal intensity likely related to patient's anemia. Sinuses/Orbits: Unremarkable orbits. Similar appearance of partial left frontal sinus opacification. No significant scratched a trace bilateral mastoid fluid. Other: None. IMPRESSION: 1. Severely motion degraded examination. 2. New tiny bilateral subdural hematomas over both posterior cerebral convexities. No mass effect. 3. Minimal sulcal signal abnormality at the right vertex favored to reflect artifact rather than trace subarachnoid hemorrhage. 4. Punctate focus of diffusion abnormality in the dorsal thalamus, favor artifact over  tiny infarct. 5. 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 6. No evidence of metastatic disease within limitations of motion. Critical Value/emergent results were called by telephone at the time of interpretation on 07/01/2017 at 2:01 pm to Dr. [place] Pink, who verbally acknowledged these results. Electronically Signed   By: Logan Bores M.D.   On: 07/01/2017 14:05   Ct Head Code Stroke Wo Contrast  Result Date: 06/28/2017 CLINICAL DATA:  Code stroke.  Left facial droop. EXAM: CT HEAD WITHOUT CONTRAST TECHNIQUE: Contiguous axial images were obtained from the base of the skull through the vertex without intravenous contrast. COMPARISON:  06/17/2017 FINDINGS: Brain: A moderate-sized region of encephalomalacia with associated calcification in the high right frontal lobe is unchanged. Left  cerebellar encephalomalacia underlying a craniectomy defect is also unchanged. There is no evidence of acute infarct, mass, midline shift, or extra-axial fluid collection. Cerebral atrophy is unchanged. Patchy bilateral cerebral white matter hypodensities are similar to the prior study and nonspecific but compatible with moderate chronic small vessel ischemic disease. Vascular: Calcified atherosclerosis at the skullbase. No hyperdense vessel. Skull: Left suboccipital craniectomy. Right frontal burr hole. No acute fracture or suspicious osseous lesion. Sinuses/Orbits: Slightly progressive left frontal sinus mucosal thickening. Clear mastoid air cells. Unremarkable orbits. Other: None. ASPECTS [facility] Stroke Program Early CT Score) Not scored due to chronic right frontal encephalomalacia changes. IMPRESSION: 1. No evidence of acute intracranial abnormality. 2. 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 Results sent via text page to Dr. Rory Percy on 06/28/2017 at 2:54 p.m. Electronically Signed   By: Logan Bores M.D.   On: 06/28/2017 14:54    Lab Data:  CBC:  Recent Labs Lab 07/03/17 0513 07/04/17 0510 07/05/17 0440 07/06/17 0500 07/07/17 0457  WBC 10.3 10.6* 10.7* 10.9* 10.0  HGB 7.9* 7.9* 8.0* 7.6* 7.4*  HCT 25.6* 25.7* 26.3* 24.8* 24.6*  MCV 88.3 87.4 88.9 89.5 91.8  PLT 406* 376 353 311 621   Basic Metabolic Panel:  Recent Labs Lab 07/03/17 0909 07/03/17 1554 07/04/17 0510 07/05/17 0440 07/06/17 0500 07/07/17 0457  NA  --  141 135 137 140 141  K  --  2.7* 3.0* 3.5 3.1* 3.3*  CL  --  100* 99* 102 107 109  CO2  --  34* 31 30 30 27   GLUCOSE  --  149* 141* 123* 116* 107*  BUN  --  7 5* 11 11 12   CREATININE  --  0.80 0.73 0.78 0.70 0.58  CALCIUM  --  7.9* 7.3* 7.1* 7.2* 7.4*  MG 1.0*  --  1.3*  --   --  1.4*   GFR: Estimated Creatinine Clearance: 69.8 mL/min (by C-G formula based on SCr of 0.58 mg/dL). Liver Function Tests: No results for input(s): AST, ALT,  ALKPHOS, BILITOT, PROT, ALBUMIN in the last 168 hours. No results for input(s): LIPASE, AMYLASE in the last 168 hours.  Recent Labs Lab 07/02/17 1627  AMMONIA 30   Coagulation Profile:  Recent Labs Lab 07/05/17 1330  INR 1.62   Cardiac Enzymes: No results for input(s): CKTOTAL, CKMB, CKMBINDEX, TROPONINI in the last 168 hours. BNP (last 3 results) No results for input(s): PROBNP in the last 8760 hours. HbA1C: No results for input(s): HGBA1C in the last 72 hours. CBG:  Recent Labs Lab 07/07/17 1626 07/07/17 1957 07/07/17 2307 07/08/17 0404 07/08/17 0800  GLUCAP 116* 128* 117* 125* 113*   Lipid Profile: No results for input(s): CHOL, HDL, LDLCALC, TRIG, CHOLHDL, LDLDIRECT in the last 72 hours. Thyroid Function  Tests: No results for input(s): TSH, T4TOTAL, FREET4, T3FREE, THYROIDAB in the last 72 hours. Anemia Panel: No results for input(s): VITAMINB12, FOLATE, FERRITIN, TIBC, IRON, RETICCTPCT in the last 72 hours. Urine analysis:    Component Value Date/Time   COLORURINE STRAW (A) 07/02/2017 1704   APPEARANCEUR CLEAR 07/02/2017 1704   LABSPEC 1.005 07/02/2017 1704   LABSPEC 1.010 08/17/2015 1542   PHURINE 8.0 07/02/2017 1704   GLUCOSEU NEGATIVE 07/02/2017 1704   GLUCOSEU 100 08/17/2015 1542   HGBUR SMALL (A) 07/02/2017 1704   BILIRUBINUR NEGATIVE 07/02/2017 1704   BILIRUBINUR Negative 08/17/2015 [place] 07/02/2017 1704   PROTEINUR NEGATIVE 07/02/2017 1704   UROBILINOGEN 1.0 08/26/2015 0725   UROBILINOGEN 0.2 08/17/2015 1542   NITRITE NEGATIVE 07/02/2017 1704   LEUKOCYTESUR NEGATIVE 07/02/2017 1704   LEUKOCYTESUR Trace 08/17/2015 1542     Geriann Lafont M.D. [facility] 07/08/2017, 11:56 AM  Pager: [phone] Between 7am to 7pm - call Pager - [phone]  After 7pm go to [email or web address] - password [email or web address]  Call night coverage person covering after 7pm

## 2017-07-09 LAB — CBC
HCT: 25.2 % — ABNORMAL LOW (ref 36.0–46.0)
Hemoglobin: 7.4 g/dL — ABNORMAL LOW (ref 12.0–15.0)
MCH: 27 pg (ref 26.0–34.0)
MCHC: 29.4 g/dL — AB (ref 30.0–36.0)
MCV: 92 fL (ref 78.0–100.0)
PLATELETS: 271 10*3/uL (ref 150–400)
RBC: 2.74 MIL/uL — AB (ref 3.87–5.11)
RDW: 18.3 % — ABNORMAL HIGH (ref 11.5–15.5)
WBC: 10 10*3/uL (ref 4.0–10.5)

## 2017-07-09 LAB — BASIC METABOLIC PANEL
Anion gap: 4 — ABNORMAL LOW (ref 5–15)
BUN: 14 mg/dL (ref 6–20)
CALCIUM: 7.7 mg/dL — AB (ref 8.9–10.3)
CO2: 26 mmol/L (ref 22–32)
CREATININE: 0.55 mg/dL (ref 0.44–1.00)
Chloride: 113 mmol/L — ABNORMAL HIGH (ref 101–111)
GFR calc non Af Amer: >60 mL/min (ref >60)
Glucose, Bld: 132 mg/dL — ABNORMAL HIGH (ref 65–99)
Potassium: 3.6 mmol/L (ref 3.5–5.1)
SODIUM: 143 mmol/L (ref 135–145)

## 2017-07-09 LAB — GLUCOSE, CAPILLARY
GLUCOSE-CAPILLARY: 133 mg/dL — AB (ref 65–99)
GLUCOSE-CAPILLARY: 119 mg/dL — AB (ref 65–99)
GLUCOSE-CAPILLARY: 110 mg/dL — AB (ref 65–99)
GLUCOSE-CAPILLARY: 117 mg/dL — AB (ref 65–99)
Glucose-Capillary: 120 mg/dL — ABNORMAL HIGH (ref 65–99)

## 2017-07-09 MED ORDER — RESOURCE THICKENUP CLEAR PO POWD
ORAL | Status: DC | PRN
  Filled 2017-07-09 (×2): qty 125

## 2017-07-09 NOTE — Progress Notes (Signed)
[facility] Progress Note     Subjective: CC: abdominal wound Patient very sleepy this morning but easily aroused. She denies abdominal pain, n/v. Reports she is having BMs, tolerating tube feeds.   Objective: Vital signs in last 24 hours: Temp:  [97.6 F (36.4 C)-98.6 F (37 C)] 97.7 F (36.5 C) (08/06 0719) Pulse Rate:  [88-98] 88 (08/06 0719) Resp:  [11-19] 11 (08/06 0719) BP: (140-158)/(82-90) 140/90 (08/06 0406) SpO2:  [100 %] 100 % (08/06 0719) Weight:  [79.1 kg (174 lb 6.1 oz)] 79.1 kg (174 lb 6.1 oz) (08/06 0406) Last BM Date: 07/08/17 (flexiseal)  Intake/Output from previous day: 08/05 0701 - 08/06 0700 In: 3190 [P.O.:290; I.V.:960; NG/GT:1320; IV Piggyback:620] Out: 1100 [Urine:1100] Intake/Output this shift: No intake/output data recorded.  PE: Gen:  Lethargic but easily aroused, NAD, pleasant Card:  Regular rate and rhythm, pedal pulses 2+ BL Pulm:  Normal effort, clear to auscultation bilaterally Abd: Soft, non-tender, non-distended, bowel sounds present  Midline wound:    Skin: warm and dry, no rashes  Psych: A&Ox3   Lab Results:   Recent Labs  07/08/17 1158 07/09/17 0412  WBC 10.3 10.0  HGB 7.4* 7.4*  HCT 24.7* 25.2*  PLT 258 271   BMET  Recent Labs  07/08/17 1158 07/09/17 0412  NA 146* 143  K 4.0 3.6  CL 114* 113*  CO2 27 26  GLUCOSE 136* 132*  BUN 15 14  CREATININE 0.58 0.55  CALCIUM 7.5* 7.7*   CMP     Component Value Date/Time   NA 143 07/09/2017 0412   K 3.6 07/09/2017 0412   CL 113 (H) 07/09/2017 0412   CO2 26 07/09/2017 0412   GLUCOSE 132 (H) 07/09/2017 0412   BUN 14 07/09/2017 0412   CREATININE 0.55 07/09/2017 0412   CALCIUM 7.7 (L) 07/09/2017 0412   PROT 5.8 (L) 06/30/2017 0439   ALBUMIN 1.4 (L) 06/30/2017 0439   AST 12 (L) 06/30/2017 0439   ALT 8 (L) 06/30/2017 0439   ALKPHOS 80 06/30/2017 0439   BILITOT 0.3 06/30/2017 0439   GFRNONAA >60 07/09/2017 0412   GFRAA >60 07/09/2017 0412   Lipase    Component Value Date/Time   LIPASE 31 05/14/2017 2106    Studies/Results: Dg Swallowing Func-speech Pathology  Result Date: 07/08/2017 Objective Swallowing Evaluation: Type of Study: MBS-Modified Barium Swallow Study Patient Details Name: Tiki Tucciarone MRN: [id number] Date of Birth: [birth year] Today's Date: 07/08/2017 Time: SLP Start Time (ACUTE ONLY): 1335-SLP Stop Time (ACUTE ONLY): 1355 SLP Time Calculation (min) (ACUTE ONLY): 20 min Past Medical History: Past Medical History: Diagnosis Date . Anemia 2017  3u blood 2017 . Arthritis   KNEES . 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 This is raised a concern of malignancy. We'll plan to move forward with laparoscopic possible open appendectomy to rule out malignancy.  . Cervical cancer, FIGO stage IIB ([place]) 05/24/2015 . 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 Depression  . Endometrial carcinoma [facility])   goes to [facility] in [place] for chemotherapy . GERD (gastroesophageal reflux disease)  . Heart murmur  . History of blood transfusion  . History of cerebral aneurysm repair   1997--  hemarrhagic aneurysm x2 --- s/p  left posterior fossa craniectomy . History  of radiation therapy 07/28/15-08/26/15  cervical region 27.5 gray . History of seizures   post-op craniotomy for aneurysm 1997-- controlled w/ medication since then no seizures . Hyperlipidemia  . Hypertension  . PMB (postmenopausal bleeding)  . Pulmonary nodule 03/26/2017 . Seizures ([place])   hx of years ago  . Type 2 diabetes, diet controlled ([place])   diet controlled  Past Surgical History: Past Surgical History: Procedure Laterality Date . COLONOSCOPY   . CRANIOTOMY POSTERIOR FOSSA  1997  Repair aneurysm  x2  left side .  LAPAROSCOPIC APPENDECTOMY N/A 04/19/2016  Procedure: APPENDECTOMY LAPAROSCOPIC;  Surgeon: Hall Busing, MD;  Location: [facility];  Service: General;  Laterality: N/A; . LAPAROSCOPY N/A 05/30/2017  Procedure: LAPAROSCOPY DIAGNOSTIC WITH PERITONEAL BIOPSY;  Surgeon: Jackolyn Confer, MD;  Location: [facility];  Service: General;  Laterality: N/A; . LAPAROTOMY  05/30/2017  Procedure: EXPLORATORY LAPAROTOMY REPAIR WITH DRAINAGE OF INTRA ABDOMINAL ABSCESS OF SMALL BOWEL PERFORATION WITH ENTEROCOLOSTOMY;  Surgeon: Jackolyn Confer, MD;  Location: [facility];  Service: General;; . PORT-A-CATH PLACEMENT  06/  2016 . TANDEM RING INSERTION N/A 07/28/2015  Procedure: TANDEM RING PLACEMENT;  Surgeon: Gery Pray, MD;  Location: [facility];  Service: Urology;  Laterality: N/A; . TANDEM RING INSERTION N/A 08/05/2015  Procedure: TANDEM RING PLACEMENT ;  Surgeon: Gery Pray, MD;  Location[facility];  Service: Urology;  Laterality: N/A; . TANDEM RING INSERTION N/A 08/11/2015  Procedure: TANDEM RING PLACEMENT ;  Surgeon: Gery Pray, MD;  Location: [facility];  Service: Urology;  Laterality: N/A; . TANDEM RING INSERTION N/A 08/17/2015  Procedure: TANDEM RING PLACEMENT ;  Surgeon: Gery Pray, MD;  Location: [facility];  Service: Urology;  Laterality: N/A; . 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 She was recently hospitalized for small bowel obstruction and underwent diagnostic laparoscopy in June. She underwent repair of small bowel perforation. She had a prolonged hospital stay.  Subsequently was discharged to skilled nursing facility. Presented back with rectal bleeding. Seen by oncology. Not thought to be a candidate for any treatment. Then she had what appeared to be a seizure resulting in encephalopathy. Neurology was consulted; pt with acute left thalamic CVA, bilateral small SDH. Initially evaluated by SLP 06/29/17 with recommendations for dys 2, thin liquids, however pt made NPO with cortrak placed per MD with change in MS, SLP s/o on 7/31 as pt remained unresponsive.  Subjective: "i just want some coffee" Assessment / Plan / Recommendation CHL IP CLINICAL IMPRESSIONS 07/08/2017 Clinical Impression Patient presents with moderate oral and mild pharyngeal phase dysphagia which appears cognitively and sensory-based. Oral stage of swallowing notable for intermittent oral holding, delayed oral transit, impaired mastication and weak lingual manipulation. Swallow initiation was delayed to level of the pyriform sinuses with thin liquids, resulting in deep penetration before the swallow with larger cup sips of thin liquids, and silent aspiration x1 with straw sip. Cued throat clear sufficient for airway clearance. Adequate tongue base retraction and pharyngeal constriction; noted sluggish hyolaryngeal excursion which intermittently slowed bolus passage through the UES. No abnormal residue remained in the pharynx after the swallow with any consistency trialed. Recommend initiating dys  2 diet with thin liquids, no straws, full supervision to monitor for small bites/sips. Cue pt for small, single sips of liquid, clear throat intermittently. SLP will monitor for tolerance, advancement of solids. Pt may benefit from removal of cortrak to facilitate comfort with PO intake.  SLP Visit Diagnosis Dysphagia, oropharyngeal phase (R13.12) Attention and concentration deficit following -- Frontal lobe and executive function deficit following -- Impact on safety and function Mild aspiration risk;Moderate  aspiration risk   CHL IP TREATMENT RECOMMENDATION 07/08/2017 Treatment Recommendations Therapy as outlined in treatment plan below   Prognosis 07/08/2017 Prognosis for Safe Diet Advancement Good Barriers to Reach Goals Cognitive deficits Barriers/Prognosis Comment -- CHL IP DIET RECOMMENDATION 07/08/2017 SLP Diet Recommendations Dysphagia 2 (Fine chop) solids;Thin liquid Liquid Administration via Cup;No straw Medication Administration Whole meds with puree Compensations Slow rate;Small sips/bites;Clear throat intermittently Postural Changes Seated upright at 90 degrees   CHL IP OTHER RECOMMENDATIONS 07/08/2017 Recommended Consults -- Oral Care Recommendations Oral care BID Other Recommendations --   CHL IP FOLLOW UP RECOMMENDATIONS 07/08/2017 Follow up Recommendations Skilled Nursing facility   [facility] IP FREQUENCY AND DURATION 07/08/2017 Speech Therapy Frequency (ACUTE ONLY) min 2x/week Treatment Duration 2 weeks      CHL IP ORAL PHASE 07/08/2017 Oral Phase Impaired Oral - Pudding Teaspoon -- Oral - Pudding Cup -- Oral - Honey Teaspoon -- Oral - Honey Cup -- Oral - Nectar Teaspoon Holding of bolus;Delayed oral transit Oral - Nectar Cup Holding of bolus;Delayed oral transit Oral - Nectar Straw Holding of bolus;Delayed oral transit Oral - Thin Teaspoon -- Oral - Thin Cup Holding of bolus;Delayed oral transit Oral - Thin Straw Holding of bolus;Delayed oral transit Oral - Puree Holding of bolus;Delayed oral transit;Reduced posterior propulsion Oral - Mech Soft -- Oral - Regular Impaired mastication;Delayed oral transit;Reduced posterior propulsion Oral - Multi-Consistency -- Oral - Pill Holding of bolus;Lingual pumping;Weak lingual manipulation;Delayed oral transit Oral Phase - Comment --  CHL IP PHARYNGEAL PHASE 07/08/2017 Pharyngeal Phase Impaired Pharyngeal- Pudding Teaspoon -- Pharyngeal -- Pharyngeal- Pudding Cup -- Pharyngeal -- Pharyngeal- Honey Teaspoon -- Pharyngeal -- Pharyngeal- Honey Cup -- Pharyngeal -- Pharyngeal- Nectar  Teaspoon -- Pharyngeal -- Pharyngeal- Nectar Cup Delayed swallow initiation-vallecula Pharyngeal -- Pharyngeal- Nectar Straw -- Pharyngeal -- Pharyngeal- Thin Teaspoon -- Pharyngeal -- Pharyngeal- Thin Cup Delayed swallow initiation-pyriform sinuses;Penetration/Aspiration before swallow Pharyngeal Material does not enter airway;Material enters airway, CONTACTS cords and not ejected out Pharyngeal- Thin Straw Delayed swallow initiation-pyriform sinuses;Penetration/Aspiration before swallow;Trace aspiration Pharyngeal Material enters airway, CONTACTS cords and not ejected out;Material enters airway, passes BELOW cords without attempt by patient to eject out (silent aspiration) Pharyngeal- Puree Delayed swallow initiation-vallecula Pharyngeal -- Pharyngeal- Mechanical Soft -- Pharyngeal -- Pharyngeal- Regular Delayed swallow initiation-vallecula Pharyngeal -- Pharyngeal- Multi-consistency -- Pharyngeal -- Pharyngeal- Pill -- Pharyngeal -- Pharyngeal Comment --  CHL IP CERVICAL ESOPHAGEAL PHASE 07/08/2017 Cervical Esophageal Phase WFL Pudding Teaspoon -- Pudding Cup -- Honey Teaspoon -- Honey Cup -- Nectar Teaspoon -- Nectar Cup -- Nectar Straw -- Thin Teaspoon -- Thin Cup -- Thin Straw -- Puree -- Mechanical Soft -- Regular -- Multi-consistency -- Pill -- Cervical Esophageal Comment -- No flowsheet data found. Aliene Altes 07/08/2017, 3:04 PM  Deneise Lever, [place], [place] Speech-Language Pathologist [phone]              Anti-infectives: Anti-infectives    Start     Dose/Rate Route Frequency Ordered Stop   07/04/17 1100  Ampicillin-Sulbactam (UNASYN) 3 g in sodium chloride 0.9 % 100 mL  IVPB     3 g 200 mL/hr over 30 Minutes Intravenous Every 6 hours 07/04/17 0823 07/10/17 1059   07/03/17 1600  Ampicillin-Sulbactam (UNASYN) 3 g in sodium chloride 0.9 % 100 mL IVPB  Status:  Discontinued     3 g 200 mL/hr over 30 Minutes Intravenous Every 6 hours 07/03/17 1047 07/04/17 0823   06/29/17 1400  ceFEPIme (MAXIPIME)  2 g in dextrose 5 % 50 mL IVPB  Status:  Discontinued     2 g 100 mL/hr over 30 Minutes Intravenous Every 8 hours 06/29/17 0853 06/29/17 0857   06/29/17 1000  ceFEPIme (MAXIPIME) 2 g in dextrose 5 % 50 mL IVPB  Status:  Discontinued     2 g 100 mL/hr over 30 Minutes Intravenous Every 8 hours 06/29/17 0857 07/03/17 1046   06/29/17 0930  metroNIDAZOLE (FLAGYL) IVPB 500 mg  Status:  Discontinued     500 mg 100 mL/hr over 60 Minutes Intravenous Every 8 hours 06/29/17 0838 06/30/17 0956   06/24/17 0600  piperacillin-tazobactam (ZOSYN) IVPB 3.375 g  Status:  Discontinued     3.375 g 12.5 mL/hr over 240 Minutes Intravenous Every 8 hours 06/23/17 2254 06/29/17 0838   06/23/17 2300  piperacillin-tazobactam (ZOSYN) IVPB 3.375 g     3.375 g 100 mL/hr over 30 Minutes Intravenous  Once 06/23/17 2254 06/24/17 0056       Assessment/Plan Diabetes insipidus Seizure disorder Lower GI bleed - resolved Anemia - Hg 7.4, stable from yesterday DM-II HTN Enterococcus UTI - on unasyn Acute metabolic encephalopathy - improving  Primary appendiceal carcinoma with metastatic disease, stage IV  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 [name]: 1. 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  2. Exploratory laparotomy, drainage of abdominal abscess, repair of small bowel perforation, repair of small bowel enterotomy, enterocolostomy (mid ileum to mid transverse colon). - having BM's, tolerating TF - TID wet to dry dressing changes with NS to midline abdominal wound - d/c Dakins today  FEN- DYS II VTE- SCDs, No anticoagulants due to anemia ID- Zosyn 7/21>>7/27, Flagyl 7/27>>7/28, Cefipime 7/27>>7/27, Unasyn 7/31>>  Plan:TID wet to dry dressing with NS to the abdominal wound.  LOS: 16 days    Brigid Re , [facility] 07/09/2017, 10:34 AM Pager:  [phone] Consults: [phone] Mon-Fri 7:00 am-4:30 pm Sat-Sun 7:00 am-11:30 am

## 2017-07-09 NOTE — Progress Notes (Addendum)
Nutrition Follow Up   DOCUMENTATION CODES:   Obesity unspecified  INTERVENTION:    Continue Jevity 1.2 formula at goal rate of 55 ml/hr with Prostat liquid protein 30 ml TID  Total TF regimen providing 1740 kcals, 118 gm protein, 1065 ml of free water  NUTRITION DIAGNOSIS:   Inadequate oral intake related to altered GI function as evidenced by NPO status, ongoing  GOAL:   Patient will meet greater than or equal to 90% of their needs, met  MONITOR:   TF tolerance, PO intake, Labs, Weight trends, I & O's  ASSESSMENT:   Lori [name] is a 66 y.o. female with medical history significant of DM2, Seizures (not on medication), HTN, HLD, GERD, cervical and appendiceal carcinoma, intracranial aneurysm repair in 1997 who presents for rectal bleeding.   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  Advanced to Dys 2-nectar thick liquid diet. No % PO intake records available. Jevity 1.2 formula infusing at goal rate of 55 ml/hr via Cortrak feeding tube. Tolerating well.  Surgery note reviewed. TID wet to dry dressing with NS to wound. Medications reviewed and include folic acid. Labs reviewed. CBG's [id number].  Diet Order:  DIET DYS 2 Room service appropriate? Yes; Fluid consistency: Nectar Thick  Skin:  Wound (see comment) (midline abdominal wound)  Last BM:  flexiseal  Height:   Ht Readings from Last 1 Encounters:  07/01/17 _0  (1.727 m)   Weight:   Wt Readings from Last 1 Encounters:  07/09/17 174 lb 6.1 oz (79.1 kg)   07/21  220 lb 07/29  179 lb 07/30  179 lb 08/02  198 lb 08/03  199 lb 08/04  173 lb 08/05  161 lb 08/06  174 lb  Ideal Body Weight:  65.9 kg  BMI:  Body mass index is 26.51 kg/m.  Estimated Nutritional Needs:   Kcal:  1800-2000  Protein:  105-120 grams  Fluid:  > 1.8 L  EDUCATION NEEDS:   Education needs no appropriate at this time  [name] Holms, RD, LDN Pager #: [phone] After-Hours Pager #:  [phone]

## 2017-07-09 NOTE — Care Management Important Message (Signed)
Important Message  Patient Details  Name: Lori [name] MRN: [id number] Date of Birth: 10-31-1951   Medicare Important Message Given:  Yes    [name] May 07/09/2017, 9:14 AM

## 2017-07-09 NOTE — Progress Notes (Signed)
  Speech Language Pathology Treatment: Dysphagia  Patient Details Name: Lori [name] MRN: [id number] DOB: 1951/07/14 Today's Date: 07/09/2017 Time: 0922-0940 SLP Time Calculation (min) (ACUTE ONLY): 18 min  Assessment / Plan / Recommendation Clinical Impression  Pt demonstrates consistent coughing after all sips of thin liquids. Pt led cup sips are overlarge with decreased labial seal, strongly suspect premature spillage/penetration/aspiration before the swallow. MBS showed some instances of silent aspiration. Pt is alert and participatory, but with overt right hemisphere disorder with decreased awareness and impaired attention. Given safety risks, recommend thickening liquids to nectar to reduce risk of aspiration. Also attempted trials of upgraded solid texture, pt was easily distracted from task and held cookie in her left cheek for a longer period. Would not recommend upgrade though pt is very dissatisfied with her food options. She does not like eggs or oatmeal and only wanted milk at am meal. Will follow for tolerance.    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 She was recently hospitalized for small bowel obstruction and underwent diagnostic laparoscopy in June. She underwent repair of small bowel perforation. She had a prolonged hospital stay. Subsequently was discharged to skilled nursing facility. Presented back with rectal bleeding. Seen by oncology. Not thought to be a candidate for any treatment. Then she had what appeared to be a seizure resulting in encephalopathy. Neurology was consulted; pt with acute left thalamic CVA, bilateral small SDH. Initially evaluated by SLP 06/29/17 with recommendations for dys 2, thin liquids, however pt made NPO with cortrak  placed per MD with change in MS, SLP s/o on 7/31 as pt remained unresponsive.       SLP Plan  Continue with current plan of care       Recommendations  Diet recommendations: Dysphagia 2 (fine chop);Nectar-thick liquid Liquids provided via: Cup;No straw Medication Administration: Via alternative means Supervision: Staff to assist with self feeding;Full supervision/cueing for compensatory strategies Compensations: Slow rate;Small sips/bites                Follow up Recommendations: Skilled Nursing facility SLP Visit Diagnosis: Dysphagia, oropharyngeal phase (R13.12) Plan: Continue with current plan of care       GO               [facility], MA CCC-SLP [phone]  Lynann Beaver 07/09/2017, 10:28 AM

## 2017-07-09 NOTE — Clinical Social Work Note (Signed)
CSW continuing to follow for discharge needs. Pt not medically stable for discharge at this time.   Oretha Ellis, [employer], [place] Work (Coverage) [phone]

## 2017-07-09 NOTE — Progress Notes (Signed)
[facility]                                                                              Patient Demographics  Lori [name], is a 66 y.o. female, DOB - 09-14-1951, YYT:[id number]  Admit date - 06/23/2017   Admitting Physician Sid Falcon, MD  Outpatient Primary MD for the patient is [name], MD  Outpatient specialists:   LOS - 16  days   Medical records reviewed and are as summarized below:    Chief Complaint  Patient presents with  . 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 She was at a skilled nursing facility. She was recently hospitalized for small bowel obstruction and underwent diagnostic laparoscopy in June. She underwent repair of small bowel perforation. She had a prolonged hospital stay. Subsequently was discharged to skilled nursing facility. Presented back with rectal bleeding. Seen by oncology. Not thought to be a candidate for any treatment. Then she had what appears to be a seizure resulting in encephalopathy. Neurology was consulted. Palliative medicine continues to follow. Family still desires aggressive care. Patient was transferred to stepdown unit. Critical care medicine was consulted. Patient noted to have profuse urine output. 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   Assessment & Plan    Acute metabolic encephalopathy, acute CVA left thalamic stroke, bilateral small SDH - Garbled speech, lethargy and facial drooping noted on 7/26, neurology consult was obtained as code stroke. CT head showed evolving encephalomalacia, suspected left thalamic lacunar infarct. Previously identified tiny bilateral subdural hematoma. - MRI of the brain 7/29 showed no new tiny bilateral subdural  hematomas, trace subarachnoid hemorrhage, punctate focus of diffusion abnormality in the dorsal thalamus. - Patient was placed on EEG on 8/1, did not show electrographic seizures however showed periodic lateralized discharges in the interictal pattern. Patient was given Keppra loading dose. - Continue IV Keppra and Tegretol - No need for LP as patient's mental status is clearly improving   Seizure disorder - Continue IV Keppra and Tegretol - Per neurology, no need for LP as patient is clearly improving  Diabetes insipidus with polyuria  - Patient had excessive urine output thought to be due to central diabetes insipidus due to abnormalities noted on the MRI brain. 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 Was on cefepime and Flagyl - Currently on Unasyn.  Nutrition - Much more alert and oriented, MBS today  - Currently has dobhoff with tube feeds  Essential hypertension - Currently stable, continue clonidine, hydralazine as  needed  Diabetes mellitus type 2 - CBGs controlled, continue sliding scale insulin  Enterococcus UTI - Patient was on Zosyn, transitioned to Unasyn  Code Status: full  DVT Prophylaxis:  SCD's Family Communication: Discussed in detail with the patient, all imaging results, lab results explained to the patient   Disposition Plan: Continue stepdown unit  Time Spent in minutes  25 minutes  Procedures:  EEG  Consultants:   Pulmonary critical care  General surgery  Neurology  Oncology  Palliative care   Antimicrobials:   Unasyn   Medications  Scheduled Meds: . carbamazepine  150 mg Oral TID  . cloNIDine  0.1 mg Transdermal Weekly  . feeding supplement (PRO-STAT SUGAR FREE 64)  30 mL Per Tube TID  . folic acid  1 mg Intravenous Daily  . insulin aspart  0-15 Units Subcutaneous Q4H  . metoprolol tartrate  5 mg Intravenous Q6H  . potassium chloride  40 mEq Oral Daily   Continuous Infusions: . ampicillin-sulbactam (UNASYN) IV Stopped (07/09/17 0442)  . dextrose 5 % with kcl 40 mL/hr at 07/09/17 0128  . feeding supplement (JEVITY 1.2 CAL) 1,000 mL (07/08/17 2045)  . levETIRAcetam Stopped (07/09/17 0136)   PRN Meds:.alum & mag hydroxide-simeth, hydrALAZINE, ondansetron **OR** ondansetron (ZOFRAN) IV, RESOURCE THICKENUP CLEAR, sodium chloride flush   Antibiotics   Anti-infectives    Start     Dose/Rate Route Frequency Ordered Stop   07/04/17 1100  Ampicillin-Sulbactam (UNASYN) 3 g in sodium chloride 0.9 % 100 mL IVPB     3 g 200 mL/hr over 30 Minutes Intravenous Every 6 hours 07/04/17 0823 07/10/17 1059   07/03/17 1600  Ampicillin-Sulbactam (UNASYN) 3 g in sodium chloride 0.9 % 100 mL IVPB  Status:  Discontinued     3 g 200 mL/hr over 30 Minutes Intravenous Every 6 hours 07/03/17 1047 07/04/17 0823   06/29/17 1400  ceFEPIme (MAXIPIME) 2 g in dextrose 5 % 50 mL IVPB  Status:  Discontinued     2 g 100 mL/hr over 30 Minutes Intravenous Every 8 hours 06/29/17 0853  06/29/17 0857   06/29/17 1000  ceFEPIme (MAXIPIME) 2 g in dextrose 5 % 50 mL IVPB  Status:  Discontinued     2 g 100 mL/hr over 30 Minutes Intravenous Every 8 hours 06/29/17 0857 07/03/17 1046   06/29/17 0930  metroNIDAZOLE (FLAGYL) IVPB 500 mg  Status:  Discontinued     500 mg 100 mL/hr over 60 Minutes Intravenous Every 8 hours 06/29/17 0838 06/30/17 0956   06/24/17 0600  piperacillin-tazobactam (ZOSYN) IVPB 3.375 g  Status:  Discontinued     3.375 g 12.5 mL/hr over 240 Minutes Intravenous Every 8 hours 06/23/17 2254 06/29/17 0838   06/23/17 2300  piperacillin-tazobactam (ZOSYN) IVPB 3.375 g     3.375 g 100 mL/hr over 30 Minutes Intravenous  Once 06/23/17 2254 06/24/17 0056        Subjective:   Vaneza Pickart was seen and examined today. Alert and oriented, asking for food. Patient denies dizziness, chest pain, shortness  of breath. No acute events overnight.    Objective:   Vitals:   07/08/17 2325 07/09/17 0406 07/09/17 0719 07/09/17 1140  BP: (!) 150/87 140/90  (!) 159/85  Pulse: 98 90 88 93  Resp: 16 18 11 16   Temp: 98.6 F (37 C) 97.6 F (36.4 C) 97.7 F (36.5 C)   TempSrc: Axillary Oral    SpO2: 100% 100% 100% 100%  Weight:  79.1 kg (174 lb 6.1 oz)    Height:        Intake/Output Summary (Last 24 hours) at 07/09/17 1155 Last data filed at 07/09/17 0859  Gross per 24 hour  Intake             3000 ml  Output             1100 ml  Net             1900 ml     Wt Readings from Last 3 Encounters:  07/09/17 79.1 kg (174 lb 6.1 oz)  06/01/17 101 kg (222 lb 10.6 oz)  05/17/17 95.5 kg (210 lb 8.6 oz)     Exam  General: Alert and oriented x 3, NAD, cortrack +  Eyes:  HEENT:  Cardiovascular: S1 S2 auscultated, no rubs, murmurs or gallops. Regular rate and rhythm. No pedal edema b/l Respiratory: Clear to auscultation bilaterally, no wheezing, rales or rhonchi Gastrointestinal: Soft, nontender, dressing intact Ext: no pedal edema bilaterally Neuro: alert and  oriented, LUE, LLE 2/5 , RUE, RLE 4/5  Musculoskeletal: No digital cyanosis, clubbing Skin: Psych: Normal affect and demeanor, alert and oriented x 3    Data Reviewed:  I have personally reviewed following labs and imaging studies  Micro Results No results found for this or any previous visit (from the past 240 hour(s)).  Radiology Reports Ct Abdomen Pelvis Wo Contrast  Result Date: 06/23/2017 CLINICAL DATA:  GI bleed. Melena. History of cervical cancer. History of appendiceal cancer. EXAM: CT ABDOMEN AND PELVIS WITHOUT CONTRAST TECHNIQUE: Multidetector CT imaging of the abdomen and pelvis was performed following the standard protocol without IV contrast. COMPARISON:  05/15/2017 FINDINGS: Lower chest: The heart is enlarged. Tiny pericardial effusion is similar to prior. There is dependent atelectasis in the lower lungs with moderate right and small left pleural effusions. Hepatobiliary: No focal abnormality in the liver on this study without intravenous contrast. Gallbladder is decompressed. The possible tiny stone in the gallbladder neck. No intrahepatic or extrahepatic biliary dilation. Pancreas: No focal mass lesion. No dilatation of the main duct. No intraparenchymal cyst. No peripancreatic edema. Spleen: No splenomegaly. No focal mass lesion. Adrenals/Urinary Tract: No adrenal nodule or mass. Left kidney is atrophic as before. There is new bilateral mild hydroureteronephrosis. The bladder is distended. Tiny air bubble in the bladder lumen may be related to recent instrumentation although bladder infection can cause this appearance. Stomach/Bowel: Stomach is mildly distended and fluid-filled. Duodenum unremarkable. No overt small bowel obstruction. The terminal ileum is normal. Suture line in the region of the mid transverse colon is compatible with the clinical history of enterocolostomy from the mid ileum to the mid transverse colon. Left colon unremarkable. There is edema a congestion of the  small bowel mesentery with small pockets of interloop mesenteric fluid. Vascular/Lymphatic: There is abdominal aortic atherosclerosis without aneurysm. There is no gastrohepatic or hepatoduodenal ligament lymphadenopathy. No intraperitoneal or retroperitoneal lymphadenopathy. No pelvic sidewall lymphadenopathy. Reproductive: Uterus appears enlarged. Endometrium is thickened in this patient with a history of cervical cancer. There is  no adnexal mass. Other: Focal fluid collection is identified in the right lower quadrant. This is difficult to assess without oral or intravenous contrast material, but it appears to have a circumferential rim and does not appear to represent bowel. There is another fluid collection inferior to the liver tracking down the right peritoneal cavity and into the right para colic gutter. This collection measures 8.8 cm in craniocaudal length by 7.4 cm and AP dimension by 2.5 cm coronally. This also has a rim of increased attenuation raising concern for abscess. Musculoskeletal: Open midline wound identified with packing material. Diffuse body wall edema evident. Stable appearance sclerotic lesion right acetabulum. Bone windows reveal no worrisome lytic or sclerotic osseous lesions. IMPRESSION: 1. Patient is approximately 3 weeks out from diagnostic laparoscopy with abscess drainage, repair of small bowel perforation, and entero colostomy. On today's exam, there are 2 apparent fluid collections identified, but are difficult to assess given the lack of intravenous and oral contrast. One of these is just inferior to the liver in tracks down the right paracolic gutter. The second is in the right lower quadrant mesentery. Given the history of recent surgery both could represent intraperitoneal abscess. However, with the history of appendiceal carcinoma, pseudomyxoma peritonei is also a consideration. 2. No evidence for bowel obstruction. No bowel wall thickening is evident. There is fairly diffuse  mesenteric edema and vascular congestion with scattered collections of interloop mesenteric fluid. The degree of these changes is more advanced than would be expected 3 weeks out from surgery. 3. Dependent atelectasis in both lower lungs with moderate right and small left pleural effusions. 4. Tiny air bubble in the urinary bladder may be from recent instrumentation, but bladder infection can have this appearance. 5. Thickening of the endometrial stripe in this patient with history of cervical cancer. Electronically Signed   By: Misty Stanley M.D.   On: 06/23/2017 20:32   Ct Head Wo Contrast  Result Date: 07/04/2017 CLINICAL DATA:  Follow-up CVA EXAM: CT HEAD WITHOUT CONTRAST TECHNIQUE: Contiguous axial images were obtained from the base of the skull through the vertex without intravenous contrast. COMPARISON:  06/28/2017; brain MRI - 07/01/2017; 06/28/2017 FINDINGS: Brain: Re- demonstrated atrophy with sulcal prominence. Old right frontal lobe infarct with associated dystrophic calcifications. Old left cerebellar hemisphere infarct / postoperative change. Periventricular hypodensities compatible microvascular ischemic disease. Suspected punctate evolving lacunar infarct at previously suspected tiny left thalamic infarct (image 14, series 3). Given extensive background parenchymal abnormalities, there is no CT evidence superimposed acute large territory infarct. No intraparenchymal or extra-axial mass or hemorrhage as previously noted tiny bilateral subdural hematomas are not well demonstrated on the present examination. Vascular: Intracranial atherosclerosis. Skull: Stable sequela of left suboccipital craniotomy. Old right frontal lobe burr hole. No displaced calvarial fracture. Sinuses/Orbits: Scattered opacification of the bilateral frontal sinuses as well as the left anterior ethmoidal air cells, unchanged. Unchanged punctate (approximately 0.5 cm) osteoid osteoma within the left anterior ethmoidal air cell  (image 15, series 4). Remaining paranasal sinuses and mastoid air cells are normally aerated. No air-fluid levels. Other: Regional soft tissues appear normal. IMPRESSION: 1. Suspected evolving encephalomalacia at suspected left thalamic lacunar infarct. No superimposed acute large territory infarct. 2. Previously identified tiny bilateral subdural hematoma as not definitely seen on the present examination. No new or enlarging intraparenchymal or extra-axial hemorrhage. 3. Stable sequela of prior right frontal lobe infarct. 4. Stable sequela of prior left occipital lobe infarct/postsurgical change. Electronically Signed   By: Eldridge Abrahams.D.  On: 07/04/2017 10:01   Mr Brain Wo Contrast  Result Date: 06/28/2017 CLINICAL DATA:  66 y/o F; patient presenting with increased lethargy, garbled speech, and facial droop. History seizures, hemorrhagic aneurysm, cervical and metastatic appendiceal carcinoma, and recent abdominal surgery. EXAM: MRI HEAD WITHOUT CONTRAST TECHNIQUE: Multiplanar, multiecho pulse sequences of the brain and surrounding structures were obtained without intravenous contrast. COMPARISON:  06/28/2017 CT of the head. FINDINGS: Brain: No diffusion signal abnormality to suggest acute or early subacute infarction. Stable left cerebellar hemisphere encephalomalacia. Stable right frontal lobe encephalomalacia and with central mineralization. Moderate diffuse brain parenchymal volume loss. Scattered foci of T2 FLAIR hyperintense signal abnormality in periventricular white matter are nonspecific but compatible with chronic microvascular ischemic changes. There is diffusely increased susceptibility hypointensity throughout the vascular structures of the brain. Vascular: Normal flow voids. Skull and upper cervical spine: Normal marrow signal. Postsurgical changes related to a left suboccipital craniectomy. Sinuses/Orbits: Partial opacification of the left-greater-than-right frontal sinuses and left anterior  ethmoid air cells. Other: None. IMPRESSION: 1. No acute intracranial abnormality identified. 2. Left cerebellar hemisphere encephalomalacia and right frontal lobe encephalomalacia with central mineralization. 3. Diffusely increased susceptibility hypointensity throughout vascular structures in the brain, question recent iron replacement therapy. A component of siderosis may also be present. 4. Partial opacification of left-greater-than-right frontal sinuses and left anterior ethmoid air cells. 5. Mild chronic microvascular ischemic changes and moderate parenchymal volume loss of the brain. Electronically Signed   By: Kristine Garbe M.D.   On: 06/28/2017 22:25   Mr Jeri Cos IF Contrast  Result Date: 07/01/2017 CLINICAL DATA:  Encephalopathy. History of seizure disorder and metastatic appendiceal cancer. EXAM: MRI HEAD WITHOUT AND WITH CONTRAST TECHNIQUE: Multiplanar, multiecho pulse sequences of the brain and surrounding structures were obtained without and with intravenous contrast. CONTRAST:  20 mL MultiHance COMPARISON:  06/28/2017 noncontrast brain MRI FINDINGS: The study is severely motion degraded. Brain: There is a 2 mm focus of mildly increased trace diffusion signal and apparently reduced ADC in the dorsal thalamus on axial diffusion imaging, not confirmed on coronal images although assessment is limited by slice selection. No acute infarct is identified elsewhere. Chronic right frontal lobe encephalomalacia is again noted with central calcification. There is also chronic left cerebellar encephalomalacia. Scattered T2 hyperintensities elsewhere in the cerebral white matter bilaterally are nonspecific but compatible with mild chronic small vessel ischemic disease. There is new abnormal T1 and FLAIR hyperintensity in the extra-axial space overlying the posterior cerebral convexities bilaterally most compatible with thin subdural hematomas measuring 2 mm in thickness each. There is no associated  mass effect. A small amount of abnormal sulcal FLAIR hyperintensity at the vertex on the right (series 8, image 24) is favored to reflect artifactual incomplete FLAIR suppression due to regional susceptibility without evidence of subarachnoid hemorrhage elsewhere. There is moderate cerebral atrophy. No abnormal enhancement is identified to suggest metastatic disease. Vascular: Major intracranial vascular flow voids are preserved. Skull and upper cervical spine: Diffusely diminished bone marrow signal intensity likely related to patient's anemia. Sinuses/Orbits: Unremarkable orbits. Similar appearance of partial left frontal sinus opacification. No significant scratched a trace bilateral mastoid fluid. Other: None. IMPRESSION: 1. Severely motion degraded examination. 2. New tiny bilateral subdural hematomas over both posterior cerebral convexities. No mass effect. 3. Minimal sulcal signal abnormality at the right vertex favored to reflect artifact rather than trace subarachnoid hemorrhage. 4. Punctate focus of diffusion abnormality in the dorsal thalamus, favor artifact over tiny infarct. 5. 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  6. No evidence of metastatic disease within limitations of motion. Critical Value/emergent results were called by telephone at the time of interpretation on 07/01/2017 at 2:01 pm to Dr. [place] Pink, who verbally acknowledged these results. Electronically Signed   By: Logan Bores M.D.   On: 07/01/2017 14:05   Dg Swallowing Func-speech Pathology  Result Date: 07/08/2017 Objective Swallowing Evaluation: Type of Study: MBS-Modified Barium Swallow Study Patient Details Name: Lesa Vandall MRN: [id number] Date of Birth: 03/02/1951 Today's Date: 07/08/2017 Time: SLP Start Time (ACUTE ONLY): 1335-SLP Stop Time (ACUTE ONLY): 1355 SLP Time Calculation (min) (ACUTE ONLY): 20 min Past Medical History: Past Medical History: Diagnosis Date . Anemia 2017  3u blood 2017 . Arthritis    KNEES . Carcinoma of appendix ([place]) 04/11/2016  Patient with a history of cervical cancer status post chemoradiation with a persistent finding of a dilated appendix with hypermetabolism. This is raised a concern of malignancy. We'll plan to move forward with laparoscopic possible open appendectomy to rule out malignancy.  . Cervical cancer, FIGO stage IIB ([place]) 05/24/2015 . 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 Depression  . Endometrial carcinoma [facility])   goes to [facility] in [place] for chemotherapy . GERD (gastroesophageal reflux disease)  . Heart murmur  . History of blood transfusion  . History of cerebral aneurysm repair   1997--  hemarrhagic aneurysm x2 --- s/p  left posterior fossa craniectomy . History of radiation therapy 07/28/15-08/26/15  cervical region 27.5 gray . History of seizures   post-op craniotomy for aneurysm 1997-- controlled w/ medication since then no seizures . Hyperlipidemia  . Hypertension  . PMB (postmenopausal bleeding)  . Pulmonary nodule 03/26/2017 . Seizures ([place])   hx of years ago  . Type 2 diabetes, diet controlled ([place])   diet controlled  Past Surgical History: Past Surgical History: Procedure Laterality Date . COLONOSCOPY   . CRANIOTOMY POSTERIOR FOSSA  1997  Repair aneurysm  x2  left side . LAPAROSCOPIC APPENDECTOMY N/A 04/19/2016  Procedure: APPENDECTOMY LAPAROSCOPIC;  Surgeon: Hall Busing, MD;  Location: [facility];  Service: General;  Laterality: N/A; . LAPAROSCOPY N/A 05/30/2017  Procedure: LAPAROSCOPY DIAGNOSTIC WITH PERITONEAL BIOPSY;  Surgeon: Jackolyn Confer, MD;  Location: [facility];  Service: General;  Laterality: N/A; . LAPAROTOMY  05/30/2017  Procedure: EXPLORATORY LAPAROTOMY REPAIR WITH DRAINAGE OF INTRA ABDOMINAL ABSCESS OF SMALL BOWEL PERFORATION WITH ENTEROCOLOSTOMY;  Surgeon: Jackolyn Confer, MD;  Location: [facility];  Service:  General;; . PORT-A-CATH PLACEMENT  06/  2016 . TANDEM RING INSERTION N/A 07/28/2015  Procedure: TANDEM RING PLACEMENT;  Surgeon: Gery Pray, MD;  [facility];  Service: Urology;  Laterality: N/A; . TANDEM RING INSERTION N/A 08/05/2015  Procedure: TANDEM RING PLACEMENT ;  Surgeon: Gery Pray, MD;  Location: [facility];  Service: Urology;  Laterality: N/A; . TANDEM RING INSERTION N/A 08/11/2015  Procedure: TANDEM RING PLACEMENT ;  Surgeon: Gery Pray, MD;  Location: [facility];  Service: Urology;  Laterality: N/A; . TANDEM RING INSERTION N/A 08/17/2015  Procedure: TANDEM RING PLACEMENT ;  Surgeon: Gery Pray, MD;  Location: [facility];  Service: Urology;  Laterality: N/A; . 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 She was recently hospitalized for small bowel obstruction and underwent diagnostic laparoscopy in June. She underwent repair of small bowel perforation. She had a prolonged hospital stay. Subsequently was discharged to skilled nursing facility. Presented back with rectal bleeding. Seen by oncology. Not thought to be a candidate for any treatment. Then she had what appeared to be a seizure resulting in encephalopathy. Neurology was consulted; pt with acute left thalamic CVA, bilateral small SDH. Initially evaluated by SLP 06/29/17 with recommendations for dys 2, thin liquids, however pt made NPO with cortrak placed per MD with change in MS, SLP s/o on 7/31 as pt remained unresponsive.  Subjective: "i just want some coffee" Assessment /  Plan / Recommendation CHL IP CLINICAL IMPRESSIONS 07/08/2017 Clinical Impression Patient presents with moderate oral and mild pharyngeal phase dysphagia which appears cognitively and sensory-based. Oral stage of swallowing notable for intermittent oral holding, delayed oral transit, impaired mastication and weak lingual manipulation. Swallow initiation was delayed to level of the pyriform sinuses with thin liquids, resulting in deep penetration before the swallow with larger cup sips of thin liquids, and silent aspiration x1 with straw sip. Cued throat clear sufficient for airway clearance. Adequate tongue base retraction and pharyngeal constriction; noted sluggish hyolaryngeal excursion which intermittently slowed bolus passage through the UES. No abnormal residue remained in the pharynx after the swallow with any consistency trialed. Recommend initiating dys 2 diet with thin liquids, no straws, full supervision to monitor for small bites/sips. Cue pt for small, single sips of liquid, clear throat intermittently. SLP will monitor for tolerance, advancement of solids. Pt may benefit from removal of cortrak to facilitate comfort with PO intake.  SLP Visit Diagnosis Dysphagia, oropharyngeal phase (R13.12) Attention and concentration deficit following -- Frontal lobe and executive function deficit following -- Impact on safety and function Mild aspiration risk;Moderate aspiration risk   [facility] IP TREATMENT RECOMMENDATION 07/08/2017 Treatment Recommendations Therapy as outlined in treatment plan below   Prognosis 07/08/2017 Prognosis for Safe Diet Advancement Good Barriers to Reach Goals Cognitive deficits Barriers/Prognosis Comment -- [facility] IP DIET RECOMMENDATION 07/08/2017 SLP Diet Recommendations Dysphagia 2 (Fine chop) solids;Thin liquid Liquid Administration via Cup;No straw Medication Administration Whole meds with puree Compensations Slow rate;Small sips/bites;Clear throat intermittently Postural Changes Seated upright at 90  degrees   [facility] IP OTHER RECOMMENDATIONS 07/08/2017 Recommended Consults -- Oral Care Recommendations Oral care BID Other Recommendations --   [facility] IP FOLLOW UP RECOMMENDATIONS 07/08/2017 Follow up Recommendations Skilled Nursing facility   [facility] IP FREQUENCY AND DURATION 07/08/2017 Speech Therapy Frequency (ACUTE ONLY) min 2x/week Treatment Duration 2 weeks      [facility] IP ORAL PHASE 07/08/2017 Oral Phase Impaired Oral - Pudding Teaspoon -- Oral - Pudding Cup -- Oral - Honey Teaspoon -- Oral - Honey Cup -- Oral - Nectar Teaspoon Holding of bolus;Delayed oral transit Oral - Nectar Cup Holding of bolus;Delayed oral transit Oral - Nectar Straw Holding of bolus;Delayed oral transit Oral - Thin Teaspoon -- Oral - Thin Cup Holding of bolus;Delayed oral transit Oral - Thin Straw Holding of bolus;Delayed oral transit Oral - Puree Holding of bolus;Delayed oral transit;Reduced posterior propulsion Oral - Mech Soft -- Oral - Regular Impaired mastication;Delayed oral transit;Reduced posterior propulsion Oral - Multi-Consistency -- Oral - Pill Holding of bolus;Lingual pumping;Weak lingual manipulation;Delayed oral transit Oral Phase - Comment --  [facility] IP  PHARYNGEAL PHASE 07/08/2017 Pharyngeal Phase Impaired Pharyngeal- Pudding Teaspoon -- Pharyngeal -- Pharyngeal- Pudding Cup -- Pharyngeal -- Pharyngeal- Honey Teaspoon -- Pharyngeal -- Pharyngeal- Honey Cup -- Pharyngeal -- Pharyngeal- Nectar Teaspoon -- Pharyngeal -- Pharyngeal- Nectar Cup Delayed swallow initiation-vallecula Pharyngeal -- Pharyngeal- Nectar Straw -- Pharyngeal -- Pharyngeal- Thin Teaspoon -- Pharyngeal -- Pharyngeal- Thin Cup Delayed swallow initiation-pyriform sinuses;Penetration/Aspiration before swallow Pharyngeal Material does not enter airway;Material enters airway, CONTACTS cords and not ejected out Pharyngeal- Thin Straw Delayed swallow initiation-pyriform sinuses;Penetration/Aspiration before swallow;Trace aspiration Pharyngeal Material enters airway, CONTACTS cords and  not ejected out;Material enters airway, passes BELOW cords without attempt by patient to eject out (silent aspiration) Pharyngeal- Puree Delayed swallow initiation-vallecula Pharyngeal -- Pharyngeal- Mechanical Soft -- Pharyngeal -- Pharyngeal- Regular Delayed swallow initiation-vallecula Pharyngeal -- Pharyngeal- Multi-consistency -- Pharyngeal -- Pharyngeal- Pill -- Pharyngeal -- Pharyngeal Comment --  [facility] IP CERVICAL ESOPHAGEAL PHASE 07/08/2017 Cervical Esophageal Phase WFL Pudding Teaspoon -- Pudding Cup -- Honey Teaspoon -- Honey Cup -- Nectar Teaspoon -- Nectar Cup -- Nectar Straw -- Thin Teaspoon -- Thin Cup -- Thin Straw -- Puree -- Mechanical Soft -- Regular -- Multi-consistency -- Pill -- Cervical Esophageal Comment -- No flowsheet data found. Aliene Altes 07/08/2017, 3:04 PM  Deneise Lever, MS, CCC-SLP Speech-Language Pathologist [phone]             Ct Head Code Stroke Wo Contrast  Result Date: 06/28/2017 CLINICAL DATA:  Code stroke.  Left facial droop. EXAM: CT HEAD WITHOUT CONTRAST TECHNIQUE: Contiguous axial images were obtained from the base of the skull through the vertex without intravenous contrast. COMPARISON:  06/17/2017 FINDINGS: Brain: A moderate-sized region of encephalomalacia with associated calcification in the high right frontal lobe is unchanged. Left cerebellar encephalomalacia underlying a craniectomy defect is also unchanged. There is no evidence of acute infarct, mass, midline shift, or extra-axial fluid collection. Cerebral atrophy is unchanged. Patchy bilateral cerebral white matter hypodensities are similar to the prior study and nonspecific but compatible with moderate chronic small vessel ischemic disease. Vascular: Calcified atherosclerosis at the skullbase. No hyperdense vessel. Skull: Left suboccipital craniectomy. Right frontal burr hole. No acute fracture or suspicious osseous lesion. Sinuses/Orbits: Slightly progressive left frontal sinus mucosal thickening. Clear  mastoid air cells. Unremarkable orbits. Other: None. ASPECTS [facility] Stroke Program Early CT Score) Not scored due to chronic right frontal encephalomalacia changes. IMPRESSION: 1. No evidence of acute intracranial abnormality. 2. 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 Results sent via text page to Dr. Rory Percy on 06/28/2017 at 2:54 p.m. Electronically Signed   By: Logan Bores M.D.   On: 06/28/2017 14:54    Lab Data:  CBC:  Recent Labs Lab 07/05/17 0440 07/06/17 0500 07/07/17 0457 07/08/17 1158 07/09/17 0412  WBC 10.7* 10.9* 10.0 10.3 10.0  HGB 8.0* 7.6* 7.4* 7.4* 7.4*  HCT 26.3* 24.8* 24.6* 24.7* 25.2*  MCV 88.9 89.5 91.8 92.5 92.0  PLT 353 311 258 258 761   Basic Metabolic Panel:  Recent Labs Lab 07/03/17 0909  07/04/17 0510 07/05/17 0440 07/06/17 0500 07/07/17 0457 07/08/17 1158 07/09/17 0412  NA  --   < > 135 137 140 141 146* 143  K  --   < > 3.0* 3.5 3.1* 3.3* 4.0 3.6  CL  --   < > 99* 102 107 109 114* 113*  CO2  --   < > 31 30 30 27 27 26   GLUCOSE  --   < > 141* 123* 116* 107* 136* 132*  BUN  --   < >  5* 11 11 12 15 14   CREATININE  --   < > 0.73 0.78 0.70 0.58 0.58 0.55  CALCIUM  --   < > 7.3* 7.1* 7.2* 7.4* 7.5* 7.7*  MG 1.0*  --  1.3*  --   --  1.4*  --   --   < > = values in this interval not displayed. GFR: Estimated Creatinine Clearance: 76.4 mL/min (by C-G formula based on SCr of 0.55 mg/dL). Liver Function Tests: No results for input(s): AST, ALT, ALKPHOS, BILITOT, PROT, ALBUMIN in the last 168 hours. No results for input(s): LIPASE, AMYLASE in the last 168 hours.  Recent Labs Lab 07/02/17 1627  AMMONIA 30   Coagulation Profile:  Recent Labs Lab 07/05/17 1330  INR 1.62   Cardiac Enzymes: No results for input(s): CKTOTAL, CKMB, CKMBINDEX, TROPONINI in the last 168 hours. BNP (last 3 results) No results for input(s): PROBNP in the last 8760 hours. HbA1C: No results for input(s): HGBA1C in the last 72 hours. CBG:  Recent  Labs Lab 07/08/17 2016 07/08/17 2324 07/09/17 0344 07/09/17 0721 07/09/17 1141  GLUCAP 116* 111* 133* 120* 119*   Lipid Profile: No results for input(s): CHOL, HDL, LDLCALC, TRIG, CHOLHDL, LDLDIRECT in the last 72 hours. Thyroid Function Tests: No results for input(s): TSH, T4TOTAL, FREET4, T3FREE, THYROIDAB in the last 72 hours. Anemia Panel: No results for input(s): VITAMINB12, FOLATE, FERRITIN, TIBC, IRON, RETICCTPCT in the last 72 hours. Urine analysis:    Component Value Date/Time   COLORURINE STRAW (A) 07/02/2017 1704   APPEARANCEUR CLEAR 07/02/2017 1704   LABSPEC 1.005 07/02/2017 1704   LABSPEC 1.010 08/17/2015 1542   PHURINE 8.0 07/02/2017 1704   GLUCOSEU NEGATIVE 07/02/2017 1704   GLUCOSEU 100 08/17/2015 1542   HGBUR SMALL (A) 07/02/2017 1704   BILIRUBINUR NEGATIVE 07/02/2017 1704   BILIRUBINUR Negative 08/17/2015 [place] 07/02/2017 1704   PROTEINUR NEGATIVE 07/02/2017 1704   UROBILINOGEN 1.0 08/26/2015 0725   UROBILINOGEN 0.2 08/17/2015 1542   NITRITE NEGATIVE 07/02/2017 1704   LEUKOCYTESUR NEGATIVE 07/02/2017 1704   LEUKOCYTESUR Trace 08/17/2015 1542     Tynell Winchell M.D. [facility] 07/09/2017, 11:55 AM  Pager: [phone] Between 7am to 7pm - call Pager - [phone]  After 7pm go to [email or web address] - password [email or web address]  Call night coverage person covering after 7pm

## 2017-07-10 LAB — CBC
HCT: 24.2 % — ABNORMAL LOW (ref 36.0–46.0)
Hemoglobin: 7.3 g/dL — ABNORMAL LOW (ref 12.0–15.0)
MCH: 27.9 pg (ref 26.0–34.0)
MCHC: 30.2 g/dL (ref 30.0–36.0)
MCV: 92.4 fL (ref 78.0–100.0)
PLATELETS: 259 10*3/uL (ref 150–400)
RBC: 2.62 MIL/uL — ABNORMAL LOW (ref 3.87–5.11)
RDW: 18.8 % — AB (ref 11.5–15.5)
WBC: 9.1 10*3/uL (ref 4.0–10.5)

## 2017-07-10 LAB — GLUCOSE, CAPILLARY
GLUCOSE-CAPILLARY: 113 mg/dL — AB (ref 65–99)
GLUCOSE-CAPILLARY: 114 mg/dL — AB (ref 65–99)
GLUCOSE-CAPILLARY: 125 mg/dL — AB (ref 65–99)
GLUCOSE-CAPILLARY: 144 mg/dL — AB (ref 65–99)
Glucose-Capillary: 138 mg/dL — ABNORMAL HIGH (ref 65–99)
Glucose-Capillary: 106 mg/dL — ABNORMAL HIGH (ref 65–99)

## 2017-07-10 LAB — BASIC METABOLIC PANEL
Anion gap: 4 — ABNORMAL LOW (ref 5–15)
BUN: 12 mg/dL (ref 6–20)
CALCIUM: 7.7 mg/dL — AB (ref 8.9–10.3)
CO2: 27 mmol/L (ref 22–32)
CREATININE: 0.55 mg/dL (ref 0.44–1.00)
Chloride: 111 mmol/L (ref 101–111)
GFR calc Af Amer: >60 mL/min (ref >60)
GFR calc non Af Amer: >60 mL/min (ref >60)
GLUCOSE: 151 mg/dL — AB (ref 65–99)
Potassium: 3.7 mmol/L (ref 3.5–5.1)
Sodium: 142 mmol/L (ref 135–145)

## 2017-07-10 MED ORDER — LEVETIRACETAM 500 MG PO TABS
1000.0000 mg | ORAL_TABLET | Freq: Two times a day (BID) | ORAL | Status: DC
  Administered 2017-07-10 – 2017-07-21 (×22): 1000 mg via ORAL
  Filled 2017-07-10 (×23): qty 2

## 2017-07-10 MED ORDER — FOLIC ACID 1 MG PO TABS
1.0000 mg | ORAL_TABLET | Freq: Every day | ORAL | Status: DC
  Administered 2017-07-11 – 2017-08-01 (×21): 1 mg
  Filled 2017-07-10 (×21): qty 1

## 2017-07-10 NOTE — Progress Notes (Signed)
[facility]                                                                              Patient Demographics  Lori [name], is a 66 y.o. female, DOB - 03-06-1951, RXV:[id number]  Admit date - 06/23/2017   Admitting Physician Sid Falcon, MD  Outpatient Primary MD for the patient is [name], MD  Outpatient specialists:   LOS - 17  days   Medical records reviewed and are as summarized below:    Chief Complaint  Patient presents with  . 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 She was at a skilled nursing facility. She was recently hospitalized for small bowel obstruction and underwent diagnostic laparoscopy in June. She underwent repair of small bowel perforation. She had a prolonged hospital stay. Subsequently was discharged to skilled nursing facility. Presented back with rectal bleeding. Seen by oncology. Not thought to be a candidate for any treatment. Then she had what appears to be a seizure resulting in encephalopathy. Neurology was consulted. Palliative medicine continues to follow. Family still desires aggressive care. Patient was transferred to stepdown unit. Critical care medicine was consulted. Patient noted to have profuse urine output. Concern was for diabetes insipidus..  Interim summary Patient has been progressively improving in the stepdown unit. She passed the MBS today, started on dysphagia 2 diet. Transitioned to oral Keppra, continue Tegretol. Mental status has been progressively improving every day. Per neuro, no need of LP if mental status improving. Will transfer to floor, start physical therapy and work on disposition to rehabilitation   Assessment & Plan    Acute  metabolic encephalopathy, acute CVA left thalamic stroke, bilateral small SDH - Garbled speech, lethargy and facial drooping noted on 7/26, neurology consult was obtained as code stroke. CT head showed evolving encephalomalacia, suspected left thalamic lacunar infarct. Previously identified tiny bilateral subdural hematoma. - MRI of the brain 7/29 showed no new tiny bilateral subdural hematomas, trace subarachnoid hemorrhage, punctate focus of diffusion abnormality in the dorsal thalamus. - Patient was placed on EEG on 8/1, did not show electrographic seizures however showed periodic lateralized discharges in the interictal pattern. Patient was given Keppra loading dose. - Continue IV Keppra and Tegretol-> transitioned to oral Keppra, placed on dysphagia 2 diet today - No need for LP as patient's mental status is clearly improving - Patient passed MBS today, placed on dysphagia 2 diet   Seizure disorder - Continue Tegretol, transitioned to oral Keppra today  - Per neurology, no need for LP as patient is clearly improving  Diabetes insipidus with polyuria  - Patient had excessive urine output thought to be due to central diabetes insipidus due to abnormalities noted on the MRI brain. 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 Was on cefepime and Flagyl - Currently on Unasyn.  Nutrition - Alert and oriented, passed MBS, started on dysphagia 2 diet  Essential hypertension - Currently stable, continue clonidine, hydralazine as needed  Diabetes mellitus type 2 - CBGs controlled, continue sliding scale insulin  Enterococcus UTI - Patient was on Zosyn, transitioned to Unasyn  Code Status: full  DVT Prophylaxis:  SCD's Family Communication: Discussed in detail with the patient, all imaging results, lab results explained to the patient   Disposition Plan:  Transfer to the floor today, start PT evaluation for rehabilitation    Time Spent in minutes  25 minutes  Procedures:  EEG  Consultants:   Pulmonary critical care  General surgery  Neurology  Oncology  Palliative care   Antimicrobials:   Unasyn   Medications  Scheduled Meds: . carbamazepine  150 mg Oral TID  . cloNIDine  0.1 mg Transdermal Weekly  . feeding supplement (PRO-STAT SUGAR FREE 64)  30 mL Per Tube TID  . folic acid  1 mg Per Tube Daily  . insulin aspart  0-15 Units Subcutaneous Q4H  . metoprolol tartrate  5 mg Intravenous Q6H  . potassium chloride  40 mEq Oral Daily   Continuous Infusions: . dextrose 5 % with kcl 40 mL/hr at 07/10/17 0359  . feeding supplement (JEVITY 1.2 CAL) 1,000 mL (07/10/17 1316)  . levETIRAcetam 1,000 mg (07/10/17 1322)   PRN Meds:.alum & mag hydroxide-simeth, hydrALAZINE, ondansetron **OR** ondansetron (ZOFRAN) IV, RESOURCE THICKENUP CLEAR, sodium chloride flush   Antibiotics   Anti-infectives    Start     Dose/Rate Route Frequency Ordered Stop   07/04/17 1100  Ampicillin-Sulbactam (UNASYN) 3 g in sodium chloride  0.9 % 100 mL IVPB     3 g 200 mL/hr over 30 Minutes Intravenous Every 6 hours 07/04/17 0823 07/10/17 0429   07/03/17 1600  Ampicillin-Sulbactam (UNASYN) 3 g in sodium chloride 0.9 % 100 mL IVPB  Status:  Discontinued     3 g 200 mL/hr over 30 Minutes Intravenous Every 6 hours 07/03/17 1047 07/04/17 0823   06/29/17 1400  ceFEPIme (MAXIPIME) 2 g in dextrose 5 % 50 mL IVPB  Status:  Discontinued     2 g 100 mL/hr over 30 Minutes Intravenous Every 8 hours 06/29/17 0853 06/29/17 0857   06/29/17 1000  ceFEPIme (MAXIPIME) 2 g in dextrose 5 % 50 mL IVPB  Status:  Discontinued     2 g 100 mL/hr over 30 Minutes Intravenous Every 8 hours 06/29/17 0857 07/03/17 1046   06/29/17 0930  metroNIDAZOLE (FLAGYL) IVPB 500 mg  Status:  Discontinued     500 mg 100 mL/hr over 60 Minutes Intravenous Every 8 hours 06/29/17 0838 06/30/17 0956   06/24/17 0600  piperacillin-tazobactam (ZOSYN) IVPB 3.375 g  Status:  Discontinued     3.375 g 12.5 mL/hr over 240 Minutes Intravenous Every 8 hours 06/23/17 2254 06/29/17 0838   06/23/17 2300  piperacillin-tazobactam (ZOSYN) IVPB 3.375 g     3.375 g 100 mL/hr over 30 Minutes Intravenous  Once 06/23/17 2254 06/24/17 0056        Subjective:   Samantha Ragen was seen and examined today. Alert and oriented, wanting coffee today. No new complaints, slept well. Patient denied dizziness, chest pain, shortness of breath. No acute events overnight.    Objective:   Vitals:   07/10/17 0900 07/10/17 1000 07/10/17 1100 07/10/17 1154  BP: (!) 161/93   (!) 157/85  Pulse: 93 88 94 96  Resp: 13 16 17 20   Temp:    99.6 F (37.6 C)  TempSrc:    Oral  SpO2: 96% 98% 98% 100%  Weight:      Height:        Intake/Output Summary (Last 24 hours) at 07/10/17 1324 Last data filed at 07/10/17 1155  Gross per 24 hour  Intake             3025 ml  Output             2400 ml  Net              625 ml     Wt Readings from Last 3 Encounters:  07/10/17 92.8 kg (204 lb 9.4 oz)    06/01/17 101 kg (222 lb 10.6 oz)  05/17/17 95.5 kg (210 lb 8.6 oz)     Exam  General: Alert and oriented x 3, NAD, cortrak+ Eyes:  HEENT:  Atraumatic, normocephalic Cardiovascular: S1 S2 auscultated, no rubs, murmurs or gallops. Regular rate and rhythm. No pedal edema b/l Respiratory: Clear to auscultation bilaterally, no wheezing, rales or rhonchi Gastrointestinal: Soft, nontender, nondistended, + bowel sounds, abdominal dressing intact Ext: no pedal edema bilaterally Neuro: AAOx3, LUE, LLE 2/5, RUE, RLE 4/5  Musculoskeletal: No digital cyanosis, clubbing Skin: Abdominal dressing intact Psych: Normal affect and demeanor, alert and oriented x3     Data Reviewed:  I have personally reviewed following labs and imaging studies  Micro Results No results found for this or any previous visit (from the past 240 hour(s)).  Radiology Reports Ct Abdomen Pelvis Wo Contrast  Result Date: 06/23/2017 CLINICAL DATA:  GI bleed. Melena. History of cervical cancer. History of appendiceal cancer. EXAM: CT ABDOMEN AND PELVIS WITHOUT CONTRAST TECHNIQUE: Multidetector CT imaging of the abdomen and pelvis was performed following the standard protocol without IV contrast. COMPARISON:  05/15/2017 FINDINGS: Lower chest: The heart is enlarged. Tiny pericardial effusion is similar to prior. There is dependent atelectasis in the lower lungs with moderate right and small left pleural effusions. Hepatobiliary: No focal abnormality in the liver on this study without intravenous contrast. Gallbladder is decompressed. The possible tiny stone in the gallbladder neck. No intrahepatic or extrahepatic biliary dilation. Pancreas: No focal mass lesion. No dilatation of the main duct. No intraparenchymal cyst. No peripancreatic edema. Spleen: No splenomegaly. No focal mass lesion. Adrenals/Urinary Tract: No adrenal nodule or mass. Left kidney is atrophic as before. There is new bilateral mild hydroureteronephrosis. The  bladder is distended. Tiny air bubble in the bladder lumen may be related to recent instrumentation although bladder infection can cause this appearance. Stomach/Bowel: Stomach is mildly distended and fluid-filled. Duodenum unremarkable. No overt small bowel obstruction. The terminal ileum is normal. Suture line in the region of the mid transverse colon  is compatible with the clinical history of enterocolostomy from the mid ileum to the mid transverse colon. Left colon unremarkable. There is edema a congestion of the small bowel mesentery with small pockets of interloop mesenteric fluid. Vascular/Lymphatic: There is abdominal aortic atherosclerosis without aneurysm. There is no gastrohepatic or hepatoduodenal ligament lymphadenopathy. No intraperitoneal or retroperitoneal lymphadenopathy. No pelvic sidewall lymphadenopathy. Reproductive: Uterus appears enlarged. Endometrium is thickened in this patient with a history of cervical cancer. There is no adnexal mass. Other: Focal fluid collection is identified in the right lower quadrant. This is difficult to assess without oral or intravenous contrast material, but it appears to have a circumferential rim and does not appear to represent bowel. There is another fluid collection inferior to the liver tracking down the right peritoneal cavity and into the right para colic gutter. This collection measures 8.8 cm in craniocaudal length by 7.4 cm and AP dimension by 2.5 cm coronally. This also has a rim of increased attenuation raising concern for abscess. Musculoskeletal: Open midline wound identified with packing material. Diffuse body wall edema evident. Stable appearance sclerotic lesion right acetabulum. Bone windows reveal no worrisome lytic or sclerotic osseous lesions. IMPRESSION: 1. Patient is approximately 3 weeks out from diagnostic laparoscopy with abscess drainage, repair of small bowel perforation, and entero colostomy. On today's exam, there are 2 apparent  fluid collections identified, but are difficult to assess given the lack of intravenous and oral contrast. One of these is just inferior to the liver in tracks down the right paracolic gutter. The second is in the right lower quadrant mesentery. Given the history of recent surgery both could represent intraperitoneal abscess. However, with the history of appendiceal carcinoma, pseudomyxoma peritonei is also a consideration. 2. No evidence for bowel obstruction. No bowel wall thickening is evident. There is fairly diffuse mesenteric edema and vascular congestion with scattered collections of interloop mesenteric fluid. The degree of these changes is more advanced than would be expected 3 weeks out from surgery. 3. Dependent atelectasis in both lower lungs with moderate right and small left pleural effusions. 4. Tiny air bubble in the urinary bladder may be from recent instrumentation, but bladder infection can have this appearance. 5. Thickening of the endometrial stripe in this patient with history of cervical cancer. Electronically Signed   By: Misty Stanley M.D.   On: 06/23/2017 20:32   Ct Head Wo Contrast  Result Date: 07/04/2017 CLINICAL DATA:  Follow-up CVA EXAM: CT HEAD WITHOUT CONTRAST TECHNIQUE: Contiguous axial images were obtained from the base of the skull through the vertex without intravenous contrast. COMPARISON:  06/28/2017; brain MRI - 07/01/2017; 06/28/2017 FINDINGS: Brain: Re- demonstrated atrophy with sulcal prominence. Old right frontal lobe infarct with associated dystrophic calcifications. Old left cerebellar hemisphere infarct / postoperative change. Periventricular hypodensities compatible microvascular ischemic disease. Suspected punctate evolving lacunar infarct at previously suspected tiny left thalamic infarct (image 14, series 3). Given extensive background parenchymal abnormalities, there is no CT evidence superimposed acute large territory infarct. No intraparenchymal or extra-axial  mass or hemorrhage as previously noted tiny bilateral subdural hematomas are not well demonstrated on the present examination. Vascular: Intracranial atherosclerosis. Skull: Stable sequela of left suboccipital craniotomy. Old right frontal lobe burr hole. No displaced calvarial fracture. Sinuses/Orbits: Scattered opacification of the bilateral frontal sinuses as well as the left anterior ethmoidal air cells, unchanged. Unchanged punctate (approximately 0.5 cm) osteoid osteoma within the left anterior ethmoidal air cell (image 15, series 4). Remaining paranasal sinuses and mastoid air cells are normally  aerated. No air-fluid levels. Other: Regional soft tissues appear normal. IMPRESSION: 1. Suspected evolving encephalomalacia at suspected left thalamic lacunar infarct. No superimposed acute large territory infarct. 2. Previously identified tiny bilateral subdural hematoma as not definitely seen on the present examination. No new or enlarging intraparenchymal or extra-axial hemorrhage. 3. Stable sequela of prior right frontal lobe infarct. 4. Stable sequela of prior left occipital lobe infarct/postsurgical change. Electronically Signed   By: Sandi Mariscal M.D.   On: 07/04/2017 10:01   Mr Brain Wo Contrast  Result Date: 06/28/2017 CLINICAL DATA:  66 y/o F; patient presenting with increased lethargy, garbled speech, and facial droop. History seizures, hemorrhagic aneurysm, cervical and metastatic appendiceal carcinoma, and recent abdominal surgery. EXAM: MRI HEAD WITHOUT CONTRAST TECHNIQUE: Multiplanar, multiecho pulse sequences of the brain and surrounding structures were obtained without intravenous contrast. COMPARISON:  06/28/2017 CT of the head. FINDINGS: Brain: No diffusion signal abnormality to suggest acute or early subacute infarction. Stable left cerebellar hemisphere encephalomalacia. Stable right frontal lobe encephalomalacia and with central mineralization. Moderate diffuse brain parenchymal volume loss.  Scattered foci of T2 FLAIR hyperintense signal abnormality in periventricular white matter are nonspecific but compatible with chronic microvascular ischemic changes. There is diffusely increased susceptibility hypointensity throughout the vascular structures of the brain. Vascular: Normal flow voids. Skull and upper cervical spine: Normal marrow signal. Postsurgical changes related to a left suboccipital craniectomy. Sinuses/Orbits: Partial opacification of the left-greater-than-right frontal sinuses and left anterior ethmoid air cells. Other: None. IMPRESSION: 1. No acute intracranial abnormality identified. 2. Left cerebellar hemisphere encephalomalacia and right frontal lobe encephalomalacia with central mineralization. 3. Diffusely increased susceptibility hypointensity throughout vascular structures in the brain, question recent iron replacement therapy. A component of siderosis may also be present. 4. Partial opacification of left-greater-than-right frontal sinuses and left anterior ethmoid air cells. 5. Mild chronic microvascular ischemic changes and moderate parenchymal volume loss of the brain. Electronically Signed   By: Kristine Garbe M.D.   On: 06/28/2017 22:25   Mr Jeri Cos QM Contrast  Result Date: 07/01/2017 CLINICAL DATA:  Encephalopathy. History of seizure disorder and metastatic appendiceal cancer. EXAM: MRI HEAD WITHOUT AND WITH CONTRAST TECHNIQUE: Multiplanar, multiecho pulse sequences of the brain and surrounding structures were obtained without and with intravenous contrast. CONTRAST:  20 mL MultiHance COMPARISON:  06/28/2017 noncontrast brain MRI FINDINGS: The study is severely motion degraded. Brain: There is a 2 mm focus of mildly increased trace diffusion signal and apparently reduced ADC in the dorsal thalamus on axial diffusion imaging, not confirmed on coronal images although assessment is limited by slice selection. No acute infarct is identified elsewhere. Chronic right  frontal lobe encephalomalacia is again noted with central calcification. There is also chronic left cerebellar encephalomalacia. Scattered T2 hyperintensities elsewhere in the cerebral white matter bilaterally are nonspecific but compatible with mild chronic small vessel ischemic disease. There is new abnormal T1 and FLAIR hyperintensity in the extra-axial space overlying the posterior cerebral convexities bilaterally most compatible with thin subdural hematomas measuring 2 mm in thickness each. There is no associated mass effect. A small amount of abnormal sulcal FLAIR hyperintensity at the vertex on the right (series 8, image 24) is favored to reflect artifactual incomplete FLAIR suppression due to regional susceptibility without evidence of subarachnoid hemorrhage elsewhere. There is moderate cerebral atrophy. No abnormal enhancement is identified to suggest metastatic disease. Vascular: Major intracranial vascular flow voids are preserved. Skull and upper cervical spine: Diffusely diminished bone marrow signal intensity likely related to patient's anemia. Sinuses/Orbits: Unremarkable  orbits. Similar appearance of partial left frontal sinus opacification. No significant scratched a trace bilateral mastoid fluid. Other: None. IMPRESSION: 1. Severely motion degraded examination. 2. New tiny bilateral subdural hematomas over both posterior cerebral convexities. No mass effect. 3. Minimal sulcal signal abnormality at the right vertex favored to reflect artifact rather than trace subarachnoid hemorrhage. 4. Punctate focus of diffusion abnormality in the dorsal thalamus, favor artifact over tiny infarct. 5. 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 6. No evidence of metastatic disease within limitations of motion. Critical Value/emergent results were called by telephone at the time of interpretation on 07/01/2017 at 2:01 pm to Dr. [place] Pink, who verbally acknowledged these results. Electronically Signed    By: Logan Bores M.D.   On: 07/01/2017 14:05   Dg Swallowing Func-speech Pathology  Result Date: 07/08/2017 Objective Swallowing Evaluation: Type of Study: MBS-Modified Barium Swallow Study Patient Details Name: Shantil Vallejo MRN: [id number] Date of Birth: 15-Jan-1951 Today's Date: 07/08/2017 Time: SLP Start Time (ACUTE ONLY): 1335-SLP Stop Time (ACUTE ONLY): 1355 SLP Time Calculation (min) (ACUTE ONLY): 20 min Past Medical History: Past Medical History: Diagnosis Date . Anemia 2017  3u blood 2017 . Arthritis   KNEES . Carcinoma of appendix ([place]) 04/11/2016  Patient with a history of cervical cancer status post chemoradiation with a persistent finding of a dilated appendix with hypermetabolism. This is raised a concern of malignancy. We'll plan to move forward with laparoscopic possible open appendectomy to rule out malignancy.  . Cervical cancer, FIGO stage IIB ([place]) 05/24/2015 . 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 Depression  . Endometrial carcinoma [facility])   goes to [facility] in [place] for chemotherapy . GERD (gastroesophageal reflux disease)  . Heart murmur  . History of blood transfusion  . History of cerebral aneurysm repair   1997--  hemarrhagic aneurysm x2 --- s/p  left posterior fossa craniectomy . History of radiation therapy 07/28/15-08/26/15  cervical region 27.5 gray . History of seizures   post-op craniotomy for aneurysm 1997-- controlled w/ medication since then no seizures . Hyperlipidemia  . Hypertension  . PMB (postmenopausal bleeding)  . Pulmonary nodule 03/26/2017 . Seizures ([place])   hx of years ago  . Type 2 diabetes, diet controlled ([place])   diet controlled  Past Surgical History: Past Surgical History: Procedure Laterality Date . COLONOSCOPY   . CRANIOTOMY POSTERIOR FOSSA  1997  Repair aneurysm  x2  left side . LAPAROSCOPIC APPENDECTOMY N/A 04/19/2016   Procedure: APPENDECTOMY LAPAROSCOPIC;  Surgeon: Hall Busing, MD;  Location: [facility];  Service: General;  Laterality: N/A; . LAPAROSCOPY N/A 05/30/2017  Procedure: LAPAROSCOPY DIAGNOSTIC WITH PERITONEAL BIOPSY;  Surgeon: Jackolyn Confer, MD;  Location: [facility];  Service: General;  Laterality: N/A; . LAPAROTOMY  05/30/2017  Procedure: EXPLORATORY LAPAROTOMY REPAIR WITH DRAINAGE OF INTRA ABDOMINAL ABSCESS OF SMALL BOWEL PERFORATION WITH ENTEROCOLOSTOMY;  Surgeon: Jackolyn Confer, MD;  Location: [facility];  Service: General;; . PORT-A-CATH PLACEMENT  06/  2016 . TANDEM RING INSERTION N/A 07/28/2015  Procedure: TANDEM RING PLACEMENT;  Surgeon: Gery Pray, MD;  Location: [facility];  Service: Urology;  Laterality: N/A; . TANDEM RING INSERTION N/A 08/05/2015  Procedure: TANDEM RING PLACEMENT ;  Surgeon: Gery Pray, MD;  Location: [facility];  Service: Urology;  Laterality: N/A; . TANDEM RING INSERTION N/A 08/11/2015  Procedure: TANDEM RING PLACEMENT ;  Surgeon:  Gery Pray, MD;  Location: [facility];  Service: Urology;  Laterality: N/A; . TANDEM RING INSERTION N/A 08/17/2015  Procedure: TANDEM RING PLACEMENT ;  Surgeon: Gery Pray, MD;  Location: [facility];  Service: Urology;  Laterality: N/A; . 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 She was recently hospitalized for small bowel obstruction and underwent diagnostic laparoscopy in June. She underwent repair of small bowel perforation. She had a prolonged hospital stay. Subsequently was discharged to skilled  nursing facility. Presented back with rectal bleeding. Seen by oncology. Not thought to be a candidate for any treatment. Then she had what appeared to be a seizure resulting in encephalopathy. Neurology was consulted; pt with acute left thalamic CVA, bilateral small SDH. Initially evaluated by SLP 06/29/17 with recommendations for dys 2, thin liquids, however pt made NPO with cortrak placed per MD with change in MS, SLP s/o on 7/31 as pt remained unresponsive.  Subjective: "i just want some coffee" Assessment / Plan / Recommendation CHL IP CLINICAL IMPRESSIONS 07/08/2017 Clinical Impression Patient presents with moderate oral and mild pharyngeal phase dysphagia which appears cognitively and sensory-based. Oral stage of swallowing notable for intermittent oral holding, delayed oral transit, impaired mastication and weak lingual manipulation. Swallow initiation was delayed to level of the pyriform sinuses with thin liquids, resulting in deep penetration before the swallow with larger cup sips of thin liquids, and silent aspiration x1 with straw sip. Cued throat clear sufficient for airway clearance. Adequate tongue base retraction and pharyngeal constriction; noted sluggish hyolaryngeal excursion which intermittently slowed bolus passage through the UES. No abnormal residue remained in the pharynx after the swallow with any consistency trialed. Recommend initiating dys 2 diet with thin liquids, no straws, full supervision to monitor for small bites/sips. Cue pt for small, single sips of liquid, clear throat intermittently. SLP will monitor for tolerance, advancement of solids. Pt may benefit from removal of cortrak to facilitate comfort with PO intake.  SLP Visit Diagnosis Dysphagia, oropharyngeal phase (R13.12) Attention and concentration deficit following -- Frontal lobe and executive function deficit following -- Impact on safety and function Mild aspiration risk;Moderate aspiration risk   CHL IP TREATMENT  RECOMMENDATION 07/08/2017 Treatment Recommendations Therapy as outlined in treatment plan below   Prognosis 07/08/2017 Prognosis for Safe Diet Advancement Good Barriers to Reach Goals Cognitive deficits Barriers/Prognosis Comment -- [facility] IP DIET RECOMMENDATION 07/08/2017 SLP Diet Recommendations Dysphagia 2 (Fine chop) solids;Thin liquid Liquid Administration via Cup;No straw Medication Administration Whole meds with puree Compensations Slow rate;Small sips/bites;Clear throat intermittently Postural Changes Seated upright at 90 degrees   CHL IP OTHER RECOMMENDATIONS 07/08/2017 Recommended Consults -- Oral Care Recommendations Oral care BID Other Recommendations --   [facility] IP FOLLOW UP RECOMMENDATIONS 07/08/2017 Follow up Recommendations Skilled Nursing facility   [facility] IP FREQUENCY AND DURATION 07/08/2017 Speech Therapy Frequency (ACUTE ONLY) min 2x/week Treatment Duration 2 weeks      [facility] IP ORAL PHASE 07/08/2017 Oral Phase Impaired Oral - Pudding Teaspoon -- Oral - Pudding Cup -- Oral - Honey Teaspoon -- Oral - Honey Cup -- Oral - Nectar Teaspoon Holding of bolus;Delayed oral transit Oral - Nectar Cup Holding of bolus;Delayed oral  transit Oral - Nectar Straw Holding of bolus;Delayed oral transit Oral - Thin Teaspoon -- Oral - Thin Cup Holding of bolus;Delayed oral transit Oral - Thin Straw Holding of bolus;Delayed oral transit Oral - Puree Holding of bolus;Delayed oral transit;Reduced posterior propulsion Oral - Mech Soft -- Oral - Regular Impaired mastication;Delayed oral transit;Reduced posterior propulsion Oral - Multi-Consistency -- Oral - Pill Holding of bolus;Lingual pumping;Weak lingual manipulation;Delayed oral transit Oral Phase - Comment --  [facility] IP PHARYNGEAL PHASE 07/08/2017 Pharyngeal Phase Impaired Pharyngeal- Pudding Teaspoon -- Pharyngeal -- Pharyngeal- Pudding Cup -- Pharyngeal -- Pharyngeal- Honey Teaspoon -- Pharyngeal -- Pharyngeal- Honey Cup -- Pharyngeal -- Pharyngeal- Nectar Teaspoon -- Pharyngeal -- Pharyngeal-  Nectar Cup Delayed swallow initiation-vallecula Pharyngeal -- Pharyngeal- Nectar Straw -- Pharyngeal -- Pharyngeal- Thin Teaspoon -- Pharyngeal -- Pharyngeal- Thin Cup Delayed swallow initiation-pyriform sinuses;Penetration/Aspiration before swallow Pharyngeal Material does not enter airway;Material enters airway, CONTACTS cords and not ejected out Pharyngeal- Thin Straw Delayed swallow initiation-pyriform sinuses;Penetration/Aspiration before swallow;Trace aspiration Pharyngeal Material enters airway, CONTACTS cords and not ejected out;Material enters airway, passes BELOW cords without attempt by patient to eject out (silent aspiration) Pharyngeal- Puree Delayed swallow initiation-vallecula Pharyngeal -- Pharyngeal- Mechanical Soft -- Pharyngeal -- Pharyngeal- Regular Delayed swallow initiation-vallecula Pharyngeal -- Pharyngeal- Multi-consistency -- Pharyngeal -- Pharyngeal- Pill -- Pharyngeal -- Pharyngeal Comment --  [facility]L IP CERVICAL ESOPHAGEAL PHASE 07/08/2017 Cervical Esophageal Phase WFL Pudding Teaspoon -- Pudding Cup -- Honey Teaspoon -- Honey Cup -- Nectar Teaspoon -- Nectar Cup -- Nectar Straw -- Thin Teaspoon -- Thin Cup -- Thin Straw -- Puree -- Mechanical Soft -- Regular -- Multi-consistency -- Pill -- Cervical Esophageal Comment -- No flowsheet data found. Aliene Altes 07/08/2017, 3:04 PM  Deneise Lever, MS, CCC-SLP Speech-Language Pathologist [phone]             Ct Head Code Stroke Wo Contrast  Result Date: 06/28/2017 CLINICAL DATA:  Code stroke.  Left facial droop. EXAM: CT HEAD WITHOUT CONTRAST TECHNIQUE: Contiguous axial images were obtained from the base of the skull through the vertex without intravenous contrast. COMPARISON:  06/17/2017 FINDINGS: Brain: A moderate-sized region of encephalomalacia with associated calcification in the high right frontal lobe is unchanged. Left cerebellar encephalomalacia underlying a craniectomy defect is also unchanged. There is no evidence of acute infarct,  mass, midline shift, or extra-axial fluid collection. Cerebral atrophy is unchanged. Patchy bilateral cerebral white matter hypodensities are similar to the prior study and nonspecific but compatible with moderate chronic small vessel ischemic disease. Vascular: Calcified atherosclerosis at the skullbase. No hyperdense vessel. Skull: Left suboccipital craniectomy. Right frontal burr hole. No acute fracture or suspicious osseous lesion. Sinuses/Orbits: Slightly progressive left frontal sinus mucosal thickening. Clear mastoid air cells. Unremarkable orbits. Other: None. ASPECTS [facility] Stroke Program Early CT Score) Not scored due to chronic right frontal encephalomalacia changes. IMPRESSION: 1. No evidence of acute intracranial abnormality. 2. 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 Results sent via text page to Dr. Rory Percy on 06/28/2017 at 2:54 p.m. Electronically Signed   By: Logan Bores M.D.   On: 06/28/2017 14:54    Lab Data:  CBC:  Recent Labs Lab 07/06/17 0500 07/07/17 0457 07/08/17 1158 07/09/17 0412 07/10/17 0403  WBC 10.9* 10.0 10.3 10.0 9.1  HGB 7.6* 7.4* 7.4* 7.4* 7.3*  HCT 24.8* 24.6* 24.7* 25.2* 24.2*  MCV 89.5 91.8 92.5 92.0 92.4  PLT 311 258 258 271 734   Basic Metabolic Panel:  Recent Labs Lab 07/04/17 0510  07/06/17 0500 07/07/17 0457  07/08/17 1158 07/09/17 0412 07/10/17 0403  NA 135  < > 140 141 146* 143 142  K 3.0*  < > 3.1* 3.3* 4.0 3.6 3.7  CL 99*  < > 107 109 114* 113* 111  CO2 31  < > 30 27 27 26 27   GLUCOSE 141*  < > 116* 107* 136* 132* 151*  BUN 5*  < > 11 12 15 14 12   CREATININE 0.73  < > 0.70 0.58 0.58 0.55 0.55  CALCIUM 7.3*  < > 7.2* 7.4* 7.5* 7.7* 7.7*  MG 1.3*  --   --  1.4*  --   --   --   < > = values in this interval not displayed. GFR: Estimated Creatinine Clearance: 82.4 mL/min (by C-G formula based on SCr of 0.55 mg/dL). Liver Function Tests: No results for input(s): AST, ALT, ALKPHOS, BILITOT, PROT, ALBUMIN in the  last 168 hours. No results for input(s): LIPASE, AMYLASE in the last 168 hours. No results for input(s): AMMONIA in the last 168 hours. Coagulation Profile:  Recent Labs Lab 07/05/17 1330  INR 1.62   Cardiac Enzymes: No results for input(s): CKTOTAL, CKMB, CKMBINDEX, TROPONINI in the last 168 hours. BNP (last 3 results) No results for input(s): PROBNP in the last 8760 hours. HbA1C: No results for input(s): HGBA1C in the last 72 hours. CBG:  Recent Labs Lab 07/09/17 1949 07/10/17 0001 07/10/17 0353 07/10/17 0754 07/10/17 1152  GLUCAP 117* 125* 144* 138* 113*   Lipid Profile: No results for input(s): CHOL, HDL, LDLCALC, TRIG, CHOLHDL, LDLDIRECT in the last 72 hours. Thyroid Function Tests: No results for input(s): TSH, T4TOTAL, FREET4, T3FREE, THYROIDAB in the last 72 hours. Anemia Panel: No results for input(s): VITAMINB12, FOLATE, FERRITIN, TIBC, IRON, RETICCTPCT in the last 72 hours. Urine analysis:    Component Value Date/Time   COLORURINE STRAW (A) 07/02/2017 1704   APPEARANCEUR CLEAR 07/02/2017 1704   LABSPEC 1.005 07/02/2017 1704   LABSPEC 1.010 08/17/2015 1542   PHURINE 8.0 07/02/2017 1704   GLUCOSEU NEGATIVE 07/02/2017 1704   GLUCOSEU 100 08/17/2015 1542   HGBUR SMALL (A) 07/02/2017 1704   BILIRUBINUR NEGATIVE 07/02/2017 1704   BILIRUBINUR Negative 08/17/2015 [place] 07/02/2017 1704   PROTEINUR NEGATIVE 07/02/2017 1704   UROBILINOGEN 1.0 08/26/2015 0725   UROBILINOGEN 0.2 08/17/2015 1542   NITRITE NEGATIVE 07/02/2017 1704   LEUKOCYTESUR NEGATIVE 07/02/2017 1704   LEUKOCYTESUR Trace 08/17/2015 1542     Ripudeep Rai M.D. [facility] 07/10/2017, 1:24 PM  Pager: [phone] Between 7am to 7pm - call Pager - [phone]  After 7pm go to [email or web address] - password [email or web address]  Call night coverage person covering after 7pm

## 2017-07-11 LAB — GLUCOSE, CAPILLARY
GLUCOSE-CAPILLARY: 114 mg/dL — AB (ref 65–99)
GLUCOSE-CAPILLARY: 119 mg/dL — AB (ref 65–99)
GLUCOSE-CAPILLARY: 124 mg/dL — AB (ref 65–99)
Glucose-Capillary: 94 mg/dL (ref 65–99)
Glucose-Capillary: 120 mg/dL — ABNORMAL HIGH (ref 65–99)
Glucose-Capillary: 139 mg/dL — ABNORMAL HIGH (ref 65–99)

## 2017-07-11 LAB — CBC
HCT: 23.6 % — ABNORMAL LOW (ref 36.0–46.0)
HEMOGLOBIN: 7.2 g/dL — AB (ref 12.0–15.0)
MCH: 27.7 pg (ref 26.0–34.0)
MCHC: 30.5 g/dL (ref 30.0–36.0)
MCV: 90.8 fL (ref 78.0–100.0)
Platelets: 276 10*3/uL (ref 150–400)
RBC: 2.6 MIL/uL — AB (ref 3.87–5.11)
RDW: 18.3 % — ABNORMAL HIGH (ref 11.5–15.5)
WBC: 9.7 10*3/uL (ref 4.0–10.5)

## 2017-07-11 LAB — BASIC METABOLIC PANEL
ANION GAP: 5 (ref 5–15)
BUN: 12 mg/dL (ref 6–20)
CALCIUM: 7.8 mg/dL — AB (ref 8.9–10.3)
CO2: 27 mmol/L (ref 22–32)
Chloride: 107 mmol/L (ref 101–111)
Creatinine, Ser: 0.48 mg/dL (ref 0.44–1.00)
Glucose, Bld: 133 mg/dL — ABNORMAL HIGH (ref 65–99)
Potassium: 3.9 mmol/L (ref 3.5–5.1)
Sodium: 139 mmol/L (ref 135–145)

## 2017-07-11 LAB — MRSA PCR SCREENING: MRSA by PCR: NEGATIVE

## 2017-07-11 LAB — MAGNESIUM: MAGNESIUM: 1.4 mg/dL — AB (ref 1.7–2.4)

## 2017-07-11 MED ORDER — MAGNESIUM SULFATE 2 GM/50ML IV SOLN
2.0000 g | Freq: Once | INTRAVENOUS | Status: AC
  Administered 2017-07-11: 2 g via INTRAVENOUS
  Filled 2017-07-11: qty 50

## 2017-07-11 NOTE — Progress Notes (Signed)
PROGRESS NOTE    Lori [name]  QMG:[id number] DOB: 19-Feb-1951 DOA: 06/23/2017 PCP: [name], MD   Chief Complaint  Patient presents with  . 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 She was at a skilled nursing facility. She was recently hospitalized for small bowel obstruction and underwent diagnostic laparoscopy in June. She underwent repair of small bowel perforation. She had a prolonged hospital stay. Subsequently was discharged to skilled nursing facility. Presented back with rectal bleeding. Seen by oncology. Not thought to be a candidate for any treatment. Then she had what appears to be a seizure resulting in encephalopathy. Neurology was consulted. Palliative medicine continues to follow. Family still desires aggressive care. Patient was transferred to stepdown unit. Critical care medicine was consulted. Patient noted to have profuse urine output. 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  Patient improving. Had MBS, started on dysphagia 2 diet however still poor oral intake, therefore NGT still in place. Patient transitioned to oral Keppra, Tegretol. Mental status improving. Neurology felt no need for lumbar puncture as mental status has been improving.  Assessment & Plan   Acute metabolic encephalopathy/Left Thalamic Stroke/ Bilateral small SDH -Patient was noted to have garbled speech on the afternoon of 06/28/2017 -Initial concern for stroke -Neurology consultation appreciated -CT head on 8/1: Evolving encephalomalacia at suspected left thalamic lacunar infarct. Previously identified tiny bilateral subdural hematoma -MRI brain on 7/29: New tiny bilateral subdural hematomas, trace subarachnoid hemorrhage, punctate focus  of diffusion abnormality in the dorsal thalamus -It was thought the patient was having seizure activity and she was loaded with Keppra -Patient has had continuous EEG monitoring: Progressively improving diffuse encephalopathy  -Mental status and speech currently improving -Neurology feels lumbar puncture is no longer necessary given that her mental status is improving -Patient had modified barium swallow, was placed on dysphagia 2 diet  History of seizure disorder -Patient was on carbamazepine at home however this is not initiated at time of admission -Treatment plan as above, on oral Tegretol and Keppra  Diabetes insipidus/polyuria -Patient had excessive urine output thought to be due to central diabetes insipidus due to abnormalities noted on MRI of the brain -Sodium level was also elevated with high serum osmolality 100 -She was given DDAVP -Urine output over the past 24 hours 1400cc -Currently sodim 139  Primary appendiceal carcinoma with metastatic disease, stage IV -Oncology consultation appreciated, patient is not a candidate for chemotherapy and is thought to have an incurable condition. Poor prognosis -Palliative care was consulted. 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 surgery consulted and following  Rectal bleeding of unknown cause -Appears to be stable, patient is not a candidate for endoscopic evaluation due to recent surgery. This was discussed by previous hospitalist with general surgery.  Anemia secondary to acute blood loss -Hemoglobin currently 7.2, appears stable -transfuse if hemoglobin <7 -Continue to monitor CBC  Abdominal fluid collection on CT scan -Previous hospitalist discussed this with interventional radiologist -Patient was on Zosyn however this was discontinued as it could lower seizure threshold. Transition to cefepime and Flagyl. Patient was on Unasyn secondary to enterococcus urinary tract infection, completed  course.  Diabetes mellitus, type II -Continue insulin sliding scale CBG monitoring  Essential hypertension -Continue clonidine, hydralazine as needed  Enterococcus UTI -As above, patient was on Zosyn  however transitioned to and completed Unasyn   Nutrition -Patient continues to be encephalopathic and had poor oral intake -Speech following, placed on dysphagia diet starting 07/10/2017 -Still has NG tube in place given that her oral intake has been poor  Hypokalemia/hypomagnesemia -Potassium 3.9, magnesium 1.4 -Will replace with IV magnesium and continue to monitor BMP  Goals of care -Palliative care was consulted and appreciated however family wishes to continue aggressive care  DVT Prophylaxis  SCDs  Code Status: Full  Family Communication: None at bedside  Disposition Plan: Admitted. Pending PT consult. Currently on tube feeds   Consultants PCCM General surgery Neurology  Oncology Palliative care  Procedures  Continuous EEG  Antibiotics   Anti-infectives    Start     Dose/Rate Route Frequency Ordered Stop   07/04/17 1100  Ampicillin-Sulbactam (UNASYN) 3 g in sodium chloride 0.9 % 100 mL IVPB     3 g 200 mL/hr over 30 Minutes Intravenous Every 6 hours 07/04/17 0823 07/10/17 0429   07/03/17 1600  Ampicillin-Sulbactam (UNASYN) 3 g in sodium chloride 0.9 % 100 mL IVPB  Status:  Discontinued     3 g 200 mL/hr over 30 Minutes Intravenous Every 6 hours 07/03/17 1047 07/04/17 0823   06/29/17 1400  ceFEPIme (MAXIPIME) 2 g in dextrose 5 % 50 mL IVPB  Status:  Discontinued     2 g 100 mL/hr over 30 Minutes Intravenous Every 8 hours 06/29/17 0853 06/29/17 0857   06/29/17 1000  ceFEPIme (MAXIPIME) 2 g in dextrose 5 % 50 mL IVPB  Status:  Discontinued     2 g 100 mL/hr over 30 Minutes Intravenous Every 8 hours 06/29/17 0857 07/03/17 1046   06/29/17 0930  metroNIDAZOLE (FLAGYL) IVPB 500 mg  Status:  Discontinued     500 mg 100 mL/hr over 60 Minutes Intravenous Every 8  hours 06/29/17 0838 06/30/17 0956   06/24/17 0600  piperacillin-tazobactam (ZOSYN) IVPB 3.375 g  Status:  Discontinued     3.375 g 12.5 mL/hr over 240 Minutes Intravenous Every 8 hours 06/23/17 2254 06/29/17 0838   06/23/17 2300  piperacillin-tazobactam (ZOSYN) IVPB 3.375 g     3.375 g 100 mL/hr over 30 Minutes Intravenous  Once 06/23/17 2254 06/24/17 0056      Subjective:   [name] seen and examined today.   Currently has no complaints today. Denies current chest pain, shortness of breath, abdominal pain, nausea or vomiting, diarrhea.  Objective:   Vitals:   07/11/17 0029 07/11/17 0500 07/11/17 0509 07/11/17 1320  BP: (!) 147/79  (!) 149/82 (!) 153/84  Pulse: 92  89 93  Resp:   18 18  Temp:   99.7 F (37.6 C) 99.5 F (37.5 C)  TempSrc:   Oral Oral  SpO2:   99% 100%  Weight:  80.5 kg (177 lb 8 oz)    Height:        Intake/Output Summary (Last 24 hours) at 07/11/17 1423 Last data filed at 07/11/17 0935  Gross per 24 hour  Intake          1815.75 ml  Output             1450 ml  Net           365.75 ml   Filed Weights   07/09/17 0406 07/10/17 0354 07/11/17 0500  Weight: 79.1 kg (174 lb 6.1 oz) 92.8 kg (204 lb 9.4 oz) 80.5 kg (177 lb 8 oz)   Exam  General: Well developed,NAD  HEENT:  NCAT, mucous membranes moist. NG tube.  Cardiovascular: S1 S2 auscultated, no rubs, murmurs or gallops. Regular rate and rhythm.  Respiratory: Clear to auscultation bilaterally with equal chest rise  Abdomen: Soft, nontender, nondistended, + bowel sounds, abdominal dressing intact  Extremities: warm dry without cyanosis clubbing or edema  Neuro: AAOx3, strength decreased on left as compared to the right, otherwise, nonfocal   Skin: Without rashes exudates or nodules   Psych: Normal affect and demeanor with intact judgement and insight  Data Reviewed: I have personally reviewed following labs and imaging studies  CBC:  Recent Labs Lab 07/07/17 0457 07/08/17 1158  07/09/17 0412 07/10/17 0403 07/11/17 0342  WBC 10.0 10.3 10.0 9.1 9.7  HGB 7.4* 7.4* 7.4* 7.3* 7.2*  HCT 24.6* 24.7* 25.2* 24.2* 23.6*  MCV 91.8 92.5 92.0 92.4 90.8  PLT 258 258 271 259 161   Basic Metabolic Panel:  Recent Labs Lab 07/07/17 0457 07/08/17 1158 07/09/17 0412 07/10/17 0403 07/11/17 0342  NA 141 146* 143 142 139  K 3.3* 4.0 3.6 3.7 3.9  CL 109 114* 113* 111 107  CO2 27 27 26 27 27   GLUCOSE 107* 136* 132* 151* 133*  BUN 12 15 14 12 12   CREATININE 0.58 0.58 0.55 0.55 0.48  CALCIUM 7.4* 7.5* 7.7* 7.7* 7.8*  MG 1.4*  --   --   --  1.4*   GFR: Estimated Creatinine Clearance: 77 mL/min (by C-G formula based on SCr of 0.48 mg/dL). Liver Function Tests: No results for input(s): AST, ALT, ALKPHOS, BILITOT, PROT, ALBUMIN in the last 168 hours. No results for input(s): LIPASE, AMYLASE in the last 168 hours. No results for input(s): AMMONIA in the last 168 hours. Coagulation Profile:  Recent Labs Lab 07/05/17 1330  INR 1.62   Cardiac Enzymes: No results for input(s): CKTOTAL, CKMB, CKMBINDEX, TROPONINI in the last 168 hours. BNP (last 3 results) No results for input(s): PROBNP in the last 8760 hours. HbA1C: No results for input(s): HGBA1C in the last 72 hours. CBG:  Recent Labs Lab 07/10/17 2039 07/11/17 0001 07/11/17 0424 07/11/17 0749 07/11/17 1259  GLUCAP 106* 114* 119* 94 120*   Lipid Profile: No results for input(s): CHOL, HDL, LDLCALC, TRIG, CHOLHDL, LDLDIRECT in the last 72 hours. Thyroid Function Tests: No results for input(s): TSH, T4TOTAL, FREET4, T3FREE, THYROIDAB in the last 72 hours. Anemia Panel: No results for input(s): VITAMINB12, FOLATE, FERRITIN, TIBC, IRON, RETICCTPCT in the last 72 hours. Urine analysis:    Component Value Date/Time   COLORURINE STRAW (A) 07/02/2017 1704   APPEARANCEUR CLEAR 07/02/2017 1704   LABSPEC 1.005 07/02/2017 1704   LABSPEC 1.010 08/17/2015 1542   PHURINE 8.0 07/02/2017 1704   GLUCOSEU NEGATIVE  07/02/2017 1704   GLUCOSEU 100 08/17/2015 1542   HGBUR SMALL (A) 07/02/2017 1704   BILIRUBINUR NEGATIVE 07/02/2017 1704   BILIRUBINUR Negative 08/17/2015 1542   KETONESUR NEGATIVE 07/02/2017 1704   PROTEINUR NEGATIVE 07/02/2017 1704   UROBILINOGEN 1.0 08/26/2015 0725   UROBILINOGEN 0.2 08/17/2015 1542   NITRITE NEGATIVE 07/02/2017 1704   LEUKOCYTESUR NEGATIVE 07/02/2017 1704   LEUKOCYTESUR Trace 08/17/2015 1542   Sepsis Labs: @LABRCNTIP (procalcitonin:4,lacticidven:4)  ) Recent Results (from the past 240 hour(s))  MRSA PCR Screening     Status: None   Collection Time: 07/11/17  8:58 AM  Result Value Ref Range Status   MRSA by PCR NEGATIVE NEGATIVE Final    Comment:        The GeneXpert MRSA Assay (FDA approved for NASAL specimens only),  is one component of a comprehensive MRSA colonization surveillance program. It is not intended to diagnose MRSA infection nor to guide or monitor treatment for MRSA infections.       Radiology Studies: No results found.   Scheduled Meds: . carbamazepine  150 mg Oral TID  . cloNIDine  0.1 mg Transdermal Weekly  . feeding supplement (PRO-STAT SUGAR FREE 64)  30 mL Per Tube TID  . folic acid  1 mg Per Tube Daily  . insulin aspart  0-15 Units Subcutaneous Q4H  . levETIRAcetam  1,000 mg Oral BID  . metoprolol tartrate  5 mg Intravenous Q6H  . potassium chloride  40 mEq Oral Daily   Continuous Infusions: . dextrose 5 % with kcl 40 mL/hr at 07/11/17 0537  . feeding supplement (JEVITY 1.2 CAL) 1,000 mL (07/11/17 0926)     LOS: 18 days   Time Spent in minutes   30 minutes  Deandra Goering D.O. on 07/11/2017 at 2:23 PM  Between 7am to 7pm - Pager - [phone]  After 7pm go to [email or web address] - password [email or web address]  And look for the night coverage person covering for me after hours  [facility] Office  [phone]

## 2017-07-11 NOTE — Progress Notes (Signed)
PT Cancellation Note  Patient Details Name: Lori [name] MRN: [id number] DOB: 1951-09-20   Cancelled Treatment:    Reason Eval/Treat Not Completed: Patient declined, no reason specified. Pt eating lunch with family assistance, refusing to participate at this time. PT will continue to f/u as available.   Katy 07/11/2017, 1:54 PM

## 2017-07-11 NOTE — Progress Notes (Signed)
  Speech Language Pathology Treatment: Dysphagia  Patient Details Name: Lori [name] MRN: [id number] DOB: 02-16-1951 Today's Date: 07/11/2017 Time: 1015-1040 SLP Time Calculation (min) (ACUTE ONLY): 25 min  Assessment / Plan / Recommendation Clinical Impression  Pt seen for dysphagia treatment to check diet tolerance. Pt showed no overt s/s of aspiration with trials of nectar-thick liquids by cup; pt continues to be inconsistent with following cues to take small sips at a time. Pt refused trials of solids. Continues to show decreased awareness to situation and reasoning; reviewed recommendations from recent MBS along with rationale for current diet. Pt is unhappy with dysphagia 2 consistency; however, was unable to test other consistencies with food availability on floor. On recent visit pt held solid foods in cheek. Recommend continuing current diet with meds whole in puree, full supervision during meals to cue small bites/ sips. Will continue to follow for diet tolerance/ consider advancement as pt continues to be unhappy with food options on dysphagia 2 diet.  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 She was recently hospitalized for small bowel obstruction and underwent diagnostic laparoscopy in June. She underwent repair of small bowel perforation. She had a prolonged hospital stay. Subsequently was discharged to skilled nursing facility. Presented back with rectal bleeding. Seen by oncology. Not thought to be a candidate for any treatment. Then she had what appeared to be a seizure resulting in encephalopathy. Neurology was consulted; pt with acute left thalamic CVA, bilateral small SDH. Initially evaluated by SLP 06/29/17 with recommendations for dys 2, thin  liquids, however pt made NPO with cortrak placed per MD with change in MS, SLP s/o on 7/31 as pt remained unresponsive.       SLP Plan  Continue with current plan of care       Recommendations  Diet recommendations: Dysphagia 2 (fine chop);Nectar-thick liquid Liquids provided via: Cup;No straw Supervision: Staff to assist with self feeding;Full supervision/cueing for compensatory strategies Compensations: Slow rate;Small sips/bites Postural Changes and/or Swallow Maneuvers: Seated upright 90 degrees                Oral Care Recommendations: Oral care BID Follow up Recommendations: Skilled Nursing facility SLP Visit Diagnosis: Dysphagia, oropharyngeal phase (R13.12) Plan: Continue with current plan of care       [place], [place], [place] 07/11/2017, 10:43 AM [id number]

## 2017-07-11 NOTE — Progress Notes (Signed)
Pt with [employer] , pt will not qualify or obtain approval  for LTAC per [employer] liaison Carrina @ [phone]. Whitman Hero RN,BSN,CM [phone]

## 2017-07-12 DIAGNOSIS — Z09 Encounter for follow-up examination after completed treatment for conditions other than malignant neoplasm: Secondary | ICD-10-CM

## 2017-07-12 LAB — GLUCOSE, CAPILLARY
GLUCOSE-CAPILLARY: 132 mg/dL — AB (ref 65–99)
GLUCOSE-CAPILLARY: 152 mg/dL — AB (ref 65–99)
Glucose-Capillary: 94 mg/dL (ref 65–99)
Glucose-Capillary: 122 mg/dL — ABNORMAL HIGH (ref 65–99)
Glucose-Capillary: 130 mg/dL — ABNORMAL HIGH (ref 65–99)
Glucose-Capillary: 155 mg/dL — ABNORMAL HIGH (ref 65–99)
Glucose-Capillary: 127 mg/dL — ABNORMAL HIGH (ref 65–99)

## 2017-07-12 LAB — CBC
HCT: 24.3 % — ABNORMAL LOW (ref 36.0–46.0)
HEMOGLOBIN: 7.4 g/dL — AB (ref 12.0–15.0)
MCH: 27.3 pg (ref 26.0–34.0)
MCHC: 30.5 g/dL (ref 30.0–36.0)
MCV: 89.7 fL (ref 78.0–100.0)
Platelets: 248 10*3/uL (ref 150–400)
RBC: 2.71 MIL/uL — ABNORMAL LOW (ref 3.87–5.11)
RDW: 18 % — ABNORMAL HIGH (ref 11.5–15.5)
WBC: 9.2 10*3/uL (ref 4.0–10.5)

## 2017-07-12 LAB — BASIC METABOLIC PANEL
ANION GAP: 6 (ref 5–15)
BUN: 11 mg/dL (ref 6–20)
CALCIUM: 7.3 mg/dL — AB (ref 8.9–10.3)
CO2: 27 mmol/L (ref 22–32)
Chloride: 107 mmol/L (ref 101–111)
Creatinine, Ser: 0.44 mg/dL (ref 0.44–1.00)
GFR calc Af Amer: >60 mL/min (ref >60)
GFR calc non Af Amer: >60 mL/min (ref >60)
GLUCOSE: 132 mg/dL — AB (ref 65–99)
Potassium: 3.7 mmol/L (ref 3.5–5.1)
Sodium: 140 mmol/L (ref 135–145)

## 2017-07-12 LAB — MAGNESIUM: MAGNESIUM: 1.6 mg/dL — AB (ref 1.7–2.4)

## 2017-07-12 MED ORDER — MAGNESIUM SULFATE 2 GM/50ML IV SOLN
2.0000 g | Freq: Once | INTRAVENOUS | Status: AC
  Administered 2017-07-12: 2 g via INTRAVENOUS
  Filled 2017-07-12: qty 50

## 2017-07-12 NOTE — Progress Notes (Signed)
PROGRESS NOTE    Lori [name]  KVQ:[id number] DOB: 02/14/1951 DOA: 06/23/2017 PCP: [name], MD   Chief Complaint  Patient presents with  . 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 She was at a skilled nursing facility. She was recently hospitalized for small bowel obstruction and underwent diagnostic laparoscopy in June. She underwent repair of small bowel perforation. She had a prolonged hospital stay. Subsequently was discharged to skilled nursing facility. Presented back with rectal bleeding. Seen by oncology. Not thought to be a candidate for any treatment. Then she had what appears to be a seizure resulting in encephalopathy. Neurology was consulted. Palliative medicine continues to follow. Family still desires aggressive care. Patient was transferred to stepdown unit. Critical care medicine was consulted. Patient noted to have profuse urine output. 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  Patient improving. Had MBS, started on dysphagia 2 diet however still poor oral intake, therefore NGT still in place. Patient transitioned to oral Keppra, Tegretol. Mental status improving. Neurology felt no need for lumbar puncture as mental status has been improving.  Assessment & Plan   Acute metabolic encephalopathy/Left Thalamic Stroke/ Bilateral small SDH -Patient was noted to have garbled speech on the afternoon of 06/28/2017 -Initial concern for stroke -Neurology consultation appreciated -CT head on 8/1: Evolving encephalomalacia at suspected left thalamic lacunar infarct. Previously identified tiny bilateral subdural hematoma -MRI brain on 7/29: New tiny bilateral subdural hematomas, trace subarachnoid hemorrhage, punctate focus  of diffusion abnormality in the dorsal thalamus -It was thought the patient was having seizure activity and she was loaded with Keppra -Patient has had continuous EEG monitoring: Progressively improving diffuse encephalopathy  -Mental status and speech currently improving -Neurology feels lumbar puncture is no longer necessary given that her mental status is improving -Patient had modified barium swallow, was placed on dysphagia 2 diet  History of seizure disorder -Patient was on carbamazepine at home however this is not initiated at time of admission -Treatment plan as above, on oral Tegretol and Keppra  Diabetes insipidus/polyuria -Patient had excessive urine output thought to be due to central diabetes insipidus due to abnormalities noted on MRI of the brain -Sodium level was also elevated with high serum osmolality 100 -She was given DDAVP -Urine output over the past 24 hours 1700cc -Currently sodim 140  Primary appendiceal carcinoma with metastatic disease, stage IV -Oncology consultation appreciated, patient is not a candidate for chemotherapy and is thought to have an incurable condition. Poor prognosis -Palliative care was consulted. 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 surgery consulted and following  Rectal bleeding of unknown cause -Appears to be stable, patient is not a candidate for endoscopic evaluation due to recent surgery. This was discussed by previous hospitalist with general surgery.  Anemia secondary to acute blood loss -Hemoglobin currently 7.4, appears stable -transfuse if hemoglobin <7 -Continue to monitor CBC  Abdominal fluid collection on CT scan -Previous hospitalist discussed this with interventional radiologist -Patient was on Zosyn however this was discontinued as it could lower seizure threshold. Transition to cefepime and Flagyl. -Patient was on Unasyn secondary to enterococcus urinary tract infection, completed  course.  Diabetes mellitus, type II -Continue insulin sliding scale CBG monitoring  Essential hypertension -Continue clonidine, hydralazine as needed  Enterococcus UTI -As above, patient was on Zosyn  however transitioned to and completed Unasyn   Nutrition -Patient continues to be encephalopathic and had poor oral intake -Speech following, placed on dysphagia diet starting 07/10/2017 -Continue NG tube until in place given that her oral intake has been poor  Hypokalemia/hypomagnesemia -Potassium 3.7, magnesium 1.6 -Will continue to replace with IV magnesium and continue to monitor BMP  Goals of care -Palliative care was consulted and appreciated however family wishes to continue aggressive care  DVT Prophylaxis  SCDs  Code Status: Full  Family Communication: None at bedside  Disposition Plan: Admitted. Pending PT consult. Currently on tube feeds   Consultants PCCM General surgery Neurology  Oncology Palliative care  Procedures  Continuous EEG  Antibiotics   Anti-infectives    Start     Dose/Rate Route Frequency Ordered Stop   07/04/17 1100  Ampicillin-Sulbactam (UNASYN) 3 g in sodium chloride 0.9 % 100 mL IVPB     3 g 200 mL/hr over 30 Minutes Intravenous Every 6 hours 07/04/17 0823 07/10/17 0429   07/03/17 1600  Ampicillin-Sulbactam (UNASYN) 3 g in sodium chloride 0.9 % 100 mL IVPB  Status:  Discontinued     3 g 200 mL/hr over 30 Minutes Intravenous Every 6 hours 07/03/17 1047 07/04/17 0823   06/29/17 1400  ceFEPIme (MAXIPIME) 2 g in dextrose 5 % 50 mL IVPB  Status:  Discontinued     2 g 100 mL/hr over 30 Minutes Intravenous Every 8 hours 06/29/17 0853 06/29/17 0857   06/29/17 1000  ceFEPIme (MAXIPIME) 2 g in dextrose 5 % 50 mL IVPB  Status:  Discontinued     2 g 100 mL/hr over 30 Minutes Intravenous Every 8 hours 06/29/17 0857 07/03/17 1046   06/29/17 0930  metroNIDAZOLE (FLAGYL) IVPB 500 mg  Status:  Discontinued     500 mg 100 mL/hr over 60 Minutes  Intravenous Every 8 hours 06/29/17 0838 06/30/17 0956   06/24/17 0600  piperacillin-tazobactam (ZOSYN) IVPB 3.375 g  Status:  Discontinued     3.375 g 12.5 mL/hr over 240 Minutes Intravenous Every 8 hours 06/23/17 2254 06/29/17 0838   06/23/17 2300  piperacillin-tazobactam (ZOSYN) IVPB 3.375 g     3.375 g 100 mL/hr over 30 Minutes Intravenous  Once 06/23/17 2254 06/24/17 0056      Subjective:   Lori [name] seen and examined today.  Has no complaints today. She denies chest pain, shortness of breath, abdominal pain, nausea, vomiting.  Objective:   Vitals:   07/11/17 2035 07/12/17 0024 07/12/17 0423 07/12/17 1225  BP: 126/76 140/83 (!) 145/79   Pulse: 96 (!) 102 (!) 107 (!) 101  Resp: 16 16 18    Temp: 98.1 F (36.7 C) 98.6 F (37 C) 99 F (37.2 C)   TempSrc: Oral  Oral   SpO2: 99% 99% 98%   Weight:   79 kg (174 lb 1.6 oz)   Height:        Intake/Output Summary (Last 24 hours) at 07/12/17 1302 Last data filed at 07/12/17 1059  Gross per 24 hour  Intake              130 ml  Output             1700 ml  Net            -1570 ml   Filed Weights   07/10/17 0354 07/11/17 0500 07/12/17 0423  Weight: 92.8 kg (204 lb 9.4 oz) 80.5 kg (177 lb 8 oz) 79 kg (174 lb 1.6 oz)  Exam  General: Well developed, well nourished, NAD  HEENT: NCAT, mucous membranes moist. NG tube in place  Cardiovascular: S1 S2 auscultated, RRR, no murmurs  Respiratory: Clear to auscultation bilaterally  Abdomen: Soft, nontender, nondistended, + bowel sounds, dressing in place  Extremities: warm dry without cyanosis clubbing or edema  Neuro: AAOx3, nonfocal (no new deficits)  Psych: Appropriate  Data Reviewed: I have personally reviewed following labs and imaging studies  CBC:  Recent Labs Lab 07/08/17 1158 07/09/17 0412 07/10/17 0403 07/11/17 0342 07/12/17 0417  WBC 10.3 10.0 9.1 9.7 9.2  HGB 7.4* 7.4* 7.3* 7.2* 7.4*  HCT 24.7* 25.2* 24.2* 23.6* 24.3*  MCV 92.5 92.0 92.4 90.8 89.7   PLT 258 271 259 276 235   Basic Metabolic Panel:  Recent Labs Lab 07/07/17 0457 07/08/17 1158 07/09/17 0412 07/10/17 0403 07/11/17 0342 07/12/17 0417  NA 141 146* 143 142 139 140  K 3.3* 4.0 3.6 3.7 3.9 3.7  CL 109 114* 113* 111 107 107  CO2 27 27 26 27 27 27   GLUCOSE 107* 136* 132* 151* 133* 132*  BUN 12 15 14 12 12 11   CREATININE 0.58 0.58 0.55 0.55 0.48 0.44  CALCIUM 7.4* 7.5* 7.7* 7.7* 7.8* 7.3*  MG 1.4*  --   --   --  1.4* 1.6*   GFR: Estimated Creatinine Clearance: 76.3 mL/min (by C-G formula based on SCr of 0.44 mg/dL). Liver Function Tests: No results for input(s): AST, ALT, ALKPHOS, BILITOT, PROT, ALBUMIN in the last 168 hours. No results for input(s): LIPASE, AMYLASE in the last 168 hours. No results for input(s): AMMONIA in the last 168 hours. Coagulation Profile:  Recent Labs Lab 07/05/17 1330  INR 1.62   Cardiac Enzymes: No results for input(s): CKTOTAL, CKMB, CKMBINDEX, TROPONINI in the last 168 hours. BNP (last 3 results) No results for input(s): PROBNP in the last 8760 hours. HbA1C: No results for input(s): HGBA1C in the last 72 hours. CBG:  Recent Labs Lab 07/11/17 2031 07/12/17 0015 07/12/17 0420 07/12/17 0757 07/12/17 1213  GLUCAP 139* 152* 132* 94 122*   Lipid Profile: No results for input(s): CHOL, HDL, LDLCALC, TRIG, CHOLHDL, LDLDIRECT in the last 72 hours. Thyroid Function Tests: No results for input(s): TSH, T4TOTAL, FREET4, T3FREE, THYROIDAB in the last 72 hours. Anemia Panel: No results for input(s): VITAMINB12, FOLATE, FERRITIN, TIBC, IRON, RETICCTPCT in the last 72 hours. Urine analysis:    Component Value Date/Time   COLORURINE STRAW (A) 07/02/2017 1704   APPEARANCEUR CLEAR 07/02/2017 1704   LABSPEC 1.005 07/02/2017 1704   LABSPEC 1.010 08/17/2015 1542   PHURINE 8.0 07/02/2017 1704   GLUCOSEU NEGATIVE 07/02/2017 1704   GLUCOSEU 100 08/17/2015 1542   HGBUR SMALL (A) 07/02/2017 1704   BILIRUBINUR NEGATIVE 07/02/2017  1704   BILIRUBINUR Negative 08/17/2015 1542   KETONESUR NEGATIVE 07/02/2017 1704   PROTEINUR NEGATIVE 07/02/2017 1704   UROBILINOGEN 1.0 08/26/2015 0725   UROBILINOGEN 0.2 08/17/2015 1542   NITRITE NEGATIVE 07/02/2017 1704   LEUKOCYTESUR NEGATIVE 07/02/2017 1704   LEUKOCYTESUR Trace 08/17/2015 1542   Sepsis Labs: @LABRCNTIP (procalcitonin:4,lacticidven:4)  ) Recent Results (from the past 240 hour(s))  MRSA PCR Screening     Status: None   Collection Time: 07/11/17  8:58 AM  Result Value Ref Range Status   MRSA by PCR NEGATIVE NEGATIVE Final    Comment:        The GeneXpert MRSA Assay (FDA approved for NASAL specimens only), is one component of a comprehensive MRSA colonization surveillance program. It  is not intended to diagnose MRSA infection nor to guide or monitor treatment for MRSA infections.       Radiology Studies: No results found.   Scheduled Meds: . carbamazepine  150 mg Oral TID  . cloNIDine  0.1 mg Transdermal Weekly  . feeding supplement (PRO-STAT SUGAR FREE 64)  30 mL Per Tube TID  . folic acid  1 mg Per Tube Daily  . insulin aspart  0-15 Units Subcutaneous Q4H  . levETIRAcetam  1,000 mg Oral BID  . metoprolol tartrate  5 mg Intravenous Q6H  . potassium chloride  40 mEq Oral Daily   Continuous Infusions: . dextrose 5 % with kcl 40 mL/hr at 07/12/17 0650  . feeding supplement (JEVITY 1.2 CAL) 1,000 mL (07/12/17 0429)     LOS: 19 days   Time Spent in minutes   30 minutes  Lori [name] D.O. on 07/12/2017 at 1:02 PM  Between 7am to 7pm - Pager - [phone]  After 7pm go to [email or web address] - password [email or web address]  And look for the night coverage person covering for me after hours  [facility] Office  [phone]

## 2017-07-12 NOTE — Evaluation (Signed)
Physical Therapy Evaluation Patient Details Name: Lori [name] MRN: [id number] DOB: 1951/05/22 Today's Date: 07/12/2017   History of Present Illness  Lori [name] is a very nice 66 year old African-American female. She lives in [place]. She had a sore toward surgery about a month ago. 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 She probably was diagnosed with this a year ago after having an appendectomy. Chemotherapy and radiotherapy.  Non-healed intra-abdominal abcess with VAC in place as complication after prior surgeries. Patient to have garbled speech on 7/26 work up revealed Acute metabolic encephalopathy/Left Thalamic Stroke/ Bilateral small SDH.   Clinical Impression  Orders received for PT RE-evaluation. Patient demonstrates continued deficits in functional mobility as indicated below. Will benefit from continued skilled PT to address deficits and maximize function. Will see as indicated and progress as tolerated. Previous goals remain appropriate.  OF NOTE: Session limited to in bed assessment and EOB activity. Patient noted to be saturated in tube feeds when assist to EOB. Nsg notified.      Follow Up Recommendations SNF;Supervision/Assistance - 24 hour    Equipment Recommendations  Wheelchair (measurements PT)    Recommendations for Other Services       Precautions / Restrictions Precautions Precautions: Fall Precaution Comments: rectal pouch, foley Restrictions Weight Bearing Restrictions: No      Mobility  Bed Mobility Overal bed mobility: Needs Assistance Bed Mobility: Rolling;Supine to Sit;Sit to Supine Rolling: Mod assist Sidelying to sit: Total assist;HOB elevated     Sit to sidelying: Total assist;+2 for physical assistance;+2 for safety/equipment General bed mobility comments: max assist to bring LEs to EOB, use of chuck pad to pull hips toward EOB then utilized mechanical feature of bed to elevate HOB to assist to  halfway position, total assist to complete trunk elevation to upright at EOB. Upon reaching upright patient noted to be saturated in tube feeds, assisted patient back to bed for hygiene and care. Total assist to return to supine, use of chuck pad and bed sheet to reposition  Transfers                 General transfer comment: unable to attempt this session  Ambulation/Gait             General Gait Details: unable  Stairs            Wheelchair Mobility    Modified Rankin (Stroke Patients Only)       Balance Overall balance assessment: Needs assistance Sitting-balance support: Bilateral upper extremity supported;Feet supported Sitting balance-Lori [name]: Poor Sitting balance - Comments: EOB ~1 minutes Postural control: Posterior lean                                   Pertinent Vitals/Pain Pain Assessment: No/denies pain Faces Pain [name]: No hurt Pain Location: denies pain    Home Living Family/patient expects to be discharged to:: Skilled nursing facility                      Prior Function Level of Independence: Independent with assistive device(s)   Gait / Transfers Assistance Needed: parallel bars at [facility] per pt  ADL's / Homemaking Assistance Needed: Assist by nursing        Hand Dominance        Extremity/Trunk Assessment        Lower Extremity Assessment Lower Extremity Assessment: Generalized weakness RLE Deficits / Details: gross  motions 2-/5 bilateral LEs, sensation intact per patient LLE Deficits / Details: gross motions 2-/5 bilateral LEs, sensation intact per patient       Communication      Cognition Arousal/Alertness: Awake/alert Behavior During Therapy: Flat affect Overall Cognitive Status: Impaired/Different from baseline Area of Impairment: Problem solving                       Following Commands: Follows one step commands with increased time     Problem Solving: Slow  processing;Requires verbal cues;Requires tactile cues General Comments: patient pleasent and interactive but flat throughout session.       General Comments      Exercises     Assessment/Plan    PT Assessment Patient needs continued PT services  PT Problem List Decreased strength;Decreased range of motion;Decreased activity tolerance;Decreased balance;Decreased mobility;Decreased knowledge of precautions;Decreased safety awareness;Decreased knowledge of use of DME;Pain       PT Treatment Interventions Functional mobility training;Therapeutic activities;Therapeutic exercise;Patient/family education;Wheelchair mobility training    PT Goals (Current goals can be found in the Care Plan section)  Acute Rehab PT Goals Patient Stated Goal: to go back to Rehab and continue to work in parallel bars PT Goal Formulation: With patient Time For Goal Achievement: 07/23/17 Potential to Achieve Goals: Fair    Frequency Min 2X/week   Barriers to discharge        Co-evaluation               AM-PAC PT "6 Clicks" Daily Activity  Outcome Measure Difficulty turning over in bed (including adjusting bedclothes, sheets and blankets)?: Total Difficulty moving from lying on back to sitting on the side of the bed? : Total Difficulty sitting down on and standing up from a chair with arms (e.g., wheelchair, bedside commode, etc,.)?: Total Help needed moving to and from a bed to chair (including a wheelchair)?: Total Help needed walking in hospital room?: Total Help needed climbing 3-5 steps with a railing? : Total 6 Click Score: 6    End of Session Equipment Utilized During Treatment: Gait belt Activity Tolerance: Patient limited by fatigue;Patient limited by pain Patient left: in bed;with call bell/phone within reach;with nursing/sitter in room Nurse Communication: Mobility status;Need for lift equipment (tube feed leaking) PT Visit Diagnosis: Unsteadiness on feet (R26.81);Muscle weakness  (generalized) (M62.81);Difficulty in walking, not elsewhere classified (R26.2)    Time: 6761-9509 PT Time Calculation (min) (ACUTE ONLY): 24 min   Charges:   PT Evaluation $PT Re-evaluation: 1 Re-eval PT Treatments $Therapeutic Activity: 8-22 mins   PT G Codes:        Alben Deeds, PT DPT  Board Certified Neurologic Specialist [phone]   Duncan Dull 07/12/2017, 1:44 PM

## 2017-07-12 NOTE — Care Management Important Message (Signed)
Important Message  Patient Details  Name: Lori [name] MRN: [id number] Date of Birth: 1951-03-19   Medicare Important Message Given:  Yes    [name] May 07/12/2017, 9:57 AM

## 2017-07-12 NOTE — Progress Notes (Signed)
  Speech Language Pathology Treatment: Dysphagia  Patient Details Name: Lori [name] MRN: [id number] DOB: 10-10-1951 Today's Date: 07/12/2017 Time: 4270-6237 SLP Time Calculation (min) (ACUTE ONLY): 18 min  Assessment / Plan / Recommendation Clinical Impression  Dysphagia treatment provided for diet tolerance/ trials of advanced liquids and solids. Pt consumed small sips of thin liquid without overt s/s of aspiration, no noticeable change in vocal quality. Pt followed verbal cues consistently to take small sips at a time/ one sip at a time. Trialed regular solid consistency; pt continues to have a significantly prolonged oral phase (~1 minute) with solid consistency and required several sips of thin liquid to swallow. Pt continues to be at an increased risk of aspiration given cognitive status (still unaware of deficits/ safety precautions) and previous MBS showing silent aspiration when sipping from straw. Recommend continuing dysphagia 2 consistency but advancing liquids back to thin (no straws), small sips at a time with full supervision. Will continue to follow for diet tolerance/ consider advancement of solids.   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 She was recently hospitalized for small bowel obstruction and underwent diagnostic laparoscopy in June. She underwent repair of small bowel perforation. She had a prolonged hospital stay. Subsequently was discharged to skilled nursing facility. Presented back with rectal bleeding. Seen by oncology. Not thought to be a candidate for any treatment. Then she had what appeared to be a seizure resulting in encephalopathy. Neurology was consulted; pt with acute left thalamic CVA, bilateral small SDH. Initially evaluated by  SLP 06/29/17 with recommendations for dys 2, thin liquids, however pt made NPO with cortrak placed per MD with change in MS, SLP s/o on 7/31 as pt remained unresponsive.       SLP Plan  Continue with current plan of care       Recommendations  Diet recommendations: Dysphagia 2 (fine chop);Thin liquid Liquids provided via: Cup;No straw Medication Administration: Whole meds with puree Supervision: Patient able to self feed;Full supervision/cueing for compensatory strategies Compensations: Minimize environmental distractions;Slow rate;Small sips/bites Postural Changes and/or Swallow Maneuvers: Seated upright 90 degrees                Oral Care Recommendations: Oral care BID Follow up Recommendations: Skilled Nursing facility SLP Visit Diagnosis: Dysphagia, oropharyngeal phase (R13.12) Plan: Continue with current plan of care       [place], [place], [place] 07/12/2017, 1:42 PM  [phone]

## 2017-07-12 NOTE — Progress Notes (Signed)
Nutrition Follow-up  DOCUMENTATION CODES:   Obesity unspecified  INTERVENTION:   Continue Jevity 1.2 @ 55 ml/hr via cortrak tube  30 ml Prostat TID.    Tube feeding regimen provides 1704 kcal (100% of needs), 118 grams of protein, and 1065 ml of H2O.   NUTRITION DIAGNOSIS:   Inadequate oral intake related to lethargy/confusion, poor appetite as evidenced by meal completion < 25%.  Progressing  GOAL:   Patient will meet greater than or equal to 90% of their needs  Met with TF  MONITOR:   PO intake, Diet advancement, Labs, TF tolerance, Skin, I & O's  REASON FOR ASSESSMENT:   Consult Assessment of nutrition requirement/status  ASSESSMENT:   Lori [name] is a 66 y.o. female with medical history significant of DM2, Seizures (not on medication), HTN, HLD, GERD, cervical and appendiceal carcinoma, intracranial aneurysm repair in 1997 who presents for rectal bleeding.  Pt lying in bed at time of visit. Pt was minimally interactive, however, pt reported feeling well and denied abdominal pain, nausea, and vomiting.   Pt remains on a dysphagia 2 diet with nectar thick liquids. Pt does not like food consistencies of diet and intake is minimal. Noted Po: 0% per doc flowsheets.   Pt remains TF dependent. Jevity 1.2 infusing via cortrak tube (with 30 ml Prostat TID). Regimen providing 1740 kcals, 118 gm protein, 1065 ml of free water, meeting 100% of estimated kcal and protein needs.   Labs reviewed: CBGS: 94-152, Mg: 1.6 (on IV supplementation).  Diet Order:  DIET DYS 2 Room service appropriate? Yes; Fluid consistency: Thin  Skin:   (closed abdominal incision)  Last BM:  07/12/17  Height:   Ht Readings from Last 1 Encounters:  07/01/17 5' 8"  (1.727 m)    Weight:   Wt Readings from Last 1 Encounters:  07/12/17 174 lb 1.6 oz (79 kg)    Ideal Body Weight:  65.9 kg  BMI:  Body mass index is 26.47 kg/m.  Estimated Nutritional Needs:   Kcal:   1800-2000  Protein:  105-120 grams  Fluid:  > 1.8 L  EDUCATION NEEDS:   Education needs no appropriate at this time  Khamya Topp A. Jimmye Norman, RD, LDN, CDE Pager: [phone] After hours Pager: [phone]

## 2017-07-13 LAB — BASIC METABOLIC PANEL
Anion gap: 6 (ref 5–15)
BUN: 11 mg/dL (ref 6–20)
CO2: 29 mmol/L (ref 22–32)
CREATININE: 0.45 mg/dL (ref 0.44–1.00)
Calcium: 8 mg/dL — ABNORMAL LOW (ref 8.9–10.3)
Chloride: 103 mmol/L (ref 101–111)
GFR calc Af Amer: >60 mL/min (ref >60)
Glucose, Bld: 152 mg/dL — ABNORMAL HIGH (ref 65–99)
Potassium: 4.1 mmol/L (ref 3.5–5.1)
SODIUM: 138 mmol/L (ref 135–145)

## 2017-07-13 LAB — GLUCOSE, CAPILLARY
GLUCOSE-CAPILLARY: 147 mg/dL — AB (ref 65–99)
GLUCOSE-CAPILLARY: 143 mg/dL — AB (ref 65–99)
GLUCOSE-CAPILLARY: 105 mg/dL — AB (ref 65–99)
Glucose-Capillary: 109 mg/dL — ABNORMAL HIGH (ref 65–99)
Glucose-Capillary: 147 mg/dL — ABNORMAL HIGH (ref 65–99)
Glucose-Capillary: 97 mg/dL (ref 65–99)

## 2017-07-13 LAB — CBC
HCT: 24.3 % — ABNORMAL LOW (ref 36.0–46.0)
Hemoglobin: 7.4 g/dL — ABNORMAL LOW (ref 12.0–15.0)
MCH: 27.6 pg (ref 26.0–34.0)
MCHC: 30.5 g/dL (ref 30.0–36.0)
MCV: 90.7 fL (ref 78.0–100.0)
PLATELETS: 294 10*3/uL (ref 150–400)
RBC: 2.68 MIL/uL — ABNORMAL LOW (ref 3.87–5.11)
RDW: 18.3 % — AB (ref 11.5–15.5)
WBC: 11 10*3/uL — ABNORMAL HIGH (ref 4.0–10.5)

## 2017-07-13 LAB — MAGNESIUM: MAGNESIUM: 1.6 mg/dL — AB (ref 1.7–2.4)

## 2017-07-13 MED ORDER — JEVITY 1.2 CAL PO LIQD
1000.0000 mL | ORAL | Status: DC
  Filled 2017-07-13 (×7): qty 1000

## 2017-07-13 MED ORDER — MAGNESIUM SULFATE 2 GM/50ML IV SOLN
2.0000 g | Freq: Once | INTRAVENOUS | Status: AC
  Administered 2017-07-13: 2 g via INTRAVENOUS
  Filled 2017-07-13: qty 50

## 2017-07-13 NOTE — Progress Notes (Signed)
  Speech Language Pathology Treatment: Dysphagia  Patient Details Name: Lori [name] MRN: [id number] DOB: 24-Jan-1951 Today's Date: 07/13/2017 Time: 1345-1410 SLP Time Calculation (min) (ACUTE ONLY): 25 min  Assessment / Plan / Recommendation Clinical Impression  Skilled observation of intake of thin liquids via cup/no straw with refusal for puree/solids during this session; pt given minimal verbal cueing to take small sips and clear throat intermittently during intake; extensive education with family re: tube feeding and continuous feeds that decrease satiety as well as aspiration precautions and swallowing strategies implemented during meals; discussed possible decrease in TF to increase satiety and appetite to determine if diet can be upgraded with ST; nursing informed SLP TF would decrease for 24 hrs to determine if this assisted in PO intake and possible repeat MBS prn.  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 She was recently hospitalized for small bowel obstruction and underwent diagnostic laparoscopy in June. She underwent repair of small bowel perforation. She had a prolonged hospital stay. Subsequently was discharged to skilled nursing facility. Presented back with rectal bleeding. Seen by oncology. Not thought to be a candidate for any treatment. Then she had what appeared to be a seizure resulting in encephalopathy. Neurology was consulted; pt with acute left thalamic CVA, bilateral small SDH. Initially evaluated by SLP 06/29/17 with recommendations for dys 2, thin liquids, however pt made NPO with cortrak placed per MD with change in MS, SLP s/o on 7/31 as pt remained unresponsive.       SLP Plan  Continue with current plan of care        Recommendations  Diet recommendations: Dysphagia 2 (fine chop);Thin liquid Liquids provided via: Cup;No straw Medication Administration: Whole meds with liquid Supervision: Patient able to self feed;Intermittent supervision to cue for compensatory strategies Compensations: Minimize environmental distractions;Slow rate;Small sips/bites;Clear throat intermittently Postural Changes and/or Swallow Maneuvers: Seated upright 90 degrees                Oral Care Recommendations: Oral care BID Follow up Recommendations: Skilled Nursing facility SLP Visit Diagnosis: Dysphagia, oropharyngeal phase (R13.12) Plan: Continue with current plan of care                      Elvina Sidle, M.S., CCC-SLP 07/13/2017, 4:23 PM

## 2017-07-13 NOTE — Progress Notes (Signed)
Patient ID: Lori [name], female   DOB: May 22, 1951, 66 y.o.   MRN: [id number]  [facility] Surgery Progress Note     Subjective: CC- abdominal wound Patient sitting up in bed watching television. No complaints. Denies abdominal pain, nausea, or vomiting. Tolerating TF. States that she does not have much of an appetite. Good bowel function.  Objective: Vital signs in last 24 hours: Temp:  [98 F (36.7 C)-98.7 F (37.1 C)] 98.2 F (36.8 C) (08/10 0450) Pulse Rate:  [94-101] 98 (08/10 0450) Resp:  [18-19] 18 (08/10 0450) BP: (135-158)/(81-83) 150/82 (08/10 0450) SpO2:  [98 %-99 %] 99 % (08/10 0450) Weight:  [202 lb 6.4 oz (91.8 kg)] 202 lb 6.4 oz (91.8 kg) (08/10 0450) Last BM Date:  (flexicele)  Intake/Output from previous day: 08/09 0701 - 08/10 0700 In: 2512.7 [P.O.:240; I.V.:962.7; NG/GT:1260; IV Piggyback:50] Out: 350 [Urine:350] Intake/Output this shift: Total I/O In: -  Out: 1000 [Stool:1000]  PE: Gen: Alert, NAD HEENT: pupils equal and round Card: RRR, no M/G/R heard Pulm: CTAB, no W/R/R, effort normal Abd: Soft, nontender, nondistended +BS     Lab Results:   Recent Labs  07/12/17 0417 07/13/17 0430  WBC 9.2 11.0*  HGB 7.4* 7.4*  HCT 24.3* 24.3*  PLT 248 294   BMET  Recent Labs  07/12/17 0417 07/13/17 0430  NA 140 138  K 3.7 4.1  CL 107 103  CO2 27 29  GLUCOSE 132* 152*  BUN 11 11  CREATININE 0.44 0.45  CALCIUM 7.3* 8.0*   PT/INR No results for input(s): LABPROT, INR in the last 72 hours. CMP     Component Value Date/Time   NA 138 07/13/2017 0430   K 4.1 07/13/2017 0430   CL 103 07/13/2017 0430   CO2 29 07/13/2017 0430   GLUCOSE 152 (H) 07/13/2017 0430   BUN 11 07/13/2017 0430   CREATININE 0.45 07/13/2017 0430   CALCIUM 8.0 (L) 07/13/2017 0430   PROT 5.8 (L) 06/30/2017 0439   ALBUMIN 1.4 (L) 06/30/2017 0439   AST 12 (L) 06/30/2017 0439   ALT 8 (L) 06/30/2017 0439   ALKPHOS 80 06/30/2017 0439   BILITOT 0.3 06/30/2017  0439   GFRNONAA >60 07/13/2017 0430   GFRAA >60 07/13/2017 0430   Lipase     Component Value Date/Time   LIPASE 31 05/14/2017 2106       Studies/Results: No results found.  Anti-infectives: Anti-infectives    Start     Dose/Rate Route Frequency Ordered Stop   07/04/17 1100  Ampicillin-Sulbactam (UNASYN) 3 g in sodium chloride 0.9 % 100 mL IVPB     3 g 200 mL/hr over 30 Minutes Intravenous Every 6 hours 07/04/17 0823 07/10/17 0429   07/03/17 1600  Ampicillin-Sulbactam (UNASYN) 3 g in sodium chloride 0.9 % 100 mL IVPB  Status:  Discontinued     3 g 200 mL/hr over 30 Minutes Intravenous Every 6 hours 07/03/17 1047 07/04/17 0823   06/29/17 1400  ceFEPIme (MAXIPIME) 2 g in dextrose 5 % 50 mL IVPB  Status:  Discontinued     2 g 100 mL/hr over 30 Minutes Intravenous Every 8 hours 06/29/17 0853 06/29/17 0857   06/29/17 1000  ceFEPIme (MAXIPIME) 2 g in dextrose 5 % 50 mL IVPB  Status:  Discontinued     2 g 100 mL/hr over 30 Minutes Intravenous Every 8 hours 06/29/17 0857 07/03/17 1046   06/29/17 0930  metroNIDAZOLE (FLAGYL) IVPB 500 mg  Status:  Discontinued  500 mg 100 mL/hr over 60 Minutes Intravenous Every 8 hours 06/29/17 0838 06/30/17 0956   06/24/17 0600  piperacillin-tazobactam (ZOSYN) IVPB 3.375 g  Status:  Discontinued     3.375 g 12.5 mL/hr over 240 Minutes Intravenous Every 8 hours 06/23/17 2254 06/29/17 0838   06/23/17 2300  piperacillin-tazobactam (ZOSYN) IVPB 3.375 g     3.375 g 100 mL/hr over 30 Minutes Intravenous  Once 06/23/17 2254 06/24/17 0056       Assessment/Plan Diabetes insipidus Seizure disorder Lower GI bleed - resolved Anemia - Hg 7.4, stable DM-II HTN Enterococcus UTI - completed unasyn Acute metabolic encephalopathy/Left Thalamic Stroke/ Bilateral small SDH Malnutrition - started on dysphagia diet 8/7, but due to poor PO intake TF is being continued  Primary appendiceal carcinoma with metastatic disease, stage IV  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 [name]: 1. 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  2. Exploratory laparotomy, drainage of abdominal abscess, repair of small bowel perforation, repair of small bowel enterotomy, enterocolostomy (mid ileum to mid transverse colon). - tolerating TF and having bowel function  FEN- DYS II, TF VTE- SCDs, No anticoagulants due to anemia ID- Zosyn 7/21>>7/27, Flagyl 7/27>>7/28, Cefipime 7/27>>7/27, Unasyn 7/31>>8/7  Plan: TID wet to dry dressing changes to abdominal wound.   LOS: 20 days    Wellington Hampshire , [facility] Surgery 07/13/2017, 8:32 AM Pager: [phone] Consults: [phone] Mon-Fri 7:00 am-4:30 pm Sat-Sun 7:00 am-11:30 am

## 2017-07-13 NOTE — Progress Notes (Signed)
PROGRESS NOTE    Lori [name]  OEV:[id number] DOB: Apr 10, 1951 DOA: 06/23/2017 PCP: [name], MD   Chief Complaint  Patient presents with  . 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 She was at a skilled nursing facility. She was recently hospitalized for small bowel obstruction and underwent diagnostic laparoscopy in June. She underwent repair of small bowel perforation. She had a prolonged hospital stay. Subsequently was discharged to skilled nursing facility. Presented back with rectal bleeding. Seen by oncology. Not thought to be a candidate for any treatment. Then she had what appears to be a seizure resulting in encephalopathy. Neurology was consulted. Palliative medicine continues to follow. Family still desires aggressive care. Patient was transferred to stepdown unit. Critical care medicine was consulted. Patient noted to have profuse urine output. 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  Patient improving. Had MBS, started on dysphagia 2 diet however still poor oral intake, therefore NGT still in place. Patient transitioned to oral Keppra, Tegretol. Mental status improving. Neurology felt no need for lumbar puncture as mental status has been improving.  Assessment & Plan   Acute metabolic encephalopathy/Left Thalamic Stroke/ Bilateral small SDH -Patient was noted to have garbled speech on the afternoon of 06/28/2017 -Initial concern for stroke -Neurology consultation appreciated -CT head on 8/1: Evolving encephalomalacia at suspected left thalamic lacunar infarct. Previously identified tiny bilateral subdural hematoma -MRI brain on 7/29: New tiny bilateral subdural hematomas, trace subarachnoid hemorrhage, punctate focus  of diffusion abnormality in the dorsal thalamus -It was thought the patient was having seizure activity and she was loaded with Keppra -Patient has had continuous EEG monitoring: Progressively improving diffuse encephalopathy  -Neurology feels lumbar puncture is no longer necessary given that her mental status is improving -Patient had modified barium swallow, was placed on dysphagia 2 diet -Mental status improving   History of seizure disorder -Patient was on carbamazepine at home however this is not initiated at time of admission -Treatment plan as above, on oral Tegretol and Keppra  Diabetes insipidus/polyuria -Patient had excessive urine output thought to be due to central diabetes insipidus due to abnormalities noted on MRI of the brain -Sodium level was also elevated with high serum osmolality 100 -She was given DDAVP -Urine output over the past 24 hours 1000cc -Currently sodim 138  Primary appendiceal carcinoma with metastatic disease, stage IV with abdominal wound -Oncology consultation appreciated, patient is not a candidate for chemotherapy and is thought to have an incurable condition. Poor prognosis -Palliative care was consulted. 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 surgery consulted and following- recommended TID wet to dry dressing changes  Rectal bleeding of unknown cause -Appears to be stable, patient is not a candidate for endoscopic evaluation due to recent surgery. This was discussed by previous hospitalist with general surgery.  Anemia secondary to acute blood loss -Hemoglobin currently 7.4, appears stable -transfuse if hemoglobin <7 -Continue to monitor CBC  Abdominal fluid collection on CT scan -Previous hospitalist discussed this with interventional radiologist -Patient was on Zosyn however this was discontinued as it could lower seizure threshold. Transition to cefepime and Flagyl. -Patient was on Unasyn secondary to enterococcus  urinary tract infection, completed course.  Diabetes mellitus, type II -Continue insulin sliding scale CBG monitoring  Essential hypertension -Continue clonidine, hydralazine as needed  Enterococcus UTI -As above, patient was on Zosyn however transitioned to and completed Unasyn   Nutrition -Patient continues to be encephalopathic and had poor oral intake -Speech following, placed on dysphagia diet starting 07/10/2017 -Continue NG tube until in place given that her oral intake has been poor  Hypokalemia/hypomagnesemia -Potassium 4.1, magnesium 1.6 -Replace magnesium with IV supplementation, and continue to monitor   Goals of care -Palliative care was consulted and appreciated however family wishes to continue aggressive care  DVT Prophylaxis  SCDs  Code Status: Full  Family Communication: None at bedside  Disposition Plan: Admitted. Pending weaning and removal of tube feeds/NG tube. Needs SNF  Consultants PCCM General surgery Neurology  Oncology Palliative care  Procedures  Continuous EEG  Antibiotics   Anti-infectives    Start     Dose/Rate Route Frequency Ordered Stop   07/04/17 1100  Ampicillin-Sulbactam (UNASYN) 3 g in sodium chloride 0.9 % 100 mL IVPB     3 g 200 mL/hr over 30 Minutes Intravenous Every 6 hours 07/04/17 0823 07/10/17 0429   07/03/17 1600  Ampicillin-Sulbactam (UNASYN) 3 g in sodium chloride 0.9 % 100 mL IVPB  Status:  Discontinued     3 g 200 mL/hr over 30 Minutes Intravenous Every 6 hours 07/03/17 1047 07/04/17 0823   06/29/17 1400  ceFEPIme (MAXIPIME) 2 g in dextrose 5 % 50 mL IVPB  Status:  Discontinued     2 g 100 mL/hr over 30 Minutes Intravenous Every 8 hours 06/29/17 0853 06/29/17 0857   06/29/17 1000  ceFEPIme (MAXIPIME) 2 g in dextrose 5 % 50 mL IVPB  Status:  Discontinued     2 g 100 mL/hr over 30 Minutes Intravenous Every 8 hours 06/29/17 0857 07/03/17 1046   06/29/17 0930  metroNIDAZOLE (FLAGYL) IVPB 500 mg  Status:   Discontinued     500 mg 100 mL/hr over 60 Minutes Intravenous Every 8 hours 06/29/17 0838 06/30/17 0956   06/24/17 0600  piperacillin-tazobactam (ZOSYN) IVPB 3.375 g  Status:  Discontinued     3.375 g 12.5 mL/hr over 240 Minutes Intravenous Every 8 hours 06/23/17 2254 06/29/17 0838   06/23/17 2300  piperacillin-tazobactam (ZOSYN) IVPB 3.375 g     3.375 g 100 mL/hr over 30 Minutes Intravenous  Once 06/23/17 2254 06/24/17 0056      Subjective:   [name] seen and examined today.  Has no complaints today. Denies Chest pain, shortness of breath, abdominal pain.   Per RN, patient has had poor oral intake and simply does not want to eat.   Objective:   Vitals:   07/12/17 1407 07/12/17 2102 07/13/17 0450 07/13/17 1335  BP: (!) 158/81 135/83 (!) 150/82 (!) 143/78  Pulse: 94 100 98 90  Resp: 19 18 18 14   Temp: 98 F (36.7 C) 98.7 F (37.1 C) 98.2 F (36.8 C) 98.4 F (36.9 C)  TempSrc:  Oral Oral Oral  SpO2: 99% 98% 99% 100%  Weight:   91.8 kg (202 lb 6.4 oz)   Height:        Intake/Output Summary (Last 24 hours) at 07/13/17 1357 Last data filed at 07/13/17 1016  Gross per 24 hour  Intake          2612.67 ml  Output             1350 ml  Net          1262.67 ml   Filed Weights   07/11/17 0500 07/12/17 0423 07/13/17 0450  Weight: 80.5  kg (177 lb 8 oz) 79 kg (174 lb 1.6 oz) 91.8 kg (202 lb 6.4 oz)   Exam  General: Well developed, well nourished, NAD  HEENT: NCAT, mucous membranes moist. NG tube   Cardiovascular: S1 S2 auscultated, no rubs, murmurs or gallops. Regular rate and rhythm.  Respiratory: Clear to auscultation bilaterally with equal chest rise  Abdomen: Soft, nontender, nondistended, + bowel sounds, dressing in place  Extremities: warm dry without cyanosis clubbing or edema  Neuro: AAOx3, nonfocal   Psych: Appropriate  Data Reviewed: I have personally reviewed following labs and imaging studies  CBC:  Recent Labs Lab 07/09/17 0412  07/10/17 0403 07/11/17 0342 07/12/17 0417 07/13/17 0430  WBC 10.0 9.1 9.7 9.2 11.0*  HGB 7.4* 7.3* 7.2* 7.4* 7.4*  HCT 25.2* 24.2* 23.6* 24.3* 24.3*  MCV 92.0 92.4 90.8 89.7 90.7  PLT 271 259 276 248 672   Basic Metabolic Panel:  Recent Labs Lab 07/07/17 0457  07/09/17 0412 07/10/17 0403 07/11/17 0342 07/12/17 0417 07/13/17 0430  NA 141  < > 143 142 139 140 138  K 3.3*  < > 3.6 3.7 3.9 3.7 4.1  CL 109  < > 113* 111 107 107 103  CO2 27  < > 26 27 27 27 29   GLUCOSE 107*  < > 132* 151* 133* 132* 152*  BUN 12  < > 14 12 12 11 11   CREATININE 0.58  < > 0.55 0.55 0.48 0.44 0.45  CALCIUM 7.4*  < > 7.7* 7.7* 7.8* 7.3* 8.0*  MG 1.4*  --   --   --  1.4* 1.6* 1.6*  < > = values in this interval not displayed. GFR: Estimated Creatinine Clearance: 82 mL/min (by C-G formula based on SCr of 0.45 mg/dL). Liver Function Tests: No results for input(s): AST, ALT, ALKPHOS, BILITOT, PROT, ALBUMIN in the last 168 hours. No results for input(s): LIPASE, AMYLASE in the last 168 hours. No results for input(s): AMMONIA in the last 168 hours. Coagulation Profile: No results for input(s): INR, PROTIME in the last 168 hours. Cardiac Enzymes: No results for input(s): CKTOTAL, CKMB, CKMBINDEX, TROPONINI in the last 168 hours. BNP (last 3 results) No results for input(s): PROBNP in the last 8760 hours. HbA1C: No results for input(s): HGBA1C in the last 72 hours. CBG:  Recent Labs Lab 07/12/17 2059 07/12/17 2328 07/13/17 0446 07/13/17 0735 07/13/17 1150  GLUCAP 155* 127* 147* 147* 143*   Lipid Profile: No results for input(s): CHOL, HDL, LDLCALC, TRIG, CHOLHDL, LDLDIRECT in the last 72 hours. Thyroid Function Tests: No results for input(s): TSH, T4TOTAL, FREET4, T3FREE, THYROIDAB in the last 72 hours. Anemia Panel: No results for input(s): VITAMINB12, FOLATE, FERRITIN, TIBC, IRON, RETICCTPCT in the last 72 hours. Urine analysis:    Component Value Date/Time   COLORURINE STRAW (A)  07/02/2017 1704   APPEARANCEUR CLEAR 07/02/2017 1704   LABSPEC 1.005 07/02/2017 1704   LABSPEC 1.010 08/17/2015 1542   PHURINE 8.0 07/02/2017 1704   GLUCOSEU NEGATIVE 07/02/2017 1704   GLUCOSEU 100 08/17/2015 1542   HGBUR SMALL (A) 07/02/2017 1704   BILIRUBINUR NEGATIVE 07/02/2017 1704   BILIRUBINUR Negative 08/17/2015 1542   KETONESUR NEGATIVE 07/02/2017 1704   PROTEINUR NEGATIVE 07/02/2017 1704   UROBILINOGEN 1.0 08/26/2015 0725   UROBILINOGEN 0.2 08/17/2015 1542   NITRITE NEGATIVE 07/02/2017 1704   LEUKOCYTESUR NEGATIVE 07/02/2017 1704   LEUKOCYTESUR Trace 08/17/2015 1542   Sepsis Labs: @LABRCNTIP (procalcitonin:4,lacticidven:4)  ) Recent Results (from the past 240 hour(s))  MRSA PCR Screening  Status: None   Collection Time: 07/11/17  8:58 AM  Result Value Ref Range Status   MRSA by PCR NEGATIVE NEGATIVE Final    Comment:        The GeneXpert MRSA Assay (FDA approved for NASAL specimens only), is one component of a comprehensive MRSA colonization surveillance program. It is not intended to diagnose MRSA infection nor to guide or monitor treatment for MRSA infections.       Radiology Studies: No results found.   Scheduled Meds: . carbamazepine  150 mg Oral TID  . cloNIDine  0.1 mg Transdermal Weekly  . feeding supplement (PRO-STAT SUGAR FREE 64)  30 mL Per Tube TID  . folic acid  1 mg Per Tube Daily  . insulin aspart  0-15 Units Subcutaneous Q4H  . levETIRAcetam  1,000 mg Oral BID  . metoprolol tartrate  5 mg Intravenous Q6H  . potassium chloride  40 mEq Oral Daily   Continuous Infusions: . dextrose 5 % with kcl 40 mL/hr at 07/13/17 0639  . feeding supplement (JEVITY 1.2 CAL) 1,000 mL (07/13/17 1208)     LOS: 20 days   Time Spent in minutes   30 minutes  Robbi Scurlock D.O. on 07/13/2017 at 1:57 PM  Between 7am to 7pm - Pager - [phone]  After 7pm go to [email or web address] - password [email or web address]  And look for the night coverage person covering for me  after hours  [facility] Office  [phone]

## 2017-07-13 NOTE — Progress Notes (Signed)
Patient has poor intake; MD notified and per her recommendation will hold feeding tube for 24 hrs. Will continue to monitor.

## 2017-07-14 LAB — GLUCOSE, CAPILLARY
GLUCOSE-CAPILLARY: 107 mg/dL — AB (ref 65–99)
GLUCOSE-CAPILLARY: 99 mg/dL (ref 65–99)
GLUCOSE-CAPILLARY: 96 mg/dL (ref 65–99)
Glucose-Capillary: 98 mg/dL (ref 65–99)
Glucose-Capillary: 109 mg/dL — ABNORMAL HIGH (ref 65–99)

## 2017-07-14 LAB — BASIC METABOLIC PANEL
Anion gap: 7 (ref 5–15)
BUN: 13 mg/dL (ref 6–20)
CALCIUM: 7.9 mg/dL — AB (ref 8.9–10.3)
CHLORIDE: 103 mmol/L (ref 101–111)
CO2: 26 mmol/L (ref 22–32)
CREATININE: 0.43 mg/dL — AB (ref 0.44–1.00)
Glucose, Bld: 115 mg/dL — ABNORMAL HIGH (ref 65–99)
Potassium: 4.1 mmol/L (ref 3.5–5.1)
SODIUM: 136 mmol/L (ref 135–145)

## 2017-07-14 LAB — CBC
HCT: 23.1 % — ABNORMAL LOW (ref 36.0–46.0)
HEMOGLOBIN: 7.1 g/dL — AB (ref 12.0–15.0)
MCH: 27.5 pg (ref 26.0–34.0)
MCHC: 30.7 g/dL (ref 30.0–36.0)
MCV: 89.5 fL (ref 78.0–100.0)
PLATELETS: 281 10*3/uL (ref 150–400)
RBC: 2.58 MIL/uL — ABNORMAL LOW (ref 3.87–5.11)
RDW: 18.1 % — ABNORMAL HIGH (ref 11.5–15.5)
WBC: 9.4 10*3/uL (ref 4.0–10.5)

## 2017-07-14 NOTE — Progress Notes (Signed)
PROGRESS NOTE    Lori [name]  YFV:[id number] DOB: 1951/04/15 DOA: 06/23/2017 PCP: [name], MD   Chief Complaint  Patient presents with  . 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 She was at a skilled nursing facility. She was recently hospitalized for small bowel obstruction and underwent diagnostic laparoscopy in June. She underwent repair of small bowel perforation. She had a prolonged hospital stay. Subsequently was discharged to skilled nursing facility. Presented back with rectal bleeding. Seen by oncology. Not thought to be a candidate for any treatment. Then she had what appears to be a seizure resulting in encephalopathy. Neurology was consulted. Palliative medicine continues to follow. Family still desires aggressive care. Patient was transferred to stepdown unit. Critical care medicine was consulted. Patient noted to have profuse urine output. 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  Patient improving. Had MBS, started on dysphagia 2 diet however still poor oral intake, therefore NGT still in place. Patient transitioned to oral Keppra, Tegretol. Mental status improving. Neurology felt no need for lumbar puncture as mental status has been improving.  Assessment & Plan   Acute metabolic encephalopathy/Left Thalamic Stroke/ Bilateral small SDH -Patient was noted to have garbled speech on the afternoon of 06/28/2017 -Initial concern for stroke -Neurology consultation appreciated -CT head on 8/1: Evolving encephalomalacia at suspected left thalamic lacunar infarct. Previously identified tiny bilateral subdural hematoma -MRI brain on 7/29: New tiny bilateral subdural hematomas, trace subarachnoid hemorrhage, punctate focus  of diffusion abnormality in the dorsal thalamus -It was thought the patient was having seizure activity and she was loaded with Keppra -Patient has had continuous EEG monitoring: Progressively improving diffuse encephalopathy  -Neurology feels lumbar puncture is no longer necessary given that her mental status is improving -Patient had modified barium swallow, was placed on dysphagia 2 diet -Improved, appears to be at baseline  History of seizure disorder -Patient was on carbamazepine at home however this is not initiated at time of admission -Treatment plan as above, on oral Tegretol and Keppra  Diabetes insipidus/polyuria -Patient had excessive urine output thought to be due to central diabetes insipidus due to abnormalities noted on MRI of the brain -Sodium level was also elevated with high serum osmolality 100 -She was given DDAVP -Urine output over the past 24 hours 950cc -Currently sodim 136  Primary appendiceal carcinoma with metastatic disease, stage IV with abdominal wound -Oncology consultation appreciated, patient is not a candidate for chemotherapy and is thought to have an incurable condition. Poor prognosis -Palliative care was consulted. 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 surgery consulted and following- recommended TID wet to dry dressing changes  Rectal bleeding of unknown cause -Appears to be stable, patient is not a candidate for endoscopic evaluation due to recent surgery. This was discussed by previous hospitalist with general surgery.  Anemia secondary to acute blood loss -Hemoglobin currently 7.1, appears stable -transfuse if hemoglobin <7 -Continue to monitor CBC  Abdominal fluid collection on CT scan -Previous hospitalist discussed this with interventional radiologist -Patient was on Zosyn however this was discontinued as it could lower seizure threshold. Transition to cefepime and Flagyl. -Patient was on Unasyn secondary to  enterococcus urinary tract infection, completed course.  Diabetes mellitus, type II -Continue insulin sliding scale CBG monitoring  Essential hypertension -Continue clonidine, hydralazine as  needed  Enterococcus UTI -As above, patient was on Zosyn however transitioned to and completed Unasyn   Nutrition -Patient continues to be encephalopathic and had poor oral intake -Speech following, placed on dysphagia diet starting 07/10/2017 -Continue NG tube until in place given that her oral intake has been poor -will discontinue jevity feeds in hopes that patient will get "hungry" and increase her oral intake  Hypokalemia/hypomagnesemia -replaced, continue to monitor   Goals of care -Palliative care was consulted and appreciated however family wishes to continue aggressive care  DVT Prophylaxis  SCDs  Code Status: Full  Family Communication: None at bedside  Disposition Plan: Admitted. Pending weaning and removal of tube feeds/NG tube. Needs SNF  Consultants PCCM General surgery Neurology  Oncology Palliative care  Procedures  Continuous EEG  Antibiotics   Anti-infectives    Start     Dose/Rate Route Frequency Ordered Stop   07/04/17 1100  Ampicillin-Sulbactam (UNASYN) 3 g in sodium chloride 0.9 % 100 mL IVPB     3 g 200 mL/hr over 30 Minutes Intravenous Every 6 hours 07/04/17 0823 07/10/17 0429   07/03/17 1600  Ampicillin-Sulbactam (UNASYN) 3 g in sodium chloride 0.9 % 100 mL IVPB  Status:  Discontinued     3 g 200 mL/hr over 30 Minutes Intravenous Every 6 hours 07/03/17 1047 07/04/17 0823   06/29/17 1400  ceFEPIme (MAXIPIME) 2 g in dextrose 5 % 50 mL IVPB  Status:  Discontinued     2 g 100 mL/hr over 30 Minutes Intravenous Every 8 hours 06/29/17 0853 06/29/17 0857   06/29/17 1000  ceFEPIme (MAXIPIME) 2 g in dextrose 5 % 50 mL IVPB  Status:  Discontinued     2 g 100 mL/hr over 30 Minutes Intravenous Every 8 hours 06/29/17 0857 07/03/17 1046   06/29/17 0930   metroNIDAZOLE (FLAGYL) IVPB 500 mg  Status:  Discontinued     500 mg 100 mL/hr over 60 Minutes Intravenous Every 8 hours 06/29/17 0838 06/30/17 0956   06/24/17 0600  piperacillin-tazobactam (ZOSYN) IVPB 3.375 g  Status:  Discontinued     3.375 g 12.5 mL/hr over 240 Minutes Intravenous Every 8 hours 06/23/17 2254 06/29/17 0838   06/23/17 2300  piperacillin-tazobactam (ZOSYN) IVPB 3.375 g     3.375 g 100 mL/hr over 30 Minutes Intravenous  Once 06/23/17 2254 06/24/17 0056      Subjective:   Lori [name] seen and examined today.  Patient has no complaints today. Denies chest pain, shortness of breath, abdominal pain. Would like to eat chicken wings.   Objective:   Vitals:   07/14/17 0005 07/14/17 0151 07/14/17 0502 07/14/17 0639  BP: (!) 152/82 140/76 (!) 148/81 134/78  Pulse: 97 89 97 99  Resp:  18 18 18   Temp:  98.9 F (37.2 C) 98.8 F (37.1 C) 98.6 F (37 C)  TempSrc:  Oral Oral Axillary  SpO2:  100% 95% 98%  Weight:   80.8 kg (178 lb 3.2 oz)   Height:        Intake/Output Summary (Last 24 hours) at 07/14/17 1311 Last data filed at 07/14/17 0646  Gross per 24 hour  Intake          1524.66 ml  Output              950 ml  Net           574.66 ml   Filed Weights   07/12/17 0423 07/13/17 0450 07/14/17 0502  Weight: 79 kg (174 lb  1.6 oz) 91.8 kg (202 lb 6.4 oz) 80.8 kg (178 lb 3.2 oz)   Exam  General: Well developed, well nourished, NAD, appears stated age  44: NCAT,  mucous membranes moist. NG tube in place  Cardiovascular: S1 S2 auscultated, RRR, no murmurs  Respiratory: Clear to auscultation bilaterally   Abdomen: Soft, nontender, nondistended, + bowel sounds, dressing in place  Extremities: warm dry without cyanosis clubbing or edema  Neuro: AAOx3, nonfocal  Psych: Pleasant, appropriate  Data Reviewed: I have personally reviewed following labs and imaging studies  CBC:  Recent Labs Lab 07/10/17 0403 07/11/17 0342 07/12/17 0417 07/13/17 0430  07/14/17 0426  WBC 9.1 9.7 9.2 11.0* 9.4  HGB 7.3* 7.2* 7.4* 7.4* 7.1*  HCT 24.2* 23.6* 24.3* 24.3* 23.1*  MCV 92.4 90.8 89.7 90.7 89.5  PLT 259 276 248 294 470   Basic Metabolic Panel:  Recent Labs Lab 07/10/17 0403 07/11/17 0342 07/12/17 0417 07/13/17 0430 07/14/17 0426  NA 142 139 140 138 136  K 3.7 3.9 3.7 4.1 4.1  CL 111 107 107 103 103  CO2 27 27 27 29 26   GLUCOSE 151* 133* 132* 152* 115*  BUN 12 12 11 11 13   CREATININE 0.55 0.48 0.44 0.45 0.43*  CALCIUM 7.7* 7.8* 7.3* 8.0* 7.9*  MG  --  1.4* 1.6* 1.6*  --    GFR: Estimated Creatinine Clearance: 77.2 mL/min (A) (by C-G formula based on SCr of 0.43 mg/dL (L)). Liver Function Tests: No results for input(s): AST, ALT, ALKPHOS, BILITOT, PROT, ALBUMIN in the last 168 hours. No results for input(s): LIPASE, AMYLASE in the last 168 hours. No results for input(s): AMMONIA in the last 168 hours. Coagulation Profile: No results for input(s): INR, PROTIME in the last 168 hours. Cardiac Enzymes: No results for input(s): CKTOTAL, CKMB, CKMBINDEX, TROPONINI in the last 168 hours. BNP (last 3 results) No results for input(s): PROBNP in the last 8760 hours. HbA1C: No results for input(s): HGBA1C in the last 72 hours. CBG:  Recent Labs Lab 07/13/17 2020 07/13/17 2333 07/14/17 0440 07/14/17 0932 07/14/17 1249  GLUCAP 109* 97 107* 99 98   Lipid Profile: No results for input(s): CHOL, HDL, LDLCALC, TRIG, CHOLHDL, LDLDIRECT in the last 72 hours. Thyroid Function Tests: No results for input(s): TSH, T4TOTAL, FREET4, T3FREE, THYROIDAB in the last 72 hours. Anemia Panel: No results for input(s): VITAMINB12, FOLATE, FERRITIN, TIBC, IRON, RETICCTPCT in the last 72 hours. Urine analysis:    Component Value Date/Time   COLORURINE STRAW (A) 07/02/2017 1704   APPEARANCEUR CLEAR 07/02/2017 1704   LABSPEC 1.005 07/02/2017 1704   LABSPEC 1.010 08/17/2015 1542   PHURINE 8.0 07/02/2017 1704   GLUCOSEU NEGATIVE 07/02/2017 1704    GLUCOSEU 100 08/17/2015 1542   HGBUR SMALL (A) 07/02/2017 1704   BILIRUBINUR NEGATIVE 07/02/2017 1704   BILIRUBINUR Negative 08/17/2015 1542   KETONESUR NEGATIVE 07/02/2017 1704   PROTEINUR NEGATIVE 07/02/2017 1704   UROBILINOGEN 1.0 08/26/2015 0725   UROBILINOGEN 0.2 08/17/2015 1542   NITRITE NEGATIVE 07/02/2017 1704   LEUKOCYTESUR NEGATIVE 07/02/2017 1704   LEUKOCYTESUR Trace 08/17/2015 1542   Sepsis Labs: @LABRCNTIP (procalcitonin:4,lacticidven:4)  ) Recent Results (from the past 240 hour(s))  MRSA PCR Screening     Status: None   Collection Time: 07/11/17  8:58 AM  Result Value Ref Range Status   MRSA by PCR NEGATIVE NEGATIVE Final    Comment:        The GeneXpert MRSA Assay (FDA approved for NASAL specimens only), is one component  of a comprehensive MRSA colonization surveillance program. It is not intended to diagnose MRSA infection nor to guide or monitor treatment for MRSA infections.       Radiology Studies: No results found.   Scheduled Meds: . carbamazepine  150 mg Oral TID  . cloNIDine  0.1 mg Transdermal Weekly  . feeding supplement (PRO-STAT SUGAR FREE 64)  30 mL Per Tube TID  . folic acid  1 mg Per Tube Daily  . insulin aspart  0-15 Units Subcutaneous Q4H  . levETIRAcetam  1,000 mg Oral BID  . metoprolol tartrate  5 mg Intravenous Q6H  . potassium chloride  40 mEq Oral Daily   Continuous Infusions: . dextrose 5 % with kcl 40 mL/hr at 07/14/17 1048  . feeding supplement (JEVITY 1.2 CAL)       LOS: 21 days   Time Spent in minutes   30 minutes  Deslyn Cavenaugh D.O. on 07/14/2017 at 1:11 PM  Between 7am to 7pm - Pager - [phone]  After 7pm go to [email or web address] - password [email or web address]  And look for the night coverage person covering for me after hours  [facility] Office  [phone]

## 2017-07-14 NOTE — Progress Notes (Signed)
  Speech Language Pathology Treatment: Dysphagia  Patient Details Name: Lori [name] MRN: [id number] DOB: Jun 05, 1951 Today's Date: 07/14/2017 Time: 5697-9480 SLP Time Calculation (min) (ACUTE ONLY): 20 min  Assessment / Plan / Recommendation Clinical Impression  Patient was seen to address dysphagia goals. Patient stated that she does not like applesauce and that "If I don't like it, I won't eat it." She told SLP that she ate a bowl of oatmeal for breakfast, which was confirmed by family member present in room. Patient told SLP she liked ice cream, "some fruit" and for breakfast would like rice krispies or frosted flakes soaked in milk with a banana cut up. SLP gave her bites of small pieces of fruit from a fruit cup, which she chewed majority of it, however did spit out some parts that she could not chew. She exhibited mild frequency of delayed coughing, but otherwise, tolerated without difficulty.     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 She was recently hospitalized for small bowel obstruction and underwent diagnostic laparoscopy in June. She underwent repair of small bowel perforation. She had a prolonged hospital stay. Subsequently was discharged to skilled nursing facility. Presented back with rectal bleeding. Seen by oncology. Not thought to be a candidate for any treatment. Then she had what appeared to be a seizure resulting in encephalopathy. Neurology was consulted; pt with acute left thalamic CVA, bilateral small SDH. Initially evaluated by SLP 06/29/17 with recommendations for dys 2, thin liquids, however pt made NPO with cortrak placed per MD with change in MS, SLP s/o on 7/31 as pt remained unresponsive.       SLP Plan  Continue with current plan  of care       Recommendations  Diet recommendations: Dysphagia 2 (fine chop);Thin liquid Liquids provided via: Cup;No straw Medication Administration: Whole meds with liquid Supervision: Patient able to self feed;Intermittent supervision to cue for compensatory strategies Compensations: Minimize environmental distractions;Slow rate;Small sips/bites;Clear throat intermittently Postural Changes and/or Swallow Maneuvers: Seated upright 90 degrees                Oral Care Recommendations: Oral care BID Follow up Recommendations: Skilled Nursing facility SLP Visit Diagnosis: Dysphagia, oropharyngeal phase (R13.12) Plan: Continue with current plan of care       [place], MA, CCC-SLP 07/14/17 1:50 PM

## 2017-07-14 NOTE — Progress Notes (Signed)
Patient had another run of SVT 12 beats this time. Patient found sleeping denies CP, denies complaints. Reports she feels fine. Blood pressure 140/76, pulse 89, temperature 98.9 F (37.2 C) Oral, resp. rate 18,SpO2 100 % RA. Provider on call text page to notify. Jacqualyn Posey, RN

## 2017-07-14 NOTE — Progress Notes (Signed)
10 beat run of SVT, pt found sleeping. Denies any symptoms, no complaints. Provider on call notified of event. Jacqualyn Posey, RN

## 2017-07-15 LAB — BASIC METABOLIC PANEL
Anion gap: 5 (ref 5–15)
BUN: 9 mg/dL (ref 6–20)
CALCIUM: 8.1 mg/dL — AB (ref 8.9–10.3)
CO2: 29 mmol/L (ref 22–32)
CREATININE: 0.38 mg/dL — AB (ref 0.44–1.00)
Chloride: 102 mmol/L (ref 101–111)
GFR calc non Af Amer: >60 mL/min (ref >60)
GLUCOSE: 100 mg/dL — AB (ref 65–99)
Potassium: 4 mmol/L (ref 3.5–5.1)
Sodium: 136 mmol/L (ref 135–145)

## 2017-07-15 LAB — MAGNESIUM: Magnesium: 1.5 mg/dL — ABNORMAL LOW (ref 1.7–2.4)

## 2017-07-15 LAB — CBC
HEMATOCRIT: 23.5 % — AB (ref 36.0–46.0)
Hemoglobin: 7.2 g/dL — ABNORMAL LOW (ref 12.0–15.0)
MCH: 26.7 pg (ref 26.0–34.0)
MCHC: 30.6 g/dL (ref 30.0–36.0)
MCV: 87 fL (ref 78.0–100.0)
Platelets: 296 10*3/uL (ref 150–400)
RBC: 2.7 MIL/uL — ABNORMAL LOW (ref 3.87–5.11)
RDW: 17.4 % — AB (ref 11.5–15.5)
WBC: 8.3 10*3/uL (ref 4.0–10.5)

## 2017-07-15 LAB — GLUCOSE, CAPILLARY
GLUCOSE-CAPILLARY: 80 mg/dL (ref 65–99)
GLUCOSE-CAPILLARY: 83 mg/dL (ref 65–99)
GLUCOSE-CAPILLARY: 86 mg/dL (ref 65–99)
Glucose-Capillary: 90 mg/dL (ref 65–99)
Glucose-Capillary: 82 mg/dL (ref 65–99)
Glucose-Capillary: 81 mg/dL (ref 65–99)

## 2017-07-15 MED ORDER — INSULIN ASPART 100 UNIT/ML ~~LOC~~ SOLN
0.0000 [IU] | Freq: Three times a day (TID) | SUBCUTANEOUS | Status: DC
  Administered 2017-07-16 – 2017-07-19 (×5): 2 [IU] via SUBCUTANEOUS
  Administered 2017-07-20 (×3): 3 [IU] via SUBCUTANEOUS
  Administered 2017-07-21: 5 [IU] via SUBCUTANEOUS
  Administered 2017-07-21 – 2017-07-31 (×7): 2 [IU] via SUBCUTANEOUS
  Administered 2017-08-01 – 2017-08-02 (×2): 3 [IU] via SUBCUTANEOUS
  Administered 2017-08-07 – 2017-08-09 (×4): 2 [IU] via SUBCUTANEOUS

## 2017-07-15 MED ORDER — INSULIN ASPART 100 UNIT/ML ~~LOC~~ SOLN: 0.0000 [IU] | Freq: Every day | SUBCUTANEOUS | Status: DC

## 2017-07-15 NOTE — Progress Notes (Signed)
PROGRESS NOTE    Lori [name]  LPF:[id number] DOB: 26-Oct-1951 DOA: 06/23/2017 PCP: [name], MD   Chief Complaint  Patient presents with  . 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 She was at a skilled nursing facility. She was recently hospitalized for small bowel obstruction and underwent diagnostic laparoscopy in June. She underwent repair of small bowel perforation. She had a prolonged hospital stay. Subsequently was discharged to skilled nursing facility. Presented back with rectal bleeding. Seen by oncology. Not thought to be a candidate for any treatment. Then she had what appears to be a seizure resulting in encephalopathy. Neurology was consulted. Palliative medicine continues to follow. Family still desires aggressive care. Patient was transferred to stepdown unit. Critical care medicine was consulted. Patient noted to have profuse urine output. 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  Patient improving. Had MBS, started on dysphagia 2 diet however still poor oral intake, therefore NGT still in place. Patient transitioned to oral Keppra, Tegretol. Mental status improving. Neurology felt no need for lumbar puncture as mental status has been improving.  Have held tube feeds in hopes that patient will become hungry and increase her PO intake so that the NG tube can be removed. Have asked speech therapy to work with patient again today.   Assessment & Plan   Acute metabolic encephalopathy/Left Thalamic Stroke/ Bilateral small SDH -Patient was noted to have garbled speech on the afternoon of 06/28/2017 -Initial concern for stroke -Neurology consultation appreciated -CT head on 8/1: Evolving encephalomalacia at suspected left  thalamic lacunar infarct. Previously identified tiny bilateral subdural hematoma -MRI brain on 7/29: New tiny bilateral subdural hematomas, trace subarachnoid hemorrhage, punctate focus of diffusion abnormality in the dorsal thalamus -It was thought the patient was having seizure activity and she was loaded with Keppra -Patient has had continuous EEG monitoring: Progressively improving diffuse encephalopathy  -Neurology feels lumbar puncture is no longer necessary given that her mental status is improving -Patient had modified barium swallow, was placed on dysphagia 2 diet -resolved, appears to be at baseline  History of seizure disorder -Patient was on carbamazepine at home however this is not initiated at time of admission -Treatment plan as above, on oral Tegretol and Keppra  Diabetes insipidus/polyuria -Patient had excessive urine output thought to be due to central diabetes insipidus due to abnormalities noted on MRI of the brain -Sodium level was also elevated with high serum osmolality 100 -She was given DDAVP -Urine output over the past 24 hours 1900cc -Currently sodim 136  Primary appendiceal carcinoma with metastatic disease, stage IV with abdominal wound -Oncology consultation appreciated, patient is not a candidate for chemotherapy and is thought to have an incurable condition. Poor prognosis -Palliative care was consulted. 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 surgery consulted and following- recommended TID wet to dry dressing changes  Rectal bleeding of unknown cause -Appears to be stable, patient is not a candidate for endoscopic evaluation due to recent surgery. This was discussed by previous hospitalist with general surgery.  Anemia secondary to acute blood loss -Hemoglobin currently 7.2, appears stable -transfuse if hemoglobin <7 -Continue to monitor CBC  Abdominal fluid collection on CT scan -Previous hospitalist discussed this with  interventional radiologist -Patient was on Zosyn however this was discontinued as it could lower seizure  threshold. Transition to cefepime and Flagyl. -Patient was on Unasyn secondary to enterococcus urinary tract infection, completed course.  Diabetes mellitus, type II -Continue insulin sliding scale CBG monitoring  Essential hypertension -Continue clonidine, hydralazine as needed  Enterococcus UTI -As above, patient was on Zosyn however transitioned to and completed Unasyn   Nutrition -Patient continues to be encephalopathic and had poor oral intake -Speech following, placed on dysphagia diet starting 07/10/2017 -Continue NG tube until in place given that her oral intake has been poor -Discontinued Jevity tube feeds in hopes the patient will become hungry and wants to increase her oral intake -Discussed with speech therapy, would like for patient to be reassessed. Patient states she wants to eat however we're not giving her food she wants. She wishes to use cereal and a banana.  Hypokalemia/hypomagnesemia -Resolved, continue to monitor and replace as needed  Goals of care -Palliative care was consulted and appreciated however family wishes to continue aggressive care  DVT Prophylaxis  SCDs  Code Status: Full  Family Communication: None at bedside  Disposition Plan: Admitted. Pending weaning and removal of tube feeds/NG tube. Needs SNF   Consultants PCCM General surgery Neurology  Oncology Palliative care  Procedures  Continuous EEG  Antibiotics   Anti-infectives    Start     Dose/Rate Route Frequency Ordered Stop   07/04/17 1100  Ampicillin-Sulbactam (UNASYN) 3 g in sodium chloride 0.9 % 100 mL IVPB     3 g 200 mL/hr over 30 Minutes Intravenous Every 6 hours 07/04/17 0823 07/10/17 0429   07/03/17 1600  Ampicillin-Sulbactam (UNASYN) 3 g in sodium chloride 0.9 % 100 mL IVPB  Status:  Discontinued     3 g 200 mL/hr over 30 Minutes Intravenous Every 6 hours 07/03/17  1047 07/04/17 0823   06/29/17 1400  ceFEPIme (MAXIPIME) 2 g in dextrose 5 % 50 mL IVPB  Status:  Discontinued     2 g 100 mL/hr over 30 Minutes Intravenous Every 8 hours 06/29/17 0853 06/29/17 0857   06/29/17 1000  ceFEPIme (MAXIPIME) 2 g in dextrose 5 % 50 mL IVPB  Status:  Discontinued     2 g 100 mL/hr over 30 Minutes Intravenous Every 8 hours 06/29/17 0857 07/03/17 1046   06/29/17 0930  metroNIDAZOLE (FLAGYL) IVPB 500 mg  Status:  Discontinued     500 mg 100 mL/hr over 60 Minutes Intravenous Every 8 hours 06/29/17 0838 06/30/17 0956   06/24/17 0600  piperacillin-tazobactam (ZOSYN) IVPB 3.375 g  Status:  Discontinued     3.375 g 12.5 mL/hr over 240 Minutes Intravenous Every 8 hours 06/23/17 2254 06/29/17 0838   06/23/17 2300  piperacillin-tazobactam (ZOSYN) IVPB 3.375 g     3.375 g 100 mL/hr over 30 Minutes Intravenous  Once 06/23/17 2254 06/24/17 0056      Subjective:   Lori [name] seen and examined today. Would like to eat corn flakes and a banana. States she is not eating as she is not receiving any food that she likes. She denies chest pain, shortness breath, abdominal pain, nausea vomiting, headache, dizziness.  Objective:   Vitals:   07/15/17 0448 07/15/17 0656 07/15/17 1312 07/15/17 1350  BP:  (!) 144/75 (!) 146/88 (!) 149/76  Pulse:  94 86 87  Resp:   18 20  Temp:   98.7 F (37.1 C) 99.7 F (37.6 C)  TempSrc:   Oral Oral  SpO2:    98%  Weight: 95.9 kg (211 lb 6.4 oz)     Height:  Intake/Output Summary (Last 24 hours) at 07/15/17 1406 Last data filed at 07/15/17 1353  Gross per 24 hour  Intake           975.33 ml  Output             3900 ml  Net         -2924.67 ml   Filed Weights   07/13/17 0450 07/14/17 0502 07/15/17 0448  Weight: 91.8 kg (202 lb 6.4 oz) 80.8 kg (178 lb 3.2 oz) 95.9 kg (211 lb 6.4 oz)   Exam  General: Well developed, well nourished, NAD, appears stated age  HEENT: NCAT, mucous membranes moist. NG tube   Cardiovascular: S1  S2 auscultated, no rubs, murmurs or gallops. Regular rate and rhythm.  Respiratory: Clear to auscultation bilaterally with equal chest rise  Abdomen: Soft, nontender, nondistended, + bowel sounds, dressing in place  Extremities: warm dry without cyanosis clubbing or edema  Neuro: AAOx3, nonfocal  Psych: Pleasant and appropriate  Data Reviewed: I have personally reviewed following labs and imaging studies  CBC:  Recent Labs Lab 07/11/17 0342 07/12/17 0417 07/13/17 0430 07/14/17 0426 07/15/17 0408  WBC 9.7 9.2 11.0* 9.4 8.3  HGB 7.2* 7.4* 7.4* 7.1* 7.2*  HCT 23.6* 24.3* 24.3* 23.1* 23.5*  MCV 90.8 89.7 90.7 89.5 87.0  PLT 276 248 294 281 301   Basic Metabolic Panel:  Recent Labs Lab 07/11/17 0342 07/12/17 0417 07/13/17 0430 07/14/17 0426 07/15/17 0408  NA 139 140 138 136 136  K 3.9 3.7 4.1 4.1 4.0  CL 107 107 103 103 102  CO2 27 27 29 26 29   GLUCOSE 133* 132* 152* 115* 100*  BUN 12 11 11 13 9   CREATININE 0.48 0.44 0.45 0.43* 0.38*  CALCIUM 7.8* 7.3* 8.0* 7.9* 8.1*  MG 1.4* 1.6* 1.6*  --  1.5*   GFR: Estimated Creatinine Clearance: 83.8 mL/min (A) (by C-G formula based on SCr of 0.38 mg/dL (L)). Liver Function Tests: No results for input(s): AST, ALT, ALKPHOS, BILITOT, PROT, ALBUMIN in the last 168 hours. No results for input(s): LIPASE, AMYLASE in the last 168 hours. No results for input(s): AMMONIA in the last 168 hours. Coagulation Profile: No results for input(s): INR, PROTIME in the last 168 hours. Cardiac Enzymes: No results for input(s): CKTOTAL, CKMB, CKMBINDEX, TROPONINI in the last 168 hours. BNP (last 3 results) No results for input(s): PROBNP in the last 8760 hours. HbA1C: No results for input(s): HGBA1C in the last 72 hours. CBG:  Recent Labs Lab 07/14/17 2054 07/15/17 0043 07/15/17 0447 07/15/17 0840 07/15/17 1156  GLUCAP 96 83 90 86 80   Lipid Profile: No results for input(s): CHOL, HDL, LDLCALC, TRIG, CHOLHDL, LDLDIRECT in the  last 72 hours. Thyroid Function Tests: No results for input(s): TSH, T4TOTAL, FREET4, T3FREE, THYROIDAB in the last 72 hours. Anemia Panel: No results for input(s): VITAMINB12, FOLATE, FERRITIN, TIBC, IRON, RETICCTPCT in the last 72 hours. Urine analysis:    Component Value Date/Time   COLORURINE STRAW (A) 07/02/2017 1704   APPEARANCEUR CLEAR 07/02/2017 1704   LABSPEC 1.005 07/02/2017 1704   LABSPEC 1.010 08/17/2015 1542   PHURINE 8.0 07/02/2017 1704   GLUCOSEU NEGATIVE 07/02/2017 1704   GLUCOSEU 100 08/17/2015 1542   HGBUR SMALL (A) 07/02/2017 1704   BILIRUBINUR NEGATIVE 07/02/2017 1704   BILIRUBINUR Negative 08/17/2015 [place] 07/02/2017 1704   PROTEINUR NEGATIVE 07/02/2017 1704   UROBILINOGEN 1.0 08/26/2015 0725   UROBILINOGEN 0.2 08/17/2015 1542   NITRITE  NEGATIVE 07/02/2017 1704   LEUKOCYTESUR NEGATIVE 07/02/2017 1704   LEUKOCYTESUR Trace 08/17/2015 1542   Sepsis Labs: @LABRCNTIP (procalcitonin:4,lacticidven:4)  ) Recent Results (from the past 240 hour(s))  MRSA PCR Screening     Status: None   Collection Time: 07/11/17  8:58 AM  Result Value Ref Range Status   MRSA by PCR NEGATIVE NEGATIVE Final    Comment:        The GeneXpert MRSA Assay (FDA approved for NASAL specimens only), is one component of a comprehensive MRSA colonization surveillance program. It is not intended to diagnose MRSA infection nor to guide or monitor treatment for MRSA infections.       Radiology Studies: No results found.   Scheduled Meds: . carbamazepine  150 mg Oral TID  . cloNIDine  0.1 mg Transdermal Weekly  . feeding supplement (PRO-STAT SUGAR FREE 64)  30 mL Per Tube TID  . folic acid  1 mg Per Tube Daily  . insulin aspart  0-15 Units Subcutaneous Q4H  . levETIRAcetam  1,000 mg Oral BID  . metoprolol tartrate  5 mg Intravenous Q6H  . potassium chloride  40 mEq Oral Daily   Continuous Infusions: . dextrose 5 % with kcl 40 mL/hr at 07/14/17 1048  .  feeding supplement (JEVITY 1.2 CAL)       LOS: 22 days   Time Spent in minutes   30 minutes  Khalen Styer D.O. on 07/15/2017 at 2:06 PM  Between 7am to 7pm - Pager - [phone]  After 7pm go to [email or web address] - password [email or web address]  And look for the night coverage person covering for me after hours  [facility] Office  [phone]

## 2017-07-15 NOTE — Progress Notes (Signed)
  Speech Language Pathology Treatment: Dysphagia  Patient Details Name: Lori [name] MRN: [id number] DOB: 1951-02-25 Today's Date: 07/15/2017 Time: 5056-9794 SLP Time Calculation (min) (ACUTE ONLY): 12 min  Assessment / Plan / Recommendation Clinical Impression  Pt was seen for dysphagia treatment at request of MD; pt's nutritional status continues to be inadequate which is preventing pt from having NG removed. Pt has been requesting cereal and bananas. Provided trial of cereal and milk; pt tolerated without any overt s/s of aspiration. Continues to have prolonged mastication but seems to be improving. Recommend advancing diet to dysphagia 3 to provide additional diet options for pt/ increase intake, allow cereal with milk, continue full supervision to cue small bites/ sips. Will continue to follow for diet tolerance.    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 She was recently hospitalized for small bowel obstruction and underwent diagnostic laparoscopy in June. She underwent repair of small bowel perforation. She had a prolonged hospital stay. Subsequently was discharged to skilled nursing facility. Presented back with rectal bleeding. Seen by oncology. Not thought to be a candidate for any treatment. Then she had what appeared to be a seizure resulting in encephalopathy. Neurology was consulted; pt with acute left thalamic CVA, bilateral small SDH. Initially evaluated by SLP 06/29/17 with recommendations for dys 2, thin liquids, however pt made NPO with cortrak placed per MD with change in MS, SLP s/o on 7/31 as pt remained unresponsive.       SLP Plan  Continue with current plan of care       Recommendations  Diet recommendations: Dysphagia 3 (mechanical  soft);Thin liquid Liquids provided via: Cup;No straw Medication Administration: Whole meds with puree Supervision: Patient able to self feed;Full supervision/cueing for compensatory strategies Compensations: Minimize environmental distractions;Slow rate;Small sips/bites Postural Changes and/or Swallow Maneuvers: Seated upright 90 degrees                Oral Care Recommendations: Oral care BID Follow up Recommendations: Skilled Nursing facility SLP Visit Diagnosis: Dysphagia, oropharyngeal phase (R13.12) Plan: Continue with current plan of care       [place], [place], [place] 07/15/2017, 9:27 AM [phone]

## 2017-07-16 LAB — CBC
HEMATOCRIT: 23.3 % — AB (ref 36.0–46.0)
HEMOGLOBIN: 7.3 g/dL — AB (ref 12.0–15.0)
MCH: 27.1 pg (ref 26.0–34.0)
MCHC: 31.3 g/dL (ref 30.0–36.0)
MCV: 86.6 fL (ref 78.0–100.0)
Platelets: 335 10*3/uL (ref 150–400)
RBC: 2.69 MIL/uL — ABNORMAL LOW (ref 3.87–5.11)
RDW: 17.3 % — AB (ref 11.5–15.5)
WBC: 8.7 10*3/uL (ref 4.0–10.5)

## 2017-07-16 LAB — GLUCOSE, CAPILLARY
Glucose-Capillary: 88 mg/dL (ref 65–99)
Glucose-Capillary: 88 mg/dL (ref 65–99)
Glucose-Capillary: 124 mg/dL — ABNORMAL HIGH (ref 65–99)
Glucose-Capillary: 104 mg/dL — ABNORMAL HIGH (ref 65–99)

## 2017-07-16 MED ORDER — MIRTAZAPINE 15 MG PO TABS
7.5000 mg | ORAL_TABLET | Freq: Every day | ORAL | Status: DC
  Administered 2017-07-16 – 2017-08-09 (×23): 7.5 mg via ORAL
  Filled 2017-07-16 (×24): qty 1

## 2017-07-16 MED ORDER — ADULT MULTIVITAMIN W/MINERALS CH
1.0000 | ORAL_TABLET | Freq: Every day | ORAL | Status: DC
  Administered 2017-07-16 – 2017-07-17 (×2): 1 via ORAL
  Filled 2017-07-16: qty 1

## 2017-07-16 MED ORDER — ENSURE ENLIVE PO LIQD
237.0000 mL | Freq: Three times a day (TID) | ORAL | Status: DC
  Administered 2017-07-16 – 2017-07-21 (×10): 237 mL via ORAL

## 2017-07-16 NOTE — Progress Notes (Signed)
Nutrition Follow-up  DOCUMENTATION CODES:   Obesity unspecified  INTERVENTION:   -Initiate 48 hour calorie count per MD request -Ensure Enlive po TID, each supplement provides 350 kcal and 20 grams of protein -MVI daily  NUTRITION DIAGNOSIS:   Inadequate oral intake related to lethargy/confusion, poor appetite as evidenced by meal completion < 25%.  Ongoing  GOAL:   Patient will meet greater than or equal to 90% of their needs  Unmet  MONITOR:   PO intake, Diet advancement, Labs, TF tolerance, Skin, I & O's  REASON FOR ASSESSMENT:   Consult Assessment of nutrition requirement/status  ASSESSMENT:   Lori [name] is a 66 y.o. female with medical history significant of DM2, Seizures (not on medication), HTN, HLD, GERD, cervical and appendiceal carcinoma, intracranial aneurysm repair in 1997 who presents for rectal bleeding.  RD received consult to initiate calorie count and for assessment. TF has been held since 07/15/17 in attempt to stimulate appetite.   Pt very lethargic at time of visit. Spoke with nurse tech, who was obtaining vitals. She reports oral intake continues to be poor. Pt consumed a few bites of oatmeal mixed with sausage at breakfast (approximately 143 kcals and 2 grams protein) and about 25% of mashed potatoes and a few bites of chicken at lunch (approximately 85 kcals and 3 grams of protein). Per nurse tech, pt did better with lunch than breakfast, but has difficult chewing harder vegetables, such as broccoli. Also noted per SLP notes that pt was complaining about receiving food items that she did not like. Pt ha sonly consumed approximately 12% of estimated kcal needs and 4.3% of estimated protein intake per oral route.  Per nursing notes, pt with two stage II pressure injuries to left buttock as of 07/14/17. Nutritional needs adjusted to help promote wound healing.   Labs reviewed.   Diet Order:  DIET DYS 3 Room service appropriate? Yes; Fluid  consistency: Thin  Skin:  Wound (see comment) (closed abdominal incision, stage 2 lt buttocks x2)  Last BM:  07/16/17  Height:   Ht Readings from Last 1 Encounters:  07/01/17 5\' 8"  (1.727 m)    Weight:   Wt Readings from Last 1 Encounters:  07/15/17 211 lb 6.4 oz (95.9 kg)    Ideal Body Weight:  65.9 kg  BMI:  Body mass index is 32.14 kg/m.  Estimated Nutritional Needs:   Kcal:  1900-2100  Protein:  115-130 grams  Fluid:  > 1.9 L  EDUCATION NEEDS:   Education needs no appropriate at this time  Nijel Flink A. Jimmye Norman, RD, LDN, CDE Pager: [phone] After hours Pager: [phone]

## 2017-07-16 NOTE — Care Management Important Message (Signed)
Important Message  Patient Details  Name: Lori [name] MRN: [id number] Date of Birth: September 13, 1951   Medicare Important Message Given:  Yes    [name] [name] 07/16/2017, 11:30 AM

## 2017-07-16 NOTE — Progress Notes (Signed)
  Speech Language Pathology Treatment: Dysphagia  Patient Details Name: Lori [name] MRN: [id number] DOB: 1951/02/20 Today's Date: 07/16/2017 Time: 3646-8032 SLP Time Calculation (min) (ACUTE ONLY): 21 min  Assessment / Plan / Recommendation Clinical Impression  Required much verbal encouragement to take one spoon of oatmeal with sausage mixed in; stated "sausage tastes funny." She requests chicken wings and SLP/pt called sister to request they bring pt chicken wings if possible. Sister stated they live in [place]. This therapist may buy some in the cafeteria. Two delayed coughs that did not seem related to testing with straws. Continue therapy and encouragement of po's. Explained need to remove feeding tube prior to her being able to discharge.    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 She was recently hospitalized for small bowel obstruction and underwent diagnostic laparoscopy in June. She underwent repair of small bowel perforation. She had a prolonged hospital stay. Subsequently was discharged to skilled nursing facility. Presented back with rectal bleeding. Seen by oncology. Not thought to be a candidate for any treatment. Then she had what appeared to be a seizure resulting in encephalopathy. Neurology was consulted; pt with acute left thalamic CVA, bilateral small SDH. Initially evaluated by SLP 06/29/17 with recommendations for dys 2, thin liquids, however pt made NPO with cortrak placed per MD with change in MS, SLP s/o on 7/31 as pt remained unresponsive.       SLP Plan  Continue with current plan of care       Recommendations  Diet recommendations: Dysphagia 3 (mechanical soft);Thin liquid Liquids provided via: Cup;Straw Medication Administration:  Whole meds with puree Supervision: Patient able to self feed;Full supervision/cueing for compensatory strategies Compensations: Minimize environmental distractions;Slow rate;Small sips/bites Postural Changes and/or Swallow Maneuvers: Seated upright 90 degrees                Oral Care Recommendations: Oral care BID Follow up Recommendations: Skilled Nursing facility SLP Visit Diagnosis: Dysphagia, oropharyngeal phase (R13.12) Plan: Continue with current plan of care       GO                Houston Siren 07/16/2017, 9:21 AM    Orbie Pyo Colvin Caroli.Ed [employer] [phone]

## 2017-07-16 NOTE — Progress Notes (Signed)
[facility] Progress Note     Subjective: CC: No new complaints Patient tolerating diet, getting some TF. Denies abdominal pain, n/v. Having BMs.  Tmax 102 8/12, improved today.   Objective: Vital signs in last 24 hours: Temp:  [97.8 F (36.6 C)-102 F (38.9 C)] 98.8 F (37.1 C) (08/13 0618) Pulse Rate:  [82-96] 93 (08/13 0618) Resp:  [17-20] 17 (08/12 2211) BP: (144-170)/(76-88) 158/76 (08/13 0618) SpO2:  [98 %-100 %] 100 % (08/13 0618) Last BM Date: 07/15/17 (flexiseal)  Intake/Output from previous day: 08/12 0701 - 08/13 0700 In: -  Out: 2100 [Urine:2100] Intake/Output this shift: No intake/output data recorded.  PE: Gen:  Alert, NAD, cooperative Card:  Regular rate and rhythm, pedal pulses 2+ BL Pulm:  Normal effort, clear to auscultation bilaterally Abd: Soft, non-tender, non-distended, bowel sounds present, no HSM, midline wound below   Skin: warm and dry, no rashes  Psych: A&Ox3   Lab Results:   Recent Labs  07/15/17 0408 07/16/17 0521  WBC 8.3 8.7  HGB 7.2* 7.3*  HCT 23.5* 23.3*  PLT 296 335   BMET  Recent Labs  07/14/17 0426 07/15/17 0408  NA 136 136  K 4.1 4.0  CL 103 102  CO2 26 29  GLUCOSE 115* 100*  BUN 13 9  CREATININE 0.43* 0.38*  CALCIUM 7.9* 8.1*   CMP     Component Value Date/Time   NA 136 07/15/2017 0408   K 4.0 07/15/2017 0408   CL 102 07/15/2017 0408   CO2 29 07/15/2017 0408   GLUCOSE 100 (H) 07/15/2017 0408   BUN 9 07/15/2017 0408   CREATININE 0.38 (L) 07/15/2017 0408   CALCIUM 8.1 (L) 07/15/2017 0408   PROT 5.8 (L) 06/30/2017 0439   ALBUMIN 1.4 (L) 06/30/2017 0439   AST 12 (L) 06/30/2017 0439   ALT 8 (L) 06/30/2017 0439   ALKPHOS 80 06/30/2017 0439   BILITOT 0.3 06/30/2017 0439   GFRNONAA >60 07/15/2017 0408   GFRAA >60 07/15/2017 0408   Lipase     Component Value Date/Time   LIPASE 31 05/14/2017 2106    Anti-infectives: Anti-infectives    Start     Dose/Rate Route Frequency Ordered Stop   07/04/17 1100  Ampicillin-Sulbactam (UNASYN) 3 g in sodium chloride 0.9 % 100 mL IVPB     3 g 200 mL/hr over 30 Minutes Intravenous Every 6 hours 07/04/17 0823 07/10/17 0429   07/03/17 1600  Ampicillin-Sulbactam (UNASYN) 3 g in sodium chloride 0.9 % 100 mL IVPB  Status:  Discontinued     3 g 200 mL/hr over 30 Minutes Intravenous Every 6 hours 07/03/17 1047 07/04/17 0823   06/29/17 1400  ceFEPIme (MAXIPIME) 2 g in dextrose 5 % 50 mL IVPB  Status:  Discontinued     2 g 100 mL/hr over 30 Minutes Intravenous Every 8 hours 06/29/17 0853 06/29/17 0857   06/29/17 1000  ceFEPIme (MAXIPIME) 2 g in dextrose 5 % 50 mL IVPB  Status:  Discontinued     2 g 100 mL/hr over 30 Minutes Intravenous Every 8 hours 06/29/17 0857 07/03/17 1046   06/29/17 0930  metroNIDAZOLE (FLAGYL) IVPB 500 mg  Status:  Discontinued     500 mg 100 mL/hr over 60 Minutes Intravenous Every 8 hours 06/29/17 0838 06/30/17 0956   06/24/17 0600  piperacillin-tazobactam (ZOSYN) IVPB 3.375 g  Status:  Discontinued     3.375 g 12.5 mL/hr over 240 Minutes Intravenous Every 8 hours 06/23/17 2254 06/29/17 6283  06/23/17 2300  piperacillin-tazobactam (ZOSYN) IVPB 3.375 g     3.375 g 100 mL/hr over 30 Minutes Intravenous  Once 06/23/17 2254 06/24/17 0056       Assessment/Plan Diabetes insipidus Seizure disorder Lower GI bleed - resolved Anemia - Hg 7.3,stable DM-II HTN Enterococcus UTI - completed unasyn Acute metabolic encephalopathy/Left Thalamic Stroke/ Bilateral small SDH Malnutrition - started on dysphagia diet 8/7, but due to poor PO intake TF is being continued  Primary appendiceal carcinoma with metastatic disease, stage IV  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 [name]: 1. 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  2. Exploratory laparotomy, drainage of abdominal abscess, repair of small bowel  perforation, repair of small bowel enterotomy, enterocolostomy (mid ileum to mid transverse colon). - tolerating TF and having bowel function  FEN- DYS III, TF VTE- SCDs, No anticoagulants due to anemia ID- Zosyn 7/21>>7/27, Flagyl 7/27>>7/28, Cefipime 7/27>>7/27, Unasyn 7/31>>8/7  Plan: TID wet to dry dressing changes to abdominal wound.  LOS: 23 days    Brigid Re , [facility] Surgery 07/16/2017, 9:42 AM Pager: [phone] Consults: [phone] Mon-Fri 7:00 am-4:30 pm Sat-Sun 7:00 am-11:30 am

## 2017-07-16 NOTE — Progress Notes (Signed)
PROGRESS NOTE    Lori [name]  OVF:[id number] DOB: 01/02/1951 DOA: 06/23/2017 PCP: [name], MD   Chief Complaint  Patient presents with  . 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 She was at a skilled nursing facility. She was recently hospitalized for small bowel obstruction and underwent diagnostic laparoscopy in June. She underwent repair of small bowel perforation. She had a prolonged hospital stay. Subsequently was discharged to skilled nursing facility. Presented back with rectal bleeding. Seen by oncology. Not thought to be a candidate for any treatment. Then she had what appears to be a seizure resulting in encephalopathy. Neurology was consulted. Palliative medicine continues to follow. Family still desires aggressive care. Patient was transferred to stepdown unit. Critical care medicine was consulted. Patient noted to have profuse urine output. 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  Patient improving. Had MBS, started on dysphagia 2 diet however still poor oral intake, therefore NGT still in place. Patient transitioned to oral Keppra, Tegretol. Mental status improving. Neurology felt no need for lumbar puncture as mental status has been improving.  Have held tube feeds in hopes that patient will become hungry and increase her PO intake so that the NG tube can be removed.   Assessment & Plan   Acute metabolic encephalopathy/Left Thalamic Stroke/ Bilateral small SDH -Patient was noted to have garbled speech on the afternoon of 06/28/2017 -Initial concern for stroke -Neurology consultation appreciated -CT head on 8/1: Evolving encephalomalacia at suspected left thalamic lacunar infarct. Previously identified tiny  bilateral subdural hematoma -MRI brain on 7/29: New tiny bilateral subdural hematomas, trace subarachnoid hemorrhage, punctate focus of diffusion abnormality in the dorsal thalamus -It was thought the patient was having seizure activity and she was loaded with Keppra -Patient has had continuous EEG monitoring: Progressively improving diffuse encephalopathy  -Neurology feels lumbar puncture is no longer necessary given that her mental status is improving -Patient had modified barium swallow, was placed on dysphagia 2 diet -Improved, at baseline  History of seizure disorder -Patient was on carbamazepine at home however this is not initiated at time of admission -Treatment plan as above, on oral Tegretol and Keppra  Diabetes insipidus/polyuria -Patient had excessive urine output thought to be due to central diabetes insipidus due to abnormalities noted on MRI of the brain -Sodium level was also elevated with high serum osmolality 100 -She was given DDAVP -Urine output over the past 24 hours 2100cc -Currently sodim 136  Primary appendiceal carcinoma with metastatic disease, stage IV with abdominal wound -Oncology consultation appreciated, patient is not a candidate for chemotherapy and is thought to have an incurable condition. Poor prognosis -Palliative care was consulted. 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 surgery consulted and following- recommended TID wet to dry dressing changes  Rectal bleeding of unknown cause -Appears to be stable, patient is not a candidate for endoscopic evaluation due to recent surgery. This was discussed by previous hospitalist with general surgery.  Anemia secondary to acute blood loss -Hemoglobin currently 7.3, appears stable -transfuse if hemoglobin <7 -Continue to monitor CBC  Abdominal fluid collection on CT scan -Previous hospitalist discussed this with interventional radiologist -Patient was on Zosyn however this was  discontinued as it could lower seizure threshold. Transition to cefepime and Flagyl. -Patient was on Unasyn secondary to enterococcus  urinary tract infection, completed course.  Diabetes mellitus, type II -Continue insulin sliding scale CBG monitoring  Essential hypertension -Continue clonidine, hydralazine as needed  Enterococcus UTI -As above, patient was on Zosyn however transitioned to and completed Unasyn   Nutrition -Patient continues to be encephalopathic and had poor oral intake -Speech following, placed on dysphagia diet starting 07/10/2017 -Continue NG tube until in place given that her oral intake has been poor -Discontinued Jevity tube feeds in hopes the patient will become hungry and wants to increase her oral intake -Discussed with speech therapy, patient reassessed, currently on dysphagia 3 diet -Nutrition consulted and appreciated, had asked for calorie count -Would like to DC NG tube however given the patient's oral intake has been poor, will hold off until patient able to intake more.  Hypokalemia/hypomagnesemia -Resolved, continue to monitor and replace as needed  Goals of care -Palliative care was consulted and appreciated however family wishes to continue aggressive care  DVT Prophylaxis  SCDs  Code Status: Full  Family Communication: None at bedside  Disposition Plan: Admitted. Pending improvement and oral intake. D/c to SNF when stable  Consultants PCCM General surgery Neurology  Oncology Palliative care  Procedures  Continuous EEG  Antibiotics   Anti-infectives    Start     Dose/Rate Route Frequency Ordered Stop   07/04/17 1100  Ampicillin-Sulbactam (UNASYN) 3 g in sodium chloride 0.9 % 100 mL IVPB     3 g 200 mL/hr over 30 Minutes Intravenous Every 6 hours 07/04/17 0823 07/10/17 0429   07/03/17 1600  Ampicillin-Sulbactam (UNASYN) 3 g in sodium chloride 0.9 % 100 mL IVPB  Status:  Discontinued     3 g 200 mL/hr over 30 Minutes Intravenous  Every 6 hours 07/03/17 1047 07/04/17 0823   06/29/17 1400  ceFEPIme (MAXIPIME) 2 g in dextrose 5 % 50 mL IVPB  Status:  Discontinued     2 g 100 mL/hr over 30 Minutes Intravenous Every 8 hours 06/29/17 0853 06/29/17 0857   06/29/17 1000  ceFEPIme (MAXIPIME) 2 g in dextrose 5 % 50 mL IVPB  Status:  Discontinued     2 g 100 mL/hr over 30 Minutes Intravenous Every 8 hours 06/29/17 0857 07/03/17 1046   06/29/17 0930  metroNIDAZOLE (FLAGYL) IVPB 500 mg  Status:  Discontinued     500 mg 100 mL/hr over 60 Minutes Intravenous Every 8 hours 06/29/17 0838 06/30/17 0956   06/24/17 0600  piperacillin-tazobactam (ZOSYN) IVPB 3.375 g  Status:  Discontinued     3.375 g 12.5 mL/hr over 240 Minutes Intravenous Every 8 hours 06/23/17 2254 06/29/17 0838   06/23/17 2300  piperacillin-tazobactam (ZOSYN) IVPB 3.375 g     3.375 g 100 mL/hr over 30 Minutes Intravenous  Once 06/23/17 2254 06/24/17 0056      Subjective:   [name] seen and examined today. Not very talkative this morning. Does complain of abdominal pain but will not elaborate. Denies chest pain or shortness of breath.   Objective:   Vitals:   07/15/17 1812 07/15/17 2211 07/16/17 0056 07/16/17 0618  BP: (!) 148/76 (!) 144/77 (!) 148/77 (!) 158/76  Pulse: 88 94 96 93  Resp: 18 17    Temp: (!) 102 F (38.9 C) 98.2 F (36.8 C)  98.8 F (37.1 C)  TempSrc: Oral Oral  Oral  SpO2:  98%  100%  Weight:      Height:        Intake/Output Summary (Last 24 hours) at 07/16/17 1322 Last data  filed at 07/16/17 0131  Gross per 24 hour  Intake                0 ml  Output             2100 ml  Net            -2100 ml   Filed Weights   07/13/17 0450 07/14/17 0502 07/15/17 0448  Weight: 91.8 kg (202 lb 6.4 oz) 80.8 kg (178 lb 3.2 oz) 95.9 kg (211 lb 6.4 oz)   Exam  General: Well developed, well nourished, NAD  HEENT: NCAT,  mucous membranes moist. NG tube in place  Cardiovascular: S1 S2 auscultated, soft murmur, RRR  Respiratory:  Clear to auscultation bilaterally, no wheezing   Abdomen: Soft, nontender, nondistended, + bowel sounds, dressing in place  Extremities: warm dry without cyanosis clubbing or edema  Neuro: AAOx3, nonfocal (not interactive today)  Psych: flat affect  Data Reviewed: I have personally reviewed following labs and imaging studies  CBC:  Recent Labs Lab 07/12/17 0417 07/13/17 0430 07/14/17 0426 07/15/17 0408 07/16/17 0521  WBC 9.2 11.0* 9.4 8.3 8.7  HGB 7.4* 7.4* 7.1* 7.2* 7.3*  HCT 24.3* 24.3* 23.1* 23.5* 23.3*  MCV 89.7 90.7 89.5 87.0 86.6  PLT 248 294 281 296 683   Basic Metabolic Panel:  Recent Labs Lab 07/11/17 0342 07/12/17 0417 07/13/17 0430 07/14/17 0426 07/15/17 0408  NA 139 140 138 136 136  K 3.9 3.7 4.1 4.1 4.0  CL 107 107 103 103 102  CO2 27 27 29 26 29   GLUCOSE 133* 132* 152* 115* 100*  BUN 12 11 11 13 9   CREATININE 0.48 0.44 0.45 0.43* 0.38*  CALCIUM 7.8* 7.3* 8.0* 7.9* 8.1*  MG 1.4* 1.6* 1.6*  --  1.5*   GFR: Estimated Creatinine Clearance: 83.8 mL/min (A) (by C-G formula based on SCr of 0.38 mg/dL (L)). Liver Function Tests: No results for input(s): AST, ALT, ALKPHOS, BILITOT, PROT, ALBUMIN in the last 168 hours. No results for input(s): LIPASE, AMYLASE in the last 168 hours. No results for input(s): AMMONIA in the last 168 hours. Coagulation Profile: No results for input(s): INR, PROTIME in the last 168 hours. Cardiac Enzymes: No results for input(s): CKTOTAL, CKMB, CKMBINDEX, TROPONINI in the last 168 hours. BNP (last 3 results) No results for input(s): PROBNP in the last 8760 hours. HbA1C: No results for input(s): HGBA1C in the last 72 hours. CBG:  Recent Labs Lab 07/15/17 1156 07/15/17 1651 07/15/17 2210 07/16/17 0818 07/16/17 1211  GLUCAP 80 82 81 88 88   Lipid Profile: No results for input(s): CHOL, HDL, LDLCALC, TRIG, CHOLHDL, LDLDIRECT in the last 72 hours. Thyroid Function Tests: No results for input(s): TSH, T4TOTAL,  FREET4, T3FREE, THYROIDAB in the last 72 hours. Anemia Panel: No results for input(s): VITAMINB12, FOLATE, FERRITIN, TIBC, IRON, RETICCTPCT in the last 72 hours. Urine analysis:    Component Value Date/Time   COLORURINE STRAW (A) 07/02/2017 1704   APPEARANCEUR CLEAR 07/02/2017 1704   LABSPEC 1.005 07/02/2017 1704   LABSPEC 1.010 08/17/2015 1542   PHURINE 8.0 07/02/2017 1704   GLUCOSEU NEGATIVE 07/02/2017 1704   GLUCOSEU 100 08/17/2015 1542   HGBUR SMALL (A) 07/02/2017 1704   BILIRUBINUR NEGATIVE 07/02/2017 1704   BILIRUBINUR Negative 08/17/2015 [place] 07/02/2017 1704   PROTEINUR NEGATIVE 07/02/2017 1704   UROBILINOGEN 1.0 08/26/2015 0725   UROBILINOGEN 0.2 08/17/2015 1542   NITRITE NEGATIVE 07/02/2017 1704   LEUKOCYTESUR NEGATIVE 07/02/2017  [place] 08/17/2015 1542   Sepsis Labs: @LABRCNTIP (procalcitonin:4,lacticidven:4)  ) Recent Results (from the past 240 hour(s))  MRSA PCR Screening     Status: None   Collection Time: 07/11/17  8:58 AM  Result Value Ref Range Status   MRSA by PCR NEGATIVE NEGATIVE Final    Comment:        The GeneXpert MRSA Assay (FDA approved for NASAL specimens only), is one component of a comprehensive MRSA colonization surveillance program. It is not intended to diagnose MRSA infection nor to guide or monitor treatment for MRSA infections.       Radiology Studies: No results found.   Scheduled Meds: . carbamazepine  150 mg Oral TID  . cloNIDine  0.1 mg Transdermal Weekly  . feeding supplement (PRO-STAT SUGAR FREE 64)  30 mL Per Tube TID  . folic acid  1 mg Per Tube Daily  . insulin aspart  0-15 Units Subcutaneous TID WC  . insulin aspart  0-5 Units Subcutaneous QHS  . levETIRAcetam  1,000 mg Oral BID  . metoprolol tartrate  5 mg Intravenous Q6H  . mirtazapine  7.5 mg Oral QHS  . potassium chloride  40 mEq Oral Daily   Continuous Infusions: . dextrose 5 % with kcl 40 mL/hr at 07/14/17 1048  .  feeding supplement (JEVITY 1.2 CAL)       LOS: 23 days   Time Spent in minutes   30 minutes  Amie Cowens D.O. on 07/16/2017 at 1:22 PM  Between 7am to 7pm - Pager - [phone]  After 7pm go to [email or web address] - password [email or web address]  And look for the night coverage person covering for me after hours  [facility] Office  [phone]

## 2017-07-16 NOTE — Progress Notes (Signed)
Physical Therapy Treatment Patient Details Name: Lori [name] MRN: [id number] DOB: [birth year] Today's Date: 07/16/2017    History of Present Illness Lori [name] is a very nice 66 year old African-American female. She lives in [place]. She had a sore toward surgery about a month ago. 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 She probably was diagnosed with this a year ago after having an appendectomy. Chemotherapy and radiotherapy.  Non-healed intra-abdominal abcess with VAC in place as complication after prior surgeries. Pt has other new abcesses that MD has explained that no further treatment will be successful and MD recommending palliative care discussions with son and pt.     PT Comments    Pt continues to require total assist for all mobilities. Patient would benefit from continued skilled PT to increase functional independence and safety with mobility to allow d/c to the venue listed below. Will continue to follow acutely.      Follow Up Recommendations  SNF;Supervision/Assistance - 24 hour     Equipment Recommendations  Wheelchair (measurements PT)    Recommendations for Other Services       Precautions / Restrictions Precautions Precautions: Fall Precaution Comments: rectal pouch, foley Restrictions Weight Bearing Restrictions: No    Mobility  Bed Mobility Overal bed mobility: Needs Assistance Bed Mobility: Supine to Sit     Supine to sit: Total assist;+2 for physical assistance     General bed mobility comments: max assist to bring LEs to EOB, use of chuck pad to pull hips toward EOB then utilized mechanical feature of bed to elevate HOB to assist to halfway position, total assist to complete trunk elevation to upright at EOB.  Transfers Overall transfer level: Needs assistance Equipment used: None Transfers: Lateral/Scoot Transfers          Lateral/Scoot Transfers: Total assist;+2 safety/equipment General transfer  comment: Pt required max A to transfer from bed to drop arm recliner. Used lateral scoot with chuck pad under hips.  Ambulation/Gait             General Gait Details: unable   Stairs            Wheelchair Mobility    Modified Rankin (Stroke Patients Only)       Balance Overall balance assessment: Needs assistance Sitting-balance support: Bilateral upper extremity supported;Feet supported Sitting balance-Leahy Scale: Poor Sitting balance - Comments: EOB ~3 minutes. Cueing to use UEs for support, however pt required max A to maintain upright position Postural control: Posterior lean;Left lateral lean                                  Cognition Arousal/Alertness: Awake/alert Behavior During Therapy: Flat affect Overall Cognitive Status: Impaired/Different from baseline Area of Impairment: Problem solving                       Following Commands: Follows one step commands with increased time     Problem Solving: Slow processing;Requires verbal cues;Requires tactile cues General Comments: patient pleasent and interactive but flat throughout session.       Exercises      General Comments        Pertinent Vitals/Pain Pain Assessment: No/denies pain Pain Location: denies pain Pain Descriptors / Indicators: Aching;Grimacing;Guarding Pain Intervention(s): Monitored during session    Home Living  Prior Function            PT Goals (current goals can now be found in the care plan section) Acute Rehab PT Goals Patient Stated Goal: to go back to Rehab and continue to work in parallel bars PT Goal Formulation: With patient Time For Goal Achievement: 07/23/17 Potential to Achieve Goals: Fair Progress towards PT goals: Progressing toward goals (slowly)    Frequency    Min 2X/week      PT Plan Current plan remains appropriate    Co-evaluation              AM-PAC PT "6 Clicks" Daily Activity   Outcome Measure  Difficulty turning over in bed (including adjusting bedclothes, sheets and blankets)?: Total Difficulty moving from lying on back to sitting on the side of the bed? : Total Difficulty sitting down on and standing up from a chair with arms (e.g., wheelchair, bedside commode, etc,.)?: Total Help needed moving to and from a bed to chair (including a wheelchair)?: Total Help needed walking in hospital room?: Total Help needed climbing 3-5 steps with a railing? : Total 6 Click Score: 6    End of Session Equipment Utilized During Treatment: Gait belt Activity Tolerance: Patient tolerated treatment well Patient left: with call bell/phone within reach;in chair;with family/visitor present Nurse Communication: Mobility status;Need for lift equipment PT Visit Diagnosis: Unsteadiness on feet (R26.81);Muscle weakness (generalized) (M62.81);Difficulty in walking, not elsewhere classified (R26.2)     Time: 2202-5427 PT Time Calculation (min) (ACUTE ONLY): 26 min  Charges:  $Therapeutic Activity: 23-37 mins                    G Codes:       Benjiman Core, [place] Pager [phone] Acute Rehab   Allena Katz 07/16/2017, 3:48 PM

## 2017-07-17 DIAGNOSIS — R627 Adult failure to thrive: Secondary | ICD-10-CM

## 2017-07-17 LAB — GLUCOSE, CAPILLARY
Glucose-Capillary: 94 mg/dL (ref 65–99)
Glucose-Capillary: 104 mg/dL — ABNORMAL HIGH (ref 65–99)
Glucose-Capillary: 141 mg/dL — ABNORMAL HIGH (ref 65–99)
Glucose-Capillary: 115 mg/dL — ABNORMAL HIGH (ref 65–99)

## 2017-07-17 LAB — CBC
HCT: 23.2 % — ABNORMAL LOW (ref 36.0–46.0)
HEMOGLOBIN: 7.1 g/dL — AB (ref 12.0–15.0)
MCH: 26.7 pg (ref 26.0–34.0)
MCHC: 30.6 g/dL (ref 30.0–36.0)
MCV: 87.2 fL (ref 78.0–100.0)
PLATELETS: 343 10*3/uL (ref 150–400)
RBC: 2.66 MIL/uL — AB (ref 3.87–5.11)
RDW: 17.4 % — ABNORMAL HIGH (ref 11.5–15.5)
WBC: 7.8 10*3/uL (ref 4.0–10.5)

## 2017-07-17 LAB — BASIC METABOLIC PANEL
Anion gap: 6 (ref 5–15)
BUN: 5 mg/dL — AB (ref 6–20)
CHLORIDE: 100 mmol/L — AB (ref 101–111)
CO2: 27 mmol/L (ref 22–32)
Calcium: 7.8 mg/dL — ABNORMAL LOW (ref 8.9–10.3)
Creatinine, Ser: 0.52 mg/dL (ref 0.44–1.00)
GFR calc Af Amer: >60 mL/min (ref >60)
GFR calc non Af Amer: >60 mL/min (ref >60)
GLUCOSE: 106 mg/dL — AB (ref 65–99)
POTASSIUM: 3.8 mmol/L (ref 3.5–5.1)
Sodium: 133 mmol/L — ABNORMAL LOW (ref 135–145)

## 2017-07-17 MED ORDER — PRO-STAT SUGAR FREE PO LIQD
30.0000 mL | Freq: Two times a day (BID) | ORAL | Status: DC
  Administered 2017-07-17 – 2017-07-21 (×8): 30 mL
  Filled 2017-07-17 (×8): qty 30

## 2017-07-17 MED ORDER — JEVITY 1.5 CAL/FIBER PO LIQD
1000.0000 mL | ORAL | Status: DC
  Administered 2017-07-17 – 2017-07-20 (×4): 1000 mL
  Filled 2017-07-17 (×5): qty 1000

## 2017-07-17 MED ORDER — ADULT MULTIVITAMIN LIQUID CH
15.0000 mL | Freq: Every day | ORAL | Status: DC
  Administered 2017-07-17 – 2017-07-26 (×9): 15 mL
  Filled 2017-07-17 (×10): qty 15

## 2017-07-17 NOTE — Progress Notes (Signed)
PROGRESS NOTE    Lori [name]  TML:[id number] DOB: [birth year] DOA: 06/23/2017 PCP: [name], MD   Chief Complaint  Patient presents with  . 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 She was at a skilled nursing facility. She was recently hospitalized for small bowel obstruction and underwent diagnostic laparoscopy in June. She underwent repair of small bowel perforation. She had a prolonged hospital stay. Subsequently was discharged to skilled nursing facility. Presented back with rectal bleeding. Seen by oncology. Not thought to be a candidate for any treatment. Then she had what appears to be a seizure resulting in encephalopathy. Neurology was consulted. Palliative medicine continues to follow. Family still desires aggressive care. Patient was transferred to stepdown unit. Critical care medicine was consulted. Patient noted to have profuse urine output. 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 Patient improving. Had MBS, started on dysphagia 2 diet however still poor oral intake, therefore NGT still in place. Patient transitioned to oral Keppra, Tegretol. Mental status improving. Neurology felt no need for lumbar puncture as mental status has been improving. Have held tube feeds in hopes that patient will become hungry and increase her PO intake so that the NG tube can be removed.   Assessment & Plan   Acute metabolic encephalopathy/Left Thalamic Stroke/ Bilateral small SDH -Patient was noted to have garbled speech on the afternoon of 06/28/2017 -Initial concern for stroke -Neurology consultation appreciated -CT head on 8/1: Evolving encephalomalacia at suspected left thalamic lacunar infarct. Previously identified tiny bilateral  subdural hematoma -MRI brain on 7/29: New tiny bilateral subdural hematomas, trace subarachnoid hemorrhage, punctate focus of diffusion abnormality in the dorsal thalamus -It was thought the patient was having seizure activity and she was loaded with Keppra -Patient has had continuous EEG monitoring: Progressively improving diffuse encephalopathy  -Neurology feels lumbar puncture is no longer necessary given that her mental status is improving -Patient had modified barium swallow, was placed on dysphagia 2 diet -Improved, at baseline  History of seizure disorder -Patient was on carbamazepine at home however this is not initiated at time of admission -Treatment plan as above, on oral Tegretol and Keppra  Diabetes insipidus/polyuria -Patient had excessive urine output thought to be due to central diabetes insipidus due to abnormalities noted on MRI of the brain -Sodium level was also elevated with high serum osmolality 100 -She was given DDAVP -Urine output over the past 24 hours 2100cc -sodium mildly decreased today, down to 133   Primary appendiceal carcinoma with metastatic disease, stage IV with abdominal wound -Oncology consultation appreciated, patient is not a candidate for chemotherapy and is thought to have an incurable condition. Poor prognosis -Palliative care was consulted. 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 surgery consulted and following- recommended TID wet to dry dressing changes  Rectal bleeding of unknown cause -Appears to be stable, patient is not a candidate for endoscopic evaluation due to recent surgery. This was discussed by previous hospitalist with general surgery.  Anemia secondary to acute blood loss -Hemoglobin currently 7.1, appears stable -transfuse if hemoglobin <7 -Continue to monitor CBC  Abdominal fluid collection on CT scan -Previous hospitalist discussed this with interventional radiologist -Patient was on Zosyn however  this was discontinued as it could lower seizure threshold. Transition to cefepime and Flagyl. -Patient was on Unasyn  secondary to enterococcus urinary tract infection, completed course.  Diabetes mellitus, type II -Continue insulin sliding scale CBG monitoring  Essential hypertension -Continue clonidine, hydralazine as needed  Enterococcus UTI -As above, patient was on Zosyn however transitioned to and completed Unasyn   Nutrition -Patient continues to be encephalopathic and had poor oral intake -Speech following, placed on dysphagia diet starting 07/10/2017 -Continue NG tube until in place given that her oral intake has been poor -Discontinued Jevity tube feeds in hopes the patient will become hungry and wants to increase her oral intake -Discussed with speech therapy, patient reassessed, currently on dysphagia 3 diet -Nutrition consulted and appreciated, had asked for calorie count -Only taking in 10-15% of her meal -Would like to DC NG tube however given the patient's oral intake has been poor -given that oral intake is poor, will restart jevity at night -Continue Ensure -Started patient on Remeron  -patient does not appear to be a good peg tube candidate given carcinomatosis  Hypokalemia/hypomagnesemia -Resolved, continue to monitor and replace as needed  Goals of care -Palliative care was consulted and appreciated however family wishes to continue aggressive care -Discussed with palliative care, Wadie Lessen NP, she will try to speak with the family  -Feel patient has an overall poor prognosis as she has had poor oral intake and stage IV appendiceal cancer  DVT Prophylaxis  SCDs  Code Status: Full  Family Communication: None at bedside  Disposition Plan: Admitted. Pending improvement in oral intake. SNF vs LTAC (have spoken to Case management, pending [facility]'s evaluation)  Consultants PCCM General surgery Neurology  Oncology Palliative care  Procedures    Continuous EEG  Antibiotics   Anti-infectives    Start     Dose/Rate Route Frequency Ordered Stop   07/04/17 1100  Ampicillin-Sulbactam (UNASYN) 3 g in sodium chloride 0.9 % 100 mL IVPB     3 g 200 mL/hr over 30 Minutes Intravenous Every 6 hours 07/04/17 0823 07/10/17 0429   07/03/17 1600  Ampicillin-Sulbactam (UNASYN) 3 g in sodium chloride 0.9 % 100 mL IVPB  Status:  Discontinued     3 g 200 mL/hr over 30 Minutes Intravenous Every 6 hours 07/03/17 1047 07/04/17 0823   06/29/17 1400  ceFEPIme (MAXIPIME) 2 g in dextrose 5 % 50 mL IVPB  Status:  Discontinued     2 g 100 mL/hr over 30 Minutes Intravenous Every 8 hours 06/29/17 0853 06/29/17 0857   06/29/17 1000  ceFEPIme (MAXIPIME) 2 g in dextrose 5 % 50 mL IVPB  Status:  Discontinued     2 g 100 mL/hr over 30 Minutes Intravenous Every 8 hours 06/29/17 0857 07/03/17 1046   06/29/17 0930  metroNIDAZOLE (FLAGYL) IVPB 500 mg  Status:  Discontinued     500 mg 100 mL/hr over 60 Minutes Intravenous Every 8 hours 06/29/17 0838 06/30/17 0956   06/24/17 0600  piperacillin-tazobactam (ZOSYN) IVPB 3.375 g  Status:  Discontinued     3.375 g 12.5 mL/hr over 240 Minutes Intravenous Every 8 hours 06/23/17 2254 06/29/17 0838   06/23/17 2300  piperacillin-tazobactam (ZOSYN) IVPB 3.375 g     3.375 g 100 mL/hr over 30 Minutes Intravenous  Once 06/23/17 2254 06/24/17 0056      Subjective:   Lori [name] seen and examined today. Patient not very interactive this morning. Responds to yes and no questions. Does not wish to be bothered at this time. Does not wish to elaborate on March. Patient shakes her head no to chest pain, shortness of  breath, headache. Feels abdominal pain.  Objective:   Vitals:   07/17/17 0544 07/17/17 0923 07/17/17 1256 07/17/17 1336  BP: (!) 146/87 (!) 145/94 139/78 (!) 155/82  Pulse: 96 96 90 91  Resp: 20   16  Temp: 98.8 F (37.1 C)   98.8 F (37.1 C)  TempSrc: Oral   Oral  SpO2: 95%   100%  Weight: 91.2 kg (201 lb  1 oz)     Height:        Intake/Output Summary (Last 24 hours) at 07/17/17 1347 Last data filed at 07/17/17 1142  Gross per 24 hour  Intake                0 ml  Output             1925 ml  Net            -1925 ml   Filed Weights   07/15/17 0448 07/16/17 2237 07/17/17 0544  Weight: 95.9 kg (211 lb 6.4 oz) 90.8 kg (200 lb 3.2 oz) 91.2 kg (201 lb 1 oz)   Exam  General: Well developed, elderly, chronically ill appearing, NAD  HEENT: NCAT, mucous membranes moist. NG tube in place  Cardiovascular: S1 S2 auscultated, RRR, soft murmur  Respiratory: Clear to auscultation bilaterally, no wheezing   Abdomen: Soft, nontender, nondistended, + bowel sounds, dressing in place  Extremities: warm dry without cyanosis clubbing or edema  Neuro: Awake and alert, but will not answer questions regarding orientation. Does not interact much today.   Psych: Flat  Data Reviewed: I have personally reviewed following labs and imaging studies  CBC:  Recent Labs Lab 07/13/17 0430 07/14/17 0426 07/15/17 0408 07/16/17 0521 07/17/17 0428  WBC 11.0* 9.4 8.3 8.7 7.8  HGB 7.4* 7.1* 7.2* 7.3* 7.1*  HCT 24.3* 23.1* 23.5* 23.3* 23.2*  MCV 90.7 89.5 87.0 86.6 87.2  PLT 294 281 296 335 268   Basic Metabolic Panel:  Recent Labs Lab 07/11/17 0342 07/12/17 0417 07/13/17 0430 07/14/17 0426 07/15/17 0408 07/17/17 0428  NA 139 140 138 136 136 133*  K 3.9 3.7 4.1 4.1 4.0 3.8  CL 107 107 103 103 102 100*  CO2 27 27 29 26 29 27   GLUCOSE 133* 132* 152* 115* 100* 106*  BUN 12 11 11 13 9  5*  CREATININE 0.48 0.44 0.45 0.43* 0.38* 0.52  CALCIUM 7.8* 7.3* 8.0* 7.9* 8.1* 7.8*  MG 1.4* 1.6* 1.6*  --  1.5*  --    GFR: Estimated Creatinine Clearance: 81.7 mL/min (by C-G formula based on SCr of 0.52 mg/dL). Liver Function Tests: No results for input(s): AST, ALT, ALKPHOS, BILITOT, PROT, ALBUMIN in the last 168 hours. No results for input(s): LIPASE, AMYLASE in the last 168 hours. No results for  input(s): AMMONIA in the last 168 hours. Coagulation Profile: No results for input(s): INR, PROTIME in the last 168 hours. Cardiac Enzymes: No results for input(s): CKTOTAL, CKMB, CKMBINDEX, TROPONINI in the last 168 hours. BNP (last 3 results) No results for input(s): PROBNP in the last 8760 hours. HbA1C: No results for input(s): HGBA1C in the last 72 hours. CBG:  Recent Labs Lab 07/16/17 1211 07/16/17 1747 07/16/17 2138 07/17/17 0736 07/17/17 1158  GLUCAP 88 124* 104* 94 104*   Lipid Profile: No results for input(s): CHOL, HDL, LDLCALC, TRIG, CHOLHDL, LDLDIRECT in the last 72 hours. Thyroid Function Tests: No results for input(s): TSH, T4TOTAL, FREET4, T3FREE, THYROIDAB in the last 72 hours. Anemia Panel: No results for  input(s): VITAMINB12, FOLATE, FERRITIN, TIBC, IRON, RETICCTPCT in the last 72 hours. Urine analysis:    Component Value Date/Time   COLORURINE STRAW (A) 07/02/2017 1704   APPEARANCEUR CLEAR 07/02/2017 1704   LABSPEC 1.005 07/02/2017 1704   LABSPEC 1.010 08/17/2015 1542   PHURINE 8.0 07/02/2017 1704   GLUCOSEU NEGATIVE 07/02/2017 1704   GLUCOSEU 100 08/17/2015 1542   HGBUR SMALL (A) 07/02/2017 1704   BILIRUBINUR NEGATIVE 07/02/2017 1704   BILIRUBINUR Negative 08/17/2015 1542   KETONESUR NEGATIVE 07/02/2017 1704   PROTEINUR NEGATIVE 07/02/2017 1704   UROBILINOGEN 1.0 08/26/2015 0725   UROBILINOGEN 0.2 08/17/2015 1542   NITRITE NEGATIVE 07/02/2017 1704   LEUKOCYTESUR NEGATIVE 07/02/2017 1704   LEUKOCYTESUR Trace 08/17/2015 1542   Sepsis Labs: @LABRCNTIP (procalcitonin:4,lacticidven:4)  ) Recent Results (from the past 240 hour(s))  MRSA PCR Screening     Status: None   Collection Time: 07/11/17  8:58 AM  Result Value Ref Range Status   MRSA by PCR NEGATIVE NEGATIVE Final    Comment:        The GeneXpert MRSA Assay (FDA approved for NASAL specimens only), is one component of a comprehensive MRSA colonization surveillance program. It is  not intended to diagnose MRSA infection nor to guide or monitor treatment for MRSA infections.       Radiology Studies: No results found.   Scheduled Meds: . carbamazepine  150 mg Oral TID  . cloNIDine  0.1 mg Transdermal Weekly  . feeding supplement (ENSURE ENLIVE)  237 mL Oral TID BM  . feeding supplement (PRO-STAT SUGAR FREE 64)  30 mL Per Tube TID  . folic acid  1 mg Per Tube Daily  . insulin aspart  0-15 Units Subcutaneous TID WC  . insulin aspart  0-5 Units Subcutaneous QHS  . levETIRAcetam  1,000 mg Oral BID  . metoprolol tartrate  5 mg Intravenous Q6H  . mirtazapine  7.5 mg Oral QHS  . multivitamin with minerals  1 tablet Oral Daily  . potassium chloride  40 mEq Oral Daily   Continuous Infusions: . dextrose 5 % with kcl 40 mL/hr at 07/16/17 1856  . feeding supplement (JEVITY 1.2 CAL)       LOS: 24 days   Time Spent in minutes   30 minutes  Taylynn Easton D.O. on 07/17/2017 at 1:47 PM  Between 7am to 7pm - Pager - [phone]  After 7pm go to [email or web address] - password [email or web address]  And look for the night coverage person covering for me after hours  [facility] Office  [phone]

## 2017-07-17 NOTE — Progress Notes (Addendum)
Lori [name], Lori [name], Lori supplement provides 350 kcal and 20 grams of protein; MVI daily  Case discussed with Dr. Ree Kida. Oral intake remains inadequate. Plan to re-start TF to meet nutritional needs.   Pt remains with cortrak tube; Dr. Ree Kida agreeable to nocturnal feedings in attempt to continue to stimulate appetite. Pt likely not a surgical candidate for PEG. Per discussion with Dr. Ree Kida, [facility] referral is being pursued. Pt only consuming about 50% of nutritional needs, but that is heavily reliant on Prostat supplements via cortrak tube. Received verbal order from Dr. Ree Kida to re-start TF.   Day 1 Breakfast: 143 kcals, 2 grams protein Lunch: 85 kcals, [name] grams protein Dinner: 141 kcals, 5 grams protein Supplements: [name] Prostat supplements (300 kcals, 45 grams protein), 2 Ensure Enlive supplements (700 kcals, 40 grams protein  Total intake: 1369 kcal (72% of minimum estimated needs)  95 grams protein (24% of minimum estimated needs)  Day 2 Breakfast: 231 kcals, 10 grams protein Lunch: 277 kcals, [name] grams protein Dinner: n/a Supplements: refused Ensure supplements, 1 Prostat supplement (100 kcals, 15 grams protein)  Total intake: 608 kcal (32% of minimum estimated needs)  28 grams protein (24% of minimum estimated needs)  Average Total intake: 989 kcal (52% of minimum estimated needs)  62 grams protein (54% of minimum estimated needs)  Nutrition Dx: Inadequate oral intake related to lethargy/confusion, poor appetite as evidenced by meal completion < 25%; Ongoing  Goal: Patient will meet greater than or equal to 90% of their needs  Intervention:   -D/c Lori count -D/c Ensure Enlive po [name], Lori supplement provides 350 kcal and 20 grams of protein -D/c MVI -Liquid MVI -Initiate nocturnal feedings: Jevity 1.5 @ 50 ml/hr via cortrak tube   30 ml Prostat BID.    Tube  feeding regimen provides 1100 kcal (58% of needs), 68 grams of protein, and 456 ml of H2O.   Camri Molloy A. Jimmye Norman, RD, LDN, CDE Pager: [phone] After hours Pager: [phone]

## 2017-07-17 NOTE — Progress Notes (Signed)
Patient ID: Lori [name], female   DOB: 1951/05/23, 66 y.o.   MRN: [id number]  This NP visited patient at the bedside as a follow up to previous [place], and  for palliative medicine needs and emotional support from her family.  Lori [name] the patient's son and main decision maker was present at bedside, along with his wife who is an Therapist, sports.    Discussed with family although she has had some Improvement she remains high risk to decompensate secondary to her multiple co-morbidities.  She has begun to take in sips and bites but her current intake cannot support her nutritionally or from a hydration standpoint.  Family remains hopeful for continued improvement.  They are open to all offered and available medical interventions to prolong life.  Family tells me they are hopeful for transitional care hospital/ [facility] for continued aggressive medical interventions.  Questions and concerns addressed.    Discussed with patient the importance of continued conversation with family and their  medical providers regarding overall plan of care and treatment options,  ensuring decisions are within the context of the patients values and GOCs.  Palliative medicine team will continue to support holistically.  Time in  1300 AM      Time out  1335 AM      Total time spent on the unit was 35 minutes    Discussed with Dr Ree Kida  Greater than 50% of the time was spent in counseling and coordination of care  Wadie Lessen NP  Palliative Medicine Team Team Phone # [phone] Pager [phone]

## 2017-07-18 DIAGNOSIS — L89302 Pressure ulcer of unspecified buttock, stage 2: Secondary | ICD-10-CM

## 2017-07-18 DIAGNOSIS — L8990 Pressure ulcer of unspecified site, unspecified stage: Secondary | ICD-10-CM | POA: Insufficient documentation

## 2017-07-18 LAB — GLUCOSE, CAPILLARY
GLUCOSE-CAPILLARY: 139 mg/dL — AB (ref 65–99)
GLUCOSE-CAPILLARY: 98 mg/dL (ref 65–99)
Glucose-Capillary: 115 mg/dL — ABNORMAL HIGH (ref 65–99)
Glucose-Capillary: 106 mg/dL — ABNORMAL HIGH (ref 65–99)

## 2017-07-18 LAB — CBC
HCT: 23.9 % — ABNORMAL LOW (ref 36.0–46.0)
HEMOGLOBIN: 7.3 g/dL — AB (ref 12.0–15.0)
MCH: 26.6 pg (ref 26.0–34.0)
MCHC: 30.5 g/dL (ref 30.0–36.0)
MCV: 87.2 fL (ref 78.0–100.0)
Platelets: 365 10*3/uL (ref 150–400)
RBC: 2.74 MIL/uL — AB (ref 3.87–5.11)
RDW: 17.1 % — ABNORMAL HIGH (ref 11.5–15.5)
WBC: 8.4 10*3/uL (ref 4.0–10.5)

## 2017-07-18 LAB — BASIC METABOLIC PANEL
ANION GAP: 7 (ref 5–15)
BUN: 7 mg/dL (ref 6–20)
CHLORIDE: 99 mmol/L — AB (ref 101–111)
CO2: 28 mmol/L (ref 22–32)
Calcium: 7.8 mg/dL — ABNORMAL LOW (ref 8.9–10.3)
Creatinine, Ser: 0.49 mg/dL (ref 0.44–1.00)
GFR calc Af Amer: >60 mL/min (ref >60)
GFR calc non Af Amer: >60 mL/min (ref >60)
GLUCOSE: 166 mg/dL — AB (ref 65–99)
POTASSIUM: 4 mmol/L (ref 3.5–5.1)
SODIUM: 134 mmol/L — AB (ref 135–145)

## 2017-07-18 MED ORDER — PANCRELIPASE (LIP-PROT-AMYL) 12000-38000 UNITS PO CPEP
24000.0000 [IU] | ORAL_CAPSULE | Freq: Once | ORAL | Status: DC
  Filled 2017-07-18: qty 2

## 2017-07-18 MED ORDER — METOPROLOL TARTRATE 25 MG PO TABS
25.0000 mg | ORAL_TABLET | Freq: Two times a day (BID) | ORAL | Status: DC
  Administered 2017-07-18 – 2017-07-21 (×5): 25 mg via ORAL
  Filled 2017-07-18 (×5): qty 1

## 2017-07-18 MED ORDER — SODIUM BICARBONATE 650 MG PO TABS
650.0000 mg | ORAL_TABLET | Freq: Once | ORAL | Status: DC
  Filled 2017-07-18: qty 1

## 2017-07-18 NOTE — Progress Notes (Signed)
Patient ID: Lori [name], female   DOB: 09-Sep-1951, 66 y.o.   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 Seen by oncology. Not thought to be a candidate for any treatment. Then she had what appears to be a seizure resulting in encephalopathy. Neurology was consulted. Palliative medicine continues to follow. Family still desires aggressive care. Patient was transferred to stepdown unit. Critical care medicine was consulted. Patient noted to have profuse urine output. 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 Mental status improving. Had MBS, started on dysphagia 2 diet however still poor oral intake, therefore NGT still in place. Patient transitioned to oral Keppra, Tegretol. Mental status improving. Neurology felt no need for lumbar puncture as mental status has been improving. 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[place])   HTN (hypertension)   Seizure ([place])   Peritoneal carcinomatosis ([place])   Intra-abdominal abscess ([place])   DNR (do not resuscitate) discussion   Advance care planning   Palliative care by specialist   Encephalopathy   Metastatic cancer ([place])   Intra-abdominal fluid collection   Cerebrovascular accident (CVA) due to thrombosis of precerebral artery ([place])   Adult failure to thrive   Acute metabolic encephalopathy/Left Thalamic Stroke/ Bilateral small SDH -Patient was noted to have garbled speech on the afternoon of 06/28/2017 -Neurology consultation  appreciated -CT head on 8/1: Evolving encephalomalacia at suspected left thalamic lacunar infarct. -MRI brain on 7/29: New tiny bilateral subdural hematomas, trace subarachnoid hemorrhage, punctate focus of diffusion abnormality in the dorsal thalamus -It was thought the patient was having seizure activity and she was loaded with Keppra -Patient has had continuous EEG monitoring: Progressively improving diffuse encephalopathy  -Neurology felt lumbar puncture is no longer necessary given that her mental status is improving -Patient had modified barium swallow, was placed on dysphagia 2 diet -Mental Status is improving  History of seizure disorder - on oral Tegretol and Keppra  Diabetes insipidus/polyuria -Patient had excessive urine output thought to be due to central diabetes insipidus due to abnormalities noted on MRI of the brain -Sodium level was also elevated with high serum osmolality 100 -She was given DDAVP  -sodium is 134 today  Primary appendiceal carcinoma with metastatic disease, stage IV with abdominal wound, status post treatment of intra-abdominal abscess -Oncology consultation appreciated, patient is not a candidate for chemotherapy and is thought to have an incurable condition. Poor prognosis -Palliative care following -had recent SBO due to carcinomatosis status post  laparotomy, drainage of intraabdominal abscess, repair of small bowel perforation. Urine culture grew enterococcus which was treated with IV Unasyn, completed course of antibiotics -Gen. Surgery following- recommended TID wet to dry dressing changes   Rectal bleeding of unknown cause -Appears to be stable, patient is not a candidate for endoscopic evaluation due to recent surgery. This was discussed by previous hospitalist with general surgery.  Anemia secondary to acute blood loss -Hemoglobin currently 7.3, appears stable -transfuse if hemoglobin <7 -Continue to monitor CBC   Intra-abdominal  abscess Completed IV Unasyn therapy  Diabetes mellitus, type II -Continue insulin sliding scale CBG monitoring  Essential hypertension -Continue clonidine, hydralazine as needed  Enterococcus UTI -As above, patient was on Zosyn however transitioned to and completed Unasyn   Nutrition -Appetite still very poor. Continue tube feeds.  Hypokalemia/hypomagnesemia -Resolved, continue to monitor and replace as needed  Goals of care -Palliative care was consulted and appreciated however family wishes to continue aggressive care Follow further recommendations from palliative care  -Feel patient has an overall poor prognosis as she has had poor oral intake and stage IV appendiceal cancer  Stage II Pressure ulcer of buttocks - follow wound care recommendations   DVT Prophylaxis  SCDs  Code Status: Full  Family Communication: None at bedside  Disposition Plan:  unclear at this time Pending improvement in oral intake. SNF vs LTAC  Consultants PCCM General surgery Neurology  Oncology Palliative care  Procedures  Continuous EEG Antimicrobials: Anti-infectives    Start     Dose/Rate Route Frequency Ordered Stop   07/04/17 1100  Ampicillin-Sulbactam (UNASYN) 3 g in sodium chloride 0.9 % 100 mL IVPB     3 g 200 mL/hr over 30 Minutes Intravenous Every 6 hours 07/04/17 0823 07/10/17 0429   07/03/17 1600  Ampicillin-Sulbactam (UNASYN) 3 g in sodium chloride 0.9 % 100 mL IVPB  Status:  Discontinued     3 g 200 mL/hr over 30 Minutes Intravenous Every 6 hours 07/03/17 1047 07/04/17 0823   06/29/17 1400  ceFEPIme (MAXIPIME) 2 g in dextrose 5 % 50 mL IVPB  Status:  Discontinued     2 g 100 mL/hr over 30 Minutes Intravenous Every 8 hours 06/29/17 0853 06/29/17 0857   06/29/17 1000  ceFEPIme (MAXIPIME) 2 g in dextrose 5 % 50 mL IVPB  Status:  Discontinued     2 g 100 mL/hr over 30 Minutes Intravenous Every 8 hours 06/29/17 0857 07/03/17 1046   06/29/17 0930  metroNIDAZOLE  (FLAGYL) IVPB 500 mg  Status:  Discontinued     500 mg 100 mL/hr over 60 Minutes Intravenous Every 8 hours 06/29/17 0838 06/30/17 0956   06/24/17 0600  piperacillin-tazobactam (ZOSYN) IVPB 3.375 g  Status:  Discontinued     3.375 g 12.5 mL/hr over 240 Minutes Intravenous Every 8 hours 06/23/17 2254 06/29/17 0838   06/23/17 2300  piperacillin-tazobactam (ZOSYN) IVPB 3.375 g     3.375 g 100 mL/hr over 30 Minutes Intravenous  Once 06/23/17 2254 06/24/17 0056       Subjective: Patient seen and examined at bedside. She is awake and answers some questions but she is still slightly confused. No overnight fever, vomiting.  Objective: Vitals:   07/17/17 2202 07/18/17 0055 07/18/17 0533 07/18/17 1219  BP: (!) 145/75 (!) 158/85 (!) 157/82 (!) 169/89  Pulse: 91 (!) 106 (!) 101 97  Resp: 18  18   Temp: 98.3 F (36.8 C)  98.4 F (36.9 C)   TempSrc: Oral  Oral   SpO2: 100%  99%   Weight:   88.6 kg (195 lb 5.2 oz)   Height:        Intake/Output Summary (Last 24 hours) at 07/18/17 1332 Last data filed at 07/17/17 2210  Gross per 24 hour  Intake                0 ml  Output              450 ml  Net             -450 ml   Filed Weights   07/16/17 2237 07/17/17 0544  07/18/17 0533  Weight: 90.8 kg (200 lb 3.2 oz) 91.2 kg (201 lb 1 oz) 88.6 kg (195 lb 5.2 oz)    Examination:  General exam: Appears calm and comfortable But still slightly confused Respiratory system: Bilateral decreased breath sound at bases with scattered crackles Cardiovascular system: S1 & S2 heard, rate controlled  Gastrointestinal system: Abdomen is nondistended, soft and nontender. Normal bowel sounds heard. Dressing in place Extremities: No cyanosis, clubbing; trace edema   Data Reviewed: I have personally reviewed following labs and imaging studies  CBC:  Recent Labs Lab 07/14/17 0426 07/15/17 0408 07/16/17 0521 07/17/17 0428 07/18/17 0404  WBC 9.4 8.3 8.7 7.8 8.4  HGB 7.1* 7.2* 7.3* 7.1* 7.3*  HCT  23.1* 23.5* 23.3* 23.2* 23.9*  MCV 89.5 87.0 86.6 87.2 87.2  PLT 281 296 335 343 007   Basic Metabolic Panel:  Recent Labs Lab 07/12/17 0417 07/13/17 0430 07/14/17 0426 07/15/17 0408 07/17/17 0428 07/18/17 0404  NA 140 138 136 136 133* 134*  K 3.7 4.1 4.1 4.0 3.8 4.0  CL 107 103 103 102 100* 99*  CO2 27 29 26 29 27 28   GLUCOSE 132* 152* 115* 100* 106* 166*  BUN 11 11 13 9  5* 7  CREATININE 0.44 0.45 0.43* 0.38* 0.52 0.49  CALCIUM 7.3* 8.0* 7.9* 8.1* 7.8* 7.8*  MG 1.6* 1.6*  --  1.5*  --   --    GFR: Estimated Creatinine Clearance: 80.6 mL/min (by C-G formula based on SCr of 0.49 mg/dL). Liver Function Tests: No results for input(s): AST, ALT, ALKPHOS, BILITOT, PROT, ALBUMIN in the last 168 hours. No results for input(s): LIPASE, AMYLASE in the last 168 hours. No results for input(s): AMMONIA in the last 168 hours. Coagulation Profile: No results for input(s): INR, PROTIME in the last 168 hours. Cardiac Enzymes: No results for input(s): CKTOTAL, CKMB, CKMBINDEX, TROPONINI in the last 168 hours. BNP (last 3 results) No results for input(s): PROBNP in the last 8760 hours. HbA1C: No results for input(s): HGBA1C in the last 72 hours. CBG:  Recent Labs Lab 07/17/17 1158 07/17/17 1651 07/17/17 2159 07/18/17 0758 07/18/17 1210  GLUCAP 104* 141* 115* 139* 115*   Lipid Profile: No results for input(s): CHOL, HDL, LDLCALC, TRIG, CHOLHDL, LDLDIRECT in the last 72 hours. Thyroid Function Tests: No results for input(s): TSH, T4TOTAL, FREET4, T3FREE, THYROIDAB in the last 72 hours. Anemia Panel: No results for input(s): VITAMINB12, FOLATE, FERRITIN, TIBC, IRON, RETICCTPCT in the last 72 hours. Sepsis Labs: No results for input(s): PROCALCITON, LATICACIDVEN in the last 168 hours.  Recent Results (from the past 240 hour(s))  MRSA PCR Screening     Status: None   Collection Time: 07/11/17  8:58 AM  Result Value Ref Range Status   MRSA by PCR NEGATIVE NEGATIVE Final     Comment:        The GeneXpert MRSA Assay (FDA approved for NASAL specimens only), is one component of a comprehensive MRSA colonization surveillance program. It is not intended to diagnose MRSA infection nor to guide or monitor treatment for MRSA infections.          Radiology Studies: No results found.      Scheduled Meds: . carbamazepine  150 mg Oral TID  . cloNIDine  0.1 mg Transdermal Weekly  . feeding supplement (ENSURE ENLIVE)  237 mL Oral TID BM  . feeding supplement (JEVITY 1.5 CAL/FIBER)  1,000 mL Per Tube Q24H  . feeding supplement (PRO-STAT SUGAR FREE 64)  30 mL  Per Tube BID  . folic acid  1 mg Per Tube Daily  . insulin aspart  0-15 Units Subcutaneous TID WC  . insulin aspart  0-5 Units Subcutaneous QHS  . levETIRAcetam  1,000 mg Oral BID  . metoprolol tartrate  5 mg Intravenous Q6H  . mirtazapine  7.5 mg Oral QHS  . multivitamin  15 mL Per Tube Daily  . potassium chloride  40 mEq Oral Daily   Continuous Infusions: . dextrose 5 % with kcl 40 mL/hr at 07/17/17 1940     LOS: 25 days        Aline August, MD [facility] Pager [phone]  If 7PM-7AM, please contact night-coverage [email or web address] Password [email or web address] 07/18/2017, 1:32 PM

## 2017-07-18 NOTE — Progress Notes (Signed)
Subjective/Chief Complaint: No new complaints  Resting comfortably Able to carry on conversation + BM On tube feeds   Objective: Vital signs in last 24 hours: Temp:  [98.3 F (36.8 C)-98.8 F (37.1 C)] 98.4 F (36.9 C) (08/15 0533) Pulse Rate:  [90-106] 101 (08/15 0533) Resp:  [16-18] 18 (08/15 0533) BP: (131-158)/(75-85) 157/82 (08/15 0533) SpO2:  [99 %-100 %] 99 % (08/15 0533) Weight:  [88.6 kg (195 lb 5.2 oz)] 88.6 kg (195 lb 5.2 oz) (08/15 0533) Last BM Date: 07/17/17  Intake/Output from previous day: 08/14 0701 - 08/15 0700 In: -  Out: 062 [Urine:875] Intake/Output this shift: No intake/output data recorded.  Wound - granulating more, but still with some fibrinous slough; no surrounding cellulitis  Lab Results:   Recent Labs  07/17/17 0428 07/18/17 0404  WBC 7.8 8.4  HGB 7.1* 7.3*  HCT 23.2* 23.9*  PLT 343 365   BMET  Recent Labs  07/17/17 0428 07/18/17 0404  NA 133* 134*  K 3.8 4.0  CL 100* 99*  CO2 27 28  GLUCOSE 106* 166*  BUN 5* 7  CREATININE 0.52 0.49  CALCIUM 7.8* 7.8*   PT/INR No results for input(s): LABPROT, INR in the last 72 hours. ABG No results for input(s): PHART, HCO3 in the last 72 hours.  Invalid input(s): PCO2, PO2  Studies/Results: No results found.  Anti-infectives: Anti-infectives    Start     Dose/Rate Route Frequency Ordered Stop   07/04/17 1100  Ampicillin-Sulbactam (UNASYN) 3 g in sodium chloride 0.9 % 100 mL IVPB     3 g 200 mL/hr over 30 Minutes Intravenous Every 6 hours 07/04/17 0823 07/10/17 0429   07/03/17 1600  Ampicillin-Sulbactam (UNASYN) 3 g in sodium chloride 0.9 % 100 mL IVPB  Status:  Discontinued     3 g 200 mL/hr over 30 Minutes Intravenous Every 6 hours 07/03/17 1047 07/04/17 0823   06/29/17 1400  ceFEPIme (MAXIPIME) 2 g in dextrose 5 % 50 mL IVPB  Status:  Discontinued     2 g 100 mL/hr over 30 Minutes Intravenous Every 8 hours 06/29/17 0853 06/29/17 0857   06/29/17 1000  ceFEPIme  (MAXIPIME) 2 g in dextrose 5 % 50 mL IVPB  Status:  Discontinued     2 g 100 mL/hr over 30 Minutes Intravenous Every 8 hours 06/29/17 0857 07/03/17 1046   06/29/17 0930  metroNIDAZOLE (FLAGYL) IVPB 500 mg  Status:  Discontinued     500 mg 100 mL/hr over 60 Minutes Intravenous Every 8 hours 06/29/17 0838 06/30/17 0956   06/24/17 0600  piperacillin-tazobactam (ZOSYN) IVPB 3.375 g  Status:  Discontinued     3.375 g 12.5 mL/hr over 240 Minutes Intravenous Every 8 hours 06/23/17 2254 06/29/17 0838   06/23/17 2300  piperacillin-tazobactam (ZOSYN) IVPB 3.375 g     3.375 g 100 mL/hr over 30 Minutes Intravenous  Once 06/23/17 2254 06/24/17 0056      Assessment/Plan: Diabetes insipidus Seizure disorder Lower GI bleed - resolved Anemia - Hg 7.3,stable DM-II HTN Enterococcus UTI - completed unasyn Acute metabolic encephalopathy/Left Thalamic Stroke/ Bilateral small SDH Malnutrition - started on dysphagia diet 8/7, but due to poor PO intake TF is being continued  Primary appendiceal carcinoma with metastatic disease, stage IV  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 [name]: 1. 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  2. Exploratory laparotomy, drainage of abdominal abscess, repair of small bowel perforation,  repair of small bowel enterotomy, enterocolostomy (mid ileum to mid transverse colon). - tolerating TF and having bowel function  FEN- DYS III, TF VTE- SCDs, No anticoagulants due to anemia ID- Zosyn 7/21>>7/27, Flagyl 7/27>>7/28, Cefipime 7/27>>7/27, Unasyn 7/31>>8/7  Plan: TIDwet to dry dressing changes to abdominal wound.  LOS: 25 days    Herby Amick K. 07/18/2017

## 2017-07-18 NOTE — Progress Notes (Signed)
Physical Therapy Treatment Patient Details Name: Lori [name] MRN: [id number] DOB: [birth year] Today's Date: 07/18/2017    History of Present Illness pt is a 66 y/o female with pmh significant for DM , Seizure d/o HTN, h/o brain aneurysm, cervical CA, metastatic mucinous adenocarcinoma of the appendix, admitted from SNF with c/o bloody bowel movements.  In hospital pt appeared to have seizure resulting in encephalopathy.  CT/MRI's suggest L thalamic, L cerebellar and right frontal lobe encephalomalacia    PT Comments    Slow to improve.  Pt takes a while to wake up.  Emphasis on LE ROM exercise with graded resistance to get pt ready to participat.  Rolled, transitioned to sit, sat EOB >=15 min working on sitting balance, and truncal activation.   Follow Up Recommendations  SNF;Supervision/Assistance - 24 hour     Equipment Recommendations  Wheelchair (measurements PT)    Recommendations for Other Services       Precautions / Restrictions Precautions Precautions: Fall Precaution Comments: rectal pouch, foley    Mobility  Bed Mobility Overal bed mobility: Needs Assistance Bed Mobility: Supine to Sit;Sit to Sidelying Rolling: Mod assist   Supine to sit: Total assist (+2 not available)   Sit to sidelying: Total assist General bed mobility comments: pt assisted with R UE, but fatigued quickly  Transfers                    Ambulation/Gait             General Gait Details: unable   Stairs            Wheelchair Mobility    Modified Rankin (Stroke Patients Only)       Balance Overall balance assessment: Needs assistance Sitting-balance support: Single extremity supported Sitting balance-Leahy Scale: Poor Sitting balance - Comments: sat EOB x15 min working on sitting balance, upright midline posture, kicking each leg out while maintaing the feilsa Postural control: Posterior lean;Left lateral lean                                   Cognition Arousal/Alertness: Awake/alert;Lethargic Behavior During Therapy: Flat affect Overall Cognitive Status: Impaired/Different from baseline Area of Impairment: Problem solving                       Following Commands: Follows one step commands inconsistently;Follows one step commands with increased time     Problem Solving: Slow processing;Requires verbal cues;Requires tactile cues        Exercises General Exercises - Lower Extremity Hip Flexion/Marching: AROM;AAROM;Strengthening;Both;10 reps;Supine (graded resistance)    General Comments        Pertinent Vitals/Pain Pain Assessment: No/denies pain    Home Living Family/patient expects to be discharged to:: Skilled nursing facility Living Arrangements: Children Available Help at Discharge: Family Type of Home: House     Home Layout: One level Home Equipment: Kasandra Knudsen - single point;Walker - 4 wheels;Toilet riser      Prior Function Level of Independence: Independent    ADL's / Homemaking Assistance Needed: Assist by nursing     PT Goals (current goals can now be found in the care plan section) Acute Rehab PT Goals PT Goal Formulation: With patient Time For Goal Achievement: 07/23/17 Potential to Achieve Goals: Fair Progress towards PT goals: Progressing toward goals    Frequency    Min 2X/week      PT Plan  Co-evaluation              AM-PAC PT "6 Clicks" Daily Activity  Outcome Measure  Difficulty turning over in bed (including adjusting bedclothes, sheets and blankets)?: Total Difficulty moving from lying on back to sitting on the side of the bed? : Total Difficulty sitting down on and standing up from a chair with arms (e.g., wheelchair, bedside commode, etc,.)?: Total Help needed moving to and from a bed to chair (including a wheelchair)?: Total Help needed walking in hospital room?: Total Help needed climbing 3-5 steps with a railing? : Total 6 Click Score: 6     End of Session   Activity Tolerance: Patient tolerated treatment well;Patient limited by lethargy;Patient limited by fatigue Patient left: with call bell/phone within reach;in chair;with family/visitor present   PT Visit Diagnosis: Unsteadiness on feet (R26.81);Other abnormalities of gait and mobility (R26.89);Other symptoms and signs involving the nervous system (R29.898) Pain - Right/Left: Left     Time: 4917-9150 PT Time Calculation (min) (ACUTE ONLY): 28 min  Charges:  $Therapeutic Activity: 23-37 mins                    G Codes:       07-22-2017  Donnella Sham, PT [phone]  (pager)   Tessie Fass Tayonna Bacha Jul 22, 2017, 3:21 PM

## 2017-07-19 LAB — BASIC METABOLIC PANEL
Anion gap: 7 (ref 5–15)
BUN: 10 mg/dL (ref 6–20)
CHLORIDE: 100 mmol/L — AB (ref 101–111)
CO2: 29 mmol/L (ref 22–32)
CREATININE: 0.46 mg/dL (ref 0.44–1.00)
Calcium: 7.8 mg/dL — ABNORMAL LOW (ref 8.9–10.3)
Glucose, Bld: 124 mg/dL — ABNORMAL HIGH (ref 65–99)
POTASSIUM: 4.1 mmol/L (ref 3.5–5.1)
SODIUM: 136 mmol/L (ref 135–145)

## 2017-07-19 LAB — CBC WITH DIFFERENTIAL/PLATELET
Basophils Absolute: 0 10*3/uL (ref 0.0–0.1)
Basophils Relative: 0 %
Eosinophils Absolute: 0.3 10*3/uL (ref 0.0–0.7)
Eosinophils Relative: 4 %
HEMATOCRIT: 24.5 % — AB (ref 36.0–46.0)
HEMOGLOBIN: 7.5 g/dL — AB (ref 12.0–15.0)
LYMPHS ABS: 0.9 10*3/uL (ref 0.7–4.0)
LYMPHS PCT: 11 %
MCH: 26.8 pg (ref 26.0–34.0)
MCHC: 30.6 g/dL (ref 30.0–36.0)
MCV: 87.5 fL (ref 78.0–100.0)
Monocytes Absolute: 0.7 10*3/uL (ref 0.1–1.0)
Monocytes Relative: 8 %
NEUTROS PCT: 77 %
Neutro Abs: 6.7 10*3/uL (ref 1.7–7.7)
Platelets: 384 10*3/uL (ref 150–400)
RBC: 2.8 MIL/uL — AB (ref 3.87–5.11)
RDW: 17 % — ABNORMAL HIGH (ref 11.5–15.5)
WBC: 8.7 10*3/uL (ref 4.0–10.5)

## 2017-07-19 LAB — GLUCOSE, CAPILLARY
GLUCOSE-CAPILLARY: 121 mg/dL — AB (ref 65–99)
GLUCOSE-CAPILLARY: 136 mg/dL — AB (ref 65–99)
Glucose-Capillary: 120 mg/dL — ABNORMAL HIGH (ref 65–99)
Glucose-Capillary: 145 mg/dL — ABNORMAL HIGH (ref 65–99)

## 2017-07-19 LAB — MAGNESIUM: MAGNESIUM: 1.3 mg/dL — AB (ref 1.7–2.4)

## 2017-07-19 MED ORDER — MAGNESIUM SULFATE 2 GM/50ML IV SOLN
2.0000 g | Freq: Once | INTRAVENOUS | Status: AC
  Administered 2017-07-19: 2 g via INTRAVENOUS
  Filled 2017-07-19: qty 50

## 2017-07-19 NOTE — Care Management Important Message (Signed)
Important Message  Patient Details  Name: Lori [name] MRN: [id number] Date of Birth: February 09, 1951   Medicare Important Message Given:  Yes    [name] May 07/19/2017, 10:44 AM

## 2017-07-19 NOTE — Progress Notes (Signed)
LCSW covering the evening received call from patient's insurance company: [facility] with [employer] who was requesting additional information regarding patient's needs for insurance authorization.  LCSW provided clinical overview via phone and submitted additional paperwork as requested. Chip will follow up with unit CSW tomorrow (Friday) no other needs at this time.  Lane Hacker, MSW Clinical Social Work: [employer] Coverage for :  [phone]

## 2017-07-19 NOTE — NC FL2 (Signed)
[place] LEVEL OF CARE SCREENING TOOL     IDENTIFICATION  Patient Name: Lori [name] Birthdate: 04/17/1951 Sex: female Admission Date (Current Location): 06/23/2017  [facility] and [place] Number:  Herbalist and Address:  [facility], [place] [address] [place], [place] [address]      Provider Number: [id number]  Attending Physician Name and Address:  Aline August, MD  Relative Name and Phone Number:       Current Level of Care: Hospital Recommended Level of Care: [place] Prior Approval Number:    Date Approved/Denied:   PASRR Number: [id number]  Discharge Plan: SNF    Current Diagnoses: Patient Active Problem List   Diagnosis Date Noted  . Pressure injury of skin 07/18/2017  . Adult failure to thrive   . Intra-abdominal fluid collection   . Cerebrovascular accident (CVA) due to thrombosis of precerebral artery ([place])   . Metastatic cancer ([place])   . Encephalopathy   . DNR (do not resuscitate) discussion   . Advance care planning   . Palliative care by specialist   . Intra-abdominal abscess ([place]) 06/23/2017  . Gastrointestinal hemorrhage with melena   . Hypokalemia   . Severe sepsis with septic shock ([place])   . Peritoneal carcinomatosis ([place])   . Seizure ([place])   . SBO (small bowel obstruction) ([place]) 05/15/2017  . HTN (hypertension) 05/15/2017  . Pulmonary nodule 03/26/2017  . 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/24/2015    Orientation RESPIRATION BLADDER Height & Weight     Self, Time, Situation, Place  Normal Incontinent Weight: (!) 171.7 kg (378 lb 8.5 oz) (seizure pads and SCD removed ) Height:  5\' 8"  (172.7 cm)  BEHAVIORAL SYMPTOMS/MOOD NEUROLOGICAL BOWEL NUTRITION STATUS      Incontinent Diet (Please see DC Summary)  AMBULATORY STATUS COMMUNICATION OF NEEDS  Skin   Extensive Assist Verbally PU Stage and Appropriate Care (Stage II pressure injury on buttocks;closed incision on abdomen)                       Personal Care Assistance Level of Assistance  Bathing, Dressing Bathing Assistance: Maximum assistance Feeding assistance: Independent Dressing Assistance: Maximum assistance     Functional Limitations Info  Sight, Hearing, Speech Sight Info: Adequate Hearing Info: Adequate Speech Info: Adequate    SPECIAL CARE FACTORS FREQUENCY  PT (By licensed PT)     PT Frequency: 5x/week OT Frequency: 5/wk            Contractures Contractures Info: Not present    Additional Factors Info  Insulin Sliding Scale Code Status Info: Full Allergies Info: Contrast Media Iodinated Diagnostic Agents, Dilantin Phenytoin Sodium Extended, Latex, Adhesive Tape   Insulin Sliding Scale Info: 3x daily with meals and at bedtime       Current Medications (07/19/2017):  This is the current hospital active medication list Current Facility-Administered Medications  Medication Dose Route Frequency Provider Last Rate Last Dose  . alum & mag hydroxide-simeth (MAALOX/MYLANTA) 200-200-20 MG/5ML suspension 30 mL  30 mL Oral Q6H PRN Sid Falcon, MD   30 mL at 07/09/17 2020  . carbamazepine (TEGRETOL) chewable tablet 150 mg  150 mg Oral TID Amie Portland, MD   150 mg at 07/19/17 1031  . cloNIDine (CATAPRES - Dosed in mg/24 hr) patch 0.1 mg  0.1 mg Transdermal Weekly Roney Jaffe,  MD   0.1 mg at 07/18/17 0932  . feeding supplement (ENSURE ENLIVE) (ENSURE ENLIVE) liquid 237 mL  237 mL Oral TID BM Mikhail, Maryann, DO   237 mL at 07/19/17 1103  . feeding supplement (JEVITY 1.5 CAL/FIBER) liquid 1,000 mL  1,000 mL Per Tube Q24H Mikhail, Velta [place], DO   1,000 mL at 07/18/17 2157  . feeding supplement (PRO-STAT SUGAR FREE 64) liquid 30 mL  30 mL Per Tube BID Cristal Ford, DO   30 mL at 07/19/17 1032  . folic acid (FOLVITE) tablet 1 mg  1 mg Per Tube Daily  Rai, Ripudeep K, MD   1 mg at 07/19/17 1000  . hydrALAZINE (APRESOLINE) injection 10 mg  10 mg Intravenous Q6H PRN Bonnielee Haff, MD   10 mg at 07/02/17 0932  . insulin aspart (novoLOG) injection 0-15 Units  0-15 Units Subcutaneous TID [facility] Cristal Ford, DO   2 Units at 07/19/17 0848  . insulin aspart (novoLOG) injection 0-5 Units  0-5 Units Subcutaneous QHS Mikhail, Velta [place], DO      . levETIRAcetam (KEPPRA) tablet 1,000 mg  1,000 mg Oral BID Rai, Ripudeep K, MD   1,000 mg at 07/19/17 1032  . lipase/protease/amylase (CREON) capsule 24,000 Units  24,000 Units Oral Once Aline August, MD       And  . sodium bicarbonate tablet 650 mg  650 mg Oral Once Aline August, MD      . metoprolol tartrate (LOPRESSOR) tablet 25 mg  25 mg Oral BID Aline August, MD   25 mg at 07/19/17 1032  . mirtazapine (REMERON) tablet 7.5 mg  7.5 mg Oral QHS Mikhail, Frederic, DO   7.5 mg at 07/18/17 2157  . multivitamin liquid 15 mL  15 mL Per Tube Daily Mikhail, Maryann, DO   15 mL at 07/19/17 1000  . ondansetron (ZOFRAN) tablet 4 mg  4 mg Oral Q6H PRN Sid Falcon, MD       Or  . ondansetron [name]) injection 4 mg  4 mg Intravenous Q6H PRN Sid Falcon, MD      . RESOURCE THICKENUP CLEAR   Oral PRN Rai, Ripudeep K, MD      . sodium chloride flush (NS) 0.9 % injection 10-40 mL  10-40 mL Intracatheter PRN Nita Sells, MD   10 mL at 07/17/17 0409   Facility-Administered Medications Ordered in Other Encounters  Medication Dose Route Frequency Provider Last Rate Last Dose  . sodium chloride flush (NS) 0.9 % injection 10 mL  10 mL Intravenous PRN Baird Cancer, PA-C   10 mL at 03/26/17 1050     Discharge Medications: Please see discharge summary for a list of discharge medications.  Relevant Imaging Results:  Relevant Lab Results:   Additional Information SS # [id number]  Benard Halsted, LCSWA

## 2017-07-19 NOTE — Progress Notes (Signed)
  Speech Language Pathology Treatment: Dysphagia  Patient Details Name: Lori [name] MRN: [id number] DOB: 10-13-1951 Today's Date: 07/19/2017 Time: 6553-7482 SLP Time Calculation (min) (ACUTE ONLY): 15 min  Assessment / Plan / Recommendation Clinical Impression  Pt continues to have poor intake, but swallowing has improved to a level of functionality with no s/s of aspiration, and sufficient mastication of dysphagia 3 solids. Pt consumed limited POs with clinician today. Issues are related primarily to poor motivation to eat, impaired attention secondary to CVA.   Pt continues with NG in place; comfort with eating may certainly improve with removal of tube.  No further SLP needs are identified at this point- our services will sign off.   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 She was recently hospitalized for small bowel obstruction and underwent diagnostic laparoscopy in June. She underwent repair of small bowel perforation. She had a prolonged hospital stay. Subsequently was discharged to skilled nursing facility. Presented back with rectal bleeding. Seen by oncology. Not thought to be a candidate for any treatment. Then she had what appeared to be a seizure resulting in encephalopathy. Neurology was consulted; pt with acute left thalamic CVA, bilateral small SDH. Initially evaluated by SLP 06/29/17 with recommendations for dys 2, thin liquids, however pt made NPO with cortrak placed per MD with change in MS, SLP s/o on 7/31 as pt remained unresponsive.       SLP Plan  All goals met       Recommendations  Diet recommendations: Dysphagia 3 (mechanical soft);Thin liquid Liquids provided via: Cup;Straw Medication Administration: Whole meds with  puree Supervision: Patient able to self feed;Full supervision/cueing for compensatory strategies Compensations: Minimize environmental distractions;Slow rate;Small sips/bites Postural Changes and/or Swallow Maneuvers: Seated upright 90 degrees                Oral Care Recommendations: Oral care BID Follow up Recommendations: Skilled Nursing facility Plan: All goals met       GO                Juan Quam Laurice 07/19/2017, 1:23 PM

## 2017-07-19 NOTE — Progress Notes (Signed)
Patient ID: Lori [name], female   DOB: 04-22-1951, 66 y.o.   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 Seen by oncology. Not thought to be a candidate for any treatment. Then she had what appears to be a seizure resulting in encephalopathy. Neurology was consulted. Palliative medicine continues to follow. Family still desires aggressive care. Patient was transferred to stepdown unit. Critical care medicine was consulted. Patient noted to have profuse urine output. 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 Mental status improving. Had MBS, started on dysphagia 2 diet however still poor oral intake, therefore NGT still in place. Patient transitioned to oral Keppra, Tegretol. Mental status improving. Neurology felt no need for lumbar puncture as mental status has been improving. 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[place])   HTN (hypertension)   Seizure ([place])   Peritoneal carcinomatosis ([place])   Intra-abdominal abscess ([place])   DNR (do not resuscitate) discussion   Advance care planning   Palliative care by specialist   Encephalopathy   Metastatic cancer ([place])   Intra-abdominal fluid collection   Cerebrovascular accident (CVA) due to thrombosis of precerebral artery ([place])   Adult failure to thrive   Pressure injury of skin   Acute metabolic encephalopathy/Left Thalamic Stroke/ Bilateral small SDH -Patient was noted to have garbled speech on the afternoon of  06/28/2017 -Neurology consultation appreciated -CT head on 8/1: Evolving encephalomalacia at suspected left thalamic lacunar infarct. -MRI brain on 7/29: New tiny bilateral subdural hematomas, trace subarachnoid hemorrhage, punctate focus of diffusion abnormality in the dorsal thalamus -It was thought the patient was having seizure activity and she was loaded with Keppra -Patient has had continuous EEG monitoring: Progressively improving diffuse encephalopathy  -Neurology felt lumbar puncture is no longer necessary given that her mental status is improving -Patient had modified barium swallow, was placed on dysphagia 2 diet -Mental Status is improving: she is more awake  History of seizure disorder - on oral Tegretol and Keppra  Diabetes insipidus/polyuria -Patient had excessive urine output thought to be due to central diabetes insipidus due to abnormalities noted on MRI of the brain -Sodium level was also elevated with high serum osmolality 100 -She was given DDAVP  -sodium is 136 today  Primary appendiceal carcinoma with metastatic disease, stage IV with abdominal wound, status post treatment of intra-abdominal abscess -Oncology consultation appreciated, patient is not a candidate for chemotherapy and is thought to have an incurable condition. Poor prognosis -Palliative care following -had recent SBO due to carcinomatosis status post  laparotomy, drainage of intraabdominal abscess, repair of small bowel perforation. Urine culture grew enterococcus which was treated with IV Unasyn, completed course of antibiotics -Gen. Surgery following- recommended TID wet to dry dressing changes   Rectal bleeding of unknown cause -Appears to be stable, patient is not a candidate for endoscopic evaluation due to recent surgery. This was discussed by previous hospitalist with general surgery.  Anemia secondary to acute blood loss -Hemoglobin currently 7.5, appears stable -transfuse if  hemoglobin <7 -Continue to monitor CBC   Intra-abdominal abscess Completed IV Unasyn therapy  Diabetes  mellitus, type II -Continue insulin sliding scale CBG monitoring  Essential hypertension -Continue clonidine, hydralazine as needed  Enterococcus UTI -As above, patient was on Zosyn however transitioned to and completed Unasyn   Nutrition -Appetite still very poor. Continue tube feeds. - If oral appetite does not improve, patient might need either TPN or gastrostomy/jejunostomy. Will follow-up with surgery regarding the same  Hypokalemia/hypomagnesemia -Replace magnesium. Repeat a.m. Labs  Goals of care -Palliative care was consulted and appreciated however family wishes to continue aggressive care Follow further recommendations from palliative care  -Feel patient has an overall poor prognosis as she has had poor oral intake and stage IV appendiceal cancer  Stage II Pressure ulcer of buttocks - follow wound care recommendations   DVT ProphylaxisSCDs  Code Status:Full  Family Communication:None at bedside  Disposition Plan: unclear at this time Pending improvement in oral intake. SNF vs LTAC  Consultants PCCM General surgery Neurology  Oncology Palliative care  Procedures  Continuous EEG Antimicrobials:           Anti-infectives    Start     Dose/Rate Route Frequency Ordered Stop   07/04/17 1100  Ampicillin-Sulbactam (UNASYN) 3 g in sodium chloride 0.9 % 100 mL IVPB     3 g 200 mL/hr over 30 Minutes Intravenous Every 6 hours 07/04/17 0823 07/10/17 0429   07/03/17 1600  Ampicillin-Sulbactam (UNASYN) 3 g in sodium chloride 0.9 % 100 mL IVPB  Status:  Discontinued     3 g 200 mL/hr over 30 Minutes Intravenous Every 6 hours 07/03/17 1047 07/04/17 0823   06/29/17 1400  ceFEPIme (MAXIPIME) 2 g in dextrose 5 % 50 mL IVPB  Status:  Discontinued     2 g 100 mL/hr over 30 Minutes Intravenous Every 8 hours 06/29/17 0853 06/29/17 0857    06/29/17 1000  ceFEPIme (MAXIPIME) 2 g in dextrose 5 % 50 mL IVPB  Status:  Discontinued     2 g 100 mL/hr over 30 Minutes Intravenous Every 8 hours 06/29/17 0857 07/03/17 1046   06/29/17 0930  metroNIDAZOLE (FLAGYL) IVPB 500 mg  Status:  Discontinued     500 mg 100 mL/hr over 60 Minutes Intravenous Every 8 hours 06/29/17 0838 06/30/17 0956   06/24/17 0600  piperacillin-tazobactam (ZOSYN) IVPB 3.375 g  Status:  Discontinued     3.375 g 12.5 mL/hr over 240 Minutes Intravenous Every 8 hours 06/23/17 2254 06/29/17 0838   06/23/17 2300  piperacillin-tazobactam (ZOSYN) IVPB 3.375 g     3.375 g 100 mL/hr over 30 Minutes Intravenous  Once 06/23/17 2254 06/24/17 0056        Subjective: Patient seen and examined at bedside. She is awake and answers some questions. Her appetite is still very poor as per the nursing staff. She still slightly confused. No overnight fever, nausea or vomiting  Objective: Vitals:   07/18/17 1757 07/18/17 2225 07/19/17 0500 07/19/17 0632  BP: (!) 149/91 (!) 144/77  134/76  Pulse: 97 91  98  Resp:  20  18  Temp:  99.9 F (37.7 C)  99.6 F (37.6 C)  TempSrc:      SpO2:  98%  98%  Weight:   (!) 171.7 kg (378 lb 8.5 oz)   Height:        Intake/Output Summary (Last 24 hours) at 07/19/17 1106 Last data filed at 07/19/17 0931  Gross per 24 hour  Intake               60 ml  Output  2200 ml  Net            -2140 ml   Filed Weights   07/17/17 0544 07/18/17 0533 07/19/17 0500  Weight: 91.2 kg (201 lb 1 oz) 88.6 kg (195 lb 5.2 oz) (!) 171.7 kg (378 lb 8.5 oz)    Examination:  General exam: Appears calm and comfortable But still slightly confused Respiratory system: Bilateral decreased breath sound at bases with scattered crackles Cardiovascular system: S1 & S2 heard, rate controlled  Gastrointestinal system: Abdomen is nondistended, soft and nontender. Normal bowel sounds heard. Dressing in place Extremities: No cyanosis, clubbing; trace  edema    Data Reviewed: I have personally reviewed following labs and imaging studies  CBC:  Recent Labs Lab 07/15/17 0408 07/16/17 0521 07/17/17 0428 07/18/17 0404 07/19/17 0449  WBC 8.3 8.7 7.8 8.4 8.7  NEUTROABS  --   --   --   --  6.7  HGB 7.2* 7.3* 7.1* 7.3* 7.5*  HCT 23.5* 23.3* 23.2* 23.9* 24.5*  MCV 87.0 86.6 87.2 87.2 87.5  PLT 296 335 343 365 400   Basic Metabolic Panel:  Recent Labs Lab 07/13/17 0430 07/14/17 0426 07/15/17 0408 07/17/17 0428 07/18/17 0404 07/19/17 0449  NA 138 136 136 133* 134* 136  K 4.1 4.1 4.0 3.8 4.0 4.1  CL 103 103 102 100* 99* 100*  CO2 29 26 29 27 28 29   GLUCOSE 152* 115* 100* 106* 166* 124*  BUN 11 13 9  5* 7 10  CREATININE 0.45 0.43* 0.38* 0.52 0.49 0.46  CALCIUM 8.0* 7.9* 8.1* 7.8* 7.8* 7.8*  MG 1.6*  --  1.5*  --   --  1.3*   GFR: Estimated Creatinine Clearance: 116.8 mL/min (by C-G formula based on SCr of 0.46 mg/dL). Liver Function Tests: No results for input(s): AST, ALT, ALKPHOS, BILITOT, PROT, ALBUMIN in the last 168 hours. No results for input(s): LIPASE, AMYLASE in the last 168 hours. No results for input(s): AMMONIA in the last 168 hours. Coagulation Profile: No results for input(s): INR, PROTIME in the last 168 hours. Cardiac Enzymes: No results for input(s): CKTOTAL, CKMB, CKMBINDEX, TROPONINI in the last 168 hours. BNP (last 3 results) No results for input(s): PROBNP in the last 8760 hours. HbA1C: No results for input(s): HGBA1C in the last 72 hours. CBG:  Recent Labs Lab 07/18/17 0758 07/18/17 1210 07/18/17 1748 07/18/17 2226 07/19/17 0751  GLUCAP 139* 115* 98 106* 121*   Lipid Profile: No results for input(s): CHOL, HDL, LDLCALC, TRIG, CHOLHDL, LDLDIRECT in the last 72 hours. Thyroid Function Tests: No results for input(s): TSH, T4TOTAL, FREET4, T3FREE, THYROIDAB in the last 72 hours. Anemia Panel: No results for input(s): VITAMINB12, FOLATE, FERRITIN, TIBC, IRON, RETICCTPCT in the last 72  hours. Sepsis Labs: No results for input(s): PROCALCITON, LATICACIDVEN in the last 168 hours.  Recent Results (from the past 240 hour(s))  MRSA PCR Screening     Status: None   Collection Time: 07/11/17  8:58 AM  Result Value Ref Range Status   MRSA by PCR NEGATIVE NEGATIVE Final    Comment:        The GeneXpert MRSA Assay (FDA approved for NASAL specimens only), is one component of a comprehensive MRSA colonization surveillance program. It is not intended to diagnose MRSA infection nor to guide or monitor treatment for MRSA infections.          Radiology Studies: No results found.      Scheduled Meds: . carbamazepine  150 mg Oral TID  .  cloNIDine  0.1 mg Transdermal Weekly  . feeding supplement (ENSURE ENLIVE)  237 mL Oral TID BM  . feeding supplement (JEVITY 1.5 CAL/FIBER)  1,000 mL Per Tube Q24H  . feeding supplement (PRO-STAT SUGAR FREE 64)  30 mL Per Tube BID  . folic acid  1 mg Per Tube Daily  . insulin aspart  0-15 Units Subcutaneous TID WC  . insulin aspart  0-5 Units Subcutaneous QHS  . levETIRAcetam  1,000 mg Oral BID  . lipase/protease/amylase  24,000 Units Oral Once   And  . sodium bicarbonate  650 mg Oral Once  . metoprolol tartrate  25 mg Oral BID  . mirtazapine  7.5 mg Oral QHS  . multivitamin  15 mL Per Tube Daily   Continuous Infusions:   LOS: 26 days        Aline August, MD [facility] Pager [phone]  If 7PM-7AM, please contact night-coverage [email or web address] Password [email or web address] 07/19/2017, 11:06 AM

## 2017-07-19 NOTE — Progress Notes (Signed)
Nutrition Follow-up  DOCUMENTATION CODES:   Obesity unspecified  INTERVENTION:   -Continue nocturnal feedings: Jevity 1.5 @ 50 ml/hr via cortrak tube   30 ml Prostat BID.    Tube feeding regimen provides 1100 kcal (58% of needs), 68 grams of protein, and 456 ml of H2O.   -Continue liquid MVI  -Continue Ensure Enlive po TID, each supplement provides 350 kcal and 20 grams of protein  NUTRITION DIAGNOSIS:   Inadequate oral intake related to lethargy/confusion, poor appetite as evidenced by meal completion < 25%.  Ongoing  GOAL:   Patient will meet greater than or equal to 90% of their needs  Progressing  MONITOR:   PO intake, Diet advancement, Labs, TF tolerance, Skin, I & O's  REASON FOR ASSESSMENT:   Consult Assessment of nutrition requirement/status  ASSESSMENT:   Lori [name] is a 66 y.o. female with medical history significant of DM2, Seizures (not on medication), HTN, HLD, GERD, cervical and appendiceal carcinoma, intracranial aneurysm repair in 1997 who presents for rectal bleeding.  Pt being fed by RN at time of visit; observed that pt required a lot of cueing and encouragement to eat and still is only consuming bites and sips. Meal completion 0-15%.   Reviewed SLP note from 07/19/17; "issues are related primarily to poor motivation to eat, impaired attention secondary to CVA".   Pt remains with cortrak tube, receiving nocturnal feedings of Jevity 1.5 with 30 ml Prostat BID. Regimen meeting 58% of estimated kcal needs in order to assist with stimulating appetite.   Per palliative care notes, pt family continues to desire aggressive care measures despite poor prognosis.   Labs reviewed: CBGS: 98-121.   Diet Order:  DIET DYS 3 Room service appropriate? Yes; Fluid consistency: Thin  Skin:  Wound (see comment) (closed abdominal incision, stage 2 lt buttocks x2)  Last BM:  07/17/17  Height:   Ht Readings from Last 1 Encounters:  07/01/17 5\' 8"  (1.727  m)    Weight:   Wt Readings from Last 1 Encounters:  07/19/17 (!) 378 lb 8.5 oz (171.7 kg)    Ideal Body Weight:  65.9 kg  BMI:  Body mass index is 57.56 kg/m.  Estimated Nutritional Needs:   Kcal:  1900-2100  Protein:  115-130 grams  Fluid:  > 1.9 L  EDUCATION NEEDS:   Education needs no appropriate at this time  Lori [name], RD, LDN, CDE Pager: [phone] After hours Pager: [phone]

## 2017-07-20 LAB — CBC WITH DIFFERENTIAL/PLATELET
BASOS PCT: 0 %
Basophils Absolute: 0 10*3/uL (ref 0.0–0.1)
Eosinophils Absolute: 0.1 10*3/uL (ref 0.0–0.7)
Eosinophils Relative: 1 %
HEMATOCRIT: 27.4 % — AB (ref 36.0–46.0)
Hemoglobin: 8.3 g/dL — ABNORMAL LOW (ref 12.0–15.0)
LYMPHS PCT: 11 %
Lymphs Abs: 1.1 10*3/uL (ref 0.7–4.0)
MCH: 26.6 pg (ref 26.0–34.0)
MCHC: 30.3 g/dL (ref 30.0–36.0)
MCV: 87.8 fL (ref 78.0–100.0)
MONO ABS: 0.8 10*3/uL (ref 0.1–1.0)
MONOS PCT: 7 %
NEUTROS ABS: 8.3 10*3/uL — AB (ref 1.7–7.7)
Neutrophils Relative %: 81 %
Platelets: 586 10*3/uL — ABNORMAL HIGH (ref 150–400)
RBC: 3.12 MIL/uL — ABNORMAL LOW (ref 3.87–5.11)
RDW: 17.1 % — AB (ref 11.5–15.5)
WBC: 10.3 10*3/uL (ref 4.0–10.5)

## 2017-07-20 LAB — GASTROINTESTINAL PANEL BY PCR, STOOL (REPLACES STOOL CULTURE)

## 2017-07-20 LAB — BASIC METABOLIC PANEL
Anion gap: 8 (ref 5–15)
BUN: 18 mg/dL (ref 6–20)
CALCIUM: 8.2 mg/dL — AB (ref 8.9–10.3)
CO2: 30 mmol/L (ref 22–32)
CREATININE: 0.57 mg/dL (ref 0.44–1.00)
Chloride: 99 mmol/L — ABNORMAL LOW (ref 101–111)
GFR calc Af Amer: >60 mL/min (ref >60)
GFR calc non Af Amer: >60 mL/min (ref >60)
GLUCOSE: 166 mg/dL — AB (ref 65–99)
Potassium: 3.7 mmol/L (ref 3.5–5.1)
Sodium: 137 mmol/L (ref 135–145)

## 2017-07-20 LAB — C DIFFICILE QUICK SCREEN W PCR REFLEX
C Diff antigen: NEGATIVE
C Diff interpretation: NOT DETECTED
C Diff toxin: NEGATIVE

## 2017-07-20 LAB — GLUCOSE, CAPILLARY
GLUCOSE-CAPILLARY: 162 mg/dL — AB (ref 65–99)
Glucose-Capillary: 162 mg/dL — ABNORMAL HIGH (ref 65–99)
Glucose-Capillary: 192 mg/dL — ABNORMAL HIGH (ref 65–99)
Glucose-Capillary: 178 mg/dL — ABNORMAL HIGH (ref 65–99)

## 2017-07-20 LAB — MAGNESIUM: Magnesium: 1.6 mg/dL — ABNORMAL LOW (ref 1.7–2.4)

## 2017-07-20 MED ORDER — WHITE PETROLATUM GEL
Status: AC
Start: 2017-07-20 — End: 2017-07-21
  Filled 2017-07-20: qty 1

## 2017-07-20 MED ORDER — MAGNESIUM SULFATE 2 GM/50ML IV SOLN
2.0000 g | Freq: Once | INTRAVENOUS | Status: AC
  Administered 2017-07-20: 2 g via INTRAVENOUS
  Filled 2017-07-20: qty 50

## 2017-07-20 NOTE — Progress Notes (Signed)
[facility] Progress Note     Subjective: CC:  No new complaints. Denies abdominal pain. Having bowel function - 4 loose stools in past 12h. Tolerating TF. Poor PO intake.  Objective: Vital signs in last 24 hours: Temp:  [98 F (36.7 C)-98.9 F (37.2 C)] 98.9 F (37.2 C) (08/17 0722) Pulse Rate:  [98-108] 98 (08/17 0722) Resp:  [18-24] 24 (08/17 0722) BP: (146-148)/(79-96) 146/79 (08/17 0722) SpO2:  [98 %-99 %] 98 % (08/17 0722) Last BM Date: 07/19/17  Intake/Output from previous day: 08/16 0701 - 08/17 0700 In: 260 [P.O.:60; NG/GT:200] Out: 700 [Urine:700] Intake/Output this shift: No intake/output data recorded.  PE: Gen:  Alert, NAD, pleasant Card:  Regular rate and rhythm Pulm:  Normal effort Abd: Soft, non-tender, non-distended, bowel sounds present   Midline incision: >75% granulation tissue; I debrided some slough at bedside; no necrosis.      Skin: warm and dry, no rashes  Psych: A&Ox3   Lab Results:   Recent Labs  07/19/17 0449 07/20/17 0406  WBC 8.7 10.3  HGB 7.5* 8.3*  HCT 24.5* 27.4*  PLT 384 586*   BMET  Recent Labs  07/19/17 0449 07/20/17 0406  NA 136 137  K 4.1 3.7  CL 100* 99*  CO2 29 30  GLUCOSE 124* 166*  BUN 10 18  CREATININE 0.46 0.57  CALCIUM 7.8* 8.2*   PT/INR No results for input(s): LABPROT, INR in the last 72 hours. CMP     Component Value Date/Time   NA 137 07/20/2017 0406   K 3.7 07/20/2017 0406   CL 99 (L) 07/20/2017 0406   CO2 30 07/20/2017 0406   GLUCOSE 166 (H) 07/20/2017 0406   BUN 18 07/20/2017 0406   CREATININE 0.57 07/20/2017 0406   CALCIUM 8.2 (L) 07/20/2017 0406   PROT 5.8 (L) 06/30/2017 0439   ALBUMIN 1.4 (L) 06/30/2017 0439   AST 12 (L) 06/30/2017 0439   ALT 8 (L) 06/30/2017 0439   ALKPHOS 80 06/30/2017 0439   BILITOT 0.3 06/30/2017 0439   GFRNONAA >60 07/20/2017 0406   GFRAA >60 07/20/2017 0406   Lipase     Component Value Date/Time   LIPASE 31 05/14/2017 2106        Studies/Results: No results found.  Anti-infectives: Anti-infectives    Start     Dose/Rate Route Frequency Ordered Stop   07/04/17 1100  Ampicillin-Sulbactam (UNASYN) 3 g in sodium chloride 0.9 % 100 mL IVPB     3 g 200 mL/hr over 30 Minutes Intravenous Every 6 hours 07/04/17 0823 07/10/17 0429   07/03/17 1600  Ampicillin-Sulbactam (UNASYN) 3 g in sodium chloride 0.9 % 100 mL IVPB  Status:  Discontinued     3 g 200 mL/hr over 30 Minutes Intravenous Every 6 hours 07/03/17 1047 07/04/17 0823   06/29/17 1400  ceFEPIme (MAXIPIME) 2 g in dextrose 5 % 50 mL IVPB  Status:  Discontinued     2 g 100 mL/hr over 30 Minutes Intravenous Every 8 hours 06/29/17 0853 06/29/17 0857   06/29/17 1000  ceFEPIme (MAXIPIME) 2 g in dextrose 5 % 50 mL IVPB  Status:  Discontinued     2 g 100 mL/hr over 30 Minutes Intravenous Every 8 hours 06/29/17 0857 07/03/17 1046   06/29/17 0930  metroNIDAZOLE (FLAGYL) IVPB 500 mg  Status:  Discontinued     500 mg 100 mL/hr over 60 Minutes Intravenous Every 8 hours 06/29/17 0838 06/30/17 0956   06/24/17 0600  piperacillin-tazobactam (ZOSYN) IVPB 3.375  g  Status:  Discontinued     3.375 g 12.5 mL/hr over 240 Minutes Intravenous Every 8 hours 06/23/17 2254 06/29/17 0838   06/23/17 2300  piperacillin-tazobactam (ZOSYN) IVPB 3.375 g     3.375 g 100 mL/hr over 30 Minutes Intravenous  Once 06/23/17 2254 06/24/17 0056       Assessment/Plan Diabetes insipidus Seizure disorder - Tegretol, Keppra Lower GI bleed - resolved Anemia - Hg 8.3,stable/improving DM HTN Enterococcus UTI - completed unasyn Acute metabolic encephalopathy/Left Thalamic Stroke/ Bilateral small SDH Malnutrition - started on dysphagia diet 8/7, but due to poor PO intake TF is being continued  Primary appendiceal carcinoma with metastatic disease, stage IV  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 [name]: 1. Diagnostic laparoscopy with peritoneal  nodule biopsy (consistent with metastatic adenocarcinoma on frozen section).  2. Exploratory laparotomy, drainage of abdominal abscess, repair of small bowel perforation, repair of small bowel enterotomy, enterocolostomy (mid ileum to mid transverse colon). - tolerating TF and having bowel function - local wound care   FEN- DYS III, TF VTE- SCDs, Not on chemical VTE 2/2 anemia ID- Zosyn 7/21>>7/27, Flagyl 7/27>>7/28, Cefipime 7/27>>7/27, Unasyn 7/31>>8/7  Plan:continue TIDwet to dry dressing changes to abdominal wound.    LOS: 20 days    [facility] 07/20/2017, 7:50 AM Pager: [phone] Consults: [phone] Mon-Fri 7:00 am-4:30 pm Sat-Sun 7:00 am-11:30 am

## 2017-07-20 NOTE — Progress Notes (Signed)
Physical Therapy Treatment Patient Details Name: Lori [name] MRN: [id number] DOB: 01-Oct-1951 Today's Date: 07/20/2017    History of Present Illness pt is a 66 y/o female with pmh significant for DM , Seizure d/o HTN, h/o brain aneurysm, cervical CA, metastatic mucinous adenocarcinoma of the appendix, admitted from SNF with c/o bloody bowel movements.  In hospital pt appeared to have seizure resulting in encephalopathy.  CT/MRI's suggest L thalamic, L cerebellar and right frontal lobe encephalomalacia    PT Comments    Progressing slowly toward goals.  Limited today by watery stools.  Emphasized bed mobility, sitting balance/activities EOB.   Follow Up Recommendations  SNF;Supervision/Assistance - 24 hour     Equipment Recommendations  Wheelchair (measurements PT)    Recommendations for Other Services       Precautions / Restrictions Precautions Precautions: Fall Precaution Comments: rectal tube, foley    Mobility  Bed Mobility Overal bed mobility: Needs Assistance Bed Mobility: Rolling;Sidelying to Sit;Sit to Sidelying Rolling: Mod assist (to Left) Sidelying to sit: Total assist;HOB elevated     Sit to sidelying: Total assist General bed mobility comments: pt assisted with R UE mildly  Transfers                 General transfer comment: deferred transfers due to constant watery diarrhea  Ambulation/Gait                 Stairs            Wheelchair Mobility    Modified Rankin (Stroke Patients Only)       Balance Overall balance assessment: Needs assistance Sitting-balance support: Single extremity supported Sitting balance-Leahy Scale: Poor Sitting balance - Comments: sat EOB x15 min working on sitting balance, upright midline posture, kicking each leg out several reps, reaching.  Emphasis on holding head up. Postural control: Posterior lean;Left lateral lean                                  Cognition  Arousal/Alertness: Awake/alert Behavior During Therapy: Flat affect Overall Cognitive Status: Impaired/Different from baseline Area of Impairment: Attention;Following commands;Safety/judgement;Awareness;Problem solving                   Current Attention Level: Focused (rarely sustained)   Following Commands: Follows one step commands inconsistently;Follows one step commands with increased time Safety/Judgement: Decreased awareness of deficits;Decreased awareness of safety Awareness: Intellectual Problem Solving: Slow processing;Requires verbal cues;Requires tactile cues        Exercises General Exercises - Lower Extremity Hip Flexion/Marching: AROM;AAROM;Strengthening;Both;10 reps;Supine Other Exercises Other Exercises: bicep/tricep pressess x10 bil    General Comments General comments (skin integrity, edema, etc.): pt interacted with her sister during the sitting trial.  Often not able to focus on task or conversation with her sister.      Pertinent Vitals/Pain Pain Assessment: No/denies pain    Home Living                      Prior Function            PT Goals (current goals can now be found in the care plan section) Acute Rehab PT Goals Patient Stated Goal: to go back to Rehab and continue to work in parallel bars PT Goal Formulation: With patient Time For Goal Achievement: 07/23/17 Potential to Achieve Goals: Fair Progress towards PT goals: Progressing toward goals    Frequency  Min 2X/week      PT Plan Current plan remains appropriate    Co-evaluation              AM-PAC PT "6 Clicks" Daily Activity  Outcome Measure  Difficulty turning over in bed (including adjusting bedclothes, sheets and blankets)?: Unable Difficulty moving from lying on back to sitting on the side of the bed? : Unable Difficulty sitting down on and standing up from a chair with arms (e.g., wheelchair, bedside commode, etc,.)?: Unable Help needed moving to  and from a bed to chair (including a wheelchair)?: Total Help needed walking in hospital room?: Total Help needed climbing 3-5 steps with a railing? : Total 6 Click Score: 6    End of Session   Activity Tolerance: Patient tolerated treatment well;Patient limited by lethargy;Patient limited by fatigue Patient left: in bed;with call bell/phone within reach;with bed alarm set;with family/visitor present Nurse Communication: Mobility status;Need for lift equipment PT Visit Diagnosis: Other abnormalities of gait and mobility (R26.89);Other symptoms and signs involving the nervous system (R29.898);Muscle weakness (generalized) (M62.81)     Time: 1884-1660 PT Time Calculation (min) (ACUTE ONLY): 49 min  Charges:  $Therapeutic Exercise: 8-22 mins $Therapeutic Activity: 23-37 mins                    G Codes:       Aug 15, 2017  Lori [name], PT [phone]  (pager)   Lori [name] 08-15-17, 5:42 PM

## 2017-07-20 NOTE — Clinical Social Work Note (Signed)
Insurance authorization obtained: [id number] [id number], rug level RUC. SNF can take tomorrow if stable for discharge. MD aware.  Dayton Scrape, [place]

## 2017-07-20 NOTE — Progress Notes (Signed)
Patient ID: Lori [name], female   DOB: 30-Jul-1951, 66 y.o.   MRN: [id number]  PROGRESS NOTE    Lori [name]  HYI:[id number] DOB: 1951-04-06 DOA: 06/23/2017 PCP: [name], MD   Brief Narrative:  66 year old female with history of diabetes, seizure disorder, hypertension, history of brain aneurysm, history of cervical cancer, mucinous adenocarcinoma involving appendix with metastatic disease, recent hospitalization for small bowel obstruction status post diagnostic laparoscopy in June 2018 and repair of small bowel perforation presented with rectal bleeding. Seen by oncology. Not thought to be a candidate for any treatment. Then she had what appears to be a seizure resulting in encephalopathy. Neurology was consulted. Palliative medicine continues to follow. Family still desires aggressive care. Patient was transferred to stepdown unit. Critical care medicine was consulted. Patient noted to have profuse urine output. 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 Mental status improving. Had MBS, started on dysphagia 2 diet however still poor oral intake, therefore NGT still in place. Patient transitioned to oral Keppra, Tegretol. Mental status improving. Neurology felt no need for lumbar puncture as mental status has been improving. 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[place])   HTN (hypertension)   Seizure ([place])   Peritoneal carcinomatosis ([place])   Intra-abdominal abscess ([place])   DNR (do not resuscitate) discussion   Advance care planning   Palliative care by specialist   Encephalopathy   Metastatic cancer ([place])   Intra-abdominal fluid collection   Cerebrovascular accident (CVA) due to thrombosis of precerebral artery ([place])   Adult failure to thrive   Pressure injury of skin    Diarrhea   - Nursing staff reports excessive diarrhea. We will check stool for C. Difficile  Acute metabolic encephalopathy/Left Thalamic  Stroke/ Bilateral small SDH -Patient was noted to have garbled speech on the afternoon of 06/28/2017 -Neurology consultation appreciated -CT head on 8/1: Evolving encephalomalacia at suspected left thalamic lacunar infarct. -MRI brain on 7/29: New tiny bilateral subdural hematomas, trace subarachnoid hemorrhage, punctate focus of diffusion abnormality in the dorsal thalamus -It was thought the patient was having seizure activity and she was loaded with Keppra -Patient has had continuous EEG monitoring: Progressively improving diffuse encephalopathy  -Neurology feltlumbar puncture is no longer necessary given that her mental status is improving -Patient had modified barium swallow, was placed on dysphagia 2 diet -Mental Status is improving: she is more awake  History of seizure disorder -on oral Tegretol and Keppra  Diabetes insipidus/polyuria -Patient had excessive urine output thought to be due to central diabetes insipidus due to abnormalities noted on MRI of the brain -Sodium level was also elevated with high serum osmolality 100 -She was given DDAVP  -sodium is 137 today  Primary appendiceal carcinoma with metastatic disease, stage IV with abdominal wound, status post treatment of intra-abdominal abscess -Oncology consultation appreciated, patient is not a candidate for chemotherapy and is thought to have an incurable condition. Poor prognosis -Palliative carefollowing -had recent SBO due to carcinomatosis status post laparotomy, drainage of intraabdominal abscess, repair of small bowel perforation. Urineculture grew enterococcus which was treated with IV Unasyn, completed course of antibiotics -Gen. Surgery following- dressing changes as per general surgery   Rectal bleeding of unknown cause -Appears to be stable, patient is not a candidate for endoscopic evaluation due to recent surgery. This was discussed by previous hospitalist with general surgery.  Anemia secondary  to acute blood loss -Hemoglobin currently 8.3, appears stable -transfuse if hemoglobin <7 -Continue  to monitor CBC   Intra-abdominal abscess Completed IV Unasyn therapy  Diabetes mellitus, type II -Continue insulin sliding scale CBG monitoring  Essential hypertension -Continue clonidine, hydralazine as needed  Enterococcus UTI -As above, patient was on Zosyn however transitioned to and completed Unasyn   Nutrition -Appetite still very poor. Continue tube feeds. - If oral appetite does not improve, patient might need either TPN or gastrostomy/jejunostomy. Will follow-up with surgery regarding the same  Hypokalemia/hypomagnesemia -Replace magnesium. Repeat a.m. Labs  Goals of care -Palliative care was consulted and appreciated however family wishes to continue aggressive care Follow further recommendations from palliative care -Feel patient has an overall poor prognosis as she has had poor oral intake and stage IV appendiceal cancer  Stage II Pressure ulcer of buttocks - follow wound care recommendations   DVT ProphylaxisSCDs  Code Status:Full  Family Communication:None at bedside  Disposition Plan:unclear at this timePending improvement in oral intake. SNF vs LTAC  Consultants PCCM General surgery Neurology  Oncology Palliative care  Procedures  Continuous EEG Antimicrobials:                            Anti-infectives   Start   Dose/Rate Route Frequency Ordered Stop   07/04/17 1100  Ampicillin-Sulbactam (UNASYN) 3 g in sodium chloride 0.9 % 100 mL IVPB   3 g 200 mL/hr over 30 Minutes Intravenous Every 6 hours 07/04/17 0823 07/10/17 0429   07/03/17 1600  Ampicillin-Sulbactam (UNASYN) 3 g in sodium chloride 0.9 % 100 mL IVPBStatus:Discontinued    3 g 200 mL/hr over 30 Minutes Intravenous Every 6 hours 07/03/17 1047 07/04/17 0823   06/29/17 1400  ceFEPIme (MAXIPIME) 2 g in dextrose 5 % 50 mL  IVPBStatus:Discontinued    2 g 100 mL/hr over 30 Minutes Intravenous Every 8 hours 06/29/17 0853 06/29/17 0857   06/29/17 1000  ceFEPIme (MAXIPIME) 2 g in dextrose 5 % 50 mL IVPBStatus:Discontinued    2 g 100 mL/hr over 30 Minutes Intravenous Every 8 hours 06/29/17 0857 07/03/17 1046   06/29/17 0930  metroNIDAZOLE (FLAGYL) IVPB 500 mgStatus:Discontinued    500 mg 100 mL/hr over 60 Minutes Intravenous Every 8 hours 06/29/17 0838 06/30/17 0956   06/24/17 0600  piperacillin-tazobactam (ZOSYN) IVPB 3.375 gStatus:Discontinued    3.375 g 12.5 mL/hr over 240 Minutes Intravenous Every 8 hours 06/23/17 2254 06/29/17 0838   06/23/17 2300  piperacillin-tazobactam (ZOSYN) IVPB 3.375 g   3.375 g 100 mL/hr over 30 Minutes Intravenous Once 06/23/17 2254 06/24/17 0056       Subjective: Patient seen and examined at bedside. Nursing staff reports excessive diarrhea. Patient is awake and answers some questions but is a poor historian.  Objective: Vitals:   07/19/17 1345 07/19/17 2241 07/19/17 2244 07/20/17 0722  BP: (!) 148/96 (!) 147/86 (!) 147/86 (!) 146/79  Pulse: (!) 108 (!) 105 (!) 105 98  Resp: 18  20 (!) 24  Temp: 98 F (36.7 C)  98.7 F (37.1 C) 98.9 F (37.2 C)  TempSrc: Oral  Oral Oral  SpO2: 98%  99% 98%  Weight:      Height:        Intake/Output Summary (Last 24 hours) at 07/20/17 1238 Last data filed at 07/20/17 1000  Gross per 24 hour  Intake              260 ml  Output                0  ml  Net              260 ml   Filed Weights   07/17/17 0544 07/18/17 0533 07/19/17 0500  Weight: 91.2 kg (201 lb 1 oz) 88.6 kg (195 lb 5.2 oz) (!) 171.7 kg (378 lb 8.5 oz)    Examination:  General exam: Appears ill and slightly confused; with NG tube Respiratory system: Bilateral decreased breath sound at bases with scattered crackles Cardiovascular system: S1 & S2 heard, tachycardic  Gastrointestinal system: Abdomen is nondistended, soft and  nontender. Normal bowel sounds heard. Dressing in place Extremities: No cyanosis, clubbing, trace edema   Data Reviewed: I have personally reviewed following labs and imaging studies  CBC:  Recent Labs Lab 07/16/17 0521 07/17/17 0428 07/18/17 0404 07/19/17 0449 07/20/17 0406  WBC 8.7 7.8 8.4 8.7 10.3  NEUTROABS  --   --   --  6.7 8.3*  HGB 7.3* 7.1* 7.3* 7.5* 8.3*  HCT 23.3* 23.2* 23.9* 24.5* 27.4*  MCV 86.6 87.2 87.2 87.5 87.8  PLT 335 343 365 384 974*   Basic Metabolic Panel:  Recent Labs Lab 07/15/17 0408 07/17/17 0428 07/18/17 0404 07/19/17 0449 07/20/17 0406  NA 136 133* 134* 136 137  K 4.0 3.8 4.0 4.1 3.7  CL 102 100* 99* 100* 99*  CO2 29 27 28 29 30   GLUCOSE 100* 106* 166* 124* 166*  BUN 9 5* 7 10 18   CREATININE 0.38* 0.52 0.49 0.46 0.57  CALCIUM 8.1* 7.8* 7.8* 7.8* 8.2*  MG 1.5*  --   --  1.3* 1.6*   GFR: Estimated Creatinine Clearance: 116.8 mL/min (by C-G formula based on SCr of 0.57 mg/dL). Liver Function Tests: No results for input(s): AST, ALT, ALKPHOS, BILITOT, PROT, ALBUMIN in the last 168 hours. No results for input(s): LIPASE, AMYLASE in the last 168 hours. No results for input(s): AMMONIA in the last 168 hours. Coagulation Profile: No results for input(s): INR, PROTIME in the last 168 hours. Cardiac Enzymes: No results for input(s): CKTOTAL, CKMB, CKMBINDEX, TROPONINI in the last 168 hours. BNP (last 3 results) No results for input(s): PROBNP in the last 8760 hours. HbA1C: No results for input(s): HGBA1C in the last 72 hours. CBG:  Recent Labs Lab 07/19/17 1158 07/19/17 1759 07/19/17 2051 07/20/17 0754 07/20/17 1219  GLUCAP 120* 145* 136* 162* 192*   Lipid Profile: No results for input(s): CHOL, HDL, LDLCALC, TRIG, CHOLHDL, LDLDIRECT in the last 72 hours. Thyroid Function Tests: No results for input(s): TSH, T4TOTAL, FREET4, T3FREE, THYROIDAB in the last 72 hours. Anemia Panel: No results for input(s): VITAMINB12, FOLATE,  FERRITIN, TIBC, IRON, RETICCTPCT in the last 72 hours. Sepsis Labs: No results for input(s): PROCALCITON, LATICACIDVEN in the last 168 hours.  Recent Results (from the past 240 hour(s))  MRSA PCR Screening     Status: None   Collection Time: 07/11/17  8:58 AM  Result Value Ref Range Status   MRSA by PCR NEGATIVE NEGATIVE Final    Comment:        The GeneXpert MRSA Assay (FDA approved for NASAL specimens only), is one component of a comprehensive MRSA colonization surveillance program. It is not intended to diagnose MRSA infection nor to guide or monitor treatment for MRSA infections.          Radiology Studies: No results found.      Scheduled Meds: . carbamazepine  150 mg Oral TID  . cloNIDine  0.1 mg Transdermal Weekly  . feeding supplement (ENSURE ENLIVE)  237  mL Oral TID BM  . feeding supplement (JEVITY 1.5 CAL/FIBER)  1,000 mL Per Tube Q24H  . feeding supplement (PRO-STAT SUGAR FREE 64)  30 mL Per Tube BID  . folic acid  1 mg Per Tube Daily  . insulin aspart  0-15 Units Subcutaneous TID WC  . insulin aspart  0-5 Units Subcutaneous QHS  . levETIRAcetam  1,000 mg Oral BID  . lipase/protease/amylase  24,000 Units Oral Once   And  . sodium bicarbonate  650 mg Oral Once  . metoprolol tartrate  25 mg Oral BID  . mirtazapine  7.5 mg Oral QHS  . multivitamin  15 mL Per Tube Daily   Continuous Infusions:   LOS: 27 days        Aline August, MD [facility] Pager [phone]  If 7PM-7AM, please contact night-coverage [email or web address] Password [facility] 07/20/2017, 12:38 PM

## 2017-07-21 ENCOUNTER — Inpatient Hospital Stay (HOSPITAL_COMMUNITY): Payer: Medicare Other

## 2017-07-21 DIAGNOSIS — R4182 Altered mental status, unspecified: Secondary | ICD-10-CM

## 2017-07-21 DIAGNOSIS — R400 Somnolence: Secondary | ICD-10-CM

## 2017-07-21 LAB — CBC WITH DIFFERENTIAL/PLATELET
BASOS ABS: 0 10*3/uL (ref 0.0–0.1)
BASOS ABS: 0 10*3/uL (ref 0.0–0.1)
BASOS PCT: 0 %
Basophils Relative: 0 %
EOS ABS: 0 10*3/uL (ref 0.0–0.7)
EOS PCT: 0 %
Eosinophils Absolute: 0 10*3/uL (ref 0.0–0.7)
Eosinophils Relative: 0 %
HCT: 26.9 % — ABNORMAL LOW (ref 36.0–46.0)
HEMATOCRIT: 28.6 % — AB (ref 36.0–46.0)
HEMOGLOBIN: 8.7 g/dL — AB (ref 12.0–15.0)
Hemoglobin: 8.1 g/dL — ABNORMAL LOW (ref 12.0–15.0)
LYMPHS ABS: 1.3 10*3/uL (ref 0.7–4.0)
LYMPHS PCT: 9 %
Lymphocytes Relative: 12 %
Lymphs Abs: 1.2 10*3/uL (ref 0.7–4.0)
MCH: 27.2 pg (ref 26.0–34.0)
MCH: 27.5 pg (ref 26.0–34.0)
MCHC: 30.4 g/dL (ref 30.0–36.0)
MCHC: 30.1 g/dL (ref 30.0–36.0)
MCV: 89.4 fL (ref 78.0–100.0)
MCV: 91.2 fL (ref 78.0–100.0)
MONO ABS: 1.1 10*3/uL — AB (ref 0.1–1.0)
MONOS PCT: 7 %
Monocytes Absolute: 0.8 10*3/uL (ref 0.1–1.0)
Monocytes Relative: 9 %
NEUTROS ABS: 8.7 10*3/uL — AB (ref 1.7–7.7)
NEUTROS PCT: 81 %
Neutro Abs: 10 10*3/uL — ABNORMAL HIGH (ref 1.7–7.7)
Neutrophils Relative %: 82 %
PLATELETS: 465 10*3/uL — AB (ref 150–400)
Platelets: 632 10*3/uL — ABNORMAL HIGH (ref 150–400)
RBC: 3.2 MIL/uL — ABNORMAL LOW (ref 3.87–5.11)
RBC: 2.95 MIL/uL — ABNORMAL LOW (ref 3.87–5.11)
RDW: 17.8 % — AB (ref 11.5–15.5)
RDW: 17.6 % — AB (ref 11.5–15.5)
WBC: 10.8 10*3/uL — ABNORMAL HIGH (ref 4.0–10.5)
WBC: 12.3 10*3/uL — ABNORMAL HIGH (ref 4.0–10.5)

## 2017-07-21 LAB — GLUCOSE, CAPILLARY
GLUCOSE-CAPILLARY: 215 mg/dL — AB (ref 65–99)
GLUCOSE-CAPILLARY: 112 mg/dL — AB (ref 65–99)
Glucose-Capillary: 214 mg/dL — ABNORMAL HIGH (ref 65–99)
Glucose-Capillary: 161 mg/dL — ABNORMAL HIGH (ref 65–99)
Glucose-Capillary: 145 mg/dL — ABNORMAL HIGH (ref 65–99)

## 2017-07-21 LAB — COMPREHENSIVE METABOLIC PANEL
ALT: 12 U/L — ABNORMAL LOW (ref 14–54)
ANION GAP: 11 (ref 5–15)
AST: 28 U/L (ref 15–41)
Albumin: 1.4 g/dL — ABNORMAL LOW (ref 3.5–5.0)
Alkaline Phosphatase: 150 U/L — ABNORMAL HIGH (ref 38–126)
BUN: 46 mg/dL — ABNORMAL HIGH (ref 6–20)
CHLORIDE: 110 mmol/L (ref 101–111)
CO2: 23 mmol/L (ref 22–32)
Calcium: 7.8 mg/dL — ABNORMAL LOW (ref 8.9–10.3)
Creatinine, Ser: 1.26 mg/dL — ABNORMAL HIGH (ref 0.44–1.00)
GFR calc Af Amer: 50 mL/min — ABNORMAL LOW (ref >60)
GFR, EST NON AFRICAN AMERICAN: 43 mL/min — AB (ref >60)
Glucose, Bld: 156 mg/dL — ABNORMAL HIGH (ref 65–99)
POTASSIUM: 3 mmol/L — AB (ref 3.5–5.1)
Sodium: 144 mmol/L (ref 135–145)
Total Bilirubin: 0.5 mg/dL (ref 0.3–1.2)
Total Protein: 5.4 g/dL — ABNORMAL LOW (ref 6.5–8.1)

## 2017-07-21 LAB — BLOOD GAS, ARTERIAL
ACID-BASE DEFICIT: 0.1 mmol/L (ref 0.0–2.0)
BICARBONATE: 23.5 mmol/L (ref 20.0–28.0)
Drawn by: 270221
O2 CONTENT: 3 L/min
O2 Saturation: 97.4 %
PATIENT TEMPERATURE: 102.6
PO2 ART: 110 mmHg — AB (ref 83.0–108.0)
pCO2 arterial: 39.3 mmHg (ref 32.0–48.0)
pH, Arterial: 7.406 (ref 7.350–7.450)

## 2017-07-21 LAB — APTT: APTT: 35 s (ref 24–36)

## 2017-07-21 LAB — BASIC METABOLIC PANEL
Anion gap: 12 (ref 5–15)
BUN: 38 mg/dL — AB (ref 6–20)
CALCIUM: 8.4 mg/dL — AB (ref 8.9–10.3)
CHLORIDE: 104 mmol/L (ref 101–111)
CO2: 26 mmol/L (ref 22–32)
CREATININE: 0.8 mg/dL (ref 0.44–1.00)
GFR calc non Af Amer: >60 mL/min (ref >60)
Glucose, Bld: 222 mg/dL — ABNORMAL HIGH (ref 65–99)
Potassium: 3.4 mmol/L — ABNORMAL LOW (ref 3.5–5.1)
SODIUM: 142 mmol/L (ref 135–145)

## 2017-07-21 LAB — PROTIME-INR
INR: 1.66
PROTHROMBIN TIME: 19.8 s — AB (ref 11.4–15.2)

## 2017-07-21 LAB — MAGNESIUM: Magnesium: 2.1 mg/dL (ref 1.7–2.4)

## 2017-07-21 LAB — PROCALCITONIN: PROCALCITONIN: 2.01 ng/mL

## 2017-07-21 LAB — LACTIC ACID, PLASMA: LACTIC ACID, VENOUS: 3.2 mmol/L — AB (ref 0.5–1.9)

## 2017-07-21 MED ORDER — PIPERACILLIN-TAZOBACTAM 3.375 G IVPB 30 MIN
3.3750 g | Freq: Once | INTRAVENOUS | Status: AC
  Administered 2017-07-21: 3.375 g via INTRAVENOUS
  Filled 2017-07-21: qty 50

## 2017-07-21 MED ORDER — LORAZEPAM 2 MG/ML IJ SOLN
1.0000 mg | INTRAMUSCULAR | Status: DC | PRN
  Administered 2017-07-21 – 2017-07-25 (×2): 1 mg via INTRAVENOUS
  Filled 2017-07-21 (×2): qty 1

## 2017-07-21 MED ORDER — LORAZEPAM 2 MG/ML IJ SOLN: 1.0000 mg | INTRAMUSCULAR | Status: DC | PRN

## 2017-07-21 MED ORDER — SODIUM CHLORIDE 0.9 % IV BOLUS (SEPSIS)
1000.0000 mL | Freq: Once | INTRAVENOUS | Status: AC
  Administered 2017-07-21: 1000 mL via INTRAVENOUS

## 2017-07-21 MED ORDER — LEVETIRACETAM 1000 MG PO TABS: 1000.0000 mg | ORAL_TABLET | Freq: Two times a day (BID) | ORAL | 0 refills | Status: DC

## 2017-07-21 MED ORDER — LOPERAMIDE HCL 2 MG PO CAPS
2.0000 mg | ORAL_CAPSULE | ORAL | Status: DC | PRN
  Administered 2017-07-21: 2 mg via ORAL
  Filled 2017-07-21: qty 1

## 2017-07-21 MED ORDER — MIRTAZAPINE 7.5 MG PO TABS: 7.5000 mg | ORAL_TABLET | Freq: Every day | ORAL | 0 refills | Status: AC

## 2017-07-21 MED ORDER — ACETAMINOPHEN 160 MG/5ML PO SOLN
650.0000 mg | Freq: Once | ORAL | Status: AC
  Administered 2017-07-21: 650 mg
  Filled 2017-07-21: qty 20.3

## 2017-07-21 MED ORDER — ONDANSETRON HCL 4 MG PO TABS: 4.0000 mg | ORAL_TABLET | Freq: Four times a day (QID) | ORAL | 0 refills | Status: AC | PRN

## 2017-07-21 MED ORDER — SODIUM CHLORIDE 0.9 % IV BOLUS (SEPSIS): 1000.0000 mL | Freq: Once | INTRAVENOUS | Status: DC

## 2017-07-21 MED ORDER — SODIUM CHLORIDE 0.9 % IV SOLN
2000.0000 mg | Freq: Once | INTRAVENOUS | Status: AC
  Administered 2017-07-21: 2000 mg via INTRAVENOUS
  Filled 2017-07-21: qty 20

## 2017-07-21 MED ORDER — ENSURE ENLIVE PO LIQD: 237.0000 mL | Freq: Three times a day (TID) | ORAL | 12 refills | Status: AC

## 2017-07-21 MED ORDER — METOPROLOL TARTRATE 25 MG PO TABS: 25.0000 mg | ORAL_TABLET | Freq: Two times a day (BID) | ORAL | Status: DC

## 2017-07-21 MED ORDER — SODIUM CHLORIDE 0.9 % IV BOLUS (SEPSIS)
500.0000 mL | Freq: Once | INTRAVENOUS | Status: AC
  Administered 2017-07-21: 500 mL via INTRAVENOUS

## 2017-07-21 MED ORDER — JEVITY 1.5 CAL/FIBER PO LIQD: 1000.0000 mL | ORAL | 0 refills | Status: DC

## 2017-07-21 MED ORDER — LOPERAMIDE HCL 2 MG PO CAPS: 2.0000 mg | ORAL_CAPSULE | ORAL | 0 refills | Status: AC | PRN

## 2017-07-21 MED ORDER — LEVETIRACETAM 100 MG/ML PO SOLN
1000.0000 mg | Freq: Two times a day (BID) | ORAL | Status: DC
  Administered 2017-07-21: 1000 mg
  Filled 2017-07-21 (×2): qty 10

## 2017-07-21 MED ORDER — ADULT MULTIVITAMIN LIQUID CH: 15.0000 mL | Freq: Every day | ORAL | 0 refills | Status: AC

## 2017-07-21 MED ORDER — CLONIDINE HCL 0.1 MG/24HR TD PTWK: 0.1000 mg | MEDICATED_PATCH | TRANSDERMAL | 0 refills | Status: AC

## 2017-07-21 MED ORDER — CARBAMAZEPINE 100 MG PO CHEW: 150.0000 mg | CHEWABLE_TABLET | Freq: Three times a day (TID) | ORAL | 0 refills | Status: DC

## 2017-07-21 MED ORDER — PRO-STAT SUGAR FREE PO LIQD: 30.0000 mL | Freq: Two times a day (BID) | ORAL | 0 refills | Status: AC

## 2017-07-21 MED ORDER — ACETAMINOPHEN 325 MG PO TABS
650.0000 mg | ORAL_TABLET | Freq: Four times a day (QID) | ORAL | Status: DC | PRN
  Filled 2017-07-21: qty 2

## 2017-07-21 MED ORDER — ACETAMINOPHEN 160 MG/5ML PO SOLN
650.0000 mg | Freq: Four times a day (QID) | ORAL | Status: DC | PRN
  Administered 2017-07-21 – 2017-07-31 (×5): 650 mg via ORAL
  Filled 2017-07-21 (×6): qty 20.3

## 2017-07-21 MED ORDER — VANCOMYCIN HCL 10 G IV SOLR
1500.0000 mg | Freq: Once | INTRAVENOUS | Status: AC
  Administered 2017-07-21: 1500 mg via INTRAVENOUS
  Filled 2017-07-21 (×2): qty 1500

## 2017-07-21 MED ORDER — VANCOMYCIN HCL IN DEXTROSE 1-5 GM/200ML-% IV SOLN
1000.0000 mg | Freq: Two times a day (BID) | INTRAVENOUS | Status: DC
  Administered 2017-07-22 – 2017-07-24 (×5): 1000 mg via INTRAVENOUS
  Filled 2017-07-21 (×5): qty 200

## 2017-07-21 MED ORDER — PIPERACILLIN-TAZOBACTAM 3.375 G IVPB
3.3750 g | Freq: Three times a day (TID) | INTRAVENOUS | Status: DC
  Administered 2017-07-21 – 2017-07-30 (×26): 3.375 g via INTRAVENOUS
  Filled 2017-07-21 (×30): qty 50

## 2017-07-21 MED ORDER — FOLIC ACID 1 MG PO TABS: 1.0000 mg | ORAL_TABLET | Freq: Every day | ORAL | 0 refills | Status: AC

## 2017-07-21 NOTE — Significant Event (Addendum)
Rapid Response Event Note Call received from Floor RN at 1415 for Pt with low BP. PEr RN 500 NS bolus already ordered about an hour ago per Dr. Starla Link however BP remains low. Advised RN to obtain lactic acid and call Provider with updated VS. RRT unable to come right away. Upon my arrival at 7 Dr. Starla Link at bedside reviewing EKG  and CCM en route.   Overview: Time Called: 5750 Arrival Time: 1440 Event Type: Hypotension  Initial Focused Assessment: Pt found resting in bed obtunded. Moans to sternal rub. Pupil size 3 round equal sluggish reaction. Shallow breathing p02 100% on 3 LNC. Lungs clear diminished bases. NGT intact, ABD mildly distended, ABD dressing in place CDI.   Interventions: Lactic Acid obtained prior to my arrival, ABG obtained resulted PH 7.4 Co2 39.3 Po2 110, bicarb 23.5. Liquid Tylenol placed down NGT. Dr. Titus Mould and PCCM NP at bedside to assess. Pt already receiving 1 L of fluids per floor RN. Additional 1 L  NS hung at 1525.  Family at bedside updated on Pt status and POC.    Plan of Care (if not transferred): Pt transfered to SDU 2 C. Per Dr. Titus Mould Pt to receive total of 3 Liters. To receive 3/3 next. Awaiting lab for Blood cultures and lactic acid.   Event Summary: Name of Physician Notified: Dr. Starla Link at 1415  Name of Consulting Physician Notified: Dr. Titus Mould at [address] [place]

## 2017-07-21 NOTE — Progress Notes (Addendum)
I was in a pt room when the nurse tech called me that patient son was upset, and needed to see me.I Went to room to meet with son. He complained about the flexi-seal bag not being empty.I Called nurse tech to empty the bag, which  She did immediately. Pt was alert when I got to the room, I went back to med room to get new order for imodium that was ordered for her loose stool. Gave her the med in apple sauce, she opened her mouth to take the med but did not swallow it. I was still calling on pt name to swallow med when I noticed her eyes rolling to the side, then I knew she was having an episode of seizure. I did all measure to keep her safe, then called Dr. Starla Link who was the hospitalist at the moment. Dr. Starla Link was in the within seconds. Rapid response nurse was also notified, and she suggested we should get lactic acid on pt, which I ordered. All treatment ordered by MD was carried out to the fullest before was transferred to 2 C.

## 2017-07-21 NOTE — Progress Notes (Signed)
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 She was d/c to SNF with wound VAC. She returned 7/21 with lower GI bleeding. Course has been c/b likely seizure and worsening encephalopathy.   Of note, pt is not a candidate for any further treatment for her cancer.  Palliative medicine has been following and, despite multiple discussions, family continues to request full code and all aggressive interventions. At one point family requested that MSO4 be stopped as they were concerned that it was contributing to her AMS.  However, there was no improvement without it and they felt she was uncomfortable so it was resumed.     She had persistent encephalopathy even after MSO4 stopped.  MRI of brain repeated 7/29 revealed no stroke but did reveal new small bilateral SDH's without any mass effect.  There was no evidence of metastatic disease. EEG 7/26 did not show any epileptiform activity only generalized cerebral dysfunction. Pt had slow progression  With poor oral intact requiring NGT . She was getting ready for discharge to SNF . On 07/21/17 had decreased mentation , fever and possible seizure activity with hypotension. Labs, cxr and EEG are pending . She was started on IVF resuscitation . B/p is responding . O2 sats 96% on room air with no increased wob. ABG is ok with pH 7.4, Pco2 39  SUBJECTIVE:  Lethargic. B/p is improving on fluid resusciation . Protecting airway currently .  Son updated at bedside.   VITAL SIGNS: BP (!)  85/47 (BP Location: Right Arm)   Pulse (!) 115   Temp (!) 102.6 F (39.2 C) (Axillary)   Resp 14   Ht 5' 8"  (1.727 m)   Wt 189 lb 14.4 oz (86.1 kg) Comment: SCD's and seizure pads removed.  SpO2 100%   BMI 28.87 kg/m   HEMODYNAMICS:    VENTILATOR SETTINGS:    INTAKE / OUTPUT: I/O last 3 completed shifts: In: 500 [P.O.:240; I.V.:10; NG/GT:200; IV Piggyback:50] Out: 500 [Urine:500]  PHYSICAL EXAMINATION: General appearance:  66 Year old  female, cachectic  chroncially ill appearing  HEENT : dry mucosa Neck: Trachea midline; neck supple, no JVD Lungs/chest: CTA w/ no wheezing  CV: RRR no m/r/g.  Abdomen: soft, mid abd wound -dsg clean/dry  Skin:abd wound dsg.  Psych:lethargic w/d to painful stim.  LABS:  BMET  Recent Labs Lab 07/19/17 0449 07/20/17 0406 07/21/17 0514  NA 136 137 142  K 4.1 3.7 3.4*  CL 100* 99* 104  CO2 29 30 26   BUN 10 18 38*  CREATININE 0.46 0.57 0.80  GLUCOSE 124* 166* 222*    Electrolytes  Recent Labs Lab 07/19/17 0449 07/20/17 0406 07/21/17 0514  CALCIUM 7.8* 8.2* 8.4*  MG 1.3* 1.6* 2.1    CBC  Recent Labs Lab 07/19/17 0449 07/20/17 0406 07/21/17 0514  WBC 8.7 10.3 10.8*  HGB 7.5* 8.3* 8.7*  HCT 24.5* 27.4* 28.6*  PLT 384 586* 632*    Coag's No results for input(s): APTT, INR in the last 168 hours.  Sepsis Markers No results for input(s):  LATICACIDVEN, PROCALCITON, O2SATVEN in the last 168 hours.  ABG  Recent Labs Lab 07/21/17 1500  PHART 7.406  PCO2ART 39.3  PO2ART 110*    Liver Enzymes No results for input(s): AST, ALT, ALKPHOS, BILITOT, ALBUMIN in the last 168 hours.  Cardiac Enzymes No results for input(s): TROPONINI, PROBNP in the last 168 hours.  Glucose  Recent Labs Lab 07/20/17 1219 07/20/17 1717 07/20/17 2109 07/21/17 0848 07/21/17 1137 07/21/17 1520  GLUCAP 192* 178* 162* 215* 214* 161*    Imaging No results found.   STUDIES:  MRI brain 7/29 >  New tiny bilateral subdural  hematomas over both posterior cerebral convexities. No mass effect.   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  No evidence of metastatic disease within limitations of motion. EEG 7/26 > generalized cerebral dysfunction, no epileptiform discharges.  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    DISCUSSION: 66yo female with Stage IV metastatic adenocarcinoma of the appendix admitted with GI bleeding. Course c/b small SDH, seizure, UTI, worsening encephalopathy.  Reconsulted for hypotension .  She is responding to IVF resucuiatation . Would move to SDU, follow pan culture, IV abx . CT chest  Check cxr .  -We do not think she is a candidate for intubation and that this would be futile care.   Plan Continue care per Primary .  Transfer to SDU .  CT head Follow lactate /Cx data  IVF resuciatation  IV abx with Zosyn /Vanc  Avoid sedating rx. ,Consider d/c remeron.  Cont Seizure medication     Tammy Parrett NP-C  [facility]  [phone]   07/21/2017 3:42 PM   STAFF NOTE: Linwood Dibbles, MD FACP have personally reviewed patient's available data, including medical history, events of note, physical examination and test results as part of my evaluation. I have discussed with resident/NP and other care providers such as pharmacist, RN and RRT. In addition, I personally evaluated  patient and elicited key findings of: well known to our service, met adeno appendix, 1 month stay, poor prognosis, not chemo candidate had abscess abdo , treated abx ended 6 days ago, enceph had resolved, now lethargic, coughing well, protecting airway, no ronchi, CTA, abdo soft, wound dry packed, fever today with new encph likely infectious, at risk for abdo abscess or other hospital acquired illness, send BC, start empiric nosocmial ABX coever abdo, fluid 3 liters over 30 cc/kg now, pcxr, CVT abdo/ pelvis, seziures possible per neuro, CT head, eeg ?, treat sepsis concerns, would be medically ineffective and futile for heroic measures, ett, any forms of life support, I met with son and told him we do not want to provide any forms of life suport would cause her to suffer and not benefit the patient, she does not require intubation now  And her MAP is now 66 with 1 liter, follow lactic  acid and CT, move to sdu The patient is critically ill with multiple organ systems failure and requires high complexity decision making for assessment and support, frequent evaluation and titration of therapies, application of advanced monitoring technologies and extensive interpretation of multiple databases.   Critical Care Time devoted to patient care services described in this note is 30 Minutes. This time reflects time of care of this signee: Merrie Roof, MD FACP. This critical care time does not reflect procedure time, or teaching time or supervisory time of PA/NP/Med student/Med Resident etc but could involve care discussion time. Rest per NP/medical resident whose note is outlined above and that I agree with   Lavon Paganini. Titus Mould, MD, [place] Pgr: [facility] 07/21/2017 4:29 PM

## 2017-07-21 NOTE — Progress Notes (Signed)
Received a page from radiogology (Dr. Sharlee Blew) regarding results of CT Scan. Informed of large ischemic bowel. Sugery oncall consulted and notified of results. Informed patient not deemed a candidate for surgery at this time d/t poor prognosis. Will f/u with attending about getting palliative care onboard quickly.   Jolayne Panther, NP [facility] [phone]

## 2017-07-21 NOTE — Progress Notes (Signed)
Received a page from Lori Newcomer NP at 8:45pm stating that radiology had recommended this patient undergo emergent surgery due to findings of pneumatosis intestinalis on CT abdomen.  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 [name] on 6/27.  She was admitted here on July 21 with GI bleeding which resolved without intervention. Her hospitalization has been prolonged by multiple episodes of altered mental status/ seizures/ encephalopathy.  She is not a candidate for oncologic treatment.  Palliative care has been involved and the patient's family has not wished to pursue hospice.  She was bound for nursing home today on nasojejunal tube feeds due to poor oral intake, however this morning she began to decompensate again. She became encephalopathic and essentially unresponsive, in addition to developing fever, hypotension and tachycardia. She was moved to stepdown and PCCM was consulted. In working her up, a CT of her abdomen and pelvis was ordered which shows multiple loops of small bowel with significant pneumatosis consistent with ischemia. Some air tracking along branches of SMV.  I have reviewed the CT and her chart. I spoke at length with her son at the bedside and her daughter in law, Lori [name], who is one of our OR nurses at [employer]. I discussed the findings and the implications of the CT. I informed them that this signifies bowel ischemia and possibly necrosis; that this is likely causing her septic picture and that will likely progress to perforation; shock and death. I also discussed that in her situation of recent surgery with a midline wound that has failed to heal and known frozen pelvis, with the extent of bowel that seems to be affected, repeat laparotomy would be unlikely to change the  outcome. She would likely sustain a large segment bowel resection, discontinuity, and open abdomen and I think that the ultimate outcome would be the same, with more suffering.  I made this very clear to the family and they seem to understand. I answered any questions. Her son's main concern is her seizures and altered mentation and he expresses concern that she has not been getting all of her medications.   At this point recommend continued supportive care, antibiotics, fluid resuscitation. Remove the feeding tube and place an NG for decompression. OK to give meds via NG once placement confirmed. Reconsult palliative care.

## 2017-07-21 NOTE — Progress Notes (Addendum)
Reason for consult: Seizures    This a well known patient to the neurology service. Had multiple seizures, prolonged encephalopathy. Improved and patient was communicative, alert and oriented x3. Last seen by neurology on 07/08/17.  Patient reconsulted after she was witnessed having a seizure. Witnessed by son who said she was clenched up and posturing then became unresponsive.    ROS:  Unable to obtain due to poor mental status  Examination  Vital signs in last 24 hours: Temp:  [97.4 F (36.3 C)-102.6 F (39.2 C)] 102.6 F (39.2 C) (08/18 1412) Pulse Rate:  [112-125] 115 (08/18 1523) Resp:  [10-24] 14 (08/18 1523) BP: (72-114)/(47-71) 85/47 (08/18 1523) SpO2:  [98 %-100 %] 100 % (08/18 1523) Weight:  [86.1 kg (189 lb 14.4 oz)] 86.1 kg (189 lb 14.4 oz) (08/18 0557)  General: Not in distress, cooperative CVS: pulse-normal rate and rhythm RS: breathing comfortably Extremities: normal   Neuro: MS: drowsy, nit following commands,  CN: pupils equal and reactive,  EOMI, face symmetric, tongue midline,, Motor: Moves all 4 extremities to pain Reflexes: 2+ bilaterally over patella, biceps, plantars: flexor   Basic Metabolic Panel:  Recent Labs Lab 07/15/17 0408 07/17/17 0428 07/18/17 0404 07/19/17 0449 07/20/17 0406 07/21/17 0514  NA 136 133* 134* 136 137 142  K 4.0 3.8 4.0 4.1 3.7 3.4*  CL 102 100* 99* 100* 99* 104  CO2 29 27 28 29 30 26   GLUCOSE 100* 106* 166* 124* 166* 222*  BUN 9 5* 7 10 18  38*  CREATININE 0.38* 0.52 0.49 0.46 0.57 0.80  CALCIUM 8.1* 7.8* 7.8* 7.8* 8.2* 8.4*  MG 1.5*  --   --  1.3* 1.6* 2.1    CBC:  Recent Labs Lab 07/17/17 0428 07/18/17 0404 07/19/17 0449 07/20/17 0406 07/21/17 0514  WBC 7.8 8.4 8.7 10.3 10.8*  NEUTROABS  --   --  6.7 8.3* 8.7*  HGB 7.1* 7.3* 7.5* 8.3* 8.7*  HCT 23.2* 23.9* 24.5* 27.4* 28.6*  MCV 87.2 87.2 87.5 87.8 89.4  PLT 343 365 384 586* 632*     Coagulation Studies: No results for input(s): LABPROT, INR in  the last 72 hours.  Imaging Reviewed:       ASSESSMENT AND PLAN  Metabolic and SepticEncephalopathy Seizures Metastatic Cancer stage IV Diabetes insipidus UTI and intrabdominal abscesses ?Bilateral small SDH, ? Thalamic Stroke   This is a 66 year old female with a past medical history significant for metastatic adenocarcinoma of the appendix, CVA with left sided deficits and known seizure disorder was brought in for GIB and subsequently found to have intra-abdominal abscesses. She was coded "stroke" on 07-18-2023 increased lethargy, garbled speech and a facial droop.Neurology was consulted for evaluation - favored to be a seizure as CT/MRI both without acute abnormality ( MRI did show restricted diffusion in left thalamus thought to be artifactual). EEG on 7/27 did not show epileptiform activity.   Patient was still lethargic, we placed VEEG on 07/04/17. The EEG did not show electrographic seizures however showed periodic lateralized discharges in the ictal interictal pattern. The load of the patient additional dose of Tegretol and resumed the Tegretol which was not being given due to the absence of a feeding tube. The EEG has shown improving encephalopathy and was disconnected.  LP was considered to r/o paraneoplastic however cancelled as patient improved clinically.    Now has another seizure. Unclear to etiology but son suspicious she is cheecking medications. Patient has mild leukocytosis, infection workup has been ordered.  Received  Ativan Recommend Keppra load 2 gram  Continue Keppra 1g and tegretol for seizure prophylaxis.

## 2017-07-21 NOTE — Progress Notes (Signed)
   07/21/17 1412  Vitals  Temp (!) 102.6 F (39.2 C)  Temp Source Axillary  BP (!) 72/58  BP Location Right Arm  BP Method Automatic  Patient Position (if appropriate) Lying  Pulse Rate Source Dinamap  Resp (!) 24  The above are pt vital signs. MD. Starla Link notified. Rapid response nurse has also been notified.

## 2017-07-21 NOTE — Discharge Summary (Signed)
Physician Discharge Summary  Lori [name] HFW:[id number] DOB: Apr 25, 1951 DOA: 06/23/2017  PCP: [name], MD  Admit date: 06/23/2017 Discharge date: 07/21/2017  Admitted From: Home Disposition: Nursing home  Recommendations for Outpatient Follow-up:  1. Follow up with nursing home provider at earliest convenience  2. Please obtain BMP/CBC in one week 3. Follow-up with general surgery/Dr. [name] in one week 4. Follow-up with oncology/Dr. [name] in 1-2 weeks 5. Follow-up with palliative care as an outpatient   Home Health: no  Equipment/Devices: NG tube. Discharge Condition: poor CODE STATUS: full  Diet recommendation: Heart Healthy / Carb Modified. Encourage the patient to increase oral intake. 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 Seen by oncology. Not thought to be a candidate for any treatment. Then she had what appears to be a seizure resulting in encephalopathy. Neurology was consulted. Palliative medicine continues to follow. Family still desires aggressive care. Patient was transferred to stepdown unit. Critical care medicine was consulted. Patient noted to have profuse urine output. 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 Mental status improving. Had MBS, started on dysphagia 2 diet however still poor oral intake, therefore NGT still in place. Patient transitioned to oral Keppra, Tegretol. Mental status improving. Neurology felt no need for lumbar puncture as mental status has been improving. Oral intake is still poor. Patient wants to continue NG tube feeding for now and wants  to keep trying to improve oral intake. Overall prognosis is very poor. Patient has refused hospice evaluation. Patient will benefit from continued evaluation and follow-up by palliative care.Patient will be discharged to nursing home once bed is available.  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 Difficile negative. Use Imodium when necessary  Acute metabolic encephalopathy/Left Thalamic Stroke/ Bilateral small SDH -Patient was noted to have garbled speech on the afternoon of 06/28/2017 -Neurology consultation appreciated -CT head on 8/1: Evolving encephalomalacia at suspected left thalamic lacunar infarct. -MRI brain on 7/29: New tiny bilateral subdural hematomas, trace subarachnoid hemorrhage, punctate focus of diffusion abnormality in the dorsal thalamus -It was thought the patient was having seizure activity and she was loaded with Keppra -Patient has had continuous EEG monitoring: Progressively improving diffuse encephalopathy  -Neurology feltlumbar puncture is no longer necessary given that her mental status is improving -Patient had modified barium swallow, was placed on dysphagia 2 diet -Mental Status is improving: she is more awake - outpatient follow-up with neurology. Continue Tegretol and Keppra  History of seizure disorder -on oral Tegretol and Keppra. Outpatient follow-up with neurology  Diabetes insipidus/polyuria -Patient had excessive urine output thought to be due to central diabetes insipidus due  to abnormalities noted on MRI of the brain -Sodium level was also elevated with high serum osmolality 100 -She was  given DDAVP  -sodium is 142 today -outpatient follow-up  Primary appendiceal carcinoma with metastatic disease, stage IV with abdominal wound, status post treatment of intra-abdominal abscess -Oncology consultation appreciated, patient is not a candidate for chemotherapy and is thought to have an incurable condition. Poor prognosis -Palliative carefollowing -had recent SBO due to carcinomatosis status post laparotomy, drainage of intraabdominal abscess, repair of small bowel perforation. Urineculture grew enterococcus which was treated with IV Unasyn, completed course of antibiotics -Gen. Surgery following- dressing changes as per general surgery - outpatient follow-up with general surgery, oncology and palliative care. Patient would benefit from hospice evaluation if she agrees   Rectal bleeding of unknown cause -Appears to be stable, patient is not a candidate for endoscopic evaluation due to recent surgery. This was discussed by previous hospitalist with general surgery.  Anemia secondary to acute blood loss -Hemoglobin currently 8.7, appears stable -transfuse if hemoglobin <7 -outpatient follow-up   Intra-abdominal abscess Completed IV Unasyn therapy  Diabetes mellitus, type II -Continue insulin sliding scale CBG monitoring  Essential hypertension -Continue clonidine and metoprolol  Enterococcus UTI -As above, patient was on Zosyn however transitioned to and completed Unasyn   Nutrition -Appetite still very poor. Continue tube feeds. - If oral appetite does not improve, patient might need either TPN or gastrostomy/jejunostomy. Will follow-up with surgery regarding the same - for now patient wants to continue to feeding and work on her oral intake  Hypokalemia/hypomagnesemia -improving. Outpatient follow-up  Goals of care -Palliative care was  consulted and appreciated however family wishes to continue aggressive care Follow further recommendations from palliative care -Feel patient has an overall poor prognosis as she has had poor oral intake and stage IV appendiceal cancer - outpatient follow-up with palliative care and oncology  Stage II Pressure ulcer of buttocks - follow wound care recommendations  Discharge Instructions  Discharge Instructions    Call MD for:  difficulty breathing, headache or visual disturbances    Complete by:  As directed    Call MD for:  extreme fatigue    Complete by:  As directed    Call MD for:  hives    Complete by:  As directed    Call MD for:  persistant dizziness or light-headedness    Complete by:  As directed    Call MD for:  persistant nausea and vomiting    Complete by:  As directed    Call MD for:  redness, tenderness, or signs of infection (pain, swelling, redness, odor or green/yellow discharge around incision site)    Complete by:  As directed    Call MD for:  severe uncontrolled pain    Complete by:  As directed    Call MD for:  temperature >100.4    Complete by:  As directed    Diet - low sodium heart healthy    Complete by:  As directed    Diet Carb Modified    Complete by:  As directed    Discharge instructions    Complete by:  As directed    Patient will benefit from Palliative care evaluation in SNF [place] precautions Continue NG feeding and encourage oral feeding as well.  Dressing changes as per General surgery recommendations   Increase activity slowly    Complete by:  As directed      Allergies as of 07/21/2017      Reactions   Contrast Media [iodinated Diagnostic Agents] Itching, Swelling   Dilantin [phenytoin Sodium Extended] Itching, Swelling, Other (See Comments)  Whole body   Latex Hives, Itching   Adhesive [tape] Rash      Medication List    STOP taking these medications   alum & mag hydroxide-simeth 200-200-20 MG/5ML suspension Commonly known  as:  MAALOX/MYLANTA   aspirin EC 81 MG tablet   calcium carbonate 500 MG chewable tablet Commonly known as:  TUMS - dosed in mg elemental calcium   cefUROXime 500 MG tablet Commonly known as:  CEFTIN   cholecalciferol 1000 units tablet Commonly known as:  VITAMIN D   cholecalciferol 400 units Tabs tablet Commonly known as:  VITAMIN D   famotidine 20 MG tablet Commonly known as:  PEPCID   magnesium oxide 400 (241.3 Mg) MG tablet Commonly known as:  MAG-OX   multivitamin with minerals Tabs tablet   potassium chloride SA 20 MEQ tablet Commonly known as:  K-DUR,KLOR-CON     TAKE these medications   acetaminophen 325 MG tablet Commonly known as:  TYLENOL Take 650 mg by mouth every 6 (six) hours as needed (pain). What changed:  Another medication with the same name was removed. Continue taking this medication, and follow the directions you see here.   carbamazepine 100 MG chewable tablet Commonly known as:  TEGRETOL Chew 1.5 tablets (150 mg total) by mouth 3 (three) times daily.   cloNIDine 0.1 mg/24hr patch Commonly known as:  CATAPRES - Dosed in mg/24 hr Place 1 patch (0.1 mg total) onto the skin once a week.   feeding supplement (ENSURE ENLIVE) Liqd Take 237 mLs by mouth 3 (three) times daily between meals. What changed:  when to take this   feeding supplement (JEVITY 1.5 CAL/FIBER) Liqd Place 1,000 mLs into feeding tube daily. What changed:  how much to take  how to take this  when to take this   feeding supplement (PRO-STAT SUGAR FREE 64) Liqd Place 30 mLs into feeding tube 2 (two) times daily.   folic acid 1 MG tablet Commonly known as:  FOLVITE Place 1 tablet (1 mg total) into feeding tube daily.   insulin aspart 100 UNIT/ML injection Commonly known as:  novoLOG Inject 0-20 Units into the skin every 4 (four) hours. Glucose 150-200 give 2 units, 201-250 give 4 units, 251-300 give 6 units, 301-350 give 8 units, 351 or greater give 10 units. What  changed:  how much to take  when to take this  additional instructions   levETIRAcetam 1000 MG tablet Commonly known as:  KEPPRA Take 1 tablet (1,000 mg total) by mouth 2 (two) times daily.   loperamide 2 MG capsule Commonly known as:  IMODIUM Take 1 capsule (2 mg total) by mouth as needed for diarrhea or loose stools.   metoprolol tartrate 25 MG tablet Commonly known as:  LOPRESSOR Take 25 mg by mouth 2 (two) times daily.   mirtazapine 7.5 MG tablet Commonly known as:  REMERON Take 1 tablet (7.5 mg total) by mouth at bedtime.   multivitamin Liqd Place 15 mLs into feeding tube daily.   ondansetron 4 MG tablet Commonly known as:  ZOFRAN Take 1 tablet (4 mg total) by mouth every 6 (six) hours as needed for nausea.   pravastatin 20 MG tablet Commonly known as:  PRAVACHOL Take 20 mg by mouth at bedtime.   vitamin C 500 MG tablet Commonly known as:  ASCORBIC ACID Take 500 mg by mouth See admin instructions. Take 1 tablet (500 mg) by mouth daily for 28 days for wound healing - stop date 07/19/17   zinc  gluconate 50 MG tablet Take 50 mg by mouth See admin instructions. Take 1 tablet (50 mg) by mouth daily for 14 days for wound healing - stop date 07/05/17       Contact information for follow-up providers    Jackolyn Confer, MD Follow up in 1 week(s).   Specialty:  General Surgery Contact information: [address] [place] [address] [phone]        Twana First, MD Follow up in 2 week(s).   Specialty:  Oncology Contact information: [place] [address] [phone]            Contact information for after-discharge care    Destination    [facility] Follow up.   Specialty:  [place] information: [place] [phone]                 Allergies  Allergen Reactions  . Contrast Media [Iodinated Diagnostic Agents] Itching and  Swelling  . Dilantin [Phenytoin Sodium Extended] Itching, Swelling and Other (See Comments)    Whole body  . Latex Hives and Itching  . Adhesive [Tape] Rash    Consultations:  General surgery, oncology, palliative care   Procedures/Studies: Ct Abdomen Pelvis Wo Contrast  Result Date: 06/23/2017 CLINICAL DATA:  GI bleed. Melena. History of cervical cancer. History of appendiceal cancer. EXAM: CT ABDOMEN AND PELVIS WITHOUT CONTRAST TECHNIQUE: Multidetector CT imaging of the abdomen and pelvis was performed following the standard protocol without IV contrast. COMPARISON:  05/15/2017 FINDINGS: Lower chest: The heart is enlarged. Tiny pericardial effusion is similar to prior. There is dependent atelectasis in the lower lungs with moderate right and small left pleural effusions. Hepatobiliary: No focal abnormality in the liver on this study without intravenous contrast. Gallbladder is decompressed. The possible tiny stone in the gallbladder neck. No intrahepatic or extrahepatic biliary dilation. Pancreas: No focal mass lesion. No dilatation of the main duct. No intraparenchymal cyst. No peripancreatic edema. Spleen: No splenomegaly. No focal mass lesion. Adrenals/Urinary Tract: No adrenal nodule or mass. Left kidney is atrophic as before. There is new bilateral mild hydroureteronephrosis. The bladder is distended. Tiny air bubble in the bladder lumen may be related to recent instrumentation although bladder infection can cause this appearance. Stomach/Bowel: Stomach is mildly distended and fluid-filled. Duodenum unremarkable. No overt small bowel obstruction. The terminal ileum is normal. Suture line in the region of the mid transverse colon is compatible with the clinical history of enterocolostomy from the mid ileum to the mid transverse colon. Left colon unremarkable. There is edema a congestion of the small bowel mesentery with small pockets of interloop mesenteric fluid. Vascular/Lymphatic: There is  abdominal aortic atherosclerosis without aneurysm. There is no gastrohepatic or hepatoduodenal ligament lymphadenopathy. No intraperitoneal or retroperitoneal lymphadenopathy. No pelvic sidewall lymphadenopathy. Reproductive: Uterus appears enlarged. Endometrium is thickened in this patient with a history of cervical cancer. There is no adnexal mass. Other: Focal fluid collection is identified in the right lower quadrant. This is difficult to assess without oral or intravenous contrast material, but it appears to have a circumferential rim and does not appear to represent bowel. There is another fluid collection inferior to the liver tracking down the right peritoneal cavity and into the right para colic gutter. This collection measures 8.8 cm in craniocaudal length by 7.4 cm and AP dimension by 2.5 cm coronally. This also has a rim of increased attenuation raising concern for abscess. Musculoskeletal: Open  midline wound identified with packing material. Diffuse body wall edema evident. Stable appearance sclerotic lesion right acetabulum. Bone windows reveal no worrisome lytic or sclerotic osseous lesions. IMPRESSION: 1. Patient is approximately 3 weeks out from diagnostic laparoscopy with abscess drainage, repair of small bowel perforation, and entero colostomy. On today's exam, there are 2 apparent fluid collections identified, but are difficult to assess given the lack of intravenous and oral contrast. One of these is just inferior to the liver in tracks down the right paracolic gutter. The second is in the right lower quadrant mesentery. Given the history of recent surgery both could represent intraperitoneal abscess. However, with the history of appendiceal carcinoma, pseudomyxoma peritonei is also a consideration. 2. No evidence for bowel obstruction. No bowel wall thickening is evident. There is fairly diffuse mesenteric edema and vascular congestion with scattered collections of interloop mesenteric fluid.  The degree of these changes is more advanced than would be expected 3 weeks out from surgery. 3. Dependent atelectasis in both lower lungs with moderate right and small left pleural effusions. 4. Tiny air bubble in the urinary bladder may be from recent instrumentation, but bladder infection can have this appearance. 5. Thickening of the endometrial stripe in this patient with history of cervical cancer. Electronically Signed   By: Misty Stanley M.D.   On: 06/23/2017 20:32   Ct Head Wo Contrast  Result Date: 07/04/2017 CLINICAL DATA:  Follow-up CVA EXAM: CT HEAD WITHOUT CONTRAST TECHNIQUE: Contiguous axial images were obtained from the base of the skull through the vertex without intravenous contrast. COMPARISON:  06/28/2017; brain MRI - 07/01/2017; 06/28/2017 FINDINGS: Brain: Re- demonstrated atrophy with sulcal prominence. Old right frontal lobe infarct with associated dystrophic calcifications. Old left cerebellar hemisphere infarct / postoperative change. Periventricular hypodensities compatible microvascular ischemic disease. Suspected punctate evolving lacunar infarct at previously suspected tiny left thalamic infarct (image 14, series 3). Given extensive background parenchymal abnormalities, there is no CT evidence superimposed acute large territory infarct. No intraparenchymal or extra-axial mass or hemorrhage as previously noted tiny bilateral subdural hematomas are not well demonstrated on the present examination. Vascular: Intracranial atherosclerosis. Skull: Stable sequela of left suboccipital craniotomy. Old right frontal lobe burr hole. No displaced calvarial fracture. Sinuses/Orbits: Scattered opacification of the bilateral frontal sinuses as well as the left anterior ethmoidal air cells, unchanged. Unchanged punctate (approximately 0.5 cm) osteoid osteoma within the left anterior ethmoidal air cell (image 15, series 4). Remaining paranasal sinuses and mastoid air cells are normally aerated. No  air-fluid levels. Other: Regional soft tissues appear normal. IMPRESSION: 1. Suspected evolving encephalomalacia at suspected left thalamic lacunar infarct. No superimposed acute large territory infarct. 2. Previously identified tiny bilateral subdural hematoma as not definitely seen on the present examination. No new or enlarging intraparenchymal or extra-axial hemorrhage. 3. Stable sequela of prior right frontal lobe infarct. 4. Stable sequela of prior left occipital lobe infarct/postsurgical change. Electronically Signed   By: Sandi Mariscal M.D.   On: 07/04/2017 10:01   Mr Brain Wo Contrast  Result Date: 06/28/2017 CLINICAL DATA:  66 y/o F; patient presenting with increased lethargy, garbled speech, and facial droop. History seizures, hemorrhagic aneurysm, cervical and metastatic appendiceal carcinoma, and recent abdominal surgery. EXAM: MRI HEAD WITHOUT CONTRAST TECHNIQUE: Multiplanar, multiecho pulse sequences of the brain and surrounding structures were obtained without intravenous contrast. COMPARISON:  06/28/2017 CT of the head. FINDINGS: Brain: No diffusion signal abnormality to suggest acute or early subacute infarction. Stable left cerebellar hemisphere encephalomalacia. Stable right frontal lobe encephalomalacia and  with central mineralization. Moderate diffuse brain parenchymal volume loss. Scattered foci of T2 FLAIR hyperintense signal abnormality in periventricular white matter are nonspecific but compatible with chronic microvascular ischemic changes. There is diffusely increased susceptibility hypointensity throughout the vascular structures of the brain. Vascular: Normal flow voids. Skull and upper cervical spine: Normal marrow signal. Postsurgical changes related to a left suboccipital craniectomy. Sinuses/Orbits: Partial opacification of the left-greater-than-right frontal sinuses and left anterior ethmoid air cells. Other: None. IMPRESSION: 1. No acute intracranial abnormality identified. 2.  Left cerebellar hemisphere encephalomalacia and right frontal lobe encephalomalacia with central mineralization. 3. Diffusely increased susceptibility hypointensity throughout vascular structures in the brain, question recent iron replacement therapy. A component of siderosis may also be present. 4. Partial opacification of left-greater-than-right frontal sinuses and left anterior ethmoid air cells. 5. Mild chronic microvascular ischemic changes and moderate parenchymal volume loss of the brain. Electronically Signed   By: Kristine Garbe M.D.   On: 06/28/2017 22:25   Mr Jeri Cos NT Contrast  Result Date: 07/01/2017 CLINICAL DATA:  Encephalopathy. History of seizure disorder and metastatic appendiceal cancer. EXAM: MRI HEAD WITHOUT AND WITH CONTRAST TECHNIQUE: Multiplanar, multiecho pulse sequences of the brain and surrounding structures were obtained without and with intravenous contrast. CONTRAST:  20 mL MultiHance COMPARISON:  06/28/2017 noncontrast brain MRI FINDINGS: The study is severely motion degraded. Brain: There is a 2 mm focus of mildly increased trace diffusion signal and apparently reduced ADC in the dorsal thalamus on axial diffusion imaging, not confirmed on coronal images although assessment is limited by slice selection. No acute infarct is identified elsewhere. Chronic right frontal lobe encephalomalacia is again noted with central calcification. There is also chronic left cerebellar encephalomalacia. Scattered T2 hyperintensities elsewhere in the cerebral white matter bilaterally are nonspecific but compatible with mild chronic small vessel ischemic disease. There is new abnormal T1 and FLAIR hyperintensity in the extra-axial space overlying the posterior cerebral convexities bilaterally most compatible with thin subdural hematomas measuring 2 mm in thickness each. There is no associated mass effect. A small amount of abnormal sulcal FLAIR hyperintensity at the vertex on the right  (series 8, image 24) is favored to reflect artifactual incomplete FLAIR suppression due to regional susceptibility without evidence of subarachnoid hemorrhage elsewhere. There is moderate cerebral atrophy. No abnormal enhancement is identified to suggest metastatic disease. Vascular: Major intracranial vascular flow voids are preserved. Skull and upper cervical spine: Diffusely diminished bone marrow signal intensity likely related to patient's anemia. Sinuses/Orbits: Unremarkable orbits. Similar appearance of partial left frontal sinus opacification. No significant scratched a trace bilateral mastoid fluid. Other: None. IMPRESSION: 1. Severely motion degraded examination. 2. New tiny bilateral subdural hematomas over both posterior cerebral convexities. No mass effect. 3. Minimal sulcal signal abnormality at the right vertex favored to reflect artifact rather than trace subarachnoid hemorrhage. 4. Punctate focus of diffusion abnormality in the dorsal thalamus, favor artifact over tiny infarct. 5. 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 6. No evidence of metastatic disease within limitations of motion. Critical Value/emergent results were called by telephone at the time of interpretation on 07/01/2017 at 2:01 pm to Dr. [place] Pink, who verbally acknowledged these results. Electronically Signed   By: Logan Bores M.D.   On: 07/01/2017 14:05   Dg Swallowing Func-speech Pathology  Result Date: 07/08/2017 Objective Swallowing Evaluation: Type of Study: MBS-Modified Barium Swallow Study Patient Details Name: Carmell Elgin MRN: [id number] Date of Birth: March 08, 1951 Today's Date: 07/08/2017 Time: SLP Start Time (ACUTE ONLY): 1335-SLP Stop Time (ACUTE ONLY):  1355 SLP Time Calculation (min) (ACUTE ONLY): 20 min Past Medical History: Past Medical History: Diagnosis Date . Anemia 2017  3u blood 2017 . Arthritis   KNEES . Carcinoma of appendix ([place]) 04/11/2016  Patient with a history of cervical cancer status  post chemoradiation with a persistent finding of a dilated appendix with hypermetabolism. This is raised a concern of malignancy. We'll plan to move forward with laparoscopic possible open appendectomy to rule out malignancy.  . Cervical cancer, FIGO stage IIB ([place]) 05/24/2015 . 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 Depression  . Endometrial carcinoma [facility])   goes to [facility] in [place] for chemotherapy . GERD (gastroesophageal reflux disease)  . Heart murmur  . History of blood transfusion  . History of cerebral aneurysm repair   1997--  hemarrhagic aneurysm x2 --- s/p  left posterior fossa craniectomy . History of radiation therapy 07/28/15-08/26/15  cervical region [age] [address] . History of seizures   post-op craniotomy for aneurysm 1997-- controlled w/ medication since then no seizures . Hyperlipidemia  . Hypertension  . PMB (postmenopausal bleeding)  . Pulmonary nodule 03/26/2017 . Seizures ([place])   hx of years ago  . Type 2 diabetes, diet controlled ([place])   diet controlled  Past Surgical History: Past Surgical History: Procedure Laterality Date . COLONOSCOPY   . CRANIOTOMY POSTERIOR FOSSA  1997  Repair aneurysm  x2  left side . LAPAROSCOPIC APPENDECTOMY N/A 04/19/2016  Procedure: APPENDECTOMY LAPAROSCOPIC;  Surgeon: Hall Busing, MD;  Location: [facility];  Service: General;  Laterality: N/A; . LAPAROSCOPY N/A 05/30/2017  Procedure: LAPAROSCOPY DIAGNOSTIC WITH PERITONEAL BIOPSY;  Surgeon: Jackolyn Confer, MD;  Location: [facility];  Service: General;  Laterality: N/A; . LAPAROTOMY  05/30/2017  Procedure: EXPLORATORY LAPAROTOMY REPAIR WITH DRAINAGE OF INTRA ABDOMINAL ABSCESS OF SMALL BOWEL PERFORATION WITH ENTEROCOLOSTOMY;  Surgeon: Jackolyn Confer, MD;  Location: [facility];  Service: General;; . PORT-A-CATH PLACEMENT  06/  2016 . TANDEM RING INSERTION N/A 07/28/2015  Procedure:  TANDEM RING PLACEMENT;  Surgeon: Gery Pray, MD;  Location: [facility];  Service: Urology;  Laterality: N/A; . TANDEM RING INSERTION N/A 08/05/2015  Procedure: TANDEM RING PLACEMENT ;  Surgeon: Gery Pray, MD;  Location: [facility];  Service: Urology;  Laterality: N/A; . TANDEM RING INSERTION N/A 08/11/2015  Procedure: TANDEM RING PLACEMENT ;  Surgeon: Gery Pray, MD;  Location: [facility];  Service: Urology;  Laterality: N/A; . TANDEM RING INSERTION N/A 08/17/2015  Procedure: TANDEM RING PLACEMENT ;  Surgeon: Gery Pray, MD;  Location: [facility];  Service: Urology;  Laterality: N/A; . 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 She was recently hospitalized for small bowel obstruction and underwent diagnostic laparoscopy in June. She underwent repair of small bowel perforation. She had a prolonged hospital stay. Subsequently was discharged to skilled nursing facility. Presented back with rectal bleeding. Seen by oncology. Not thought to be a candidate for any treatment. Then she had what appeared to be a seizure resulting in encephalopathy. Neurology was consulted;  pt with acute left thalamic CVA, bilateral small SDH. Initially evaluated by SLP 06/29/17 with recommendations for dys 2, thin liquids, however pt made NPO with cortrak placed per MD with change in MS, SLP s/o on 7/31 as pt remained unresponsive.  Subjective: "i just want some coffee" Assessment / Plan / Recommendation CHL IP CLINICAL IMPRESSIONS 07/08/2017 Clinical Impression Patient presents  with moderate oral and mild pharyngeal phase dysphagia which appears cognitively and sensory-based. Oral stage of swallowing notable for intermittent oral holding, delayed oral transit, impaired mastication and weak lingual manipulation. Swallow initiation was delayed to level of the pyriform sinuses with thin liquids, resulting in deep penetration before the swallow with larger cup sips of thin liquids, and silent aspiration x1 with straw sip. Cued throat clear sufficient for airway clearance. Adequate tongue base retraction and pharyngeal constriction; noted sluggish hyolaryngeal excursion which intermittently slowed bolus passage through the UES. No abnormal residue remained in the pharynx after the swallow with any consistency trialed. Recommend initiating dys 2 diet with thin liquids, no straws, full supervision to monitor for small bites/sips. Cue pt for small, single sips of liquid, clear throat intermittently. SLP will monitor for tolerance, advancement of solids. Pt may benefit from removal of cortrak to facilitate comfort with PO intake.  SLP Visit Diagnosis Dysphagia, oropharyngeal phase (R13.12) Attention and concentration deficit following -- Frontal lobe and executive function deficit following -- Impact on safety and function Mild aspiration risk;Moderate aspiration risk   CHL IP TREATMENT RECOMMENDATION 07/08/2017 Treatment Recommendations Therapy as outlined in treatment plan below   Prognosis 07/08/2017 Prognosis for Safe Diet Advancement Good Barriers to Reach Goals Cognitive deficits Barriers/Prognosis Comment -- CHL IP DIET RECOMMENDATION 07/08/2017 SLP Diet Recommendations Dysphagia 2 (Fine chop) solids;Thin liquid Liquid Administration via Cup;No straw Medication Administration Whole meds with puree Compensations Slow rate;Small sips/bites;Clear throat intermittently Postural Changes Seated upright at 90 degrees   CHL IP OTHER RECOMMENDATIONS 07/08/2017 Recommended Consults -- Oral Care  Recommendations Oral care BID Other Recommendations --   CHL IP FOLLOW UP RECOMMENDATIONS 07/08/2017 Follow up Recommendations Skilled Nursing facility   [facility] IP FREQUENCY AND DURATION 07/08/2017 Speech Therapy Frequency (ACUTE ONLY) min 2x/week Treatment Duration 2 weeks      CHL IP ORAL PHASE 07/08/2017 Oral Phase Impaired Oral - Pudding Teaspoon -- Oral - Pudding Cup -- Oral - Honey Teaspoon -- Oral - Honey Cup -- Oral - Nectar Teaspoon Holding of bolus;Delayed oral transit Oral - Nectar Cup Holding of bolus;Delayed oral transit Oral - Nectar Straw Holding of bolus;Delayed oral transit Oral - Thin Teaspoon -- Oral - Thin Cup Holding of bolus;Delayed oral transit Oral - Thin Straw Holding of bolus;Delayed oral transit Oral - Puree Holding of bolus;Delayed oral transit;Reduced posterior propulsion Oral - Mech Soft -- Oral - Regular Impaired mastication;Delayed oral transit;Reduced posterior propulsion Oral - Multi-Consistency -- Oral - Pill Holding of bolus;Lingual pumping;Weak lingual manipulation;Delayed oral transit Oral Phase - Comment --  CHL IP PHARYNGEAL PHASE 07/08/2017 Pharyngeal Phase Impaired Pharyngeal- Pudding Teaspoon -- Pharyngeal -- Pharyngeal- Pudding Cup -- Pharyngeal -- Pharyngeal- Honey Teaspoon -- Pharyngeal -- Pharyngeal- Honey Cup -- Pharyngeal -- Pharyngeal- Nectar Teaspoon -- Pharyngeal -- Pharyngeal- Nectar Cup Delayed swallow initiation-vallecula Pharyngeal -- Pharyngeal- Nectar Straw -- Pharyngeal -- Pharyngeal- Thin Teaspoon -- Pharyngeal -- Pharyngeal- Thin Cup Delayed swallow initiation-pyriform sinuses;Penetration/Aspiration before swallow Pharyngeal Material does not enter airway;Material enters airway, CONTACTS cords and not ejected out Pharyngeal- Thin Straw Delayed swallow initiation-pyriform sinuses;Penetration/Aspiration before swallow;Trace aspiration Pharyngeal Material enters airway, CONTACTS  cords and not ejected out;Material enters airway, passes BELOW cords without attempt by  patient to eject out (silent aspiration) Pharyngeal- Puree Delayed swallow initiation-vallecula Pharyngeal -- Pharyngeal- Mechanical Soft -- Pharyngeal -- Pharyngeal- Regular Delayed swallow initiation-vallecula Pharyngeal -- Pharyngeal- Multi-consistency -- Pharyngeal -- Pharyngeal- Pill -- Pharyngeal -- Pharyngeal Comment --  CHL IP CERVICAL ESOPHAGEAL PHASE 07/08/2017 Cervical Esophageal Phase WFL Pudding Teaspoon -- Pudding Cup -- Honey Teaspoon -- Honey Cup -- Nectar Teaspoon -- Nectar Cup -- Nectar Straw -- Thin Teaspoon -- Thin Cup -- Thin Straw -- Puree -- Mechanical Soft -- Regular -- Multi-consistency -- Pill -- Cervical Esophageal Comment -- No flowsheet data found. Aliene Altes 07/08/2017, 3:04 PM  Deneise Lever, MS, CCC-SLP Speech-Language Pathologist [phone]             Ct Head Code Stroke Wo Contrast  Result Date: 06/28/2017 CLINICAL DATA:  Code stroke.  Left facial droop. EXAM: CT HEAD WITHOUT CONTRAST TECHNIQUE: Contiguous axial images were obtained from the base of the skull through the vertex without intravenous contrast. COMPARISON:  06/17/2017 FINDINGS: Brain: A moderate-sized region of encephalomalacia with associated calcification in the high right frontal lobe is unchanged. Left cerebellar encephalomalacia underlying a craniectomy defect is also unchanged. There is no evidence of acute infarct, mass, midline shift, or extra-axial fluid collection. Cerebral atrophy is unchanged. Patchy bilateral cerebral white matter hypodensities are similar to the prior study and nonspecific but compatible with moderate chronic small vessel ischemic disease. Vascular: Calcified atherosclerosis at the skullbase. No hyperdense vessel. Skull: Left suboccipital craniectomy. Right frontal burr hole. No acute fracture or suspicious osseous lesion. Sinuses/Orbits: Slightly progressive left frontal sinus mucosal thickening. Clear mastoid air cells. Unremarkable orbits. Other: None. ASPECTS [facility] Stroke  Program Early CT Score) Not scored due to chronic right frontal encephalomalacia changes. IMPRESSION: 1. No evidence of acute intracranial abnormality. 2. 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 Results sent via text page to Dr. Rory Percy on 06/28/2017 at 2:54 p.m. Electronically Signed   By: Logan Bores M.D.   On: 06/28/2017 14:54       Subjective: atient seen and examined at bedside. She is awake and answers some questions but does not communicate much.   Discharge Exam: Vitals:   07/20/17 2353 07/21/17 0557  BP: 96/66 114/71  Pulse: (!) 116 (!) 125  Resp:  17  Temp:  (!) 97.4 F (36.3 C)  SpO2:  98%   Vitals:   07/20/17 2113 07/20/17 2115 07/20/17 2353 07/21/17 0557  BP: (!) 89/63 91/64 96/66  114/71  Pulse: (!) 112  (!) 116 (!) 125  Resp: 17   17  Temp: 99.4 F (37.4 C)   (!) 97.4 F (36.3 C)  TempSrc: Oral     SpO2: 100%   98%  Weight:    86.1 kg (189 lb 14.4 oz)  Height:        General: Pt is alert, awake; looks older than stated age.Very deconditioned; NG tube in place Cardiovascular: tachycardic, S1/S2 + Respiratory: bilateral decreased breath sounds at bases with scattered crackles Abdominal: Soft, NT, ND, bowel sounds +, dressing in place Extremities: trace edema, no cyanosis    The results of significant diagnostics from this hospitalization (including imaging, microbiology, ancillary and laboratory) are listed below for reference.     Microbiology: Recent Results (from the past 240 hour(s))  C difficile quick scan w PCR reflex     Status: None   Collection Time: 07/20/17 12:40 PM  Result Value Ref Range  Status   C Diff antigen NEGATIVE NEGATIVE Final   C Diff toxin NEGATIVE NEGATIVE Final   C Diff interpretation No C. difficile detected.  Final  Gastrointestinal Panel by PCR , Stool     Status: None   Collection Time: 07/20/17 12:40 PM  Result Value Ref Range Status   Campylobacter species NOT DETECTED NOT DETECTED Final   Plesimonas  shigelloides NOT DETECTED NOT DETECTED Final   Salmonella species NOT DETECTED NOT DETECTED Final   Yersinia enterocolitica NOT DETECTED NOT DETECTED Final   Vibrio species NOT DETECTED NOT DETECTED Final   Vibrio cholerae NOT DETECTED NOT DETECTED Final   Enteroaggregative E coli (EAEC) NOT DETECTED NOT DETECTED Final   Enteropathogenic E coli (EPEC) NOT DETECTED NOT DETECTED Final   Enterotoxigenic E coli (ETEC) NOT DETECTED NOT DETECTED Final   Shiga like toxin producing E coli (STEC) NOT DETECTED NOT DETECTED Final   Shigella/Enteroinvasive E coli (EIEC) NOT DETECTED NOT DETECTED Final   Cryptosporidium NOT DETECTED NOT DETECTED Final   Cyclospora cayetanensis NOT DETECTED NOT DETECTED Final   Entamoeba histolytica NOT DETECTED NOT DETECTED Final   Giardia lamblia NOT DETECTED NOT DETECTED Final   Adenovirus F40/41 NOT DETECTED NOT DETECTED Final   Astrovirus NOT DETECTED NOT DETECTED Final   Norovirus GI/GII NOT DETECTED NOT DETECTED Final   Rotavirus A NOT DETECTED NOT DETECTED Final   Sapovirus (I, II, IV, and V) NOT DETECTED NOT DETECTED Final     Labs: BNP (last 3 results) No results for input(s): BNP in the last 8760 hours. Basic Metabolic Panel:  Recent Labs Lab 07/15/17 0408 07/17/17 0428 07/18/17 0404 07/19/17 0449 07/20/17 0406 07/21/17 0514  NA 136 133* 134* 136 137 142  K 4.0 3.8 4.0 4.1 3.7 3.4*  CL 102 100* 99* 100* 99* 104  CO2 29 27 28 29 30 26   GLUCOSE 100* 106* 166* 124* 166* 222*  BUN 9 5* 7 10 18  38*  CREATININE 0.38* 0.52 0.49 0.46 0.57 0.80  CALCIUM 8.1* 7.8* 7.8* 7.8* 8.2* 8.4*  MG 1.5*  --   --  1.3* 1.6* 2.1   Liver Function Tests: No results for input(s): AST, ALT, ALKPHOS, BILITOT, PROT, ALBUMIN in the last 168 hours. No results for input(s): LIPASE, AMYLASE in the last 168 hours. No results for input(s): AMMONIA in the last 168 hours. CBC:  Recent Labs Lab 07/17/17 0428 07/18/17 0404 07/19/17 0449 07/20/17 0406 07/21/17 0514   WBC 7.8 8.4 8.7 10.3 10.8*  NEUTROABS  --   --  6.7 8.3* 8.7*  HGB 7.1* 7.3* 7.5* 8.3* 8.7*  HCT 23.2* 23.9* 24.5* 27.4* 28.6*  MCV 87.2 87.2 87.5 87.8 89.4  PLT 343 365 384 586* 632*   Cardiac Enzymes: No results for input(s): CKTOTAL, CKMB, CKMBINDEX, TROPONINI in the last 168 hours. BNP: Invalid input(s): POCBNP CBG:  Recent Labs Lab 07/20/17 0754 07/20/17 1219 07/20/17 1717 07/20/17 2109 07/21/17 0848  GLUCAP 162* 192* 178* 162* 215*   D-Dimer No results for input(s): DDIMER in the last 72 hours. Hgb A1c No results for input(s): HGBA1C in the last 72 hours. Lipid Profile No results for input(s): CHOL, HDL, LDLCALC, TRIG, CHOLHDL, LDLDIRECT in the last 72 hours. Thyroid function studies No results for input(s): TSH, T4TOTAL, T3FREE, THYROIDAB in the last 72 hours.  Invalid input(s): FREET3 Anemia work up No results for input(s): VITAMINB12, FOLATE, FERRITIN, TIBC, IRON, RETICCTPCT in the last 72 hours. Urinalysis    Component Value Date/Time   COLORURINE  STRAW (A) 07/02/2017 1704   APPEARANCEUR CLEAR 07/02/2017 1704   LABSPEC 1.005 07/02/2017 1704   LABSPEC 1.010 08/17/2015 1542   PHURINE 8.0 07/02/2017 1704   GLUCOSEU NEGATIVE 07/02/2017 1704   GLUCOSEU 100 08/17/2015 1542   HGBUR SMALL (A) 07/02/2017 1704   BILIRUBINUR NEGATIVE 07/02/2017 1704   BILIRUBINUR Negative 08/17/2015 1542   KETONESUR NEGATIVE 07/02/2017 1704   PROTEINUR NEGATIVE 07/02/2017 1704   UROBILINOGEN 1.0 08/26/2015 0725   UROBILINOGEN 0.2 08/17/2015 1542   NITRITE NEGATIVE 07/02/2017 1704   LEUKOCYTESUR NEGATIVE 07/02/2017 1704   LEUKOCYTESUR Trace 08/17/2015 1542   Sepsis Labs Invalid input(s): PROCALCITONIN,  WBC,  LACTICIDVEN Microbiology Recent Results (from the past 240 hour(s))  C difficile quick scan w PCR reflex     Status: None   Collection Time: 07/20/17 12:40 PM  Result Value Ref Range Status   C Diff antigen NEGATIVE NEGATIVE Final   C Diff toxin NEGATIVE NEGATIVE  Final   C Diff interpretation No C. difficile detected.  Final  Gastrointestinal Panel by PCR , Stool     Status: None   Collection Time: 07/20/17 12:40 PM  Result Value Ref Range Status   Campylobacter species NOT DETECTED NOT DETECTED Final   Plesimonas shigelloides NOT DETECTED NOT DETECTED Final   Salmonella species NOT DETECTED NOT DETECTED Final   Yersinia enterocolitica NOT DETECTED NOT DETECTED Final   Vibrio species NOT DETECTED NOT DETECTED Final   Vibrio cholerae NOT DETECTED NOT DETECTED Final   Enteroaggregative E coli (EAEC) NOT DETECTED NOT DETECTED Final   Enteropathogenic E coli (EPEC) NOT DETECTED NOT DETECTED Final   Enterotoxigenic E coli (ETEC) NOT DETECTED NOT DETECTED Final   Shiga like toxin producing E coli (STEC) NOT DETECTED NOT DETECTED Final   Shigella/Enteroinvasive E coli (EIEC) NOT DETECTED NOT DETECTED Final   Cryptosporidium NOT DETECTED NOT DETECTED Final   Cyclospora cayetanensis NOT DETECTED NOT DETECTED Final   Entamoeba histolytica NOT DETECTED NOT DETECTED Final   Giardia lamblia NOT DETECTED NOT DETECTED Final   Adenovirus F40/41 NOT DETECTED NOT DETECTED Final   Astrovirus NOT DETECTED NOT DETECTED Final   Norovirus GI/GII NOT DETECTED NOT DETECTED Final   Rotavirus A NOT DETECTED NOT DETECTED Final   Sapovirus (I, II, IV, and V) NOT DETECTED NOT DETECTED Final     Time coordinating discharge: 50 minutes  SIGNED:   Aline August, MD  [facility] 07/21/2017, 11:09 AM Pager: [phone]  If 7PM-7AM, please contact night-coverage [email or web address] Password [email or web address]

## 2017-07-21 NOTE — Progress Notes (Signed)
Dressing has been changed

## 2017-07-21 NOTE — Progress Notes (Signed)
Received lab result elevated lactic acid 3.2 paged on call provider Blount NP and 500cc NS bolus given in addition to the 4L already received for low BP. Dr. Hector Brunswick came to bedside to assess patient due to abnormal CT scan. She wants to remove coretrak and place NGT and hold tube feeding. She sat and had long discussion with son. Patient continues to be unresponsive I will continue to monitor.

## 2017-07-21 NOTE — Progress Notes (Signed)
Pharmacy Antibiotic Note  Lori [name] is a 66 y.o. female admitted on 06/23/2017 from SNF, prolonged admission. Was ready to leave today then pt became febrile and hypotensive. Planning to start empiric abx for possible intra-abdominal infection. Tm 102.6, hypotensive.   Plan: -Vancomycin 1500 mg IV x1 then 1g/12h -Zosyn 3.375 g IV q8h -Monitor renal fx, cultures, duration of therapy   Height: 5\' 8"  (172.7 cm) Weight: 189 lb 14.4 oz (86.1 kg) (SCD's and seizure pads removed.) IBW/kg (Calculated) : 63.9  Temp (24hrs), Avg:99.8 F (37.7 C), Min:97.4 F (36.3 C), Max:102.6 F (39.2 C)   Recent Labs Lab 07/17/17 0428 07/18/17 0404 07/19/17 0449 07/20/17 0406 07/21/17 0514  WBC 7.8 8.4 8.7 10.3 10.8*  CREATININE 0.52 0.49 0.46 0.57 0.80    Estimated Creatinine Clearance: 79.5 mL/min (by C-G formula based on SCr of 0.8 mg/dL).    Allergies  Allergen Reactions  . Contrast Media [Iodinated Diagnostic Agents] Itching and Swelling  . Dilantin [Phenytoin Sodium Extended] Itching, Swelling and Other (See Comments)    Whole body  . Latex Hives and Itching  . Adhesive [Tape] Rash    Antimicrobials this admission: 8/18 vancomycin > 8/18 zosyn >  Dose adjustments this admission: N/A  Microbiology results: 8/17 GI panel: neg 8/17 cdiff: neg 8/8 mrsa pcr: neg    Harvel Quale 07/21/2017 3:26 PM

## 2017-07-21 NOTE — Progress Notes (Signed)
Paged Dr Starla Link about pt's blood pressure 78/59 and HR 120. New order received for 1Liter bolus. He stated to call critical care if this does not improve blood pressure.

## 2017-07-22 ENCOUNTER — Inpatient Hospital Stay (HOSPITAL_COMMUNITY): Payer: Medicare Other

## 2017-07-22 DIAGNOSIS — A419 Sepsis, unspecified organism: Secondary | ICD-10-CM

## 2017-07-22 DIAGNOSIS — K6389 Other specified diseases of intestine: Secondary | ICD-10-CM

## 2017-07-22 DIAGNOSIS — G40919 Epilepsy, unspecified, intractable, without status epilepticus: Secondary | ICD-10-CM

## 2017-07-22 DIAGNOSIS — R6520 Severe sepsis without septic shock: Secondary | ICD-10-CM

## 2017-07-22 LAB — GLUCOSE, CAPILLARY
GLUCOSE-CAPILLARY: 100 mg/dL — AB (ref 65–99)
Glucose-Capillary: 120 mg/dL — ABNORMAL HIGH (ref 65–99)
Glucose-Capillary: 116 mg/dL — ABNORMAL HIGH (ref 65–99)
Glucose-Capillary: 95 mg/dL (ref 65–99)

## 2017-07-22 LAB — LACTIC ACID, PLASMA
LACTIC ACID, VENOUS: 2.3 mmol/L — AB (ref 0.5–1.9)
LACTIC ACID, VENOUS: 2.4 mmol/L — AB (ref 0.5–1.9)

## 2017-07-22 LAB — BASIC METABOLIC PANEL
ANION GAP: 9 (ref 5–15)
BUN: 47 mg/dL — ABNORMAL HIGH (ref 6–20)
CO2: 22 mmol/L (ref 22–32)
Calcium: 7.4 mg/dL — ABNORMAL LOW (ref 8.9–10.3)
Chloride: 115 mmol/L — ABNORMAL HIGH (ref 101–111)
Creatinine, Ser: 1.06 mg/dL — ABNORMAL HIGH (ref 0.44–1.00)
GFR calc non Af Amer: 53 mL/min — ABNORMAL LOW (ref >60)
Glucose, Bld: 120 mg/dL — ABNORMAL HIGH (ref 65–99)
POTASSIUM: 3.3 mmol/L — AB (ref 3.5–5.1)
Sodium: 146 mmol/L — ABNORMAL HIGH (ref 135–145)

## 2017-07-22 LAB — CBC WITH DIFFERENTIAL/PLATELET
BASOS PCT: 0 %
Basophils Absolute: 0 10*3/uL (ref 0.0–0.1)
EOS ABS: 0 10*3/uL (ref 0.0–0.7)
Eosinophils Relative: 0 %
HCT: 23.9 % — ABNORMAL LOW (ref 36.0–46.0)
Hemoglobin: 7 g/dL — ABNORMAL LOW (ref 12.0–15.0)
LYMPHS ABS: 1 10*3/uL (ref 0.7–4.0)
Lymphocytes Relative: 9 %
MCH: 26.4 pg (ref 26.0–34.0)
MCHC: 29.3 g/dL — AB (ref 30.0–36.0)
MCV: 90.2 fL (ref 78.0–100.0)
MONO ABS: 0.7 10*3/uL (ref 0.1–1.0)
Monocytes Relative: 6 %
NEUTROS PCT: 85 %
Neutro Abs: 9.8 10*3/uL — ABNORMAL HIGH (ref 1.7–7.7)
PLATELETS: 388 10*3/uL (ref 150–400)
RBC: 2.65 MIL/uL — ABNORMAL LOW (ref 3.87–5.11)
RDW: 17.7 % — AB (ref 11.5–15.5)
WBC Morphology: INCREASED
WBC: 11.5 10*3/uL — AB (ref 4.0–10.5)

## 2017-07-22 LAB — URINALYSIS, ROUTINE W REFLEX MICROSCOPIC
BILIRUBIN URINE: NEGATIVE
Glucose, UA: NEGATIVE mg/dL
KETONES UR: NEGATIVE mg/dL
Nitrite: NEGATIVE
PH: 5 (ref 5.0–8.0)
Protein, ur: 30 mg/dL — AB
SQUAMOUS EPITHELIAL / LPF: NONE SEEN
Specific Gravity, Urine: 1.024 (ref 1.005–1.030)

## 2017-07-22 LAB — MAGNESIUM: MAGNESIUM: 1.9 mg/dL (ref 1.7–2.4)

## 2017-07-22 MED ORDER — SODIUM CHLORIDE 0.9 % IV SOLN
1000.0000 mg | Freq: Two times a day (BID) | INTRAVENOUS | Status: DC
  Administered 2017-07-22 – 2017-08-03 (×25): 1000 mg via INTRAVENOUS
  Filled 2017-07-22 (×30): qty 10

## 2017-07-22 MED ORDER — SODIUM CHLORIDE 0.9 % IV BOLUS (SEPSIS)
1000.0000 mL | Freq: Once | INTRAVENOUS | Status: AC
  Administered 2017-07-22: 1000 mL via INTRAVENOUS

## 2017-07-22 MED ORDER — SODIUM CHLORIDE 0.9 % IV SOLN
INTRAVENOUS | Status: DC
  Administered 2017-07-22: 10:00:00 via INTRAVENOUS

## 2017-07-22 MED ORDER — SODIUM CHLORIDE 0.9 % IV SOLN
100.0000 mg | Freq: Two times a day (BID) | INTRAVENOUS | Status: DC
  Administered 2017-07-22 – 2017-08-03 (×24): 100 mg via INTRAVENOUS
  Filled 2017-07-22 (×45): qty 10

## 2017-07-22 MED ORDER — POTASSIUM CHLORIDE IN NACL 40-0.9 MEQ/L-% IV SOLN
INTRAVENOUS | Status: DC
Start: 2017-07-22 — End: 2017-07-23
  Administered 2017-07-22: 125 mL/h via INTRAVENOUS
  Filled 2017-07-22 (×2): qty 1000

## 2017-07-22 NOTE — Progress Notes (Signed)
Pt HR increasing to 160's and 170's, B/P 101/57. Dr Starla Link at bedside. Verbal order given to start NS at 156ml/hr

## 2017-07-22 NOTE — Progress Notes (Signed)
Patient ID: Lori [name], female   DOB: 06-23-1951, 66 y.o.   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 Seen by oncology. Not thought to be a candidate for any treatment. Then she had what appears to be a seizure resulting in encephalopathy. Neurology was consulted. Palliative medicine continues to follow. Family still desires aggressive care. Patient was transferred to stepdown unit. Critical care medicine was consulted. Patient noted to have profuse urine output. 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 Mental status improving. Had MBS, started on dysphagia 2 diet however still poor oral intake, therefore NGT still in place. Patient transitioned to oral Keppra, Tegretol. Mental status improving. Neurology felt no need for lumbar puncture as mental status has been improving. Oral intake is still poor. Patient wants to continue NG tube feeding for now and wants to keep trying to improve oral intake. Overall prognosis is very poor. Patient has refused hospice evaluation. Patient will benefit from continued evaluation and follow-up by palliative care. Patient was supposed to be discharged to nursing home on 07/21/2017 But she had another seizure episode on 07/21/2017. Afterwards she became hypotensive with high fever. She was started on broad-spectrum antibiotics IV fluids and transferred to stepdown unit. Repeat CT head, chest, abdomen and pelvis were done. She was found to have significant pneumatosis suggestive of bowel ischemia and nodules in the  lungs suspicious of metastatic disease. Surgery reevaluated the patient and recommended medical management/palliative care as she is not a good surgical candidate.   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 Patient is not a surgical candidate as per general surgery - Patient is still very tachycardic - Critical care evaluation appreciated. If overall condition worsens, patient might need to be transferred to ICU  Significant pneumatosis secondary to bowel ischemia - Surgery is recommending medical management/palliative care - We'll reconsult palliative care. Family has been still wanting to pursue aggressive measures. -Continue empiric antibiotics - Follow cultures  Breakthrough seizures in a patient with history of seizure disorder -Change Keppra to IV. Neurology follow-up appreciated. CT brain was negative for any acute abnormality - Hold Tegretol for now as patient is nothing by mouth - No active seizures currently. Still drowsy but wakes up slightly as per the nursing report   Acute metabolic encephalopathy/Left Thalamic Stroke/ Bilateral small SDH -Patient was noted to have garbled speech on the afternoon of 06/28/2017 -Neurology consultation appreciated -CT head on 8/1: Evolving encephalomalacia at suspected left thalamic lacunar infarct. -MRI brain on 7/29: New tiny bilateral subdural hematomas, trace  subarachnoid hemorrhage, punctate focus of diffusion  abnormality in the dorsal thalamus -It was thought the patient was having seizure activity and she was loaded with Keppra -Patient has had continuous EEG monitoring: Progressively improving diffuse encephalopathy  -Neurology feltlumbar puncture is no longer necessary given that her mental status is improving -Patient had modified barium swallow, was placed on dysphagia 2 diet - Nothing by mouth for now because of her change in mental status -Mental Status is fluctuating   Diabetes insipidus/polyuria -Patient had excessive urine output thought to be due to central diabetes insipidus due to abnormalities noted on MRI of the brain -Sodium level was also elevated with high serum osmolality 100 -She was given DDAVP  -sodium is 146today -outpatient follow-up  Primary appendiceal carcinoma with metastatic disease, stage IV with abdominal wound, status post treatment of intra-abdominal abscess -Oncology consultation appreciated, patient is not a candidate for chemotherapy and is thought to have an incurable condition. Poor prognosis -Palliative carefollowing -had recent SBO due to carcinomatosis status post laparotomy, drainage of intraabdominal abscess, repair of small bowel perforation. Urineculture grew enterococcus which was treated with IV Unasyn, completed course of antibiotics -Reconsult palliative care attending status  Rectal bleeding of unknown cause -Appears to be stable, patient is not a candidate for endoscopic evaluation due to recent surgery. This was discussed by previous hospitalist with general surgery.  Anemia secondary to acute blood loss - Hemoglobin stable  Status post antibiotic treatment for Intra-abdominal abscess  Diabetes mellitus, type II -Continue insulin sliding scale CBG monitoring  Essential hypertension -Hold antihypertensives because of hypotension  Enterococcus UTI -As above, patient  was on Zosyn however transitioned to and completed Unasyn   Nutrition -Hold NG feeds for now  Hypokalemia -Follow am labs  Goals of care -Reconsult palliative care. Medical care at this point is likely.  Stage II Pressure ulcer of buttocks - follow wound care recommendations   DVT ProphylaxisSCDs  Code Status:Full  Family Communication:None at bedside  Disposition Plan:unclear at this timePending improvement in oral intake. SNF vs LTAC  Consultants PCCM General surgery Neurology  Oncology Palliative care  Procedures  Continuous EEG  Antimicrobials:  Anti-infectives    Start     Dose/Rate Route Frequency Ordered Stop   07/22/17 0500  vancomycin (VANCOCIN) IVPB 1000 mg/200 mL premix     1,000 mg 200 mL/hr over 60 Minutes Intravenous Every 12 hours 07/21/17 1545     07/21/17 2200  piperacillin-tazobactam (ZOSYN) IVPB 3.375 g     3.375 g 12.5 mL/hr over 240 Minutes Intravenous Every 8 hours 07/21/17 1545     07/21/17 1630  vancomycin (VANCOCIN) 1,500 mg in sodium chloride 0.9 % 500 mL IVPB     1,500 mg 250 mL/hr over 120 Minutes Intravenous  Once 07/21/17 1545 07/22/17 0000   07/21/17 1500  piperacillin-tazobactam (ZOSYN) IVPB 3.375 g     3.375 g 100 mL/hr over 30 Minutes Intravenous  Once 07/21/17 1449 07/21/17 1638   07/04/17 1100  Ampicillin-Sulbactam (UNASYN) 3 g in sodium chloride 0.9 % 100 mL IVPB     3 g 200 mL/hr over 30 Minutes Intravenous Every 6 hours 07/04/17 0823 07/10/17 0429   07/03/17 1600  Ampicillin-Sulbactam (UNASYN) 3 g in sodium chloride 0.9 % 100 mL IVPB  Status:  Discontinued     3 g 200 mL/hr over 30 Minutes Intravenous Every 6 hours 07/03/17 1047 07/04/17 0823   06/29/17 1400  ceFEPIme (MAXIPIME) 2 g in dextrose 5 % 50 mL IVPB  Status:  Discontinued     2 g  100 mL/hr over 30 Minutes Intravenous Every 8 hours 06/29/17 0853 06/29/17 0857   06/29/17 1000  ceFEPIme (MAXIPIME) 2 g in dextrose 5 % 50 mL IVPB  Status:   Discontinued     2 g 100 mL/hr over 30 Minutes Intravenous Every 8 hours 06/29/17 0857 07/03/17 1046   06/29/17 0930  metroNIDAZOLE (FLAGYL) IVPB 500 mg  Status:  Discontinued     500 mg 100 mL/hr over 60 Minutes Intravenous Every 8 hours 06/29/17 0838 06/30/17 0956   06/24/17 0600  piperacillin-tazobactam (ZOSYN) IVPB 3.375 g  Status:  Discontinued     3.375 g 12.5 mL/hr over 240 Minutes Intravenous Every 8 hours 06/23/17 2254 06/29/17 0838   06/23/17 2300  piperacillin-tazobactam (ZOSYN) IVPB 3.375 g     3.375 g 100 mL/hr over 30 Minutes Intravenous  Once 06/23/17 2254 06/24/17 0056       Subjective: Patient seen and examined at bedside. She is very drowsy and does not wake up on calling her name.  Objective: Vitals:   07/22/17 0500 07/22/17 0700 07/22/17 0800 07/22/17 0900  BP: 90/62  99/65 (!) 101/57  Pulse: (!) 129 (!) 126 (!) 150 (!) 124  Resp: (!) 23 (!) 24 (!) 23 20  Temp:  99.8 F (37.7 C)    TempSrc:  Axillary    SpO2: 98%  100% 100%  Weight:      Height:        Intake/Output Summary (Last 24 hours) at 07/22/17 1114 Last data filed at 07/22/17 1100  Gross per 24 hour  Intake           199.58 ml  Output             3100 ml  Net         -2900.42 ml   Filed Weights   07/19/17 0500 07/21/17 0557 07/22/17 0428  Weight: 86 kg (189 lb 9.5 oz) 86.1 kg (189 lb 14.4 oz) 87.2 kg (192 lb 3.1 oz)    Examination:  General exam: Appears Very ill and older than stated age; looks deconditioned  Respiratory system: Bilateral decreased breath sound at bases with scattered crackles Cardiovascular system: S1 & S2 heard, tachycardic Gastrointestinal system: Abdomen is distended, dressing present. Bowel sounds sluggish Central nervous system: Almost unresponsive  Extremities: No cyanosis, clubbing; trace edema  Skin: No rashes, lesions or ulcers Lymph: No cervical lymph adenopathy    Data Reviewed: I have personally reviewed following labs and imaging  studies  CBC:  Recent Labs Lab 07/19/17 0449 07/20/17 0406 07/21/17 0514 07/21/17 1800 07/22/17 0356  WBC 8.7 10.3 10.8* 12.3* 11.5*  NEUTROABS 6.7 8.3* 8.7* 10.0* 9.8*  HGB 7.5* 8.3* 8.7* 8.1* 7.0*  HCT 24.5* 27.4* 28.6* 26.9* 23.9*  MCV 87.5 87.8 89.4 91.2 90.2  PLT 384 586* 632* 465* 944   Basic Metabolic Panel:  Recent Labs Lab 07/19/17 0449 07/20/17 0406 07/21/17 0514 07/21/17 1800 07/22/17 0356  NA 136 137 142 144 146*  K 4.1 3.7 3.4* 3.0* 3.3*  CL 100* 99* 104 110 115*  CO2 29 30 26 23 22   GLUCOSE 124* 166* 222* 156* 120*  BUN 10 18 38* 46* 47*  CREATININE 0.46 0.57 0.80 1.26* 1.06*  CALCIUM 7.8* 8.2* 8.4* 7.8* 7.4*  MG 1.3* 1.6* 2.1  --  1.9   GFR: Estimated Creatinine Clearance: 60.3 mL/min (A) (by C-G formula based on SCr of 1.06 mg/dL (H)). Liver Function Tests:  Recent Labs Lab 07/21/17 1800  AST 28  ALT 12*  ALKPHOS 150*  BILITOT 0.5  PROT 5.4*  ALBUMIN 1.4*   No results for input(s): LIPASE, AMYLASE in the last 168 hours. No results for input(s): AMMONIA in the last 168 hours. Coagulation Profile:  Recent Labs Lab 07/21/17 1800  INR 1.66   Cardiac Enzymes: No results for input(s): CKTOTAL, CKMB, CKMBINDEX, TROPONINI in the last 168 hours. BNP (last 3 results) No results for input(s): PROBNP in the last 8760 hours. HbA1C: No results for input(s): HGBA1C in the last 72 hours. CBG:  Recent Labs Lab 07/21/17 1137 07/21/17 1520 07/21/17 1743 07/21/17 2219 07/22/17 0739  GLUCAP 214* 161* 145* 112* 100*   Lipid Profile: No results for input(s): CHOL, HDL, LDLCALC, TRIG, CHOLHDL, LDLDIRECT in the last 72 hours. Thyroid Function Tests: No results for input(s): TSH, T4TOTAL, FREET4, T3FREE, THYROIDAB in the last 72 hours. Anemia Panel: No results for input(s): VITAMINB12, FOLATE, FERRITIN, TIBC, IRON, RETICCTPCT in the last 72 hours. Sepsis Labs:  Recent Labs Lab 07/21/17 1800 07/22/17 0052 07/22/17 0356  PROCALCITON 2.01   --   --   LATICACIDVEN 3.2* 2.3* 2.4*    Recent Results (from the past 240 hour(s))  C difficile quick scan w PCR reflex     Status: None   Collection Time: 07/20/17 12:40 PM  Result Value Ref Range Status   C Diff antigen NEGATIVE NEGATIVE Final   C Diff toxin NEGATIVE NEGATIVE Final   C Diff interpretation No C. difficile detected.  Final  Gastrointestinal Panel by PCR , Stool     Status: None   Collection Time: 07/20/17 12:40 PM  Result Value Ref Range Status   Campylobacter species NOT DETECTED NOT DETECTED Final   Plesimonas shigelloides NOT DETECTED NOT DETECTED Final   Salmonella species NOT DETECTED NOT DETECTED Final   Yersinia enterocolitica NOT DETECTED NOT DETECTED Final   Vibrio species NOT DETECTED NOT DETECTED Final   Vibrio cholerae NOT DETECTED NOT DETECTED Final   Enteroaggregative E coli (EAEC) NOT DETECTED NOT DETECTED Final   Enteropathogenic E coli (EPEC) NOT DETECTED NOT DETECTED Final   Enterotoxigenic E coli (ETEC) NOT DETECTED NOT DETECTED Final   Shiga like toxin producing E coli (STEC) NOT DETECTED NOT DETECTED Final   Shigella/Enteroinvasive E coli (EIEC) NOT DETECTED NOT DETECTED Final   Cryptosporidium NOT DETECTED NOT DETECTED Final   Cyclospora cayetanensis NOT DETECTED NOT DETECTED Final   Entamoeba histolytica NOT DETECTED NOT DETECTED Final   Giardia lamblia NOT DETECTED NOT DETECTED Final   Adenovirus F40/41 NOT DETECTED NOT DETECTED Final   Astrovirus NOT DETECTED NOT DETECTED Final   Norovirus GI/GII NOT DETECTED NOT DETECTED Final   Rotavirus A NOT DETECTED NOT DETECTED Final   Sapovirus (I, II, IV, and V) NOT DETECTED NOT DETECTED Final         Radiology Studies: Ct Abdomen Pelvis Wo Contrast  Addendum Date: 07/21/2017   ADDENDUM REPORT: 07/21/2017 20:51 ADDENDUM: Critical Value/emergent results were called by telephone at the time of interpretation on 07/21/2017 at 8:51 pm to Lovey Newcomer, NP, who verbally acknowledged these results  and communicated to the surgical team involved. Electronically Signed   By: Inez Catalina M.D.   On: 07/21/2017 20:51   Result Date: 07/21/2017 CLINICAL DATA:  Abdominal pain and fevers EXAM: CT CHEST, ABDOMEN AND PELVIS WITHOUT CONTRAST TECHNIQUE: Multidetector CT imaging of the chest, abdomen and pelvis was performed following the standard protocol without IV contrast. COMPARISON:  05/23/2017, 02/02/2017 FINDINGS: CT CHEST FINDINGS Cardiovascular: Atherosclerotic  calcifications are noted without aneurysmal dilatation. Coronary calcifications are seen. No significant cardiac enlargement is noted. No significant pulmonary arterial enlargement is seen. Right chest wall port is noted. Mediastinum/Nodes: Thoracic inlet is within normal limits. There is a feeding catheter within the esophagus which extends into the proximal duodenum. No significant hilar or mediastinal adenopathy is identified at this time. Lungs/Pleura: The lungs are well aerated bilaterally. A small right-sided pleural effusion is noted with right lower lobe atelectasis. Scattered nodular changes are noted throughout both lungs. A small 5 mm nodule is noted in the left apex best seen on image number 30 of series 4. This was not well appreciated on the prior CT from June of 2018. A a small nodule seen in the right upper lobe measuring 3 mm on the prior exam has enlarged in size now measuring approximately 6 mm. The previously seen medial right upper lobe nodule is no longer appreciated and may have been postinflammatory in nature. No other significant nodules are seen. Musculoskeletal: No acute bony abnormality is noted. Degenerative changes of the thoracic spine are seen. CT ABDOMEN PELVIS FINDINGS Hepatobiliary: The liver and gallbladder appear within normal limits. Pancreas: Unremarkable. No pancreatic ductal dilatation or surrounding inflammatory changes. Spleen: Normal in size without focal abnormality. Adrenals/Urinary Tract: The adrenal glands  are unremarkable. The kidneys demonstrate no renal calculi or obstructive changes. The bladder is partially decompressed by Foley catheter. Stomach/Bowel: Air and fluid filled colon is noted. There are multiple abnormal small bowel loops noted in the mid and left abdomen with evidence of date pneumatosis. Some air is tracking into the small bowel does not area although no definitive portal venous gas is noted at this time. No definitive free air to suggest perforation is noted. Large anterior abdominal wound is noted. Rectal tube is noted. The appendix is not well visualized and may have been surgically removed. Vascular/Lymphatic: Aortic atherosclerosis. No enlarged abdominal or pelvic lymph nodes. Reproductive: Uterus is partially visualized and show some fluid within the endometrial canal. This is somewhat unusual for patient of this age. There is fullness in the region of the right and axilla. Which appeared to be present on the prior exam. Other: Minimal free fluid is noted. Some generalized edema is noted throughout the mesenteric structures likely related to the ischemic change in the small bowel. Musculoskeletal: Degenerative changes of lumbar spine without acute abnormality. IMPRESSION: There are multiple loops of small bowel showing significant pneumatosis most consistent with bowel ischemia. The lack of IV contrast precludes full evaluation of the VAC vasculature in this region. Air is noted tracking along the branches of the superior mesenteric vein although no definitive intraluminal portal venous air is seen at this time. No definitive changes of perforation are noted. Mild free fluid is noted as well as some edema throughout the mesenteric likely reactive in nature. No definitive abscess is seen. Fullness in the region of the right adnexae is noted consistent with some cystic changes seen on prior PET-CT. Some of the free fluid is also localized in this area although again no definitive abscess is  noted. Right lower lobe atelectasis and small right-sided pleural effusion. Increase in nodular densities in both lungs suspicious for progressive metastatic disease although a sizable nodule seen on prior PET-CT has resolved and may have simply been postinflammatory in nature. Continued follow-up of these nodules is recommended. These results will be called to the ordering clinician or representative by the Radiologist Assistant, and communication documented in the PACS or zVision Dashboard.  Electronically Signed: By: Inez Catalina M.D. On: 07/21/2017 19:57   Ct Head Wo Contrast  Result Date: 07/21/2017 CLINICAL DATA:  Altered mental status EXAM: CT HEAD WITHOUT CONTRAST TECHNIQUE: Contiguous axial images were obtained from the base of the skull through the vertex without intravenous contrast. COMPARISON:  07/04/2017, 07/01/2017 MRI, CT head 06/28/2017, 06/17/2017, PET-CT 02/02/2017 FINDINGS: Brain: No acute territorial infarction, new hemorrhage or intracranial mass is seen. Old encephalomalacia of the left cerebellar hemisphere. Old encephalomalacia of the right frontal lobe with coarse calcification. Moderate atrophy. Small vessel ischemic changes of the white matter. Old lacunar infarcts in the left white matter, basal ganglia and thalamus. Stable slightly enlarged ventricles likely due to atrophy. Vascular: No hyperdense vessels.  Carotid artery calcifications. Skull: Left sub occipital craniectomy. Old right frontal burr hole. No acute fracture. Incomplete fusion of anterior arch of C1. Sinuses/Orbits: Small osteoma in the left frontal ethmoidal air cells. Minimal mucosal thickening. No acute orbital abnormality. Other: Chronic anterior subluxation of the mandibular heads. IMPRESSION: 1. No definite CT evidence for acute intracranial abnormality. 2. Old encephalomalacia in the left cerebellum and right frontal lobe as previously described. Atrophy and small vessel ischemic changes of the white matter.  Electronically Signed   By: Donavan Foil M.D.   On: 07/21/2017 19:47   Ct Chest Wo Contrast  Addendum Date: 07/21/2017   ADDENDUM REPORT: 07/21/2017 20:51 ADDENDUM: Critical Value/emergent results were called by telephone at the time of interpretation on 07/21/2017 at 8:51 pm to Lovey Newcomer, NP, who verbally acknowledged these results and communicated to the surgical team involved. Electronically Signed   By: Inez Catalina M.D.   On: 07/21/2017 20:51   Result Date: 07/21/2017 CLINICAL DATA:  Abdominal pain and fevers EXAM: CT CHEST, ABDOMEN AND PELVIS WITHOUT CONTRAST TECHNIQUE: Multidetector CT imaging of the chest, abdomen and pelvis was performed following the standard protocol without IV contrast. COMPARISON:  05/23/2017, 02/02/2017 FINDINGS: CT CHEST FINDINGS Cardiovascular: Atherosclerotic calcifications are noted without aneurysmal dilatation. Coronary calcifications are seen. No significant cardiac enlargement is noted. No significant pulmonary arterial enlargement is seen. Right chest wall port is noted. Mediastinum/Nodes: Thoracic inlet is within normal limits. There is a feeding catheter within the esophagus which extends into the proximal duodenum. No significant hilar or mediastinal adenopathy is identified at this time. Lungs/Pleura: The lungs are well aerated bilaterally. A small right-sided pleural effusion is noted with right lower lobe atelectasis. Scattered nodular changes are noted throughout both lungs. A small 5 mm nodule is noted in the left apex best seen on image number 30 of series 4. This was not well appreciated on the prior CT from June of 2018. A a small nodule seen in the right upper lobe measuring 3 mm on the prior exam has enlarged in size now measuring approximately 6 mm. The previously seen medial right upper lobe nodule is no longer appreciated and may have been postinflammatory in nature. No other significant nodules are seen. Musculoskeletal: No acute bony abnormality is  noted. Degenerative changes of the thoracic spine are seen. CT ABDOMEN PELVIS FINDINGS Hepatobiliary: The liver and gallbladder appear within normal limits. Pancreas: Unremarkable. No pancreatic ductal dilatation or surrounding inflammatory changes. Spleen: Normal in size without focal abnormality. Adrenals/Urinary Tract: The adrenal glands are unremarkable. The kidneys demonstrate no renal calculi or obstructive changes. The bladder is partially decompressed by Foley catheter. Stomach/Bowel: Air and fluid filled colon is noted. There are multiple abnormal small bowel loops noted in the mid and left abdomen with  evidence of date pneumatosis. Some air is tracking into the small bowel does not area although no definitive portal venous gas is noted at this time. No definitive free air to suggest perforation is noted. Large anterior abdominal wound is noted. Rectal tube is noted. The appendix is not well visualized and may have been surgically removed. Vascular/Lymphatic: Aortic atherosclerosis. No enlarged abdominal or pelvic lymph nodes. Reproductive: Uterus is partially visualized and show some fluid within the endometrial canal. This is somewhat unusual for patient of this age. There is fullness in the region of the right and axilla. Which appeared to be present on the prior exam. Other: Minimal free fluid is noted. Some generalized edema is noted throughout the mesenteric structures likely related to the ischemic change in the small bowel. Musculoskeletal: Degenerative changes of lumbar spine without acute abnormality. IMPRESSION: There are multiple loops of small bowel showing significant pneumatosis most consistent with bowel ischemia. The lack of IV contrast precludes full evaluation of the VAC vasculature in this region. Air is noted tracking along the branches of the superior mesenteric vein although no definitive intraluminal portal venous air is seen at this time. No definitive changes of perforation are  noted. Mild free fluid is noted as well as some edema throughout the mesenteric likely reactive in nature. No definitive abscess is seen. Fullness in the region of the right adnexae is noted consistent with some cystic changes seen on prior PET-CT. Some of the free fluid is also localized in this area although again no definitive abscess is noted. Right lower lobe atelectasis and small right-sided pleural effusion. Increase in nodular densities in both lungs suspicious for progressive metastatic disease although a sizable nodule seen on prior PET-CT has resolved and may have simply been postinflammatory in nature. Continued follow-up of these nodules is recommended. These results will be called to the ordering clinician or representative by the Radiologist Assistant, and communication documented in the PACS or zVision Dashboard. Electronically Signed: By: Inez Catalina M.D. On: 07/21/2017 19:57   Dg Chest Port 1 View  Result Date: 07/21/2017 CLINICAL DATA:  Sepsis EXAM: PORTABLE CHEST 1 VIEW COMPARISON:  June 11, 2017 FINDINGS: Central catheter tip is at the cavoatrial junction. Nasogastric tube tip is in the distal stomach. No pneumothorax. There is bibasilar atelectasis. Ill-defined opacity left lower lobe may represent superimposed pneumonia. Heart is mildly enlarged with pulmonary vascularity within normal limits. No adenopathy. No bone lesions. IMPRESSION: Tube and catheter positions as described without pneumothorax. Bibasilar atelectasis. Suspect superimposed pneumonia left base. Stable cardiac prominence. Electronically Signed   By: Lowella Grip III M.D.   On: 07/21/2017 16:40   Dg Abd Portable 1v  Result Date: 07/22/2017 CLINICAL DATA:  Nasogastric tube placement.  Initial encounter. EXAM: PORTABLE ABDOMEN - 1 VIEW COMPARISON:  CT of the abdomen and pelvis performed 07/21/2018 FINDINGS: The patient's enteric tube is noted ending overlying the body of the stomach, with the side port about the  gastroesophageal junction. Diffuse pneumatosis is noted throughout the visualized upper abdomen, as seen on CT, raising concern for bowel ischemia. No acute osseous abnormalities are seen. The visualized lung bases are grossly clear. IMPRESSION: 1. Enteric tube noted ending overlying the body of the stomach, with the side port about the gastroesophageal junction. 2. Diffuse small bowel pneumatosis throughout the visualized upper abdomen, as seen on CT, raising concern for bowel ischemia. Electronically Signed   By: Garald Balding M.D.   On: 07/22/2017 00:11  Scheduled Meds: . carbamazepine  150 mg Oral TID  . folic acid  1 mg Per Tube Daily  . insulin aspart  0-15 Units Subcutaneous TID WC  . insulin aspart  0-5 Units Subcutaneous QHS  . mirtazapine  7.5 mg Oral QHS  . multivitamin  15 mL Per Tube Daily   Continuous Infusions: . sodium chloride 125 mL/hr at 07/22/17 1100  . lacosamide (VIMPAT) IV Stopped (07/22/17 1052)  . levETIRAcetam Stopped (07/22/17 1052)  . piperacillin-tazobactam (ZOSYN)  IV Stopped (07/22/17 0942)  . sodium chloride    . vancomycin Stopped (07/22/17 2897)     LOS: 29 days        Aline August, MD [facility] Pager [phone]  If 7PM-7AM, please contact night-coverage [email or web address] Password [email or web address] 07/22/2017, 11:14 AM

## 2017-07-22 NOTE — Progress Notes (Signed)
Notified critical care Dr.Byrum about elevated heart rate 150's b/p 87/65 EKG done. Ns1L bolus started per order. I will continue to monitor.

## 2017-07-22 NOTE — Progress Notes (Signed)
  Subjective:  This a well known patient to the neurology service. Had multiple seizures, prolonged encephalopathy. Improved and patient was communicative, alert and oriented x3. Last seen by neurology on 07/08/17.   Patient reconsulted on 8/18 after she was witnessed having a seizure. Witnessed by son who said she was clenched up and posturing then became unresponsive. Shortly after patient became febrile and hypotensive, noted to have bowel  ischemia.   Because of poor prognosis due to stage IV cancer, she was not surgical candidate for bowel resection. Currently NPO   ROS:  Unable to obtain due to poor mental status  Examination  Vitals:   07/22/17 1600 07/22/17 1700  BP: 105/60 103/67  Pulse: (!) 155 (!) 130  Resp: (!) 22 (!) 25  Temp:    SpO2: 100% 100%     General: Not in distress, cooperative CVS: pulse-normal rate and rhythm RS: breathing comfortably Extremities: normal   Neuro: MS: drowsy, not following commands, opens eyes to stimulation CN: pupils equal and reactive,  EOMI, face symmetric, tongue midline,, Motor: Moves all 4 extremities to pain Reflexes: 2+ bilaterally over patella, biceps, plantars: flexor   BMP Latest Ref Rng & Units 07/22/2017 07/21/2017 07/21/2017  Glucose 65 - 99 mg/dL 120(H) 156(H) 222(H)  BUN 6 - 20 mg/dL 47(H) 46(H) 38(H)  Creatinine 0.44 - 1.00 mg/dL 1.06(H) 1.26(H) 0.80  Sodium 135 - 145 mmol/L 146(H) 144 142  Potassium 3.5 - 5.1 mmol/L 3.3(L) 3.0(L) 3.4(L)  Chloride 101 - 111 mmol/L 115(H) 110 104  CO2 22 - 32 mmol/L 22 23 26   Calcium 8.9 - 10.3 mg/dL 7.4(L) 7.8(L) 8.4(L)     Imaging Reviewed:       ASSESSMENT AND PLAN  Metabolic and SepticEncephalopathy Seizures Metastatic Cancer stage IV Diabetes insipidus UTI and intrabdominal abscesses  ?Bilateral small SDH, ?Thalamic Stroke   This is a 66 year old female with a past medical history significant for metastatic adenocarcinoma of the appendix, CVA  with left sided deficits and known seizure disorder was brought in for GIB and subsequently found to have intra-abdominal abscesses. She was coded "stroke" on 11-Jul-2023 increased lethargy, garbled speech and a facial droop.Neurology was consulted for evaluation - favored to be a seizure as CT/MRI both without acute abnormality ( MRI did show restricted diffusion in left thalamus thought to be artifactual). EEG on 7/27 did not show epileptiform activity.   Patient was still lethargic, we placed VEEG on 07/04/17. The EEG did not show electrographic seizures however showed periodic lateralized discharges in the ictal interictal pattern. The load of the patient additional dose of Tegretol and resumed the Tegretol which was not being given due to the absence of a feeding tube. The EEG has shown improving encephalopathy and was disconnected.  LP was considered to r/o paraneoplastic however cancelled as patient improved clinically.    Seizures Currently does not appear to be having seizure like activity, AMS likely 2/2 sepsis Has seizure disorder and breakthrough in the setting of sepsis Continue Keppra 1g BID Switched tegretol to Vimpat as patient has been made NPO.  Overall poor prognosis

## 2017-07-22 NOTE — Progress Notes (Signed)
Chaplain extended emotional and spiritual support to three sisters of the patient (there are two or three brothers who were not present).  Please call as needed for ongoing support.  Luana Shu [phone]   07/22/17 1238  Clinical Encounter Type  Visited With Patient and family together  Visit Type Initial;Patient actively dying  Referral From Nurse  Consult/Referral To Chaplain  Spiritual Encounters  Spiritual Needs Prayer  Stress Factors  Patient Stress Factors Major life changes  Family Stress Factors Loss of control;Major life changes

## 2017-07-22 NOTE — Progress Notes (Signed)
[facility] Progress Note     Subjective: CC:  Was supposed to go to SNF yesterday but became encephalopathic and unresponsive and was moved to SDU. CT Abd performed shows pneumatosis intenstinalis.  Somnolent but briefly opens eyes to loud voice and arouses to sternal rub. This AM she answered my questions appropriately - told me her name, we are in [facility], and it is 2018. She denies any pain.    Objective: Vital signs in last 24 hours: Temp:  [97.6 F (36.4 C)-102.6 F (39.2 C)] 99.1 F (37.3 C) (08/19 0352) Pulse Rate:  [113-152] 129 (08/19 0500) Resp:  [10-34] 23 (08/19 0500) BP: (72-93)/(47-67) 90/62 (08/19 0500) SpO2:  [95 %-100 %] 98 % (08/19 0500) Weight:  [87.2 kg (192 lb 3.1 oz)] 87.2 kg (192 lb 3.1 oz) (08/19 0428) Last BM Date: 07/21/17  Intake/Output from previous day: 08/18 0701 - 08/19 0700 In: 120 [P.O.:120] Out: 2800 [Urine:650; Emesis/NG output:450; Stool:1700] Intake/Output this shift: No intake/output data recorded.  PE: Gen:  Lethargic  Pulm:  Normal effort Abd: Soft, non-tender, non-distended Skin: warm and dry, no rashes  Psych: oriented to person, place, time.  Lab Results:   Recent Labs  07/21/17 1800 07/22/17 0356  WBC 12.3* 11.5*  HGB 8.1* 7.0*  HCT 26.9* 23.9*  PLT 465* 388   BMET  Recent Labs  07/21/17 1800 07/22/17 0356  NA 144 146*  K 3.0* 3.3*  CL 110 115*  CO2 23 22  GLUCOSE 156* 120*  BUN 46* 47*  CREATININE 1.26* 1.06*  CALCIUM 7.8* 7.4*   PT/INR  Recent Labs  07/21/17 1800  LABPROT 19.8*  INR 1.66   CMP     Component Value Date/Time   NA 146 (H) 07/22/2017 0356   K 3.3 (L) 07/22/2017 0356   CL 115 (H) 07/22/2017 0356   CO2 22 07/22/2017 0356   GLUCOSE 120 (H) 07/22/2017 0356   BUN 47 (H) 07/22/2017 0356   CREATININE 1.06 (H) 07/22/2017 0356   CALCIUM 7.4 (L) 07/22/2017 0356   PROT 5.4 (L) 07/21/2017 1800   ALBUMIN 1.4 (L) 07/21/2017 1800   AST 28 07/21/2017 1800   ALT 12  (L) 07/21/2017 1800   ALKPHOS 150 (H) 07/21/2017 1800   BILITOT 0.5 07/21/2017 1800   GFRNONAA 53 (L) 07/22/2017 0356   GFRAA >60 07/22/2017 0356   Lipase     Component Value Date/Time   LIPASE 31 05/14/2017 2106       Studies/Results: Ct Abdomen Pelvis Wo Contrast  Addendum Date: 07/21/2017   ADDENDUM REPORT: 07/21/2017 20:51 ADDENDUM: Critical Value/emergent results were called by telephone at the time of interpretation on 07/21/2017 at 8:51 pm to Lovey Newcomer, NP, who verbally acknowledged these results and communicated to the surgical team involved. Electronically Signed   By: Inez Catalina M.D.   On: 07/21/2017 20:51   Result Date: 07/21/2017 CLINICAL DATA:  Abdominal pain and fevers EXAM: CT CHEST, ABDOMEN AND PELVIS WITHOUT CONTRAST TECHNIQUE: Multidetector CT imaging of the chest, abdomen and pelvis was performed following the standard protocol without IV contrast. COMPARISON:  05/23/2017, 02/02/2017 FINDINGS: CT CHEST FINDINGS Cardiovascular: Atherosclerotic calcifications are noted without aneurysmal dilatation. Coronary calcifications are seen. No significant cardiac enlargement is noted. No significant pulmonary arterial enlargement is seen. Right chest wall port is noted. Mediastinum/Nodes: Thoracic inlet is within normal limits. There is a feeding catheter within the esophagus which extends into the proximal duodenum. No significant hilar or mediastinal adenopathy is identified at this  time. Lungs/Pleura: The lungs are well aerated bilaterally. A small right-sided pleural effusion is noted with right lower lobe atelectasis. Scattered nodular changes are noted throughout both lungs. A small 5 mm nodule is noted in the left apex best seen on image number 30 of series 4. This was not well appreciated on the prior CT from June of 2018. A a small nodule seen in the right upper lobe measuring 3 mm on the prior exam has enlarged in size now measuring approximately 6 mm. The previously seen  medial right upper lobe nodule is no longer appreciated and may have been postinflammatory in nature. No other significant nodules are seen. Musculoskeletal: No acute bony abnormality is noted. Degenerative changes of the thoracic spine are seen. CT ABDOMEN PELVIS FINDINGS Hepatobiliary: The liver and gallbladder appear within normal limits. Pancreas: Unremarkable. No pancreatic ductal dilatation or surrounding inflammatory changes. Spleen: Normal in size without focal abnormality. Adrenals/Urinary Tract: The adrenal glands are unremarkable. The kidneys demonstrate no renal calculi or obstructive changes. The bladder is partially decompressed by Foley catheter. Stomach/Bowel: Air and fluid filled colon is noted. There are multiple abnormal small bowel loops noted in the mid and left abdomen with evidence of date pneumatosis. Some air is tracking into the small bowel does not area although no definitive portal venous gas is noted at this time. No definitive free air to suggest perforation is noted. Large anterior abdominal wound is noted. Rectal tube is noted. The appendix is not well visualized and may have been surgically removed. Vascular/Lymphatic: Aortic atherosclerosis. No enlarged abdominal or pelvic lymph nodes. Reproductive: Uterus is partially visualized and show some fluid within the endometrial canal. This is somewhat unusual for patient of this age. There is fullness in the region of the right and axilla. Which appeared to be present on the prior exam. Other: Minimal free fluid is noted. Some generalized edema is noted throughout the mesenteric structures likely related to the ischemic change in the small bowel. Musculoskeletal: Degenerative changes of lumbar spine without acute abnormality. IMPRESSION: There are multiple loops of small bowel showing significant pneumatosis most consistent with bowel ischemia. The lack of IV contrast precludes full evaluation of the VAC vasculature in this region. Air is  noted tracking along the branches of the superior mesenteric vein although no definitive intraluminal portal venous air is seen at this time. No definitive changes of perforation are noted. Mild free fluid is noted as well as some edema throughout the mesenteric likely reactive in nature. No definitive abscess is seen. Fullness in the region of the right adnexae is noted consistent with some cystic changes seen on prior PET-CT. Some of the free fluid is also localized in this area although again no definitive abscess is noted. Right lower lobe atelectasis and small right-sided pleural effusion. Increase in nodular densities in both lungs suspicious for progressive metastatic disease although a sizable nodule seen on prior PET-CT has resolved and may have simply been postinflammatory in nature. Continued follow-up of these nodules is recommended. These results will be called to the ordering clinician or representative by the Radiologist Assistant, and communication documented in the PACS or zVision Dashboard. Electronically Signed: By: Inez Catalina M.D. On: 07/21/2017 19:57   Ct Head Wo Contrast  Result Date: 07/21/2017 CLINICAL DATA:  Altered mental status EXAM: CT HEAD WITHOUT CONTRAST TECHNIQUE: Contiguous axial images were obtained from the base of the skull through the vertex without intravenous contrast. COMPARISON:  07/04/2017, 07/01/2017 MRI, CT head 06/28/2017, 06/17/2017, PET-CT 02/02/2017  FINDINGS: Brain: No acute territorial infarction, new hemorrhage or intracranial mass is seen. Old encephalomalacia of the left cerebellar hemisphere. Old encephalomalacia of the right frontal lobe with coarse calcification. Moderate atrophy. Small vessel ischemic changes of the white matter. Old lacunar infarcts in the left white matter, basal ganglia and thalamus. Stable slightly enlarged ventricles likely due to atrophy. Vascular: No hyperdense vessels.  Carotid artery calcifications. Skull: Left sub occipital  craniectomy. Old right frontal burr hole. No acute fracture. Incomplete fusion of anterior arch of C1. Sinuses/Orbits: Small osteoma in the left frontal ethmoidal air cells. Minimal mucosal thickening. No acute orbital abnormality. Other: Chronic anterior subluxation of the mandibular heads. IMPRESSION: 1. No definite CT evidence for acute intracranial abnormality. 2. Old encephalomalacia in the left cerebellum and right frontal lobe as previously described. Atrophy and small vessel ischemic changes of the white matter. Electronically Signed   By: Donavan Foil M.D.   On: 07/21/2017 19:47   Ct Chest Wo Contrast  Addendum Date: 07/21/2017   ADDENDUM REPORT: 07/21/2017 20:51 ADDENDUM: Critical Value/emergent results were called by telephone at the time of interpretation on 07/21/2017 at 8:51 pm to Lovey Newcomer, NP, who verbally acknowledged these results and communicated to the surgical team involved. Electronically Signed   By: Inez Catalina M.D.   On: 07/21/2017 20:51   Result Date: 07/21/2017 CLINICAL DATA:  Abdominal pain and fevers EXAM: CT CHEST, ABDOMEN AND PELVIS WITHOUT CONTRAST TECHNIQUE: Multidetector CT imaging of the chest, abdomen and pelvis was performed following the standard protocol without IV contrast. COMPARISON:  05/23/2017, 02/02/2017 FINDINGS: CT CHEST FINDINGS Cardiovascular: Atherosclerotic calcifications are noted without aneurysmal dilatation. Coronary calcifications are seen. No significant cardiac enlargement is noted. No significant pulmonary arterial enlargement is seen. Right chest wall port is noted. Mediastinum/Nodes: Thoracic inlet is within normal limits. There is a feeding catheter within the esophagus which extends into the proximal duodenum. No significant hilar or mediastinal adenopathy is identified at this time. Lungs/Pleura: The lungs are well aerated bilaterally. A small right-sided pleural effusion is noted with right lower lobe atelectasis. Scattered nodular changes  are noted throughout both lungs. A small 5 mm nodule is noted in the left apex best seen on image number 30 of series 4. This was not well appreciated on the prior CT from June of 2018. A a small nodule seen in the right upper lobe measuring 3 mm on the prior exam has enlarged in size now measuring approximately 6 mm. The previously seen medial right upper lobe nodule is no longer appreciated and may have been postinflammatory in nature. No other significant nodules are seen. Musculoskeletal: No acute bony abnormality is noted. Degenerative changes of the thoracic spine are seen. CT ABDOMEN PELVIS FINDINGS Hepatobiliary: The liver and gallbladder appear within normal limits. Pancreas: Unremarkable. No pancreatic ductal dilatation or surrounding inflammatory changes. Spleen: Normal in size without focal abnormality. Adrenals/Urinary Tract: The adrenal glands are unremarkable. The kidneys demonstrate no renal calculi or obstructive changes. The bladder is partially decompressed by Foley catheter. Stomach/Bowel: Air and fluid filled colon is noted. There are multiple abnormal small bowel loops noted in the mid and left abdomen with evidence of date pneumatosis. Some air is tracking into the small bowel does not area although no definitive portal venous gas is noted at this time. No definitive free air to suggest perforation is noted. Large anterior abdominal wound is noted. Rectal tube is noted. The appendix is not well visualized and may have been surgically removed. Vascular/Lymphatic: Aortic atherosclerosis.  No enlarged abdominal or pelvic lymph nodes. Reproductive: Uterus is partially visualized and show some fluid within the endometrial canal. This is somewhat unusual for patient of this age. There is fullness in the region of the right and axilla. Which appeared to be present on the prior exam. Other: Minimal free fluid is noted. Some generalized edema is noted throughout the mesenteric structures likely related  to the ischemic change in the small bowel. Musculoskeletal: Degenerative changes of lumbar spine without acute abnormality. IMPRESSION: There are multiple loops of small bowel showing significant pneumatosis most consistent with bowel ischemia. The lack of IV contrast precludes full evaluation of the VAC vasculature in this region. Air is noted tracking along the branches of the superior mesenteric vein although no definitive intraluminal portal venous air is seen at this time. No definitive changes of perforation are noted. Mild free fluid is noted as well as some edema throughout the mesenteric likely reactive in nature. No definitive abscess is seen. Fullness in the region of the right adnexae is noted consistent with some cystic changes seen on prior PET-CT. Some of the free fluid is also localized in this area although again no definitive abscess is noted. Right lower lobe atelectasis and small right-sided pleural effusion. Increase in nodular densities in both lungs suspicious for progressive metastatic disease although a sizable nodule seen on prior PET-CT has resolved and may have simply been postinflammatory in nature. Continued follow-up of these nodules is recommended. These results will be called to the ordering clinician or representative by the Radiologist Assistant, and communication documented in the PACS or zVision Dashboard. Electronically Signed: By: Inez Catalina M.D. On: 07/21/2017 19:57   Dg Chest Port 1 View  Result Date: 07/21/2017 CLINICAL DATA:  Sepsis EXAM: PORTABLE CHEST 1 VIEW COMPARISON:  June 11, 2017 FINDINGS: Central catheter tip is at the cavoatrial junction. Nasogastric tube tip is in the distal stomach. No pneumothorax. There is bibasilar atelectasis. Ill-defined opacity left lower lobe may represent superimposed pneumonia. Heart is mildly enlarged with pulmonary vascularity within normal limits. No adenopathy. No bone lesions. IMPRESSION: Tube and catheter positions as  described without pneumothorax. Bibasilar atelectasis. Suspect superimposed pneumonia left base. Stable cardiac prominence. Electronically Signed   By: Lowella Grip III M.D.   On: 07/21/2017 16:40   Dg Abd Portable 1v  Result Date: 07/22/2017 CLINICAL DATA:  Nasogastric tube placement.  Initial encounter. EXAM: PORTABLE ABDOMEN - 1 VIEW COMPARISON:  CT of the abdomen and pelvis performed 07/21/2018 FINDINGS: The patient's enteric tube is noted ending overlying the body of the stomach, with the side port about the gastroesophageal junction. Diffuse pneumatosis is noted throughout the visualized upper abdomen, as seen on CT, raising concern for bowel ischemia. No acute osseous abnormalities are seen. The visualized lung bases are grossly clear. IMPRESSION: 1. Enteric tube noted ending overlying the body of the stomach, with the side port about the gastroesophageal junction. 2. Diffuse small bowel pneumatosis throughout the visualized upper abdomen, as seen on CT, raising concern for bowel ischemia. Electronically Signed   By: Garald Balding M.D.   On: 07/22/2017 00:11    Anti-infectives: Anti-infectives    Start     Dose/Rate Route Frequency Ordered Stop   07/22/17 0500  vancomycin (VANCOCIN) IVPB 1000 mg/200 mL premix     1,000 mg 200 mL/hr over 60 Minutes Intravenous Every 12 hours 07/21/17 1545     07/21/17 2200  piperacillin-tazobactam (ZOSYN) IVPB 3.375 g     3.375 g 12.5 mL/hr  over 240 Minutes Intravenous Every 8 hours 07/21/17 1545     07/21/17 1630  vancomycin (VANCOCIN) 1,500 mg in sodium chloride 0.9 % 500 mL IVPB     1,500 mg 250 mL/hr over 120 Minutes Intravenous  Once 07/21/17 1545 07/22/17 0000   07/21/17 1500  piperacillin-tazobactam (ZOSYN) IVPB 3.375 g     3.375 g 100 mL/hr over 30 Minutes Intravenous  Once 07/21/17 1449 07/21/17 1638   07/04/17 1100  Ampicillin-Sulbactam (UNASYN) 3 g in sodium chloride 0.9 % 100 mL IVPB     3 g 200 mL/hr over 30 Minutes Intravenous Every  6 hours 07/04/17 0823 07/10/17 0429   07/03/17 1600  Ampicillin-Sulbactam (UNASYN) 3 g in sodium chloride 0.9 % 100 mL IVPB  Status:  Discontinued     3 g 200 mL/hr over 30 Minutes Intravenous Every 6 hours 07/03/17 1047 07/04/17 0823   06/29/17 1400  ceFEPIme (MAXIPIME) 2 g in dextrose 5 % 50 mL IVPB  Status:  Discontinued     2 g 100 mL/hr over 30 Minutes Intravenous Every 8 hours 06/29/17 0853 06/29/17 0857   06/29/17 1000  ceFEPIme (MAXIPIME) 2 g in dextrose 5 % 50 mL IVPB  Status:  Discontinued     2 g 100 mL/hr over 30 Minutes Intravenous Every 8 hours 06/29/17 0857 07/03/17 1046   06/29/17 0930  metroNIDAZOLE (FLAGYL) IVPB 500 mg  Status:  Discontinued     500 mg 100 mL/hr over 60 Minutes Intravenous Every 8 hours 06/29/17 0838 06/30/17 0956   06/24/17 0600  piperacillin-tazobactam (ZOSYN) IVPB 3.375 g  Status:  Discontinued     3.375 g 12.5 mL/hr over 240 Minutes Intravenous Every 8 hours 06/23/17 2254 06/29/17 0838   06/23/17 2300  piperacillin-tazobactam (ZOSYN) IVPB 3.375 g     3.375 g 100 mL/hr over 30 Minutes Intravenous  Once 06/23/17 2254 06/24/17 0056     Assessment/Plan Diabetes insipidus Seizure disorder - Tegretol, Keppra Lower GI bleed - resolved Anemia - Hg 8.3,stable/improving DM HTN Enterococcus UTI - completed unasyn Acute metabolic encephalopathy/Left Thalamic Stroke/ Bilateral small SDH Malnutrition - started on dysphagia diet 8/7, but due to poor PO intake TF is being continued  Primary appendiceal carcinoma with metastatic disease, stage IV  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 [name]: 1. 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  2. Exploratory laparotomy, drainage of abdominal abscess, repair of small bowel perforation, repair of small bowel enterotomy, enterocolostomy (mid ileum to mid transverse colon). - local wound care  -  having watery stools, rectal tube in place  *Now with pneumatosis intestinalis on CT abdomen  - Dr. Kae Heller spoke with family yesterday, please see previous note. Tube feeds discontinued, NPO, and NGT placed. Meds per tube ok. Supportive care with, abx, IVF. Do not recommend acute surgical intervention, as it will unlikely change patient outcome and lead to increased suffering. Re-consult palliative care.    FEN- DYS III, TF VTE- SCDs, Not on chemical VTE 2/2 anemia ID- Zosyn 7/21>>7/27, Flagyl 7/27>>7/28, Cefipime 7/27>>7/27, Unasyn 7/31>>8/7  Plan:IVF, IV abx; continue TIDwet to dry dressing changes; palliative consult.   LOS: 67 days    Lori [name] , [facility] 07/22/2017, 7:36 AM Pager: [phone] Consults: [phone] Mon-Fri 7:00 am-4:30 pm Sat-Sun 7:00 am-11:30 am

## 2017-07-22 NOTE — Progress Notes (Signed)
Cortrak removed and end intact no complication and new NGT placed and xray done to confirm placement and Dr.Conner confirmed placement and may be used. I will connect to [facility] intermittent suction per Dr.Conners request. NGT may also be used for meds. I will continue to monitor.

## 2017-07-23 ENCOUNTER — Ambulatory Visit (HOSPITAL_COMMUNITY): Payer: Medicare Other

## 2017-07-23 ENCOUNTER — Inpatient Hospital Stay (HOSPITAL_COMMUNITY): Payer: Medicare Other

## 2017-07-23 DIAGNOSIS — I959 Hypotension, unspecified: Secondary | ICD-10-CM

## 2017-07-23 LAB — URINE CULTURE

## 2017-07-23 LAB — MAGNESIUM: Magnesium: 1.8 mg/dL (ref 1.7–2.4)

## 2017-07-23 LAB — BLOOD GAS, ARTERIAL
Acid-Base Excess: 0.3 mmol/L (ref 0.0–2.0)
BICARBONATE: 24 mmol/L (ref 20.0–28.0)
Drawn by: 441351
O2 CONTENT: 2 L/min
O2 SAT: 98.2 %
PCO2 ART: 35.8 mmHg (ref 32.0–48.0)
PO2 ART: 95.1 mmHg (ref 83.0–108.0)
Patient temperature: 98.6
pH, Arterial: 7.442 (ref 7.350–7.450)

## 2017-07-23 LAB — COMPREHENSIVE METABOLIC PANEL
ALK PHOS: 119 U/L (ref 38–126)
ALT: 13 U/L — AB (ref 14–54)
ANION GAP: 6 (ref 5–15)
AST: 19 U/L (ref 15–41)
Albumin: 1.2 g/dL — ABNORMAL LOW (ref 3.5–5.0)
BILIRUBIN TOTAL: 0.5 mg/dL (ref 0.3–1.2)
BUN: 33 mg/dL — ABNORMAL HIGH (ref 6–20)
CALCIUM: 7.5 mg/dL — AB (ref 8.9–10.3)
CO2: 22 mmol/L (ref 22–32)
CREATININE: 0.76 mg/dL (ref 0.44–1.00)
Chloride: 121 mmol/L — ABNORMAL HIGH (ref 101–111)
GFR calc non Af Amer: >60 mL/min (ref >60)
Glucose, Bld: 81 mg/dL (ref 65–99)
Potassium: 3.7 mmol/L (ref 3.5–5.1)
SODIUM: 149 mmol/L — AB (ref 135–145)
TOTAL PROTEIN: 5 g/dL — AB (ref 6.5–8.1)

## 2017-07-23 LAB — CBC WITH DIFFERENTIAL/PLATELET
BASOS ABS: 0 10*3/uL (ref 0.0–0.1)
BASOS PCT: 0 %
EOS PCT: 1 %
Eosinophils Absolute: 0.2 10*3/uL (ref 0.0–0.7)
HEMATOCRIT: 22.2 % — AB (ref 36.0–46.0)
HEMOGLOBIN: 6.6 g/dL — AB (ref 12.0–15.0)
LYMPHS ABS: 1.1 10*3/uL (ref 0.7–4.0)
LYMPHS PCT: 7 %
MCH: 26.4 pg (ref 26.0–34.0)
MCHC: 29.7 g/dL — AB (ref 30.0–36.0)
MCV: 88.8 fL (ref 78.0–100.0)
MONOS PCT: 4 %
Monocytes Absolute: 0.6 10*3/uL (ref 0.1–1.0)
NEUTROS ABS: 13.1 10*3/uL — AB (ref 1.7–7.7)
Neutrophils Relative %: 88 %
Platelets: 388 10*3/uL (ref 150–400)
RBC: 2.5 MIL/uL — ABNORMAL LOW (ref 3.87–5.11)
RDW: 17.6 % — ABNORMAL HIGH (ref 11.5–15.5)
WBC: 15 10*3/uL — ABNORMAL HIGH (ref 4.0–10.5)

## 2017-07-23 LAB — GLUCOSE, CAPILLARY
GLUCOSE-CAPILLARY: 74 mg/dL (ref 65–99)
GLUCOSE-CAPILLARY: 88 mg/dL (ref 65–99)
GLUCOSE-CAPILLARY: 88 mg/dL (ref 65–99)
Glucose-Capillary: 74 mg/dL (ref 65–99)
Glucose-Capillary: 67 mg/dL (ref 65–99)
Glucose-Capillary: 66 mg/dL (ref 65–99)
Glucose-Capillary: 68 mg/dL (ref 65–99)

## 2017-07-23 LAB — LACTIC ACID, PLASMA: Lactic Acid, Venous: 2 mmol/L (ref 0.5–1.9)

## 2017-07-23 LAB — PREPARE RBC (CROSSMATCH)

## 2017-07-23 MED ORDER — KCL IN DEXTROSE-NACL 40-5-0.45 MEQ/L-%-% IV SOLN
INTRAVENOUS | Status: DC
  Administered 2017-07-23 (×2): via INTRAVENOUS
  Filled 2017-07-23 (×4): qty 1000

## 2017-07-23 MED ORDER — SODIUM CHLORIDE 0.9 % IV SOLN: Freq: Once | INTRAVENOUS | Status: DC

## 2017-07-23 MED ORDER — MENTHOL 3 MG MT LOZG
1.0000 | LOZENGE | OROMUCOSAL | Status: DC | PRN
Start: 2017-07-23 — End: 2017-08-10
  Administered 2017-07-24: 3 mg via ORAL
  Filled 2017-07-23 (×2): qty 9

## 2017-07-23 NOTE — Progress Notes (Signed)
Patient ID: Lori [name], female   DOB: [birth year], 66 y.o.   MRN: [id number]  This NP visited patient at the bedside as a follow up to previous [place], and  for palliative medicine needs and emotional support from her family.  Both sons are present at the bedside.   Discussed with family most recent medical complication of  multiple loops of small bowel showing significant pneumatosis most consistent with bowel ischemia, Right lower lobe atelectasis and small right-sided pleural effusion, Increase in nodular densities in both lungs suspicious for progressive metastatic disease, and ongoing progression of metastatic disease and inability to support her nutritionally or from a hydration standpoint.  Both sons offer little response to these presented concerns.  Family remains hopeful for continued improvement.  They are open to all offered and available medical interventions to prolong life.  I discussed with Dr. Starla Link the recommendation for a team meeting to include medical specialists, nursing and family. I will work on coordinating this.  This patient is high risk for decompensation.    Questions and concerns addressed.    Palliative medicine team will continue to support holistically.  Time in  1300 AM      Time out  1335 AM      Total time spent on the unit was 35 minutes    Discussed with Dr Starla Link  Greater than 50% of the time was spent in counseling and coordination of care  Wadie Lessen NP  Palliative Medicine Team Team Phone # [phone] Pager [phone]

## 2017-07-23 NOTE — Progress Notes (Signed)
Subjective/Chief Complaint: Opens eyes this am   Objective: Vital signs in last 24 hours: Temp:  [98 F (36.7 C)-99.3 F (37.4 C)] 98 F (36.7 C) (08/19 2100) Pulse Rate:  [115-155] 115 (08/19 2200) Resp:  [20-26] 20 (08/19 2200) BP: (87-125)/(57-81) 120/81 (08/19 2200) SpO2:  [100 %] 100 % (08/19 2200) Last BM Date: 07/22/17  Intake/Output from previous day: 08/19 0701 - 08/20 0700 In: 1282.5 [I.V.:537.5; IV Piggyback:745] Out: 144 [Urine:675; Emesis/NG output:200] Intake/Output this shift: No intake/output data recorded.  General somnolent, does open eyes to stimulation this am Lungs clear abd wound open with exudate down left side and visible sutures, not much granulation tissue, no infection cv tachy rr   Lab Results:   Recent Labs  07/21/17 1800 07/22/17 0356  WBC 12.3* 11.5*  HGB 8.1* 7.0*  HCT 26.9* 23.9*  PLT 465* 388   BMET  Recent Labs  07/22/17 0356 07/23/17 0625  NA 146* 149*  K 3.3* 3.7  CL 115* 121*  CO2 22 22  GLUCOSE 120* 81  BUN 47* 33*  CREATININE 1.06* 0.76  CALCIUM 7.4* 7.5*   PT/INR  Recent Labs  07/21/17 1800  LABPROT 19.8*  INR 1.66   ABG  Recent Labs  07/21/17 1500 07/23/17 0523  PHART 7.406 7.442  HCO3 23.5 24.0    Studies/Results: Ct Abdomen Pelvis Wo Contrast  Addendum Date: 07/21/2017   ADDENDUM REPORT: 07/21/2017 20:51 ADDENDUM: Critical Value/emergent results were called by telephone at the time of interpretation on 07/21/2017 at 8:51 pm to Lovey Newcomer, NP, who verbally acknowledged these results and communicated to the surgical team involved. Electronically Signed   By: Inez Catalina M.D.   On: 07/21/2017 20:51   Result Date: 07/21/2017 CLINICAL DATA:  Abdominal pain and fevers EXAM: CT CHEST, ABDOMEN AND PELVIS WITHOUT CONTRAST TECHNIQUE: Multidetector CT imaging of the chest, abdomen and pelvis was performed following the standard protocol without IV contrast. COMPARISON:  05/23/2017, 02/02/2017  FINDINGS: CT CHEST FINDINGS Cardiovascular: Atherosclerotic calcifications are noted without aneurysmal dilatation. Coronary calcifications are seen. No significant cardiac enlargement is noted. No significant pulmonary arterial enlargement is seen. Right chest wall port is noted. Mediastinum/Nodes: Thoracic inlet is within normal limits. There is a feeding catheter within the esophagus which extends into the proximal duodenum. No significant hilar or mediastinal adenopathy is identified at this time. Lungs/Pleura: The lungs are well aerated bilaterally. A small right-sided pleural effusion is noted with right lower lobe atelectasis. Scattered nodular changes are noted throughout both lungs. A small 5 mm nodule is noted in the left apex best seen on image number 30 of series 4. This was not well appreciated on the prior CT from June of 2018. A a small nodule seen in the right upper lobe measuring 3 mm on the prior exam has enlarged in size now measuring approximately 6 mm. The previously seen medial right upper lobe nodule is no longer appreciated and may have been postinflammatory in nature. No other significant nodules are seen. Musculoskeletal: No acute bony abnormality is noted. Degenerative changes of the thoracic spine are seen. CT ABDOMEN PELVIS FINDINGS Hepatobiliary: The liver and gallbladder appear within normal limits. Pancreas: Unremarkable. No pancreatic ductal dilatation or surrounding inflammatory changes. Spleen: Normal in size without focal abnormality. Adrenals/Urinary Tract: The adrenal glands are unremarkable. The kidneys demonstrate no renal calculi or obstructive changes. The bladder is partially decompressed by Foley catheter. Stomach/Bowel: Air and fluid filled colon is noted. There are multiple abnormal small bowel loops noted  in the mid and left abdomen with evidence of date pneumatosis. Some air is tracking into the small bowel does not area although no definitive portal venous gas is  noted at this time. No definitive free air to suggest perforation is noted. Large anterior abdominal wound is noted. Rectal tube is noted. The appendix is not well visualized and may have been surgically removed. Vascular/Lymphatic: Aortic atherosclerosis. No enlarged abdominal or pelvic lymph nodes. Reproductive: Uterus is partially visualized and show some fluid within the endometrial canal. This is somewhat unusual for patient of this age. There is fullness in the region of the right and axilla. Which appeared to be present on the prior exam. Other: Minimal free fluid is noted. Some generalized edema is noted throughout the mesenteric structures likely related to the ischemic change in the small bowel. Musculoskeletal: Degenerative changes of lumbar spine without acute abnormality. IMPRESSION: There are multiple loops of small bowel showing significant pneumatosis most consistent with bowel ischemia. The lack of IV contrast precludes full evaluation of the VAC vasculature in this region. Air is noted tracking along the branches of the superior mesenteric vein although no definitive intraluminal portal venous air is seen at this time. No definitive changes of perforation are noted. Mild free fluid is noted as well as some edema throughout the mesenteric likely reactive in nature. No definitive abscess is seen. Fullness in the region of the right adnexae is noted consistent with some cystic changes seen on prior PET-CT. Some of the free fluid is also localized in this area although again no definitive abscess is noted. Right lower lobe atelectasis and small right-sided pleural effusion. Increase in nodular densities in both lungs suspicious for progressive metastatic disease although a sizable nodule seen on prior PET-CT has resolved and may have simply been postinflammatory in nature. Continued follow-up of these nodules is recommended. These results will be called to the ordering clinician or representative by  the Radiologist Assistant, and communication documented in the PACS or zVision Dashboard. Electronically Signed: By: Inez Catalina M.D. On: 07/21/2017 19:57   Ct Head Wo Contrast  Result Date: 07/21/2017 CLINICAL DATA:  Altered mental status EXAM: CT HEAD WITHOUT CONTRAST TECHNIQUE: Contiguous axial images were obtained from the base of the skull through the vertex without intravenous contrast. COMPARISON:  07/04/2017, 07/01/2017 MRI, CT head 06/28/2017, 06/17/2017, PET-CT 02/02/2017 FINDINGS: Brain: No acute territorial infarction, new hemorrhage or intracranial mass is seen. Old encephalomalacia of the left cerebellar hemisphere. Old encephalomalacia of the right frontal lobe with coarse calcification. Moderate atrophy. Small vessel ischemic changes of the white matter. Old lacunar infarcts in the left white matter, basal ganglia and thalamus. Stable slightly enlarged ventricles likely due to atrophy. Vascular: No hyperdense vessels.  Carotid artery calcifications. Skull: Left sub occipital craniectomy. Old right frontal burr hole. No acute fracture. Incomplete fusion of anterior arch of C1. Sinuses/Orbits: Small osteoma in the left frontal ethmoidal air cells. Minimal mucosal thickening. No acute orbital abnormality. Other: Chronic anterior subluxation of the mandibular heads. IMPRESSION: 1. No definite CT evidence for acute intracranial abnormality. 2. Old encephalomalacia in the left cerebellum and right frontal lobe as previously described. Atrophy and small vessel ischemic changes of the white matter. Electronically Signed   By: Donavan Foil M.D.   On: 07/21/2017 19:47   Ct Chest Wo Contrast  Addendum Date: 07/21/2017   ADDENDUM REPORT: 07/21/2017 20:51 ADDENDUM: Critical Value/emergent results were called by telephone at the time of interpretation on 07/21/2017 at 8:51 pm to [facility]  Blount, NP, who verbally acknowledged these results and communicated to the surgical team involved. Electronically Signed    By: Inez Catalina M.D.   On: 07/21/2017 20:51   Result Date: 07/21/2017 CLINICAL DATA:  Abdominal pain and fevers EXAM: CT CHEST, ABDOMEN AND PELVIS WITHOUT CONTRAST TECHNIQUE: Multidetector CT imaging of the chest, abdomen and pelvis was performed following the standard protocol without IV contrast. COMPARISON:  05/23/2017, 02/02/2017 FINDINGS: CT CHEST FINDINGS Cardiovascular: Atherosclerotic calcifications are noted without aneurysmal dilatation. Coronary calcifications are seen. No significant cardiac enlargement is noted. No significant pulmonary arterial enlargement is seen. Right chest wall port is noted. Mediastinum/Nodes: Thoracic inlet is within normal limits. There is a feeding catheter within the esophagus which extends into the proximal duodenum. No significant hilar or mediastinal adenopathy is identified at this time. Lungs/Pleura: The lungs are well aerated bilaterally. A small right-sided pleural effusion is noted with right lower lobe atelectasis. Scattered nodular changes are noted throughout both lungs. A small 5 mm nodule is noted in the left apex best seen on image number 30 of series 4. This was not well appreciated on the prior CT from June of 2018. A a small nodule seen in the right upper lobe measuring 3 mm on the prior exam has enlarged in size now measuring approximately 6 mm. The previously seen medial right upper lobe nodule is no longer appreciated and may have been postinflammatory in nature. No other significant nodules are seen. Musculoskeletal: No acute bony abnormality is noted. Degenerative changes of the thoracic spine are seen. CT ABDOMEN PELVIS FINDINGS Hepatobiliary: The liver and gallbladder appear within normal limits. Pancreas: Unremarkable. No pancreatic ductal dilatation or surrounding inflammatory changes. Spleen: Normal in size without focal abnormality. Adrenals/Urinary Tract: The adrenal glands are unremarkable. The kidneys demonstrate no renal calculi or  obstructive changes. The bladder is partially decompressed by Foley catheter. Stomach/Bowel: Air and fluid filled colon is noted. There are multiple abnormal small bowel loops noted in the mid and left abdomen with evidence of date pneumatosis. Some air is tracking into the small bowel does not area although no definitive portal venous gas is noted at this time. No definitive free air to suggest perforation is noted. Large anterior abdominal wound is noted. Rectal tube is noted. The appendix is not well visualized and may have been surgically removed. Vascular/Lymphatic: Aortic atherosclerosis. No enlarged abdominal or pelvic lymph nodes. Reproductive: Uterus is partially visualized and show some fluid within the endometrial canal. This is somewhat unusual for patient of this age. There is fullness in the region of the right and axilla. Which appeared to be present on the prior exam. Other: Minimal free fluid is noted. Some generalized edema is noted throughout the mesenteric structures likely related to the ischemic change in the small bowel. Musculoskeletal: Degenerative changes of lumbar spine without acute abnormality. IMPRESSION: There are multiple loops of small bowel showing significant pneumatosis most consistent with bowel ischemia. The lack of IV contrast precludes full evaluation of the VAC vasculature in this region. Air is noted tracking along the branches of the superior mesenteric vein although no definitive intraluminal portal venous air is seen at this time. No definitive changes of perforation are noted. Mild free fluid is noted as well as some edema throughout the mesenteric likely reactive in nature. No definitive abscess is seen. Fullness in the region of the right adnexae is noted consistent with some cystic changes seen on prior PET-CT. Some of the free fluid is also localized in this area  although again no definitive abscess is noted. Right lower lobe atelectasis and small right-sided pleural  effusion. Increase in nodular densities in both lungs suspicious for progressive metastatic disease although a sizable nodule seen on prior PET-CT has resolved and may have simply been postinflammatory in nature. Continued follow-up of these nodules is recommended. These results will be called to the ordering clinician or representative by the Radiologist Assistant, and communication documented in the PACS or zVision Dashboard. Electronically Signed: By: Inez Catalina M.D. On: 07/21/2017 19:57   Dg Chest Port 1 View  Result Date: 07/21/2017 CLINICAL DATA:  Sepsis EXAM: PORTABLE CHEST 1 VIEW COMPARISON:  June 11, 2017 FINDINGS: Central catheter tip is at the cavoatrial junction. Nasogastric tube tip is in the distal stomach. No pneumothorax. There is bibasilar atelectasis. Ill-defined opacity left lower lobe may represent superimposed pneumonia. Heart is mildly enlarged with pulmonary vascularity within normal limits. No adenopathy. No bone lesions. IMPRESSION: Tube and catheter positions as described without pneumothorax. Bibasilar atelectasis. Suspect superimposed pneumonia left base. Stable cardiac prominence. Electronically Signed   By: Lowella Grip III M.D.   On: 07/21/2017 16:40   Dg Abd Portable 1v  Result Date: 07/22/2017 CLINICAL DATA:  Nasogastric tube placement.  Initial encounter. EXAM: PORTABLE ABDOMEN - 1 VIEW COMPARISON:  CT of the abdomen and pelvis performed 07/21/2018 FINDINGS: The patient's enteric tube is noted ending overlying the body of the stomach, with the side port about the gastroesophageal junction. Diffuse pneumatosis is noted throughout the visualized upper abdomen, as seen on CT, raising concern for bowel ischemia. No acute osseous abnormalities are seen. The visualized lung bases are grossly clear. IMPRESSION: 1. Enteric tube noted ending overlying the body of the stomach, with the side port about the gastroesophageal junction. 2. Diffuse small bowel pneumatosis throughout  the visualized upper abdomen, as seen on CT, raising concern for bowel ischemia. Electronically Signed   By: Garald Balding M.D.   On: 07/22/2017 00:11    Anti-infectives: Anti-infectives    Start     Dose/Rate Route Frequency Ordered Stop   07/22/17 0500  vancomycin (VANCOCIN) IVPB 1000 mg/200 mL premix     1,000 mg 200 mL/hr over 60 Minutes Intravenous Every 12 hours 07/21/17 1545     07/21/17 2200  piperacillin-tazobactam (ZOSYN) IVPB 3.375 g     3.375 g 12.5 mL/hr over 240 Minutes Intravenous Every 8 hours 07/21/17 1545     07/21/17 1630  vancomycin (VANCOCIN) 1,500 mg in sodium chloride 0.9 % 500 mL IVPB     1,500 mg 250 mL/hr over 120 Minutes Intravenous  Once 07/21/17 1545 07/22/17 0000   07/21/17 1500  piperacillin-tazobactam (ZOSYN) IVPB 3.375 g     3.375 g 100 mL/hr over 30 Minutes Intravenous  Once 07/21/17 1449 07/21/17 1638   07/04/17 1100  Ampicillin-Sulbactam (UNASYN) 3 g in sodium chloride 0.9 % 100 mL IVPB     3 g 200 mL/hr over 30 Minutes Intravenous Every 6 hours 07/04/17 0823 07/10/17 0429   07/03/17 1600  Ampicillin-Sulbactam (UNASYN) 3 g in sodium chloride 0.9 % 100 mL IVPB  Status:  Discontinued     3 g 200 mL/hr over 30 Minutes Intravenous Every 6 hours 07/03/17 1047 07/04/17 0823   06/29/17 1400  ceFEPIme (MAXIPIME) 2 g in dextrose 5 % 50 mL IVPB  Status:  Discontinued     2 g 100 mL/hr over 30 Minutes Intravenous Every 8 hours 06/29/17 0853 06/29/17 0857   06/29/17 1000  ceFEPIme (MAXIPIME) 2  g in dextrose 5 % 50 mL IVPB  Status:  Discontinued     2 g 100 mL/hr over 30 Minutes Intravenous Every 8 hours 06/29/17 0857 07/03/17 1046   06/29/17 0930  metroNIDAZOLE (FLAGYL) IVPB 500 mg  Status:  Discontinued     500 mg 100 mL/hr over 60 Minutes Intravenous Every 8 hours 06/29/17 0838 06/30/17 0956   06/24/17 0600  piperacillin-tazobactam (ZOSYN) IVPB 3.375 g  Status:  Discontinued     3.375 g 12.5 mL/hr over 240 Minutes Intravenous Every 8 hours 06/23/17 2254  06/29/17 0838   06/23/17 2300  piperacillin-tazobactam (ZOSYN) IVPB 3.375 g     3.375 g 100 mL/hr over 30 Minutes Intravenous  Once 06/23/17 2254 06/24/17 0056      Assessment/Plan: Primary appendiceal carcinoma with metastatic disease, stage IV  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 [name]: 1. 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  2. Exploratory laparotomy, drainage of abdominal abscess, repair of small bowel perforation, repair of small bowel enterotomy, enterocolostomy (mid ileum to mid transverse colon). - local wound care, can continue some debridement but I dont this this will likely ever heal at this pont Likely small bowel ischemia, pneumatosis on ct  -I agree with prior notes and in discussion with Dr Kae Heller that no surgery can resolve this issue.  Her cancer is not really able to be treated and I think surgery would not really be possible or change outcome - can continue ng tube and abx for now - recommend palliative care consult and discussion of goals of care   [facility] 07/23/2017

## 2017-07-23 NOTE — Clinical Social Work Note (Signed)
CSW met with patient. Son and three female relatives at bedside. CSW notified son of [facility] bed offer as they are the only SNF that will take patient with an NG tube. He would like to research this facility but is familiar with it's location. CSW provided contact card for him to call CSW once he has reviewed it. He did note that someone last week mentioned [facility] but he and the family members at bedside said they did not want [facility].  Dayton Scrape, [place]

## 2017-07-23 NOTE — Progress Notes (Signed)
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 She was d/c to SNF with wound VAC. She returned 7/21 with lower GI bleeding. Course has been c/b likely seizure and worsening encephalopathy.   Of note, pt is not a candidate for any further treatment for her cancer.  Palliative medicine has been following and, despite multiple discussions, family continues to request full code and all aggressive interventions. At one point family requested that MSO4 be stopped as they were concerned that it was contributing to her AMS.  However, there was no improvement without it and they felt she was uncomfortable so it was resumed.     She had persistent encephalopathy even after MSO4 stopped.  MRI of brain repeated 7/29 revealed no stroke but did reveal new small bilateral SDH's without any mass effect.  There was no evidence of metastatic disease. EEG 7/26 did not show any epileptiform activity only generalized cerebral dysfunction. Pt had slow progression  With poor oral intact requiring NGT . She was getting ready for discharge to SNF . On 07/21/17 had decreased mentation , fever and possible seizure activity with hypotension. Labs, cxr and EEG are pending . She was started on IVF resuscitation . B/p is responding . O2 sats 96% on room air with no increased wob. ABG is ok with pH 7.4, Pco2 39  SUBJECTIVE:  MS Improved, hemodynamically stable. No acute events overnight.   VITAL SIGNS: BP (!) 142/84   Pulse (!) 104   Temp 98.9 F (37.2 C)  (Oral)   Resp (!) 25   Ht 5\' 8"  (1.727 m)   Wt 87.2 kg (192 lb 3.1 oz)   SpO2 97%   BMI 29.22 kg/m   HEMODYNAMICS:    VENTILATOR SETTINGS:    INTAKE / OUTPUT: I/O last 3 completed shifts: In: 1282.5 [I.V.:537.5; IV Piggyback:745] Out: 4696 [Urine:1025; Emesis/NG output:650]  PHYSICAL EXAMINATION: General:  Overweight elderly female in NAD Neuro:  Awake alert HEENT:  Brush/AT, No JVD noted, PERRL Cardiovascular:  RRR, no MRG Lungs:  Clear Abdomen:  Soft, non-distended Musculoskeletal:  No acute deformity Skin:  Intact, MMM   BMET  Recent Labs Lab 07/21/17 1800 07/22/17 0356 07/23/17 0625  NA 144 146* 149*  K 3.0* 3.3* 3.7  CL 110 115* 121*  CO2 23 22 22   BUN 46* 47* 33*  CREATININE 1.26* 1.06* 0.76  GLUCOSE 156* 120* 81    Electrolytes  Recent Labs Lab 07/21/17 0514 07/21/17 1800 07/22/17 0356 07/23/17 0625  CALCIUM 8.4* 7.8* 7.4* 7.5*  MG 2.1  --  1.9 1.8    CBC  Recent Labs Lab 07/21/17 1800 07/22/17 0356 07/23/17 0625  WBC 12.3* 11.5* 15.0*  HGB 8.1* 7.0* 6.6*  HCT 26.9* 23.9* 22.2*  PLT 465* 388 388    Coag's  Recent Labs Lab 07/21/17 1800  APTT 35  INR 1.66    Sepsis Markers  Recent Labs Lab 07/21/17 1800 07/22/17 0052 07/22/17 0356 07/23/17 0713  LATICACIDVEN 3.2* 2.3* 2.4* 2.0*  PROCALCITON 2.01  --   --   --  ABG  Recent Labs Lab 07/21/17 1500 07/23/17 0523  PHART 7.406 7.442  PCO2ART 39.3 35.8  PO2ART 110* 95.1    Liver Enzymes  Recent Labs Lab 07/21/17 1800 07/23/17 0625  AST 28 19  ALT 12* 13*  ALKPHOS 150* 119  BILITOT 0.5 0.5  ALBUMIN 1.4* 1.2*    Cardiac Enzymes No results for input(s): TROPONINI, PROBNP in the last 168 hours.  Glucose  Recent Labs Lab 07/22/17 0739 07/22/17 1148 07/22/17 1600 07/22/17 2155 07/23/17 0735 07/23/17 1241  GLUCAP 100* 120* 116* 95 74 67    Imaging Dg Chest Port 1 View  Result Date: 07/23/2017 CLINICAL DATA:  Dyspnea, quite weak today, status  post laparotomy in June 2018 for drainage of an intra-abdominal small bowel abscess secondary to perforation. EXAM: PORTABLE CHEST 1 VIEW COMPARISON:  Chest x-ray of July 21, 2017 and chest CT scan of the same day FINDINGS: The lungs are better inflated today. There remains increased density at the right lung base consistent with atelectasis and small effusion. There is no pneumothorax. The cardiac silhouette remains mildly enlarged. The central pulmonary vascularity is prominent. There is mild interstitial edema. The feeding tube has been removed and a nasogastric tube placed whose proximal port is at or just below the GE junction. The porta catheter tip projects over the distal third of the SVC. IMPRESSION: Persistent right basilar atelectasis or pneumonia with small right pleural effusion. Mild interstitial edema bilaterally. Advancement of the nasogastric tube by 5-10 cm would assure that the proximal port remains positioned below the GE junction. Electronically Signed   By: David  Martinique M.D.   On: 07/23/2017 08:03     STUDIES:  MRI brain 7/29 >  New tiny bilateral subdural hematomas over both posterior cerebral convexities. No mass effect.   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  No evidence of metastatic disease within limitations of motion. EEG 7/26 > generalized cerebral dysfunction, no epileptiform discharges.  CULTURES: BC x 2 7/22>>> NEG  Urine 7/21>> enterococcus C Diff neg 8/17  BC 8/18 >   ANTIBIOTICS: Cefepime 7/27>>>  SIGNIFICANT EVENTS:    LINES/TUBES:     ASSESSMENT / PLAN:  66yo female with Stage IV metastatic adenocarcinoma of the appendix admitted with GI bleeding. Course c/b small SDH, seizure, UTI, worsening encephalopathy.  Reconsulted 8/18 for hypotension. IV antibiotics were restarted with concern for recurrent intraabdominal infection. Hemodynamics responded to IVF alone. She has likely small bowel ischemia, but surgery does not see her  as a surgical candidate as her cancer is unable to be treated.   Plan Continue Empiric ABX Cultures negative to date Not a surgical candidate Not a candidate for aggressive ICU interventions Palliative following, await their discussions.   Son updated. PCCM will sign off.   Management otherwise per primary.  Georgann Housekeeper, AGACNP-BC [facility] Pulmonology/Critical Care Pager [phone] or [phone]  07/23/2017 1:03 PM

## 2017-07-23 NOTE — Progress Notes (Signed)
Neurology progress note  Subjective: No acute events overnight Patient unable to provide any history. According to the son, at bedside, she was communicative following commands for most part of yesterday, and last night and early part of this morning. She went to bed early this morning and is now in deep sleep according to the son.  Exam: Vitals:   07/22/17 2200 07/23/17 0740  BP: 120/81 135/83  Pulse: (!) 115 (!) 106  Resp: 20 18  Temp:  99 F (37.2 C)  SpO2: 100% 100%   Gen: In bed, NAD Resp: non-labored breathing, no acute distress Abd: soft, nt  Neuro: MS: Sleeping, not arousable to voice. Grimaces to noxious stim. Does not follow any commands. CN: Pupils equal round reactive, extraocular movements intact, no facial asymmetry, midline tongue. Motor: Withdraws all 4 extremities to noxious stim. Sensory: As above  Pertinent Labs: CBC    Component Value Date/Time   WBC 15.0 (H) 07/23/2017 0625   RBC 2.50 (L) 07/23/2017 0625   HGB 6.6 (LL) 07/23/2017 0625   HCT 22.2 (L) 07/23/2017 0625   PLT 388 07/23/2017 0625   MCV 88.8 07/23/2017 0625   MCH 26.4 07/23/2017 0625   MCHC 29.7 (L) 07/23/2017 0625   RDW 17.6 (H) 07/23/2017 0625   LYMPHSABS 1.1 07/23/2017 0625   MONOABS 0.6 07/23/2017 0625   EOSABS 0.2 07/23/2017 0625   BASOSABS 0.0 07/23/2017 0625   CMP     Component Value Date/Time   NA 149 (H) 07/23/2017 0625   K 3.7 07/23/2017 0625   CL 121 (H) 07/23/2017 0625   CO2 22 07/23/2017 0625   GLUCOSE 81 07/23/2017 0625   BUN 33 (H) 07/23/2017 0625   CREATININE 0.76 07/23/2017 0625   CALCIUM 7.5 (L) 07/23/2017 0625   PROT 5.0 (L) 07/23/2017 0625   ALBUMIN 1.2 (L) 07/23/2017 0625   AST 19 07/23/2017 0625   ALT 13 (L) 07/23/2017 0625   ALKPHOS 119 07/23/2017 0625   BILITOT 0.5 07/23/2017 0625   GFRNONAA >60 07/23/2017 0625   GFRAA >60 07/23/2017 8891     Current Facility-Administered Medications:  .  0.9 %  sodium chloride infusion, , Intravenous, Once,  Alekh, Kshitiz, MD .  acetaminophen (TYLENOL) solution 650 mg, 650 mg, Oral, Q6H PRN, Starla Link, Kshitiz, MD, 650 mg at 07/22/17 2133 .  dextrose 5 % and 0.45 % NaCl with KCl 40 mEq/L infusion, , Intravenous, Continuous, Alekh, Kshitiz, MD .  folic acid (FOLVITE) tablet 1 mg, 1 mg, Per Tube, Daily, Rai, Ripudeep K, MD, 1 mg at 07/22/17 1011 .  hydrALAZINE (APRESOLINE) injection 10 mg, 10 mg, Intravenous, Q6H PRN, Bonnielee Haff, MD, 10 mg at 07/02/17 0924 .  insulin aspart (novoLOG) injection 0-15 Units, 0-15 Units, Subcutaneous, TID WC, Mikhail, [place], DO, 2 Units at 07/21/17 1820 .  insulin aspart (novoLOG) injection 0-5 Units, 0-5 Units, Subcutaneous, QHS, Mikhail, [place], DO, Stopped at 07/20/17 2200 .  lacosamide (VIMPAT) 100 mg in sodium chloride 0.9 % 25 mL IVPB, 100 mg, Intravenous, Q12H, Aroor, Lanice Schwab, MD, Stopped at 07/22/17 2330 .  levETIRAcetam (KEPPRA) 1,000 mg in sodium chloride 0.9 % 100 mL IVPB, 1,000 mg, Intravenous, Q12H, Aline August, MD, Stopped at 07/23/17 0847 .  loperamide (IMODIUM) capsule 2 mg, 2 mg, Oral, PRN, Starla Link, Kshitiz, MD, 2 mg at 07/21/17 1124 .  LORazepam (ATIVAN) injection 1 mg, 1 mg, Intravenous, Q4H PRN, Starla Link, Kshitiz, MD, 1 mg at 07/21/17 1224 .  mirtazapine (REMERON) tablet 7.5 mg, 7.5 mg, Oral, QHS,  Cristal [place], DO, 7.5 mg at 07/22/17 2133 .  multivitamin liquid 15 mL, 15 mL, Per Tube, Daily, Mikhail, Maryann, DO, 15 mL at 07/22/17 1012 .  ondansetron (ZOFRAN) tablet 4 mg, 4 mg, Oral, Q6H PRN **OR** ondansetron (ZOFRAN) injection 4 mg, 4 mg, Intravenous, Q6H PRN, Gilles Chiquito B, MD .  piperacillin-tazobactam (ZOSYN) IVPB 3.375 g, 3.375 g, Intravenous, Q8H, Aline August, MD, Stopped at 07/23/17 1012 .  RESOURCE THICKENUP CLEAR, , Oral, PRN, Rai, Ripudeep K, MD .  [COMPLETED] sodium chloride 0.9 % bolus 1,000 mL, 1,000 mL, Intravenous, Once, Last Rate: 2,000 mL/hr at 07/21/17 1525, 1,000 mL at 07/21/17 1525 **AND** sodium chloride 0.9 % bolus  1,000 mL, 1,000 mL, Intravenous, Once **AND** [COMPLETED] sodium chloride 0.9 % bolus 1,000 mL, 1,000 mL, Intravenous, Once, Aline August, MD, Stopped at 07/21/17 1728 .  sodium chloride flush (NS) 0.9 % injection 10-40 mL, 10-40 mL, Intracatheter, PRN, Nita Sells, MD, 10 mL at 07/21/17 0526 .  vancomycin (VANCOCIN) IVPB 1000 mg/200 mL premix, 1,000 mg, Intravenous, Q12H, Alekh, Kshitiz, MD, Stopped at 07/23/17 0700  Facility-Administered Medications Ordered in Other Encounters:  .  sodium chloride flush (NS) 0.9 % injection 10 mL, 10 mL, Intravenous, PRN, Baird Cancer, PA-C, 10 mL at 03/26/17 1050  Impression:  66 year old woman with a past medical history of metastatic adenocarcinoma. Appendix, stroke with left-sided residual deficits, seizure disorder, seen for GI bleeding and intra-abdominal abscess in late July. Neurology consulted because of increased lethargy, garbled speech and facial droop. Imaging findings showed possible small area of restricted diffusion in the thalamus and possible bilateral small subdurals. EEG in July did not show to perform activity. Patient continued to be lethargic and was placed on continuous EEG for concern for nonconvulsive status. Video EEG showed PLEDs but no frank seizures. She was on Tegretol. She started to clinically improve but then developed ischemic bowel. Neurology was recalled because of breakthrough seizure in the setting of sepsis.  Toxic metabolic encephalopathy Seizure Metastatic cancer DI Intra-abdominal abscess Ischemic bowel Possible small thalamic stroke Possible small subdurals  Recommendations From a seizure standpoint, we would recommend continuing with the current doses of Keppra 1 g twice a day and Vimpat 100 twice a day. (Was on Tegretol, initially presumed to have seizure because of noncompliance with Tegretol, but currently nothing by mouth because of the ischemic bowel and cannot get by mouth medications).  Keppra and Vimpat in both the given IV as well as by mouth with one-to-one conversion. Correction of toxic metabolic derangements per primary team. Agree with pursuing palliative consult given stage IV cancer and other comorbidities. Please recall neurology with questions.   Amie Portland, MD [facility] [phone]  If 7pm to 7am, please call on call as listed on AMION.

## 2017-07-23 NOTE — Progress Notes (Signed)
Patient ID: Lori [name], female   DOB: 1951/09/18, 66 y.o.   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 Seen by oncology. Not thought to be a candidate for any treatment. Then she had what appears to be a seizure resulting in encephalopathy. Neurology was consulted. Palliative medicine continues to follow. Family still desires aggressive care. Patient was transferred to stepdown unit. Critical care medicine was consulted. Patient noted to have profuse urine output. 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 Mental status improving. Had MBS, started on dysphagia 2 diet however still poor oral intake, therefore NGT still in place. Patient transitioned to oral Keppra, Tegretol. Mental status improving. Neurology felt no need for lumbar puncture as mental status has been improving. Oral intake is still poor. Patient wants to continue NG tube feeding for now and wants to keep trying to improve oral intake. Overall prognosis is very poor. Patient has refused hospice evaluation. Patient will benefit from continued evaluation and follow-up by palliative care. Patient was supposed to be discharged to nursing home on 07/21/2017 But she had another seizure episode on 07/21/2017. Afterwards she became hypotensive with high fever. She was started on broad-spectrum antibiotics IV fluids and transferred to stepdown unit. Repeat CT head, chest, abdomen and pelvis were done. She was found to have significant pneumatosis suggestive of bowel ischemia and nodules in the  lungs suspicious of metastatic disease. Surgery reevaluated the patient and recommended medical management/palliative care as she is not a good surgical candidate.  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 Patient is not a surgical candidate as per general surgery - Cultures negative so far.  - Blood pressure stable for now - Change IV fluids to D5 half normal saline because of mild hypernatremia - Repeat a.m. Labs  Significant pneumatosis secondary to bowel ischemia - Surgery is recommending medical management/palliative care - We'll reconsult palliative care. Family has been still wanting to pursue aggressive measures. -Continue empiric antibiotics. If cultures negative for staph by tomorrow, discontinue vancomycin tomorrow - Follow cultures  Breakthrough seizures in a patient with history of seizure disorder -Change Keppra to IV. Neurology follow-up appreciated. CT brain was negative for any acute abnormality - Hold Tegretol for now as patient is nothing by mouth - No active seizures currently. Still drowsy but wakes up slightly as per the nursing report - Continue IV Vimpat as per neurology   Acute metabolic encephalopathy/Left Thalamic Stroke/ Bilateral small SDH -Patient was noted to have garbled speech on the afternoon of 06/28/2017 -Neurology consultation appreciated -CT head on 8/1: Evolving encephalomalacia  at suspected  left thalamic lacunar infarct. -MRI brain on 7/29: New tiny bilateral subdural hematomas, trace subarachnoid hemorrhage, punctate focus of diffusion abnormality in the dorsal thalamus -It was thought the patient was having seizure activity and she was loaded with Keppra -Patient has had continuous EEG monitoring: Progressively improving diffuse encephalopathy  -Neurology feltlumbar puncture is no longer necessary given that her mental status is improving -Patient had modified barium swallow, was placed on dysphagia 2 diet - Nothing by mouth for now because of her change in mental status -Mental Status is fluctuating   Diabetes insipidus/polyuria -Patient had excessive urine output thought to be due to central diabetes insipidus due to abnormalities noted on MRI of the brain -Sodium level was also elevated with high serum osmolality 100 -She was given DDAVP  -sodium is 149today   Primary appendiceal carcinoma with metastatic disease, stage IV with abdominal wound, status post treatment of intra-abdominal abscess -Oncology consultation appreciated, patient is not a candidate for chemotherapy and is thought to have an incurable condition. Poor prognosis -Palliative carefollowing -had recent SBO due to carcinomatosis status post laparotomy, drainage of intraabdominal abscess, repair of small bowel perforation. Urineculture grew enterococcus which was treated with IV Unasyn, completed course of antibiotics -Reconsult palliative care to continue discussions about goals of care  Rectal bleeding of unknown cause -Appears to be stable, patient is not a candidate for endoscopic evaluation due to recent surgery. This was discussed by previous hospitalist with general surgery.  Anemia secondary to acute blood loss - Hemoglobin 6.6 today. Transfuse 1 unit packed red cells. Repeat CBC in a.m.  Status post antibiotic treatment for Intra-abdominal abscess  Diabetes mellitus,  type II -Continue insulin sliding scale CBG monitoring  Essential hypertension -Hold antihypertensives because of hypotension  Enterococcus UTI -As above, patient was on Zosyn however transitioned to and completed Unasyn   Nutrition -Hold NG feeds for now  Hypokalemia -Improving. Repeat a.m. labs  Goals of care -Reconsult palliative care. Medical care at this point is likely futile.  Stage II Pressure ulcer of buttocks - follow wound care recommendations   DVT ProphylaxisSCDs  Code Status:Full  Family Communication:None at bedside  Disposition Plan:unclear at this time  Consultants PCCM General surgery Neurology  Oncology Palliative care  Procedures  Continuous EEG  Antimicrobials:            Anti-infectives    Start     Dose/Rate Route Frequency Ordered Stop   07/22/17 0500  vancomycin (VANCOCIN) IVPB 1000 mg/200 mL premix     1,000 mg 200 mL/hr over 60 Minutes Intravenous Every 12 hours 07/21/17 1545     07/21/17 2200  piperacillin-tazobactam (ZOSYN) IVPB 3.375 g     3.375 g 12.5 mL/hr over 240 Minutes Intravenous Every 8 hours 07/21/17 1545     07/21/17 1630  vancomycin (VANCOCIN) 1,500 mg in sodium chloride 0.9 % 500 mL IVPB     1,500 mg 250 mL/hr over 120 Minutes Intravenous  Once 07/21/17 1545 07/22/17 0000   07/21/17 1500  piperacillin-tazobactam (ZOSYN) IVPB 3.375 g     3.375 g 100 mL/hr over 30 Minutes Intravenous  Once 07/21/17 1449 07/21/17 1638   07/04/17 1100  Ampicillin-Sulbactam (UNASYN) 3 g in sodium chloride 0.9 % 100 mL IVPB     3 g 200 mL/hr over 30 Minutes Intravenous Every 6 hours 07/04/17 0823 07/10/17 0429   07/03/17 1600  Ampicillin-Sulbactam (UNASYN) 3 g in sodium chloride 0.9 % 100 mL IVPB  Status:  Discontinued     3  g 200 mL/hr over 30 Minutes Intravenous Every 6 hours 07/03/17 1047 07/04/17 0823   06/29/17 1400  ceFEPIme (MAXIPIME) 2 g in dextrose 5 % 50 mL IVPB  Status:  Discontinued     2  g 100 mL/hr over 30 Minutes Intravenous Every 8 hours 06/29/17 0853 06/29/17 0857   06/29/17 1000  ceFEPIme (MAXIPIME) 2 g in dextrose 5 % 50 mL IVPB  Status:  Discontinued     2 g 100 mL/hr over 30 Minutes Intravenous Every 8 hours 06/29/17 0857 07/03/17 1046   06/29/17 0930  metroNIDAZOLE (FLAGYL) IVPB 500 mg  Status:  Discontinued     500 mg 100 mL/hr over 60 Minutes Intravenous Every 8 hours 06/29/17 0838 06/30/17 0956   06/24/17 0600  piperacillin-tazobactam (ZOSYN) IVPB 3.375 g  Status:  Discontinued     3.375 g 12.5 mL/hr over 240 Minutes Intravenous Every 8 hours 06/23/17 2254 06/29/17 0838   06/23/17 2300  piperacillin-tazobactam (ZOSYN) IVPB 3.375 g     3.375 g 100 mL/hr over 30 Minutes Intravenous  Once 06/23/17 2254 06/24/17 0056       Subjective: Patient seen and examined at bedside. She is drowsy and moans with painful stimulus, but does not wake up and answers questions. She was slightly awake this morning as per the nursing staff. No overnight fever or vomiting  Objective: Vitals:   07/22/17 2000 07/22/17 2100 07/22/17 2200 07/23/17 0740  BP: 123/70 125/72 120/81 135/83  Pulse: (!) 118 (!) 119 (!) 115 (!) 106  Resp: 20 (!) 26 20 18   Temp:  98 F (36.7 C)  99 F (37.2 C)  TempSrc:    Oral  SpO2: 100% 100% 100% 100%  Weight:      Height:        Intake/Output Summary (Last 24 hours) at 07/23/17 0946 Last data filed at 07/22/17 2132  Gross per 24 hour  Intake           1282.5 ml  Output              575 ml  Net            707.5 ml   Filed Weights   07/19/17 0500 07/21/17 0557 07/22/17 0428  Weight: 86 kg (189 lb 9.5 oz) 86.1 kg (189 lb 14.4 oz) 87.2 kg (192 lb 3.1 oz)    Examination:  General exam: Appears Very ill and deconditioned  Respiratory system: Bilateral decreased breath sound at bases with scattered crackles Cardiovascular system: S1 & S2 heard, tachycardic  Gastrointestinal system: Abdomen is distended, dressing present. Bowel  sounds sluggish. Central nervous system: Almost unresponsive, only moans with painful stimulus.  Extremities: No cyanosis, clubbing; trace edema  Skin: No rashes, lesions or ulcers Lymph: No cervical lymphadenopathy    Data Reviewed: I have personally reviewed following labs and imaging studies  CBC:  Recent Labs Lab 07/20/17 0406 07/21/17 0514 07/21/17 1800 07/22/17 0356 07/23/17 0625  WBC 10.3 10.8* 12.3* 11.5* 15.0*  NEUTROABS 8.3* 8.7* 10.0* 9.8* 13.1*  HGB 8.3* 8.7* 8.1* 7.0* 6.6*  HCT 27.4* 28.6* 26.9* 23.9* 22.2*  MCV 87.8 89.4 91.2 90.2 88.8  PLT 586* 632* 465* 388 166   Basic Metabolic Panel:  Recent Labs Lab 07/19/17 0449 07/20/17 0406 07/21/17 0514 07/21/17 1800 07/22/17 0356 07/23/17 0625  NA 136 137 142 144 146* 149*  K 4.1 3.7 3.4* 3.0* 3.3* 3.7  CL 100* 99* 104 110 115* 121*  CO2 29 30 26 23 22  22  GLUCOSE 124* 166* 222* 156* 120* 81  BUN 10 18 38* 46* 47* 33*  CREATININE 0.46 0.57 0.80 1.26* 1.06* 0.76  CALCIUM 7.8* 8.2* 8.4* 7.8* 7.4* 7.5*  MG 1.3* 1.6* 2.1  --  1.9 1.8   GFR: Estimated Creatinine Clearance: 79.9 mL/min (by C-G formula based on SCr of 0.76 mg/dL). Liver Function Tests:  Recent Labs Lab 07/21/17 1800 07/23/17 0625  AST 28 19  ALT 12* 13*  ALKPHOS 150* 119  BILITOT 0.5 0.5  PROT 5.4* 5.0*  ALBUMIN 1.4* 1.2*   No results for input(s): LIPASE, AMYLASE in the last 168 hours. No results for input(s): AMMONIA in the last 168 hours. Coagulation Profile:  Recent Labs Lab 07/21/17 1800  INR 1.66   Cardiac Enzymes: No results for input(s): CKTOTAL, CKMB, CKMBINDEX, TROPONINI in the last 168 hours. BNP (last 3 results) No results for input(s): PROBNP in the last 8760 hours. HbA1C: No results for input(s): HGBA1C in the last 72 hours. CBG:  Recent Labs Lab 07/22/17 0739 07/22/17 1148 07/22/17 1600 07/22/17 2155 07/23/17 0735  GLUCAP 100* 120* 116* 95 74   Lipid Profile: No results for input(s): CHOL, HDL,  LDLCALC, TRIG, CHOLHDL, LDLDIRECT in the last 72 hours. Thyroid Function Tests: No results for input(s): TSH, T4TOTAL, FREET4, T3FREE, THYROIDAB in the last 72 hours. Anemia Panel: No results for input(s): VITAMINB12, FOLATE, FERRITIN, TIBC, IRON, RETICCTPCT in the last 72 hours. Sepsis Labs:  Recent Labs Lab 07/21/17 1800 07/22/17 0052 07/22/17 0356 07/23/17 0713  PROCALCITON 2.01  --   --   --   LATICACIDVEN 3.2* 2.3* 2.4* 2.0*    Recent Results (from the past 240 hour(s))  C difficile quick scan w PCR reflex     Status: None   Collection Time: 07/20/17 12:40 PM  Result Value Ref Range Status   C Diff antigen NEGATIVE NEGATIVE Final   C Diff toxin NEGATIVE NEGATIVE Final   C Diff interpretation No C. difficile detected.  Final  Gastrointestinal Panel by PCR , Stool     Status: None   Collection Time: 07/20/17 12:40 PM  Result Value Ref Range Status   Campylobacter species NOT DETECTED NOT DETECTED Final   Plesimonas shigelloides NOT DETECTED NOT DETECTED Final   Salmonella species NOT DETECTED NOT DETECTED Final   Yersinia enterocolitica NOT DETECTED NOT DETECTED Final   Vibrio species NOT DETECTED NOT DETECTED Final   Vibrio cholerae NOT DETECTED NOT DETECTED Final   Enteroaggregative E coli (EAEC) NOT DETECTED NOT DETECTED Final   Enteropathogenic E coli (EPEC) NOT DETECTED NOT DETECTED Final   Enterotoxigenic E coli (ETEC) NOT DETECTED NOT DETECTED Final   Shiga like toxin producing E coli (STEC) NOT DETECTED NOT DETECTED Final   Shigella/Enteroinvasive E coli (EIEC) NOT DETECTED NOT DETECTED Final   Cryptosporidium NOT DETECTED NOT DETECTED Final   Cyclospora cayetanensis NOT DETECTED NOT DETECTED Final   Entamoeba histolytica NOT DETECTED NOT DETECTED Final   Giardia lamblia NOT DETECTED NOT DETECTED Final   Adenovirus F40/41 NOT DETECTED NOT DETECTED Final   Astrovirus NOT DETECTED NOT DETECTED Final   Norovirus GI/GII NOT DETECTED NOT DETECTED Final   Rotavirus A  NOT DETECTED NOT DETECTED Final   Sapovirus (I, II, IV, and V) NOT DETECTED NOT DETECTED Final  Culture, Urine     Status: Abnormal   Collection Time: 07/21/17  3:51 PM  Result Value Ref Range Status   Specimen Description URINE, CATHETERIZED  Final   Special Requests NONE  Final   Culture MULTIPLE SPECIES PRESENT, SUGGEST RECOLLECTION (A)  Final   Report Status 07/23/2017 FINAL  Final  Culture, blood (x 2)     Status: None (Preliminary result)   Collection Time: 07/21/17  4:25 PM  Result Value Ref Range Status   Specimen Description BLOOD RIGHT HAND  Final   Special Requests   Final    BOTTLES DRAWN AEROBIC ONLY Blood Culture results may not be optimal due to an inadequate volume of blood received in culture bottles   Culture NO GROWTH < 24 HOURS  Final   Report Status PENDING  Incomplete  Culture, blood (x 2)     Status: None (Preliminary result)   Collection Time: 07/21/17  6:00 PM  Result Value Ref Range Status   Specimen Description BLOOD RIGHT ARM  Final   Special Requests   Final    BOTTLES DRAWN AEROBIC AND ANAEROBIC Blood Culture adequate volume   Culture NO GROWTH < 24 HOURS  Final   Report Status PENDING  Incomplete         Radiology Studies: Ct Abdomen Pelvis Wo Contrast  Addendum Date: 07/21/2017   ADDENDUM REPORT: 07/21/2017 20:51 ADDENDUM: Critical Value/emergent results were called by telephone at the time of interpretation on 07/21/2017 at 8:51 pm to Lovey Newcomer, NP, who verbally acknowledged these results and communicated to the surgical team involved. Electronically Signed   By: Inez Catalina M.D.   On: 07/21/2017 20:51   Result Date: 07/21/2017 CLINICAL DATA:  Abdominal pain and fevers EXAM: CT CHEST, ABDOMEN AND PELVIS WITHOUT CONTRAST TECHNIQUE: Multidetector CT imaging of the chest, abdomen and pelvis was performed following the standard protocol without IV contrast. COMPARISON:  05/23/2017, 02/02/2017 FINDINGS: CT CHEST FINDINGS Cardiovascular:  Atherosclerotic calcifications are noted without aneurysmal dilatation. Coronary calcifications are seen. No significant cardiac enlargement is noted. No significant pulmonary arterial enlargement is seen. Right chest wall port is noted. Mediastinum/Nodes: Thoracic inlet is within normal limits. There is a feeding catheter within the esophagus which extends into the proximal duodenum. No significant hilar or mediastinal adenopathy is identified at this time. Lungs/Pleura: The lungs are well aerated bilaterally. A small right-sided pleural effusion is noted with right lower lobe atelectasis. Scattered nodular changes are noted throughout both lungs. A small 5 mm nodule is noted in the left apex best seen on image number 30 of series 4. This was not well appreciated on the prior CT from June of 2018. A a small nodule seen in the right upper lobe measuring 3 mm on the prior exam has enlarged in size now measuring approximately 6 mm. The previously seen medial right upper lobe nodule is no longer appreciated and may have been postinflammatory in nature. No other significant nodules are seen. Musculoskeletal: No acute bony abnormality is noted. Degenerative changes of the thoracic spine are seen. CT ABDOMEN PELVIS FINDINGS Hepatobiliary: The liver and gallbladder appear within normal limits. Pancreas: Unremarkable. No pancreatic ductal dilatation or surrounding inflammatory changes. Spleen: Normal in size without focal abnormality. Adrenals/Urinary Tract: The adrenal glands are unremarkable. The kidneys demonstrate no renal calculi or obstructive changes. The bladder is partially decompressed by Foley catheter. Stomach/Bowel: Air and fluid filled colon is noted. There are multiple abnormal small bowel loops noted in the mid and left abdomen with evidence of date pneumatosis. Some air is tracking into the small bowel does not area although no definitive portal venous gas is noted at this time. No definitive free air to  suggest perforation is noted.  Large anterior abdominal wound is noted. Rectal tube is noted. The appendix is not well visualized and may have been surgically removed. Vascular/Lymphatic: Aortic atherosclerosis. No enlarged abdominal or pelvic lymph nodes. Reproductive: Uterus is partially visualized and show some fluid within the endometrial canal. This is somewhat unusual for patient of this age. There is fullness in the region of the right and axilla. Which appeared to be present on the prior exam. Other: Minimal free fluid is noted. Some generalized edema is noted throughout the mesenteric structures likely related to the ischemic change in the small bowel. Musculoskeletal: Degenerative changes of lumbar spine without acute abnormality. IMPRESSION: There are multiple loops of small bowel showing significant pneumatosis most consistent with bowel ischemia. The lack of IV contrast precludes full evaluation of the VAC vasculature in this region. Air is noted tracking along the branches of the superior mesenteric vein although no definitive intraluminal portal venous air is seen at this time. No definitive changes of perforation are noted. Mild free fluid is noted as well as some edema throughout the mesenteric likely reactive in nature. No definitive abscess is seen. Fullness in the region of the right adnexae is noted consistent with some cystic changes seen on prior PET-CT. Some of the free fluid is also localized in this area although again no definitive abscess is noted. Right lower lobe atelectasis and small right-sided pleural effusion. Increase in nodular densities in both lungs suspicious for progressive metastatic disease although a sizable nodule seen on prior PET-CT has resolved and may have simply been postinflammatory in nature. Continued follow-up of these nodules is recommended. These results will be called to the ordering clinician or representative by the Radiologist Assistant, and communication  documented in the PACS or zVision Dashboard. Electronically Signed: By: Inez Catalina M.D. On: 07/21/2017 19:57   Ct Head Wo Contrast  Result Date: 07/21/2017 CLINICAL DATA:  Altered mental status EXAM: CT HEAD WITHOUT CONTRAST TECHNIQUE: Contiguous axial images were obtained from the base of the skull through the vertex without intravenous contrast. COMPARISON:  07/04/2017, 07/01/2017 MRI, CT head 06/28/2017, 06/17/2017, PET-CT 02/02/2017 FINDINGS: Brain: No acute territorial infarction, new hemorrhage or intracranial mass is seen. Old encephalomalacia of the left cerebellar hemisphere. Old encephalomalacia of the right frontal lobe with coarse calcification. Moderate atrophy. Small vessel ischemic changes of the white matter. Old lacunar infarcts in the left white matter, basal ganglia and thalamus. Stable slightly enlarged ventricles likely due to atrophy. Vascular: No hyperdense vessels.  Carotid artery calcifications. Skull: Left sub occipital craniectomy. Old right frontal burr hole. No acute fracture. Incomplete fusion of anterior arch of C1. Sinuses/Orbits: Small osteoma in the left frontal ethmoidal air cells. Minimal mucosal thickening. No acute orbital abnormality. Other: Chronic anterior subluxation of the mandibular heads. IMPRESSION: 1. No definite CT evidence for acute intracranial abnormality. 2. Old encephalomalacia in the left cerebellum and right frontal lobe as previously described. Atrophy and small vessel ischemic changes of the white matter. Electronically Signed   By: Donavan Foil M.D.   On: 07/21/2017 19:47   Ct Chest Wo Contrast  Addendum Date: 07/21/2017   ADDENDUM REPORT: 07/21/2017 20:51 ADDENDUM: Critical Value/emergent results were called by telephone at the time of interpretation on 07/21/2017 at 8:51 pm to Lovey Newcomer, NP, who verbally acknowledged these results and communicated to the surgical team involved. Electronically Signed   By: Inez Catalina M.D.   On: 07/21/2017  20:51   Result Date: 07/21/2017 CLINICAL DATA:  Abdominal pain and fevers EXAM: CT  CHEST, ABDOMEN AND PELVIS WITHOUT CONTRAST TECHNIQUE: Multidetector CT imaging of the chest, abdomen and pelvis was performed following the standard protocol without IV contrast. COMPARISON:  05/23/2017, 02/02/2017 FINDINGS: CT CHEST FINDINGS Cardiovascular: Atherosclerotic calcifications are noted without aneurysmal dilatation. Coronary calcifications are seen. No significant cardiac enlargement is noted. No significant pulmonary arterial enlargement is seen. Right chest wall port is noted. Mediastinum/Nodes: Thoracic inlet is within normal limits. There is a feeding catheter within the esophagus which extends into the proximal duodenum. No significant hilar or mediastinal adenopathy is identified at this time. Lungs/Pleura: The lungs are well aerated bilaterally. A small right-sided pleural effusion is noted with right lower lobe atelectasis. Scattered nodular changes are noted throughout both lungs. A small 5 mm nodule is noted in the left apex best seen on image number 30 of series 4. This was not well appreciated on the prior CT from June of 2018. A a small nodule seen in the right upper lobe measuring 3 mm on the prior exam has enlarged in size now measuring approximately 6 mm. The previously seen medial right upper lobe nodule is no longer appreciated and may have been postinflammatory in nature. No other significant nodules are seen. Musculoskeletal: No acute bony abnormality is noted. Degenerative changes of the thoracic spine are seen. CT ABDOMEN PELVIS FINDINGS Hepatobiliary: The liver and gallbladder appear within normal limits. Pancreas: Unremarkable. No pancreatic ductal dilatation or surrounding inflammatory changes. Spleen: Normal in size without focal abnormality. Adrenals/Urinary Tract: The adrenal glands are unremarkable. The kidneys demonstrate no renal calculi or obstructive changes. The bladder is partially  decompressed by Foley catheter. Stomach/Bowel: Air and fluid filled colon is noted. There are multiple abnormal small bowel loops noted in the mid and left abdomen with evidence of date pneumatosis. Some air is tracking into the small bowel does not area although no definitive portal venous gas is noted at this time. No definitive free air to suggest perforation is noted. Large anterior abdominal wound is noted. Rectal tube is noted. The appendix is not well visualized and may have been surgically removed. Vascular/Lymphatic: Aortic atherosclerosis. No enlarged abdominal or pelvic lymph nodes. Reproductive: Uterus is partially visualized and show some fluid within the endometrial canal. This is somewhat unusual for patient of this age. There is fullness in the region of the right and axilla. Which appeared to be present on the prior exam. Other: Minimal free fluid is noted. Some generalized edema is noted throughout the mesenteric structures likely related to the ischemic change in the small bowel. Musculoskeletal: Degenerative changes of lumbar spine without acute abnormality. IMPRESSION: There are multiple loops of small bowel showing significant pneumatosis most consistent with bowel ischemia. The lack of IV contrast precludes full evaluation of the VAC vasculature in this region. Air is noted tracking along the branches of the superior mesenteric vein although no definitive intraluminal portal venous air is seen at this time. No definitive changes of perforation are noted. Mild free fluid is noted as well as some edema throughout the mesenteric likely reactive in nature. No definitive abscess is seen. Fullness in the region of the right adnexae is noted consistent with some cystic changes seen on prior PET-CT. Some of the free fluid is also localized in this area although again no definitive abscess is noted. Right lower lobe atelectasis and small right-sided pleural effusion. Increase in nodular densities in  both lungs suspicious for progressive metastatic disease although a sizable nodule seen on prior PET-CT has resolved and may have simply  been postinflammatory in nature. Continued follow-up of these nodules is recommended. These results will be called to the ordering clinician or representative by the Radiologist Assistant, and communication documented in the PACS or zVision Dashboard. Electronically Signed: By: Inez Catalina M.D. On: 07/21/2017 19:57   Dg Chest Port 1 View  Result Date: 07/23/2017 CLINICAL DATA:  Dyspnea, quite weak today, status post laparotomy in June 2018 for drainage of an intra-abdominal small bowel abscess secondary to perforation. EXAM: PORTABLE CHEST 1 VIEW COMPARISON:  Chest x-ray of July 21, 2017 and chest CT scan of the same day FINDINGS: The lungs are better inflated today. There remains increased density at the right lung base consistent with atelectasis and small effusion. There is no pneumothorax. The cardiac silhouette remains mildly enlarged. The central pulmonary vascularity is prominent. There is mild interstitial edema. The feeding tube has been removed and a nasogastric tube placed whose proximal port is at or just below the GE junction. The porta catheter tip projects over the distal third of the SVC. IMPRESSION: Persistent right basilar atelectasis or pneumonia with small right pleural effusion. Mild interstitial edema bilaterally. Advancement of the nasogastric tube by 5-10 cm would assure that the proximal port remains positioned below the GE junction. Electronically Signed   By: David  Martinique M.D.   On: 07/23/2017 08:03   Dg Chest Port 1 View  Result Date: 07/21/2017 CLINICAL DATA:  Sepsis EXAM: PORTABLE CHEST 1 VIEW COMPARISON:  June 11, 2017 FINDINGS: Central catheter tip is at the cavoatrial junction. Nasogastric tube tip is in the distal stomach. No pneumothorax. There is bibasilar atelectasis. Ill-defined opacity left lower lobe may represent superimposed  pneumonia. Heart is mildly enlarged with pulmonary vascularity within normal limits. No adenopathy. No bone lesions. IMPRESSION: Tube and catheter positions as described without pneumothorax. Bibasilar atelectasis. Suspect superimposed pneumonia left base. Stable cardiac prominence. Electronically Signed   By: Lowella Grip III M.D.   On: 07/21/2017 16:40   Dg Abd Portable 1v  Result Date: 07/22/2017 CLINICAL DATA:  Nasogastric tube placement.  Initial encounter. EXAM: PORTABLE ABDOMEN - 1 VIEW COMPARISON:  CT of the abdomen and pelvis performed 07/21/2018 FINDINGS: The patient's enteric tube is noted ending overlying the body of the stomach, with the side port about the gastroesophageal junction. Diffuse pneumatosis is noted throughout the visualized upper abdomen, as seen on CT, raising concern for bowel ischemia. No acute osseous abnormalities are seen. The visualized lung bases are grossly clear. IMPRESSION: 1. Enteric tube noted ending overlying the body of the stomach, with the side port about the gastroesophageal junction. 2. Diffuse small bowel pneumatosis throughout the visualized upper abdomen, as seen on CT, raising concern for bowel ischemia. Electronically Signed   By: Garald Balding M.D.   On: 07/22/2017 00:11        Scheduled Meds: . folic acid  1 mg Per Tube Daily  . insulin aspart  0-15 Units Subcutaneous TID WC  . insulin aspart  0-5 Units Subcutaneous QHS  . mirtazapine  7.5 mg Oral QHS  . multivitamin  15 mL Per Tube Daily   Continuous Infusions: . sodium chloride    . dextrose 5 % and 0.45 % NaCl with KCl 40 mEq/L    . lacosamide (VIMPAT) IV Stopped (07/22/17 2330)  . levETIRAcetam Stopped (07/23/17 0847)  . piperacillin-tazobactam (ZOSYN)  IV 3.375 g (07/23/17 0612)  . sodium chloride    . vancomycin Stopped (07/23/17 0700)     LOS: 30 days  Aline August, MD [facility] Pager [phone]  If 7PM-7AM, please contact  night-coverage [email or web address] Password [facility] 07/23/2017, 9:46 AM

## 2017-07-24 LAB — TYPE AND SCREEN
ABO/RH(D): O POS
ANTIBODY SCREEN: NEGATIVE
Unit division: 0

## 2017-07-24 LAB — GLUCOSE, CAPILLARY
GLUCOSE-CAPILLARY: 105 mg/dL — AB (ref 65–99)
GLUCOSE-CAPILLARY: 110 mg/dL — AB (ref 65–99)
Glucose-Capillary: 104 mg/dL — ABNORMAL HIGH (ref 65–99)
Glucose-Capillary: 106 mg/dL — ABNORMAL HIGH (ref 65–99)
Glucose-Capillary: 111 mg/dL — ABNORMAL HIGH (ref 65–99)

## 2017-07-24 LAB — COMPREHENSIVE METABOLIC PANEL
ALBUMIN: 1.1 g/dL — AB (ref 3.5–5.0)
ALK PHOS: 118 U/L (ref 38–126)
ALT: 11 U/L — ABNORMAL LOW (ref 14–54)
ANION GAP: 5 (ref 5–15)
AST: 16 U/L (ref 15–41)
BUN: 19 mg/dL (ref 6–20)
CALCIUM: 7.7 mg/dL — AB (ref 8.9–10.3)
CHLORIDE: 122 mmol/L — AB (ref 101–111)
CO2: 24 mmol/L (ref 22–32)
Creatinine, Ser: 0.63 mg/dL (ref 0.44–1.00)
GFR calc non Af Amer: >60 mL/min (ref >60)
GLUCOSE: 132 mg/dL — AB (ref 65–99)
POTASSIUM: 3 mmol/L — AB (ref 3.5–5.1)
SODIUM: 151 mmol/L — AB (ref 135–145)
Total Bilirubin: 0.8 mg/dL (ref 0.3–1.2)
Total Protein: 5 g/dL — ABNORMAL LOW (ref 6.5–8.1)

## 2017-07-24 LAB — CBC WITH DIFFERENTIAL/PLATELET
BASOS ABS: 0 10*3/uL (ref 0.0–0.1)
BASOS PCT: 0 %
EOS ABS: 0.3 10*3/uL (ref 0.0–0.7)
Eosinophils Relative: 2 %
HCT: 23.9 % — ABNORMAL LOW (ref 36.0–46.0)
HEMOGLOBIN: 7.2 g/dL — AB (ref 12.0–15.0)
LYMPHS PCT: 6 %
Lymphs Abs: 0.9 10*3/uL (ref 0.7–4.0)
MCH: 26.6 pg (ref 26.0–34.0)
MCHC: 30.1 g/dL (ref 30.0–36.0)
MCV: 88.2 fL (ref 78.0–100.0)
MONO ABS: 0.6 10*3/uL (ref 0.1–1.0)
Monocytes Relative: 4 %
NEUTROS ABS: 13.3 10*3/uL — AB (ref 1.7–7.7)
NEUTROS PCT: 88 %
PLATELETS: 407 10*3/uL — AB (ref 150–400)
RBC: 2.71 MIL/uL — ABNORMAL LOW (ref 3.87–5.11)
RDW: 17.8 % — AB (ref 11.5–15.5)
WBC: 15.1 10*3/uL — ABNORMAL HIGH (ref 4.0–10.5)

## 2017-07-24 LAB — BPAM RBC
Blood Product Expiration Date: 201808262359
ISSUE DATE / TIME: 201808201125
UNIT TYPE AND RH: 5100

## 2017-07-24 LAB — LACTIC ACID, PLASMA: LACTIC ACID, VENOUS: 0.8 mmol/L (ref 0.5–1.9)

## 2017-07-24 LAB — MAGNESIUM: Magnesium: 1.7 mg/dL (ref 1.7–2.4)

## 2017-07-24 MED ORDER — MAGNESIUM SULFATE IN D5W 1-5 GM/100ML-% IV SOLN
1.0000 g | Freq: Once | INTRAVENOUS | Status: AC
  Administered 2017-07-24: 1 g via INTRAVENOUS
  Filled 2017-07-24: qty 100

## 2017-07-24 MED ORDER — POTASSIUM CHLORIDE 10 MEQ/100ML IV SOLN
10.0000 meq | INTRAVENOUS | Status: AC
  Administered 2017-07-24: 10 meq via INTRAVENOUS
  Filled 2017-07-24: qty 100

## 2017-07-24 MED ORDER — POTASSIUM CL IN DEXTROSE 5% 20 MEQ/L IV SOLN
20.0000 meq | INTRAVENOUS | Status: DC
  Administered 2017-07-24 – 2017-07-27 (×4): 20 meq via INTRAVENOUS
  Filled 2017-07-24 (×9): qty 1000

## 2017-07-24 NOTE — Progress Notes (Signed)
Nutrition Follow-up  DOCUMENTATION CODES:   Obesity unspecified  INTERVENTION:   -RD will follow for diet advancement and supplement as appropriate -RD will continue to follow for goals of care and adjust nutrition care plan as appropriate  NUTRITION DIAGNOSIS:   Inadequate oral intake related to altered GI function as evidenced by NPO status.  Ongoing  GOAL:   Patient will meet greater than or equal to 90% of their needs  Unmet  MONITOR:   Diet advancement, Labs, Weight trends, Skin, I & O's  REASON FOR ASSESSMENT:   Consult Assessment of nutrition requirement/status  ASSESSMENT:   Lori [name] is a 66 y.o. female with medical history significant of DM2, Seizures (not on medication), HTN, HLD, GERD, cervical and appendiceal carcinoma, intracranial aneurysm repair in 1997 who presents for rectal bleeding.  8/18- d/c to SNF cancelled d/t rapid response, seizure, CT revealed large ischemic bowel; cortrak tube removed, NGT placed for decompression 8/21- NGT removed, pt allowed ice chips  Case discussed with RN, who reports no plans to start nutrition at this time. She confirms plan for palliative care re-evaluation for goals of care discussion. RN reports family continues to desire aggressive care. However, MD notes reveal that medical care may be futile at this point.   Labs reviewed: Na: 151, K: 3.0 (on IV supplementation), CBGS: 74-105.   Diet Order:  Diet - low sodium heart healthy Diet Carb Modified Diet NPO time specified Except for: Ice Chips  Skin:  Wound (see comment) (closed abdominal incision, stage 2 lt buttocks x2)  Last BM:  07/23/17 (975 ml via rectal tube)  Height:   Ht Readings from Last 1 Encounters:  07/01/17 5\' 8"  (1.727 m)    Weight:   Wt Readings from Last 1 Encounters:  07/24/17 209 lb (94.8 kg)    Ideal Body Weight:  65.9 kg  BMI:  Body mass index is 31.78 kg/m.  Estimated Nutritional Needs:   Kcal:  1900-2100  Protein:   115-130 grams  Fluid:  > 1.9 L  EDUCATION NEEDS:   Education needs no appropriate at this time  Shanaya Schneck A. Jimmye Norman, RD, LDN, CDE Pager: [phone] After hours Pager: [phone]

## 2017-07-24 NOTE — Progress Notes (Signed)
Physical Therapy Treatment Patient Details Name: Lori [name] MRN: [id number] DOB: 1951/05/10 Today's Date: 07/24/2017    History of Present Illness pt is a 66 y/o female with pmh significant for DM , Seizure d/o HTN, h/o brain aneurysm, cervical CA, metastatic mucinous adenocarcinoma of the appendix, admitted from SNF with c/o bloody bowel movements.  In hospital pt appeared to have seizure resulting in encephalopathy.  CT/MRI's suggest L thalamic, L cerebellar and right frontal lobe encephalomalacia    PT Comments    Pt admitted with above diagnosis. Pt currently with functional limitations due to balance and endurance deficits. Pt was able to sit EOB 15 min with +2 min to max assist.  Practiced working on sitting balance withpt easily distracted.  Will follow acutely.  Pt will benefit from skilled PT to increase their independence and safety with mobility to allow discharge to the venue listed below.     Follow Up Recommendations  SNF;Supervision/Assistance - 24 hour     Equipment Recommendations  Wheelchair (measurements PT)    Recommendations for Other Services       Precautions / Restrictions Precautions Precautions: Fall Precaution Comments: rectal tube, foley Restrictions Weight Bearing Restrictions: No    Mobility  Bed Mobility Overal bed mobility: Needs Assistance Bed Mobility: Rolling;Sidelying to Sit;Sit to Sidelying Rolling: Mod assist (ro right) Sidelying to sit: Total assist;HOB elevated;+2 for physical assistance Supine to sit: Total assist;+2 for physical assistance Sit to supine: Total assist;+2 for physical assistance Sit to sidelying: Total assist General bed mobility comments: Pt did not assist other than moving her LEs slightly  Transfers                 General transfer comment: unable  Ambulation/Gait             General Gait Details: unable   Stairs            Wheelchair Mobility    Modified Rankin (Stroke Patients  Only)       Balance Overall balance assessment: Needs assistance Sitting-balance support: Single extremity supported Sitting balance-Leahy Scale: Poor Sitting balance - Comments: sat EOB x15 min working on sitting balance, upright midline posture, kicking each leg out several reps, reaching.  Emphasis on holding head up.  Pt needed min to max assist with posterior and right lateral lean.  Postural control: Posterior lean;Right lateral lean                                  Cognition Arousal/Alertness: Awake/alert Behavior During Therapy: Flat affect Overall Cognitive Status: Impaired/Different from baseline Area of Impairment: Attention;Following commands;Safety/judgement;Awareness;Problem solving                   Current Attention Level: Focused (rarely sustained)   Following Commands: Follows one step commands inconsistently;Follows one step commands with increased time Safety/Judgement: Decreased awareness of deficits;Decreased awareness of safety Awareness: Intellectual Problem Solving: Slow processing;Requires verbal cues;Requires tactile cues General Comments: patient pleasent and interactive but flat throughout session.       Exercises General Exercises - Lower Extremity Long Arc Quad: AROM;Both;5 reps;Seated    General Comments General comments (skin integrity, edema, etc.): Pt distracted easily.  Kept calling to her sister and was angry she wasn't in room.       Pertinent Vitals/Pain Pain Assessment: Faces Faces Pain Scale: Hurts little more Pain Location: abdomen and LEs Pain Descriptors / Indicators: Aching;Grimacing;Guarding Pain Intervention(s): Limited  activity within patient's tolerance;Monitored during session;Premedicated before session;Repositioned    Home Living                      Prior Function            PT Goals (current goals can now be found in the care plan section) Progress towards PT goals: Progressing toward  goals    Frequency    Min 2X/week      PT Plan Current plan remains appropriate    Co-evaluation              AM-PAC PT "6 Clicks" Daily Activity  Outcome Measure  Difficulty turning over in bed (including adjusting bedclothes, sheets and blankets)?: Unable Difficulty moving from lying on back to sitting on the side of the bed? : Unable Difficulty sitting down on and standing up from a chair with arms (e.g., wheelchair, bedside commode, etc,.)?: Unable Help needed moving to and from a bed to chair (including a wheelchair)?: Total Help needed walking in hospital room?: Total Help needed climbing 3-5 steps with a railing? : Total 6 Click Score: 6    End of Session   Activity Tolerance: Patient limited by fatigue;Patient limited by pain Patient left: in bed;with call bell/phone within reach;with family/visitor present (son in room with blanket over his head sleeping entire rx) Nurse Communication: Mobility status;Need for lift equipment PT Visit Diagnosis: Other abnormalities of gait and mobility (R26.89);Other symptoms and signs involving the nervous system (R29.898);Muscle weakness (generalized) (M62.81) Pain - Right/Left: Left Pain - part of body:  (abdomen)     Time: 9191-6606 PT Time Calculation (min) (ACUTE ONLY): 16 min  Charges:  $Therapeutic Activity: 8-22 mins                    G Codes:       Mionna Advincula,PT Acute Rehabilitation [phone] (pager)    Denice Paradise 07/24/2017, 12:02 PM

## 2017-07-24 NOTE — Progress Notes (Signed)
Patient ID: Lori [name], female   DOB: 1951-05-20, 66 y.o.   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 Seen by oncology. Not thought to be a candidate for any treatment. Then she had what appears to be a seizure resulting in encephalopathy. Neurology was consulted. Palliative medicine continues to follow. Family still desires aggressive care. Patient was transferred to stepdown unit. Critical care medicine was consulted. Patient noted to have profuse urine output. 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 Mental status improving. Had MBS, started on dysphagia 2 diet however still poor oral intake, therefore NGT still in place. Patient transitioned to oral Keppra, Tegretol. Mental status improving. Neurology felt no need for lumbar puncture as mental status has been improving. Oral intake is still poor. Patient wants to continue NG tube feeding for now and wants to keep trying to improve oral intake. Overall prognosis is very poor. Patient has refused hospice evaluation. Patient will benefit from continued evaluation and follow-up by palliative care. Patient was supposed to be discharged to nursing home on 07/21/2017 But she had another seizure episode on 07/21/2017. Afterwards she became hypotensive with high fever. She was started on broad-spectrum antibiotics IV fluids and transferred to stepdown unit. Repeat CT head, chest, abdomen and pelvis were done. She was found to have significant pneumatosis suggestive of bowel ischemia and nodules in the  lungs suspicious of metastatic disease. Surgery reevaluated the patient and recommended medical management/palliative care asshe is not a good surgical candidate.   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 Discontinue vancomycin as there is no evidence of MRSA infection. Continue Zosyn for now. Patient is not a surgical candidate as per general surgery - Cultures negative so far.  - Blood pressure stable for now - Change IV fluids to D5 because of  hypernatremia - Repeat a.m. Labs  Significant pneumatosis secondary to bowel ischemia - Surgery is recommending medical management/palliative care - Family still interested in pursuing aggressive measures as per palliative care -NG tube is being removed today as per surgery recommendations. Spoke to Dr. Donne Hazel about the plan. - Overall prognosis is very poor. Follow further recommendations from general surgery including diet changes  Breakthrough seizures in a patient with history of seizure disorder -Continue IV Keppra and Vimpat as per neurology recommendations.  Neurology follow-up appreciated. CT brain was negative for any acute abnormality - Hold Tegretol for now as patient is nothing by mouth - No active seizures currently. Patient is more awake today - Continue IV Vimpat as per neurology   Acute metabolic  encephalopathy/Left Thalamic  Stroke/ Bilateral small SDH -Patient was noted to have garbled speech on the afternoon of 06/28/2017 -Neurology consultation appreciated -CT head on 8/1: Evolving encephalomalacia at suspected left thalamic lacunar infarct. -MRI brain on 7/29: New tiny bilateral subdural hematomas, trace subarachnoid hemorrhage, punctate focus of diffusion abnormality in the dorsal thalamus -It was thought the patient was having seizure activity and she was loaded with Keppra -Patient has had continuous EEG monitoring: Progressively improving diffuse encephalopathy  -Neurology feltlumbar puncture is no longer necessary given that her mental status is improving -Patient had modified barium swallow, was placed on dysphagia 2 diet - Nothing by mouth for now because of her change in mental status -Mental Status is fluctuating: She is much more awake today   Hypernatremia and hypokalemia - Change IV fluids to D5 with IV KCl - Repeat a.m. labs   Primary appendiceal carcinoma with metastatic disease, stage IV with abdominal wound, status post treatment of intra-abdominal abscess -Oncology consultation appreciated, patient is not a candidate for chemotherapy and is thought to have an incurable condition. Poor prognosis -had recent SBO due to carcinomatosis status post laparotomy, drainage of intraabdominal abscess, repair of small bowel perforation. Urineculture grew enterococcus which was treated with IV Unasyn, completed course of antibiotics   Rectal bleeding of unknown cause -Appears to be stable, patient is not a candidate for endoscopic evaluation due to recent surgery. This was discussed by previous hospitalist with general surgery.  Acute on chronic anemia - Hemoglobin 7.2 today. No need for transfusion today. Repeat CBC in a.m.  Status post antibiotic treatment for Intra-abdominal abscess  Diabetes mellitus, type II -Continue insulin sliding scale CBG monitoring  Essential  hypertension -Hold antihypertensives today but might have to restart antihypertensives if blood pressure remains elevated  Enterococcus UTI -As above, patient was on Zosyn however transitioned to and completed Unasyn   Nutrition -Diet advancement as per surgery recommendations. Currently nothing by mouth  Goals of care --Palliative care has had multiple discussions patient and family members and patient remains full code   Stage II Pressure ulcer of buttocks - follow wound care recommendations   DVT ProphylaxisSCDs  Code Status:Full  Family Communication:None at bedside  Disposition Plan:unclear at this time  Consultants PCCM General surgery Neurology  Oncology Palliative care  Procedures  Continuous EEG  Antimicrobials:                             Anti-infectives   Start   Dose/Rate Route Frequency Ordered Stop   07/22/17 0500  vancomycin (VANCOCIN) IVPB 1000 mg/200 mL premix   1,000 mg 200 mL/hr over 60 Minutes Intravenous Every 12 hours 07/21/17 1545    07/21/17 2200  piperacillin-tazobactam (ZOSYN) IVPB 3.375 g   3.375 g 12.5 mL/hr over 240 Minutes Intravenous Every 8 hours 07/21/17 1545    07/21/17 1630  vancomycin (VANCOCIN) 1,500 mg in sodium chloride 0.9 % 500 mL IVPB   1,500 mg 250 mL/hr over 120 Minutes Intravenous Once 07/21/17 1545 07/22/17 0000   07/21/17 1500  piperacillin-tazobactam (ZOSYN) IVPB 3.375 g   3.375 g 100 mL/hr over 30 Minutes Intravenous Once 07/21/17 1449 07/21/17 1638   07/04/17 1100  Ampicillin-Sulbactam (UNASYN) 3 g in sodium chloride 0.9 % 100 mL IVPB   3 g 200 mL/hr over 30 Minutes Intravenous Every 6 hours 07/04/17 0823 07/10/17 0429   07/03/17 1600  Ampicillin-Sulbactam (UNASYN) 3 g in sodium chloride 0.9 % 100 mL IVPBStatus:Discontinued  3 g 200 mL/hr over 30 Minutes Intravenous Every 6 hours 07/03/17 1047 07/04/17 0823   06/29/17 1400  ceFEPIme (MAXIPIME) 2  g in dextrose 5 % 50 mL IVPBStatus:Discontinued    2 g 100 mL/hr over 30 Minutes Intravenous Every 8 hours 06/29/17 0853 06/29/17 0857   06/29/17 1000  ceFEPIme (MAXIPIME) 2 g in dextrose 5 % 50 mL IVPBStatus:Discontinued    2 g 100 mL/hr over 30 Minutes Intravenous Every 8 hours 06/29/17 0857 07/03/17 1046   06/29/17 0930  metroNIDAZOLE (FLAGYL) IVPB 500 mgStatus:Discontinued    500 mg 100 mL/hr over 60 Minutes Intravenous Every 8 hours 06/29/17 0838 06/30/17 0956   06/24/17 0600  piperacillin-tazobactam (ZOSYN) IVPB 3.375 gStatus:Discontinued    3.375 g 12.5 mL/hr over 240 Minutes Intravenous Every 8 hours 06/23/17 2254 06/29/17 0838   06/23/17 2300  piperacillin-tazobactam (ZOSYN) IVPB 3.375 g   3.375 g 100 mL/hr over 30 Minutes Intravenous Once 06/23/17 2254 06/24/17 0056       Subjective: Patient seen and examined at bedside. She is more awake today and is asking for her family. She is hungry and wants to eat. No overnight fever or vomiting  Objective: Vitals:   07/24/17 0300 07/24/17 0346 07/24/17 0400 07/24/17 0823  BP: (!) 165/85  (!) 146/91 (!) 163/98  Pulse: 97  (!) 102   Resp: 13  20   Temp: 98.1 F (36.7 C)   98.5 F (36.9 C)  TempSrc: Axillary   Oral  SpO2: 96%  100%   Weight:  94.8 kg (209 lb)    Height:        Intake/Output Summary (Last 24 hours) at 07/24/17 1035 Last data filed at 07/24/17 0402  Gross per 24 hour  Intake          2878.75 ml  Output             1975 ml  Net           903.75 ml   Filed Weights   07/22/17 0428 07/23/17 2300 07/24/17 0346  Weight: 87.2 kg (192 lb 3.1 oz) 95 kg (209 lb 8 oz) 94.8 kg (209 lb)    Examination:  General exam: Appears Ill and deconditioned but awake and answering some questions today Respiratory system: Bilateral decreased breath sound at bases with scattered crackles Cardiovascular system: S1 & S2 heard, slightly tachycardic  Gastrointestinal system: Abdomen is distended,  dressing present. Bowel sounds sluggish  Central nervous system: Awake and alert. No focal neurological deficits. Moving extremities Extremities: No cyanosis, clubbing; trace edema Skin: No rashes, lesions or ulcers Psychiatry: No cervical lymphadenopathy    Data Reviewed: I have personally reviewed following labs and imaging studies  CBC:  Recent Labs Lab 07/21/17 0514 07/21/17 1800 07/22/17 0356 07/23/17 0625 07/24/17 0500  WBC 10.8* 12.3* 11.5* 15.0* 15.1*  NEUTROABS 8.7* 10.0* 9.8* 13.1* 13.3*  HGB 8.7* 8.1* 7.0* 6.6* 7.2*  HCT 28.6* 26.9* 23.9* 22.2* 23.9*  MCV 89.4 91.2 90.2 88.8 88.2  PLT 632* 465* 388 388 741*   Basic Metabolic Panel:  Recent Labs Lab 07/20/17 0406 07/21/17 0514 07/21/17 1800 07/22/17 0356 07/23/17 0625 07/24/17 0500  NA 137 142 144 146* 149* 151*  K 3.7 3.4* 3.0* 3.3* 3.7 3.0*  CL 99* 104 110 115* 121* 122*  CO2 30 26 23 22 22 24   GLUCOSE 166* 222* 156* 120* 81 132*  BUN 18 38* 46* 47* 33* 19  CREATININE 0.57 0.80 1.26* 1.06* 0.76 0.63  CALCIUM 8.2*  8.4* 7.8* 7.4* 7.5* 7.7*  MG 1.6* 2.1  --  1.9 1.8 1.7   GFR: Estimated Creatinine Clearance: 83.3 mL/min (by C-G formula based on SCr of 0.63 mg/dL). Liver Function Tests:  Recent Labs Lab 07/21/17 1800 07/23/17 0625 07/24/17 0500  AST 28 19 16   ALT 12* 13* 11*  ALKPHOS 150* 119 118  BILITOT 0.5 0.5 0.8  PROT 5.4* 5.0* 5.0*  ALBUMIN 1.4* 1.2* 1.1*   No results for input(s): LIPASE, AMYLASE in the last 168 hours. No results for input(s): AMMONIA in the last 168 hours. Coagulation Profile:  Recent Labs Lab 07/21/17 1800  INR 1.66   Cardiac Enzymes: No results for input(s): CKTOTAL, CKMB, CKMBINDEX, TROPONINI in the last 168 hours. BNP (last 3 results) No results for input(s): PROBNP in the last 8760 hours. HbA1C: No results for input(s): HGBA1C in the last 72 hours. CBG:  Recent Labs Lab 07/23/17 1814 07/23/17 1932 07/23/17 2341 07/24/17 0517 07/24/17 0809    GLUCAP 88 74 88 105* 104*   Lipid Profile: No results for input(s): CHOL, HDL, LDLCALC, TRIG, CHOLHDL, LDLDIRECT in the last 72 hours. Thyroid Function Tests: No results for input(s): TSH, T4TOTAL, FREET4, T3FREE, THYROIDAB in the last 72 hours. Anemia Panel: No results for input(s): VITAMINB12, FOLATE, FERRITIN, TIBC, IRON, RETICCTPCT in the last 72 hours. Sepsis Labs:  Recent Labs Lab 07/21/17 1800 07/22/17 0052 07/22/17 0356 07/23/17 0713 07/24/17 0500  PROCALCITON 2.01  --   --   --   --   LATICACIDVEN 3.2* 2.3* 2.4* 2.0* 0.8    Recent Results (from the past 240 hour(s))  C difficile quick scan w PCR reflex     Status: None   Collection Time: 07/20/17 12:40 PM  Result Value Ref Range Status   C Diff antigen NEGATIVE NEGATIVE Final   C Diff toxin NEGATIVE NEGATIVE Final   C Diff interpretation No C. difficile detected.  Final  Gastrointestinal Panel by PCR , Stool     Status: None   Collection Time: 07/20/17 12:40 PM  Result Value Ref Range Status   Campylobacter species NOT DETECTED NOT DETECTED Final   Plesimonas shigelloides NOT DETECTED NOT DETECTED Final   Salmonella species NOT DETECTED NOT DETECTED Final   Yersinia enterocolitica NOT DETECTED NOT DETECTED Final   Vibrio species NOT DETECTED NOT DETECTED Final   Vibrio cholerae NOT DETECTED NOT DETECTED Final   Enteroaggregative E coli (EAEC) NOT DETECTED NOT DETECTED Final   Enteropathogenic E coli (EPEC) NOT DETECTED NOT DETECTED Final   Enterotoxigenic E coli (ETEC) NOT DETECTED NOT DETECTED Final   Shiga like toxin producing E coli (STEC) NOT DETECTED NOT DETECTED Final   Shigella/Enteroinvasive E coli (EIEC) NOT DETECTED NOT DETECTED Final   Cryptosporidium NOT DETECTED NOT DETECTED Final   Cyclospora cayetanensis NOT DETECTED NOT DETECTED Final   Entamoeba histolytica NOT DETECTED NOT DETECTED Final   Giardia lamblia NOT DETECTED NOT DETECTED Final   Adenovirus F40/41 NOT DETECTED NOT DETECTED Final    Astrovirus NOT DETECTED NOT DETECTED Final   Norovirus GI/GII NOT DETECTED NOT DETECTED Final   Rotavirus A NOT DETECTED NOT DETECTED Final   Sapovirus (I, II, IV, and V) NOT DETECTED NOT DETECTED Final  Culture, Urine     Status: Abnormal   Collection Time: 07/21/17  3:51 PM  Result Value Ref Range Status   Specimen Description URINE, CATHETERIZED  Final   Special Requests NONE  Final   Culture MULTIPLE SPECIES PRESENT, SUGGEST RECOLLECTION (A)  Final   Report Status 07/23/2017 FINAL  Final  Culture, blood (x 2)     Status: None (Preliminary result)   Collection Time: 07/21/17  4:25 PM  Result Value Ref Range Status   Specimen Description BLOOD RIGHT HAND  Final   Special Requests   Final    BOTTLES DRAWN AEROBIC ONLY Blood Culture results may not be optimal due to an inadequate volume of blood received in culture bottles   Culture NO GROWTH 3 DAYS  Final   Report Status PENDING  Incomplete  Culture, blood (x 2)     Status: None (Preliminary result)   Collection Time: 07/21/17  6:00 PM  Result Value Ref Range Status   Specimen Description BLOOD RIGHT ARM  Final   Special Requests   Final    BOTTLES DRAWN AEROBIC AND ANAEROBIC Blood Culture adequate volume   Culture NO GROWTH 3 DAYS  Final   Report Status PENDING  Incomplete         Radiology Studies: Dg Chest Port 1 View  Result Date: 07/23/2017 CLINICAL DATA:  Dyspnea, quite weak today, status post laparotomy in June 2018 for drainage of an intra-abdominal small bowel abscess secondary to perforation. EXAM: PORTABLE CHEST 1 VIEW COMPARISON:  Chest x-ray of July 21, 2017 and chest CT scan of the same day FINDINGS: The lungs are better inflated today. There remains increased density at the right lung base consistent with atelectasis and small effusion. There is no pneumothorax. The cardiac silhouette remains mildly enlarged. The central pulmonary vascularity is prominent. There is mild interstitial edema. The feeding tube has  been removed and a nasogastric tube placed whose proximal port is at or just below the GE junction. The porta catheter tip projects over the distal third of the SVC. IMPRESSION: Persistent right basilar atelectasis or pneumonia with small right pleural effusion. Mild interstitial edema bilaterally. Advancement of the nasogastric tube by 5-10 cm would assure that the proximal port remains positioned below the GE junction. Electronically Signed   By: David  Martinique M.D.   On: 07/23/2017 08:03        Scheduled Meds: . folic acid  1 mg Per Tube Daily  . insulin aspart  0-15 Units Subcutaneous TID WC  . insulin aspart  0-5 Units Subcutaneous QHS  . mirtazapine  7.5 mg Oral QHS  . multivitamin  15 mL Per Tube Daily   Continuous Infusions: . sodium chloride    . dextrose 5 % with KCl 20 mEq / L 20 mEq (07/24/17 0910)  . lacosamide (VIMPAT) IV Stopped (07/23/17 2226)  . levETIRAcetam Stopped (07/23/17 2132)  . piperacillin-tazobactam (ZOSYN)  IV 3.375 g (07/24/17 0512)  . sodium chloride    . vancomycin Stopped (07/24/17 0502)     LOS: 31 days        Aline August, MD [facility] Pager [phone]  If 7PM-7AM, please contact night-coverage [email or web address] Password [email or web address] 07/24/2017, 10:35 AM

## 2017-07-24 NOTE — Progress Notes (Signed)
ANTIBIOTIC CONSULT NOTE - f/u  Pharmacy Consult for Zosyn Indication: Sepsis, intra-abdominal abscess, UTI  Allergies  Allergen Reactions  . Contrast Media [Iodinated Diagnostic Agents] Itching and Swelling  . Dilantin [Phenytoin Sodium Extended] Itching, Swelling and Other (See Comments)    Whole body  . Latex Hives and Itching  . Adhesive [Tape] Rash    Patient Measurements: Height: 5\' 8"  (172.7 cm) Weight: 209 lb (94.8 kg) IBW/kg (Calculated) : 63.9 Adjusted Body Weight:    Vital Signs: Temp: 98.2 F (36.8 C) (08/21 1145) Temp Source: Oral (08/21 1145) BP: 153/91 (08/21 1145) Pulse Rate: 99 (08/21 1145) Intake/Output from previous day: 08/20 0701 - 08/21 0700 In: 2878.8 [I.V.:1293.8; Blood:710; IV Piggyback:875] Out: 7106 [Urine:1350; Emesis/NG output:150; Stool:975] Intake/Output from this shift: Total I/O In: -  Out: 550 [Urine:550]  Labs:  Recent Labs  07/22/17 0356 07/23/17 0625 07/24/17 0500  WBC 11.5* 15.0* 15.1*  HGB 7.0* 6.6* 7.2*  PLT 388 388 407*  CREATININE 1.06* 0.76 0.63   Estimated Creatinine Clearance: 83.3 mL/min (by C-G formula based on SCr of 0.63 mg/dL). No results for input(s): VANCOTROUGH, VANCOPEAK, VANCORANDOM, GENTTROUGH, GENTPEAK, GENTRANDOM, TOBRATROUGH, TOBRAPEAK, TOBRARND, AMIKACINPEAK, AMIKACINTROU, AMIKACIN in the last 72 hours.   Microbiology:   Medical History: Past Medical History:  Diagnosis Date  . Anemia 2017   3u blood 2017  . Arthritis    KNEES  . Carcinoma of appendix ([place]) 04/11/2016   Patient with a history of cervical cancer status post chemoradiation with a persistent finding of a dilated appendix with hypermetabolism. This is raised a concern of malignancy. We'll plan to move forward with laparoscopic possible open appendectomy to rule out malignancy.   . Cervical cancer, FIGO stage IIB ([place]) 05/24/2015  . 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 Depression   . Endometrial carcinoma [facility])    goes to [facility] in [place] for chemotherapy  . GERD (gastroesophageal reflux disease)   . Heart murmur   . History of blood transfusion   . 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 History of radiation therapy 07/28/15-08/26/15   cervical region 27.5 gray  . History of seizures    post-op craniotomy for aneurysm 1997-- controlled w/ medication since then no seizures  . Hyperlipidemia   . Hypertension   . PMB (postmenopausal bleeding)   . Pulmonary nodule 03/26/2017  . Seizures ([place])    hx of years ago   . Type 2 diabetes, diet controlled ([place])    diet controlled     Assessment:  ID: [id number] Vanco/Zosyn restarted for sepsis 2/2 Intra-abdominal abscess (ischemic bowel seen on CT) vs. cancer metastasis -- empiric coverage for now. Not a surgical candidate given prognosis.  - Tmax 99, WBC 15.1 up, LA up 2.4, Scr 0.76 down - Recently treated for Kleb pneumo UTI. No real UTI symptoms on this admit, but CCM wanted to treat (s/p Unasyn).   Vancomycin 8/18 >>8/21 Zosyn 7/22 >>7/26; 8/18 >> Cefepime 7/26 >>7/31 Flagyl 7/26 >> 7/28 Unasyn 7/31>>8/7  7/21 UCx: [employer]) but w/out sxs 722 BCx: ngf 8/8 MRSA PCR: neg 8/17 Cdiff: neg 8/17 GI panel: neg 8/18 BCxr x 2>> 8/18 UCx: multiple species   Goal of Therapy:  Eradication of infection  Plan:  D/c  Vancomycin MD would like to continue Zosyn Zosyn 3.375g IV q 8 hrs. Pharmacy will sign off. Please reconsult for further dosing assitance.    Lori [name], PharmD, BCPS Clinical Staff Pharmacist Pager [phone]  Lori [name] 07/24/2017,1:47 PM

## 2017-07-24 NOTE — Progress Notes (Signed)
Subjective/Chief Complaint: Awake alert, asking where her son is hiding, no abd pain wants tube out   Objective: Vital signs in last 24 hours: Temp:  [98.1 F (36.7 C)-98.9 F (37.2 C)] 98.1 F (36.7 C) (08/21 0300) Pulse Rate:  [93-107] 102 (08/21 0400) Resp:  [13-26] 20 (08/21 0400) BP: (124-165)/(76-98) 146/91 (08/21 0400) SpO2:  [96 %-100 %] 100 % (08/21 0400) Weight:  [94.8 kg (209 lb)-95 kg (209 lb 8 oz)] 94.8 kg (209 lb) (08/21 0346) Last BM Date: 07/23/17  Intake/Output from previous day: 08/20 0701 - 08/21 0700 In: 2878.8 [I.V.:1293.8; Blood:710; IV Piggyback:875] Out: 9622 [Urine:1350; Emesis/NG output:150; Stool:975] Intake/Output this shift: No intake/output data recorded.  GI: wound with exudate and visible suture, some bs present nontender  Lab Results:   Recent Labs  07/23/17 0625 07/24/17 0500  WBC 15.0* 15.1*  HGB 6.6* 7.2*  HCT 22.2* 23.9*  PLT 388 407*   BMET  Recent Labs  07/23/17 0625 07/24/17 0500  NA 149* 151*  K 3.7 3.0*  CL 121* 122*  CO2 22 24  GLUCOSE 81 132*  BUN 33* 19  CREATININE 0.76 0.63  CALCIUM 7.5* 7.7*   PT/INR  Recent Labs  07/21/17 1800  LABPROT 19.8*  INR 1.66   ABG  Recent Labs  07/21/17 1500 07/23/17 0523  PHART 7.406 7.442  HCO3 23.5 24.0    Studies/Results: Dg Chest Port 1 View  Result Date: 07/23/2017 CLINICAL DATA:  Dyspnea, quite weak today, status post laparotomy in June 2018 for drainage of an intra-abdominal small bowel abscess secondary to perforation. EXAM: PORTABLE CHEST 1 VIEW COMPARISON:  Chest x-ray of July 21, 2017 and chest CT scan of the same day FINDINGS: The lungs are better inflated today. There remains increased density at the right lung base consistent with atelectasis and small effusion. There is no pneumothorax. The cardiac silhouette remains mildly enlarged. The central pulmonary vascularity is prominent. There is mild interstitial edema. The feeding tube has been removed  and a nasogastric tube placed whose proximal port is at or just below the GE junction. The porta catheter tip projects over the distal third of the SVC. IMPRESSION: Persistent right basilar atelectasis or pneumonia with small right pleural effusion. Mild interstitial edema bilaterally. Advancement of the nasogastric tube by 5-10 cm would assure that the proximal port remains positioned below the GE junction. Electronically Signed   By: David  Martinique M.D.   On: 07/23/2017 08:03    Anti-infectives: Anti-infectives    Start     Dose/Rate Route Frequency Ordered Stop   07/22/17 0500  vancomycin (VANCOCIN) IVPB 1000 mg/200 mL premix     1,000 mg 200 mL/hr over 60 Minutes Intravenous Every 12 hours 07/21/17 1545     07/21/17 2200  piperacillin-tazobactam (ZOSYN) IVPB 3.375 g     3.375 g 12.5 mL/hr over 240 Minutes Intravenous Every 8 hours 07/21/17 1545     07/21/17 1630  vancomycin (VANCOCIN) 1,500 mg in sodium chloride 0.9 % 500 mL IVPB     1,500 mg 250 mL/hr over 120 Minutes Intravenous  Once 07/21/17 1545 07/22/17 0000   07/21/17 1500  piperacillin-tazobactam (ZOSYN) IVPB 3.375 g     3.375 g 100 mL/hr over 30 Minutes Intravenous  Once 07/21/17 1449 07/21/17 1638   07/04/17 1100  Ampicillin-Sulbactam (UNASYN) 3 g in sodium chloride 0.9 % 100 mL IVPB     3 g 200 mL/hr over 30 Minutes Intravenous Every 6 hours 07/04/17 0823 07/10/17 0429  07/03/17 1600  Ampicillin-Sulbactam (UNASYN) 3 g in sodium chloride 0.9 % 100 mL IVPB  Status:  Discontinued     3 g 200 mL/hr over 30 Minutes Intravenous Every 6 hours 07/03/17 1047 07/04/17 0823   06/29/17 1400  ceFEPIme (MAXIPIME) 2 g in dextrose 5 % 50 mL IVPB  Status:  Discontinued     2 g 100 mL/hr over 30 Minutes Intravenous Every 8 hours 06/29/17 0853 06/29/17 0857   06/29/17 1000  ceFEPIme (MAXIPIME) 2 g in dextrose 5 % 50 mL IVPB  Status:  Discontinued     2 g 100 mL/hr over 30 Minutes Intravenous Every 8 hours 06/29/17 0857 07/03/17 1046    06/29/17 0930  metroNIDAZOLE (FLAGYL) IVPB 500 mg  Status:  Discontinued     500 mg 100 mL/hr over 60 Minutes Intravenous Every 8 hours 06/29/17 0838 06/30/17 0956   06/24/17 0600  piperacillin-tazobactam (ZOSYN) IVPB 3.375 g  Status:  Discontinued     3.375 g 12.5 mL/hr over 240 Minutes Intravenous Every 8 hours 06/23/17 2254 06/29/17 0838   06/23/17 2300  piperacillin-tazobactam (ZOSYN) IVPB 3.375 g     3.375 g 100 mL/hr over 30 Minutes Intravenous  Once 06/23/17 2254 06/24/17 0056      Assessment/Plan: Primary appendiceal carcinoma with metastatic disease, stage IV  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 [name]: 1. 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  2. Exploratory laparotomy, drainage of abdominal abscess, repair of small bowel perforation, repair of small bowel enterotomy, enterocolostomy (mid ileum to mid transverse colon). - local wound care, can continue some debridement but I dont this this will likely ever heal at this point Likely small bowel ischemia, pneumatosis on ct  -I agree with prior notes and in discussion with Dr Kae Heller that no surgery can resolve this issue.  Her cancer is not really able to be treated and I think surgery would not really be possible or change outcome - will dc ng tube today and can have ice chips- her exam is benign, continue abx   [facility] 07/24/2017

## 2017-07-25 ENCOUNTER — Inpatient Hospital Stay (HOSPITAL_COMMUNITY): Payer: Medicare Other

## 2017-07-25 LAB — COMPREHENSIVE METABOLIC PANEL
ALBUMIN: 1.2 g/dL — AB (ref 3.5–5.0)
ALK PHOS: 98 U/L (ref 38–126)
ALT: 11 U/L — ABNORMAL LOW (ref 14–54)
AST: 16 U/L (ref 15–41)
Anion gap: 5 (ref 5–15)
BILIRUBIN TOTAL: 0.4 mg/dL (ref 0.3–1.2)
BUN: 9 mg/dL (ref 6–20)
CALCIUM: 7.4 mg/dL — AB (ref 8.9–10.3)
CO2: 23 mmol/L (ref 22–32)
CREATININE: 0.52 mg/dL (ref 0.44–1.00)
Chloride: 120 mmol/L — ABNORMAL HIGH (ref 101–111)
GFR calc Af Amer: >60 mL/min (ref >60)
GLUCOSE: 110 mg/dL — AB (ref 65–99)
Potassium: 2.4 mmol/L — CL (ref 3.5–5.1)
Sodium: 148 mmol/L — ABNORMAL HIGH (ref 135–145)
TOTAL PROTEIN: 4.9 g/dL — AB (ref 6.5–8.1)

## 2017-07-25 LAB — CBC WITH DIFFERENTIAL/PLATELET
BASOS PCT: 0 %
Basophils Absolute: 0 10*3/uL (ref 0.0–0.1)
EOS PCT: 3 %
Eosinophils Absolute: 0.3 10*3/uL (ref 0.0–0.7)
HEMATOCRIT: 25.7 % — AB (ref 36.0–46.0)
HEMOGLOBIN: 8 g/dL — AB (ref 12.0–15.0)
LYMPHS ABS: 1.1 10*3/uL (ref 0.7–4.0)
Lymphocytes Relative: 10 %
MCH: 27.5 pg (ref 26.0–34.0)
MCHC: 31.1 g/dL (ref 30.0–36.0)
MCV: 88.3 fL (ref 78.0–100.0)
MONO ABS: 0.6 10*3/uL (ref 0.1–1.0)
MONOS PCT: 5 %
Neutro Abs: 9 10*3/uL — ABNORMAL HIGH (ref 1.7–7.7)
Neutrophils Relative %: 82 %
Platelets: 443 10*3/uL — ABNORMAL HIGH (ref 150–400)
RBC: 2.91 MIL/uL — AB (ref 3.87–5.11)
RDW: 17.9 % — ABNORMAL HIGH (ref 11.5–15.5)
WBC: 11 10*3/uL — AB (ref 4.0–10.5)

## 2017-07-25 LAB — GLUCOSE, CAPILLARY
GLUCOSE-CAPILLARY: 86 mg/dL (ref 65–99)
Glucose-Capillary: 100 mg/dL — ABNORMAL HIGH (ref 65–99)
Glucose-Capillary: 98 mg/dL (ref 65–99)
Glucose-Capillary: 107 mg/dL — ABNORMAL HIGH (ref 65–99)

## 2017-07-25 LAB — MAGNESIUM: MAGNESIUM: 1.4 mg/dL — AB (ref 1.7–2.4)

## 2017-07-25 LAB — LACTIC ACID, PLASMA: Lactic Acid, Venous: 0.6 mmol/L (ref 0.5–1.9)

## 2017-07-25 MED ORDER — POTASSIUM CHLORIDE 10 MEQ/100ML IV SOLN
10.0000 meq | INTRAVENOUS | Status: AC
  Administered 2017-07-25 – 2017-07-26 (×6): 10 meq via INTRAVENOUS
  Filled 2017-07-25 (×7): qty 100

## 2017-07-25 MED ORDER — MAGNESIUM SULFATE 4 GM/100ML IV SOLN
4.0000 g | Freq: Once | INTRAVENOUS | Status: AC
  Administered 2017-07-25: 4 g via INTRAVENOUS
  Filled 2017-07-25: qty 100

## 2017-07-25 NOTE — Progress Notes (Signed)
CRITICAL VALUE ALERT  Critical Value: Potassium 2.4  Date & Time Notied:  07/25/17 1416  Provider Notified: Dr. Ree Kida  Orders Received/Actions taken: KCL 20 mEq x6

## 2017-07-25 NOTE — Progress Notes (Signed)
[facility] Progress Note     Subjective: CC:  Somnolent this AM. Per nurse received ativan this AM.   Objective: Vital signs in last 24 hours: Temp:  [98.2 F (36.8 C)-98.5 F (36.9 C)] 98.3 F (36.8 C) (08/22 0727) Pulse Rate:  [95-106] 95 (08/22 0403) Resp:  [16-26] 20 (08/22 0403) BP: (153-164)/(89-98) 164/95 (08/22 0403) SpO2:  [99 %-100 %] 99 % (08/22 0403) Weight:  [94.3 kg (207 lb 14.3 oz)] 94.3 kg (207 lb 14.3 oz) (08/22 0403) Last BM Date: 07/23/17  Intake/Output from previous day: 08/21 0701 - 08/22 0700 In: -  Out: 550 [Urine:550] Intake/Output this shift: No intake/output data recorded.  PE: Gen:  somenolent, NAD, cooperative CV: RRR Pulm:  Normal effort, clear to auscultation bilaterally Abd: Soft, non-tender, non-distended, hypoactive but present BS, midline incision w/ dressing in place c/d/i Skin: warm and dry, no rashes  Psych: A&Ox3   Lab Results:   Recent Labs  07/23/17 0625 07/24/17 0500  WBC 15.0* 15.1*  HGB 6.6* 7.2*  HCT 22.2* 23.9*  PLT 388 407*   BMET  Recent Labs  07/23/17 0625 07/24/17 0500  NA 149* 151*  K 3.7 3.0*  CL 121* 122*  CO2 22 24  GLUCOSE 81 132*  BUN 33* 19  CREATININE 0.76 0.63  CALCIUM 7.5* 7.7*   PT/INR No results for input(s): LABPROT, INR in the last 72 hours. CMP     Component Value Date/Time   NA 151 (H) 07/24/2017 0500   K 3.0 (L) 07/24/2017 0500   CL 122 (H) 07/24/2017 0500   CO2 24 07/24/2017 0500   GLUCOSE 132 (H) 07/24/2017 0500   BUN 19 07/24/2017 0500   CREATININE 0.63 07/24/2017 0500   CALCIUM 7.7 (L) 07/24/2017 0500   PROT 5.0 (L) 07/24/2017 0500   ALBUMIN 1.1 (L) 07/24/2017 0500   AST 16 07/24/2017 0500   ALT 11 (L) 07/24/2017 0500   ALKPHOS 118 07/24/2017 0500   BILITOT 0.8 07/24/2017 0500   GFRNONAA >60 07/24/2017 0500   GFRAA >60 07/24/2017 0500   Lipase     Component Value Date/Time   LIPASE 31 05/14/2017 2106       Studies/Results: Dg Chest Port 1  View  Result Date: 07/23/2017 CLINICAL DATA:  Dyspnea, quite weak today, status post laparotomy in June 2018 for drainage of an intra-abdominal small bowel abscess secondary to perforation. EXAM: PORTABLE CHEST 1 VIEW COMPARISON:  Chest x-ray of July 21, 2017 and chest CT scan of the same day FINDINGS: The lungs are better inflated today. There remains increased density at the right lung base consistent with atelectasis and small effusion. There is no pneumothorax. The cardiac silhouette remains mildly enlarged. The central pulmonary vascularity is prominent. There is mild interstitial edema. The feeding tube has been removed and a nasogastric tube placed whose proximal port is at or just below the GE junction. The porta catheter tip projects over the distal third of the SVC. IMPRESSION: Persistent right basilar atelectasis or pneumonia with small right pleural effusion. Mild interstitial edema bilaterally. Advancement of the nasogastric tube by 5-10 cm would assure that the proximal port remains positioned below the GE junction. Electronically Signed   By: David  Martinique M.D.   On: 07/23/2017 08:03    Anti-infectives: Anti-infectives    Start     Dose/Rate Route Frequency Ordered Stop   07/22/17 0500  vancomycin (VANCOCIN) IVPB 1000 mg/200 mL premix  Status:  Discontinued     1,000 mg 200  mL/hr over 60 Minutes Intravenous Every 12 hours 07/21/17 1545 07/24/17 1047   07/21/17 2200  piperacillin-tazobactam (ZOSYN) IVPB 3.375 g     3.375 g 12.5 mL/hr over 240 Minutes Intravenous Every 8 hours 07/21/17 1545     07/21/17 1630  vancomycin (VANCOCIN) 1,500 mg in sodium chloride 0.9 % 500 mL IVPB     1,500 mg 250 mL/hr over 120 Minutes Intravenous  Once 07/21/17 1545 07/22/17 0000   07/21/17 1500  piperacillin-tazobactam (ZOSYN) IVPB 3.375 g     3.375 g 100 mL/hr over 30 Minutes Intravenous  Once 07/21/17 1449 07/21/17 1638   07/04/17 1100  Ampicillin-Sulbactam (UNASYN) 3 g in sodium chloride 0.9 % 100  mL IVPB     3 g 200 mL/hr over 30 Minutes Intravenous Every 6 hours 07/04/17 0823 07/10/17 0429   07/03/17 1600  Ampicillin-Sulbactam (UNASYN) 3 g in sodium chloride 0.9 % 100 mL IVPB  Status:  Discontinued     3 g 200 mL/hr over 30 Minutes Intravenous Every 6 hours 07/03/17 1047 07/04/17 0823   06/29/17 1400  ceFEPIme (MAXIPIME) 2 g in dextrose 5 % 50 mL IVPB  Status:  Discontinued     2 g 100 mL/hr over 30 Minutes Intravenous Every 8 hours 06/29/17 0853 06/29/17 0857   06/29/17 1000  ceFEPIme (MAXIPIME) 2 g in dextrose 5 % 50 mL IVPB  Status:  Discontinued     2 g 100 mL/hr over 30 Minutes Intravenous Every 8 hours 06/29/17 0857 07/03/17 1046   06/29/17 0930  metroNIDAZOLE (FLAGYL) IVPB 500 mg  Status:  Discontinued     500 mg 100 mL/hr over 60 Minutes Intravenous Every 8 hours 06/29/17 0838 06/30/17 0956   06/24/17 0600  piperacillin-tazobactam (ZOSYN) IVPB 3.375 g  Status:  Discontinued     3.375 g 12.5 mL/hr over 240 Minutes Intravenous Every 8 hours 06/23/17 2254 06/29/17 0838   06/23/17 2300  piperacillin-tazobactam (ZOSYN) IVPB 3.375 g     3.375 g 100 mL/hr over 30 Minutes Intravenous  Once 06/23/17 2254 06/24/17 0056       Assessment/Plan Primary appendiceal carcinoma with metastatic disease, stage IV  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 [name]: 1. 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  2. Exploratory laparotomy, drainage of abdominal abscess, repair of small bowel perforation, repair of small bowel enterotomy, enterocolostomy (mid ileum to mid transverse colon). - local wound care, can continue some debridement but I dont this this will likely ever heal at this point Likely small bowel ischemia, pneumatosis on ct  - Do not recommend surgical intervention. Stage 4 cancer is not amenable to surgical treatment and operative intervention for pneumatosis  would unlikely change patient outcome.  - Continue NPO and ice chips- her exam is benign, continue abx    LOS: 32 days    Jill Alexanders , [facility] 07/25/2017, 7:35 AM Pager: [phone] Consults: [phone] Mon-Fri 7:00 am-4:30 pm Sat-Sun 7:00 am-11:30 am

## 2017-07-25 NOTE — Progress Notes (Signed)
Patient ID: Lori [name], female   DOB: 09-15-1951, 66 y.o.   MRN: [id number]  Patient continues to fail to thrive, intermittent confusion and only ice chips for po intake.  She has metastatic cancer and she is not a surgical or systemic therapy candidate.  Family continues to hold to their wish for any intervention that will prolong life.   In attempt to set up another family meeting I left message for son Lori [name] but had no call back.   I discussed with attending, Dr Ree Kida, this difficult situation.  She is always available to speak with family.   I will not be in the hospital tomorrow but the PMT team will make another attempt to contact family to schedule a meeting to re-address current situation and GOCs at this time of continued decline.     Perhaps family are in a place to shift to a more comfort approach.    No charge  Wadie Lessen NP  Palliative Medicine Team Team Phone # [phone] Pager [phone]

## 2017-07-25 NOTE — Progress Notes (Signed)
Patient refuses to have blood drawn this morning. Patient states "i want to see my son, no one is drawing my blood until I see my son"

## 2017-07-25 NOTE — Care Management (Addendum)
Pt transferred from floor.  CM informed by [facility] that pt has been declined for [facility] per insurance ([facility] intiated insurance auth 8/14 when pt was on another unit) - attending declined performing a peer to peer.  Discharge plan currently SNF - CSW continues to follow along however pt does not have adequate nutrition (tube feeds stopped last week to increase appetite) and now NG has been removed - pt started on ice chips yesterday and the ischemic bowel is the main barrier to advancing diet.  Palliative is involved and facilitating ongoing [place] daily with family.

## 2017-07-25 NOTE — Progress Notes (Signed)
PROGRESS NOTE    Lori [name]  WIO:[id number] DOB: 1951-04-07 DOA: 06/23/2017 PCP: [name], MD   Chief Complaint  Patient presents with  . 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 She was at a skilled nursing facility. She was recently hospitalized for small bowel obstruction and underwent diagnostic laparoscopy in June. She underwent repair of small bowel perforation. She had a prolonged hospital stay. Subsequently was discharged to skilled nursing facility. Presented back with rectal bleeding. Seen by oncology. Not thought to be a candidate for any treatment. Then she had what appears to be a seizure resulting in encephalopathy. Neurology was consulted. Palliative medicine continues to follow. Family still desires aggressive care. Patient was transferred to stepdown unit. Critical care medicine was consulted. Patient noted to have profuse urine output. 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 Patient improving. Had MBS, started on dysphagia 2 diet however still poor oral intake, therefore NGT still in place. Patient transitioned to oral Keppra, Tegretol. Mental status improving. Neurology felt no need for lumbar puncture as mental status has been improving. Have held tube feeds in hopes that patient will become hungry and increase her PO intake so that the NG tube can be removed.   Assessment & Plan   Severe sepsis secondary to pneumatosis and bowel ischemia -Placed on vancomycin and Zosyn initially however vancomycin was discontinued as there is no evidence of MRSA -Patient still not candidate for surgery per general surgery -Blood cultures show no growth to date continue IV fluids -Surgery recommended medical management -NG  tube removed  Breakthrough seizure/history of seizure disorder -Neurology consulted and appreciated -Patient placed on IV Keppra and Vimpat -CT brain unremarkable -Tegretol held -Continue seizure precautions  Acute metabolic encephalopathy/Left Thalamic Stroke/ Bilateral small SDH -Patient was noted to have garbled speech on the afternoon of 06/28/2017 -Neurology consultation appreciated -CT head on 8/1: Evolving encephalomalacia at suspected left thalamic lacunar infarct. Previously identified tiny bilateral subdural hematoma -MRI brain on 7/29: New tiny bilateral subdural hematomas, trace subarachnoid hemorrhage, punctate focus of diffusion abnormality in the dorsal thalamus -It was thought the patient was having seizure activity and she was loaded with Keppra -Patient has had continuous EEG monitoring: Progressively improving diffuse encephalopathy  -Neurology feels lumbar puncture is no longer necessary given that her mental status is improving -Patient had modified barium swallow, was placed on dysphagia 2 diet, however currently is NPO due to seizure episode  Diabetes insipidus/polyuria/Hypernatremia -Patient had excessive urine output thought to be due to central diabetes insipidus due to abnormalities noted on MRI of the brain -Resolved however patient was then placed on IV fluids after seizure episode. Sodium currently 151 -Continue D5 -Monitor BMP -Continue to monitor urine output, over the past 24 hours 1150cc  Primary appendiceal carcinoma with metastatic disease, stage IV with abdominal wound -Oncology consultation appreciated, patient is not a candidate for chemotherapy and is thought to have an incurable condition. Poor prognosis -Palliative care was consulted. 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 surgery consulted and following- recommended TID wet to dry dressing  changes -Of note, patient completed course of IV Unasyn which was used to treat urinary tract infection and covered her abdominal abscess  Rectal bleeding of unknown cause -Appears to be  stable, patient is not a candidate for endoscopic evaluation due to recent surgery. This was discussed by previous hospitalist with general surgery.  Anemia secondary to acute blood loss -Hemoglobin currently 7.1, appears stable -transfuse if hemoglobin <7 -Continue to monitor CBC  Diabetes mellitus, type II -Continue insulin sliding scale CBG monitoring  Essential hypertension -Continue clonidine, hydralazine as needed  Enterococcus UTI -resolved, treated with unasyn  Nutrition -Continues to be encephalopathic with poor oral intake -Was seen by nutrition/speech therapy and placed on dysphagia diet however patient did have a seizure -NG tube was removed -Currently NPO -Continue D5 -Continue Remeron -Likely not a good candidate for PEG tube given her carcinomatosis  Hypokalemia/hypomagnesemia -Will replace and continue to monitor  Stage II pressure ulcer of the buttocks -Continue wound care  Goals of care -Palliative care was consulted and appreciated however family wishes to continue aggressive care -Feel patient has an overall poor prognosis given her poor oral intake, seizure activity, multiple comorbidities, stage IV appendiceal cancer  DVT Prophylaxis  SCDs  Code Status: Full  Family Communication: None at bedside  Disposition Plan: Admitted. Dispo TBD  Consultants PCCM General surgery Neurology  Oncology Palliative care  Procedures  Continuous EEG  Antibiotics   Anti-infectives    Start     Dose/Rate Route Frequency Ordered Stop   07/22/17 0500  vancomycin (VANCOCIN) IVPB 1000 mg/200 mL premix  Status:  Discontinued     1,000 mg 200 mL/hr over 60 Minutes Intravenous Every 12 hours 07/21/17 1545 07/24/17 1047   07/21/17 2200  piperacillin-tazobactam (ZOSYN) IVPB  3.375 g     3.375 g 12.5 mL/hr over 240 Minutes Intravenous Every 8 hours 07/21/17 1545     07/21/17 1630  vancomycin (VANCOCIN) 1,500 mg in sodium chloride 0.9 % 500 mL IVPB     1,500 mg 250 mL/hr over 120 Minutes Intravenous  Once 07/21/17 1545 07/22/17 0000   07/21/17 1500  piperacillin-tazobactam (ZOSYN) IVPB 3.375 g     3.375 g 100 mL/hr over 30 Minutes Intravenous  Once 07/21/17 1449 07/21/17 1638   07/04/17 1100  Ampicillin-Sulbactam (UNASYN) 3 g in sodium chloride 0.9 % 100 mL IVPB     3 g 200 mL/hr over 30 Minutes Intravenous Every 6 hours 07/04/17 0823 07/10/17 0429   07/03/17 1600  Ampicillin-Sulbactam (UNASYN) 3 g in sodium chloride 0.9 % 100 mL IVPB  Status:  Discontinued     3 g 200 mL/hr over 30 Minutes Intravenous Every 6 hours 07/03/17 1047 07/04/17 0823   06/29/17 1400  ceFEPIme (MAXIPIME) 2 g in dextrose 5 % 50 mL IVPB  Status:  Discontinued     2 g 100 mL/hr over 30 Minutes Intravenous Every 8 hours 06/29/17 0853 06/29/17 0857   06/29/17 1000  ceFEPIme (MAXIPIME) 2 g in dextrose 5 % 50 mL IVPB  Status:  Discontinued     2 g 100 mL/hr over 30 Minutes Intravenous Every 8 hours 06/29/17 0857 07/03/17 1046   06/29/17 0930  metroNIDAZOLE (FLAGYL) IVPB 500 mg  Status:  Discontinued     500 mg 100 mL/hr over 60 Minutes Intravenous Every 8 hours 06/29/17 0838 06/30/17 0956   06/24/17 0600  piperacillin-tazobactam (ZOSYN) IVPB 3.375 g  Status:  Discontinued     3.375 g 12.5 mL/hr over 240 Minutes Intravenous Every 8 hours 06/23/17 2254 06/29/17 0838   06/23/17 2300  piperacillin-tazobactam (ZOSYN) IVPB 3.375 g     3.375 g 100 mL/hr over 30 Minutes Intravenous  Once 06/23/17 2254 06/24/17  9163      Subjective:   Kathe Wirick seen and examined today. Discussed with nursing, patient had just received Ativan prior to my evaluation. Currently very somnolent.  Objective:   Vitals:   07/25/17 0403 07/25/17 0727 07/25/17 1013 07/25/17 1100  BP: (!) 164/95  (!) 185/114  (!) 133/104  Pulse: 95   (!) 117  Resp: 20   (!) 25  Temp: 98.3 F (36.8 C) 98.3 F (36.8 C)    TempSrc: Oral Axillary    SpO2: 99%   99%  Weight: 94.3 kg (207 lb 14.3 oz)     Height:        Intake/Output Summary (Last 24 hours) at 07/25/17 1138 Last data filed at 07/25/17 0946  Gross per 24 hour  Intake                0 ml  Output             1700 ml  Net            -1700 ml   Filed Weights   07/23/17 2300 07/24/17 0346 07/25/17 0403  Weight: 95 kg (209 lb 8 oz) 94.8 kg (209 lb) 94.3 kg (207 lb 14.3 oz)   Exam  General: Well developed,  Chronically ill appearing, no apparent distress  HEENT: NCAT, mucous membranes moist.   Cardiovascular: S1 S2 auscultated, tachycardic  Respiratory: Diminished breath sounds, anteriorly  Abdomen: Soft, obese, nontender, + bowel sounds, dressing in place  Extremities: warm dry without cyanosis clubbing or edema  Neuro: Currently asleep, cannot fully assess  Data Reviewed: I have personally reviewed following labs and imaging studies  CBC:  Recent Labs Lab 07/21/17 0514 07/21/17 1800 07/22/17 0356 07/23/17 0625 07/24/17 0500  WBC 10.8* 12.3* 11.5* 15.0* 15.1*  NEUTROABS 8.7* 10.0* 9.8* 13.1* 13.3*  HGB 8.7* 8.1* 7.0* 6.6* 7.2*  HCT 28.6* 26.9* 23.9* 22.2* 23.9*  MCV 89.4 91.2 90.2 88.8 88.2  PLT 632* 465* 388 388 846*   Basic Metabolic Panel:  Recent Labs Lab 07/20/17 0406 07/21/17 0514 07/21/17 1800 07/22/17 0356 07/23/17 0625 07/24/17 0500  NA 137 142 144 146* 149* 151*  K 3.7 3.4* 3.0* 3.3* 3.7 3.0*  CL 99* 104 110 115* 121* 122*  CO2 30 26 23 22 22 24   GLUCOSE 166* 222* 156* 120* 81 132*  BUN 18 38* 46* 47* 33* 19  CREATININE 0.57 0.80 1.26* 1.06* 0.76 0.63  CALCIUM 8.2* 8.4* 7.8* 7.4* 7.5* 7.7*  MG 1.6* 2.1  --  1.9 1.8 1.7   GFR: Estimated Creatinine Clearance: 83.1 mL/min (by C-G formula based on SCr of 0.63 mg/dL). Liver Function Tests:  Recent Labs Lab 07/21/17 1800 07/23/17 0625  07/24/17 0500  AST 28 19 16   ALT 12* 13* 11*  ALKPHOS 150* 119 118  BILITOT 0.5 0.5 0.8  PROT 5.4* 5.0* 5.0*  ALBUMIN 1.4* 1.2* 1.1*   No results for input(s): LIPASE, AMYLASE in the last 168 hours. No results for input(s): AMMONIA in the last 168 hours. Coagulation Profile:  Recent Labs Lab 07/21/17 1800  INR 1.66   Cardiac Enzymes: No results for input(s): CKTOTAL, CKMB, CKMBINDEX, TROPONINI in the last 168 hours. BNP (last 3 results) No results for input(s): PROBNP in the last 8760 hours. HbA1C: No results for input(s): HGBA1C in the last 72 hours. CBG:  Recent Labs Lab 07/24/17 0809 07/24/17 1144 07/24/17 1600 07/24/17 2142 07/25/17 0728  GLUCAP 104* 110* 106* 111* 100*   Lipid Profile:  No results for input(s): CHOL, HDL, LDLCALC, TRIG, CHOLHDL, LDLDIRECT in the last 72 hours. Thyroid Function Tests: No results for input(s): TSH, T4TOTAL, FREET4, T3FREE, THYROIDAB in the last 72 hours. Anemia Panel: No results for input(s): VITAMINB12, FOLATE, FERRITIN, TIBC, IRON, RETICCTPCT in the last 72 hours. Urine analysis:    Component Value Date/Time   COLORURINE AMBER (A) 07/21/2017 1552   APPEARANCEUR HAZY (A) 07/21/2017 1552   LABSPEC 1.024 07/21/2017 1552   LABSPEC 1.010 08/17/2015 1542   PHURINE 5.0 07/21/2017 1552   GLUCOSEU NEGATIVE 07/21/2017 1552   GLUCOSEU 100 08/17/2015 1542   HGBUR SMALL (A) 07/21/2017 1552   BILIRUBINUR NEGATIVE 07/21/2017 1552   BILIRUBINUR Negative 08/17/2015 1542   KETONESUR NEGATIVE 07/21/2017 1552   PROTEINUR 30 (A) 07/21/2017 1552   UROBILINOGEN 1.0 08/26/2015 0725   UROBILINOGEN 0.2 08/17/2015 1542   NITRITE NEGATIVE 07/21/2017 1552   LEUKOCYTESUR SMALL (A) 07/21/2017 1552   LEUKOCYTESUR Trace 08/17/2015 1542   Sepsis Labs: @LABRCNTIP (procalcitonin:4,lacticidven:4)  ) Recent Results (from the past 240 hour(s))  C difficile quick scan w PCR reflex     Status: None   Collection Time: 07/20/17 12:40 PM  Result Value Ref  Range Status   C Diff antigen NEGATIVE NEGATIVE Final   C Diff toxin NEGATIVE NEGATIVE Final   C Diff interpretation No C. difficile detected.  Final  Gastrointestinal Panel by PCR , Stool     Status: None   Collection Time: 07/20/17 12:40 PM  Result Value Ref Range Status   Campylobacter species NOT DETECTED NOT DETECTED Final   Plesimonas shigelloides NOT DETECTED NOT DETECTED Final   Salmonella species NOT DETECTED NOT DETECTED Final   Yersinia enterocolitica NOT DETECTED NOT DETECTED Final   Vibrio species NOT DETECTED NOT DETECTED Final   Vibrio cholerae NOT DETECTED NOT DETECTED Final   Enteroaggregative E coli (EAEC) NOT DETECTED NOT DETECTED Final   Enteropathogenic E coli (EPEC) NOT DETECTED NOT DETECTED Final   Enterotoxigenic E coli (ETEC) NOT DETECTED NOT DETECTED Final   Shiga like toxin producing E coli (STEC) NOT DETECTED NOT DETECTED Final   Shigella/Enteroinvasive E coli (EIEC) NOT DETECTED NOT DETECTED Final   Cryptosporidium NOT DETECTED NOT DETECTED Final   Cyclospora cayetanensis NOT DETECTED NOT DETECTED Final   Entamoeba histolytica NOT DETECTED NOT DETECTED Final   Giardia lamblia NOT DETECTED NOT DETECTED Final   Adenovirus F40/41 NOT DETECTED NOT DETECTED Final   Astrovirus NOT DETECTED NOT DETECTED Final   Norovirus GI/GII NOT DETECTED NOT DETECTED Final   Rotavirus A NOT DETECTED NOT DETECTED Final   Sapovirus (I, II, IV, and V) NOT DETECTED NOT DETECTED Final  Culture, Urine     Status: Abnormal   Collection Time: 07/21/17  3:51 PM  Result Value Ref Range Status   Specimen Description URINE, CATHETERIZED  Final   Special Requests NONE  Final   Culture MULTIPLE SPECIES PRESENT, SUGGEST RECOLLECTION (A)  Final   Report Status 07/23/2017 FINAL  Final  Culture, blood (x 2)     Status: None (Preliminary result)   Collection Time: 07/21/17  4:25 PM  Result Value Ref Range Status   Specimen Description BLOOD RIGHT HAND  Final   Special Requests   Final     BOTTLES DRAWN AEROBIC ONLY Blood Culture results may not be optimal due to an inadequate volume of blood received in culture bottles   Culture NO GROWTH 4 DAYS  Final   Report Status PENDING  Incomplete  Culture, blood (x  2)     Status: None (Preliminary result)   Collection Time: 07/21/17  6:00 PM  Result Value Ref Range Status   Specimen Description BLOOD RIGHT ARM  Final   Special Requests   Final    BOTTLES DRAWN AEROBIC AND ANAEROBIC Blood Culture adequate volume   Culture NO GROWTH 4 DAYS  Final   Report Status PENDING  Incomplete      Radiology Studies: Dg Chest Port 1 View  Result Date: 07/25/2017 CLINICAL DATA:  Back pain history of appendiceal adenocarcinoma and peritoneal carcinomatosis. Laparotomy for intra-abdominal abscess drainage on May 30, 2017, previous CVA, cervical malignancy, former smoker. EXAM: PORTABLE CHEST 1 VIEW COMPARISON:  Portable chest x-ray of July 23, 2017 FINDINGS: The right lung is mildly hypoinflated with the left lung better inflated. There remains increased interstitial density in the right mid and lower lung. There is no large pleural effusion. The cardiac silhouette is enlarged. The pulmonary vascularity is not engorged. There is calcification in the wall of the aortic arch. The Port-A-Cath tip projects over the distal third of the SVC. The nasogastric tube has been removed. IMPRESSION: Fairly stable appearance of the chest. Right basilar atelectasis or pneumonia. Mild cardiomegaly and central pulmonary vascular congestion without definite pulmonary edema. Electronically Signed   By: David  Martinique M.D.   On: 07/25/2017 07:50     Scheduled Meds: . folic acid  1 mg Per Tube Daily  . insulin aspart  0-15 Units Subcutaneous TID WC  . insulin aspart  0-5 Units Subcutaneous QHS  . mirtazapine  7.5 mg Oral QHS  . multivitamin  15 mL Per Tube Daily   Continuous Infusions: . sodium chloride    . dextrose 5 % with KCl 20 mEq / L 20 mEq (07/24/17 0910)    . lacosamide (VIMPAT) IV Stopped (07/24/17 2307)  . levETIRAcetam 1,000 mg (07/25/17 0910)  . piperacillin-tazobactam (ZOSYN)  IV 3.375 g (07/25/17 0549)     LOS: 32 days   Time Spent in minutes   30 minutes  Dariusz Brase D.O. on 07/25/2017 at 11:38 AM  Between 7am to 7pm - Pager - [phone]  After 7pm go to [email or web address] - password [email or web address]  And look for the night coverage person covering for me after hours  [facility] Office  [phone]

## 2017-07-25 NOTE — Progress Notes (Signed)
Pt wide awake now.  No family in the room.  She doesn't remember anyone being in this AM to see her.  She wants to know if she can have some coffee.  Taking some ice chips already.  Will up to chips and sips.  I told the nurse she could have some coffee.

## 2017-07-26 DIAGNOSIS — R0600 Dyspnea, unspecified: Secondary | ICD-10-CM

## 2017-07-26 LAB — BASIC METABOLIC PANEL WITH GFR
Anion gap: 5 (ref 5–15)
BUN: 7 mg/dL (ref 6–20)
CO2: 23 mmol/L (ref 22–32)
Calcium: 7.3 mg/dL — ABNORMAL LOW (ref 8.9–10.3)
Chloride: 116 mmol/L — ABNORMAL HIGH (ref 101–111)
Creatinine, Ser: 0.61 mg/dL (ref 0.44–1.00)
GFR calc Af Amer: >60 mL/min
GFR calc non Af Amer: >60 mL/min
Glucose, Bld: 148 mg/dL — ABNORMAL HIGH (ref 65–99)
Potassium: 3.2 mmol/L — ABNORMAL LOW (ref 3.5–5.1)
Sodium: 144 mmol/L (ref 135–145)

## 2017-07-26 LAB — CBC
HEMATOCRIT: 25.9 % — AB (ref 36.0–46.0)
HEMOGLOBIN: 7.8 g/dL — AB (ref 12.0–15.0)
MCH: 26.3 pg (ref 26.0–34.0)
MCHC: 30.1 g/dL (ref 30.0–36.0)
MCV: 87.2 fL (ref 78.0–100.0)
Platelets: 439 10*3/uL — ABNORMAL HIGH (ref 150–400)
RBC: 2.97 MIL/uL — AB (ref 3.87–5.11)
RDW: 17.4 % — ABNORMAL HIGH (ref 11.5–15.5)
WBC: 10.7 10*3/uL — ABNORMAL HIGH (ref 4.0–10.5)

## 2017-07-26 LAB — GLUCOSE, CAPILLARY
GLUCOSE-CAPILLARY: 111 mg/dL — AB (ref 65–99)
Glucose-Capillary: 105 mg/dL — ABNORMAL HIGH (ref 65–99)
Glucose-Capillary: 127 mg/dL — ABNORMAL HIGH (ref 65–99)
Glucose-Capillary: 142 mg/dL — ABNORMAL HIGH (ref 65–99)

## 2017-07-26 LAB — CULTURE, BLOOD (ROUTINE X 2)
Culture: NO GROWTH
Culture: NO GROWTH
SPECIAL REQUESTS: ADEQUATE

## 2017-07-26 MED ORDER — ADULT MULTIVITAMIN W/MINERALS CH
1.0000 | ORAL_TABLET | Freq: Every day | ORAL | Status: DC
  Administered 2017-07-26 – 2017-08-09 (×12): 1 via ORAL
  Filled 2017-07-26 (×13): qty 1

## 2017-07-26 MED ORDER — BOOST / RESOURCE BREEZE PO LIQD
1.0000 | Freq: Three times a day (TID) | ORAL | Status: DC
  Administered 2017-07-26 – 2017-07-27 (×3): 1 via ORAL

## 2017-07-26 NOTE — Progress Notes (Signed)
Physical Therapy Treatment Patient Details Name: Lori [name] MRN: [id number] DOB: 11-11-1951 Today's Date: 07/26/2017    History of Present Illness pt is a 66 y/o female with pmh significant for DM , Seizure d/o HTN, h/o brain aneurysm, cervical CA, metastatic mucinous adenocarcinoma of the appendix, admitted from SNF with c/o bloody bowel movements.  In hospital pt appeared to have seizure resulting in encephalopathy.  CT/MRI's suggest L thalamic, L cerebellar and right frontal lobe encephalomalacia    PT Comments    Pt admitted with above diagnosis. Pt currently with functional limitations due to strength, balance and endurance deficits. Pt performed exercises on all 4 extremities.  She was active in assisting with exercises.  Will continue to follow acutely.  Pt will benefit from skilled PT to increase their independence and safety with mobility to allow discharge to the venue listed below.     Follow Up Recommendations  SNF;Supervision/Assistance - 24 hour     Equipment Recommendations  Wheelchair (measurements PT)    Recommendations for Other Services       Precautions / Restrictions Precautions Precautions: Fall Precaution Comments: rectal tube, foley Restrictions Weight Bearing Restrictions: No    Mobility  Bed Mobility                  Transfers                 General transfer comment: unable  Ambulation/Gait             General Gait Details: unable   Stairs            Wheelchair Mobility    Modified Rankin (Stroke Patients Only)       Balance                                            Cognition Arousal/Alertness: Awake/alert Behavior During Therapy: Flat affect Overall Cognitive Status: Impaired/Different from baseline Area of Impairment: Attention;Following commands;Safety/judgement;Awareness;Problem solving                   Current Attention Level: Focused (rarely sustained)   Following  Commands: Follows one step commands inconsistently;Follows one step commands with increased time Safety/Judgement: Decreased awareness of deficits;Decreased awareness of safety Awareness: Intellectual Problem Solving: Slow processing;Requires verbal cues;Requires tactile cues General Comments: patient pleasent and interactive but flat throughout session.       Exercises General Exercises - Upper Extremity Shoulder Flexion: AAROM;Right;Left;10 reps;Supine Shoulder ABduction: AAROM;Both;10 reps;Supine Elbow Flexion: AROM;Both;10 reps;Supine Elbow Extension: AROM;Both;5 reps;Supine General Exercises - Lower Extremity Ankle Circles/Pumps: AROM;10 reps;Supine;Both Quad Sets: AROM;Both;5 reps;Supine Heel Slides: AAROM;Both;10 reps;Supine Hip ABduction/ADduction: AAROM;Both;10 reps;Supine    General Comments        Pertinent Vitals/Pain Pain Assessment: No/denies pain    Home Living                      Prior Function            PT Goals (current goals can now be found in the care plan section) Acute Rehab PT Goals Patient Stated Goal: 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 : Unable Difficulty sitting down on and standing up from a chair with arms (e.g., wheelchair, bedside commode, etc,.)?: Unable Help needed moving to and from a bed to chair (including a wheelchair)?: Total Help needed walking in hospital room?: Total Help needed climbing 3-5 steps with a railing? : Total 6 Click Score: 6    End of Session   Activity Tolerance: Patient limited by  fatigue Patient left: in bed;with call bell/phone within reach Nurse Communication: Mobility status;Need for lift equipment PT Visit Diagnosis: Other abnormalities of gait and mobility (R26.89);Other symptoms and signs involving the nervous system (R29.898);Muscle weakness (generalized) (M62.81) Pain - Right/Left: Left Pain - part of body:  (abdomen)     Time: 8295-6213 PT Time Calculation (min) (ACUTE ONLY): 12 min  Charges:  $Therapeutic Exercise: 8-22 mins                    G Codes:       [place] [facility] [phone] (pager)    Denice Paradise 07/26/2017, 10:44 AM

## 2017-07-26 NOTE — Progress Notes (Signed)
Nutrition Follow-up  DOCUMENTATION CODES:   Obesity unspecified  INTERVENTION:   -Boost Breeze po TID, each supplement provides 250 kcal and 9 grams of protein -D/c liquid MVI -MVI daily -RD will follow for diet advancement and adjust supplement regimen was appropriate  NUTRITION DIAGNOSIS:   Inadequate oral intake related to altered GI function as evidenced by NPO status.  Ongoing  GOAL:   Patient will meet greater than or equal to 90% of their needs  Progressing  MONITOR:   Diet advancement, Labs, Weight trends, Skin, I & O's  REASON FOR ASSESSMENT:   Consult Assessment of nutrition requirement/status  ASSESSMENT:   Lori [name] is a 66 y.o. female with medical history significant of DM2, Seizures (not on medication), HTN, HLD, GERD, cervical and appendiceal carcinoma, intracranial aneurysm repair in 1997 who presents for rectal bleeding.  8/18- d/c to SNF cancelled d/t rapid response, seizure, CT revealed large ischemic bowel; cortrak tube removed, NGT placed for decompression 8/21- NGT removed, pt allowed ice chips 8/23- advanced to clear liquid diet  Case discussed with RN, who reports pt I much more alert today; telling stories about family members. Plan to advance to solid foods diet tomorrow and discharge to SNF once medically stable.   Palliative care team continues to follow; plan for family meeting tomorrow 07/27/17.  Labs reviewed: CBGS: 86-127, K: 3.2 (on IV supplementation).  Diet Order:  Diet - low sodium heart healthy Diet Carb Modified Diet clear liquid Room service appropriate? Yes; Fluid consistency: Thin  Skin:  Wound (see comment) (closed abdominal incision, stage 2 lt buttocks x2)  Last BM:  07/26/17 (700 ml output via rectal pouch)  Height:   Ht Readings from Last 1 Encounters:  07/01/17 5\' 8"  (1.727 m)    Weight:   Wt Readings from Last 1 Encounters:  07/26/17 208 lb 8 oz (94.6 kg)    Ideal Body Weight:  65.9 kg  BMI:   Body mass index is 31.7 kg/m.  Estimated Nutritional Needs:   Kcal:  1900-2100  Protein:  115-130 grams  Fluid:  > 1.9 L  EDUCATION NEEDS:   Education needs no appropriate at this time  Joshuah Minella A. Jimmye Norman, RD, LDN, CDE Pager: [phone] After hours Pager: [phone]

## 2017-07-26 NOTE — NC FL2 (Signed)
[place] LEVEL OF CARE SCREENING TOOL     IDENTIFICATION  Patient Name: Lori [name] Birthdate: [birth year] Sex: female Admission Date (Current Location): 06/23/2017  [facility] and [place] Number:  [employer] and Address:  [facility], [place] [address] [place], [place] [address]      Provider Number: [id number]  Attending Physician Name and Address:  Cristal Ford, DO  Relative Name and Phone Number:       Current Level of Care: Hospital Recommended Level of Care: [place] Prior Approval Number:    Date Approved/Denied:   PASRR Number: [id number]  Discharge Plan: SNF    Current Diagnoses: Patient Active Problem List   Diagnosis Date Noted  . Hypotension   . Altered mental status   . Pressure injury of skin 07/18/2017  . Adult failure to thrive   . Intra-abdominal fluid collection   . Cerebrovascular accident (CVA) due to thrombosis of precerebral artery ([place])   . Metastatic cancer ([place])   . Encephalopathy   . DNR (do not resuscitate) discussion   . Advance care planning   . Palliative care by specialist   . Intra-abdominal abscess ([place]) 06/23/2017  . Gastrointestinal hemorrhage with melena   . Hypokalemia   . Severe sepsis with septic shock ([place])   . Peritoneal carcinomatosis ([place])   . Seizure ([place])   . SBO (small bowel obstruction) ([place]) 05/15/2017  . HTN (hypertension) 05/15/2017  . Pulmonary nodule 03/26/2017  . 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/24/2015    Orientation RESPIRATION BLADDER Height & Weight     Self, Time, Place  Normal Continent, Indwelling catheter Weight: 208 lb 8 oz (94.6 kg) Height:  5\' 8"  (172.7 cm)  BEHAVIORAL SYMPTOMS/MOOD NEUROLOGICAL BOWEL NUTRITION STATUS   (None) Convulsions/Seizures (CVA) Incontinent (Flexiseal) Diet  (Starting clear liquid diet today. Will advance diet tomorrow if she does well.)  AMBULATORY STATUS COMMUNICATION OF NEEDS Skin   Extensive Assist Verbally Other (Comment), PU Stage and Appropriate Care, Surgical wounds (MASD, Rash.)   PU Stage 2 Dressing:  (Left medial buttocks: Foam prn. Left lower buttocks: Foam prn.)                   Personal Care Assistance Level of Assistance  Bathing, Feeding, Dressing Bathing Assistance: Maximum assistance Feeding assistance: Maximum assistance Dressing Assistance: Maximum assistance     Functional Limitations Info  Sight, Hearing, Speech Sight Info: Adequate Hearing Info: Adequate Speech Info: Adequate    SPECIAL CARE FACTORS FREQUENCY  PT (By licensed PT), Blood pressure, OT (By licensed OT), Speech therapy     PT Frequency: 5 x week OT Frequency: 5 x week     Speech Therapy Frequency: 5 x week      Contractures Contractures Info: Not present    Additional Factors Info  Code Status, Allergies Code Status Info: Full Allergies Info: Contrast Media (Iodinated Diagnostic Agents), Dilantin (Phenytoin Sodium Extended), Latex, Adhesive (Tape)   Insulin Sliding Scale Info: 3x daily with meals and at bedtime       Current Medications (07/26/2017):  This is the current hospital active medication list Current Facility-Administered Medications  Medication Dose Route Frequency Provider Last Rate Last Dose  . 0.9 %  sodium chloride infusion   Intravenous Once Alekh, Kshitiz, MD      . acetaminophen (TYLENOL) solution 650 mg  650 mg  Oral Q6H PRN Aline August, MD   650 mg at 07/22/17 2133  . dextrose 5 % with KCl 20 mEq / L  infusion  20 mEq Intravenous Continuous Alekh, Kshitiz, MD 75 mL/hr at 07/26/17 1131 20 mEq at 07/26/17 1131  . folic acid (FOLVITE) tablet 1 mg  1 mg Per Tube Daily Rai, Ripudeep K, MD   1 mg at 07/26/17 0959  . hydrALAZINE (APRESOLINE) injection 10 mg  10 mg Intravenous Q6H PRN Bonnielee Haff, MD   10 mg at  07/26/17 0325  . insulin aspart (novoLOG) injection 0-15 Units  0-15 Units Subcutaneous TID WC Mikhail, Velta Addison, DO   Stopped at 07/24/17 0800  . insulin aspart (novoLOG) injection 0-5 Units  0-5 Units Subcutaneous QHS Cristal Ford, DO   Stopped at 07/20/17 2200  . lacosamide (VIMPAT) 100 mg in sodium chloride 0.9 % 25 mL IVPB  100 mg Intravenous Q12H Aroor, Karena Addison R, MD 70 mL/hr at 07/26/17 1131 100 mg at 07/26/17 1131  . levETIRAcetam (KEPPRA) 1,000 mg in sodium chloride 0.9 % 100 mL IVPB  1,000 mg Intravenous Q12H Aline August, MD   Stopped at 07/26/17 1443  . loperamide (IMODIUM) capsule 2 mg  2 mg Oral PRN Aline August, MD   2 mg at 07/21/17 1124  . LORazepam (ATIVAN) injection 1 mg  1 mg Intravenous Q4H PRN Aline August, MD   1 mg at 07/25/17 0547  . menthol-cetylpyridinium (CEPACOL) lozenge 3 mg  1 lozenge Oral PRN Opyd, Ilene Qua, MD   3 mg at 07/24/17 1710  . mirtazapine (REMERON) tablet 7.5 mg  7.5 mg Oral QHS Mikhail, [name], DO   7.5 mg at 07/24/17 2237  . multivitamin liquid 15 mL  15 mL Per Tube Daily Mikhail, Maryann, DO   15 mL at 07/26/17 1000  . ondansetron (ZOFRAN) tablet 4 mg  4 mg Oral Q6H PRN Sid Falcon, MD       Or  . ondansetron [facility]) injection 4 mg  4 mg Intravenous Q6H PRN Gilles Chiquito B, MD      . piperacillin-tazobactam (ZOSYN) IVPB 3.375 g  3.375 g Intravenous Q8H Aline August, MD   Stopped at 07/26/17 0957  . RESOURCE THICKENUP CLEAR   Oral PRN Rai, Ripudeep K, MD      . sodium chloride flush (NS) 0.9 % injection 10-40 mL  10-40 mL Intracatheter PRN Nita Sells, MD   10 mL at 07/21/17 0526   Facility-Administered Medications Ordered in Other Encounters  Medication Dose Route Frequency Provider Last Rate Last Dose  . sodium chloride flush (NS) 0.9 % injection 10 mL  10 mL Intravenous PRN Baird Cancer, PA-C   10 mL at 03/26/17 1050     Discharge Medications: Please see discharge summary for a list of discharge  medications.  Relevant Imaging Results:  Relevant Lab Results:   Additional Information SS#: [id number]  Candie Chroman, LCSW

## 2017-07-26 NOTE — Progress Notes (Signed)
PROGRESS NOTE    Lori [name]  WCB:[id number] DOB: 1951/01/25 DOA: 06/23/2017 PCP: [name], MD   Chief Complaint  Patient presents with  . 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 She was at a skilled nursing facility. She was recently hospitalized for small bowel obstruction and underwent diagnostic laparoscopy in June. She underwent repair of small bowel perforation. She had a prolonged hospital stay. Subsequently was discharged to skilled nursing facility. Presented back with rectal bleeding. Seen by oncology. Not thought to be a candidate for any treatment. Then she had what appears to be a seizure resulting in encephalopathy. Neurology was consulted. Palliative medicine continues to follow. Family still desires aggressive care. Patient was transferred to stepdown unit. Critical care medicine was consulted. Patient noted to have profuse urine output. 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 Patient improving. Had MBS, started on dysphagia 2 diet however still poor oral intake, therefore NGT still in place. Patient transitioned to oral Keppra, Tegretol. Mental status improving. Neurology felt no need for lumbar puncture as mental status has been improving.  Assessment & Plan   Severe sepsis secondary to pneumatosis and bowel ischemia -Placed on vancomycin and Zosyn initially however vancomycin was discontinued as there is no evidence of MRSA -Patient still not candidate for surgery per general surgery -Blood cultures show no growth to date continue IV fluids -Surgery recommended medical management -NG tube removed -Leukocytosis improved, down to 10.7 (from 15)  Breakthrough seizure/history of seizure disorder -Neurology  consulted and appreciated -Patient placed on IV Keppra and Vimpat -CT brain unremarkable -Tegretol held -Continue seizure precautions  Acute metabolic encephalopathy/Left Thalamic Stroke/ Bilateral small SDH -Patient was noted to have garbled speech on the afternoon of 06/28/2017 -Neurology consultation appreciated -CT head on 8/1: Evolving encephalomalacia at suspected left thalamic lacunar infarct. Previously identified tiny bilateral subdural hematoma -MRI brain on 7/29: New tiny bilateral subdural hematomas, trace subarachnoid hemorrhage, punctate focus of diffusion abnormality in the dorsal thalamus -It was thought the patient was having seizure activity and she was loaded with Keppra -Patient has had continuous EEG monitoring: Progressively improving diffuse encephalopathy  -Neurology feels lumbar puncture is no longer necessary given that her mental status is improving -Patient had modified barium swallow, was placed on dysphagia 2 diet, however was made NPO due to seizure  -Transitioned to clear liquids today -Mental status appears to have improved   Diabetes insipidus/polyuria/Hypernatremia -Patient had excessive urine output thought to be due to central diabetes insipidus due to abnormalities noted on MRI of the brain -Resolved however patient was then placed on IV fluids after seizure episode. -Sodium down to 148, pending BMP today -Continue D5 -Continue to monitor urine output, over the past 24 hours 1700cc  Primary appendiceal carcinoma with metastatic disease, stage IV with abdominal wound -Oncology consultation appreciated, patient is not a candidate for chemotherapy and is thought to have an incurable condition. Poor prognosis -Palliative care was consulted. 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 surgery consulted and following- recommended TID wet to dry dressing  changes -Of note, patient completed course of IV Unasyn which was used to treat urinary tract infection and covered her abdominal abscess  Rectal bleeding of unknown cause -Appears to be stable, patient is not  a candidate for endoscopic evaluation due to recent surgery. This was discussed by previous hospitalist with general surgery.  Anemia secondary to acute blood loss -Hemoglobin currently 7.8, appears stable -transfuse if hemoglobin <7 -Continue to monitor CBC  Diabetes mellitus, type II -Continue insulin sliding scale CBG monitoring  Essential hypertension -Continue clonidine, hydralazine as needed  Enterococcus UTI -resolved, treated with unasyn  Nutrition -Continues to be encephalopathic with poor oral intake -Was seen by nutrition/speech therapy and placed on dysphagia diet however patient did have a seizure -NG tube was removed -Transitioned to clear liquid diet by surgery today -Continue D5 and remeron  -Likely not a good candidate for PEG tube given her carcinomatosis  Hypokalemia/hypomagnesemia -Pending BMP today -Will replace as needed  Stage II pressure ulcer of the buttocks -Continue wound care  Goals of care -Palliative care was consulted and appreciated however family wishes to continue aggressive care -Feel patient has an overall poor prognosis given her poor oral intake, seizure activity, multiple comorbidities, stage IV appendiceal cancer  DVT Prophylaxis  SCDs  Code Status: Full  Family Communication: None at bedside  Disposition Plan: Admitted. Dispo TBD  Consultants PCCM General surgery Neurology  Oncology Palliative care  Procedures  Continuous EEG  Antibiotics   Anti-infectives    Start     Dose/Rate Route Frequency Ordered Stop   07/22/17 0500  vancomycin (VANCOCIN) IVPB 1000 mg/200 mL premix  Status:  Discontinued     1,000 mg 200 mL/hr over 60 Minutes Intravenous Every 12 hours 07/21/17 1545 07/24/17 1047   07/21/17 2200   piperacillin-tazobactam (ZOSYN) IVPB 3.375 g     3.375 g 12.5 mL/hr over 240 Minutes Intravenous Every 8 hours 07/21/17 1545     07/21/17 1630  vancomycin (VANCOCIN) 1,500 mg in sodium chloride 0.9 % 500 mL IVPB     1,500 mg 250 mL/hr over 120 Minutes Intravenous  Once 07/21/17 1545 07/22/17 0000   07/21/17 1500  piperacillin-tazobactam (ZOSYN) IVPB 3.375 g     3.375 g 100 mL/hr over 30 Minutes Intravenous  Once 07/21/17 1449 07/21/17 1638   07/04/17 1100  Ampicillin-Sulbactam (UNASYN) 3 g in sodium chloride 0.9 % 100 mL IVPB     3 g 200 mL/hr over 30 Minutes Intravenous Every 6 hours 07/04/17 0823 07/10/17 0429   07/03/17 1600  Ampicillin-Sulbactam (UNASYN) 3 g in sodium chloride 0.9 % 100 mL IVPB  Status:  Discontinued     3 g 200 mL/hr over 30 Minutes Intravenous Every 6 hours 07/03/17 1047 07/04/17 0823   06/29/17 1400  ceFEPIme (MAXIPIME) 2 g in dextrose 5 % 50 mL IVPB  Status:  Discontinued     2 g 100 mL/hr over 30 Minutes Intravenous Every 8 hours 06/29/17 0853 06/29/17 0857   06/29/17 1000  ceFEPIme (MAXIPIME) 2 g in dextrose 5 % 50 mL IVPB  Status:  Discontinued     2 g 100 mL/hr over 30 Minutes Intravenous Every 8 hours 06/29/17 0857 07/03/17 1046   06/29/17 0930  metroNIDAZOLE (FLAGYL) IVPB 500 mg  Status:  Discontinued     500 mg 100 mL/hr over 60 Minutes Intravenous Every 8 hours 06/29/17 0838 06/30/17 0956   06/24/17 0600  piperacillin-tazobactam (ZOSYN) IVPB 3.375 g  Status:  Discontinued     3.375 g 12.5 mL/hr over 240 Minutes Intravenous Every 8 hours 06/23/17 2254 06/29/17 0838   06/23/17 2300  piperacillin-tazobactam (ZOSYN) IVPB 3.375 g     3.375 g 100 mL/hr over 30 Minutes Intravenous  Once 06/23/17 2254 06/24/17 0056      Subjective:   [name] seen and examined today. No complaints today.  Not very interactive. Denies abdominal pain.   Objective:   Vitals:   07/26/17 0325 07/26/17 0400 07/26/17 0744 07/26/17 0800  BP: (!) 175/99 (!) 154/91 (!)  171/94 (!) 158/92  Pulse:  (!) 109 99 100  Resp:  (!) 27 (!) 21 (!) 21  Temp:  98 F (36.7 C) 98.1 F (36.7 C)   TempSrc:  Oral Oral   SpO2:  100% 100% 98%  Weight:  94.6 kg (208 lb 8 oz)    Height:        Intake/Output Summary (Last 24 hours) at 07/26/17 1130 Last data filed at 07/26/17 0300  Gross per 24 hour  Intake           3377.5 ml  Output             1250 ml  Net           2127.5 ml   Filed Weights   07/24/17 0346 07/25/17 0403 07/26/17 0400  Weight: 94.8 kg (209 lb) 94.3 kg (207 lb 14.3 oz) 94.6 kg (208 lb 8 oz)   Exam  General: Well developed, ill appearing, no distress  HEENT: NCAT, mucous membranes moist.   Cardiovascular: S1 S2 auscultated, RRR  Respiratory: Clear to auscultation bilaterally  Abdomen: Soft, nontender, nondistended, + bowel sounds, dressing in place  Extremities: warm dry without cyanosis clubbing or edema  Neuro: AAOx3, nonfocal  Data Reviewed: I have personally reviewed following labs and imaging studies  CBC:  Recent Labs Lab 07/21/17 1800 07/22/17 0356 07/23/17 0625 07/24/17 0500 07/25/17 1520 07/26/17 0242  WBC 12.3* 11.5* 15.0* 15.1* 11.0* 10.7*  NEUTROABS 10.0* 9.8* 13.1* 13.3* 9.0*  --   HGB 8.1* 7.0* 6.6* 7.2* 8.0* 7.8*  HCT 26.9* 23.9* 22.2* 23.9* 25.7* 25.9*  MCV 91.2 90.2 88.8 88.2 88.3 87.2  PLT 465* 388 388 407* 443* 025*   Basic Metabolic Panel:  Recent Labs Lab 07/21/17 0514 07/21/17 1800 07/22/17 0356 07/23/17 0625 07/24/17 0500 07/25/17 1520  NA 142 144 146* 149* 151* 148*  K 3.4* 3.0* 3.3* 3.7 3.0* 2.4*  CL 104 110 115* 121* 122* 120*  CO2 26 23 22 22 24 23   GLUCOSE 222* 156* 120* 81 132* 110*  BUN 38* 46* 47* 33* 19 9  CREATININE 0.80 1.26* 1.06* 0.76 0.63 0.52  CALCIUM 8.4* 7.8* 7.4* 7.5* 7.7* 7.4*  MG 2.1  --  1.9 1.8 1.7 1.4*   GFR: Estimated Creatinine Clearance: 83.2 mL/min (by C-G formula based on SCr of 0.52 mg/dL). Liver Function Tests:  Recent Labs Lab 07/21/17 1800  07/23/17 0625 07/24/17 0500 07/25/17 1520  AST 28 19 16 16   ALT 12* 13* 11* 11*  ALKPHOS 150* 119 118 98  BILITOT 0.5 0.5 0.8 0.4  PROT 5.4* 5.0* 5.0* 4.9*  ALBUMIN 1.4* 1.2* 1.1* 1.2*   No results for input(s): LIPASE, AMYLASE in the last 168 hours. No results for input(s): AMMONIA in the last 168 hours. Coagulation Profile:  Recent Labs Lab 07/21/17 1800  INR 1.66   Cardiac Enzymes: No results for input(s): CKTOTAL, CKMB, CKMBINDEX, TROPONINI in the last 168 hours. BNP (last 3 results) No results for input(s): PROBNP in the last 8760 hours. HbA1C: No results for input(s): HGBA1C in the last 72 hours. CBG:  Recent Labs Lab 07/25/17 0728 07/25/17 1152 07/25/17 1711 07/25/17 2257 07/26/17 0741  GLUCAP  100* 98 107* 86 105*   Lipid Profile: No results for input(s): CHOL, HDL, LDLCALC, TRIG, CHOLHDL, LDLDIRECT in the last 72 hours. Thyroid Function Tests: No results for input(s): TSH, T4TOTAL, FREET4, T3FREE, THYROIDAB in the last 72 hours. Anemia Panel: No results for input(s): VITAMINB12, FOLATE, FERRITIN, TIBC, IRON, RETICCTPCT in the last 72 hours. Urine analysis:    Component Value Date/Time   COLORURINE AMBER (A) 07/21/2017 1552   APPEARANCEUR HAZY (A) 07/21/2017 1552   LABSPEC 1.024 07/21/2017 1552   LABSPEC 1.010 08/17/2015 1542   PHURINE 5.0 07/21/2017 1552   GLUCOSEU NEGATIVE 07/21/2017 1552   GLUCOSEU 100 08/17/2015 1542   HGBUR SMALL (A) 07/21/2017 1552   BILIRUBINUR NEGATIVE 07/21/2017 1552   BILIRUBINUR Negative 08/17/2015 1542   KETONESUR NEGATIVE 07/21/2017 1552   PROTEINUR 30 (A) 07/21/2017 1552   UROBILINOGEN 1.0 08/26/2015 0725   UROBILINOGEN 0.2 08/17/2015 1542   NITRITE NEGATIVE 07/21/2017 1552   LEUKOCYTESUR SMALL (A) 07/21/2017 1552   LEUKOCYTESUR Trace 08/17/2015 1542   Sepsis Labs: @LABRCNTIP (procalcitonin:4,lacticidven:4)  ) Recent Results (from the past 240 hour(s))  C difficile quick scan w PCR reflex     Status: None    Collection Time: 07/20/17 12:40 PM  Result Value Ref Range Status   C Diff antigen NEGATIVE NEGATIVE Final   C Diff toxin NEGATIVE NEGATIVE Final   C Diff interpretation No C. difficile detected.  Final  Gastrointestinal Panel by PCR , Stool     Status: None   Collection Time: 07/20/17 12:40 PM  Result Value Ref Range Status   Campylobacter species NOT DETECTED NOT DETECTED Final   Plesimonas shigelloides NOT DETECTED NOT DETECTED Final   Salmonella species NOT DETECTED NOT DETECTED Final   Yersinia enterocolitica NOT DETECTED NOT DETECTED Final   Vibrio species NOT DETECTED NOT DETECTED Final   Vibrio cholerae NOT DETECTED NOT DETECTED Final   Enteroaggregative E coli (EAEC) NOT DETECTED NOT DETECTED Final   Enteropathogenic E coli (EPEC) NOT DETECTED NOT DETECTED Final   Enterotoxigenic E coli (ETEC) NOT DETECTED NOT DETECTED Final   Shiga like toxin producing E coli (STEC) NOT DETECTED NOT DETECTED Final   Shigella/Enteroinvasive E coli (EIEC) NOT DETECTED NOT DETECTED Final   Cryptosporidium NOT DETECTED NOT DETECTED Final   Cyclospora cayetanensis NOT DETECTED NOT DETECTED Final   Entamoeba histolytica NOT DETECTED NOT DETECTED Final   Giardia lamblia NOT DETECTED NOT DETECTED Final   Adenovirus F40/41 NOT DETECTED NOT DETECTED Final   Astrovirus NOT DETECTED NOT DETECTED Final   Norovirus GI/GII NOT DETECTED NOT DETECTED Final   Rotavirus A NOT DETECTED NOT DETECTED Final   Sapovirus (I, II, IV, and V) NOT DETECTED NOT DETECTED Final  Culture, Urine     Status: Abnormal   Collection Time: 07/21/17  3:51 PM  Result Value Ref Range Status   Specimen Description URINE, CATHETERIZED  Final   Special Requests NONE  Final   Culture MULTIPLE SPECIES PRESENT, SUGGEST RECOLLECTION (A)  Final   Report Status 07/23/2017 FINAL  Final  Culture, blood (x 2)     Status: None   Collection Time: 07/21/17  4:25 PM  Result Value Ref Range Status   Specimen Description BLOOD RIGHT HAND  Final    Special Requests   Final    BOTTLES DRAWN AEROBIC ONLY Blood Culture results may not be optimal due to an inadequate volume of blood received in culture bottles   Culture NO GROWTH 5 DAYS  Final   Report Status  07/26/2017 FINAL  Final  Culture, blood (x 2)     Status: None   Collection Time: 07/21/17  6:00 PM  Result Value Ref Range Status   Specimen Description BLOOD RIGHT ARM  Final   Special Requests   Final    BOTTLES DRAWN AEROBIC AND ANAEROBIC Blood Culture adequate volume   Culture NO GROWTH 5 DAYS  Final   Report Status 07/26/2017 FINAL  Final      Radiology Studies: Dg Chest Port 1 View  Result Date: 07/25/2017 CLINICAL DATA:  Back pain history of appendiceal adenocarcinoma and peritoneal carcinomatosis. Laparotomy for intra-abdominal abscess drainage on May 30, 2017, previous CVA, cervical malignancy, former smoker. EXAM: PORTABLE CHEST 1 VIEW COMPARISON:  Portable chest x-ray of July 23, 2017 FINDINGS: The right lung is mildly hypoinflated with the left lung better inflated. There remains increased interstitial density in the right mid and lower lung. There is no large pleural effusion. The cardiac silhouette is enlarged. The pulmonary vascularity is not engorged. There is calcification in the wall of the aortic arch. The Port-A-Cath tip projects over the distal third of the SVC. The nasogastric tube has been removed. IMPRESSION: Fairly stable appearance of the chest. Right basilar atelectasis or pneumonia. Mild cardiomegaly and central pulmonary vascular congestion without definite pulmonary edema. Electronically Signed   By: David  Martinique M.D.   On: 07/25/2017 07:50     Scheduled Meds: . folic acid  1 mg Per Tube Daily  . insulin aspart  0-15 Units Subcutaneous TID WC  . insulin aspart  0-5 Units Subcutaneous QHS  . mirtazapine  7.5 mg Oral QHS  . multivitamin  15 mL Per Tube Daily   Continuous Infusions: . sodium chloride    . dextrose 5 % with KCl 20 mEq / L 20 mEq  (07/26/17 0300)  . lacosamide (VIMPAT) IV Stopped (07/25/17 2239)  . levETIRAcetam Stopped (07/26/17 0855)  . piperacillin-tazobactam (ZOSYN)  IV Stopped (07/26/17 0957)     LOS: 33 days   Time Spent in minutes   30 minutes  Travin Marik D.O. on 07/26/2017 at 11:30 AM  Between 7am to 7pm - Pager - [phone]  After 7pm go to [email or web address] - password [email or web address]  And look for the night coverage person covering for me after hours  [facility] Office  [phone]

## 2017-07-26 NOTE — Progress Notes (Signed)
[facility] Progress Note     Subjective: CC:  Alert, oriented, denies abdominal pain.   Objective: Vital signs in last 24 hours: Temp:  [97.7 F (36.5 C)-98.5 F (36.9 C)] 98.1 F (36.7 C) (08/23 0744) Pulse Rate:  [99-119] 100 (08/23 0800) Resp:  [21-32] 21 (08/23 0800) BP: (133-185)/(91-114) 158/92 (08/23 0800) SpO2:  [97 %-100 %] 98 % (08/23 0800) Weight:  [94.6 kg (208 lb 8 oz)] 94.6 kg (208 lb 8 oz) (08/23 0400) Last BM Date: 07/23/17  Intake/Output from previous day: 08/22 0701 - 08/23 0700 In: 3377.5 [P.O.:240; I.V.:3137.5] Out: 2400 [Urine:1700; Stool:700] Intake/Output this shift: No intake/output data recorded.  PE: Gen:  somenolent, NAD, cooperative CV: RRR Pulm:  Normal effort, clear to auscultation bilaterally Abd: Soft, non-tender, non-distended, hypoactive but present BS, midline incision - wound base is pink with some yellow slough over right aspect of wound base. No active drainage.  Skin: warm and dry, no rashes  Psych: A&Ox3   Lab Results:   Recent Labs  07/25/17 1520 07/26/17 0242  WBC 11.0* 10.7*  HGB 8.0* 7.8*  HCT 25.7* 25.9*  PLT 443* 439*   BMET  Recent Labs  07/24/17 0500 07/25/17 1520  NA 151* 148*  K 3.0* 2.4*  CL 122* 120*  CO2 24 23  GLUCOSE 132* 110*  BUN 19 9  CREATININE 0.63 0.52  CALCIUM 7.7* 7.4*   PT/INR No results for input(s): LABPROT, INR in the last 72 hours. CMP     Component Value Date/Time   NA 148 (H) 07/25/2017 1520   K 2.4 (LL) 07/25/2017 1520   CL 120 (H) 07/25/2017 1520   CO2 23 07/25/2017 1520   GLUCOSE 110 (H) 07/25/2017 1520   BUN 9 07/25/2017 1520   CREATININE 0.52 07/25/2017 1520   CALCIUM 7.4 (L) 07/25/2017 1520   PROT 4.9 (L) 07/25/2017 1520   ALBUMIN 1.2 (L) 07/25/2017 1520   AST 16 07/25/2017 1520   ALT 11 (L) 07/25/2017 1520   ALKPHOS 98 07/25/2017 1520   BILITOT 0.4 07/25/2017 1520   GFRNONAA >60 07/25/2017 1520   GFRAA >60 07/25/2017 1520   Lipase      Component Value Date/Time   LIPASE 31 05/14/2017 2106       Studies/Results: Dg Chest Port 1 View  Result Date: 07/25/2017 CLINICAL DATA:  Back pain history of appendiceal adenocarcinoma and peritoneal carcinomatosis. Laparotomy for intra-abdominal abscess drainage on May 30, 2017, previous CVA, cervical malignancy, former smoker. EXAM: PORTABLE CHEST 1 VIEW COMPARISON:  Portable chest x-ray of July 23, 2017 FINDINGS: The right lung is mildly hypoinflated with the left lung better inflated. There remains increased interstitial density in the right mid and lower lung. There is no large pleural effusion. The cardiac silhouette is enlarged. The pulmonary vascularity is not engorged. There is calcification in the wall of the aortic arch. The Port-A-Cath tip projects over the distal third of the SVC. The nasogastric tube has been removed. IMPRESSION: Fairly stable appearance of the chest. Right basilar atelectasis or pneumonia. Mild cardiomegaly and central pulmonary vascular congestion without definite pulmonary edema. Electronically Signed   By: David  Martinique M.D.   On: 07/25/2017 07:50    Anti-infectives: Anti-infectives    Start     Dose/Rate Route Frequency Ordered Stop   07/22/17 0500  vancomycin (VANCOCIN) IVPB 1000 mg/200 mL premix  Status:  Discontinued     1,000 mg 200 mL/hr over 60 Minutes Intravenous Every 12 hours 07/21/17 1545 07/24/17 1047  07/21/17 2200  piperacillin-tazobactam (ZOSYN) IVPB 3.375 g     3.375 g 12.5 mL/hr over 240 Minutes Intravenous Every 8 hours 07/21/17 1545     07/21/17 1630  vancomycin (VANCOCIN) 1,500 mg in sodium chloride 0.9 % 500 mL IVPB     1,500 mg 250 mL/hr over 120 Minutes Intravenous  Once 07/21/17 1545 07/22/17 0000   07/21/17 1500  piperacillin-tazobactam (ZOSYN) IVPB 3.375 g     3.375 g 100 mL/hr over 30 Minutes Intravenous  Once 07/21/17 1449 07/21/17 1638   07/04/17 1100  Ampicillin-Sulbactam (UNASYN) 3 g in sodium chloride 0.9 % 100 mL  IVPB     3 g 200 mL/hr over 30 Minutes Intravenous Every 6 hours 07/04/17 0823 07/10/17 0429   07/03/17 1600  Ampicillin-Sulbactam (UNASYN) 3 g in sodium chloride 0.9 % 100 mL IVPB  Status:  Discontinued     3 g 200 mL/hr over 30 Minutes Intravenous Every 6 hours 07/03/17 1047 07/04/17 0823   06/29/17 1400  ceFEPIme (MAXIPIME) 2 g in dextrose 5 % 50 mL IVPB  Status:  Discontinued     2 g 100 mL/hr over 30 Minutes Intravenous Every 8 hours 06/29/17 0853 06/29/17 0857   06/29/17 1000  ceFEPIme (MAXIPIME) 2 g in dextrose 5 % 50 mL IVPB  Status:  Discontinued     2 g 100 mL/hr over 30 Minutes Intravenous Every 8 hours 06/29/17 0857 07/03/17 1046   06/29/17 0930  metroNIDAZOLE (FLAGYL) IVPB 500 mg  Status:  Discontinued     500 mg 100 mL/hr over 60 Minutes Intravenous Every 8 hours 06/29/17 0838 06/30/17 0956   06/24/17 0600  piperacillin-tazobactam (ZOSYN) IVPB 3.375 g  Status:  Discontinued     3.375 g 12.5 mL/hr over 240 Minutes Intravenous Every 8 hours 06/23/17 2254 06/29/17 0838   06/23/17 2300  piperacillin-tazobactam (ZOSYN) IVPB 3.375 g     3.375 g 100 mL/hr over 30 Minutes Intravenous  Once 06/23/17 2254 06/24/17 0056     Assessment/Plan Primary appendiceal carcinoma with metastatic disease, stage IV  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 [name]: 1. 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  2. Exploratory laparotomy, drainage of abdominal abscess, repair of small bowel perforation, repair of small bowel enterotomy, enterocolostomy (mid ileum to mid transverse colon). - local wound care, can continue some debridement but I dont this this will likely ever heal at this point Likely small bowel ischemia, pneumatosis on ct  - Do not recommend surgical intervention. Stage 4 cancer is not amenable to surgical treatment and operative intervention for pneumatosis would  unlikely change patient outcome.  - Her exam is benign, WBC continues to trend down, continue abx, will advance diet to clear liquid tray.   LOS: 50 days    [facility] 07/26/2017, 9:22 AM Pager: [phone] Consults: [phone] Mon-Fri 7:00 am-4:30 pm Sat-Sun 7:00 am-11:30 am

## 2017-07-26 NOTE — Clinical Social Work Note (Signed)
CSW spoke with patient's son, Gasper Lloyd. CSW explained that now that patient's NG tube is out, it broadens their SNF options. His first preference is still [facility]. CSW spoke with admissions coordinator and notified her that NG tube is out. She stated they should be able to take her but would review updated information. MD entered OT order for evaluation for insurance authorization.  Dayton Scrape, [place]

## 2017-07-26 NOTE — Progress Notes (Signed)
Palliative Medicine RN Note: Attempting to set up family meeting for tomorrow with PMT provider and Dr Ree Kida. Called son; no answer. LM requesting call back.  Marjie Skiff Jerome Viglione, RN, BSN, [facility] 07/26/2017 1:52 PM Cell [phone] 8:00-4:00 Monday-Friday Office [phone]

## 2017-07-26 NOTE — Care Management Note (Signed)
Case Management Note  Patient Details  Name: Lori [name] MRN: [id number] Date of Birth: 06/08/1951  Subjective/Objective:     Pt admitted with Blood in Stools - pt found to have ischemic bowel                Action/Plan:   PTA from home.  Pt is being recommended for SNF - CSW following.     Expected Discharge Date:  07/21/17               Expected Discharge Plan:  Skilled Nursing Facility  In-House Referral:  Clinical Social Work  Discharge planning Services     Post Acute Care Choice:    Choice offered to:     DME Arranged:    DME Agency:     HH Arranged:    HH Agency:     Status of Service:  In process, will continue to follow  If discussed at Long Length of Stay Meetings, dates discussed:    Additional Comments: 07/26/2017 Pt transferred to SD from floor.  Pt was discussed in LOS 8/23 - pt remains appropriate for continued stay - barrier to discharge is nutriiton ; NG tube removed to allow for appetite encouragement however pt currently only tolerating ice chips.  Palliative involved - family continues to want aggressive measures. Maryclare Labrador, RN 07/26/2017, 9:01 AM

## 2017-07-27 LAB — CBC
HEMATOCRIT: 25.1 % — AB (ref 36.0–46.0)
Hemoglobin: 7.5 g/dL — ABNORMAL LOW (ref 12.0–15.0)
MCH: 26 pg (ref 26.0–34.0)
MCHC: 29.9 g/dL — AB (ref 30.0–36.0)
MCV: 86.9 fL (ref 78.0–100.0)
PLATELETS: 440 10*3/uL — AB (ref 150–400)
RBC: 2.89 MIL/uL — ABNORMAL LOW (ref 3.87–5.11)
RDW: 17.3 % — AB (ref 11.5–15.5)
WBC: 9.7 10*3/uL (ref 4.0–10.5)

## 2017-07-27 LAB — GLUCOSE, CAPILLARY
GLUCOSE-CAPILLARY: 134 mg/dL — AB (ref 65–99)
GLUCOSE-CAPILLARY: 124 mg/dL — AB (ref 65–99)
GLUCOSE-CAPILLARY: 101 mg/dL — AB (ref 65–99)
Glucose-Capillary: 113 mg/dL — ABNORMAL HIGH (ref 65–99)

## 2017-07-27 LAB — BASIC METABOLIC PANEL
ANION GAP: 8 (ref 5–15)
BUN: 6 mg/dL (ref 6–20)
CALCIUM: 7.6 mg/dL — AB (ref 8.9–10.3)
CO2: 22 mmol/L (ref 22–32)
Chloride: 113 mmol/L — ABNORMAL HIGH (ref 101–111)
Creatinine, Ser: 0.56 mg/dL (ref 0.44–1.00)
GFR calc Af Amer: >60 mL/min (ref >60)
GLUCOSE: 130 mg/dL — AB (ref 65–99)
Potassium: 3 mmol/L — ABNORMAL LOW (ref 3.5–5.1)
Sodium: 143 mmol/L (ref 135–145)

## 2017-07-27 LAB — MAGNESIUM: Magnesium: 1.6 mg/dL — ABNORMAL LOW (ref 1.7–2.4)

## 2017-07-27 MED ORDER — POTASSIUM CHLORIDE 10 MEQ/100ML IV SOLN
10.0000 meq | INTRAVENOUS | Status: AC
  Administered 2017-07-27 (×4): 10 meq via INTRAVENOUS
  Filled 2017-07-27 (×4): qty 100

## 2017-07-27 MED ORDER — GLUCERNA SHAKE PO LIQD
237.0000 mL | Freq: Three times a day (TID) | ORAL | Status: DC
  Administered 2017-07-27 – 2017-08-01 (×4): 237 mL via ORAL

## 2017-07-27 MED ORDER — MAGNESIUM SULFATE 2 GM/50ML IV SOLN
2.0000 g | Freq: Once | INTRAVENOUS | Status: AC
  Administered 2017-07-27: 2 g via INTRAVENOUS
  Filled 2017-07-27: qty 50

## 2017-07-27 MED ORDER — POTASSIUM CHLORIDE 20 MEQ/15ML (10%) PO SOLN
40.0000 meq | Freq: Every day | ORAL | Status: DC
  Administered 2017-07-27 – 2017-07-28 (×2): 40 meq via ORAL
  Filled 2017-07-27 (×2): qty 30

## 2017-07-27 NOTE — Progress Notes (Signed)
PROGRESS NOTE    Lori [name]  JEH:[id number] DOB: 11/10/1951 DOA: 06/23/2017 PCP: [name], MD   Chief Complaint  Patient presents with  . 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 She was at a skilled nursing facility. She was recently hospitalized for small bowel obstruction and underwent diagnostic laparoscopy in June. She underwent repair of small bowel perforation. She had a prolonged hospital stay. Subsequently was discharged to skilled nursing facility. Presented back with rectal bleeding. Seen by oncology. Not thought to be a candidate for any treatment. Then she had what appears to be a seizure resulting in encephalopathy. Neurology was consulted. Palliative medicine continues to follow. Family still desires aggressive care. Patient was transferred to stepdown unit. Critical care medicine was consulted. Patient noted to have profuse urine output. 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 Patient improving. Had MBS, started on dysphagia 2 diet however still poor oral intake, therefore NGT still in place. Patient transitioned to oral Keppra, Tegretol. Mental status improving. Neurology felt no need for lumbar puncture as mental status has been improving.  Assessment & Plan   Severe sepsis secondary to pneumatosis and bowel ischemia -Placed on vancomycin and Zosyn initially however vancomycin was discontinued as there is no evidence of MRSA -Patient still not candidate for surgery per general surgery -Blood cultures show no growth to date continue IV fluids -Surgery recommended medical management and continuing oral antibiotics for additional 5 days (when able to tolerate diet) -NG tube removed -Leukocytosis improved, down to  9.7 (from 15)  Breakthrough seizure/history of seizure disorder -Neurology consulted and appreciated -Patient placed on IV Keppra and Vimpat -CT brain unremarkable -Tegretol held -Continue seizure precautions  Acute metabolic encephalopathy/Left Thalamic Stroke/ Bilateral small SDH -Patient was noted to have garbled speech on the afternoon of 06/28/2017 -Neurology consultation appreciated -CT head on 8/1: Evolving encephalomalacia at suspected left thalamic lacunar infarct. Previously identified tiny bilateral subdural hematoma -MRI brain on 7/29: New tiny bilateral subdural hematomas, trace subarachnoid hemorrhage, punctate focus of diffusion abnormality in the dorsal thalamus -It was thought the patient was having seizure activity and she was loaded with Keppra -Patient has had continuous EEG monitoring: Progressively improving diffuse encephalopathy  -Neurology feels lumbar puncture is no longer necessary given that her mental status is improving -Patient had modified barium swallow, was placed on dysphagia 2 diet, however was made NPO due to seizure  -Mental status has improved, AAOx3 -tolerated clear liquids, and advanced to full liquids today  Diabetes insipidus/polyuria/Hypernatremia -Patient had excessive urine output thought to be due to central diabetes insipidus due to abnormalities noted on MRI of the brain -Resolved however patient was then placed on IV fluids after seizure episode. -Sodium down to 144 -Continue D5 -Continue to monitor urine output (no urine output charted over the past 24 hours- have discussed with RN/tech)  Primary appendiceal carcinoma with metastatic disease, stage IV with abdominal wound -Oncology consultation appreciated, patient is not a candidate for chemotherapy and is thought to have an incurable condition. Poor prognosis -Palliative care was consulted. -had recent SBO due to carcinomatosis status post laparotomy, drainage of intra-abdominal  abscess, repair of small bowel perforation -Gen. surgery consulted and following- recommended TID wet to dry dressing changes -Of note, patient completed course of IV Unasyn which was used to treat urinary tract  infection and covered her abdominal abscess  Rectal bleeding of unknown cause -Appears to be stable, patient is not a candidate for endoscopic evaluation due to recent surgery. This was discussed by previous hospitalist with general surgery.  Anemia secondary to acute blood loss -Hemoglobin currently 7.5, appears stable -patient was transfused 1uPRBC on 07/23/2017 -transfuse if hemoglobin <7 -Continue to monitor CBC  Diabetes mellitus, type II -Continue insulin sliding scale CBG monitoring  Essential hypertension -Stable, will continue clonidine, and PRN hydralazine  Enterococcus UTI -resolved, treated with unasyn  Nutrition -Continues to be encephalopathic with poor oral intake -Was seen by nutrition/speech therapy and placed on dysphagia diet however patient did have a seizure -NG tube was removed -Transitioned to clear liquid diet by surgery today -Continue D5 and remeron  -Likely not a good candidate for PEG tube given her carcinomatosis  Hypokalemia/hypomagnesemia -potassium 3, will supplement PO and IV for better absorption  -Magnesium 1.6, will replace with IV supplementation  -continue to monitor   Stage II pressure ulcer of the buttocks -Continue wound care  Goals of care -Palliative care was consulted and appreciated however family wishes to continue aggressive care -Feel patient has an overall poor prognosis given her poor oral intake, seizure activity, multiple comorbidities, stage IV appendiceal cancer -Trying to coordinate family meeting -Hopefully patient can tolerate full liquid diet today and can advance to soft diet on 8/25. If this is the case, will likely try to discharge patient to SNF within the next 2-3 days.   DVT Prophylaxis  SCDs  Code  Status: Full  Family Communication: None at bedside  Disposition Plan: Admitted. Dispo TBD. Will transfer to medical floor   Consultants PCCM General surgery Neurology  Oncology Palliative care  Procedures  Continuous EEG  Antibiotics   Anti-infectives    Start     Dose/Rate Route Frequency Ordered Stop   07/22/17 0500  vancomycin (VANCOCIN) IVPB 1000 mg/200 mL premix  Status:  Discontinued     1,000 mg 200 mL/hr over 60 Minutes Intravenous Every 12 hours 07/21/17 1545 07/24/17 1047   07/21/17 2200  piperacillin-tazobactam (ZOSYN) IVPB 3.375 g     3.375 g 12.5 mL/hr over 240 Minutes Intravenous Every 8 hours 07/21/17 1545     07/21/17 1630  vancomycin (VANCOCIN) 1,500 mg in sodium chloride 0.9 % 500 mL IVPB     1,500 mg 250 mL/hr over 120 Minutes Intravenous  Once 07/21/17 1545 07/22/17 0000   07/21/17 1500  piperacillin-tazobactam (ZOSYN) IVPB 3.375 g     3.375 g 100 mL/hr over 30 Minutes Intravenous  Once 07/21/17 1449 07/21/17 1638   07/04/17 1100  Ampicillin-Sulbactam (UNASYN) 3 g in sodium chloride 0.9 % 100 mL IVPB     3 g 200 mL/hr over 30 Minutes Intravenous Every 6 hours 07/04/17 0823 07/10/17 0429   07/03/17 1600  Ampicillin-Sulbactam (UNASYN) 3 g in sodium chloride 0.9 % 100 mL IVPB  Status:  Discontinued     3 g 200 mL/hr over 30 Minutes Intravenous Every 6 hours 07/03/17 1047 07/04/17 0823   06/29/17 1400  ceFEPIme (MAXIPIME) 2 g in dextrose 5 % 50 mL IVPB  Status:  Discontinued     2 g 100 mL/hr over 30 Minutes Intravenous Every 8 hours 06/29/17 0853 06/29/17 0857   06/29/17 1000  ceFEPIme (MAXIPIME) 2 g in dextrose 5 % 50 mL IVPB  Status:  Discontinued     2 g 100 mL/hr over 30 Minutes Intravenous Every 8 hours 06/29/17 0857 07/03/17 1046  06/29/17 0930  metroNIDAZOLE (FLAGYL) IVPB 500 mg  Status:  Discontinued     500 mg 100 mL/hr over 60 Minutes Intravenous Every 8 hours 06/29/17 0838 06/30/17 0956   06/24/17 0600  piperacillin-tazobactam (ZOSYN) IVPB  3.375 g  Status:  Discontinued     3.375 g 12.5 mL/hr over 240 Minutes Intravenous Every 8 hours 06/23/17 2254 06/29/17 0838   06/23/17 2300  piperacillin-tazobactam (ZOSYN) IVPB 3.375 g     3.375 g 100 mL/hr over 30 Minutes Intravenous  Once 06/23/17 2254 06/24/17 0056      Subjective:   [name] seen and examined today. Patient awake today. No complaints. Would like to eat solid food, particularly boiled chicken wings. Denies chest pain, shortness of breath, abdominal pain, nausea, vomiting, diarrhea, constipation.   Objective:   Vitals:   07/27/17 0300 07/27/17 0417 07/27/17 0500 07/27/17 0800  BP: (!) 167/98   (!) 158/90  Pulse:    (!) 102  Resp: (!) 23   20  Temp:  98.7 F (37.1 C)  98.7 F (37.1 C)  TempSrc:  Oral  Oral  SpO2:    100%  Weight:   93.9 kg (207 lb 0.2 oz)   Height:        Intake/Output Summary (Last 24 hours) at 07/27/17 1017 Last data filed at 07/27/17 0300  Gross per 24 hour  Intake             1920 ml  Output                0 ml  Net             1920 ml   Filed Weights   07/25/17 0403 07/26/17 0400 07/27/17 0500  Weight: 94.3 kg (207 lb 14.3 oz) 94.6 kg (208 lb 8 oz) 93.9 kg (207 lb 0.2 oz)   Exam  General: Well developed, chronically ill appearing, NAD  HEENT: NCAT, mucous membranes moist.  Cardiovascular: S1 S2 auscultated, RRR  Respiratory: Clear to auscultation bilaterally with equal chest rise  Abdomen: Soft, nontender, nondistended, + bowel sounds, dressing in place.   Extremities: warm dry without cyanosis clubbing. Trace LE edema.  Neuro: AAOx3,nonfocal  Psych: Appropriate and pleasant  Data Reviewed: I have personally reviewed following labs and imaging studies  CBC:  Recent Labs Lab 07/21/17 1800 07/22/17 0356 07/23/17 0625 07/24/17 0500 07/25/17 1520 07/26/17 0242 07/27/17 0457  WBC 12.3* 11.5* 15.0* 15.1* 11.0* 10.7* 9.7  NEUTROABS 10.0* 9.8* 13.1* 13.3* 9.0*  --   --   HGB 8.1* 7.0* 6.6* 7.2* 8.0*  7.8* 7.5*  HCT 26.9* 23.9* 22.2* 23.9* 25.7* 25.9* 25.1*  MCV 91.2 90.2 88.8 88.2 88.3 87.2 86.9  PLT 465* 388 388 407* 443* 439* 154*   Basic Metabolic Panel:  Recent Labs Lab 07/22/17 0356 07/23/17 0625 07/24/17 0500 07/25/17 1520 07/26/17 1341 07/27/17 0457  NA 146* 149* 151* 148* 144 143  K 3.3* 3.7 3.0* 2.4* 3.2* 3.0*  CL 115* 121* 122* 120* 116* 113*  CO2 22 22 24 23 23 22   GLUCOSE 120* 81 132* 110* 148* 130*  BUN 47* 33* 19 9 7 6   CREATININE 1.06* 0.76 0.63 0.52 0.61 0.56  CALCIUM 7.4* 7.5* 7.7* 7.4* 7.3* 7.6*  MG 1.9 1.8 1.7 1.4*  --  1.6*   GFR: Estimated Creatinine Clearance: 82.9 mL/min (by C-G formula based on SCr of 0.56 mg/dL). Liver Function Tests:  Recent Labs Lab 07/21/17 1800 07/23/17 0625 07/24/17 0500 07/25/17 1520  AST 28 19 16 16   ALT 12* 13* 11* 11*  ALKPHOS 150* 119 118 98  BILITOT 0.5 0.5 0.8 0.4  PROT 5.4* 5.0* 5.0* 4.9*  ALBUMIN 1.4* 1.2* 1.1* 1.2*   No results for input(s): LIPASE, AMYLASE in the last 168 hours. No results for input(s): AMMONIA in the last 168 hours. Coagulation Profile:  Recent Labs Lab 07/21/17 1800  INR 1.66   Cardiac Enzymes: No results for input(s): CKTOTAL, CKMB, CKMBINDEX, TROPONINI in the last 168 hours. BNP (last 3 results) No results for input(s): PROBNP in the last 8760 hours. HbA1C: No results for input(s): HGBA1C in the last 72 hours. CBG:  Recent Labs Lab 07/26/17 0741 07/26/17 1220 07/26/17 1601 07/26/17 2117 07/27/17 0801  GLUCAP 105* 127* 111* 142* 113*   Lipid Profile: No results for input(s): CHOL, HDL, LDLCALC, TRIG, CHOLHDL, LDLDIRECT in the last 72 hours. Thyroid Function Tests: No results for input(s): TSH, T4TOTAL, FREET4, T3FREE, THYROIDAB in the last 72 hours. Anemia Panel: No results for input(s): VITAMINB12, FOLATE, FERRITIN, TIBC, IRON, RETICCTPCT in the last 72 hours. Urine analysis:    Component Value Date/Time   COLORURINE AMBER (A) 07/21/2017 1552   APPEARANCEUR  HAZY (A) 07/21/2017 1552   LABSPEC 1.024 07/21/2017 1552   LABSPEC 1.010 08/17/2015 1542   PHURINE 5.0 07/21/2017 1552   GLUCOSEU NEGATIVE 07/21/2017 1552   GLUCOSEU 100 08/17/2015 1542   HGBUR SMALL (A) 07/21/2017 1552   BILIRUBINUR NEGATIVE 07/21/2017 1552   BILIRUBINUR Negative 08/17/2015 1542   KETONESUR NEGATIVE 07/21/2017 1552   PROTEINUR 30 (A) 07/21/2017 1552   UROBILINOGEN 1.0 08/26/2015 0725   UROBILINOGEN 0.2 08/17/2015 1542   NITRITE NEGATIVE 07/21/2017 1552   LEUKOCYTESUR SMALL (A) 07/21/2017 1552   LEUKOCYTESUR Trace 08/17/2015 1542   Sepsis Labs: @LABRCNTIP (procalcitonin:4,lacticidven:4)  ) Recent Results (from the past 240 hour(s))  C difficile quick scan w PCR reflex     Status: None   Collection Time: 07/20/17 12:40 PM  Result Value Ref Range Status   C Diff antigen NEGATIVE NEGATIVE Final   C Diff toxin NEGATIVE NEGATIVE Final   C Diff interpretation No C. difficile detected.  Final  Gastrointestinal Panel by PCR , Stool     Status: None   Collection Time: 07/20/17 12:40 PM  Result Value Ref Range Status   Campylobacter species NOT DETECTED NOT DETECTED Final   Plesimonas shigelloides NOT DETECTED NOT DETECTED Final   Salmonella species NOT DETECTED NOT DETECTED Final   Yersinia enterocolitica NOT DETECTED NOT DETECTED Final   Vibrio species NOT DETECTED NOT DETECTED Final   Vibrio cholerae NOT DETECTED NOT DETECTED Final   Enteroaggregative E coli (EAEC) NOT DETECTED NOT DETECTED Final   Enteropathogenic E coli (EPEC) NOT DETECTED NOT DETECTED Final   Enterotoxigenic E coli (ETEC) NOT DETECTED NOT DETECTED Final   Shiga like toxin producing E coli (STEC) NOT DETECTED NOT DETECTED Final   Shigella/Enteroinvasive E coli (EIEC) NOT DETECTED NOT DETECTED Final   Cryptosporidium NOT DETECTED NOT DETECTED Final   Cyclospora cayetanensis NOT DETECTED NOT DETECTED Final   Entamoeba histolytica NOT DETECTED NOT DETECTED Final   Giardia lamblia NOT DETECTED NOT  DETECTED Final   Adenovirus F40/41 NOT DETECTED NOT DETECTED Final   Astrovirus NOT DETECTED NOT DETECTED Final   Norovirus GI/GII NOT DETECTED NOT DETECTED Final   Rotavirus A NOT DETECTED NOT DETECTED Final   Sapovirus (I, II, IV, and V) NOT DETECTED NOT DETECTED Final  Culture, Urine     Status:  Abnormal   Collection Time: 07/21/17  3:51 PM  Result Value Ref Range Status   Specimen Description URINE, CATHETERIZED  Final   Special Requests NONE  Final   Culture MULTIPLE SPECIES PRESENT, SUGGEST RECOLLECTION (A)  Final   Report Status 07/23/2017 FINAL  Final  Culture, blood (x 2)     Status: None   Collection Time: 07/21/17  4:25 PM  Result Value Ref Range Status   Specimen Description BLOOD RIGHT HAND  Final   Special Requests   Final    BOTTLES DRAWN AEROBIC ONLY Blood Culture results may not be optimal due to an inadequate volume of blood received in culture bottles   Culture NO GROWTH 5 DAYS  Final   Report Status 07/26/2017 FINAL  Final  Culture, blood (x 2)     Status: None   Collection Time: 07/21/17  6:00 PM  Result Value Ref Range Status   Specimen Description BLOOD RIGHT ARM  Final   Special Requests   Final    BOTTLES DRAWN AEROBIC AND ANAEROBIC Blood Culture adequate volume   Culture NO GROWTH 5 DAYS  Final   Report Status 07/26/2017 FINAL  Final      Radiology Studies: No results found.   Scheduled Meds: . feeding supplement (GLUCERNA SHAKE)  237 mL Oral TID BM  . folic acid  1 mg Per Tube Daily  . insulin aspart  0-15 Units Subcutaneous TID WC  . insulin aspart  0-5 Units Subcutaneous QHS  . mirtazapine  7.5 mg Oral QHS  . multivitamin with minerals  1 tablet Oral Daily  . potassium chloride  40 mEq Oral Daily   Continuous Infusions: . sodium chloride    . dextrose 5 % with KCl 20 mEq / L 20 mEq (07/27/17 0300)  . lacosamide (VIMPAT) IV 100 mg (07/27/17 0952)  . levETIRAcetam Stopped (07/27/17 0844)  . piperacillin-tazobactam (ZOSYN)  IV Stopped  (07/27/17 0951)  . potassium chloride 10 mEq (07/27/17 0930)     LOS: 34 days   Time Spent in minutes   30 minutes  Alisa Stjames D.O. on 07/27/2017 at 10:17 AM  Between 7am to 7pm - Pager - [phone]  After 7pm go to [email or web address] - password [email or web address]  And look for the night coverage person covering for me after hours  [facility] Office  [phone]

## 2017-07-27 NOTE — Progress Notes (Signed)
No charge note:  Family members have not returned call to Palliative Medicine for further [place]. Noted patient's improvement in mental status and progressing diet per chart review with plans for d/c to SNF over weekend.   Palliative service will shadow chart and intervene if needed. Recommend f/u with outpatient palliative services at SNF (will need to be ordered by primary MD at facility).  Lori [name], AGNP-C Palliative Medicine  Please call Palliative Medicine team phone with any questions [phone]. For individual providers please see AMION.

## 2017-07-27 NOTE — Clinical Social Work Note (Addendum)
CSW faxed clinicals to [facility] for potential weekend discharge to [facility].   Dayton Scrape, CSW [phone]  1:19 pm CSW confirmed with [facility] that they received the clinicals. It has been assigned to a case worker: Belenda Cruise.  Dayton Scrape, [place] [phone]  4:07 pm Insurance authorization approved: [id number] rug level RUC. SNF admissions coordinator notified.  Dayton Scrape, [place]

## 2017-07-27 NOTE — Progress Notes (Signed)
Brief Nutrition Follow-Up Note  Chart reviewed; pt has been advanced to a full liquid diet. Will d/c Boost Breeze po TID, each supplement provides 250 kcal and 9 grams of protein and add Glucerna Shake po TID, each supplement provides 220 kcal and 10 grams of protein.   Palliative care attempting to organize family meeting o discuss goals of care.   RD will continue to follow.   Greenleigh Kauth A. Jimmye Norman, RD, LDN, CDE Pager: [phone] After hours Pager: [phone]

## 2017-07-27 NOTE — Evaluation (Signed)
Occupational Therapy Evaluation Patient Details Name: Lori [name] MRN: [id number] DOB: 08/14/1951 Today's Date: 07/27/2017    History of Present Illness pt is a 66 y/o female with pmh significant for DM , Seizure d/o HTN, h/o brain aneurysm, cervical CA, metastatic mucinous adenocarcinoma of the appendix, admitted from SNF with c/o bloody bowel movements.  In hospital pt appeared to have seizure resulting in encephalopathy.  CT/MRI's suggest L thalamic, L cerebellar and right frontal lobe encephalomalacia   Clinical Impression   This 66 yo female admitted with above presents to acute OT with deficits below (see OT problem list) thus affecting her ability to care for herself. She will benefit from acute OT with follow up OT at SNF to work on increasing independence and decreasing burden of care.    Follow Up Recommendations  SNF;Supervision/Assistance - 24 hour    Equipment Recommendations  None recommended by OT       Precautions / Restrictions Precautions Precautions: Fall Precaution Comments: rectal tube, foley Restrictions Weight Bearing Restrictions: No      Mobility Bed Mobility Overal bed mobility: Needs Assistance Bed Mobility: Rolling Rolling: Total assist         General bed mobility comments: left and right; pt can slightly A with rasing up opposite leg of side rolling to and can reach with opposite arm to side rolling to, but cannot begin to pull herself over to left or right         ADL either performed or assessed with clinical judgement   ADL Overall ADL's : Needs assistance/impaired Eating/Feeding: Minimal assistance Eating/Feeding Details (indicate cue type and reason): with proper positioning and set up Grooming: Bed level Grooming Details (indicate cue type and reason): Wash/dry hands set up/S; wash/dry face setup/S; oral care max A; applying deodorant total A; brushing hair total A Upper Body Bathing: Maximal assistance;Bed level   Lower Body  Bathing: Total assistance;Bed level   Upper Body Dressing : Total assistance;Bed level   Lower Body Dressing: Total assistance;Bed level                       Vision Patient Visual Report: No change from baseline Additional Comments: decreased smoothness of tracking            Pertinent Vitals/Pain Pain Assessment: No/denies pain     Hand Dominance  right   Extremity/Trunk Assessment Upper Extremity Assessment Upper Extremity Assessment: RUE deficits/detail;LUE deficits/detail RUE Deficits / Details: Supine 3/5 throughout LUE Deficits / Details: 2/5 throughout (pt reports her left side has been weaker since aneurysm in past LUE Coordination: decreased fine motor;decreased gross motor           Communication  None   Cognition Arousal/Alertness: Awake/alert Behavior During Therapy: WFL for tasks assessed/performed Overall Cognitive Status: Impaired/Different from baseline Area of Impairment: Attention;Following commands;Awareness;Problem solving                   Current Attention Level: Sustained   Following Commands: Follows one step commands with increased time   Awareness: Intellectual Problem Solving: Requires verbal cues;Requires tactile cues                Home Living Family/patient expects to be discharged to:: Skilled nursing facility  OT Problem List: Decreased strength;Decreased range of motion;Impaired balance (sitting and/or standing);Decreased cognition;Decreased knowledge of use of DME or AE;Impaired UE functional use;Obesity      OT Treatment/Interventions: Self-care/ADL training;Therapeutic activities;Patient/family education;Balance training    OT Goals(Current goals can be found in the care plan section) Acute Rehab OT Goals Patient Stated Goal: to get stronger OT Goal Formulation: With patient Time For Goal Achievement: 08-15-2017 Potential to Achieve  Goals: Fair  OT Frequency: Min 2X/week              AM-PAC PT "6 Clicks" Daily Activity     Outcome Measure Help from another person eating meals?: A Little (if postioned properly and set up) Help from another person taking care of personal grooming?: A Lot Help from another person toileting, which includes using toliet, bedpan, or urinal?: Total Help from another person bathing (including washing, rinsing, drying)?: Total Help from another person to put on and taking off regular upper body clothing?: Total Help from another person to put on and taking off regular lower body clothing?: Total 6 Click Score: 9   End of Session    Activity Tolerance: Patient tolerated treatment well Patient left: in bed;with call bell/phone within reach  OT Visit Diagnosis: Other abnormalities of gait and mobility (R26.89);Muscle weakness (generalized) (M62.81);Other symptoms and signs involving cognitive function                Time: 6269-4854 OT Time Calculation (min): 13 min Charges:  OT General Charges $OT Visit: 1 Procedure OT Evaluation $OT Eval Moderate Complexity: 1 Procedure [place], OTR/L [phone] 07/27/2017

## 2017-07-27 NOTE — Progress Notes (Signed)
Subjective/Chief Complaint: No complaints today, tol clears fine, no abd pain   Objective: Vital signs in last 24 hours: Temp:  [98 F (36.7 C)-99.3 F (37.4 C)] 98.7 F (37.1 C) (08/24 0417) Pulse Rate:  [99-110] 110 (08/23 1920) Resp:  [20-26] 23 (08/24 0300) BP: (143-167)/(91-98) 167/98 (08/24 0300) SpO2:  [99 %-100 %] 99 % (08/23 1920) Weight:  [93.9 kg (207 lb 0.2 oz)] 93.9 kg (207 lb 0.2 oz) (08/24 0500) Last BM Date: 07/26/17  Intake/Output from previous day: 08/23 0701 - 08/24 0700 In: 1920 [P.O.:120; I.V.:1800] Out: -  Intake/Output this shift: No intake/output data recorded.  GI: soft nontender nondistended bs present wound unchanged  Lab Results:   Recent Labs  07/26/17 0242 07/27/17 0457  WBC 10.7* 9.7  HGB 7.8* 7.5*  HCT 25.9* 25.1*  PLT 439* 440*   BMET  Recent Labs  07/26/17 1341 07/27/17 0457  NA 144 143  K 3.2* 3.0*  CL 116* 113*  CO2 23 22  GLUCOSE 148* 130*  BUN 7 6  CREATININE 0.61 0.56  CALCIUM 7.3* 7.6*   PT/INR No results for input(s): LABPROT, INR in the last 72 hours. ABG No results for input(s): PHART, HCO3 in the last 72 hours.  Invalid input(s): PCO2, PO2  Studies/Results: No results found.  Anti-infectives: Anti-infectives    Start     Dose/Rate Route Frequency Ordered Stop   07/22/17 0500  vancomycin (VANCOCIN) IVPB 1000 mg/200 mL premix  Status:  Discontinued     1,000 mg 200 mL/hr over 60 Minutes Intravenous Every 12 hours 07/21/17 1545 07/24/17 1047   07/21/17 2200  piperacillin-tazobactam (ZOSYN) IVPB 3.375 g     3.375 g 12.5 mL/hr over 240 Minutes Intravenous Every 8 hours 07/21/17 1545     07/21/17 1630  vancomycin (VANCOCIN) 1,500 mg in sodium chloride 0.9 % 500 mL IVPB     1,500 mg 250 mL/hr over 120 Minutes Intravenous  Once 07/21/17 1545 07/22/17 0000   07/21/17 1500  piperacillin-tazobactam (ZOSYN) IVPB 3.375 g     3.375 g 100 mL/hr over 30 Minutes Intravenous  Once 07/21/17 1449 07/21/17 1638    07/04/17 1100  Ampicillin-Sulbactam (UNASYN) 3 g in sodium chloride 0.9 % 100 mL IVPB     3 g 200 mL/hr over 30 Minutes Intravenous Every 6 hours 07/04/17 0823 07/10/17 0429   07/03/17 1600  Ampicillin-Sulbactam (UNASYN) 3 g in sodium chloride 0.9 % 100 mL IVPB  Status:  Discontinued     3 g 200 mL/hr over 30 Minutes Intravenous Every 6 hours 07/03/17 1047 07/04/17 0823   06/29/17 1400  ceFEPIme (MAXIPIME) 2 g in dextrose 5 % 50 mL IVPB  Status:  Discontinued     2 g 100 mL/hr over 30 Minutes Intravenous Every 8 hours 06/29/17 0853 06/29/17 0857   06/29/17 1000  ceFEPIme (MAXIPIME) 2 g in dextrose 5 % 50 mL IVPB  Status:  Discontinued     2 g 100 mL/hr over 30 Minutes Intravenous Every 8 hours 06/29/17 0857 07/03/17 1046   06/29/17 0930  metroNIDAZOLE (FLAGYL) IVPB 500 mg  Status:  Discontinued     500 mg 100 mL/hr over 60 Minutes Intravenous Every 8 hours 06/29/17 0838 06/30/17 0956   06/24/17 0600  piperacillin-tazobactam (ZOSYN) IVPB 3.375 g  Status:  Discontinued     3.375 g 12.5 mL/hr over 240 Minutes Intravenous Every 8 hours 06/23/17 2254 06/29/17 0838   06/23/17 2300  piperacillin-tazobactam (ZOSYN) IVPB 3.375 g  3.375 g 100 mL/hr over 30 Minutes Intravenous  Once 06/23/17 2254 06/24/17 0056      Assessment/Plan: Primary appendiceal carcinoma with metastatic disease, stage IV  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 [name]: 1. 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  2. Exploratory laparotomy, drainage of abdominal abscess, repair of small bowel perforation, repair of small bowel enterotomy, enterocolostomy (mid ileum to mid transverse colon). - local wound care, can continue some debridement but I dont this this will likely ever heal at this point Likely small bowel ischemia, pneumatosis on ct  - Do not recommend surgical intervention. Stage 4 cancer  is not amenable to surgical treatment and operative intervention for pneumatosis would unlikely change patient outcome.  - Her exam is benign, WBC is normal, continue abx, will advance diet to full liquids and probably soft diet in am tomorrow. It appears whatever process occurred is better clinically and I think possibly doing an additional 5 d oral abx is fine at this point. She can be dispositioned from our standpoint at any time now.   [facility] 07/27/2017

## 2017-07-28 DIAGNOSIS — Z978 Presence of other specified devices: Secondary | ICD-10-CM

## 2017-07-28 LAB — BASIC METABOLIC PANEL
Anion gap: 6 (ref 5–15)
BUN: 5 mg/dL — ABNORMAL LOW (ref 6–20)
CHLORIDE: 112 mmol/L — AB (ref 101–111)
CO2: 21 mmol/L — AB (ref 22–32)
CREATININE: 0.69 mg/dL (ref 0.44–1.00)
Calcium: 7.4 mg/dL — ABNORMAL LOW (ref 8.9–10.3)
GFR calc non Af Amer: >60 mL/min (ref >60)
Glucose, Bld: 111 mg/dL — ABNORMAL HIGH (ref 65–99)
POTASSIUM: 3.7 mmol/L (ref 3.5–5.1)
Sodium: 139 mmol/L (ref 135–145)

## 2017-07-28 LAB — CBC
HEMATOCRIT: 25.1 % — AB (ref 36.0–46.0)
HEMOGLOBIN: 7.7 g/dL — AB (ref 12.0–15.0)
MCH: 26.6 pg (ref 26.0–34.0)
MCHC: 30.7 g/dL (ref 30.0–36.0)
MCV: 86.6 fL (ref 78.0–100.0)
Platelets: 477 10*3/uL — ABNORMAL HIGH (ref 150–400)
RBC: 2.9 MIL/uL — AB (ref 3.87–5.11)
RDW: 17.2 % — ABNORMAL HIGH (ref 11.5–15.5)
WBC: 13.5 10*3/uL — ABNORMAL HIGH (ref 4.0–10.5)

## 2017-07-28 LAB — GLUCOSE, CAPILLARY
GLUCOSE-CAPILLARY: 101 mg/dL — AB (ref 65–99)
GLUCOSE-CAPILLARY: 114 mg/dL — AB (ref 65–99)
GLUCOSE-CAPILLARY: 99 mg/dL (ref 65–99)
Glucose-Capillary: 106 mg/dL — ABNORMAL HIGH (ref 65–99)

## 2017-07-28 LAB — MAGNESIUM: Magnesium: 1.6 mg/dL — ABNORMAL LOW (ref 1.7–2.4)

## 2017-07-28 MED ORDER — MAGNESIUM CHLORIDE 64 MG PO TBEC
1.0000 | DELAYED_RELEASE_TABLET | Freq: Every day | ORAL | Status: DC
  Administered 2017-07-28 – 2017-08-09 (×12): 64 mg via ORAL
  Filled 2017-07-28 (×16): qty 1

## 2017-07-28 MED ORDER — MAGNESIUM SULFATE 4 GM/100ML IV SOLN
4.0000 g | Freq: Once | INTRAVENOUS | Status: AC
  Administered 2017-07-28: 4 g via INTRAVENOUS
  Filled 2017-07-28: qty 100

## 2017-07-28 MED ORDER — POTASSIUM CHLORIDE 20 MEQ/15ML (10%) PO SOLN
40.0000 meq | Freq: Two times a day (BID) | ORAL | Status: DC
Start: 2017-07-28 — End: 2017-07-30
  Administered 2017-07-28 – 2017-07-29 (×3): 40 meq via ORAL
  Filled 2017-07-28 (×4): qty 30

## 2017-07-28 NOTE — Clinical Social Work Note (Signed)
CSW notified SNF of possible discharge for tomorrow.  Dayton Scrape, [place]

## 2017-07-28 NOTE — Progress Notes (Signed)
PROGRESS NOTE    Lori [name]  YIR:[id number] DOB: 1951-02-14 DOA: 06/23/2017 PCP: [name], MD   Chief Complaint  Patient presents with  . 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 She was at a skilled nursing facility. She was recently hospitalized for small bowel obstruction and underwent diagnostic laparoscopy in June. She underwent repair of small bowel perforation. She had a prolonged hospital stay. Subsequently was discharged to skilled nursing facility. Presented back with rectal bleeding. Seen by oncology. Not thought to be a candidate for any treatment. Then she had what appears to be a seizure resulting in encephalopathy. Neurology was consulted. Palliative medicine continues to follow. Family still desires aggressive care. Patient was transferred to stepdown unit. Critical care medicine was consulted. Patient noted to have profuse urine output. 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 Patient improving. Had MBS, started on dysphagia 2 diet however still poor oral intake, therefore NGT still in place. Patient transitioned to oral Keppra, Tegretol. Mental status improving. Neurology felt no need for lumbar puncture as mental status has been improving.  Assessment & Plan   Severe sepsis secondary to pneumatosis and bowel ischemia -Placed on vancomycin and Zosyn initially however vancomycin was discontinued as there is no evidence of MRSA -Patient still not candidate for surgery per general surgery -Blood cultures show no growth to date continue IV fluids -Surgery recommended medical management and continuing oral antibiotics for additional 5 days (when able to tolerate diet) -NG tube removed -Leukocytosis improved, down to  9.7 (from 15)  Breakthrough seizure/history of seizure disorder -Neurology consulted and appreciated -Patient placed on IV Keppra and Vimpat -Neurology recommended continuing keppra 1g BID, Vimpat 100mg  BID  -Discussed with Dr. Rory Percy, will discontinue tegretol (initally held as patient was NPO after seizure activity, and it was initially thought that she may not have been compliant) -CT head on 07/21/17: no definite CT evidence for acute intracranial abnormality  -Tegretol discontinued -Continue seizure precautions  Acute metabolic encephalopathy/Left Thalamic Stroke/ Bilateral small SDH -Patient was noted to have garbled speech on the afternoon of 06/28/2017 -Neurology consultation appreciated -CT head on 8/1: Evolving encephalomalacia at suspected left thalamic lacunar infarct. Previously identified tiny bilateral subdural hematoma -MRI brain on 7/29: New tiny bilateral subdural hematomas, trace subarachnoid hemorrhage, punctate focus of diffusion abnormality in the dorsal thalamus -It was thought the patient was having seizure activity and she was loaded with Keppra -Patient has had continuous EEG monitoring: Progressively improving diffuse encephalopathy  -Neurology feels lumbar puncture is no longer necessary given that her mental status is improving -Patient had modified barium swallow, was placed on dysphagia 2 diet, however was made NPO due to seizure  -Mental status has improved, AAOx3 -Tolerated full liquids, will transition to soft diet  Diabetes insipidus/polyuria/Hypernatremia -Patient had excessive urine output thought to be due to central diabetes insipidus due to abnormalities noted on MRI of the brain -Resolved however patient was then placed on IV fluids after seizure episode. -Sodium down to 139 -will discontinue D5 -Continue to monitor urine output, over the past 24 hours 825cc  Primary appendiceal carcinoma with metastatic disease, stage IV with abdominal  wound -Oncology consultation appreciated, patient is not a candidate for chemotherapy and is thought to have an incurable condition. Poor prognosis -Palliative care was consulted. -had recent SBO due  to carcinomatosis status post laparotomy, drainage of intra-abdominal abscess, repair of small bowel perforation -Gen. surgery consulted and following- recommended TID wet to dry dressing changes -Of note, patient completed course of IV Unasyn which was used to treat urinary tract infection and covered her abdominal abscess  Rectal bleeding of unknown cause -Appears to be stable, patient is not a candidate for endoscopic evaluation due to recent surgery. This was discussed by previous hospitalist with general surgery.  Anemia secondary to acute blood loss -Hemoglobin currently 7.7, appears stable -patient was transfused 1uPRBC on 07/23/2017 -transfuse if hemoglobin <7 -Continue to monitor CBC  Diabetes mellitus, type II -Continue insulin sliding scale CBG monitoring  Essential hypertension -Stable, will continue clonidine, and PRN hydralazine  Enterococcus UTI -resolved, treated with unasyn  Nutrition -Continues to be encephalopathic with poor oral intake -Was seen by nutrition/speech therapy and placed on dysphagia diet however patient did have a seizure -NG tube was removed -Will transition to soft diet today -will discontinue D5 -Likely not a good candidate for PEG tube given her carcinomatosis  Hypokalemia/hypomagnesemia -potassium 3.7, will continue PO supplementation -Magnesium 1.6, despite supplementation, Will continue to replace- will give 4g IV as well as start PO supplementation  -Continue to monitor BMP and Magnesium   Stage II pressure ulcer of the buttocks -Continue wound care  Goals of care -Palliative care was consulted and appreciated however family wishes to continue aggressive care -Feel patient has an overall poor prognosis given her poor oral intake, seizure  activity, multiple comorbidities, stage IV appendiceal cancer -Trying to coordinate family meeting- unfortunately, family has not returned phone call to palliative care -Discussed with son today, will transition patient to soft diet. If she is able to tolerate, will likely discharge patient to SNF on 8/26.   DVT Prophylaxis  SCDs  Code Status: Full  Family Communication: Son at bedside (sleeping)  Disposition Plan: Admitted. Will transition diet, continue to monitor electrolytes and CBC. Possible discharge to SNF on 8/26.  Consultants PCCM General surgery Neurology  Oncology Palliative care  Procedures  Continuous EEG  Antibiotics   Anti-infectives    Start     Dose/Rate Route Frequency Ordered Stop   07/22/17 0500  vancomycin (VANCOCIN) IVPB 1000 mg/200 mL premix  Status:  Discontinued     1,000 mg 200 mL/hr over 60 Minutes Intravenous Every 12 hours 07/21/17 1545 07/24/17 1047   07/21/17 2200  piperacillin-tazobactam (ZOSYN) IVPB 3.375 g     3.375 g 12.5 mL/hr over 240 Minutes Intravenous Every 8 hours 07/21/17 1545     07/21/17 1630  vancomycin (VANCOCIN) 1,500 mg in sodium chloride 0.9 % 500 mL IVPB     1,500 mg 250 mL/hr over 120 Minutes Intravenous  Once 07/21/17 1545 07/22/17 0000   07/21/17 1500  piperacillin-tazobactam (ZOSYN) IVPB 3.375 g     3.375 g 100 mL/hr over 30 Minutes Intravenous  Once 07/21/17 1449 07/21/17 1638   07/04/17 1100  Ampicillin-Sulbactam (UNASYN) 3 g in sodium chloride 0.9 % 100 mL IVPB     3 g 200 mL/hr over 30 Minutes Intravenous Every 6 hours 07/04/17 0823 07/10/17 0429   07/03/17 1600  Ampicillin-Sulbactam (UNASYN) 3 g in sodium chloride 0.9 % 100 mL IVPB  Status:  Discontinued     3 g 200 mL/hr over 30 Minutes Intravenous Every 6 hours 07/03/17 1047 07/04/17 0823   06/29/17 1400  ceFEPIme (MAXIPIME) 2 g in dextrose 5 % 50 mL IVPB  Status:  Discontinued  2 g 100 mL/hr over 30 Minutes Intravenous Every 8 hours 06/29/17 0853 06/29/17  0857   06/29/17 1000  ceFEPIme (MAXIPIME) 2 g in dextrose 5 % 50 mL IVPB  Status:  Discontinued     2 g 100 mL/hr over 30 Minutes Intravenous Every 8 hours 06/29/17 0857 07/03/17 1046   06/29/17 0930  metroNIDAZOLE (FLAGYL) IVPB 500 mg  Status:  Discontinued     500 mg 100 mL/hr over 60 Minutes Intravenous Every 8 hours 06/29/17 0838 06/30/17 0956   06/24/17 0600  piperacillin-tazobactam (ZOSYN) IVPB 3.375 g  Status:  Discontinued     3.375 g 12.5 mL/hr over 240 Minutes Intravenous Every 8 hours 06/23/17 2254 06/29/17 0838   06/23/17 2300  piperacillin-tazobactam (ZOSYN) IVPB 3.375 g     3.375 g 100 mL/hr over 30 Minutes Intravenous  Once 06/23/17 2254 06/24/17 0056      Subjective:   [name] seen and examined today. Patient with no complaints this morning. She feels she is ready to eat solid food. Denies chest pain, shortness of breath, abdominal pain, nausea, vomiting.   Objective:   Vitals:   07/27/17 1200 07/27/17 1637 07/27/17 2100 07/28/17 0450  BP: (!) 147/96 (!) 162/93 (!) 162/79 (!) 156/90  Pulse: 75 (!) 103 (!) 104 (!) 112  Resp: (!) 24 20 18 17   Temp:  100.1 F (37.8 C) 99.4 F (37.4 C) 100.3 F (37.9 C)  TempSrc:  Oral Oral Oral  SpO2: 100% 100% 100% 100%  Weight:  100.7 kg (222 lb 1.6 oz)    Height:  5\' 8"  (1.727 m)      Intake/Output Summary (Last 24 hours) at 07/28/17 1148 Last data filed at 07/28/17 0653  Gross per 24 hour  Intake          2421.25 ml  Output             1300 ml  Net          1121.25 ml   Filed Weights   07/26/17 0400 07/27/17 0500 07/27/17 1637  Weight: 94.6 kg (208 lb 8 oz) 93.9 kg (207 lb 0.2 oz) 100.7 kg (222 lb 1.6 oz)   Exam  General: Well developed, ill-appearing, no apparent distress  HEENT: NCAT, mucous membranes moist.   Cardiovascular: S1 S2 auscultated, RRR  Respiratory: Clear to auscultation bilaterally anteriorly, no wheezing   Abdomen: Soft, nontender, nondistended, + bowel sounds, dressing in  place  Extremities: warm dry without cyanosis clubbing. Trace LE edema  Neuro: AAOx3, nonfocal  Psych: pleasant, appropriate mood and affect  Data Reviewed: I have personally reviewed following labs and imaging studies  CBC:  Recent Labs Lab 07/21/17 1800 07/22/17 0356 07/23/17 0625 07/24/17 0500 07/25/17 1520 07/26/17 0242 07/27/17 0457 07/28/17 0433  WBC 12.3* 11.5* 15.0* 15.1* 11.0* 10.7* 9.7 13.5*  NEUTROABS 10.0* 9.8* 13.1* 13.3* 9.0*  --   --   --   HGB 8.1* 7.0* 6.6* 7.2* 8.0* 7.8* 7.5* 7.7*  HCT 26.9* 23.9* 22.2* 23.9* 25.7* 25.9* 25.1* 25.1*  MCV 91.2 90.2 88.8 88.2 88.3 87.2 86.9 86.6  PLT 465* 388 388 407* 443* 439* 440* 542*   Basic Metabolic Panel:  Recent Labs Lab 07/23/17 0625 07/24/17 0500 07/25/17 1520 07/26/17 1341 07/27/17 0457 07/28/17 0433  NA 149* 151* 148* 144 143 139  K 3.7 3.0* 2.4* 3.2* 3.0* 3.7  CL 121* 122* 120* 116* 113* 112*  CO2 22 24 23 23 22  21*  GLUCOSE 81 132* 110* 148* 130*  111*  BUN 33* 19 9 7 6  5*  CREATININE 0.76 0.63 0.52 0.61 0.56 0.69  CALCIUM 7.5* 7.7* 7.4* 7.3* 7.6* 7.4*  MG 1.8 1.7 1.4*  --  1.6* 1.6*   GFR: Estimated Creatinine Clearance: 85.8 mL/min (by C-G formula based on SCr of 0.69 mg/dL). Liver Function Tests:  Recent Labs Lab 07/21/17 1800 07/23/17 0625 07/24/17 0500 07/25/17 1520  AST 28 19 16 16   ALT 12* 13* 11* 11*  ALKPHOS 150* 119 118 98  BILITOT 0.5 0.5 0.8 0.4  PROT 5.4* 5.0* 5.0* 4.9*  ALBUMIN 1.4* 1.2* 1.1* 1.2*   No results for input(s): LIPASE, AMYLASE in the last 168 hours. No results for input(s): AMMONIA in the last 168 hours. Coagulation Profile:  Recent Labs Lab 07/21/17 1800  INR 1.66   Cardiac Enzymes: No results for input(s): CKTOTAL, CKMB, CKMBINDEX, TROPONINI in the last 168 hours. BNP (last 3 results) No results for input(s): PROBNP in the last 8760 hours. HbA1C: No results for input(s): HGBA1C in the last 72 hours. CBG:  Recent Labs Lab 07/27/17 0801  07/27/17 1233 07/27/17 1717 07/27/17 2107 07/28/17 0856  GLUCAP 113* 134* 124* 101* 101*   Lipid Profile: No results for input(s): CHOL, HDL, LDLCALC, TRIG, CHOLHDL, LDLDIRECT in the last 72 hours. Thyroid Function Tests: No results for input(s): TSH, T4TOTAL, FREET4, T3FREE, THYROIDAB in the last 72 hours. Anemia Panel: No results for input(s): VITAMINB12, FOLATE, FERRITIN, TIBC, IRON, RETICCTPCT in the last 72 hours. Urine analysis:    Component Value Date/Time   COLORURINE AMBER (A) 07/21/2017 1552   APPEARANCEUR HAZY (A) 07/21/2017 1552   LABSPEC 1.024 07/21/2017 1552   LABSPEC 1.010 08/17/2015 1542   PHURINE 5.0 07/21/2017 1552   GLUCOSEU NEGATIVE 07/21/2017 1552   GLUCOSEU 100 08/17/2015 1542   HGBUR SMALL (A) 07/21/2017 1552   BILIRUBINUR NEGATIVE 07/21/2017 1552   BILIRUBINUR Negative 08/17/2015 1542   KETONESUR NEGATIVE 07/21/2017 1552   PROTEINUR 30 (A) 07/21/2017 1552   UROBILINOGEN 1.0 08/26/2015 0725   UROBILINOGEN 0.2 08/17/2015 1542   NITRITE NEGATIVE 07/21/2017 1552   LEUKOCYTESUR SMALL (A) 07/21/2017 1552   LEUKOCYTESUR Trace 08/17/2015 1542   Sepsis Labs: @LABRCNTIP (procalcitonin:4,lacticidven:4)  ) Recent Results (from the past 240 hour(s))  C difficile quick scan w PCR reflex     Status: None   Collection Time: 07/20/17 12:40 PM  Result Value Ref Range Status   C Diff antigen NEGATIVE NEGATIVE Final   C Diff toxin NEGATIVE NEGATIVE Final   C Diff interpretation No C. difficile detected.  Final  Gastrointestinal Panel by PCR , Stool     Status: None   Collection Time: 07/20/17 12:40 PM  Result Value Ref Range Status   Campylobacter species NOT DETECTED NOT DETECTED Final   Plesimonas shigelloides NOT DETECTED NOT DETECTED Final   Salmonella species NOT DETECTED NOT DETECTED Final   Yersinia enterocolitica NOT DETECTED NOT DETECTED Final   Vibrio species NOT DETECTED NOT DETECTED Final   Vibrio cholerae NOT DETECTED NOT DETECTED Final    Enteroaggregative E coli (EAEC) NOT DETECTED NOT DETECTED Final   Enteropathogenic E coli (EPEC) NOT DETECTED NOT DETECTED Final   Enterotoxigenic E coli (ETEC) NOT DETECTED NOT DETECTED Final   Shiga like toxin producing E coli (STEC) NOT DETECTED NOT DETECTED Final   Shigella/Enteroinvasive E coli (EIEC) NOT DETECTED NOT DETECTED Final   Cryptosporidium NOT DETECTED NOT DETECTED Final   Cyclospora cayetanensis NOT DETECTED NOT DETECTED Final   Entamoeba histolytica NOT DETECTED  NOT DETECTED Final   Giardia lamblia NOT DETECTED NOT DETECTED Final   Adenovirus F40/41 NOT DETECTED NOT DETECTED Final   Astrovirus NOT DETECTED NOT DETECTED Final   Norovirus GI/GII NOT DETECTED NOT DETECTED Final   Rotavirus A NOT DETECTED NOT DETECTED Final   Sapovirus (I, II, IV, and V) NOT DETECTED NOT DETECTED Final  Culture, Urine     Status: Abnormal   Collection Time: 07/21/17  3:51 PM  Result Value Ref Range Status   Specimen Description URINE, CATHETERIZED  Final   Special Requests NONE  Final   Culture MULTIPLE SPECIES PRESENT, SUGGEST RECOLLECTION (A)  Final   Report Status 07/23/2017 FINAL  Final  Culture, blood (x 2)     Status: None   Collection Time: 07/21/17  4:25 PM  Result Value Ref Range Status   Specimen Description BLOOD RIGHT HAND  Final   Special Requests   Final    BOTTLES DRAWN AEROBIC ONLY Blood Culture results may not be optimal due to an inadequate volume of blood received in culture bottles   Culture NO GROWTH 5 DAYS  Final   Report Status 07/26/2017 FINAL  Final  Culture, blood (x 2)     Status: None   Collection Time: 07/21/17  6:00 PM  Result Value Ref Range Status   Specimen Description BLOOD RIGHT ARM  Final   Special Requests   Final    BOTTLES DRAWN AEROBIC AND ANAEROBIC Blood Culture adequate volume   Culture NO GROWTH 5 DAYS  Final   Report Status 07/26/2017 FINAL  Final      Radiology Studies: No results found.   Scheduled Meds: . feeding supplement  (GLUCERNA SHAKE)  237 mL Oral TID BM  . folic acid  1 mg Per Tube Daily  . insulin aspart  0-15 Units Subcutaneous TID WC  . insulin aspart  0-5 Units Subcutaneous QHS  . mirtazapine  7.5 mg Oral QHS  . multivitamin with minerals  1 tablet Oral Daily  . potassium chloride  40 mEq Oral Daily   Continuous Infusions: . sodium chloride    . dextrose 5 % with KCl 20 mEq / L 20 mEq (07/27/17 0300)  . lacosamide (VIMPAT) IV 100 mg (07/28/17 1045)  . levETIRAcetam Stopped (07/28/17 0930)  . piperacillin-tazobactam (ZOSYN)  IV Stopped (07/28/17 1020)     LOS: 35 days   Time Spent in minutes   35 minutes  Lianette Broussard D.O. on 07/28/2017 at 11:48 AM  Between 7am to 7pm - Pager - [phone]  After 7pm go to [email or web address] - password [email or web address]  And look for the night coverage person covering for me after hours  [facility] Office  [phone]

## 2017-07-28 NOTE — Progress Notes (Signed)
Pt BP 160/95 asymptomatic, prn hydralazine if BP > 170.

## 2017-07-29 ENCOUNTER — Inpatient Hospital Stay (HOSPITAL_COMMUNITY): Payer: Medicare Other

## 2017-07-29 DIAGNOSIS — D72829 Elevated white blood cell count, unspecified: Secondary | ICD-10-CM

## 2017-07-29 LAB — CBC WITH DIFFERENTIAL/PLATELET
BASOS PCT: 0 %
Basophils Absolute: 0 10*3/uL (ref 0.0–0.1)
EOS PCT: 1 %
Eosinophils Absolute: 0.2 10*3/uL (ref 0.0–0.7)
HCT: 28.2 % — ABNORMAL LOW (ref 36.0–46.0)
Hemoglobin: 8.5 g/dL — ABNORMAL LOW (ref 12.0–15.0)
LYMPHS ABS: 1.2 10*3/uL (ref 0.7–4.0)
Lymphocytes Relative: 5 %
MCH: 26.6 pg (ref 26.0–34.0)
MCHC: 30.1 g/dL (ref 30.0–36.0)
MCV: 88.4 fL (ref 78.0–100.0)
MONO ABS: 0.9 10*3/uL (ref 0.1–1.0)
Monocytes Relative: 4 %
Neutro Abs: 21.3 10*3/uL — ABNORMAL HIGH (ref 1.7–7.7)
Neutrophils Relative %: 90 %
PLATELETS: 632 10*3/uL — AB (ref 150–400)
RBC: 3.19 MIL/uL — AB (ref 3.87–5.11)
RDW: 17.1 % — AB (ref 11.5–15.5)
WBC: 23.6 10*3/uL — AB (ref 4.0–10.5)

## 2017-07-29 LAB — BASIC METABOLIC PANEL
Anion gap: 6 (ref 5–15)
BUN: 6 mg/dL (ref 6–20)
CO2: 20 mmol/L — ABNORMAL LOW (ref 22–32)
CREATININE: 1.11 mg/dL — AB (ref 0.44–1.00)
Calcium: 7.6 mg/dL — ABNORMAL LOW (ref 8.9–10.3)
Chloride: 113 mmol/L — ABNORMAL HIGH (ref 101–111)
GFR calc Af Amer: 59 mL/min — ABNORMAL LOW (ref >60)
GFR, EST NON AFRICAN AMERICAN: 51 mL/min — AB (ref >60)
GLUCOSE: 99 mg/dL (ref 65–99)
POTASSIUM: 4.7 mmol/L (ref 3.5–5.1)
SODIUM: 139 mmol/L (ref 135–145)

## 2017-07-29 LAB — MAGNESIUM: MAGNESIUM: 2.4 mg/dL (ref 1.7–2.4)

## 2017-07-29 LAB — URINALYSIS, ROUTINE W REFLEX MICROSCOPIC
Bilirubin Urine: NEGATIVE
GLUCOSE, UA: NEGATIVE mg/dL
KETONES UR: NEGATIVE mg/dL
Nitrite: POSITIVE — AB
PH: 5.5 (ref 5.0–8.0)
PROTEIN: NEGATIVE mg/dL
Specific Gravity, Urine: 1.01 (ref 1.005–1.030)

## 2017-07-29 LAB — URINALYSIS, MICROSCOPIC (REFLEX)

## 2017-07-29 LAB — CBC
HCT: 26.4 % — ABNORMAL LOW (ref 36.0–46.0)
Hemoglobin: 8 g/dL — ABNORMAL LOW (ref 12.0–15.0)
MCH: 26.8 pg (ref 26.0–34.0)
MCHC: 30.3 g/dL (ref 30.0–36.0)
MCV: 88.3 fL (ref 78.0–100.0)
PLATELETS: 519 10*3/uL — AB (ref 150–400)
RBC: 2.99 MIL/uL — ABNORMAL LOW (ref 3.87–5.11)
RDW: 17.6 % — AB (ref 11.5–15.5)
WBC: 18 10*3/uL — ABNORMAL HIGH (ref 4.0–10.5)

## 2017-07-29 LAB — GLUCOSE, CAPILLARY
GLUCOSE-CAPILLARY: 120 mg/dL — AB (ref 65–99)
GLUCOSE-CAPILLARY: 142 mg/dL — AB (ref 65–99)
Glucose-Capillary: 91 mg/dL (ref 65–99)
Glucose-Capillary: 107 mg/dL — ABNORMAL HIGH (ref 65–99)

## 2017-07-29 NOTE — Progress Notes (Signed)
Flexiseal leaking large amount of stool> removed and reinserted, balloon inflated with 30ml of water. Bag changed. No further leaking noted.

## 2017-07-29 NOTE — Progress Notes (Signed)
PROGRESS NOTE    Lori [name]  AXK:[id number] DOB: 05/30/1951 DOA: 06/23/2017 PCP: [name], MD   Chief Complaint  Patient presents with  . 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 She was at a skilled nursing facility. She was recently hospitalized for small bowel obstruction and underwent diagnostic laparoscopy in June. She underwent repair of small bowel perforation. She had a prolonged hospital stay. Subsequently was discharged to skilled nursing facility. Presented back with rectal bleeding. Seen by oncology. Not thought to be a candidate for any treatment. Then she had what appears to be a seizure resulting in encephalopathy. Neurology was consulted. Palliative medicine continues to follow. Family still desires aggressive care. Patient was transferred to stepdown unit. Critical care medicine was consulted. Patient noted to have profuse urine output. 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 Patient improving. Had MBS, started on dysphagia 2 diet however still poor oral intake, therefore NGT still in place. Patient transitioned to oral Keppra, Tegretol. Mental status improving. Neurology felt no need for lumbar puncture as mental status has been improving.  Assessment & Plan   Severe sepsis secondary to pneumatosis and bowel ischemia -Placed on vancomycin and Zosyn initially however vancomycin was discontinued as there is no evidence of MRSA -Patient still not candidate for surgery per general surgery -Blood cultures show no growth to date continue IV fluids -Surgery recommended medical management and continuing oral antibiotics for additional 5 days (when able to tolerate diet) -NG tube removed -leukocytosis had improved, but  now worsening. Feel this is likely reactive. Currently on zosyn. Will obtain repeat UA, CXR, and monitor   Breakthrough seizure/history of seizure disorder -Neurology consulted and appreciated -Patient placed on IV Keppra and Vimpat -Neurology recommended continuing keppra 1g BID, Vimpat 121m BID  -Discussed with Dr. ARory Percy will discontinue tegretol (initally held as patient was NPO after seizure activity, and it was initially thought that she may not have been compliant) -CT head on 07/21/17: no definite CT evidence for acute intracranial abnormality  -Tegretol discontinued -Continue seizure precautions  Acute metabolic encephalopathy/Left Thalamic Stroke/ Bilateral small SDH -Patient was noted to have garbled speech on the afternoon of 06/28/2017 -Neurology consultation appreciated -CT head on 8/1: Evolving encephalomalacia at suspected left thalamic lacunar infarct. Previously identified tiny bilateral subdural hematoma -MRI brain on 7/29: New tiny bilateral subdural hematomas, trace subarachnoid hemorrhage, punctate focus of diffusion abnormality in the dorsal thalamus -It was thought the patient was having seizure activity and she was loaded with Keppra -Patient has had continuous EEG monitoring: Progressively improving diffuse encephalopathy  -Neurology feels lumbar puncture is no longer necessary given that her mental status is improving -Patient had modified barium swallow, was placed on dysphagia 2 diet, however was made NPO due to seizure  -Continues to have improved mental status, currently AAOx2 -transitioned to dysphagia 2 diet  Diabetes insipidus/polyuria/Hypernatremia -Patient had excessive urine output thought to be due to central diabetes insipidus due to abnormalities noted on MRI of the brain -Resolved however patient was then placed on IV fluids after seizure episode. -Sodium down to 139 -discontinued D5 -Continue to monitor urine output, over the past 24 hours  575cc  Primary appendiceal carcinoma with metastatic disease, stage IV with abdominal wound -Oncology consultation appreciated, patient is not a candidate for chemotherapy and is thought to  have an incurable condition. Poor prognosis -Palliative care was consulted. 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 surgery consulted and following- recommended TID wet to dry dressing changes -Of note, patient completed course of IV Unasyn which was used to treat urinary tract infection and covered her abdominal abscess  Rectal bleeding of unknown cause -Appears to be stable, patient is not a candidate for endoscopic evaluation due to recent surgery. This was discussed by previous hospitalist with general surgery.  Anemia secondary to acute blood loss -Hemoglobin currently 8, appears stable -patient was transfused 1uPRBC on 07/23/2017 -transfuse if hemoglobin <7 -Continue to monitor CBC  Diabetes mellitus, type II -Continue insulin sliding scale CBG monitoring  Essential hypertension -Continue to remain stable -Continue clonidine, and PRN hydralazine  Enterococcus UTI -resolved, treated with unasyn  Nutrition -Continues to be encephalopathic with poor oral intake -Was seen by nutrition/speech therapy and placed on dysphagia diet however patient did have a seizure -NG tube was removed -Likely not a good candidate for PEG tube given her carcinomatosis -transitioned to dysphagia II diet   Hypokalemia/hypomagnesemia -resolved, potassium 4.7, magnesium 2.4 -Continue to monitor BMP and Magnesium   Stage II pressure ulcer of the buttocks -Continue wound care  Goals of care -Palliative care was consulted and appreciated however family wishes to continue aggressive care -Feel patient has an overall poor prognosis given her poor oral intake, seizure activity, multiple comorbidities, stage IV  appendiceal cancer -Trying to coordinate family meeting- unfortunately, family has not returned phone call to palliative care -Discussed with son today, will transition patient to soft diet. If she is able to tolerate, will likely discharge patient to SNF on 8/26.   DVT Prophylaxis  SCDs  Code Status: Full  Family Communication: Son at bedside (sleeping)  Disposition Plan: Admitted. Continue to transition diet- now on dysphagia II. Pending repeat workup for increased WBC. Possibly discharge to SNF in 1-2 days  [place] [facility] Neu[facility]  Oncology Palliative care  Procedures  Continuous EEG  Antibiotics   Anti-infectives    Start     Dose/Rate Route Frequency Ordered Stop   07/22/17 0500  vancomycin (VANCOCIN) IVPB 1000 mg/200 mL premix  Status:  Discontinued     1,000 mg 200 mL/hr over 60 Minutes Intravenous Every 12 hours 07/21/17 1545 07/24/17 1047   07/21/17 2200  piperacillin-tazobactam (ZOSYN) IVPB 3.375 g     3.375 g 12.5 mL/hr over 240 Minutes Intravenous Every 8 hours 07/21/17 1545     07/21/17 1630  vancomycin (VANCOCIN) 1,500 mg in sodium chloride 0.9 % 500 mL IVPB     1,500 mg 250 mL/hr over 120 Minutes Intravenous  Once 07/21/17 1545 07/22/17 0000   07/21/17 1500  piperacillin-tazobactam (ZOSYN) IVPB 3.375 g     3.375 g 100 mL/hr over 30 Minutes Intravenous  Once 07/21/17 1449 07/21/17 1638   07/04/17 1100  Ampicillin-Sulbactam (UNASYN) 3 g in sodium chloride 0.9 % 100 mL IVPB     3 g 200 mL/hr over 30 Minutes Intravenous Every 6 hours 07/04/17 0823 07/10/17 0429   07/03/17 1600  Ampicillin-Sulbactam (UNASYN) 3 g in sodium chloride 0.9 % 100 mL IVPB  Status:  Discontinued     3 g 200 mL/hr over 30 Minutes Intravenous Every 6 hours 07/03/17 1047 07/04/17 0823   06/29/17 1400  ceFEPIme (MAXIPIME) 2 g in dextrose 5 % 50 mL IVPB  Status:  Discontinued     2  g 100 mL/hr over 30 Minutes Intravenous Every 8 hours 06/29/17 0853 06/29/17 0857    06/29/17 1000  ceFEPIme (MAXIPIME) 2 g in dextrose 5 % 50 mL IVPB  Status:  Discontinued     2 g 100 mL/hr over 30 Minutes Intravenous Every 8 hours 06/29/17 0857 07/03/17 1046   06/29/17 0930  metroNIDAZOLE (FLAGYL) IVPB 500 mg  Status:  Discontinued     500 mg 100 mL/hr over 60 Minutes Intravenous Every 8 hours 06/29/17 0838 06/30/17 0956   06/24/17 0600  piperacillin-tazobactam (ZOSYN) IVPB 3.375 g  Status:  Discontinued     3.375 g 12.5 mL/hr over 240 Minutes Intravenous Every 8 hours 06/23/17 2254 06/29/17 0838   06/23/17 2300  piperacillin-tazobactam (ZOSYN) IVPB 3.375 g     3.375 g 100 mL/hr over 30 Minutes Intravenous  Once 06/23/17 2254 06/24/17 0056      Subjective:   Lori [name] seen and examined today. Has no complaints today. States she does not like mac and cheese (part of soft diet). Wants cereal. Denies chest pain, shortness of breath, abdominal pain, headache.    Objective:   Vitals:   07/28/17 1842 07/28/17 2231 07/29/17 0649 07/29/17 1300  BP: (!) 160/95 (!) 155/90 (!) 148/82 140/82  Pulse:  (!) 108 99 (!) 103  Resp:  _0 Temp:  98.7 F (37.1 C) 98.3 F (36.8 C) 98.1 F (36.7 C)  TempSrc:  Oral  Oral  SpO2:  100% 99% 97%  Weight:   99.6 kg (219 lb 10.3 oz)   Height:        Intake/Output Summary (Last 24 hours) at 07/29/17 1502 Last data filed at 07/29/17 1300  Gross per 24 hour  Intake              955 ml  Output             1675 ml  Net             -720 ml   Filed Weights   07/27/17 0500 07/27/17 1637 07/29/17 0649  Weight: 93.9 kg (207 lb 0.2 oz) 100.7 kg (222 lb 1.6 oz) 99.6 kg (219 lb 10.3 oz)   Exam  General: Well developed, ill appearing, NAD  HEENT: NCAT, mucous membranes moist.   Cardiovascular: S1 S2 auscultated, RRR  Respiratory: Clear to auscultation bilaterally with equal chest rise  Abdomen: Soft, nontender, nondistended, + bowel sounds, Dressing in palce  Extremities: warm dry without cyanosis clubbing. Mild LE  edema  Neuro: AAOx3, nonfocal  Psych: pleasant and appropriate  Data Reviewed: I have personally reviewed following labs and imaging studies  CBC:  Recent Labs Lab 07/23/17 0625 07/24/17 0500 07/25/17 1520 07/26/17 0242 07/27/17 0457 07/28/17 0433 07/29/17 0358  WBC 15.0* 15.1* 11.0* 10.7* 9.7 13.5* 18.0*  NEUTROABS 13.1* 13.3* 9.0*  --   --   --   --   HGB 6.6* 7.2* 8.0* 7.8* 7.5* 7.7* 8.0*  HCT 22.2* 23.9* 25.7* 25.9* 25.1* 25.1* 26.4*  MCV 88.8 88.2 88.3 87.2 86.9 86.6 88.3  PLT 388 407* 443* 439* 440* 477* 696*   Basic Metabolic Panel:  Recent Labs Lab 07/24/17 0500 07/25/17 1520 07/26/17 1341 07/27/17 0457 07/28/17 0433 07/29/17 0358  NA 151* 148* 144 143 139 139  K 3.0* 2.4* 3.2* 3.0* 3.7 4.7  CL 122* 120* 116* 113* 112* 113*  CO2 _1 21* 20*  GLUCOSE 132* 110* 148* 130* 111* 99  BUN 19 9 7  6 5* 6  CREATININE 0.63 0.52 0.61 0.56 0.69 1.11*  CALCIUM 7.7* 7.4* 7.3* 7.6* 7.4* 7.6*  MG 1.7 1.4*  --  1.6* 1.6* 2.4   GFR: Estimated Creatinine Clearance: 61.5 mL/min (A) (by C-G formula based on SCr of 1.11 mg/dL (H)). Liver Function Tests:  Recent Labs Lab 07/23/17 0625 07/24/17 0500 07/25/17 1520  AST _0 ALT 13* 11* 11*  ALKPHOS 119 118 98  BILITOT 0.5 0.8 0.4  PROT 5.0* 5.0* 4.9*  ALBUMIN 1.2* 1.1* 1.2*   No results for input(s): LIPASE, AMYLASE in the last 168 hours. No results for input(s): AMMONIA in the last 168 hours. Coagulation Profile: No results for input(s): INR, PROTIME in the last 168 hours. Cardiac Enzymes: No results for input(s): CKTOTAL, CKMB, CKMBINDEX, TROPONINI in the last 168 hours. BNP (last 3 results) No results for input(s): PROBNP in the last 8760 hours. HbA1C: No results for input(s): HGBA1C in the last 72 hours. CBG:  Recent Labs Lab 07/28/17 1255 07/28/17 1815 07/28/17 2225 07/29/17 0752 07/29/17 1209  GLUCAP 106* 114* 99 91 107*   Lipid Profile: No results for input(s): CHOL, HDL, LDLCALC,  TRIG, CHOLHDL, LDLDIRECT in the last 72 hours. Thyroid Function Tests: No results for input(s): TSH, T4TOTAL, FREET4, T3FREE, THYROIDAB in the last 72 hours. Anemia Panel: No results for input(s): VITAMINB12, FOLATE, FERRITIN, TIBC, IRON, RETICCTPCT in the last 72 hours. Urine analysis:    Component Value Date/Time   COLORURINE AMBER (A) 07/21/2017 1552   APPEARANCEUR HAZY (A) 07/21/2017 1552   LABSPEC 1.024 07/21/2017 1552   LABSPEC 1.010 08/17/2015 1542   PHURINE 5.0 07/21/2017 1552   GLUCOSEU NEGATIVE 07/21/2017 1552   GLUCOSEU 100 08/17/2015 1542   HGBUR SMALL (A) 07/21/2017 1552   BILIRUBINUR NEGATIVE 07/21/2017 1552   BILIRUBINUR Negative 08/17/2015 1542   KETONESUR NEGATIVE 07/21/2017 1552   PROTEINUR 30 (A) 07/21/2017 1552   UROBILINOGEN 1.0 08/26/2015 0725   UROBILINOGEN 0.2 08/17/2015 1542   NITRITE NEGATIVE 07/21/2017 1552   LEUKOCYTESUR SMALL (A) 07/21/2017 1552   LEUKOCYTESUR Trace 08/17/2015 1542   Sepsis Labs: _1 (procalcitonin:4,lacticidven:4)  ) Recent Results (from the past 240 hour(s))  C difficile quick scan w PCR reflex     Status: None   Collection Time: 07/20/17 12:40 PM  Result Value Ref Range Status   C Diff antigen NEGATIVE NEGATIVE Final   C Diff toxin NEGATIVE NEGATIVE Final   C Diff interpretation No C. difficile detected.  Final  Gastrointestinal Panel by PCR , Stool     Status: None   Collection Time: 07/20/17 12:40 PM  Result Value Ref Range Status   Campylobacter species NOT DETECTED NOT DETECTED Final   Plesimonas shigelloides NOT DETECTED NOT DETECTED Final   Salmonella species NOT DETECTED NOT DETECTED Final   Yersinia enterocolitica NOT DETECTED NOT DETECTED Final   Vibrio species NOT DETECTED NOT DETECTED Final   Vibrio cholerae NOT DETECTED NOT DETECTED Final   Enteroaggregative E coli (EAEC) NOT DETECTED NOT DETECTED Final   Enteropathogenic E coli (EPEC) NOT DETECTED NOT DETECTED Final   Enterotoxigenic E coli (ETEC) NOT  DETECTED NOT DETECTED Final   Shiga like toxin producing E coli (STEC) NOT DETECTED NOT DETECTED Final   Shigella/Enteroinvasive E coli (EIEC) NOT DETECTED NOT DETECTED Final   Cryptosporidium NOT DETECTED NOT DETECTED Final   Cyclospora cayetanensis NOT DETECTED NOT DETECTED Final   Entamoeba histolytica NOT DETECTED NOT DETECTED Final   Giardia lamblia NOT DETECTED NOT DETECTED Final  Adenovirus F40/41 NOT DETECTED NOT DETECTED Final   Astrovirus NOT DETECTED NOT DETECTED Final   Norovirus GI/GII NOT DETECTED NOT DETECTED Final   Rotavirus A NOT DETECTED NOT DETECTED Final   Sapovirus (I, II, IV, and V) NOT DETECTED NOT DETECTED Final  Culture, Urine     Status: Abnormal   Collection Time: 07/21/17  3:51 PM  Result Value Ref Range Status   Specimen Description URINE, CATHETERIZED  Final   Special Requests NONE  Final   Culture MULTIPLE SPECIES PRESENT, SUGGEST RECOLLECTION (A)  Final   Report Status 07/23/2017 FINAL  Final  Culture, blood (x 2)     Status: None   Collection Time: 07/21/17  4:25 PM  Result Value Ref Range Status   Specimen Description BLOOD RIGHT HAND  Final   Special Requests   Final    BOTTLES DRAWN AEROBIC ONLY Blood Culture results may not be optimal due to an inadequate volume of blood received in culture bottles   Culture NO GROWTH 5 DAYS  Final   Report Status 07/26/2017 FINAL  Final  Culture, blood (x 2)     Status: None   Collection Time: 07/21/17  6:00 PM  Result Value Ref Range Status   Specimen Description BLOOD RIGHT ARM  Final   Special Requests   Final    BOTTLES DRAWN AEROBIC AND ANAEROBIC Blood Culture adequate volume   Culture NO GROWTH 5 DAYS  Final   Report Status 07/26/2017 FINAL  Final      Radiology Studies: Dg Chest Port 1 View  Result Date: 07/29/2017 CLINICAL DATA:  Leukocytosis. EXAM: PORTABLE CHEST 1 VIEW COMPARISON:  07/25/2017 and CT 07/21/2017 FINDINGS: Right jugular Port-A-Cath is present. Catheter tip is near the superior  cavoatrial junction. Again noted is mild elevation of the right hemidiaphragm. Persistent hazy densities at the right lung base and cannot exclude a small right pleural effusion. Upper lungs are clear. Heart size is slightly enlarged but stable. Negative for a pneumothorax. IMPRESSION: Persistent subtle densities at right lung base. Findings may represent volume loss and small effusion. No significant change from the recent comparison examination. Electronically Signed   By: Markus Daft M.D.   On: 07/29/2017 09:40     Scheduled Meds: . feeding supplement (GLUCERNA SHAKE)  237 mL Oral TID BM  . folic acid  1 mg Per Tube Daily  . insulin aspart  0-15 Units Subcutaneous TID WC  . insulin aspart  0-5 Units Subcutaneous QHS  . magnesium chloride  1 tablet Oral Daily  . mirtazapine  7.5 mg Oral QHS  . multivitamin with minerals  1 tablet Oral Daily  . potassium chloride  40 mEq Oral BID   Continuous Infusions: . sodium chloride    . lacosamide (VIMPAT) IV Stopped (07/29/17 1137)  . levETIRAcetam Stopped (07/29/17 1101)  . piperacillin-tazobactam (ZOSYN)  IV 3.375 g (07/29/17 1441)     LOS: 36 days   Time Spent in minutes   30 minutes  Mattias Walmsley D.O. on 07/29/2017 at 3:02 PM  Between 7am to 7pm - Pager - [phone]  After 7pm go to [email or web address] - password [email or web address]  And look for the night coverage person covering for me after hours  [facility] Office  [phone]

## 2017-07-30 DIAGNOSIS — N179 Acute kidney failure, unspecified: Secondary | ICD-10-CM

## 2017-07-30 LAB — URINE CULTURE

## 2017-07-30 LAB — CBC WITH DIFFERENTIAL/PLATELET
Basophils Absolute: 0 10*3/uL (ref 0.0–0.1)
Basophils Relative: 0 %
EOS PCT: 0 %
Eosinophils Absolute: 0 10*3/uL (ref 0.0–0.7)
HCT: 25.1 % — ABNORMAL LOW (ref 36.0–46.0)
Hemoglobin: 7.5 g/dL — ABNORMAL LOW (ref 12.0–15.0)
LYMPHS ABS: 1.1 10*3/uL (ref 0.7–4.0)
Lymphocytes Relative: 5 %
MCH: 26.7 pg (ref 26.0–34.0)
MCHC: 29.9 g/dL — AB (ref 30.0–36.0)
MCV: 89.3 fL (ref 78.0–100.0)
MONO ABS: 0.9 10*3/uL (ref 0.1–1.0)
Monocytes Relative: 4 %
Neutro Abs: 20.8 10*3/uL — ABNORMAL HIGH (ref 1.7–7.7)
Neutrophils Relative %: 91 %
PLATELETS: 590 10*3/uL — AB (ref 150–400)
RBC: 2.81 MIL/uL — AB (ref 3.87–5.11)
RDW: 17.4 % — ABNORMAL HIGH (ref 11.5–15.5)
WBC: 22.8 10*3/uL — AB (ref 4.0–10.5)

## 2017-07-30 LAB — BASIC METABOLIC PANEL
Anion gap: 5 (ref 5–15)
Anion gap: 6 (ref 5–15)
BUN: 14 mg/dL (ref 6–20)
BUN: 20 mg/dL (ref 6–20)
CALCIUM: 7.5 mg/dL — AB (ref 8.9–10.3)
CALCIUM: 7.4 mg/dL — AB (ref 8.9–10.3)
CO2: 21 mmol/L — AB (ref 22–32)
CO2: 19 mmol/L — AB (ref 22–32)
CREATININE: 1.43 mg/dL — AB (ref 0.44–1.00)
CREATININE: 1.68 mg/dL — AB (ref 0.44–1.00)
Chloride: 113 mmol/L — ABNORMAL HIGH (ref 101–111)
Chloride: 113 mmol/L — ABNORMAL HIGH (ref 101–111)
GFR calc non Af Amer: 31 mL/min — ABNORMAL LOW (ref >60)
GFR, EST AFRICAN AMERICAN: 43 mL/min — AB (ref >60)
GFR, EST AFRICAN AMERICAN: 36 mL/min — AB (ref >60)
GFR, EST NON AFRICAN AMERICAN: 37 mL/min — AB (ref >60)
GLUCOSE: 117 mg/dL — AB (ref 65–99)
Glucose, Bld: 153 mg/dL — ABNORMAL HIGH (ref 65–99)
Potassium: 5.5 mmol/L — ABNORMAL HIGH (ref 3.5–5.1)
Potassium: 4.8 mmol/L (ref 3.5–5.1)
SODIUM: 139 mmol/L (ref 135–145)
Sodium: 138 mmol/L (ref 135–145)

## 2017-07-30 LAB — GLUCOSE, CAPILLARY
GLUCOSE-CAPILLARY: 134 mg/dL — AB (ref 65–99)
GLUCOSE-CAPILLARY: 109 mg/dL — AB (ref 65–99)
GLUCOSE-CAPILLARY: 99 mg/dL (ref 65–99)
Glucose-Capillary: 121 mg/dL — ABNORMAL HIGH (ref 65–99)

## 2017-07-30 LAB — MAGNESIUM: Magnesium: 2.3 mg/dL (ref 1.7–2.4)

## 2017-07-30 MED ORDER — FLUCONAZOLE IN SODIUM CHLORIDE 200-0.9 MG/100ML-% IV SOLN
200.0000 mg | INTRAVENOUS | Status: DC
  Administered 2017-07-30 – 2017-08-03 (×5): 200 mg via INTRAVENOUS
  Filled 2017-07-30 (×6): qty 100

## 2017-07-30 MED ORDER — SODIUM CHLORIDE 0.9 % IV SOLN
3.0000 g | Freq: Three times a day (TID) | INTRAVENOUS | Status: DC
  Administered 2017-07-30 – 2017-08-05 (×18): 3 g via INTRAVENOUS
  Filled 2017-07-30 (×19): qty 3

## 2017-07-30 MED ORDER — SODIUM CHLORIDE 0.9 % IV SOLN
INTRAVENOUS | Status: DC
  Administered 2017-07-30 – 2017-08-02 (×3): via INTRAVENOUS

## 2017-07-30 NOTE — Progress Notes (Signed)
Physical Therapy Treatment Patient Details Name: Lori [name] MRN: [id number] DOB: [birth year] Today's Date: 07/30/2017    History of Present Illness pt is a 66 y/o female with pmh significant for DM , Seizure d/o HTN, h/o brain aneurysm, cervical CA, metastatic mucinous adenocarcinoma of the appendix, admitted from SNF with c/o bloody bowel movements.  In hospital pt appeared to have seizure resulting in encephalopathy.  CT/MRI's suggest L thalamic, L cerebellar and right frontal lobe encephalomalacia    PT Comments    Pt performed Supine B LE exercises to improve strength.  Pt supine in bed and IV nurse waiting to change IV PICC line site therefore therapy limited to bed level.  Pt remains to require max VCs to follow commands for participation with bed level exercises.  Pt with stool incontinence during session as well.  RN informed and aware of BM.  Pt continues to progress slowly with PT.  Cognition remains impaired during session.  Plan for SNF remains appropriate.     Follow Up Recommendations  SNF;Supervision/Assistance - 24 hour     Equipment Recommendations  Wheelchair (measurements PT)    Recommendations for Other Services       Precautions / Restrictions Precautions Precautions: Fall Precaution Comments: rectal tube, foley Restrictions Weight Bearing Restrictions: No    Mobility  Bed Mobility               General bed mobility comments: Deferred OOB as IV nurse waiting to change PICC line site.    Transfers                    Ambulation/Gait                 Stairs            Wheelchair Mobility    Modified Rankin (Stroke Patients Only)       Balance                                            Cognition Arousal/Alertness: Awake/alert Behavior During Therapy: WFL for tasks assessed/performed Overall Cognitive Status: Impaired/Different from baseline Area of Impairment: Following commands                   Current Attention Level: Sustained   Following Commands: Follows one step commands with increased time Safety/Judgement: Decreased awareness of deficits;Decreased awareness of safety Awareness: Intellectual Problem Solving: Requires verbal cues;Requires tactile cues General Comments: Pt reports that she is in a house but calls the house [facility].  Pt also reports her sister lives downstairs but later requests for therapist to knock on the wall to get her sister's attention.        Exercises General Exercises - Lower Extremity Ankle Circles/Pumps: AROM;10 reps;Supine;Both Quad Sets: AROM;Both;Supine;10 reps (better response to LLE than R (trace movement on R with quad sets but later able to perform AROM SAQ on R) ) Short Arc Quad: AROM;Both;10 reps Heel Slides: AAROM;Both;10 reps;Supine;PROM Hip ABduction/ADduction: AAROM;Both;10 reps;Supine Other Exercises Other Exercises: IR B hips 1x10 reps.      General Comments        Pertinent Vitals/Pain Pain Assessment: No/denies pain    Home Living                      Prior Function  PT Goals (current goals can now be found in the care plan section) Acute Rehab PT Goals Patient Stated Goal: to get stronger PT Goal Formulation: With patient Time For Goal Achievement: 08/06/17 Potential to Achieve Goals: Fair Progress towards PT goals: Progressing toward goals    Frequency    Min 2X/week      PT Plan Current plan remains appropriate    Co-evaluation              AM-PAC PT "6 Clicks" Daily Activity  Outcome Measure  Difficulty turning over in bed (including adjusting bedclothes, sheets and blankets)?: Unable Difficulty moving from lying on back to sitting on the side of the bed? : Unable Difficulty sitting down on and standing up from a chair with arms (e.g., wheelchair, bedside commode, etc,.)?: Unable Help needed moving to and from a bed to chair (including a wheelchair)?: Total Help  needed walking in hospital room?: Total Help needed climbing 3-5 steps with a railing? : Total 6 Click Score: 6    End of Session Equipment Utilized During Treatment: Gait belt Activity Tolerance: Patient limited by fatigue Patient left: in bed;with call bell/phone within reach Nurse Communication: Mobility status;Need for lift equipment PT Visit Diagnosis: Other abnormalities of gait and mobility (R26.89);Other symptoms and signs involving the nervous system (R29.898);Muscle weakness (generalized) (M62.81) Pain - Right/Left: Left Pain - part of body:  (abdomen)     Time: 1031-5945 PT Time Calculation (min) (ACUTE ONLY): 18 min  Charges:  $Therapeutic Exercise: 8-22 mins                    G Codes:       Governor Rooks, PTA pager [phone]    Cristela Blue 07/30/2017, 1:08 PM

## 2017-07-30 NOTE — Progress Notes (Signed)
Occupational Therapy Treatment Patient Details Name: Lori [name] MRN: [id number] DOB: [birth year] Today's Date: 07/30/2017    History of present illness pt is a 66 y/o female with pmh significant for DM , Seizure d/o HTN, h/o brain aneurysm, cervical CA, metastatic mucinous adenocarcinoma of the appendix, admitted from SNF with c/o bloody bowel movements.  In hospital pt appeared to have seizure resulting in encephalopathy.  CT/MRI's suggest L thalamic, L cerebellar and right frontal lobe encephalomalacia   OT comments  Pt continues to demonstrate decreased functional performance. Pt performed oral care at bed level with HOB elevated to simulate chair position. Pt required hand over hand for bilateral tasks and incorporate LUE. Pt requiring verbal and tactile cues for problem solving and awareness. Pt performed AAROM/PROM of BUEs while seated upright in bed. Continues to recommend dc to SNF for further OT to optimize safety and independence with ADLs and functional mobility.  Will continues to follow acutely to facilitate safe dc.    Follow Up Recommendations  SNF;Supervision/Assistance - 24 hour    Equipment Recommendations  None recommended by OT    Recommendations for Other Services      Precautions / Restrictions Precautions Precautions: Fall Precaution Comments: rectal tube, foley Restrictions Weight Bearing Restrictions: No       Mobility Bed Mobility Overal bed mobility: Needs Assistance             General bed mobility comments: Pt declined any OOB activity. NT reports that pt required total A for rollign side to side.   Transfers                 General transfer comment: Declined to perform OOB activity    Balance                                           ADL either performed or assessed with clinical judgement   ADL Overall ADL's : Needs assistance/impaired     Grooming: Bed level;Minimal assistance;Cueing for sequencing;Oral  care Grooming Details (indicate cue type and reason): Optimize sitting position at bed level by using chair position. Pt requiring Min A to problem solve task. Hnad over hand provided to perform bilateral tasks and use LUE.  Placed LUE on table for WBing and used as gross stabilizer with handoverhand A                               General ADL Comments: Pt performed grooming at bed level with Min A . Pt continues to require Max A for bathing and dressing.      Vision       Perception     Praxis      Cognition Arousal/Alertness: Awake/alert Behavior During Therapy: Flat affect Overall Cognitive Status: Impaired/Different from baseline Area of Impairment: Following commands;Problem solving;Awareness;Safety/judgement                   Current Attention Level: Sustained   Following Commands: Follows one step commands with increased time Safety/Judgement: Decreased awareness of deficits;Decreased awareness of safety Awareness: Intellectual Problem Solving: Requires verbal cues;Requires tactile cues;Slow processing General Comments: Pt oriented to time and self. Pt demonstrating decreased problem solving and awareness during ADLs at bed level. Pt requiring Min A and Mod VCs to orient tooth brush and manage cup. Throughout session, pt requiring Max  encouragement to participate.        Exercises Exercises: Other exercises;General Upper Extremity General Exercises - Upper Extremity Shoulder Flexion: Right;Left;10 reps;Supine;AAROM Shoulder ABduction: AAROM;Both;10 reps;Supine Elbow Flexion: AROM;Both;10 reps;Supine Elbow Extension: AROM;Both;5 reps;Supine Wrist Flexion: AAROM;Both;10 reps;Supine Wrist Extension: AAROM;Both;10 reps;Supine **LUE requiring PROM due to weakness and pt's decreased participation/motivation**   Shoulder Instructions       General Comments      Pertinent Vitals/ Pain       Pain Assessment: Faces Faces Pain Scale: Hurts a little  bit Pain Location: abdomen and LEs Pain Descriptors / Indicators: Aching;Grimacing;Guarding Pain Intervention(s): Limited activity within patient's tolerance;Monitored during session;Repositioned  Home Living                                          Prior Functioning/Environment              Frequency  Min 2X/week        Progress Toward Goals  OT Goals(current goals can now be found in the care plan section)  Progress towards OT goals: Progressing toward goals  Acute Rehab OT Goals Patient Stated Goal: to get stronger OT Goal Formulation: With patient Time For Goal Achievement: 08-11-17 Potential to Achieve Goals: Fair ADL Goals Pt Will Perform Eating: with max assist;bed level;sitting (with assist to scoop and proximal support) Pt Will Perform Grooming: with set-up;with supervision;bed level (was face and wash hands) Pt Will Perform Upper Body Bathing: with mod assist;sitting;bed level Pt/caregiver will Perform Home Exercise Program: Increased ROM;Increased strength;Both right and left upper extremity;With minimal assist (AROM and AAROM) Additional ADL Goal #1: Pt will be able to sit EOB with Mod A  for 5 minutes to work on sitting balance Additional ADL Goal #2: Pt will be Max A to roll left or right to A with basic ADLs  Plan Discharge plan remains appropriate    Co-evaluation                 AM-PAC PT "6 Clicks" Daily Activity     Outcome Measure   Help from another person eating meals?: A Little (if postioned properly and set up) Help from another person taking care of personal grooming?: A Lot Help from another person toileting, which includes using toliet, bedpan, or urinal?: Total Help from another person bathing (including washing, rinsing, drying)?: Total Help from another person to put on and taking off regular upper body clothing?: Total Help from another person to put on and taking off regular lower body clothing?: Total 6  Click Score: 9    End of Session    OT Visit Diagnosis: Other abnormalities of gait and mobility (R26.89);Muscle weakness (generalized) (M62.81);Other symptoms and signs involving cognitive function   Activity Tolerance Patient limited by fatigue   Patient Left in bed;with call bell/phone within reach   Nurse Communication Mobility status        Time: 8250-5397 OT Time Calculation (min): 27 min  Charges: OT General Charges $OT Visit: 1 Visit OT Treatments $Self Care/Home Management : 23-37 mins  Anna, OTR/L Acute Rehab Pager: [phone] Office: [place] 07/30/2017, 3:48 PM

## 2017-07-30 NOTE — Progress Notes (Addendum)
PROGRESS NOTE    Lori [name]  QPR:[id number] DOB: [birth year] DOA: 06/23/2017 PCP: [name], MD   Chief Complaint  Patient presents with  . 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 She was at a skilled nursing facility. She was recently hospitalized for small bowel obstruction and underwent diagnostic laparoscopy in June. She underwent repair of small bowel perforation. She had a prolonged hospital stay. Subsequently was discharged to skilled nursing facility. Presented back with rectal bleeding. Seen by oncology. Not thought to be a candidate for any treatment. Then she had what appears to be a seizure resulting in encephalopathy. Neurology was consulted. Palliative medicine continues to follow. Family still desires aggressive care. Patient was transferred to stepdown unit. Critical care medicine was consulted. Patient noted to have profuse urine output. 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 Patient improving. Had MBS, started on dysphagia 2 diet however still poor oral intake, therefore NGT still in place. Patient transitioned to oral Keppra, Tegretol. Mental status improving. Neurology felt no need for lumbar puncture as mental status has been improving.  Patient's WBC/renal function now worsening again. Will place on IVF.   Assessment & Plan   Severe sepsis secondary to pneumatosis and bowel ischemia -Placed on vancomycin and Zosyn initially however vancomycin was discontinued as there is no evidence of MRSA -Patient still not candidate for surgery per general surgery -Blood cultures show no growth to date continue IV fluids -Surgery recommended medical management and continuing oral antibiotics for additional 5 days (when  able to tolerate diet) -NG tube removed -leukocytosis had improved, but now worsening. Feel this is likely reactive. Currently on zosyn.  -given worsening in creatinine will transition to unasyn   Worsening leukocytosis -Thought to be secondary to the above however patient is on Zosyn -Obtained repeat chest x-ray on 8/26: Unchanged from prior chest x-rays -UA 07/29/2017 shows a cloudy appearance with moderate leukocytes positive nitrites, many bacteria and TNTC WBC, 6-30 sq epi cells, budding yeast (different from prior UAs this admission, patient was treated for Enterococcus UTI with unasyn) -Urine culture pending -Repeat Blood cultures pending -currently afebrile -will transition to unasyn and start on fluconazole  -If no improvement, will recheck for Cdiff as patient has been on several broad spectrum antibiotics during her admission (negative on 8/17)  Acute kidney injury -Will restart gentle IVF  -discontinue zosyn -repeat BMP later today- if no improvement in creatinine over the next couple of days, will obtain renal US and nephrology consult  Breakthrough seizure/history of seizure disorder -Stable -Neurology consulted and appreciated -Patient placed on IV Keppra and Vimpat -Neurology recommended continuing keppra 1g BID, Vimpat 100mg  BID  -Discussed with Dr. Rory Percy, will discontinue tegretol (initally held as patient was NPO after seizure activity, and it was initially thought that she may not have been compliant) -CT head on 07/21/17: no definite CT evidence for acute intracranial abnormality  -Tegretol discontinued -Continue seizure precautions  Acute metabolic encephalopathy/Left Thalamic Stroke/ Bilateral small SDH -Patient was noted to have garbled speech on the afternoon of 06/28/2017 -Neurology consultation appreciated -CT head on 8/1: Evolving encephalomalacia at suspected left thalamic lacunar infarct. Previously identified tiny bilateral subdural hematoma -MRI brain  on 7/29: New tiny bilateral subdural hematomas, trace subarachnoid hemorrhage, punctate focus of diffusion abnormality in the dorsal thalamus -It was thought  the patient was having seizure activity and she was loaded with Keppra -Patient has had continuous EEG monitoring: Progressively improving diffuse encephalopathy  -Neurology feels lumbar puncture is no longer necessary given that her mental status is improving -Patient had modified barium swallow, was placed on dysphagia 2 diet, however was made NPO due to seizure  -Continues to have improved mental status, currently AAOx2 -transitioned to dysphagia 2 diet- tolerated well.   Diabetes insipidus/polyuria/Hypernatremia -Patient had excessive urine output thought to be due to central diabetes insipidus due to abnormalities noted on MRI of the brain -Resolved however patient was then placed on IV fluids after seizure episode. -Sodium down to 139 -discontinued D5 -Continue to monitor urine output, over the past 24 hours 575cc  Primary appendiceal carcinoma with metastatic disease, stage IV with abdominal wound -Oncology consultation appreciated, patient is not a candidate for chemotherapy and is thought to have an incurable condition. Poor prognosis -Palliative care was consulted. 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 surgery consulted and following- recommended TID wet to dry dressing changes -CT on 8/18: significant pneumatosis most consistent with bowel ischemica. Increase in nodular densities in both lungs suspicious for progressive metastatic disease -Of note, patient completed course of IV Unasyn which was used to treat urinary tract infection and covered her abdominal abscess -Discussed with surgery, ?repeat abdominal imaging maybe- however not sure if there would be any different intervention  Rectal bleeding of unknown cause -Appears  to be stable, patient is not a candidate for endoscopic evaluation due to recent surgery. This was discussed by previous hospitalist with general surgery.  Anemia secondary to acute blood loss -Hemoglobin currently 8, appears stable -patient was transfused 1uPRBC on 07/23/2017 -transfuse if hemoglobin <7 -Continue to monitor CBC  Diabetes mellitus, type II -Continue insulin sliding scale CBG monitoring  Essential hypertension -Continue to remain stable -Continue clonidine, and PRN hydralazine  Enterococcus UTI -resolved, treated with unasyn  Nutrition -Continues to be encephalopathic with poor oral intake -Was seen by nutrition/speech therapy and placed on dysphagia diet however patient did have a seizure -NG tube was removed -Likely not a good candidate for PEG tube given her carcinomatosis -transitioned to dysphagia II diet   Hypokalemia/hypomagnesemia- now hyperkalemic -Potassium 5.5 today, will repeat BMP if still hyperkalemic will treat appropriately -Discontinue supplementation  Stage II pressure ulcer of the buttocks -Continue wound care  Goals of care -Palliative care was consulted and appreciated however family wishes to continue aggressive care -Feel patient has an overall poor prognosis given her poor oral intake, seizure activity, multiple comorbidities, stage IV appendiceal cancer -Trying to coordinate family meeting- unfortunately, family has not returned phone call to palliative care -patient continues to decline despite antibiotics and intervention  DVT Prophylaxis  SCDs  Code Status: Full  Family Communication: None at bedside  Disposition Plan: Admitted. Will work up worsening leukocytosis   Consultants PCCM General surgery Neurology  Oncology Palliative care  Procedures  Continuous EEG  Antibiotics   Anti-infectives    Start     Dose/Rate Route Frequency Ordered Stop   07/22/17 0500  vancomycin (VANCOCIN) IVPB 1000 mg/200 mL premix  Status:   Discontinued     1,000 mg 200 mL/hr over 60 Minutes Intravenous Every 12 hours 07/21/17 1545 07/24/17 1047   07/21/17 2200  piperacillin-tazobactam (ZOSYN) IVPB 3.375 g     3.375 g 12.5 mL/hr over 240 Minutes Intravenous Every 8 hours 07/21/17 1545  07/21/17 1630  vancomycin (VANCOCIN) 1,500 mg in sodium chloride 0.9 % 500 mL IVPB     1,500 mg 250 mL/hr over 120 Minutes Intravenous  Once 07/21/17 1545 07/22/17 0000   07/21/17 1500  piperacillin-tazobactam (ZOSYN) IVPB 3.375 g     3.375 g 100 mL/hr over 30 Minutes Intravenous  Once 07/21/17 1449 07/21/17 1638   07/04/17 1100  Ampicillin-Sulbactam (UNASYN) 3 g in sodium chloride 0.9 % 100 mL IVPB     3 g 200 mL/hr over 30 Minutes Intravenous Every 6 hours 07/04/17 0823 07/10/17 0429   07/03/17 1600  Ampicillin-Sulbactam (UNASYN) 3 g in sodium chloride 0.9 % 100 mL IVPB  Status:  Discontinued     3 g 200 mL/hr over 30 Minutes Intravenous Every 6 hours 07/03/17 1047 07/04/17 0823   06/29/17 1400  ceFEPIme (MAXIPIME) 2 g in dextrose 5 % 50 mL IVPB  Status:  Discontinued     2 g 100 mL/hr over 30 Minutes Intravenous Every 8 hours 06/29/17 0853 06/29/17 0857   06/29/17 1000  ceFEPIme (MAXIPIME) 2 g in dextrose 5 % 50 mL IVPB  Status:  Discontinued     2 g 100 mL/hr over 30 Minutes Intravenous Every 8 hours 06/29/17 0857 07/03/17 1046   06/29/17 0930  metroNIDAZOLE (FLAGYL) IVPB 500 mg  Status:  Discontinued     500 mg 100 mL/hr over 60 Minutes Intravenous Every 8 hours 06/29/17 0838 06/30/17 0956   06/24/17 0600  piperacillin-tazobactam (ZOSYN) IVPB 3.375 g  Status:  Discontinued     3.375 g 12.5 mL/hr over 240 Minutes Intravenous Every 8 hours 06/23/17 2254 06/29/17 0838   06/23/17 2300  piperacillin-tazobactam (ZOSYN) IVPB 3.375 g     3.375 g 100 mL/hr over 30 Minutes Intravenous  Once 06/23/17 2254 06/24/17 0056      Subjective:   Lori [name] seen and examined today. No complaints today. Denies chest pain, shortness of  breath, abdominal pain, nausea or vomiting. Was able to eat some yesterday.   Objective:   Vitals:   07/29/17 1300 07/29/17 2011 07/30/17 0500 07/30/17 0537  BP: 140/82 133/77  (!) 151/90  Pulse: (!) 103 (!) 102  (!) 106  Resp: 17 17  17   Temp: 98.1 F (36.7 C) 99.2 F (37.3 C)  98.2 F (36.8 C)  TempSrc: Oral Oral  Oral  SpO2: 97% 98%  99%  Weight:   98.9 kg (218 lb)   Height:        Intake/Output Summary (Last 24 hours) at 07/30/17 1055 Last data filed at 07/30/17 1009  Gross per 24 hour  Intake              750 ml  Output             1300 ml  Net             -550 ml   Filed Weights   07/27/17 1637 07/29/17 0649 07/30/17 0500  Weight: 100.7 kg (222 lb 1.6 oz) 99.6 kg (219 lb 10.3 oz) 98.9 kg (218 lb)   Exam  General: Well developed, chronically ill appearing, NAD  HEENT: NCAT, mucous membranes moist.   Cardiovascular: S1 S2 auscultated, RRR  Respiratory: Diminished but clear (anteriorly), no wheezing   Abdomen: Soft, nontender, nondistended, + bowel sounds  Extremities: warm dry without cyanosis clubbing. ++LE edema B/L   Neuro: AAOx3,  nonfocal   Psych: approriate  Data Reviewed: I have personally reviewed following labs and imaging studies  CBC:  Recent Labs Lab 07/24/17 0500 07/25/17 1520  07/27/17 0457 07/28/17 0433 07/29/17 0358 07/29/17 1516 07/30/17 0431  WBC 15.1* 11.0*  < > 9.7 13.5* 18.0* 23.6* 22.8*  NEUTROABS 13.3* 9.0*  --   --   --   --  21.3* 20.8*  HGB 7.2* 8.0*  < > 7.5* 7.7* 8.0* 8.5* 7.5*  HCT 23.9* 25.7*  < > 25.1* 25.1* 26.4* 28.2* 25.1*  MCV 88.2 88.3  < > 86.9 86.6 88.3 88.4 89.3  PLT 407* 443*  < > 440* 477* 519* 632* 590*  < > = values in this interval not displayed. Basic Metabolic Panel:  Recent Labs Lab 07/25/17 1520 07/26/17 1341 07/27/17 0457 07/28/17 0433 07/29/17 0358 07/30/17 0431  NA 148* 144 143 139 139 139  K 2.4* 3.2* 3.0* 3.7 4.7 5.5*  CL 120* 116* 113* 112* 113* 113*  CO2 23 23 22  21* 20* 21*    GLUCOSE 110* 148* 130* 111* 99 153*  BUN 9 7 6  5* 6 14  CREATININE 0.52 0.61 0.56 0.69 1.11* 1.43*  CALCIUM 7.4* 7.3* 7.6* 7.4* 7.6* 7.5*  MG 1.4*  --  1.6* 1.6* 2.4 2.3   GFR: Estimated Creatinine Clearance: 47.6 mL/min (A) (by C-G formula based on SCr of 1.43 mg/dL (H)). Liver Function Tests:  Recent Labs Lab 07/24/17 0500 07/25/17 1520  AST 16 16  ALT 11* 11*  ALKPHOS 118 98  BILITOT 0.8 0.4  PROT 5.0* 4.9*  ALBUMIN 1.1* 1.2*   No results for input(s): LIPASE, AMYLASE in the last 168 hours. No results for input(s): AMMONIA in the last 168 hours. Coagulation Profile: No results for input(s): INR, PROTIME in the last 168 hours. Cardiac Enzymes: No results for input(s): CKTOTAL, CKMB, CKMBINDEX, TROPONINI in the last 168 hours. BNP (last 3 results) No results for input(s): PROBNP in the last 8760 hours. HbA1C: No results for input(s): HGBA1C in the last 72 hours. CBG:  Recent Labs Lab 07/29/17 0752 07/29/17 1209 07/29/17 1658 07/29/17 2010 07/30/17 0801  GLUCAP 91 107* 120* 142* 134*   Lipid Profile: No results for input(s): CHOL, HDL, LDLCALC, TRIG, CHOLHDL, LDLDIRECT in the last 72 hours. Thyroid Function Tests: No results for input(s): TSH, T4TOTAL, FREET4, T3FREE, THYROIDAB in the last 72 hours. Anemia Panel: No results for input(s): VITAMINB12, FOLATE, FERRITIN, TIBC, IRON, RETICCTPCT in the last 72 hours. Urine analysis:    Component Value Date/Time   COLORURINE YELLOW 07/29/2017 1456   APPEARANCEUR CLOUDY (A) 07/29/2017 1456   LABSPEC 1.010 07/29/2017 1456   LABSPEC 1.010 08/17/2015 1542   PHURINE 5.5 07/29/2017 1456   GLUCOSEU NEGATIVE 07/29/2017 1456   GLUCOSEU 100 08/17/2015 1542   HGBUR TRACE (A) 07/29/2017 1456   BILIRUBINUR NEGATIVE 07/29/2017 1456   BILIRUBINUR Negative 08/17/2015 1542   KETONESUR NEGATIVE 07/29/2017 1456   PROTEINUR NEGATIVE 07/29/2017 1456   UROBILINOGEN 1.0 08/26/2015 0725   UROBILINOGEN 0.2 08/17/2015 1542   NITRITE  POSITIVE (A) 07/29/2017 1456   LEUKOCYTESUR MODERATE (A) 07/29/2017 1456   LEUKOCYTESUR Trace 08/17/2015 1542   Sepsis Labs: @LABRCNTIP (procalcitonin:4,lacticidven:4)  ) Recent Results (from the past 240 hour(s))  C difficile quick scan w PCR reflex     Status: None   Collection Time: 07/20/17 12:40 PM  Result Value Ref Range Status   C Diff antigen NEGATIVE NEGATIVE Final   C Diff toxin NEGATIVE NEGATIVE Final   C Diff interpretation No C. difficile detected.  Final  Gastrointestinal Panel by PCR , Stool  Status: None   Collection Time: 07/20/17 12:40 PM  Result Value Ref Range Status   Campylobacter species NOT DETECTED NOT DETECTED Final   Plesimonas shigelloides NOT DETECTED NOT DETECTED Final   Salmonella species NOT DETECTED NOT DETECTED Final   Yersinia enterocolitica NOT DETECTED NOT DETECTED Final   Vibrio species NOT DETECTED NOT DETECTED Final   Vibrio cholerae NOT DETECTED NOT DETECTED Final   Enteroaggregative E coli (EAEC) NOT DETECTED NOT DETECTED Final   Enteropathogenic E coli (EPEC) NOT DETECTED NOT DETECTED Final   Enterotoxigenic E coli (ETEC) NOT DETECTED NOT DETECTED Final   Shiga like toxin producing E coli (STEC) NOT DETECTED NOT DETECTED Final   Shigella/Enteroinvasive E coli (EIEC) NOT DETECTED NOT DETECTED Final   Cryptosporidium NOT DETECTED NOT DETECTED Final   Cyclospora cayetanensis NOT DETECTED NOT DETECTED Final   Entamoeba histolytica NOT DETECTED NOT DETECTED Final   Giardia lamblia NOT DETECTED NOT DETECTED Final   Adenovirus F40/41 NOT DETECTED NOT DETECTED Final   Astrovirus NOT DETECTED NOT DETECTED Final   Norovirus GI/GII NOT DETECTED NOT DETECTED Final   Rotavirus A NOT DETECTED NOT DETECTED Final   Sapovirus (I, II, IV, and V) NOT DETECTED NOT DETECTED Final  Culture, Urine     Status: Abnormal   Collection Time: 07/21/17  3:51 PM  Result Value Ref Range Status   Specimen Description URINE, CATHETERIZED  Final   Special Requests  NONE  Final   Culture MULTIPLE SPECIES PRESENT, SUGGEST RECOLLECTION (A)  Final   Report Status 07/23/2017 FINAL  Final  Culture, blood (x 2)     Status: None   Collection Time: 07/21/17  4:25 PM  Result Value Ref Range Status   Specimen Description BLOOD RIGHT HAND  Final   Special Requests   Final    BOTTLES DRAWN AEROBIC ONLY Blood Culture results may not be optimal due to an inadequate volume of blood received in culture bottles   Culture NO GROWTH 5 DAYS  Final   Report Status 07/26/2017 FINAL  Final  Culture, blood (x 2)     Status: None   Collection Time: 07/21/17  6:00 PM  Result Value Ref Range Status   Specimen Description BLOOD RIGHT ARM  Final   Special Requests   Final    BOTTLES DRAWN AEROBIC AND ANAEROBIC Blood Culture adequate volume   Culture NO GROWTH 5 DAYS  Final   Report Status 07/26/2017 FINAL  Final      Radiology Studies: Dg Chest Port 1 View  Result Date: 07/29/2017 CLINICAL DATA:  Leukocytosis. EXAM: PORTABLE CHEST 1 VIEW COMPARISON:  07/25/2017 and CT 07/21/2017 FINDINGS: Right jugular Port-A-Cath is present. Catheter tip is near the superior cavoatrial junction. Again noted is mild elevation of the right hemidiaphragm. Persistent hazy densities at the right lung base and cannot exclude a small right pleural effusion. Upper lungs are clear. Heart size is slightly enlarged but stable. Negative for a pneumothorax. IMPRESSION: Persistent subtle densities at right lung base. Findings may represent volume loss and small effusion. No significant change from the recent comparison examination. Electronically Signed   By: Markus Daft M.D.   On: 07/29/2017 09:40     Scheduled Meds: . feeding supplement (GLUCERNA SHAKE)  237 mL Oral TID BM  . folic acid  1 mg Per Tube Daily  . insulin aspart  0-15 Units Subcutaneous TID WC  . insulin aspart  0-5 Units Subcutaneous QHS  . magnesium chloride  1 tablet Oral Daily  .  mirtazapine  7.5 mg Oral QHS  . multivitamin with  minerals  1 tablet Oral Daily  . potassium chloride  40 mEq Oral BID   Continuous Infusions: . sodium chloride    . sodium chloride 75 mL/hr at 07/30/17 0812  . lacosamide (VIMPAT) IV Stopped (07/29/17 2348)  . levETIRAcetam Stopped (07/30/17 1024)  . piperacillin-tazobactam (ZOSYN)  IV Stopped (07/30/17 0921)     LOS: 37 days   Time Spent in minutes   40 minutes  Adhrit Krenz D.O. on 07/30/2017 at 10:55 AM  Between 7am to 7pm - Pager - [phone]  After 7pm go to [email or web address] - password [email or web address]  And look for the night coverage person covering for me after hours  [facility] Office  [phone]

## 2017-07-30 NOTE — Progress Notes (Signed)
   07/30/17 1100  Acute Rehab PT Goals  PT Goal Formulation With patient  Time For Goal Achievement 08/06/17  Potential to Achieve Goals Fair  Goals of 07/12/17 unmet consistently.  Goals revised and will give a trial of one more week of PT to see if pt can make any progress toward goals.  Thanks. Sobieski [phone] (pager)

## 2017-07-30 NOTE — Progress Notes (Addendum)
Pharmacy Antibiotic Note  Lori [name] is a 66 y.o. female admitted on 06/23/2017 with intra-abdominal infection and yeast in urine culture.  Pharmacy has been consulted for Unasyn and Fluconazole dosing.  Pt has been on Zosyn with WBC trending up again & possible intra-abd abscess, narrowing abx to Unasyn.  Fluconazole also being added for yeast in UTI, but pt did have bowel perforation earlier so will go with higher dose than for UTI.  Plan: Unasyn 3g IV q8 Fluconazole 200mg  IV q24 Watch renal function F/U repeat blood and urine cultures  Height: 5\' 8"  (172.7 cm) Weight: 218 lb (98.9 kg) IBW/kg (Calculated) : 63.9  Temp (24hrs), Avg:98.5 F (36.9 C), Min:98.1 F (36.7 C), Max:99.2 F (37.3 C)   Recent Labs Lab 07/24/17 0500 07/25/17 1520  07/26/17 1341 07/27/17 0457 07/28/17 0433 07/29/17 0358 07/29/17 1516 07/30/17 0431  WBC 15.1* 11.0*  < >  --  9.7 13.5* 18.0* 23.6* 22.8*  CREATININE 0.63 0.52  --  0.61 0.56 0.69 1.11*  --  1.43*  LATICACIDVEN 0.8 0.6  --   --   --   --   --   --   --   < > = values in this interval not displayed.  Estimated Creatinine Clearance: 47.6 mL/min (A) (by C-G formula based on SCr of 1.43 mg/dL (H)).    Allergies  Allergen Reactions  . Contrast Media [Iodinated Diagnostic Agents] Itching and Swelling  . Dilantin [Phenytoin Sodium Extended] Itching, Swelling and Other (See Comments)    Whole body  . Latex Hives and Itching  . Adhesive [Tape] Rash    Vancomycin 8/18 >>8/21 Zosyn 7/22 >>7/26; 8/18 >> 8/27 Cefepime 7/26 >>7/31 Flagyl 7/26 >> 7/28 Unasyn 7/31>>8/7; 8/27 >> Fluconazole 8/27 >>  7/21 UCx: [employer]) but w/out sxs 722 BCx: ngf 8/8 MRSA PCR: neg 8/17 Cdiff: neg 8/17 GI panel: neg 8/18 BCxr x 2>> neg 8/18 UCx: multiple species 8/26 Urine 8/27 blood x 2  Thank you for allowing pharmacy to be a part of this patient's care.   Lewie Chamber., PharmD Clinical Pharmacist [facility]

## 2017-07-30 NOTE — Progress Notes (Signed)
[facility] Progress Note     Subjective: CC: No complaints Patient denies abdominal pain, nausea, vomiting, SOB, chest pain. Tolerating diet. Having loose BMs via rectal tube. Foley present.  No questions about wound care.   Objective: Vital signs in last 24 hours: Temp:  [98.1 F (36.7 C)-99.2 F (37.3 C)] 98.2 F (36.8 C) (08/27 0537) Pulse Rate:  [102-106] 106 (08/27 0537) Resp:  [17] 17 (08/27 0537) BP: (133-151)/(77-90) 151/90 (08/27 0537) SpO2:  [97 %-99 %] 99 % (08/27 0537) Weight:  [98.9 kg (218 lb)] 98.9 kg (218 lb) (08/27 0500) Last BM Date: 07/30/17  Intake/Output from previous day: 08/26 0701 - 08/27 0700 In: 630 [P.O.:130; I.V.:10; IV Piggyback:490] Out: 1300 [Urine:500; Stool:800] Intake/Output this shift: No intake/output data recorded.  PE: Gen:  Alert, NAD, cooperative Card:  Regular rate and rhythm Pulm:  Normal effort, clear to auscultation bilaterally Abd: Soft, non-tender, non-distended, bowel sounds present; midline wound clean with some slough tissue on the left border of the wound Skin: warm and dry, no rashes  Psych: A&Ox3; flat affect  Lab Results:   Recent Labs  07/29/17 1516 07/30/17 0431  WBC 23.6* 22.8*  HGB 8.5* 7.5*  HCT 28.2* 25.1*  PLT 632* 590*   BMET  Recent Labs  07/29/17 0358 07/30/17 0431  NA 139 139  K 4.7 5.5*  CL 113* 113*  CO2 20* 21*  GLUCOSE 99 153*  BUN 6 14  CREATININE 1.11* 1.43*  CALCIUM 7.6* 7.5*   PT/INR No results for input(s): LABPROT, INR in the last 72 hours. CMP     Component Value Date/Time   NA 139 07/30/2017 0431   K 5.5 (H) 07/30/2017 0431   CL 113 (H) 07/30/2017 0431   CO2 21 (L) 07/30/2017 0431   GLUCOSE 153 (H) 07/30/2017 0431   BUN 14 07/30/2017 0431   CREATININE 1.43 (H) 07/30/2017 0431   CALCIUM 7.5 (L) 07/30/2017 0431   PROT 4.9 (L) 07/25/2017 1520   ALBUMIN 1.2 (L) 07/25/2017 1520   AST 16 07/25/2017 1520   ALT 11 (L) 07/25/2017 1520   ALKPHOS 98 07/25/2017  1520   BILITOT 0.4 07/25/2017 1520   GFRNONAA 37 (L) 07/30/2017 0431   GFRAA 43 (L) 07/30/2017 0431   Lipase     Component Value Date/Time   LIPASE 31 05/14/2017 2106       Studies/Results: Dg Chest Port 1 View  Result Date: 07/29/2017 CLINICAL DATA:  Leukocytosis. EXAM: PORTABLE CHEST 1 VIEW COMPARISON:  07/25/2017 and CT 07/21/2017 FINDINGS: Right jugular Port-A-Cath is present. Catheter tip is near the superior cavoatrial junction. Again noted is mild elevation of the right hemidiaphragm. Persistent hazy densities at the right lung base and cannot exclude a small right pleural effusion. Upper lungs are clear. Heart size is slightly enlarged but stable. Negative for a pneumothorax. IMPRESSION: Persistent subtle densities at right lung base. Findings may represent volume loss and small effusion. No significant change from the recent comparison examination. Electronically Signed   By: Markus Daft M.D.   On: 07/29/2017 09:40    Anti-infectives: Anti-infectives    Start     Dose/Rate Route Frequency Ordered Stop   07/22/17 0500  vancomycin (VANCOCIN) IVPB 1000 mg/200 mL premix  Status:  Discontinued     1,000 mg 200 mL/hr over 60 Minutes Intravenous Every 12 hours 07/21/17 1545 07/24/17 1047   07/21/17 2200  piperacillin-tazobactam (ZOSYN) IVPB 3.375 g     3.375 g 12.5 mL/hr over 240 Minutes Intravenous  Every 8 hours 07/21/17 1545     07/21/17 1630  vancomycin (VANCOCIN) 1,500 mg in sodium chloride 0.9 % 500 mL IVPB     1,500 mg 250 mL/hr over 120 Minutes Intravenous  Once 07/21/17 1545 07/22/17 0000   07/21/17 1500  piperacillin-tazobactam (ZOSYN) IVPB 3.375 g     3.375 g 100 mL/hr over 30 Minutes Intravenous  Once 07/21/17 1449 07/21/17 1638   07/04/17 1100  Ampicillin-Sulbactam (UNASYN) 3 g in sodium chloride 0.9 % 100 mL IVPB     3 g 200 mL/hr over 30 Minutes Intravenous Every 6 hours 07/04/17 0823 07/10/17 0429   07/03/17 1600  Ampicillin-Sulbactam (UNASYN) 3 g in sodium  chloride 0.9 % 100 mL IVPB  Status:  Discontinued     3 g 200 mL/hr over 30 Minutes Intravenous Every 6 hours 07/03/17 1047 07/04/17 0823   06/29/17 1400  ceFEPIme (MAXIPIME) 2 g in dextrose 5 % 50 mL IVPB  Status:  Discontinued     2 g 100 mL/hr over 30 Minutes Intravenous Every 8 hours 06/29/17 0853 06/29/17 0857   06/29/17 1000  ceFEPIme (MAXIPIME) 2 g in dextrose 5 % 50 mL IVPB  Status:  Discontinued     2 g 100 mL/hr over 30 Minutes Intravenous Every 8 hours 06/29/17 0857 07/03/17 1046   06/29/17 0930  metroNIDAZOLE (FLAGYL) IVPB 500 mg  Status:  Discontinued     500 mg 100 mL/hr over 60 Minutes Intravenous Every 8 hours 06/29/17 0838 06/30/17 0956   06/24/17 0600  piperacillin-tazobactam (ZOSYN) IVPB 3.375 g  Status:  Discontinued     3.375 g 12.5 mL/hr over 240 Minutes Intravenous Every 8 hours 06/23/17 2254 06/29/17 0838   06/23/17 2300  piperacillin-tazobactam (ZOSYN) IVPB 3.375 g     3.375 g 100 mL/hr over 30 Minutes Intravenous  Once 06/23/17 2254 06/24/17 0056       Assessment/Plan Primary appendiceal carcinoma with metastatic disease, stage IV  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 [name]: 1. 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  2. Exploratory laparotomy, drainage of abdominal abscess, repair of small bowel perforation, repair of small bowel enterotomy, enterocolostomy (mid ileum to mid transverse colon). - local wound care, can continue some debridement but this is unlikely to ever heal at this point Likely small bowel ischemia, pneumatosis on ct  - Do not recommend surgical intervention. Stage 4 cancer is not amenable to surgical treatment and operative intervention for pneumatosis would unlikely change patient outcome.  - Her exam is benign, continue abx and patient can be discharged on 5 d PO abx - WBC 22.8, afebrile, mildly tachycardic  FEN  - Dysphagia 1 diet VTE - SCDs ID - continue Zosyn and d/c on 5 days PO abx  Ongoing workup for leukocytosis per medicine.  She can be dispositioned from our standpoint at any time now. Wound care will need to be continued at home or at [facility]. Can do once daily saline WTD dressing.   LOS: 37 days    Brigid Re , [facility] 07/30/2017, 8:38 AM Pager: [phone] Consults: [phone] Mon-Fri 7:00 am-4:30 pm Sat-Sun 7:00 am-11:30 am

## 2017-07-30 NOTE — Progress Notes (Signed)
Flexiseal came off, large brown loose stool output noted. Reinserted tube with 82ml of water.

## 2017-07-30 NOTE — Progress Notes (Signed)
Patient ID: Lori [name], female   DOB: September 03, 1951, 66 y.o.   MRN: [id number]  Patient continues to fail to thrive, intermittent confusion and minimal  po intake.  She has metastatic cancer and she is not a surgical or systemic therapy candidate.  Family continues to hold to their wish for any intervention that will prolong life.    I again left message for son Lori [name] but had no call back, and I missed family at the bedside.  Per nursing son was visiting today  But when I went to speak with him at the bedside he had already left for the day.   Spent time with patient at bedside. I shared with her my worry that her body is failing to thrive and time is limited. Discussed human mortality.  Patient today continues to speak to her hope of improvement and her belief that "God can heal at any time".   I discussed with attending, Dr Ree Kida, this difficult situation.   Will continue to attempt contact with family  Time in 1500      Time out 1520        Total time spent on the unit was 20 minutes Greater than 50% of the time was spent in counseling and coordination of care  Wadie Lessen NP  Palliative Medicine Team Team Phone # [phone] Pager [phone]

## 2017-07-30 NOTE — Progress Notes (Signed)
Dr. Ree Kida notified about lab not being able to draw the blood cultures.

## 2017-07-31 LAB — PREPARE RBC (CROSSMATCH)

## 2017-07-31 LAB — BASIC METABOLIC PANEL
ANION GAP: 5 (ref 5–15)
BUN: 23 mg/dL — ABNORMAL HIGH (ref 6–20)
CHLORIDE: 114 mmol/L — AB (ref 101–111)
CO2: 19 mmol/L — AB (ref 22–32)
Calcium: 7.4 mg/dL — ABNORMAL LOW (ref 8.9–10.3)
Creatinine, Ser: 1.18 mg/dL — ABNORMAL HIGH (ref 0.44–1.00)
GFR calc non Af Amer: 47 mL/min — ABNORMAL LOW (ref >60)
GFR, EST AFRICAN AMERICAN: 54 mL/min — AB (ref >60)
Glucose, Bld: 136 mg/dL — ABNORMAL HIGH (ref 65–99)
POTASSIUM: 4.8 mmol/L (ref 3.5–5.1)
SODIUM: 138 mmol/L (ref 135–145)

## 2017-07-31 LAB — CBC
HEMATOCRIT: 20 % — AB (ref 36.0–46.0)
HEMOGLOBIN: 6.1 g/dL — AB (ref 12.0–15.0)
MCH: 26.9 pg (ref 26.0–34.0)
MCHC: 30.5 g/dL (ref 30.0–36.0)
MCV: 88.1 fL (ref 78.0–100.0)
Platelets: 482 10*3/uL — ABNORMAL HIGH (ref 150–400)
RBC: 2.27 MIL/uL — AB (ref 3.87–5.11)
RDW: 17.3 % — ABNORMAL HIGH (ref 11.5–15.5)
WBC: 25.3 10*3/uL — AB (ref 4.0–10.5)

## 2017-07-31 LAB — C DIFFICILE QUICK SCREEN W PCR REFLEX
C DIFFICILE (CDIFF) INTERP: NOT DETECTED
C DIFFICILE (CDIFF) TOXIN: NEGATIVE
C DIFFICLE (CDIFF) ANTIGEN: NEGATIVE

## 2017-07-31 LAB — HEMOGLOBIN AND HEMATOCRIT, BLOOD
HCT: 24.9 % — ABNORMAL LOW (ref 36.0–46.0)
Hemoglobin: 7.9 g/dL — ABNORMAL LOW (ref 12.0–15.0)

## 2017-07-31 LAB — GLUCOSE, CAPILLARY
GLUCOSE-CAPILLARY: 106 mg/dL — AB (ref 65–99)
GLUCOSE-CAPILLARY: 98 mg/dL (ref 65–99)
Glucose-Capillary: 129 mg/dL — ABNORMAL HIGH (ref 65–99)
Glucose-Capillary: 117 mg/dL — ABNORMAL HIGH (ref 65–99)

## 2017-07-31 MED ORDER — SODIUM CHLORIDE 0.9 % IV SOLN
Freq: Once | INTRAVENOUS | Status: AC
  Administered 2017-07-31: 10 mL via INTRAVENOUS

## 2017-07-31 MED ORDER — FUROSEMIDE 10 MG/ML IJ SOLN
20.0000 mg | Freq: Once | INTRAMUSCULAR | Status: AC
  Administered 2017-07-31: 20 mg via INTRAVENOUS
  Filled 2017-07-31: qty 2

## 2017-07-31 MED ORDER — SODIUM CHLORIDE 0.9 % IV SOLN: Freq: Once | INTRAVENOUS | Status: DC

## 2017-07-31 NOTE — Progress Notes (Addendum)
CRITICAL VALUE ALERT  Critical Value:  Hgb=6.1  Date & Time Notied:  07/31/17; 6004  Provider Notified: [facility] floor coverage, K. Schorr  Orders Received/Actions taken: Awaiting orders.   Received order for 1 unit PRBC transfusion, type and screen since last type and screen was on 8/20.  Paged IV team for blood draw thru [place] order.

## 2017-07-31 NOTE — Progress Notes (Signed)
PROGRESS NOTE    Lori [name]  XQJ:[id number] DOB: February 15, 1951 DOA: 06/23/2017 PCP: [name], MD   Chief Complaint  Patient presents with  . 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 She was at a skilled nursing facility. She was recently hospitalized for small bowel obstruction and underwent diagnostic laparoscopy in June. She underwent repair of small bowel perforation. She had a prolonged hospital stay. Subsequently was discharged to skilled nursing facility. Presented back with rectal bleeding. Seen by oncology. Not thought to be a candidate for any treatment. Then she had what appears to be a seizure resulting in encephalopathy. Neurology was consulted. Palliative medicine continues to follow. Family still desires aggressive care. Patient was transferred to stepdown unit. Critical care medicine was consulted. Patient noted to have profuse urine output. 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 Patient improving. Had MBS, started on dysphagia 2 diet however still poor oral intake, therefore NGT still in place. Patient transitioned to oral Keppra, Tegretol. Mental status improving. Neurology felt no need for lumbar puncture as mental status has been improving.  Patient's WBC/renal function now worsening again. Was placed on IVF, creatinine improving, however WBC continuing to rise. Checking Cdiff again (neg 8/17). Hemoglobin dropped, transfusing.   Assessment & Plan   Severe sepsis secondary to pneumatosis and bowel ischemia -Placed on vancomycin and Zosyn initially however vancomycin was discontinued as there is no evidence of MRSA -Patient still not candidate for surgery per general surgery -Blood cultures show no growth to date  continue IV fluids -Surgery recommended medical management and continuing oral antibiotics for additional 5 days (when able to tolerate diet) -NG tube removed -leukocytosis had improved, but now worsening. Feel this is likely reactive. Currently on zosyn.  -given worsening in creatinine will transition to unasyn   Worsening leukocytosis -Thought to be secondary to the above however patient was on Zosyn -Obtained repeat chest x-ray on 8/26: Unchanged from prior chest x-rays -UA 07/29/2017 shows a cloudy appearance with moderate leukocytes positive nitrites, many bacteria and TNTC WBC, 6-30 sq epi cells, budding yeast (different from prior UAs this admission, patient was treated for Enterococcus UTI with unasyn) -Urine culture showed multiple species -Repeat Blood cultures pending -currently afebrile -Transitioned to unasyn and started on fluconazole (yeast found on UA) -given that WBC today now 25.3, will recheck for Cdiff as patient has been on several broad  -If no improvement, will recheck for Cdiff as patient has been on several broad spectrum antibiotics during her admission (negative on 8/17)  Acute kidney injury -Creatinine bumped to 1.68 -Patient was started on IVF, Zosyn was discontinued -Creatinine improved today 1.18 -Have had IV fluids given the patient will be transfused 2uPRBCs -Continue to monitor BMP  Breakthrough seizure/history of seizure disorder -Stable -Neurology consulted and appreciated -Patient placed on IV Keppra and Vimpat -Neurology recommended continuing keppra 1g BID, Vimpat 100mg  BID  -Discussed with Dr. Rory Percy, will discontinue tegretol (initally held as patient was NPO after seizure activity, and it was initially thought that she may not have been compliant) -CT head on 07/21/17: no definite CT evidence for acute intracranial abnormality  -Tegretol discontinued -Continue seizure precautions  Acute metabolic encephalopathy/Left Thalamic Stroke/ Bilateral  small SDH -Patient was noted to have garbled speech on the afternoon of 06/28/2017 -Neurology consultation appreciated -CT head on  8/1: Evolving encephalomalacia at suspected left thalamic lacunar infarct. Previously identified tiny bilateral subdural hematoma -MRI brain on 7/29: New tiny bilateral subdural hematomas, trace subarachnoid hemorrhage, punctate focus of diffusion abnormality in the dorsal thalamus -It was thought the patient was having seizure activity and she was loaded with Keppra -Patient has had continuous EEG monitoring: Progressively improving diffuse encephalopathy  -Neurology feels lumbar puncture is no longer necessary given that her mental status is improving -Patient had modified barium swallow, was placed on dysphagia 2 diet, however was made NPO due to seizure  -Continues to have improved mental status, currently AAOx2 -transitioned to dysphagia 2 diet- tolerated well.   Diabetes insipidus/polyuria/Hypernatremia -Patient had excessive urine output thought to be due to central diabetes insipidus due to abnormalities noted on MRI of the brain -Resolved however patient was then placed on IV fluids after seizure episode. -Sodium down to 138 -discontinued D5 -Continue to monitor urine output, over the past 24 hours 775cc  Primary appendiceal carcinoma with metastatic disease, stage IV with abdominal wound -Oncology consultation appreciated, patient is not a candidate for chemotherapy and is thought to have an incurable condition. Poor prognosis -Palliative care was consulted. 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 surgery consulted and following- recommended TID wet to dry dressing changes -CT on 8/18: significant pneumatosis most consistent with bowel ischemica. Increase in nodular densities in both lungs suspicious for progressive metastatic disease -Of note, patient  completed course of IV Unasyn which was used to treat urinary tract infection and covered her abdominal abscess -Discussed with surgery, ?repeat abdominal imaging maybe- however not sure if there would be any different intervention  Rectal bleeding of unknown cause -Appears to be stable, patient is not a candidate for endoscopic evaluation due to recent surgery. This was discussed by previous hospitalist with general surgery.  Anemia secondary to acute blood loss -Hemoglobin currently 6.1, appears stable -will transfuse 2uPRBC -Continue to monitor CBC  Diabetes mellitus, type II -Continue insulin sliding scale CBG monitoring  Essential hypertension -Continue to remain stable -Continue clonidine, and PRN hydralazine  Enterococcus UTI -resolved, treated with unasyn  Nutrition -Continues to be encephalopathic with poor oral intake -Was seen by nutrition/speech therapy and placed on dysphagia diet however patient did have a seizure -NG tube was removed -Likely not a good candidate for PEG tube given her carcinomatosis -transitioned to dysphagia II diet   Hypokalemia/hypomagnesemia -resolved, continue to monitor BMP and replace accoridingly  Stage II pressure ulcer of the buttocks -Continue wound care  Goals of care -Palliative care was consulted and appreciated however family wishes to continue aggressive care -Feel patient has an overall poor prognosis given her poor oral intake, seizure activity, multiple comorbidities, stage IV appendiceal cancer -Trying to coordinate family meeting- unfortunately, family has not returned phone call to palliative care -patient continues to decline despite antibiotics and intervention  DVT Prophylaxis  SCDs  Code Status: Full  Family Communication: None at bedside  Disposition Plan: Admitted. Will continue to work up worsening leukocytosis. Dispo TBD.   Consultants PCCM General surgery Neurology  Oncology Palliative  care  Procedures  Continuous EEG  Antibiotics   Anti-infectives    Start     Dose/Rate Route Frequency Ordered Stop   07/30/17 1300  Ampicillin-Sulbactam (UNASYN) 3 g in sodium chloride 0.9 % 100 mL IVPB     3 g 200 mL/hr over 30 Minutes Intravenous Every 8 hours 07/30/17 1208  07/30/17 1300  fluconazole (DIFLUCAN) IVPB 200 mg     200 mg 100 mL/hr over 60 Minutes Intravenous Every 24 hours 07/30/17 1208     07/22/17 0500  vancomycin (VANCOCIN) IVPB 1000 mg/200 mL premix  Status:  Discontinued     1,000 mg 200 mL/hr over 60 Minutes Intravenous Every 12 hours 07/21/17 1545 07/24/17 1047   07/21/17 2200  piperacillin-tazobactam (ZOSYN) IVPB 3.375 g  Status:  Discontinued     3.375 g 12.5 mL/hr over 240 Minutes Intravenous Every 8 hours 07/21/17 1545 07/30/17 1123   07/21/17 1630  vancomycin (VANCOCIN) 1,500 mg in sodium chloride 0.9 % 500 mL IVPB     1,500 mg 250 mL/hr over 120 Minutes Intravenous  Once 07/21/17 1545 07/22/17 0000   07/21/17 1500  piperacillin-tazobactam (ZOSYN) IVPB 3.375 g     3.375 g 100 mL/hr over 30 Minutes Intravenous  Once 07/21/17 1449 07/21/17 1638   07/04/17 1100  Ampicillin-Sulbactam (UNASYN) 3 g in sodium chloride 0.9 % 100 mL IVPB     3 g 200 mL/hr over 30 Minutes Intravenous Every 6 hours 07/04/17 0823 07/10/17 0429   07/03/17 1600  Ampicillin-Sulbactam (UNASYN) 3 g in sodium chloride 0.9 % 100 mL IVPB  Status:  Discontinued     3 g 200 mL/hr over 30 Minutes Intravenous Every 6 hours 07/03/17 1047 07/04/17 0823   06/29/17 1400  ceFEPIme (MAXIPIME) 2 g in dextrose 5 % 50 mL IVPB  Status:  Discontinued     2 g 100 mL/hr over 30 Minutes Intravenous Every 8 hours 06/29/17 0853 06/29/17 0857   06/29/17 1000  ceFEPIme (MAXIPIME) 2 g in dextrose 5 % 50 mL IVPB  Status:  Discontinued     2 g 100 mL/hr over 30 Minutes Intravenous Every 8 hours 06/29/17 0857 07/03/17 1046   06/29/17 0930  metroNIDAZOLE (FLAGYL) IVPB 500 mg  Status:  Discontinued     500  mg 100 mL/hr over 60 Minutes Intravenous Every 8 hours 06/29/17 0838 06/30/17 0956   06/24/17 0600  piperacillin-tazobactam (ZOSYN) IVPB 3.375 g  Status:  Discontinued     3.375 g 12.5 mL/hr over 240 Minutes Intravenous Every 8 hours 06/23/17 2254 06/29/17 0838   06/23/17 2300  piperacillin-tazobactam (ZOSYN) IVPB 3.375 g     3.375 g 100 mL/hr over 30 Minutes Intravenous  Once 06/23/17 2254 06/24/17 0056      Subjective:   Lori [name] seen and examined today. Complains of right leg pain. Denies any chest pain, short of breath, abdominal pain, nausea vomiting, dizziness or headache. She was able to eat some yesterday.  Objective:   Vitals:   07/31/17 0700 07/31/17 1000 07/31/17 1023 07/31/17 1038  BP: 131/81 (!) 167/89 (!) 167/89 (!) 142/80  Pulse: (!) 114 (!) 115 (!) 115 (!) 114  Resp: 18 18 18 18   Temp: 98.8 F (37.1 C) 97.7 F (36.5 C) 97.7 F (36.5 C) (!) 97.5 F (36.4 C)  TempSrc: Oral Oral Oral Oral  SpO2:  98% 98% 100%  Weight:      Height:        Intake/Output Summary (Last 24 hours) at 07/31/17 1118 Last data filed at 07/31/17 1038  Gross per 24 hour  Intake             3007 ml  Output              975 ml  Net             2032  ml   Filed Weights   07/27/17 1637 07/29/17 0649 07/30/17 0500  Weight: 100.7 kg (222 lb 1.6 oz) 99.6 kg (219 lb 10.3 oz) 98.9 kg (218 lb)   Exam  General: Well developed, Chronically ill-appearing, no apparent distress  HEENT: NCAT, mucous membranes moist.   Cardiovascular: S1 S2 auscultated, tachycardic, no murmurs  Respiratory: Diminished breath sounds anteriorly  Abdomen: Soft, nontender, nondistended, + bowel sounds, dressing in place  Extremities: warm dry without cyanosis clubbing. 2+ LE edema.   Neuro: AAOx3, nonfocal  Data Reviewed: I have personally reviewed following labs and imaging studies  CBC:  Recent Labs Lab 07/25/17 1520  07/28/17 0433 07/29/17 0358 07/29/17 1516 07/30/17 0431 07/31/17 0347   WBC 11.0*  < > 13.5* 18.0* 23.6* 22.8* 25.3*  NEUTROABS 9.0*  --   --   --  21.3* 20.8*  --   HGB 8.0*  < > 7.7* 8.0* 8.5* 7.5* 6.1*  HCT 25.7*  < > 25.1* 26.4* 28.2* 25.1* 20.0*  MCV 88.3  < > 86.6 88.3 88.4 89.3 88.1  PLT 443*  < > 477* 519* 632* 590* 482*  < > = values in this interval not displayed. Basic Metabolic Panel:  Recent Labs Lab 07/25/17 1520  07/27/17 0457 07/28/17 0433 07/29/17 0358 07/30/17 0431 07/30/17 2056 07/31/17 0347  NA 148*  < > 143 139 139 139 138 138  K 2.4*  < > 3.0* 3.7 4.7 5.5* 4.8 4.8  CL 120*  < > 113* 112* 113* 113* 113* 114*  CO2 23  < > 22 21* 20* 21* 19* 19*  GLUCOSE 110*  < > 130* 111* 99 153* 117* 136*  BUN 9  < > 6 5* 6 14 20  23*  CREATININE 0.52  < > 0.56 0.69 1.11* 1.43* 1.68* 1.18*  CALCIUM 7.4*  < > 7.6* 7.4* 7.6* 7.5* 7.4* 7.4*  MG 1.4*  --  1.6* 1.6* 2.4 2.3  --   --   < > = values in this interval not displayed. GFR: Estimated Creatinine Clearance: 57.7 mL/min (A) (by C-G formula based on SCr of 1.18 mg/dL (H)). Liver Function Tests:  Recent Labs Lab 07/25/17 1520  AST 16  ALT 11*  ALKPHOS 98  BILITOT 0.4  PROT 4.9*  ALBUMIN 1.2*   No results for input(s): LIPASE, AMYLASE in the last 168 hours. No results for input(s): AMMONIA in the last 168 hours. Coagulation Profile: No results for input(s): INR, PROTIME in the last 168 hours. Cardiac Enzymes: No results for input(s): CKTOTAL, CKMB, CKMBINDEX, TROPONINI in the last 168 hours. BNP (last 3 results) No results for input(s): PROBNP in the last 8760 hours. HbA1C: No results for input(s): HGBA1C in the last 72 hours. CBG:  Recent Labs Lab 07/30/17 0801 07/30/17 1206 07/30/17 1715 07/30/17 2132 07/31/17 0752  GLUCAP 134* 121* 109* 99 106*   Lipid Profile: No results for input(s): CHOL, HDL, LDLCALC, TRIG, CHOLHDL, LDLDIRECT in the last 72 hours. Thyroid Function Tests: No results for input(s): TSH, T4TOTAL, FREET4, T3FREE, THYROIDAB in the last 72  hours. Anemia Panel: No results for input(s): VITAMINB12, FOLATE, FERRITIN, TIBC, IRON, RETICCTPCT in the last 72 hours. Urine analysis:    Component Value Date/Time   COLORURINE YELLOW 07/29/2017 1456   APPEARANCEUR CLOUDY (A) 07/29/2017 1456   LABSPEC 1.010 07/29/2017 1456   LABSPEC 1.010 08/17/2015 1542   PHURINE 5.5 07/29/2017 1456   GLUCOSEU NEGATIVE 07/29/2017 1456   GLUCOSEU 100 08/17/2015 1542   HGBUR TRACE (A)  07/29/2017 [place] 07/29/2017 1456   BILIRUBINUR Negative 08/17/2015 1542   [place] 07/29/2017 1456   PROTEINUR NEGATIVE 07/29/2017 1456   UROBILINOGEN 1.0 08/26/2015 0725   UROBILINOGEN 0.2 08/17/2015 1542   NITRITE POSITIVE (A) 07/29/2017 1456   LEUKOCYTESUR MODERATE (A) 07/29/2017 1456   LEUKOCYTESUR Trace 08/17/2015 1542   Sepsis Labs: @LABRCNTIP (procalcitonin:4,lacticidven:4)  ) Recent Results (from the past 240 hour(s))  Culture, Urine     Status: Abnormal   Collection Time: 07/21/17  3:51 PM  Result Value Ref Range Status   Specimen Description URINE, CATHETERIZED  Final   Special Requests NONE  Final   Culture MULTIPLE SPECIES PRESENT, SUGGEST RECOLLECTION (A)  Final   Report Status 07/23/2017 FINAL  Final  Culture, blood (x 2)     Status: None   Collection Time: 07/21/17  4:25 PM  Result Value Ref Range Status   Specimen Description BLOOD RIGHT HAND  Final   Special Requests   Final    BOTTLES DRAWN AEROBIC ONLY Blood Culture results may not be optimal due to an inadequate volume of blood received in culture bottles   Culture NO GROWTH 5 DAYS  Final   Report Status 07/26/2017 FINAL  Final  Culture, blood (x 2)     Status: None   Collection Time: 07/21/17  6:00 PM  Result Value Ref Range Status   Specimen Description BLOOD RIGHT ARM  Final   Special Requests   Final    BOTTLES DRAWN AEROBIC AND ANAEROBIC Blood Culture adequate volume   Culture NO GROWTH 5 DAYS  Final   Report Status 07/26/2017 FINAL  Final   Urine Culture     Status: Abnormal   Collection Time: 07/29/17  2:56 PM  Result Value Ref Range Status   Specimen Description URINE, RANDOM  Final   Special Requests NONE  Final   Culture MULTIPLE SPECIES PRESENT, SUGGEST RECOLLECTION (A)  Final   Report Status 07/30/2017 FINAL  Final      Radiology Studies: No results found.   Scheduled Meds: . feeding supplement (GLUCERNA SHAKE)  237 mL Oral TID BM  . folic acid  1 mg Per Tube Daily  . furosemide  20 mg Intravenous Once  . insulin aspart  0-15 Units Subcutaneous TID WC  . insulin aspart  0-5 Units Subcutaneous QHS  . magnesium chloride  1 tablet Oral Daily  . mirtazapine  7.5 mg Oral QHS  . multivitamin with minerals  1 tablet Oral Daily   Continuous Infusions: . sodium chloride    . sodium chloride 75 mL/hr at 07/30/17 0812  . sodium chloride    . sodium chloride    . ampicillin-sulbactam (UNASYN) IV Stopped (07/31/17 0520)  . fluconazole (DIFLUCAN) IV Stopped (07/30/17 1637)  . lacosamide (VIMPAT) IV Stopped (07/30/17 2241)  . levETIRAcetam 1,000 mg (07/31/17 0755)     LOS: 38 days   Time Spent in minutes   35 minutes  Shawnia Vizcarrondo D.O. on 07/31/2017 at 11:18 AM  Between 7am to 7pm - Pager - [phone]  After 7pm go to [email or web address] - password [email or web address]  And look for the night coverage person covering for me after hours  [facility] Office  [phone]

## 2017-08-01 LAB — GLUCOSE, CAPILLARY
GLUCOSE-CAPILLARY: 91 mg/dL (ref 65–99)
GLUCOSE-CAPILLARY: 111 mg/dL — AB (ref 65–99)
GLUCOSE-CAPILLARY: 156 mg/dL — AB (ref 65–99)
Glucose-Capillary: 138 mg/dL — ABNORMAL HIGH (ref 65–99)

## 2017-08-01 LAB — PREPARE RBC (CROSSMATCH)

## 2017-08-01 LAB — HEMOGLOBIN AND HEMATOCRIT, BLOOD
HCT: 13.3 % — ABNORMAL LOW (ref 36.0–46.0)
HCT: 16.4 % — ABNORMAL LOW (ref 36.0–46.0)
Hemoglobin: 4.3 g/dL — CL (ref 12.0–15.0)
Hemoglobin: 5.3 g/dL — CL (ref 12.0–15.0)

## 2017-08-01 MED ORDER — BOOST / RESOURCE BREEZE PO LIQD
1.0000 | Freq: Three times a day (TID) | ORAL | Status: DC
  Administered 2017-08-01 – 2017-08-08 (×17): 1 via ORAL

## 2017-08-01 MED ORDER — SODIUM CHLORIDE 0.9 % IV SOLN
Freq: Once | INTRAVENOUS | Status: AC
  Administered 2017-08-01: 17:00:00 via INTRAVENOUS

## 2017-08-01 NOTE — Progress Notes (Signed)
Patient noted with large amount of rectal bleeding mixed with blood clots, MD made aware and Palliative NP is aware.

## 2017-08-01 NOTE — Progress Notes (Signed)
CNA and RN did incontinent care and bed bath of patient due to leaking rectal pouch. Noted significant amount of dark red mucuosy/liquid from rectum and rectal pouch.  [facility] floor coverage PA will be made aware of this finding.

## 2017-08-01 NOTE — Progress Notes (Signed)
PROGRESS NOTE    Lori [name]  ZDG:[id number] DOB: 1951-01-17 DOA: 06/23/2017 PCP: [name], MD    Brief Narrative:  66 year old female presented with hematochezia. Patient is known to have type 2 diabetes mellitus, seizures, hypertension, GERD, cervical and appendiceal carcinoma, intracranial aneurysm status post repair 1997. Recent laparotomy June 26, for small bowel obstruction, then she was diagnosed with appendiceal adenocarcinoma, discharge to skilled nursing facility, wound VAC was placed. On initial physical examination she was noted to have frank hematochezia with large blood clots per rectum. Blood pressure 166/101, heart rate 94, respiratory rate 28, oxygen saturation 98%. Moist mucous membranes, lungs had decreased breath sounds at bases, positive rales, heart S1-S2 present rhythmic, the abdomen had tenderness to palpation, no rebound, wound VAC was in place, positive significant lower extremity edema +++ pitting up to the sacrum.   Patient was admitted to the hospital working diagnosis of lower GI bleed, complicated with intra-abdominal fluid collections, to rule out abscesses.   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 Acute recurrent blood loss anemia due to lower GI bleed. Patient noted to have blood per rectum, check hb and hct down to 4,4 and 13,3. Will order 2 units pbrc and will transfer to step down unit. Patient is very poor prognosis due to advanced malignancy. Possible  bleeding from malignancy, no further intervention indicated per surgery team, will continue supportive medical therapy with blood transfusions, target hb above 7. H&H q 6 hours.   2. Resolving sepsis due to bowel ischemia with pneumatosis. Patient sp extensive laparotomy, will continue conservative medical care, diet has been advanced with good toleration. Will continue on Unsyn #3, for now, very poor prognosis.  .   3. Leukocytosis. Likely reactive or malignancy related, will continue close follow up of cell count, will continue antibiotic therapy for now. Noted elevated platelets as well.   4. AKI. Renal function with serum cr down to 1.1 from 1,6, will continue close monitoring of cr and electrolytes, will avoid hypotension and nephrotoxic medications.   5. Seizures. Patient awake and alert able to answer simple questions, will continue anti seizure regimen with Vimapt and keppra, as needed lorazepam.   6. Metabolic encephalopathy. Clinically improved, patient more awake and reactive, not agitated.   7. 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 Serum Na at 138, will continue to follow on renal function in am.  8. 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 Very poor prognosis, patient's son will to continue full medical care, has declined palliative care services.     DVT prophylaxis:  Code Status:  Family Communication:  Disposition Plan:    Consultants:   Palliative care  Surgery  Procedures:     Antimicrobials:      Subjective: Patient is more awake and reactive, no chest pain, nausea or vomiting, no abdominal pain. No dyspnea. Positive rectal bleeding, tolerating po well.   Objective: Vitals:   07/31/17 1308 07/31/17 1900 08/01/17 0433 08/01/17 0446  BP: 139/90  114/82 123/68   Pulse: (!) 114 (!) 115 (!) 112   Resp: 20 20 20    Temp: 98.7 F (37.1 C) 98.4 F (36.9 C) 98.7 F (37.1 C)   TempSrc: Oral Oral Oral   SpO2: 100% 99%  98%   Weight:    99.8 kg (220 lb)  Height:        Intake/Output Summary (Last 24 hours) at 08/01/17 1147 Last data filed at 08/01/17 0956  Gross per 24 hour  Intake           2912.5 ml  Output               25 ml  Net           2887.5 ml   Filed Weights   07/29/17 0649 07/30/17 0500 08/01/17 0446  Weight: 99.6 kg (219 lb 10.3 oz) 98.9 kg (218 lb) 99.8 kg (220 lb)    Examination:  General: deconditioned Neurology: Awake and alert, non focal  E ENT: positve pallor, no icterus, oral mucosa moist Cardiovascular: S1-S2 present, rhythmic, no gallops, rubs, or murmurs. No jugular venous distention, no lower extremity edema. Pulmonary: decreased breath sounds bilaterally at bases, adequate air movement, no wheezing, rhonchi or rales. Gastrointestinal. Abdomen protuberant, no organomegaly, non tender, no rebound or guarding Skin. No rashes Musculoskeletal: no joint deformities     Data Reviewed: I have personally reviewed following labs and imaging studies  CBC:  Recent Labs Lab 07/25/17 1520  07/28/17 0433 07/29/17 0358 07/29/17 1516 07/30/17 0431 07/31/17 0347 07/31/17 1400  WBC 11.0*  < > 13.5* 18.0* 23.6* 22.8* 25.3*  --   NEUTROABS 9.0*  --   --   --  21.3* 20.8*  --   --   HGB 8.0*  < > 7.7* 8.0* 8.5* 7.5* 6.1* 7.9*  HCT 25.7*  < > 25.1* 26.4* 28.2* 25.1* 20.0* 24.9*  MCV 88.3  < > 86.6 88.3 88.4 89.3 88.1  --   PLT 443*  < > 477* 519* 632* 590* 482*  --   < > = values in this interval not displayed. Basic Metabolic Panel:  Recent Labs Lab 07/25/17 1520  07/27/17 0457 07/28/17 0433 07/29/17 0358 07/30/17 0431 07/30/17 2056 07/31/17 0347  NA 148*  < > 143 139 139 139 138 138  K 2.4*  < > 3.0* 3.7 4.7 5.5* 4.8 4.8  CL 120*  < > 113* 112* 113* 113* 113* 114*  CO2 23  < > 22 21* 20* 21* 19* 19*  GLUCOSE 110*  < > 130* 111* 99 153* 117* 136*  BUN 9  < > 6 5* 6 14 20  23*  CREATININE 0.52  < > 0.56 0.69 1.11* 1.43* 1.68* 1.18*  CALCIUM 7.4*  < > 7.6* 7.4*  7.6* 7.5* 7.4* 7.4*  MG 1.4*  --  1.6* 1.6* 2.4 2.3  --   --   < > = values in this interval not displayed. GFR: Estimated Creatinine Clearance: 58 mL/min (A) (by C-G formula based on SCr of 1.18 mg/dL (H)). Liver Function Tests:  Recent Labs Lab 07/25/17 1520  AST 16  ALT 11*  ALKPHOS 98  BILITOT 0.4  PROT 4.9*  ALBUMIN 1.2*   No results for input(s): LIPASE, AMYLASE in the last 168 hours. No results for input(s): AMMONIA in the last 168 hours. Coagulation Profile: No results for input(s): INR, PROTIME in the last 168 hours. Cardiac Enzymes: No results for input(s): CKTOTAL, CKMB, CKMBINDEX, TROPONINI in the last  168 hours. BNP (last 3 results) No results for input(s): PROBNP in the last 8760 hours. HbA1C: No results for input(s): HGBA1C in the last 72 hours. CBG:  Recent Labs Lab 07/31/17 0752 07/31/17 1203 07/31/17 1751 07/31/17 2138 08/01/17 0755  GLUCAP 106* 98 129* 117* 91   Lipid Profile: No results for input(s): CHOL, HDL, LDLCALC, TRIG, CHOLHDL, LDLDIRECT in the last 72 hours. Thyroid Function Tests: No results for input(s): TSH, T4TOTAL, FREET4, T3FREE, THYROIDAB in the last 72 hours. Anemia Panel: No results for input(s): VITAMINB12, FOLATE, FERRITIN, TIBC, IRON, RETICCTPCT in the last 72 hours.    Radiology Studies: I have reviewed all of the imaging during this hospital visit personally     Scheduled Meds: . feeding supplement (GLUCERNA SHAKE)  237 mL Oral TID BM  . folic acid  1 mg Per Tube Daily  . insulin aspart  0-15 Units Subcutaneous TID WC  . insulin aspart  0-5 Units Subcutaneous QHS  . magnesium chloride  1 tablet Oral Daily  . mirtazapine  7.5 mg Oral QHS  . multivitamin with minerals  1 tablet Oral Daily   Continuous Infusions: . sodium chloride    . sodium chloride 75 mL/hr at 08/01/17 0634  . sodium chloride    . ampicillin-sulbactam (UNASYN) IV Stopped (08/01/17 0515)  . fluconazole (DIFLUCAN) IV Stopped (07/31/17 1427)  .  lacosamide (VIMPAT) IV Stopped (07/31/17 2243)  . levETIRAcetam Stopped (08/01/17 1117)     LOS: 39 days      Mauricio Gerome Apley, MD [facility] Pager [phone]

## 2017-08-01 NOTE — Progress Notes (Signed)
CRITICAL VALUE ALERT  Critical Value:  H & H of 4.3 & 13.3   Date & Time Notied:  08/01/17 @ 1540  Provider Notified: Dr. Cathlean Sauer, Jerilynn Mages.  Orders Received/Actions taken: awaiting call back

## 2017-08-01 NOTE — Progress Notes (Signed)
Patient just ransferred to 4 East room 1 accompanied by son after the 1st unit of PRBC was completed.  Report given to 4E RN and endorsed for the remaining 1 unit PRBC to be transfused.  CCMD was made aware of the transfer.

## 2017-08-01 NOTE — Final Consult Note (Signed)
[facility] Progress Note     Subjective: CC: rectal bleeding Patient had bleeding with removal of rectal tube this AM.  Tachycardic. Pressure fine. Patient not symptomatic.  Objective: Vital signs in last 24 hours: Temp:  [97.5 F (36.4 C)-98.7 F (37.1 C)] 98.7 F (37.1 C) (08/29 0433) Pulse Rate:  [112-115] 112 (08/29 0433) Resp:  [18-20] 20 (08/29 0433) BP: (114-167)/(68-95) 123/68 (08/29 0433) SpO2:  [98 %-100 %] 98 % (08/29 0433) Weight:  [99.8 kg (220 lb)] 99.8 kg (220 lb) (08/29 0446) Last BM Date: 07/31/17  Intake/Output from previous day: 08/28 0701 - 08/29 0700 In: 3374.5 [P.O.:240; I.V.:1787.5; Blood:767; IV Piggyback:580] Out: 0  Intake/Output this shift: No intake/output data recorded.  PE: Gen:  Alert, NAD, pleasant Pulm:  Normal effort,  Abd: Soft, non-tender, non-distended, no HSM Skin: midline wound clean with slough along right wound border, sutures visible but no dihisence.  Psych: A&Ox3   Lab Results:   Recent Labs  07/30/17 0431 07/31/17 0347 07/31/17 1400  WBC 22.8* 25.3*  --   HGB 7.5* 6.1* 7.9*  HCT 25.1* 20.0* 24.9*  PLT 590* 482*  --    BMET  Recent Labs  07/30/17 2056 07/31/17 0347  NA 138 138  K 4.8 4.8  CL 113* 114*  CO2 19* 19*  GLUCOSE 117* 136*  BUN 20 23*  CREATININE 1.68* 1.18*  CALCIUM 7.4* 7.4*   PT/INR No results for input(s): LABPROT, INR in the last 72 hours. CMP     Component Value Date/Time   NA 138 07/31/2017 0347   K 4.8 07/31/2017 0347   CL 114 (H) 07/31/2017 0347   CO2 19 (L) 07/31/2017 0347   GLUCOSE 136 (H) 07/31/2017 0347   BUN 23 (H) 07/31/2017 0347   CREATININE 1.18 (H) 07/31/2017 0347   CALCIUM 7.4 (L) 07/31/2017 0347   PROT 4.9 (L) 07/25/2017 1520   ALBUMIN 1.2 (L) 07/25/2017 1520   AST 16 07/25/2017 1520   ALT 11 (L) 07/25/2017 1520   ALKPHOS 98 07/25/2017 1520   BILITOT 0.4 07/25/2017 1520   GFRNONAA 47 (L) 07/31/2017 0347   GFRAA 54 (L) 07/31/2017 0347   Lipase    Component Value Date/Time   LIPASE 31 05/14/2017 2106    Anti-infectives: Anti-infectives    Start     Dose/Rate Route Frequency Ordered Stop   07/30/17 1300  Ampicillin-Sulbactam (UNASYN) 3 g in sodium chloride 0.9 % 100 mL IVPB     3 g 200 mL/hr over 30 Minutes Intravenous Every 8 hours 07/30/17 1208     07/30/17 1300  fluconazole (DIFLUCAN) IVPB 200 mg     200 mg 100 mL/hr over 60 Minutes Intravenous Every 24 hours 07/30/17 1208     07/22/17 0500  vancomycin (VANCOCIN) IVPB 1000 mg/200 mL premix  Status:  Discontinued     1,000 mg 200 mL/hr over 60 Minutes Intravenous Every 12 hours 07/21/17 1545 07/24/17 1047   07/21/17 2200  piperacillin-tazobactam (ZOSYN) IVPB 3.375 g  Status:  Discontinued     3.375 g 12.5 mL/hr over 240 Minutes Intravenous Every 8 hours 07/21/17 1545 07/30/17 1123   07/21/17 1630  vancomycin (VANCOCIN) 1,500 mg in sodium chloride 0.9 % 500 mL IVPB     1,500 mg 250 mL/hr over 120 Minutes Intravenous  Once 07/21/17 1545 07/22/17 0000   07/21/17 1500  piperacillin-tazobactam (ZOSYN) IVPB 3.375 g     3.375 g 100 mL/hr over 30 Minutes Intravenous  Once 07/21/17 1449 07/21/17 1638  07/04/17 1100  Ampicillin-Sulbactam (UNASYN) 3 g in sodium chloride 0.9 % 100 mL IVPB     3 g 200 mL/hr over 30 Minutes Intravenous Every 6 hours 07/04/17 0823 07/10/17 0429   07/03/17 1600  Ampicillin-Sulbactam (UNASYN) 3 g in sodium chloride 0.9 % 100 mL IVPB  Status:  Discontinued     3 g 200 mL/hr over 30 Minutes Intravenous Every 6 hours 07/03/17 1047 07/04/17 0823   06/29/17 1400  ceFEPIme (MAXIPIME) 2 g in dextrose 5 % 50 mL IVPB  Status:  Discontinued     2 g 100 mL/hr over 30 Minutes Intravenous Every 8 hours 06/29/17 0853 06/29/17 0857   06/29/17 1000  ceFEPIme (MAXIPIME) 2 g in dextrose 5 % 50 mL IVPB  Status:  Discontinued     2 g 100 mL/hr over 30 Minutes Intravenous Every 8 hours 06/29/17 0857 07/03/17 1046   06/29/17 0930  metroNIDAZOLE (FLAGYL) IVPB 500 mg  Status:   Discontinued     500 mg 100 mL/hr over 60 Minutes Intravenous Every 8 hours 06/29/17 0838 06/30/17 0956   06/24/17 0600  piperacillin-tazobactam (ZOSYN) IVPB 3.375 g  Status:  Discontinued     3.375 g 12.5 mL/hr over 240 Minutes Intravenous Every 8 hours 06/23/17 2254 06/29/17 0838   06/23/17 2300  piperacillin-tazobactam (ZOSYN) IVPB 3.375 g     3.375 g 100 mL/hr over 30 Minutes Intravenous  Once 06/23/17 2254 06/24/17 0056       Assessment/Plan Primary appendiceal carcinoma with metastatic disease, stage IV  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 [name]: 1. 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  2. Exploratory laparotomy, drainage of abdominal abscess, repair of small bowel perforation, repair of small bowel enterotomy, enterocolostomy (mid ileum to mid transverse colon). - local wound care, can continue some debridement but this is unlikely to ever heal at this point Likely small bowel ischemia, pneumatosis on ct  - Do not recommend surgical intervention. Stage 4 cancer is not amenable to surgical treatment and operative intervention for pneumatosis would unlikely change patient outcome.  - Her exam is benign, continue abx and patient can be discharged on 5 d PO abx - afebrile  FEN - Dysphagia 1 diet VTE - SCDs ID - continue Zosyn and d/c on 5 days PO augmentin  Management of rectal bleeding per primary, don't think there's anything to do surgically. Continue current wound care. We will sign off at this time, please call with questions or concerns.   LOS: 39 days    Brigid Re , [facility] 08/01/2017, 9:58 AM Pager: [phone] Consults: [phone] Mon-Fri 7:00 am-4:30 pm Sat-Sun 7:00 am-11:30 am

## 2017-08-01 NOTE — Progress Notes (Signed)
Patient ID: Lori [name], female   DOB: 1951-08-12, 66 y.o.   MRN: [id number]  Patient continues to fail to thrive, today with rectal bleeding and minimal  po intake.  She has metastatic cancer and she is not a surgical or systemic therapy candidate.   I sat with patient today and she tells me she is ready to go "in God's time".  She wishes she could close her eyes in her sleep and pass away like her father did.   She speaks to her sons not being able to "let her go".  However she does not verbalize a clear desire to shift to a full comfort path.  She speaks to her belief in heaven as an afterlife.  Sat quietly with patient at bed side  Multiple telephone messages have been left for son [name] who is the main decision maker over the past week but has not received a call.  Today I left a message and got a call back from [facility] wife Levada Dy, she reinforces that [name] has made a promise to his mother to do everything until her last breath and he will not waver from that decision.  15000   notified by nursing that son was visiting at bedside.    I read visited patient and family at bedside to include son [name] and a grand son. I shared with her my worry that her body is failing to thrive and time is limited.  Verbalized my concern that the patient has an underlying terminal disease and the limitations of medical interventions at this time, for this patient in this situation. Opened up space for patient to verbalize any questions, concerns, or worries.  Her son [name] became very upset, raising his voice and standing up.  He asked that I leave the room and not come back.  He verbalized "you are all giving up on my mother and you want her to die".   I expressed my main concern was for his mother's comfort, quality and dignity.  Family continue to be open to all offered and available medical interventions to prolong life.   I told him I would honor his request and the palliative medicine team would  not continue as part of the treatment plan; unless his mother specifically requested another visit.  Discussed with Dr.Arrien      Will sign off at this time.    No charge  Wadie Lessen NP  Palliative Medicine Team Team Phone # [phone] Pager [phone]

## 2017-08-01 NOTE — Progress Notes (Signed)
Attempted report to 4E for patient transfer but RN receiving still getting report.  4E will call back this RN.  Blood transfusion infusing.

## 2017-08-01 NOTE — Progress Notes (Signed)
Nutrition Follow-up  DOCUMENTATION CODES:   Obesity unspecified  INTERVENTION:   -D/c Glucerna Shake po TID, each supplement provides 220 kcal and 10 grams of protein -Continue MVI daily -Boost Breeze po TID, each supplement provides 250 kcal and 9 grams of protein -Recommend liberalization of diet as much as medically feasible to broaden food selections in attempt to optimize intake  NUTRITION DIAGNOSIS:   Inadequate oral intake related to altered GI function, chronic illness as evidenced by meal completion < 25%.  Ongoing  GOAL:   Patient will meet greater than or equal to 90% of their needs  Progressing  MONITOR:   PO intake, Supplement acceptance, Diet advancement, Labs, Weight trends, Skin, I & O's  REASON FOR ASSESSMENT:   Consult Assessment of nutrition requirement/status  ASSESSMENT:   Lori [name] is a 66 y.o. female with medical history significant of DM2, Seizures (not on medication), HTN, HLD, GERD, cervical and appendiceal carcinoma, intracranial aneurysm repair in 1997 who presents for rectal bleeding.  8/18- d/c to SNF cancelled d/t rapid response, seizure, CT revealed large ischemic bowel; cortrak tube removed, NGT placed for decompression 8/21- NGT removed, pt allowed ice chips 8/23- advanced to clear liquid diet 8/24- advanced to full liquid diet 8/25- advanced to soft diet 8/26- downgraded to dysphagia 1 diet  Spoke with pt, who was more alert than compared with this RD's previous interactions with pt. Pt reports "I've never felt bad".   She reports her appetite is "fine" and complains about pureed food- "I don't like it; it just balls up in my mouth". She also does not like Glucerna or Ensure type supplements as "they are too thick". Pt requesting cereal and milk. Noted meal completion 0-40%.   Pt reports doing well with liquids; noted two cans of sprite at bedside. Pt reports that she drank an orange drink, which she enjoyed.   Paged Dr.  Beaulah Dinning at [phone] in attempt to discuss diet advancement, however, did not receive return call. Pt was on a dysphagia 2 diet with thin liquids prior to rapid response on 07/21/17.  Reviewed palliative care note; service will sign off per son's request, unless pt requests return visit.  Medications reviewed and include folvite, remeron, and MVI.   Labs reviewed: CBGS: 91-117.   Diet Order:  Diet - low sodium heart healthy Diet Carb Modified DIET - DYS 1 Room service appropriate? Yes; Fluid consistency: Thin  Skin:  Wound (see comment) (stage II buttocks x2, closed abdominal incision)  Last BM:  08/01/17  Height:   Ht Readings from Last 1 Encounters:  07/27/17 5\' 8"  (1.727 m)    Weight:   Wt Readings from Last 1 Encounters:  08/01/17 220 lb (99.8 kg)    Ideal Body Weight:  65.9 kg  BMI:  Body mass index is 33.45 kg/m.  Estimated Nutritional Needs:   Kcal:  1900-2100  Protein:  115-130 grams  Fluid:  > 1.9 L  EDUCATION NEEDS:   Education needs addressed  Marshawn Ninneman A. Jimmye Norman, RD, LDN, CDE Pager: [phone] After hours Pager: [phone]

## 2017-08-02 DIAGNOSIS — K625 Hemorrhage of anus and rectum: Secondary | ICD-10-CM

## 2017-08-02 LAB — CBC WITH DIFFERENTIAL/PLATELET
BASOS ABS: 0 10*3/uL (ref 0.0–0.1)
BASOS PCT: 0 %
Eosinophils Absolute: 0 10*3/uL (ref 0.0–0.7)
Eosinophils Relative: 0 %
HEMATOCRIT: 18.5 % — AB (ref 36.0–46.0)
HEMOGLOBIN: 6.2 g/dL — AB (ref 12.0–15.0)
LYMPHS ABS: 0.9 10*3/uL (ref 0.7–4.0)
LYMPHS PCT: 4 %
MCH: 29.1 pg (ref 26.0–34.0)
MCHC: 33.5 g/dL (ref 30.0–36.0)
MCV: 86.9 fL (ref 78.0–100.0)
MONOS PCT: 3 %
Monocytes Absolute: 0.7 10*3/uL (ref 0.1–1.0)
NEUTROS ABS: 20.7 10*3/uL — AB (ref 1.7–7.7)
Neutrophils Relative %: 93 %
Platelets: 238 10*3/uL (ref 150–400)
RBC: 2.13 MIL/uL — ABNORMAL LOW (ref 3.87–5.11)
RDW: 15.8 % — AB (ref 11.5–15.5)
WBC: 22.3 10*3/uL — ABNORMAL HIGH (ref 4.0–10.5)

## 2017-08-02 LAB — CBC
HCT: 21.1 % — ABNORMAL LOW (ref 36.0–46.0)
Hemoglobin: 6.9 g/dL — CL (ref 12.0–15.0)
MCH: 28.4 pg (ref 26.0–34.0)
MCHC: 32.7 g/dL (ref 30.0–36.0)
MCV: 86.8 fL (ref 78.0–100.0)
PLATELETS: 223 10*3/uL (ref 150–400)
RBC: 2.43 MIL/uL — AB (ref 3.87–5.11)
RDW: 15.6 % — ABNORMAL HIGH (ref 11.5–15.5)
WBC: 15.7 10*3/uL — ABNORMAL HIGH (ref 4.0–10.5)

## 2017-08-02 LAB — PREPARE RBC (CROSSMATCH)

## 2017-08-02 LAB — GLUCOSE, CAPILLARY
GLUCOSE-CAPILLARY: 146 mg/dL — AB (ref 65–99)
GLUCOSE-CAPILLARY: 116 mg/dL — AB (ref 65–99)
Glucose-Capillary: 172 mg/dL — ABNORMAL HIGH (ref 65–99)
Glucose-Capillary: 114 mg/dL — ABNORMAL HIGH (ref 65–99)

## 2017-08-02 LAB — HEMOGLOBIN AND HEMATOCRIT, BLOOD
HCT: 23.9 % — ABNORMAL LOW (ref 36.0–46.0)
HEMATOCRIT: 19.4 % — AB (ref 36.0–46.0)
HEMOGLOBIN: 7.9 g/dL — AB (ref 12.0–15.0)
Hemoglobin: 6.3 g/dL — CL (ref 12.0–15.0)

## 2017-08-02 LAB — BASIC METABOLIC PANEL
Anion gap: 6 (ref 5–15)
BUN: 28 mg/dL — AB (ref 6–20)
CALCIUM: 7.3 mg/dL — AB (ref 8.9–10.3)
CHLORIDE: 118 mmol/L — AB (ref 101–111)
CO2: 16 mmol/L — AB (ref 22–32)
CREATININE: 1.67 mg/dL — AB (ref 0.44–1.00)
GFR calc Af Amer: 36 mL/min — ABNORMAL LOW (ref >60)
GFR calc non Af Amer: 31 mL/min — ABNORMAL LOW (ref >60)
GLUCOSE: 200 mg/dL — AB (ref 65–99)
Potassium: 3.7 mmol/L (ref 3.5–5.1)
Sodium: 140 mmol/L (ref 135–145)

## 2017-08-02 MED ORDER — STERILE WATER FOR INJECTION IV SOLN
INTRAVENOUS | Status: DC
  Administered 2017-08-02: 13:00:00 via INTRAVENOUS
  Filled 2017-08-02 (×2): qty 850

## 2017-08-02 MED ORDER — FOLIC ACID 1 MG PO TABS
1.0000 mg | ORAL_TABLET | Freq: Every day | ORAL | Status: DC
  Administered 2017-08-04 – 2017-08-10 (×7): 1 mg via ORAL
  Filled 2017-08-02 (×7): qty 1

## 2017-08-02 MED ORDER — FUROSEMIDE 10 MG/ML IJ SOLN
20.0000 mg | Freq: Once | INTRAMUSCULAR | Status: AC
  Administered 2017-08-02: 20 mg via INTRAVENOUS
  Filled 2017-08-02: qty 2

## 2017-08-02 MED ORDER — SODIUM CHLORIDE 0.9 % IV SOLN
Freq: Once | INTRAVENOUS | Status: AC
  Administered 2017-08-04: 07:00:00 via INTRAVENOUS

## 2017-08-02 MED ORDER — SODIUM CHLORIDE 0.9 % IV SOLN
Freq: Once | INTRAVENOUS | Status: AC
  Administered 2017-08-05: 18:00:00 via INTRAVENOUS

## 2017-08-02 MED ORDER — ACETAMINOPHEN 160 MG/5ML PO SOLN
650.0000 mg | Freq: Four times a day (QID) | ORAL | Status: DC | PRN
  Administered 2017-08-09: 650 mg via ORAL
  Filled 2017-08-02: qty 20.3

## 2017-08-02 NOTE — Progress Notes (Signed)
Occupational Therapy Treatment Patient Details Name: Lori [name] MRN: [id number] DOB: July 05, 1951 Today's Date: 08/02/2017    History of present illness pt is a 66 y/o female with pmh significant for DM , Seizure d/o HTN, h/o brain aneurysm, cervical CA, metastatic mucinous adenocarcinoma of the appendix, admitted from SNF with c/o bloody bowel movements.  In hospital pt appeared to have seizure resulting in encephalopathy.  CT/MRI's suggest L thalamic, L cerebellar and right frontal lobe encephalomalacia   OT comments  This 66 yo female admitted with above presents to acute OT with working on precursors to basic ADLs (bed mobility and sitting balance). Pt willing to work on it and was happy that her family (sister) saw that she was trying. Will continue to follow.  Follow Up Recommendations  SNF;Supervision/Assistance - 24 hour    Equipment Recommendations  None recommended by OT       Precautions / Restrictions Precautions Precautions: Fall Precaution Comments: foley Restrictions Weight Bearing Restrictions: No       Mobility Bed Mobility Overal bed mobility: Needs Assistance Bed Mobility: Rolling;Sit to Sidelying;Supine to Sit Rolling: Total assist (can initiate reach with RUE for opposite bed rail, but unable to then pull herself over)   Supine to sit: Total assist;+2 for physical assistance;HOB elevated   Sit to sidelying: Total assist;+2 for physical assistance         Balance Overall balance assessment: Needs assistance Sitting-balance support: Single extremity supported;Feet supported Sitting balance-Leahy Scale: Poor Sitting balance - Comments: sat EOB x15 min working on sitting balance, upright midline posture, kicking each leg out several reps, reaching, and holding head up.  Pt needed min to max assist with posterior and right lateral lean.  Postural control: Posterior lean;Right lateral lean                                 ADL either  performed or assessed with clinical judgement        Vision Patient Visual Report: No change from baseline            Cognition Arousal/Alertness: Awake/alert Behavior During Therapy: WFL for tasks assessed/performed (some smiling and carrying onto with siblings in room) Overall Cognitive Status: Impaired/Different from baseline Area of Impairment: Following commands;Safety/judgement;Problem solving                       Following Commands: Follows one step commands with increased time (or tries, but cannot (weak)) Safety/Judgement: Decreased awareness of deficits;Decreased awareness of safety   Problem Solving: Slow processing;Decreased initiation;Difficulty sequencing;Requires verbal cues;Requires tactile cues General Comments: Pt very willing to participate today                   Pertinent Vitals/ Pain       Pain Assessment: No/denies pain         Frequency  Min 2X/week        Progress Toward Goals  OT Goals(current goals can now be found in the care plan section)  Progress towards OT goals: Progressing toward goals (for sitting balance)     Plan Discharge plan remains appropriate    Co-evaluation    PT/OT/SLP Co-Evaluation/Treatment: Yes Reason for Co-Treatment: For patient/therapist safety;To address functional/ADL transfers   OT goals addressed during session: Strengthening/ROM      AM-PAC PT "6 Clicks" Daily Activity     Outcome Measure   Help from another person eating meals?: A  Lot Help from another person taking care of personal grooming?: A Lot Help from another person toileting, which includes using toliet, bedpan, or urinal?: Total Help from another person bathing (including washing, rinsing, drying)?: Total Help from another person to put on and taking off regular upper body clothing?: Total Help from another person to put on and taking off regular lower body clothing?: Total 6 Click Score: 8    End of Session    OT  Visit Diagnosis: Other abnormalities of gait and mobility (R26.89);Muscle weakness (generalized) (M62.81);Other symptoms and signs involving cognitive function   Activity Tolerance Patient tolerated treatment well   Patient Left in bed;with call bell/phone within reach;with family/visitor present   Nurse Communication Mobility status        Time: 3254-9826 OT Time Calculation (min): 35 min  Charges: OT General Charges $OT Visit: 1 Visit OT Treatments $Therapeutic Activity: 8-22 mins  [address], OTR/L [phone] 08/02/2017  '

## 2017-08-02 NOTE — Progress Notes (Signed)
Notified MD of pt's post transfusion Hgb 6.2. New orders placed. Will continue. Isac Caddy, RN

## 2017-08-02 NOTE — Progress Notes (Signed)
Pt had a pool of dark red liquid blood under her, presumably from rectum as Pt has foley draining yellow urine.  Cleansed, changed, new sacral dressing applied, positioned with pillows and made as comfortable as possible.  Will make MD aware.

## 2017-08-02 NOTE — Progress Notes (Signed)
New Admission Note:   Arrival Method: From 4N via bed Mental Orientation: A&OX4 Assessment: Completed IV: R chest port cath Pain: pt denies pain Tubes: None Safety Measures: Safety Fall Prevention Plan has been discussed  Admission 4East Orientation: Patient has been orientated to the room, unit and staff.  Family: son and grandson at bedside  Orders to be reviewed and implemented. Will continue to monitor the patient. Call light has been placed within reach and bed alarm has been activated. Tele applied CCDM notified    Isac Caddy, RN

## 2017-08-02 NOTE — Progress Notes (Signed)
Contacted MD to clarify post transfusion H&H order. Verbal order given to check H&H 1hr post transfusion then Q6hr thereafter. Will continue to monitor. Isac Caddy, RN

## 2017-08-02 NOTE — Progress Notes (Signed)
Pharmacy Antibiotic Note  Lori [name] is a 66 y.o. female admitted on 06/23/2017 with intra-abdominal infection and yeast in urine culture.  Pharmacy has been consulted for Unasyn and Fluconazole dosing.  Pt has been on Zosyn with WBC trending up again & possible intra-abd abscess, narrowing abx to Unasyn.  Fluconazole also being added for yeast in UTI, but pt did have bowel perforation earlier so will go with higher dose than for UTI.  Patient is afebrile but WBC 22.3 remains elevated and patient has required several blood transfusions. HgB 6.2 this morning despite 2 uPRBC overnight.   Plan: Unasyn 3g IV q8 Fluconazole 200mg  IV q24 Watch renal function F/U repeat blood and urine cultures  Height: 5\' 8"  (172.7 cm) Weight: 220 lb (99.8 kg) IBW/kg (Calculated) : 63.9  Temp (24hrs), Avg:98.7 F (37.1 C), Min:98.2 F (36.8 C), Max:99.2 F (37.3 C)   Recent Labs Lab 07/29/17 0358 07/29/17 1516 07/30/17 0431 07/30/17 2056 07/31/17 0347 08/02/17 0423  WBC 18.0* 23.6* 22.8*  --  25.3* 22.3*  CREATININE 1.11*  --  1.43* 1.68* 1.18* 1.67*    Estimated Creatinine Clearance: 41 mL/min (A) (by C-G formula based on SCr of 1.67 mg/dL (H)).    Allergies  Allergen Reactions  . Contrast Media [Iodinated Diagnostic Agents] Itching and Swelling  . Dilantin [Phenytoin Sodium Extended] Itching, Swelling and Other (See Comments)    Whole body  . Latex Hives and Itching  . Adhesive [Tape] Rash    Vancomycin 8/18 >>8/21 Zosyn 7/22 >>7/26; 8/18 >> 8/27 Cefepime 7/26 >>7/31 Flagyl 7/26 >> 7/28 Unasyn 7/31>>8/7; 8/27 >> Fluconazole 8/27 >>  7/21 UCx: [employer]) but w/out sxs 722 BCx: ngf 8/8 MRSA PCR: neg 8/17 Cdiff: neg 8/17 GI panel: neg 8/18 BCxr x 2>> neg 8/18 UCx: multiple species 8/26 Urine 8/27 blood x 2  Thank you for allowing pharmacy to be a part of this patient's care.  Georga Bora, PharmD Clinical Pharmacist 08/02/2017 10:08 AM

## 2017-08-02 NOTE — Care Management Important Message (Signed)
Important Message  Patient Details  Name: Lori [name] MRN: [id number] Date of Birth: 26-Jul-1951   Medicare Important Message Given:  Yes    [name] May 08/02/2017, 9:42 AM

## 2017-08-02 NOTE — Progress Notes (Signed)
PT Cancellation Note  Patient Details Name: Lori [name] MRN: [id number] DOB: 22-Feb-1951   Cancelled Treatment:    Reason Eval/Treat Not Completed: Medical issues which prohibited therapy Pt with Hgb 6.3 with elevated HR and RR. PT will continue to follow acutely.    Salina April, PTA Pager: [phone]   08/02/2017, 8:37 AM

## 2017-08-02 NOTE — Progress Notes (Signed)
Physical Therapy Treatment Patient Details Name: Lori [name] MRN: [id number] DOB: 08-27-1951 Today's Date: 08/02/2017    History of Present Illness pt is a 66 y/o female with pmh significant for DM , Seizure d/o HTN, h/o brain aneurysm, cervical CA, metastatic mucinous adenocarcinoma of the appendix, admitted from SNF with c/o bloody bowel movements.  In hospital pt appeared to have seizure resulting in encephalopathy.  CT/MRI's suggest L thalamic, L cerebellar and right frontal lobe encephalomalacia and pt has had significant issues with drop in Hgb.    PT Comments    Patient was agreeable to participate in therapy and tolerated session well. This session focused on bed mobility and sitting balance EOB. Pt was able to sit EOB 15 mins with min to max A. Continue to progress as tolerated.   Follow Up Recommendations  SNF;Supervision/Assistance - 24 hour     Equipment Recommendations  Wheelchair (measurements PT)    Recommendations for Other Services       Precautions / Restrictions Precautions Precautions: Fall Precaution Comments: foley Restrictions Weight Bearing Restrictions: No    Mobility  Bed Mobility Overal bed mobility: Needs Assistance Bed Mobility: Rolling;Sit to Sidelying;Supine to Sit Rolling: Total assist (can initiate reach with RUE for opposite bed rail, but unable to then pull herself over) Sidelying to sit: Total assist;HOB elevated;+2 for physical assistance Supine to sit: Total assist;+2 for physical assistance;HOB elevated Sit to supine: Total assist;+2 for physical assistance Sit to sidelying: Total assist;+2 for physical assistance General bed mobility comments: asssist for all aspects of bed mobility however pt assisted as she was able with R UE when rolling and able to initiate coming onto R elbow to return to sidelying  Transfers                    Ambulation/Gait                 Stairs            Wheelchair Mobility     Modified Rankin (Stroke Patients Only)       Balance Overall balance assessment: Needs assistance Sitting-balance support: Single extremity supported;Feet supported Sitting balance-Leahy Scale: Poor Sitting balance - Comments: sat EOB x15 min working on sitting balance, midline posture and cervical extension/scapular retraction with facilitation of anterior pelvic tilt and trunk extension; pt able to work on bilat LE/UE therex and reaching outside BOS in sitting with assistance; Pt needed min to max assist with posterior and left lateral lean Postural control: Posterior lean;Left lateral lean                                  Cognition Arousal/Alertness: Awake/alert Behavior During Therapy: WFL for tasks assessed/performed (some smiling and carrying on with siblings in room) Overall Cognitive Status: Impaired/Different from baseline Area of Impairment: Following commands;Safety/judgement;Problem solving                   Current Attention Level: Sustained   Following Commands: Follows one step commands with increased time (or tries, but cannot (weak)) Safety/Judgement: Decreased awareness of deficits;Decreased awareness of safety Awareness: Intellectual Problem Solving: Slow processing;Decreased initiation;Difficulty sequencing;Requires verbal cues;Requires tactile cues General Comments: Pt very willing to participate today      Exercises General Exercises - Lower Extremity Long Arc Quad: Both;Seated;AAROM Toe Raises: AROM;Both;Seated;15 reps Other Exercises Other Exercises: resisted knee flexion    General Comments  Pertinent Vitals/Pain Pain Assessment: No/denies pain Pain Intervention(s): Monitored during session    Home Living                      Prior Function            PT Goals (current goals can now be found in the care plan section) Acute Rehab PT Goals PT Goal Formulation: With patient Time For Goal Achievement:  08/06/17 Potential to Achieve Goals: Fair Progress towards PT goals: Progressing toward goals    Frequency    Min 2X/week      PT Plan Current plan remains appropriate    Co-evaluation PT/OT/SLP Co-Evaluation/Treatment: Yes Reason for Co-Treatment: For patient/therapist safety;To address functional/ADL transfers PT goals addressed during session: Mobility/safety with mobility;Strengthening/ROM OT goals addressed during session: Strengthening/ROM      AM-PAC PT "6 Clicks" Daily Activity  Outcome Measure  Difficulty turning over in bed (including adjusting bedclothes, sheets and blankets)?: Unable Difficulty moving from lying on back to sitting on the side of the bed? : Unable Difficulty sitting down on and standing up from a chair with arms (e.g., wheelchair, bedside commode, etc,.)?: Unable Help needed moving to and from a bed to chair (including a wheelchair)?: Total Help needed walking in hospital room?: Total Help needed climbing 3-5 steps with a railing? : Total 6 Click Score: 6    End of Session   Activity Tolerance: Patient tolerated treatment well Patient left: in bed;with call bell/phone within reach;with family/visitor present Nurse Communication: Mobility status;Need for lift equipment PT Visit Diagnosis: Other abnormalities of gait and mobility (R26.89);Other symptoms and signs involving the nervous system (R29.898);Muscle weakness (generalized) (M62.81) Pain - Right/Left: Left Pain - part of body:  (abdomen)     Time: 4259-5638 PT Time Calculation (min) (ACUTE ONLY): 35 min  Charges:  $Therapeutic Activity: 8-22 mins                    G Codes:       Earney Navy, PTA Pager: [phone]     Darliss Cheney 08/02/2017, 5:20 PM

## 2017-08-02 NOTE — Progress Notes (Addendum)
PROGRESS NOTE    Lori [name]  JYN:[id number] DOB: [birth year] DOA: 06/23/2017 PCP: [name], MD   Chief Complaint  Patient presents with  . 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 She was at a skilled nursing facility. She was recently hospitalized for small bowel obstruction and underwent diagnostic laparoscopy in June. She underwent repair of small bowel perforation. She had a prolonged hospital stay. Subsequently was discharged to skilled nursing facility. Presented back with rectal bleeding. Seen by oncology. Not thought to be a candidate for any treatment. Then she had what appears to be a seizure resulting in encephalopathy. Neurology was consulted. Palliative medicine continues to follow. Family still desires aggressive care. Patient was transferred to stepdown unit. Critical care medicine was consulted. Patient noted to have profuse urine output. 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 Patient improving. Had MBS, started on dysphagia 2 diet however still poor oral intake, therefore NGT was placed. Patient transitioned to oral Keppra, Tegretol. Mental status improving. Neurology felt no need for lumbar puncture as mental status has been improving.  Patient's WBC/renal function now worsening again. Was placed on IVF, creatinine fluctuating, however WBC continuing to rise. Checking Cdiff again (neg 8/17 & 8/28). Acute blood loss anemia, ongoing. Hemoglobin dropped to 4.3 on 8/29. Ongoing PRBC transfusions. Patient's family/son did not want palliative care team to follow and they signed off on 8/29.  Assessment & Plan   Severe sepsis secondary to pneumatosis and bowel ischemia -Placed on vancomycin and Zosyn initially however  vancomycin was discontinued as there is no evidence of MRSA -Patient still not candidate for surgery per general surgery -Blood cultures2 from 8/18: Negative, final report. Urine culture from 8/26: Multiple species, possibly contamination. -Surgery recommended medical management and continuing oral antibiotics for additional 5 days (when able to tolerate diet) -NG tube removed -leukocytosis had improved, but now worsening. Feel this is likely reactive. Currently on Unasyn and fluconazole since 8/26  - Chest x-ray 8/26 showed subtle densities in right lung base which may be atelectasis and a small effusion. - Leukocytosis has slightly improved from 25.3 > 22.3.  Worsening leukocytosis -Thought to be secondary to the above however patient was on Zosyn -Obtained repeat chest x-ray on 8/26: Unchanged from prior chest x-rays -UA 07/29/2017 shows a cloudy appearance with moderate leukocytes positive nitrites, many bacteria and TNTC WBC, 6-30 sq epi cells, budding yeast (different from prior UAs this admission, patient was treated for Enterococcus UTI with unasyn) -Urine culture showed multiple species -Repeat Blood cultures, no further cultures pending. -currently afebrile -Transitioned to unasyn and started on fluconazole (yeast found on UA) -Repeat C. difficile testing negative. - Continue to monitor CBCs.  Acute kidney injury -Creatinine bumped to 1.68 -Patient was started on IVF, Zosyn was discontinued -Creatinine improved today 1.18, but has gone up again to 1.67 and associated with non-anion gap metabolic acidosis. - Continue to monitor BMP -Severely volume overloaded. Etiology of acute kidney injury unclear. Change IV fluids to brief and gentle bicarbonate drip. Also provided dose of IV Lasix 20 mg 1 to see if it will improve urine output, decrease anasarca and helped to improve her renal functions.  Breakthrough seizure/history of seizure disorder -Stable -Neurology consulted and  appreciated -Patient placed on IV Keppra and Vimpat -Neurology recommended continuing keppra 1g BID, Vimpat  100mg  BID  - Discussed with Dr. Rory Percy, will discontinue tegretol (initally held as patient was NPO after seizure activity, and it was initially thought that she may not have been compliant) -CT head on 07/21/17: no definite CT evidence for acute intracranial abnormality  -Tegretol discontinued -Continue seizure precautions  Acute metabolic encephalopathy/Left Thalamic Stroke/ Bilateral small SDH -Patient was noted to have garbled speech on the afternoon of 06/28/2017 -Neurology consultation appreciated -CT head on 8/1: Evolving encephalomalacia at suspected left thalamic lacunar infarct. Previously identified tiny bilateral subdural hematoma -MRI brain on 7/29: New tiny bilateral subdural hematomas, trace subarachnoid hemorrhage, punctate focus of diffusion abnormality in the dorsal thalamus -It was thought the patient was having seizure activity and she was loaded with Keppra -Patient has had continuous EEG monitoring: Progressively improving diffuse encephalopathy  -Neurology feels lumbar puncture is no longer necessary given that her mental status is improving -Patient had modified barium swallow, was placed on dysphagia 2 diet, however was made NPO due to seizure  -Continues to have improved mental status, currently AAOx2 -transitioned to dysphagia 2 diet- tolerated well. Mental status changes may have resolved.  Diabetes insipidus/polyuria/Hypernatremia -Patient had excessive urine output thought to be due to central diabetes insipidus due to abnormalities noted on MRI of the brain -Resolved however patient was then placed on IV fluids after seizure episode. - Hypernatremia resolved. +9133 mL since admission.? Urine output not being adequately charted.  Primary appendiceal carcinoma with metastatic disease, stage IV with abdominal wound -Oncology consultation appreciated, patient  is not a candidate for chemotherapy and is thought to have an incurable condition. Poor prognosis -Palliative care was consulted but family did not want them to follow up and hence they signed off 8/29. 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 surgery consulted and following- recommended TID wet to dry dressing changes -CT on 8/18: significant pneumatosis most consistent with bowel ischemica. Increase in nodular densities in both lungs suspicious for progressive metastatic disease -Of note, patient completed course of IV Unasyn which was used to treat urinary tract infection and covered her abdominal abscess -Discussed with surgery, ?repeat abdominal imaging maybe- however not sure if there would be any different intervention  Rectal bleeding of unknown cause - patient is not a candidate for endoscopic evaluation due to recent surgery. This was discussed by previous hospitalist with general surgery.Ongoing rectal bleeding. Transfuse as needed to keep hemoglobin >7. Unfortunately extremely poor prognosis but family insisting on ongoing aggressive care.  Anemia secondary to acute blood loss - Recurrent significant drops in hemoglobin secondary to ongoing rectal bleeding. Hemoglobin down to 4.3 on 8/29. A unit of PRBC transfusion ongoing this morning. Hemoglobin prior to that unit is 6.2. Follow CBCs posttransfusion and may need further transfusions to keep hemoglobin >7 g per DL.   Diabetes mellitus, type II -Continue insulin sliding scale CBG monitoring. Reasonable inpatient control.  Essential hypertension -Continue to remain stable -Continue clonidine, and PRN hydralazine  Enterococcus UTI -resolved, treated with unasyn  Nutrition She was encephalopathic with poor oral intake -Was seen by nutrition/speech therapy and placed on dysphagia diet however patient did have a seizure -NG tube was  removed -Likely not a good candidate for PEG tube given her carcinomatosis -transitioned to dysphagia II diet and seems to be tolerating.  Hypokalemia/hypomagnesemia -resolved, continue to monitor periodically and replace as needed.   Stage II pressure ulcer of the buttocks -Continue wound care  Goals of care -  Palliative care was consulted and appreciated however family wishes to continue aggressive care -Feel patient has an overall poor prognosis given her poor oral intake, seizure activity, multiple comorbidities, stage IV appendiceal cancer -patient continues to decline despite antibiotics and intervention  - Family did not want palliative care to continue to see and they signed off on 8/29. - ? Consider ethics consultation.  DVT Prophylaxis  SCDs  Code Status: Full  Family Communication: Discussed with patient's son at bedside.  Disposition Plan: Admitted. Will continue to work up worsening leukocytosis. Dispo TBD.   Consultants PCCM General surgery Neurology  Oncology Palliative care  Procedures  Continuous EEG  Antibiotics   Anti-infectives    Start     Dose/Rate Route Frequency Ordered Stop   07/30/17 1300  Ampicillin-Sulbactam (UNASYN) 3 g in sodium chloride 0.9 % 100 mL IVPB     3 g 200 mL/hr over 30 Minutes Intravenous Every 8 hours 07/30/17 1208     07/30/17 1300  fluconazole (DIFLUCAN) IVPB 200 mg     200 mg 100 mL/hr over 60 Minutes Intravenous Every 24 hours 07/30/17 1208     07/22/17 0500  vancomycin (VANCOCIN) IVPB 1000 mg/200 mL premix  Status:  Discontinued     1,000 mg 200 mL/hr over 60 Minutes Intravenous Every 12 hours 07/21/17 1545 07/24/17 1047   07/21/17 2200  piperacillin-tazobactam (ZOSYN) IVPB 3.375 g  Status:  Discontinued     3.375 g 12.5 mL/hr over 240 Minutes Intravenous Every 8 hours 07/21/17 1545 07/30/17 1123   07/21/17 1630  vancomycin (VANCOCIN) 1,500 mg in sodium chloride 0.9 % 500 mL IVPB     1,500 mg 250 mL/hr over 120 Minutes  Intravenous  Once 07/21/17 1545 07/22/17 0000   07/21/17 1500  piperacillin-tazobactam (ZOSYN) IVPB 3.375 g     3.375 g 100 mL/hr over 30 Minutes Intravenous  Once 07/21/17 1449 07/21/17 1638   07/04/17 1100  Ampicillin-Sulbactam (UNASYN) 3 g in sodium chloride 0.9 % 100 mL IVPB     3 g 200 mL/hr over 30 Minutes Intravenous Every 6 hours 07/04/17 0823 07/10/17 0429   07/03/17 1600  Ampicillin-Sulbactam (UNASYN) 3 g in sodium chloride 0.9 % 100 mL IVPB  Status:  Discontinued     3 g 200 mL/hr over 30 Minutes Intravenous Every 6 hours 07/03/17 1047 07/04/17 0823   06/29/17 1400  ceFEPIme (MAXIPIME) 2 g in dextrose 5 % 50 mL IVPB  Status:  Discontinued     2 g 100 mL/hr over 30 Minutes Intravenous Every 8 hours 06/29/17 0853 06/29/17 0857   06/29/17 1000  ceFEPIme (MAXIPIME) 2 g in dextrose 5 % 50 mL IVPB  Status:  Discontinued     2 g 100 mL/hr over 30 Minutes Intravenous Every 8 hours 06/29/17 0857 07/03/17 1046   06/29/17 0930  metroNIDAZOLE (FLAGYL) IVPB 500 mg  Status:  Discontinued     500 mg 100 mL/hr over 60 Minutes Intravenous Every 8 hours 06/29/17 0838 06/30/17 0956   06/24/17 0600  piperacillin-tazobactam (ZOSYN) IVPB 3.375 g  Status:  Discontinued     3.375 g 12.5 mL/hr over 240 Minutes Intravenous Every 8 hours 06/23/17 2254 06/29/17 0838   06/23/17 2300  piperacillin-tazobactam (ZOSYN) IVPB 3.375 g     3.375 g 100 mL/hr over 30 Minutes Intravenous  Once 06/23/17 2254 06/24/17 0056      Subjective:   Lori [name] seen and examined today. Overnight events noted. Issues with rectal bleeding and frequent blood transfusions. Patient  states that she feels "okay". As per son at bedside, she did not get to sleep until early this morning. He also reports swelling of her body. Patient denies dyspnea or chest pain.  Objective:   Vitals:   08/02/17 0229 08/02/17 0400 08/02/17 1003 08/02/17 1028  BP: 131/72  134/76   Pulse: (!) 108  (!) 108 (!) 102  Resp: (!) 29  19 19     Temp: 98.7 F (37.1 C) 98.6 F (37 C) 98.3 F (36.8 C) 98.5 F (36.9 C)  TempSrc: Oral Oral Oral Oral  SpO2: 97%  98% 96%  Weight:      Height:        Intake/Output Summary (Last 24 hours) at 08/02/17 1154 Last data filed at 08/02/17 1028  Gross per 24 hour  Intake           3748.5 ml  Output                0 ml  Net           3748.5 ml   Filed Weights   07/29/17 0649 07/30/17 0500 08/01/17 0446  Weight: 99.6 kg (219 lb 10.3 oz) 98.9 kg (218 lb) 99.8 kg (220 lb)   Exam  General: Well developed, Chronically ill-appearing, no apparent distress. Lying comfortably propped up in bed.   HEENT: NCAT, mucous membranes moist.   Cardiovascular: S1 S2 auscultated, tachycardic, no murmurs. Telemetry: Sinus tachycardia in the 110s.   Respiratory:Reduced breath sounds in the bases but otherwise clear to auscultation. No increased work of breathing.   Abdomen: Soft, nontender, nondistended, + bowel sounds, dressing in place which is clean and dry. Foley catheter +.   Extremities: warm dry without cyanosis clubbing.  2+ pitting all extremity edema, especially lower extremities.   Neuro: AAOx3, nonfocal  Data Reviewed: I have personally reviewed following labs and imaging studies  CBC:  Recent Labs Lab 07/29/17 0358 07/29/17 1516 07/30/17 0431 07/31/17 0347 07/31/17 1400 08/01/17 1456 08/01/17 2046 08/02/17 0423  WBC 18.0* 23.6* 22.8* 25.3*  --   --   --  22.3*  NEUTROABS  --  21.3* 20.8*  --   --   --   --  20.7*  HGB 8.0* 8.5* 7.5* 6.1* 7.9* 4.3* 5.3* 6.2*  6.3*  HCT 26.4* 28.2* 25.1* 20.0* 24.9* 13.3* 16.4* 18.5*  19.4*  MCV 88.3 88.4 89.3 88.1  --   --   --  86.9  PLT 519* 632* 590* 482*  --   --   --  967   Basic Metabolic Panel:  Recent Labs Lab 07/27/17 0457 07/28/17 0433 07/29/17 0358 07/30/17 0431 07/30/17 2056 07/31/17 0347 08/02/17 0423  NA 143 139 139 139 138 138 140  K 3.0* 3.7 4.7 5.5* 4.8 4.8 3.7  CL 113* 112* 113* 113* 113* 114* 118*  CO2 22  21* 20* 21* 19* 19* 16*  GLUCOSE 130* 111* 99 153* 117* 136* 200*  BUN 6 5* 6 14 20  23* 28*  CREATININE 0.56 0.69 1.11* 1.43* 1.68* 1.18* 1.67*  CALCIUM 7.6* 7.4* 7.6* 7.5* 7.4* 7.4* 7.3*  MG 1.6* 1.6* 2.4 2.3  --   --   --    GFR: Estimated Creatinine Clearance: 41 mL/min (A) (by C-G formula based on SCr of 1.67 mg/dL (H)). Liver Function Tests: No results for input(s): AST, ALT, ALKPHOS, BILITOT, PROT, ALBUMIN in the last 168 hours. No results for input(s): LIPASE, AMYLASE in the last 168 hours. No results for input(s):  AMMONIA in the last 168 hours. Coagulation Profile: No results for input(s): INR, PROTIME in the last 168 hours. Cardiac Enzymes: No results for input(s): CKTOTAL, CKMB, CKMBINDEX, TROPONINI in the last 168 hours. BNP (last 3 results) No results for input(s): PROBNP in the last 8760 hours. HbA1C: No results for input(s): HGBA1C in the last 72 hours. CBG:  Recent Labs Lab 08/01/17 1323 08/01/17 1709 08/01/17 2218 08/02/17 0622 08/02/17 1118  GLUCAP 111* 156* 138* 172* 146*   Lipid Profile: No results for input(s): CHOL, HDL, LDLCALC, TRIG, CHOLHDL, LDLDIRECT in the last 72 hours. Thyroid Function Tests: No results for input(s): TSH, T4TOTAL, FREET4, T3FREE, THYROIDAB in the last 72 hours. Anemia Panel: No results for input(s): VITAMINB12, FOLATE, FERRITIN, TIBC, IRON, RETICCTPCT in the last 72 hours. Urine analysis:    Component Value Date/Time   COLORURINE YELLOW 07/29/2017 1456   APPEARANCEUR CLOUDY (A) 07/29/2017 1456   LABSPEC 1.010 07/29/2017 1456   LABSPEC 1.010 08/17/2015 1542   PHURINE 5.5 07/29/2017 1456   GLUCOSEU NEGATIVE 07/29/2017 1456   GLUCOSEU 100 08/17/2015 1542   HGBUR TRACE (A) 07/29/2017 1456   BILIRUBINUR NEGATIVE 07/29/2017 1456   BILIRUBINUR Negative 08/17/2015 1542   KETONESUR NEGATIVE 07/29/2017 1456   PROTEINUR NEGATIVE 07/29/2017 1456   UROBILINOGEN 1.0 08/26/2015 0725   UROBILINOGEN 0.2 08/17/2015 1542   NITRITE  POSITIVE (A) 07/29/2017 1456   LEUKOCYTESUR MODERATE (A) 07/29/2017 1456   LEUKOCYTESUR Trace 08/17/2015 1542    Recent Results (from the past 240 hour(s))  Urine Culture     Status: Abnormal   Collection Time: 07/29/17  2:56 PM  Result Value Ref Range Status   Specimen Description URINE, RANDOM  Final   Special Requests NONE  Final   Culture MULTIPLE SPECIES PRESENT, SUGGEST RECOLLECTION (A)  Final   Report Status 07/30/2017 FINAL  Final  C difficile quick scan w PCR reflex     Status: None   Collection Time: 07/31/17 12:40 PM  Result Value Ref Range Status   C Diff antigen NEGATIVE NEGATIVE Final   C Diff toxin NEGATIVE NEGATIVE Final   C Diff interpretation No C. difficile detected.  Final      Radiology Studies: No results found.   Scheduled Meds: . feeding supplement  1 Container Oral TID BM  . folic acid  1 mg Per Tube Daily  . insulin aspart  0-15 Units Subcutaneous TID WC  . insulin aspart  0-5 Units Subcutaneous QHS  . magnesium chloride  1 tablet Oral Daily  . mirtazapine  7.5 mg Oral QHS  . multivitamin with minerals  1 tablet Oral Daily   Continuous Infusions: . sodium chloride    . sodium chloride 75 mL/hr at 08/02/17 0235  . sodium chloride    . sodium chloride    . ampicillin-sulbactam (UNASYN) IV Stopped (08/02/17 0535)  . fluconazole (DIFLUCAN) IV Stopped (08/01/17 1413)  . lacosamide (VIMPAT) IV Stopped (08/01/17 2305)  . levETIRAcetam 1,000 mg (08/02/17 0803)     LOS: 40 days   Time Spent in minutes   89 minutes  Lavante Toso, MD, FACP, FHM. [facility] Pager [phone]  If 7PM-7AM, please contact night-coverage [email or web address] Password [email or web address] 08/02/2017, 12:12 PM

## 2017-08-03 LAB — GLUCOSE, CAPILLARY
GLUCOSE-CAPILLARY: 84 mg/dL (ref 65–99)
Glucose-Capillary: 109 mg/dL — ABNORMAL HIGH (ref 65–99)
Glucose-Capillary: 110 mg/dL — ABNORMAL HIGH (ref 65–99)
Glucose-Capillary: 103 mg/dL — ABNORMAL HIGH (ref 65–99)

## 2017-08-03 LAB — CBC
HCT: 26.5 % — ABNORMAL LOW (ref 36.0–46.0)
HEMOGLOBIN: 8.5 g/dL — AB (ref 12.0–15.0)
MCH: 28.1 pg (ref 26.0–34.0)
MCHC: 32.1 g/dL (ref 30.0–36.0)
MCV: 87.7 fL (ref 78.0–100.0)
PLATELETS: 196 10*3/uL (ref 150–400)
RBC: 3.02 MIL/uL — AB (ref 3.87–5.11)
RDW: 15.6 % — ABNORMAL HIGH (ref 11.5–15.5)
WBC: 14.8 10*3/uL — AB (ref 4.0–10.5)

## 2017-08-03 LAB — BASIC METABOLIC PANEL
ANION GAP: 7 (ref 5–15)
BUN: 24 mg/dL — ABNORMAL HIGH (ref 6–20)
CALCIUM: 7.3 mg/dL — AB (ref 8.9–10.3)
CO2: 17 mmol/L — ABNORMAL LOW (ref 22–32)
CREATININE: 1.56 mg/dL — AB (ref 0.44–1.00)
Chloride: 120 mmol/L — ABNORMAL HIGH (ref 101–111)
GFR, EST AFRICAN AMERICAN: 39 mL/min — AB (ref >60)
GFR, EST NON AFRICAN AMERICAN: 34 mL/min — AB (ref >60)
Glucose, Bld: 112 mg/dL — ABNORMAL HIGH (ref 65–99)
Potassium: 3 mmol/L — ABNORMAL LOW (ref 3.5–5.1)
SODIUM: 144 mmol/L (ref 135–145)

## 2017-08-03 LAB — HEMOGLOBIN AND HEMATOCRIT, BLOOD
HEMATOCRIT: 26.4 % — AB (ref 36.0–46.0)
HEMOGLOBIN: 8.6 g/dL — AB (ref 12.0–15.0)

## 2017-08-03 MED ORDER — SOD CITRATE-CITRIC ACID 500-334 MG/5ML PO SOLN
30.0000 mL | Freq: Three times a day (TID) | ORAL | Status: DC
  Administered 2017-08-03: 15 mL via ORAL
  Administered 2017-08-03 – 2017-08-09 (×21): 30 mL via ORAL
  Filled 2017-08-03 (×29): qty 30

## 2017-08-03 MED ORDER — POTASSIUM CHLORIDE 20 MEQ PO PACK
40.0000 meq | PACK | Freq: Once | ORAL | Status: AC
  Administered 2017-08-03: 20 meq via ORAL
  Filled 2017-08-03: qty 2

## 2017-08-03 NOTE — Progress Notes (Signed)
Notified MD to inform of increase in BP post transfusion and increase in generalized edema. New orders placed. Will continue to monitor. Isac Caddy, RN

## 2017-08-03 NOTE — Progress Notes (Signed)
PROGRESS NOTE    Lori [name]  Lori [name]:[id number] DOB: [birth year] DOA: 06/23/2017 PCP: [name], MD    Brief Narrative:  66 year old female presented with hematochezia. Patient is known to have type 2 diabetes mellitus, seizures, hypertension, GERD, cervical and appendiceal carcinoma, intracranial aneurysm status post repair 1997. Recent laparotomy June 26, for small bowel obstruction, then she was diagnosed with appendiceal adenocarcinoma, discharge to skilled nursing facility, wound VAC was placed. On initial physical examination she was noted to have frank hematochezia with large blood clots per rectum. Blood pressure 166/101, heart rate 94, respiratory rate 28, oxygen saturation 98%. Moist mucous membranes, lungs had decreased breath sounds at bases, positive rales, heart S1-S2 present rhythmic, the abdomen had tenderness to palpation, no rebound, wound VAC was in place, positive significant lower extremity edema +++ pitting up to the sacrum.   Patient was admitted to the hospital working diagnosis of lower GI bleed, complicated with intra-abdominal fluid collections, to rule out abscesses.    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 Acute recurrent blood loss anemia due to lower GI bleed. Patient noted to have persistent blood per rectum, sp blood transfusion, follow hb and hct 8.6-26.4. Very poor prognosis, not candidate for surgery.  2. Resolving sepsis due to bowel ischemia with  pneumatosis. SP extensive laparotomy,  conservative medical care, tolerating po diet. Plan to continue on Unsyn #5.  .   3. Leukocytosis with pyuria. Continue Unasyn, fluconazole added. WBC down to 14 from 15.   4. AKI. Stable renal function with serum cr down to 1.56  from 1,67. Keep hb above 8, will continue to follow renal panel in am, avoid hypotension. Patient had furosemide yesterday. Will hold on bicarbonate drip and will use oral bicarb for non gap metabolic acidosis, likely related to GI losses.   5. Seizures. Patient very deconditioned but able to answer simple questions, will continue with anti seizure regimen with Vimapt and keppra IV, as needed lorazepam.   6. Metabolic encephalopathy. Stable for the last 48 hours, will continue neuro checks per unit protocol.  7. 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 Stable serum Na at 144, will continue close follow up on electrolytes, will continue gentle hydration with IV fluids.  8. 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 Continue to be very poor prognosis.  9. Stage II sacral ulcer.  Continue local wound care.    DVT prophylaxis:  Code Status:  Family Communication:  Disposition Plan:    Consultants:   Palliative care  Surgery  Procedures:     Antimicrobials:  Unasyn   Subjective: Patient with no complains, no nausea or vomiting, poor appetite. Per nursing, positive large and profuse rectal bleeding, constant, with no improving or worsening factors. No associated abdominal pain, fever or chills.   Objective: Vitals:   08/03/17 0044 08/03/17 0118 08/03/17 0442 08/03/17 0756  BP: (!) 145/86 135/88 (!) 138/92 133/88  Pulse: (!) 114 (!) 113  (!) 105  Resp:  12 (!)  0 (!) 28  Temp: 99.8 F (37.7 C) 100 F (37.8 C) 98.1 F (36.7 C) 98.1 F (36.7 C)  TempSrc: Oral Oral Oral Oral  SpO2: 98% 97% 97% 99%  Weight:      Height:        Intake/Output Summary (Last 24  hours) at 08/03/17 0955 Last data filed at 08/03/17 0442  Gross per 24 hour  Intake             1645 ml  Output              175 ml  Net             1470 ml   Filed Weights   07/29/17 0649 07/30/17 0500 08/01/17 0446  Weight: 99.6 kg (219 lb 10.3 oz) 98.9 kg (218 lb) 99.8 kg (220 lb)    Examination:  General: deconditioned and ill looking appearing Neurology: Awake and alert, non focal  E ENT: positive pallor, no icterus, oral mucosa moist Cardiovascular: S1-S2 present, rhythmic, no gallops, rubs, or murmurs. No jugular venous distention, positive trace lower extremity edema. Pulmonary: vesicular breath sounds bilaterally, adequate air movement, no wheezing, rhonchi or rales. Gastrointestinal. Abdomen not distended, no organomegaly, non tender, no rebound or guarding Skin. No rashes Musculoskeletal: no joint deformities     Data Reviewed: I have personally reviewed following labs and imaging studies  CBC:  Recent Labs Lab 07/29/17 1516 07/30/17 0431 07/31/17 0347  08/01/17 2046 08/02/17 0423 08/02/17 1513 08/02/17 2138 08/03/17 0617  WBC 23.6* 22.8* 25.3*  --   --  22.3* 15.7*  --  14.8*  NEUTROABS 21.3* 20.8*  --   --   --  20.7*  --   --   --   HGB 8.5* 7.5* 6.1*  < > 5.3* 6.2*  6.3* 6.9* 7.9* 8.5*  HCT 28.2* 25.1* 20.0*  < > 16.4* 18.5*  19.4* 21.1* 23.9* 26.5*  MCV 88.4 89.3 88.1  --   --  86.9 86.8  --  87.7  PLT 632* 590* 482*  --   --  238 223  --  196  < > = values in this interval not displayed. Basic Metabolic Panel:  Recent Labs Lab 07/28/17 0433 07/29/17 0358 07/30/17 0431 07/30/17 2056 07/31/17 0347 08/02/17 0423 08/03/17 0617  NA 139 139 139 138 138 140 144  K 3.7 4.7 5.5* 4.8 4.8 3.7 3.0*  CL 112* 113* 113* 113* 114* 118* 120*  CO2 21* 20* 21* 19* 19* 16* 17*  GLUCOSE 111* 99 153* 117* 136* 200* 112*  BUN 5* 6 14 20  23* 28* 24*  CREATININE 0.69 1.11* 1.43* 1.68* 1.18* 1.67* 1.56*  CALCIUM 7.4* 7.6* 7.5* 7.4* 7.4* 7.3* 7.3*  MG 1.6*  2.4 2.3  --   --   --   --    GFR: Estimated Creatinine Clearance: 43.8 mL/min (A) (by C-G formula based on SCr of 1.56 mg/dL (H)). Liver Function Tests: No results for input(s): AST, ALT, ALKPHOS, BILITOT, PROT, ALBUMIN in the last 168 hours. No results for input(s): LIPASE, AMYLASE in the last 168 hours. No results for input(s): AMMONIA in the last 168 hours. Coagulation Profile: No results for input(s): INR, PROTIME in the last 168 hours. Cardiac Enzymes: No results for input(s): CKTOTAL, CKMB, CKMBINDEX, TROPONINI in the last 168 hours. BNP (last 3 results) No results for input(s): PROBNP in the last 8760 hours. HbA1C: No results for input(s): HGBA1C in the last 72 hours. CBG:  Recent Labs  Lab 08/02/17 0622 08/02/17 1118 08/02/17 1614 08/02/17 2229 08/03/17 0652  GLUCAP 172* 146* 114* 116* 109*   Lipid Profile: No results for input(s): CHOL, HDL, LDLCALC, TRIG, CHOLHDL, LDLDIRECT in the last 72 hours. Thyroid Function Tests: No results for input(s): TSH, T4TOTAL, FREET4, T3FREE, THYROIDAB in the last 72 hours. Anemia Panel: No results for input(s): VITAMINB12, FOLATE, FERRITIN, TIBC, IRON, RETICCTPCT in the last 72 hours.    Radiology Studies: I have reviewed all of the imaging during this hospital visit personally     Scheduled Meds: . feeding supplement  1 Container Oral TID BM  . folic acid  1 mg Oral Daily  . insulin aspart  0-15 Units Subcutaneous TID WC  . insulin aspart  0-5 Units Subcutaneous QHS  . magnesium chloride  1 tablet Oral Daily  . mirtazapine  7.5 mg Oral QHS  . multivitamin with minerals  1 tablet Oral Daily   Continuous Infusions: . sodium chloride    . sodium chloride    . ampicillin-sulbactam (UNASYN) IV Stopped (08/03/17 0541)  . fluconazole (DIFLUCAN) IV Stopped (08/02/17 1621)  . lacosamide (VIMPAT) IV Stopped (08/02/17 2355)  . levETIRAcetam 1,000 mg (08/03/17 0830)  .  sodium bicarbonate (isotonic) infusion in sterile water  Stopped (08/02/17 1840)     LOS: 41 days        Jaxx Huish Gerome Apley, MD [facility] Pager [phone]

## 2017-08-04 LAB — BASIC METABOLIC PANEL
Anion gap: 7 (ref 5–15)
Anion gap: 9 (ref 5–15)
BUN: 19 mg/dL (ref 6–20)
BUN: 17 mg/dL (ref 6–20)
CHLORIDE: 120 mmol/L — AB (ref 101–111)
CHLORIDE: 118 mmol/L — AB (ref 101–111)
CO2: 18 mmol/L — AB (ref 22–32)
CO2: 18 mmol/L — AB (ref 22–32)
CREATININE: 1.4 mg/dL — AB (ref 0.44–1.00)
Calcium: 7.5 mg/dL — ABNORMAL LOW (ref 8.9–10.3)
Calcium: 7.3 mg/dL — ABNORMAL LOW (ref 8.9–10.3)
Creatinine, Ser: 1.3 mg/dL — ABNORMAL HIGH (ref 0.44–1.00)
GFR calc Af Amer: 48 mL/min — ABNORMAL LOW (ref >60)
GFR calc non Af Amer: 38 mL/min — ABNORMAL LOW (ref >60)
GFR calc non Af Amer: 42 mL/min — ABNORMAL LOW (ref >60)
GFR, EST AFRICAN AMERICAN: 44 mL/min — AB (ref >60)
Glucose, Bld: 91 mg/dL (ref 65–99)
Glucose, Bld: 118 mg/dL — ABNORMAL HIGH (ref 65–99)
POTASSIUM: 2.8 mmol/L — AB (ref 3.5–5.1)
Potassium: 2.7 mmol/L — CL (ref 3.5–5.1)
SODIUM: 145 mmol/L (ref 135–145)
Sodium: 145 mmol/L (ref 135–145)

## 2017-08-04 LAB — TYPE AND SCREEN
ABO/RH(D): O POS
Antibody Screen: NEGATIVE
Unit division: 0
Unit division: 0
Unit division: 0
Unit division: 0
Unit division: 0
Unit division: 0
Unit division: 0
Unit division: 0

## 2017-08-04 LAB — BPAM RBC
Blood Product Expiration Date: 201809052359
Blood Product Expiration Date: 201809242359
Blood Product Expiration Date: 201809272359
Blood Product Expiration Date: 201809272359
Blood Product Expiration Date: 201810012359
Blood Product Expiration Date: 201810012359
Blood Product Expiration Date: 201810012359
Blood Product Expiration Date: 201810012359
ISSUE DATE / TIME: 201808280626
ISSUE DATE / TIME: 201808281012
ISSUE DATE / TIME: 201808291640
ISSUE DATE / TIME: 201808292310
ISSUE DATE / TIME: 201808300946
ISSUE DATE / TIME: 201808301826
ISSUE DATE / TIME: 201808310048
Unit Type and Rh: 5100
Unit Type and Rh: 5100
Unit Type and Rh: 5100
Unit Type and Rh: 5100
Unit Type and Rh: 5100
Unit Type and Rh: 5100
Unit Type and Rh: 5100
Unit Type and Rh: 5100

## 2017-08-04 LAB — CBC WITH DIFFERENTIAL/PLATELET
BASOS PCT: 0 %
Basophils Absolute: 0 10*3/uL (ref 0.0–0.1)
EOS PCT: 1 %
Eosinophils Absolute: 0.1 10*3/uL (ref 0.0–0.7)
HEMATOCRIT: 25.3 % — AB (ref 36.0–46.0)
HEMOGLOBIN: 8.4 g/dL — AB (ref 12.0–15.0)
LYMPHS ABS: 0.9 10*3/uL (ref 0.7–4.0)
Lymphocytes Relative: 7 %
MCH: 28.8 pg (ref 26.0–34.0)
MCHC: 33.2 g/dL (ref 30.0–36.0)
MCV: 86.6 fL (ref 78.0–100.0)
MONO ABS: 1 10*3/uL (ref 0.1–1.0)
MONOS PCT: 8 %
Neutro Abs: 10.8 10*3/uL — ABNORMAL HIGH (ref 1.7–7.7)
Neutrophils Relative %: 84 %
Platelets: 231 10*3/uL (ref 150–400)
RBC: 2.92 MIL/uL — ABNORMAL LOW (ref 3.87–5.11)
RDW: 15.7 % — AB (ref 11.5–15.5)
WBC: 12.8 10*3/uL — ABNORMAL HIGH (ref 4.0–10.5)

## 2017-08-04 LAB — GLUCOSE, CAPILLARY
GLUCOSE-CAPILLARY: 107 mg/dL — AB (ref 65–99)
Glucose-Capillary: 75 mg/dL (ref 65–99)
Glucose-Capillary: 106 mg/dL — ABNORMAL HIGH (ref 65–99)
Glucose-Capillary: 122 mg/dL — ABNORMAL HIGH (ref 65–99)

## 2017-08-04 MED ORDER — LACOSAMIDE 50 MG PO TABS
100.0000 mg | ORAL_TABLET | Freq: Two times a day (BID) | ORAL | Status: DC
  Administered 2017-08-04 – 2017-08-10 (×13): 100 mg via ORAL
  Filled 2017-08-04 (×13): qty 2

## 2017-08-04 MED ORDER — POTASSIUM CHLORIDE 10 MEQ/100ML IV SOLN
10.0000 meq | INTRAVENOUS | Status: AC
  Administered 2017-08-04 (×4): 10 meq via INTRAVENOUS
  Filled 2017-08-04 (×3): qty 100

## 2017-08-04 MED ORDER — LEVETIRACETAM 500 MG PO TABS
1000.0000 mg | ORAL_TABLET | Freq: Two times a day (BID) | ORAL | Status: DC
  Administered 2017-08-04 – 2017-08-10 (×13): 1000 mg via ORAL
  Filled 2017-08-04 (×13): qty 2

## 2017-08-04 MED ORDER — POTASSIUM CHLORIDE CRYS ER 20 MEQ PO TBCR
40.0000 meq | EXTENDED_RELEASE_TABLET | Freq: Once | ORAL | Status: AC
  Administered 2017-08-04: 40 meq via ORAL
  Filled 2017-08-04: qty 2

## 2017-08-04 NOTE — Clinical Social Work Note (Signed)
CSW continuing to follow for medical stability and disposition determination.   [place], [place]

## 2017-08-04 NOTE — Progress Notes (Signed)
CRITICAL VALUE ALERT  Critical Value:  Potassium 2.7  Date & Time Notied:  08-04-2017 @ 05:30  Provider Notified: M. Lynch  Orders Received/Actions taken: None

## 2017-08-04 NOTE — Progress Notes (Signed)
PROGRESS NOTE    Lori [name]  TDV:[id number] DOB: 11-Jun-1951 DOA: 06/23/2017 PCP: [name], MD    Brief Narrative:  66 year old female presented with hematochezia. Patient is known to have type 2 diabetes mellitus, seizures, hypertension, GERD, cervical and appendiceal carcinoma, intracranial aneurysm status post repair 1997. Recent laparotomy June 26, for small bowel obstruction, then she was diagnosed with appendiceal adenocarcinoma, discharge to skilled nursing facility, wound VAC was placed. On initial physical examination she was noted to have frank hematochezia with large blood clots per rectum. Blood pressure 166/101, heart rate 94, respiratory rate 28, oxygen saturation 98%. Moist mucous membranes, lungs had decreased breath sounds at bases, positive rales, heart S1-S2 present rhythmic, the abdomen had tenderness to palpation, no rebound, wound VAC was in place, positive significant lower extremity edema +++ pitting up to the sacrum.   Patient was admitted to the hospital working diagnosis of lower GI bleed, complicated with intra-abdominal fluid collections, to rule out abscesses.   Prolonged hospital stay, patient with continuous GI bleeding and requiring blood transfusions. Very poor prognosis, patient's family had declined palliative care. Not a surgical candidate.    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 Acute recurrent blood loss anemia due to lower GI bleed. No signs of  active bleeding, hb and hct have been stable, will continue to follow on cell count. Patient with no abdominal pain, no nausea or vomiting. Increased appetite. Patient ahd 8 units prbc since admission. Not candidate for surgery or endoscopy, poor prognosis.   2. Resolving sepsis due to bowel ischemia with pneumatosis. SP extensive laparotomy,  conservative medical care, tolerating po diet. Plan to continue on Unsyn #6/10. .   3. Leukocytosis with pyuria. Continue Unasyn, urine culture no specific bacteria isolated  4. AKI with hypokalemia. Renal function with serum cr at 1.40 from 1,56, will continue supportive medical therapy, avoid hypotension or anemia. Patient off IV fluids and tolerating po well. Hypokalemia due to GI losses, will continue repletion with kcl and will follow renal panel in am.   5. Seizures. Patient more awake and reactive will change to oral formulation of anti seizure regimen with Vimapt and keppra  plus as needed lorazepam.   6. Metabolic encephalopathy. Patient reactive and interactive, clinically resolved.   7. 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 Serum Na stable at 145. Will continue to follow renal panel in am, urine output over last 24 hours 1,350.   8. 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 Patient's family has refused palliative care, will continue supportive medical care, if continue to be stable over the next 24 to 48 hours, will start discharge planing, per physical therapy recommendations may benefit from SNF. Continue to be very poor prognosis.  9. Stage II sacral ulcer.  Continue local wound care, per protocol.    DVT prophylaxis: Code Status: Family Communication: Disposition Plan:   Consultants:  Palliative care  Surgery  Procedures:    Antimicrobials:  Unasyn   Subjective:  Patient feeling better, asking for regular diet, no nausea or vomiting, no chest pain. Denies any  further rectal bleeding.   Objective: Vitals:   08/03/17 1522 08/03/17 2041 08/04/17 0011 08/04/17 0400  BP: (!) 163/90 (!) 162/89 (!) 169/90 (!) 148/113  Pulse: (!) 101 (!) 104 (!) 105 (!) 101  Resp: (!) 21 20 20  (!) 21  Temp: 98 F (36.7 C) 98.7 F (37.1 C) 98.7 F (37.1 C) 98 F (36.7 C)  TempSrc: Oral Oral Oral Oral  SpO2: 100% 100% 96% 99%  Weight:    107.2 kg (236 lb 4.8 oz)  Height:        Intake/Output Summary (Last 24 hours) at 08/04/17 0743 Last data filed at 08/04/17 0604  Gross per 24 hour  Intake              242 ml  Output             1354 ml  Net            -1112 ml   Filed Weights   07/30/17 0500 08/01/17 0446 08/04/17 0400  Weight: 98.9 kg (218 lb) 99.8 kg (220 lb) 107.2 kg (236 lb 4.8 oz)    Examination:  General: deconditioned Neurology: Awake and alert, non focal  E ENT: mild pallor, no icterus, oral mucosa moist Cardiovascular: S1-S2 present, rhythmic, no gallops, rubs, or murmurs. No jugular venous distention, trace lower extremity edema. Pulmonary: decreased breath sounds bilaterally at bases, decreases air movement, no wheezing, rhonchi or rales. Gastrointestinal. Abdomen protuberant, no organomegaly, non tender, no rebound or guarding Skin. No rashes Musculoskeletal: no joint deformities     Data Reviewed: I have personally reviewed following labs and imaging studies  CBC:  Recent Labs Lab 07/29/17 1516 07/30/17 0431 07/31/17 0347  08/02/17 0423 08/02/17 1513 08/02/17 2138 08/03/17 0617 08/03/17 0856 08/04/17 0415  WBC 23.6* 22.8* 25.3*  --  22.3* 15.7*  --  14.8*  --  12.8*  NEUTROABS 21.3* 20.8*  --   --  20.7*  --   --   --   --  10.8*  HGB 8.5* 7.5* 6.1*  < > 6.2*  6.3* 6.9* 7.9* 8.5* 8.6* 8.4*  HCT 28.2* 25.1* 20.0*  < > 18.5*  19.4* 21.1* 23.9* 26.5* 26.4* 25.3*  MCV 88.4 89.3 88.1  --  86.9 86.8  --  87.7  --  86.6  PLT 632* 590* 482*  --  238 223  --  196  --  231  < > = values in this interval not displayed. Basic  Metabolic Panel:  Recent Labs Lab 07/29/17 0358 07/30/17 0431 07/30/17 2056 07/31/17 0347 08/02/17 0423 08/03/17 0617 08/04/17 0415  NA 139 139 138 138 140 144 145  K 4.7 5.5* 4.8 4.8 3.7 3.0* 2.7*  CL 113* 113* 113* 114* 118* 120* 120*  CO2 20* 21* 19* 19* 16* 17* 18*  GLUCOSE 99 153* 117* 136* 200* 112* 91  BUN 6 14 20  23* 28* 24* 19  CREATININE 1.11* 1.43* 1.68* 1.18* 1.67* 1.56* 1.40*  CALCIUM 7.6* 7.5* 7.4* 7.4* 7.3* 7.3* 7.5*  MG 2.4 2.3  --   --   --   --   --    GFR: Estimated Creatinine Clearance: 50.7 mL/min (A) (by C-G formula based on SCr of 1.4 mg/dL (H)). Liver Function Tests: No results for input(s): AST, ALT, ALKPHOS, BILITOT, PROT, ALBUMIN in the last 168 hours. No results for input(s): LIPASE, AMYLASE in the last 168  hours. No results for input(s): AMMONIA in the last 168 hours. Coagulation Profile: No results for input(s): INR, PROTIME in the last 168 hours. Cardiac Enzymes: No results for input(s): CKTOTAL, CKMB, CKMBINDEX, TROPONINI in the last 168 hours. BNP (last 3 results) No results for input(s): PROBNP in the last 8760 hours. HbA1C: No results for input(s): HGBA1C in the last 72 hours. CBG:  Recent Labs Lab 08/03/17 0652 08/03/17 1117 08/03/17 1607 08/03/17 2057 08/04/17 0618  GLUCAP 109* 110* 103* 84 75   Lipid Profile: No results for input(s): CHOL, HDL, LDLCALC, TRIG, CHOLHDL, LDLDIRECT in the last 72 hours. Thyroid Function Tests: No results for input(s): TSH, T4TOTAL, FREET4, T3FREE, THYROIDAB in the last 72 hours. Anemia Panel: No results for input(s): VITAMINB12, FOLATE, FERRITIN, TIBC, IRON, RETICCTPCT in the last 72 hours.    Radiology Studies: I have reviewed all of the imaging during this hospital visit personally     Scheduled Meds: . feeding supplement  1 Container Oral TID BM  . folic acid  1 mg Oral Daily  . insulin aspart  0-15 Units Subcutaneous TID WC  . insulin aspart  0-5 Units Subcutaneous QHS  .  magnesium chloride  1 tablet Oral Daily  . mirtazapine  7.5 mg Oral QHS  . multivitamin with minerals  1 tablet Oral Daily  . sodium citrate-citric acid  30 mL Oral TID PC & HS   Continuous Infusions: . sodium chloride    . ampicillin-sulbactam (UNASYN) IV Stopped (08/04/17 0612)  . fluconazole (DIFLUCAN) IV Stopped (08/03/17 1341)  . lacosamide (VIMPAT) IV Stopped (08/03/17 2258)  . levETIRAcetam Stopped (08/03/17 2003)  . potassium chloride Stopped (08/04/17 0659)     LOS: 42 days        Mauricio Gerome Apley, MD [facility] Pager [phone]

## 2017-08-05 LAB — BASIC METABOLIC PANEL
ANION GAP: 7 (ref 5–15)
ANION GAP: 8 (ref 5–15)
BUN: 15 mg/dL (ref 6–20)
BUN: 11 mg/dL (ref 6–20)
CHLORIDE: 122 mmol/L — AB (ref 101–111)
CHLORIDE: 120 mmol/L — AB (ref 101–111)
CO2: 21 mmol/L — AB (ref 22–32)
CO2: 22 mmol/L (ref 22–32)
Calcium: 7.5 mg/dL — ABNORMAL LOW (ref 8.9–10.3)
Calcium: 7.5 mg/dL — ABNORMAL LOW (ref 8.9–10.3)
Creatinine, Ser: 1.18 mg/dL — ABNORMAL HIGH (ref 0.44–1.00)
Creatinine, Ser: 1.01 mg/dL — ABNORMAL HIGH (ref 0.44–1.00)
GFR calc Af Amer: 54 mL/min — ABNORMAL LOW (ref >60)
GFR, EST NON AFRICAN AMERICAN: 47 mL/min — AB (ref >60)
GFR, EST NON AFRICAN AMERICAN: 57 mL/min — AB (ref >60)
GLUCOSE: 115 mg/dL — AB (ref 65–99)
GLUCOSE: 91 mg/dL (ref 65–99)
POTASSIUM: 2.5 mmol/L — AB (ref 3.5–5.1)
Potassium: 3 mmol/L — ABNORMAL LOW (ref 3.5–5.1)
Sodium: 150 mmol/L — ABNORMAL HIGH (ref 135–145)
Sodium: 150 mmol/L — ABNORMAL HIGH (ref 135–145)

## 2017-08-05 LAB — CBC WITH DIFFERENTIAL/PLATELET
Basophils Absolute: 0 10*3/uL (ref 0.0–0.1)
Basophils Relative: 0 %
EOS PCT: 1 %
Eosinophils Absolute: 0.1 10*3/uL (ref 0.0–0.7)
HEMATOCRIT: 26 % — AB (ref 36.0–46.0)
HEMOGLOBIN: 8.6 g/dL — AB (ref 12.0–15.0)
LYMPHS PCT: 8 %
Lymphs Abs: 0.9 10*3/uL (ref 0.7–4.0)
MCH: 28.7 pg (ref 26.0–34.0)
MCHC: 33.1 g/dL (ref 30.0–36.0)
MCV: 86.7 fL (ref 78.0–100.0)
MONOS PCT: 8 %
Monocytes Absolute: 0.9 10*3/uL (ref 0.1–1.0)
NEUTROS ABS: 9.4 10*3/uL — AB (ref 1.7–7.7)
Neutrophils Relative %: 83 %
Platelets: 261 10*3/uL (ref 150–400)
RBC: 3 MIL/uL — ABNORMAL LOW (ref 3.87–5.11)
RDW: 16 % — ABNORMAL HIGH (ref 11.5–15.5)
WBC: 11.3 10*3/uL — ABNORMAL HIGH (ref 4.0–10.5)

## 2017-08-05 LAB — OSMOLALITY: OSMOLALITY: 305 mosm/kg — AB (ref 275–295)

## 2017-08-05 LAB — GLUCOSE, CAPILLARY
Glucose-Capillary: 99 mg/dL (ref 65–99)
Glucose-Capillary: 87 mg/dL (ref 65–99)
Glucose-Capillary: 88 mg/dL (ref 65–99)
Glucose-Capillary: 77 mg/dL (ref 65–99)

## 2017-08-05 LAB — OSMOLALITY, URINE: OSMOLALITY UR: 300 mosm/kg (ref 300–900)

## 2017-08-05 MED ORDER — POTASSIUM CHLORIDE 10 MEQ/100ML IV SOLN
INTRAVENOUS | Status: AC
  Administered 2017-08-05: 10 meq
  Filled 2017-08-05: qty 100

## 2017-08-05 MED ORDER — COLLAGENASE 250 UNIT/GM EX OINT
TOPICAL_OINTMENT | Freq: Every day | CUTANEOUS | Status: DC
  Administered 2017-08-05 – 2017-08-09 (×4): via TOPICAL
  Filled 2017-08-05 (×3): qty 30

## 2017-08-05 MED ORDER — DESMOPRESSIN ACETATE SPRAY 0.01 % NA SOLN
10.0000 ug | Freq: Every day | NASAL | Status: DC
  Administered 2017-08-07: 10 ug via NASAL
  Filled 2017-08-05 (×2): qty 5

## 2017-08-05 MED ORDER — MAGNESIUM SULFATE IN D5W 1-5 GM/100ML-% IV SOLN
1.0000 g | Freq: Once | INTRAVENOUS | Status: AC
  Administered 2017-08-05: 1 g via INTRAVENOUS
  Filled 2017-08-05: qty 100

## 2017-08-05 MED ORDER — POTASSIUM CHLORIDE 10 MEQ/100ML IV SOLN
10.0000 meq | INTRAVENOUS | Status: AC
  Administered 2017-08-05 (×2): 10 meq via INTRAVENOUS
  Filled 2017-08-05 (×3): qty 100

## 2017-08-05 MED ORDER — AMOXICILLIN-POT CLAVULANATE 875-125 MG PO TABS
1.0000 | ORAL_TABLET | Freq: Two times a day (BID) | ORAL | Status: DC
  Administered 2017-08-05 – 2017-08-08 (×6): 1 via ORAL
  Filled 2017-08-05 (×7): qty 1

## 2017-08-05 MED ORDER — POTASSIUM CHLORIDE CRYS ER 20 MEQ PO TBCR
40.0000 meq | EXTENDED_RELEASE_TABLET | Freq: Once | ORAL | Status: AC
  Administered 2017-08-05: 40 meq via ORAL
  Filled 2017-08-05: qty 2

## 2017-08-05 NOTE — Progress Notes (Signed)
CRITICAL VALUE ALERT  Critical Value:  K+ 2,5  Date & Time Notied:  08/05/17 5:50am   Provider Notified: [place]  Orders Received/Actions taken: orders for 3 runs of K+ and 1 bag of magnesium

## 2017-08-05 NOTE — Progress Notes (Signed)
PROGRESS NOTE    Antoine Fiallos  JGG:[id number] DOB: 07-24-1951 DOA: 06/23/2017 PCP: [name], MD    Brief Narrative:  66 year old female presented with hematochezia. Patient is known to have type 2 diabetes mellitus, seizures, hypertension, GERD, cervical and appendiceal carcinoma, intracranial aneurysm status post repair 1997. Recent laparotomy June 26, for small bowel obstruction, then she was diagnosed with appendiceal adenocarcinoma, discharge to skilled nursing facility, wound VAC was placed. On initial physical examination she was noted to have frank hematochezia with large blood clots per rectum. Blood pressure 166/101, heart rate 94, respiratory rate 28, oxygen saturation 98%. Moist mucous membranes, lungs had decreased breath sounds at bases, positive rales, heart S1-S2 present rhythmic, the abdomen had tenderness to palpation, no rebound, wound VAC was in place, positive significant lower extremity edema +++ pitting up to the sacrum.   Patient was admitted to the hospital working diagnosis of lower GI bleed, complicated with intra-abdominal fluid collections, to rule out abscesses.   Prolonged hospital stay, patient with continuous intermittent GI bleeding and requiring blood transfusions (total 8 units). Very poor prognosis, patient's family had declined palliative care. Not a surgical candidate.    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 Acute recurrent blood loss anemia due to  lower GI bleed. Continue hb and hct have been stable, hb at 8,6 and hct at 26. Total 8 units prbc since admission. Conservative medical care.   2. Resolving sepsis due to bowel ischemia with pneumatosis, . SPextensive laparotomy, drainage of abdominal abscess, repair of small bowel perforation, repair of small bowel enterotomy, enterocolostomy (mid ileum to mid transverse colon). Will continue withconservative medical care, tolerating po diet. Change Unsyn #6/10 to Augmentin to complete 10 days course.    3. Leukocytosis with pyuria. Wbc down to 11,3, patient has remained afebrile, will continue to follow on cell count.   4. 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 Serum cr down to 1,18, urine output has increased to 4,800 ml. Persistent hypokalemia, probably related to polyuria and increase Na Bicarb absorption on the proximal tubule. Patient clinically not hypovolemic, will resume desmopressin at 10 mcg nasal daily, will check urine osmolality, and serum osmolality, with plan to check urine osmolality on vasopressin replacement, and confirm diagnosis of central diabetes insipidus. Potassium repletion with kcl and mag repletion with mag chloride, follow bmp this pm.   5. Seizures. Tolerating well po anti-seizure medications, patient with no agitation or frank confusion, continue lacosimide and keppra.   6. 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 Will continue supportive medical care, social services consulted for discharge planning. Very poor prognosis.     7. Stage II sacral ulcer. Will continue local wound care.   8. Depression. Continue mirtazapine.  DVT prophylaxis: Code Status: Family Communication: Disposition Plan:   Consultants:  Palliative care  Surgery  Procedures:    Antimicrobials:  Unasyn change to Augmentin  Subjective: Patient with no acute complains, no nausea or vomiting and tolerating po well.  Patient  with no evidence of recurrent bleeding.   Objective: Vitals:   08/04/17 2155 08/05/17 0000 08/05/17 0400 08/05/17 0828  BP: (!) 154/101 129/80 128/79 (!) 143/90  Pulse:   (!) 108 (!) 105  Resp:  (!) 22 18 16   Temp:  98.1 F (36.7 C) (!) 97.5 F (36.4 C) 97.9 F (36.6 C)  TempSrc:  Oral Oral Oral  SpO2:  100% 97% 94%  Weight:      Height:        Intake/Output Summary (Last 24 hours) at 08/05/17 0955 Last data filed at 08/05/17 6967  Gross per 24 hour  Intake             1000 ml  Output             4800 ml  Net            -3800 ml   Filed Weights   07/30/17 0500 08/01/17 0446 08/04/17 0400  Weight: 98.9 kg (218 lb) 99.8 kg (220 lb) 107.2 kg (236 lb 4.8 oz)    Examination:  General: deconditioned Neurology: Awake and alert, non focal  E ENT: mild pallor, no icterus, oral mucosa moist Cardiovascular: S1-S2 present, rhythmic, no gallops, rubs, or murmurs. No jugular venous distention, trace lower extremity edema. Pulmonary: vesicular breath sounds bilaterally, adequate air movement, no wheezing, rhonchi or rales. Gastrointestinal. Abdomen protuberant, no organomegaly, non tender, no rebound or guarding Skin. No rashes Musculoskeletal: no joint deformities     Data Reviewed: I have personally reviewed following labs and imaging studies  CBC:  Recent Labs Lab 07/29/17 1516 07/30/17 0431  08/02/17 0423 08/02/17 1513 08/02/17 2138 08/03/17 0617 08/03/17 0856 08/04/17 0415 08/05/17 0414  WBC 23.6* 22.8*  < > 22.3* 15.7*  --  14.8*  --  12.8* 11.3*  NEUTROABS 21.3* 20.8*  --  20.7*  --   --   --   --  10.8* 9.4*  HGB 8.5* 7.5*  < > 6.2*  6.3* 6.9* 7.9* 8.5* 8.6* 8.4* 8.6*  HCT 28.2* 25.1*  < > 18.5*  19.4* 21.1* 23.9* 26.5* 26.4* 25.3* 26.0*  MCV 88.4 89.3  < > 86.9 86.8  --  87.7  --  86.6 86.7  PLT 632* 590*  < > 238 223  --  196  --  231 261  < > = values in this interval not displayed. Basic Metabolic Panel:  Recent Labs Lab 07/30/17 0431   08/02/17 0423 08/03/17 0617 08/04/17 0415 08/04/17 1848 08/05/17 0414  NA 139  < > 140 144 145 145 150*  K 5.5*  < > 3.7 3.0* 2.7* 2.8* 2.5*  CL 113*  < > 118* 120* 120* 118* 122*  CO2 21*  < > 16* 17* 18* 18* 21*  GLUCOSE 153*  < > 200* 112* 91 118* 115*  BUN 14  < > 28* 24* 19 17 15   CREATININE 1.43*  < > 1.67* 1.56* 1.40* 1.30* 1.18*  CALCIUM 7.5*  < > 7.3* 7.3* 7.5* 7.3* 7.5*  MG 2.3  --   --   --   --   --   --   < > = values in this interval not displayed. GFR: Estimated Creatinine Clearance: 60.1 mL/min (A) (by C-G formula based on SCr of 1.18 mg/dL (H)). Liver Function Tests: No results for input(s): AST, ALT, ALKPHOS, BILITOT, PROT, ALBUMIN in the last 168 hours. No results for input(s): LIPASE, AMYLASE in the last 168 hours. No results for  input(s): AMMONIA in the last 168 hours. Coagulation Profile: No results for input(s): INR, PROTIME in the last 168 hours. Cardiac Enzymes: No results for input(s): CKTOTAL, CKMB, CKMBINDEX, TROPONINI in the last 168 hours. BNP (last 3 results) No results for input(s): PROBNP in the last 8760 hours. HbA1C: No results for input(s): HGBA1C in the last 72 hours. CBG:  Recent Labs Lab 08/04/17 0618 08/04/17 1111 08/04/17 1650 08/04/17 2021 08/05/17 0616  GLUCAP 75 107* 106* 122* 99   Lipid Profile: No results for input(s): CHOL, HDL, LDLCALC, TRIG, CHOLHDL, LDLDIRECT in the last 72 hours. Thyroid Function Tests: No results for input(s): TSH, T4TOTAL, FREET4, T3FREE, THYROIDAB in the last 72 hours. Anemia Panel: No results for input(s): VITAMINB12, FOLATE, FERRITIN, TIBC, IRON, RETICCTPCT in the last 72 hours.    Radiology Studies: I have reviewed all of the imaging during this hospital visit personally     Scheduled Meds: . feeding supplement  1 Container Oral TID BM  . folic acid  1 mg Oral Daily  . insulin aspart  0-15 Units Subcutaneous TID WC  . insulin aspart  0-5 Units Subcutaneous QHS  . lacosamide  100 mg  Oral BID  . levETIRAcetam  1,000 mg Oral BID  . magnesium chloride  1 tablet Oral Daily  . mirtazapine  7.5 mg Oral QHS  . multivitamin with minerals  1 tablet Oral Daily  . sodium citrate-citric acid  30 mL Oral TID PC & HS   Continuous Infusions: . sodium chloride    . ampicillin-sulbactam (UNASYN) IV Stopped (08/05/17 0547)  . magnesium sulfate 1 - 4 g bolus IVPB    . potassium chloride 10 mEq (08/05/17 0858)     LOS: 43 days      Maija Biggers Gerome Apley, MD [facility] Pager [phone]

## 2017-08-05 NOTE — Consult Note (Signed)
[facility] Nurse wound consult note Reason for Consult: Patient well known to our department, last seen by this writer in July 2018. Noted that [facility] has signed off of abdominal wound oversight. Conservative care orders for twice daily saline dressings to that wound provided for nursing staff via the orders. The sacral wound is Unstageable and presents with thick black eschar. See treatment recommendations below for that wound. Wound type: Surgical, pressure Pressure Injury POA: Yes Measurement: Left Buttock: 6.5cm x 5cm with thick black eschar in center measuring 5.5cm x 4cm. Rim of yellow tissue surrounds eschar. Small amount of light yellow exudate noted on old dressing. Small oval full thickness wound located at 5 o'clock measuring 2cm x 1cm x 0.2cm. Moist red, wound bed. Abdominal: 13cm x 5cm x 2cm with 75% clean, light pink wound bed and 25% tan slough from 8-10 o'clock. Blue sutures visible, but wound stable. See [facility] PA [place]'s note dated 08/01/17). Small amount of light yellow exudate noted on old dressing. Perineal maceration from fecal incontinence.  Wound bed: As described above Drainage (amount, consistency, odor) As described above Periwound:Intact, dry Dressing procedure/placement/frequency: I will provide patient with a mattress replacement with low air loss feature today as well as bilateral pressure redistribution heel boots. I have provided the Nursing staff with guidance via the Orders for turning and repositioning and for number of layers of bed linen beneath the patient. I recommend hydrotherapy (performed by PT) for cross-hatching of the left buttock eschar and both mechanical and conservative sharp wound debridement used in conjunction with enzymatic debriding agent (Santyl/collagenase) that will begin today. Guidance and clarification for the abdominal wound care has been provided. [facility] nursing team will not follow, but will remain available to this patient, the nursing and medical teams.   Please re-consult if needed. Thanks, Maudie Flakes, MSN, RN, [place], [facility]  Pager# [phone]

## 2017-08-05 NOTE — Progress Notes (Signed)
Blood drawn from porta cath for osmolarity and BMP. Iv site unremarkable, flushed.

## 2017-08-06 LAB — BASIC METABOLIC PANEL
ANION GAP: 8 (ref 5–15)
BUN: 9 mg/dL (ref 6–20)
CALCIUM: 7.3 mg/dL — AB (ref 8.9–10.3)
CO2: 23 mmol/L (ref 22–32)
CREATININE: 0.97 mg/dL (ref 0.44–1.00)
Chloride: 119 mmol/L — ABNORMAL HIGH (ref 101–111)
GFR calc Af Amer: >60 mL/min (ref >60)
GFR, EST NON AFRICAN AMERICAN: 60 mL/min — AB (ref >60)
GLUCOSE: 80 mg/dL (ref 65–99)
Potassium: 2.6 mmol/L — CL (ref 3.5–5.1)
Sodium: 150 mmol/L — ABNORMAL HIGH (ref 135–145)

## 2017-08-06 LAB — GLUCOSE, CAPILLARY
GLUCOSE-CAPILLARY: 76 mg/dL (ref 65–99)
GLUCOSE-CAPILLARY: 88 mg/dL (ref 65–99)
Glucose-Capillary: 77 mg/dL (ref 65–99)
Glucose-Capillary: 99 mg/dL (ref 65–99)

## 2017-08-06 LAB — OSMOLALITY, URINE: OSMOLALITY UR: 353 mosm/kg (ref 300–900)

## 2017-08-06 LAB — MAGNESIUM: Magnesium: 1.3 mg/dL — ABNORMAL LOW (ref 1.7–2.4)

## 2017-08-06 MED ORDER — POTASSIUM CHLORIDE 20 MEQ/15ML (10%) PO SOLN
40.0000 meq | ORAL | Status: AC
  Administered 2017-08-06 (×3): 40 meq via ORAL
  Filled 2017-08-06 (×3): qty 30

## 2017-08-06 MED ORDER — MAGNESIUM SULFATE 2 GM/50ML IV SOLN
2.0000 g | Freq: Once | INTRAVENOUS | Status: AC
  Administered 2017-08-06: 2 g via INTRAVENOUS
  Filled 2017-08-06: qty 50

## 2017-08-06 NOTE — Progress Notes (Signed)
Physical Therapy Wound Treatment Patient Details  Name: Evita Merida MRN: [id number] Date of Birth: 05-Oct-1951  Today's Date: 08/06/2017 Time: 1000-1045 Time Calculation (min): 45 min  Subjective  Subjective: Pt pleasant and agreeable to therapy Patient and Family Stated Goals: Heal wound  Pain Score:  Pt reports discomfort in her LUE from laying on it during the treatment, however no pain at the actual wound site was reported.   Wound Assessment  Pressure Injury 07/14/17 Stage II -  Partial thickness loss of dermis presenting as a shallow open ulcer with a red, pink wound bed without slough. (Active)  Dressing Type Barrier Film (skin prep);Gauze (Comment);Moist to dry 08/06/2017  9:36 AM  Dressing Changed 08/06/2017  9:36 AM  Dressing Change Frequency Other (Comment) 08/06/2017  9:36 AM  State of Healing Eschar 08/06/2017  9:36 AM  Site / Wound Assessment Black;Purple 08/06/2017  9:36 AM  % Wound base Red or Granulating 0% 08/06/2017  9:36 AM  % Wound base Yellow/Fibrinous Exudate 0% 08/06/2017  9:36 AM  % Wound base Black/Eschar 100% 08/06/2017  9:36 AM  % Wound base Other/Granulation Tissue (Comment) 0% 08/06/2017  9:36 AM  Peri-wound Assessment Intact;Purple;Black 08/06/2017  9:36 AM  Wound Length (cm) 7.6 cm 08/06/2017  9:36 AM  Wound Width (cm) 5.6 cm 08/06/2017  9:36 AM  Wound Depth (cm) 0.7 cm 08/06/2017  9:36 AM  Margins Unattached edges (unapproximated) 08/06/2017  9:36 AM  Drainage Amount Minimal 08/06/2017  9:36 AM  Drainage Description Serosanguineous 08/06/2017  9:36 AM  Treatment Debridement (Selective);Hydrotherapy (Pulse lavage);Packing (Saline gauze) 08/06/2017  9:36 AM   Santyl applied to wound bed prior to applying dressing.     Hydrotherapy Pulsed lavage therapy - wound location: L buttock Pulsed Lavage with Suction (psi): 12 psi Pulsed Lavage with Suction - Normal Saline Used: 1000 mL Pulsed Lavage Tip: Tip with splash shield Selective Debridement Selective Debridement - Location: L  buttock Selective Debridement - Tools Used: Forceps;Scalpel Selective Debridement - Tissue Removed: Black eschar   Wound Assessment and Plan  Wound Therapy - Assess/Plan/Recommendations Wound Therapy - Clinical Statement: Pt presents to hydrotherapy with unstagable pressure injury. Pt will benefit from continued hydrotherapy for selective removal of nonviable tissue and to promote wound bed healing.  Wound Therapy - Functional Problem List: Decreased mobility Factors Delaying/Impairing Wound Healing: Altered sensation;Diabetes Mellitus;Incontinence;Immobility;Multiple medical problems Hydrotherapy Plan: Debridement;Dressing change;Patient/family education;Pulsatile lavage with suction Wound Therapy - Frequency: 6X / week Wound Therapy - Follow Up Recommendations: Skilled nursing facility Wound Plan: See above  Wound Therapy Goals- Improve the function of patient's integumentary system by progressing the wound(s) through the phases of wound healing (inflammation - proliferation - remodeling) by: Decrease Necrotic Tissue to: 25% Decrease Necrotic Tissue - Progress: Goal set today Increase Granulation Tissue to: 75% Increase Granulation Tissue - Progress: Goal set today Goals/treatment plan/discharge plan were made with and agreed upon by patient/family: Yes Time For Goal Achievement: 7 days Wound Therapy - Potential for Goals: Good  Goals will be updated until maximal potential achieved or discharge criteria met.  Discharge criteria: when goals achieved, discharge from hospital, MD decision/surgical intervention, no progress towards goals, refusal/missing three consecutive treatments without notification or medical reason.  GP     Thelma Comp 08/06/2017, 10:57 AM  Rolinda Roan, PT, DPT Acute Rehabilitation Services Pager: [phone]

## 2017-08-06 NOTE — Progress Notes (Signed)
PROGRESS NOTE    Lori [name]  STM:[id number] DOB: 10-11-1951 DOA: 06/23/2017 PCP: [name], MD    Brief Narrative:  66 year old female presented with hematochezia. Patient is known to have type 2 diabetes mellitus, seizures, hypertension, GERD, cervical and appendiceal carcinoma, intracranial aneurysm status post repair 1997. Recent laparotomy June 26, for small bowel obstruction, then she was diagnosed with appendiceal adenocarcinoma, discharge to skilled nursing facility, wound VAC was placed. On initial physical examination she was noted to have frank hematochezia with large blood clots per rectum. Blood pressure 166/101, heart rate 94, respiratory rate 28, oxygen saturation 98%. Moist mucous membranes, lungs had decreased breath sounds at bases, positive rales, heart S1-S2 present rhythmic, the abdomen had tenderness to palpation, no rebound, wound VAC was in place, positive significant lower extremity edema +++ pitting up to the sacrum.   Patient was admitted to the hospital working diagnosis of lower GI bleed, complicated with intra-abdominal fluid collections, to rule out abscesses.   Prolonged hospital stay, patient with continuous intermittent GI bleeding and requiring blood transfusions (total 8 units). Very poor prognosis, patient's family had declined palliative care. Not a surgical candidate. Desmopressin has been resumed.    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 Acute  recurrent blood loss anemia due to lower GI bleed. Total 8 units prbc since admission. Conservative medical care.   2. Resolving sepsis due to bowel ischemia with pneumatosis. SPextensive laparotomy, drainage of abdominal abscess, repair of small bowel perforation, repair of small bowel enterotomy, enterocolostomy (mid ileum to mid transverse colon). Patient tolerating po diet.  Unsyn #6/10 now on  Augmentin to complete #1/4.    3. Leukocytosis with pyuria. Urine culture negative (multiple bacteria), suspected contamination.   4. 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 Serum cr down to 0,97, no documentation of urine output, will continue strict and out monitoring, urine osmolality before desmopressin 300, will repeat osmolality to confirm diagnosis of central diabetes insipidus. Aggressive K repletion with oral kcl. Expected to improve with antidiuretic hormone repletion.   5. Seizures. Tolerating well po anti-seizure medications, with lacosamide and keppra. Continue neuro checks per unit protocol.   6. 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 Continue very poor prognosis, no palliative care per patient's family request, will plant to transfer to snf when stable electrolytes. Not candidate for systemic chemotherapy, due very low performing status.     7. Stage II sacral ulcer. Will continue local wound care, per unit protocol.   8. Depression. Continue mirtazapine. No agitation or confusion.   DVT prophylaxis: Code Status: Family Communication: Disposition Plan:   Consultants:  Palliative care  Surgery  Procedures:    Antimicrobials:  Unasyn change to Augmentin   Subjective: Patient feeling better, no nausea or vomiting, no further bleeding, persistent diarrhea, no chest pain or dyspnea. Eager to go home.   Objective: Vitals:   08/05/17 0400 08/05/17 0828 08/06/17 0000 08/06/17 0400  BP: 128/79 (!) 143/90 (!) 148/92  Pulse: (!) 108 (!) 105 100   Resp: 18 16 (!) 24   Temp: (!) 97.5 F (36.4 C) 97.9 F (36.6 C) 98.6 F (37 C) 98.6 F (37 C)  TempSrc: Oral Oral Oral Oral  SpO2: 97% 94% 99%   Weight:      Height:        Intake/Output Summary (Last 24 hours) at 08/06/17 0756 Last data filed at 08/06/17 0700  Gross per 24 hour  Intake             3520 ml  Output                0 ml  Net             3520 ml   Filed Weights   07/30/17 0500 08/01/17 0446 08/04/17 0400  Weight: 98.9 kg (218 lb) 99.8 kg (220 lb) 107.2 kg (236 lb 4.8 oz)    Examination:  General: deconditioned Neurology: Awake and alert, non focal  E ENT: mild pallor, no icterus, oral mucosa moist Cardiovascular: S1-S2 present, rhythmic, no gallops, rubs, or murmurs. No jugular venous distention, trace non pitting lower extremity edema. Pulmonary: vesicular breath sounds bilaterally, adequate air movement, no wheezing, rhonchi or rales. Gastrointestinal. Abdomen with clean surgical wound, dressing in place, no organomegaly, non tender, no rebound or guarding Skin. No rashes Musculoskeletal: no joint deformities     Data Reviewed: I have personally reviewed following labs and imaging studies  CBC:  Recent Labs Lab 08/02/17 0423 08/02/17 1513 08/02/17 2138 08/03/17 0617 08/03/17 0856 08/04/17 0415 08/05/17 0414  WBC 22.3* 15.7*  --  14.8*  --  12.8* 11.3*  NEUTROABS 20.7*  --   --   --   --  10.8* 9.4*  HGB 6.2*  6.3* 6.9* 7.9* 8.5* 8.6* 8.4* 8.6*  HCT 18.5*  19.4* 21.1* 23.9* 26.5* 26.4* 25.3* 26.0*  MCV 86.9 86.8  --  87.7  --  86.6 86.7  PLT 238 223  --  196  --  231 353   Basic Metabolic Panel:  Recent Labs Lab 08/04/17 0415 08/04/17 1848 08/05/17 0414 08/05/17 1736 08/06/17 0516  NA 145 145 150* 150* 150*  K 2.7* 2.8* 2.5* 3.0* 2.6*  CL 120* 118* 122* 120* 119*  CO2 18* 18* 21* 22 23  GLUCOSE 91 118* 115* 91 80  BUN 19 17 15 11 9   CREATININE 1.40* 1.30* 1.18* 1.01* 0.97  CALCIUM 7.5* 7.3*  7.5* 7.5* 7.3*  MG  --   --   --   --  1.3*   GFR: Estimated Creatinine Clearance: 73.1 mL/min (by C-G formula based on SCr of 0.97 mg/dL). Liver Function Tests: No results for input(s): AST, ALT, ALKPHOS, BILITOT, PROT, ALBUMIN in the last 168 hours. No results for input(s): LIPASE, AMYLASE in the last 168 hours. No results for input(s): AMMONIA in the last 168 hours. Coagulation Profile: No results for input(s): INR, PROTIME in the last 168 hours. Cardiac Enzymes: No results for input(s): CKTOTAL, CKMB, CKMBINDEX, TROPONINI in the last 168 hours. BNP (last 3 results) No results for input(s): PROBNP in the last 8760 hours. HbA1C: No results for input(s): HGBA1C in the last 72 hours. CBG:  Recent Labs Lab 08/05/17 0616 08/05/17 1120 08/05/17 1635 08/05/17 2120 08/06/17 0630  GLUCAP 99 87 88 77 77   Lipid Profile: No results for input(s): CHOL, HDL, LDLCALC, TRIG, CHOLHDL, LDLDIRECT in the last 72 hours. Thyroid Function Tests: No results for input(s): TSH, T4TOTAL,  FREET4, T3FREE, THYROIDAB in the last 72 hours. Anemia Panel: No results for input(s): VITAMINB12, FOLATE, FERRITIN, TIBC, IRON, RETICCTPCT in the last 72 hours.    Radiology Studies: I have reviewed all of the imaging during this hospital visit personally     Scheduled Meds: . amoxicillin-clavulanate  1 tablet Oral Q12H  . collagenase   Topical Daily  . desmopressin  10 mcg Nasal Daily  . feeding supplement  1 Container Oral TID BM  . folic acid  1 mg Oral Daily  . insulin aspart  0-15 Units Subcutaneous TID WC  . insulin aspart  0-5 Units Subcutaneous QHS  . lacosamide  100 mg Oral BID  . levETIRAcetam  1,000 mg Oral BID  . magnesium chloride  1 tablet Oral Daily  . mirtazapine  7.5 mg Oral QHS  . multivitamin with minerals  1 tablet Oral Daily  . sodium citrate-citric acid  30 mL Oral TID PC & HS   Continuous Infusions:   LOS: 44 days        Mauricio Gerome Apley, MD [facility] Pager [phone]

## 2017-08-06 NOTE — Progress Notes (Signed)
CRITICAL VALUE ALERT  Critical Value:  K+ 2.6  Date & Time Notied:  08/06/2017 0700am  Provider Notified: Arrien  Orders Received/Actions taken: paged MD on call will wait for orders and pass over information to oncoming day shift nurse

## 2017-08-07 LAB — BASIC METABOLIC PANEL
Anion gap: 6 (ref 5–15)
BUN: 5 mg/dL — ABNORMAL LOW (ref 6–20)
CALCIUM: 6.9 mg/dL — AB (ref 8.9–10.3)
CO2: 23 mmol/L (ref 22–32)
CREATININE: 0.8 mg/dL (ref 0.44–1.00)
Chloride: 117 mmol/L — ABNORMAL HIGH (ref 101–111)
GLUCOSE: 142 mg/dL — AB (ref 65–99)
Potassium: 2.6 mmol/L — CL (ref 3.5–5.1)
Sodium: 146 mmol/L — ABNORMAL HIGH (ref 135–145)

## 2017-08-07 LAB — GLUCOSE, CAPILLARY
GLUCOSE-CAPILLARY: 141 mg/dL — AB (ref 65–99)
GLUCOSE-CAPILLARY: 120 mg/dL — AB (ref 65–99)
Glucose-Capillary: 145 mg/dL — ABNORMAL HIGH (ref 65–99)
Glucose-Capillary: 124 mg/dL — ABNORMAL HIGH (ref 65–99)

## 2017-08-07 LAB — MAGNESIUM: Magnesium: 1.5 mg/dL — ABNORMAL LOW (ref 1.7–2.4)

## 2017-08-07 MED ORDER — POTASSIUM CHLORIDE 10 MEQ/100ML IV SOLN
10.0000 meq | INTRAVENOUS | Status: AC
  Administered 2017-08-07 (×5): 10 meq via INTRAVENOUS
  Filled 2017-08-07 (×5): qty 100

## 2017-08-07 MED ORDER — POTASSIUM CHLORIDE CRYS ER 20 MEQ PO TBCR
40.0000 meq | EXTENDED_RELEASE_TABLET | ORAL | Status: AC
  Administered 2017-08-07 (×2): 40 meq via ORAL
  Filled 2017-08-07 (×2): qty 2

## 2017-08-07 MED ORDER — MAGNESIUM SULFATE 2 GM/50ML IV SOLN
2.0000 g | Freq: Once | INTRAVENOUS | Status: AC
  Administered 2017-08-07: 2 g via INTRAVENOUS
  Filled 2017-08-07: qty 50

## 2017-08-07 NOTE — Progress Notes (Signed)
PROGRESS NOTE    Lori [name]  EHO:[id number] DOB: 04/30/1951 DOA: 06/23/2017 PCP: [name], MD    Brief Narrative:  66 year old female presented with hematochezia. Patient is known to have type 2 diabetes mellitus, seizures, hypertension, GERD, cervical and appendiceal carcinoma, intracranial aneurysm status post repair 1997. Recent laparotomy June 26, for small bowel obstruction, then she was diagnosed with appendiceal adenocarcinoma, discharge to skilled nursing facility, wound VAC was placed. On initial physical examination she was noted to have frank hematochezia with large blood clots per rectum. Blood pressure 166/101, heart rate 94, respiratory rate 28, oxygen saturation 98%. Moist mucous membranes, lungs had decreased breath sounds at bases, positive rales, heart S1-S2 present rhythmic, the abdomen had tenderness to palpation, no rebound, wound VAC was in place, positive significant lower extremity edema +++ pitting up to the sacrum.   Patient was admitted to the hospital working diagnosis of lower GI bleed, complicated with intra-abdominal fluid collections, to rule out abscesses.   Prolonged hospital stay, patient with continuous intermittent GI bleeding and requiring blood transfusions (total 8 units). Very poor prognosis, patient's family had declined palliative care. Not a surgical candidate. Desmopressin has been resumed.    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 Acute  recurrent blood loss anemia due to lower GI bleed. Patient had a total8 units prbc since admission. Conservative medical care, not candidate for endoscopic procedures or surgery, will keep hb above 7. Patient continue to have profuse diarrhea, but no melena or hematochezia. C diff negative on 8/28.   2. Resolving sepsis due to bowel ischemia with pneumatosis. SPextensive laparotomy, drainage of abdominal abscess, repair of small bowel perforation, repair of small bowel enterotomy, enterocolostomy (mid ileum to mid transverse colon). Patient continue to tolerate po diet. Will need 2 more days of Augmentin to complete, 10 day course of antibiotic therapy, will follow cell count in am. Continue supportive care, will keep diet with dysphagia 1.   3. Leukocytosis with pyuria. Urine culture negative (multiple bacteria), suspected contamination. Ruled out current urine infection.   4. 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 Continue aggressive K and Mg repletion, schedule to have 130 meq of kcl today. Continue bicitra po, serum bicarbonate at 23. Magnesium sulfate IV 2 grams today, target Mg of 2.   5. Seizures. Patient continue good toleration to  po anti-seizure medications, lacosamide and keppra. Continue neuro checks per unit protocol. Disposition SNF.   6. 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 Continue to be very poor prognosis,  Not candidate for systemic chemotherapy, due very low performing status. No palliative care per patient's family request.   7. Stage II sacral ulcer. Continue local wound care, per unit protocol. Will place a  rectal tube to prevent wound  contamination.   8. Depression. On mirtazapine. No agitation or confusion.   DVT prophylaxis: Code Status: Family Communication: Disposition Plan:   Consultants:  Palliative care  Surgery  Procedures:    Antimicrobials:  Unasyn change to Augmentin    Subjective: Patient feeling better, no nausea or vomiting, no dyspnea or chest pain, continue to have diarrhea, but no bleeding. No abdominal pain.   Objective: Vitals:   08/06/17 0800 08/06/17 0900 08/06/17 1200 08/06/17 2110  BP: (!) 142/63 (!) 145/89 (!) 151/93   Pulse: (!) 102 100 98   Resp:   15   Temp:  98 F (36.7 C)  98.2 F (36.8 C)  TempSrc:  Oral  Oral  SpO2: 99% 100% 98%   Weight:      Height:        Intake/Output Summary (Last 24 hours) at 08/07/17 0817 Last data filed at 08/07/17 0800  Gross per 24 hour  Intake             1263 ml  Output             1950 ml  Net             -687 ml   Filed Weights   07/30/17 0500 08/01/17 0446 08/04/17 0400  Weight: 98.9 kg (218 lb) 99.8 kg (220 lb) 107.2 kg (236 lb 4.8 oz)    Examination:  General: deconditioned Neurology: Awake and alert, non focal  E ENT: positive pallor, no icterus, oral mucosa moist Cardiovascular: S1-S2 present, rhythmic, no gallops, rubs, or murmurs. No jugular venous distention, positive lower extremity edema +++. Pulmonary: vesicular breath sounds bilaterally, adequate air movement, no wheezing, rhonchi or rales. Gastrointestinal. Abdomen protuberant, no organomegaly, non tender, no rebound or guarding. Wound with dressing in place.  Skin. No rashes Musculoskeletal: no joint deformities     Data Reviewed: I have personally reviewed following labs and imaging studies  CBC:  Recent Labs Lab 08/02/17 0423 08/02/17 1513 08/02/17 2138 08/03/17 0617 08/03/17 0856 08/04/17 0415 08/05/17 0414  WBC 22.3* 15.7*  --  14.8*  --  12.8* 11.3*  NEUTROABS 20.7*  --   --   --   --  10.8* 9.4*  HGB 6.2*  6.3* 6.9*  7.9* 8.5* 8.6* 8.4* 8.6*  HCT 18.5*  19.4* 21.1* 23.9* 26.5* 26.4* 25.3* 26.0*  MCV 86.9 86.8  --  87.7  --  86.6 86.7  PLT 238 223  --  196  --  231 915   Basic Metabolic Panel:  Recent Labs Lab 08/04/17 1848 08/05/17 0414 08/05/17 1736 08/06/17 0516 08/07/17 0436  NA 145 150* 150* 150* 146*  K 2.8* 2.5* 3.0* 2.6* 2.6*  CL 118* 122* 120* 119* 117*  CO2 18* 21* 22 23 23   GLUCOSE 118* 115* 91 80 142*  BUN 17 15 11 9  5*  CREATININE 1.30* 1.18* 1.01* 0.97 0.80  CALCIUM 7.3* 7.5* 7.5* 7.3* 6.9*  MG  --   --   --  1.3* 1.5*   GFR: Estimated Creatinine Clearance: 88.7 mL/min (by C-G formula based on SCr of 0.8 mg/dL). Liver Function Tests: No results for input(s): AST, ALT, ALKPHOS, BILITOT, PROT, ALBUMIN in the last 168 hours. No results for input(s): LIPASE, AMYLASE in the last 168 hours. No results for input(s): AMMONIA in the last 168 hours. Coagulation Profile: No results for input(s): INR, PROTIME in the last 168 hours. Cardiac Enzymes: No results for input(s): CKTOTAL, CKMB, CKMBINDEX,  TROPONINI in the last 168 hours. BNP (last 3 results) No results for input(s): PROBNP in the last 8760 hours. HbA1C: No results for input(s): HGBA1C in the last 72 hours. CBG:  Recent Labs Lab 08/06/17 0630 08/06/17 1126 08/06/17 1606 08/06/17 2041 08/07/17 0646  GLUCAP 77 76 99 88 145*   Lipid Profile: No results for input(s): CHOL, HDL, LDLCALC, TRIG, CHOLHDL, LDLDIRECT in the last 72 hours. Thyroid Function Tests: No results for input(s): TSH, T4TOTAL, FREET4, T3FREE, THYROIDAB in the last 72 hours. Anemia Panel: No results for input(s): VITAMINB12, FOLATE, FERRITIN, TIBC, IRON, RETICCTPCT in the last 72 hours.    Radiology Studies: I have reviewed all of the imaging during this hospital visit personally     Scheduled Meds: . amoxicillin-clavulanate  1 tablet Oral Q12H  . collagenase   Topical Daily  . desmopressin  10 mcg Nasal Daily  . feeding supplement  1  Container Oral TID BM  . folic acid  1 mg Oral Daily  . insulin aspart  0-15 Units Subcutaneous TID WC  . insulin aspart  0-5 Units Subcutaneous QHS  . lacosamide  100 mg Oral BID  . levETIRAcetam  1,000 mg Oral BID  . magnesium chloride  1 tablet Oral Daily  . mirtazapine  7.5 mg Oral QHS  . multivitamin with minerals  1 tablet Oral Daily  . sodium citrate-citric acid  30 mL Oral TID PC & HS   Continuous Infusions: . potassium chloride 10 mEq (08/07/17 0745)     LOS: 45 days      Anicka Stuckert Gerome Apley, MD [facility] Pager [phone]

## 2017-08-07 NOTE — Progress Notes (Signed)
Clinical Social Worker continuing to follow patient for medical stability and disposition plan   Rhea Pink, MSW,  [place] [phone]

## 2017-08-07 NOTE — Progress Notes (Signed)
CRITICAL VALUE ALERT  Critical Value: K+ 2.6  Date & Time Notied:  6:15am 08/07/17  Provider Notified: K Schorr  Orders Received/Actions taken:   potassium chloride 10 mEq in 100 mL IVPB Start: 08/07/17 0700, End: 08/07/17 1100, 10 mEq, Intravenous, 100 mL/hr, Every 1 hr x 5

## 2017-08-07 NOTE — Progress Notes (Signed)
Physical Therapy Treatment Patient Details Name: Lori [name] MRN: [id number] DOB: Jul 29, 1951 Today's Date: 08/07/2017    History of Present Illness pt is a 66 y/o female with pmh significant for DM , Seizure d/o HTN, h/o brain aneurysm, cervical CA, metastatic mucinous adenocarcinoma of the appendix, admitted from SNF with c/o bloody bowel movements.  In hospital pt appeared to have seizure resulting in encephalopathy.  CT/MRI's suggest L thalamic, L cerebellar and right frontal lobe encephalomalacia and pt has had significant issues with drop in Hgb.    PT Comments    Patient tolerated later scoot transfer to chair with +3 for physical assist and safety. Pt with no c/o pain with mobility and eager to get OOB. Pt tachycardic upon transfer to chair with HR up to 150. Continue to progress as tolerated.    Follow Up Recommendations  SNF;Supervision/Assistance - 24 hour     Equipment Recommendations  Wheelchair (measurements PT)    Recommendations for Other Services       Precautions / Restrictions Precautions Precautions: Fall Precaution Comments: foley; rectal tube    Mobility  Bed Mobility Overal bed mobility: Needs Assistance Bed Mobility: Supine to Sit     Supine to sit: +2 for physical assistance;HOB elevated;Max assist     General bed mobility comments: cues for sequencing and technique; assist to bring bilat LE to EOB and to elevate trunk into sitting   Transfers Overall transfer level: Needs assistance Equipment used: None Transfers: Lateral/Scoot Transfers          Lateral/Scoot Transfers: Max assist (+3) General transfer comment: +2 face to face with use of gait belt and 3rd person behind pt to scoot from EOB to drop arm recliner with use of bed pads; cues for hand placement and sequencing; pt able to maintain anterior translation to unweigth hips and assist with R UE   Ambulation/Gait                 Stairs            Wheelchair  Mobility    Modified Rankin (Stroke Patients Only)       Balance Overall balance assessment: Needs assistance Sitting-balance support: Single extremity supported;Feet supported Sitting balance-Leahy Scale: Poor Sitting balance - Comments: R UE for support; mod/max A to maintain sitting balance EOB with cues for anterior lean Postural control: Posterior lean;Left lateral lean                                  Cognition Arousal/Alertness: Awake/alert Behavior During Therapy: WFL for tasks assessed/performed Overall Cognitive Status: Within Functional Limits for tasks assessed Area of Impairment: Following commands;Safety/judgement;Problem solving                       Following Commands: Follows one step commands with increased time (or tries, but cannot (weak)) Safety/Judgement: Decreased awareness of deficits Awareness: Intellectual Problem Solving: Slow processing;Decreased initiation;Difficulty sequencing;Requires tactile cues;Requires verbal cues        Exercises General Exercises - Lower Extremity Ankle Circles/Pumps: AROM;Both;Seated Long Arc Quad: Both;Seated;AAROM Hip Flexion/Marching: AAROM;Strengthening;Both;Seated Other Exercises Other Exercises: resisted knee flexion    General Comments General comments (skin integrity, edema, etc.): after transfer to chair pt with tachycardia and HR up to upper 150; pt asymptomatic      Pertinent Vitals/Pain Pain Assessment: No/denies pain    Home Living  Prior Function            PT Goals (current goals can now be found in the care plan section) Acute Rehab PT Goals PT Goal Formulation: With patient Time For Goal Achievement: 08/06/17 Potential to Achieve Goals: Fair Progress towards PT goals: Progressing toward goals    Frequency    Min 2X/week      PT Plan Current plan remains appropriate    Co-evaluation PT/OT/SLP Co-Evaluation/Treatment:  Yes Reason for Co-Treatment: For patient/therapist safety;To address functional/ADL transfers          AM-PAC PT "6 Clicks" Daily Activity  Outcome Measure  Difficulty turning over in bed (including adjusting bedclothes, sheets and blankets)?: Unable Difficulty moving from lying on back to sitting on the side of the bed? : Unable Difficulty sitting down on and standing up from a chair with arms (e.g., wheelchair, bedside commode, etc,.)?: Unable Help needed moving to and from a bed to chair (including a wheelchair)?: A Lot Help needed walking in hospital room?: Total Help needed climbing 3-5 steps with a railing? : Total 6 Click Score: 7    End of Session Equipment Utilized During Treatment: Gait belt Activity Tolerance: Patient tolerated treatment well Patient left: with call bell/phone within reach;with family/visitor present;in chair;with chair alarm set Nurse Communication: Mobility status;Need for lift equipment PT Visit Diagnosis: Other abnormalities of gait and mobility (R26.89);Other symptoms and signs involving the nervous system (R29.898);Muscle weakness (generalized) (M62.81) Pain - Right/Left: Left Pain - part of body:  (abdomen)     Time: 1410-1441 PT Time Calculation (min) (ACUTE ONLY): 31 min  Charges:  $Therapeutic Activity: 8-22 mins                    G Codes:       Earney Navy, PTA Pager: [phone]     Darliss Cheney 08/07/2017, 3:38 PM

## 2017-08-07 NOTE — Progress Notes (Signed)
Occupational Therapy Treatment Patient Details Name: Lori [name] MRN: [id number] DOB: Apr 12, 1951 Today's Date: 08/07/2017    History of present illness pt is a 66 y/o female with pmh significant for DM , Seizure d/o HTN, h/o brain aneurysm, cervical CA, metastatic mucinous adenocarcinoma of the appendix, admitted from SNF with c/o bloody bowel movements.  In hospital pt appeared to have seizure resulting in encephalopathy.  CT/MRI's suggest L thalamic, L cerebellar and right frontal lobe encephalomalacia and pt has had significant issues with drop in Hgb.   OT comments  Pt demonstrating progress toward OT goals this session. She was willing and eager to participate in therapy session today. Pt was able to complete lateral scoot transfer from bed to chair in preparation for improved mobility and ADL participation with max assist +3. Episode of tachycardia up to 150 upon transfer to chair and quickly resolved. She continues to be confused but able to sustain attention to task with intermittent cues. Will continue to follow while admitted.   Follow Up Recommendations  SNF;Supervision/Assistance - 24 hour    Equipment Recommendations  None recommended by OT    Recommendations for Other Services      Precautions / Restrictions Precautions Precautions: Fall Precaution Comments: foley; rectal tube Restrictions Weight Bearing Restrictions: No       Mobility Bed Mobility Overal bed mobility: Needs Assistance Bed Mobility: Supine to Sit Rolling:  (Can initiate rolling with L UE but unable to sustain position) Sidelying to sit: Total assist;HOB elevated;+2 for physical assistance Supine to sit: +2 for physical assistance;HOB elevated;Max assist Sit to supine: Total assist;+2 for physical assistance Sit to sidelying: Total assist;+2 for physical assistance General bed mobility comments: Cues for sequencing with assistance to bring bilateral LE to EOB and elevate trunk into sitting.    Transfers Overall transfer level: Needs assistance Equipment used: None Transfers: Lateral/Scoot Transfers          Lateral/Scoot Transfers: Max assist (+3) General transfer comment: +2 face to face with use of gait belt and 3rd person behind pt to scoot from EOB to drop arm recliner with use of bed pads; cues for hand placement and sequencing; pt able to maintain anterior translation to unweigth hips and assist with R UE     Balance Overall balance assessment: Needs assistance Sitting-balance support: Single extremity supported;Feet supported Sitting balance-Leahy Scale: Poor Sitting balance - Comments: R UE for support; mod/max A to maintain sitting balance EOB with cues for anterior lean Postural control: Posterior lean;Left lateral lean                                 ADL either performed or assessed with clinical judgement   ADL Overall ADL's : Needs assistance/impaired Eating/Feeding: Supervision/ safety;Sitting Eating/Feeding Details (indicate cue type and reason): with proper positioning and set up Grooming: Minimal assistance;Sitting                   Toilet Transfer: Maximal assistance;Requires drop arm (+3 assist) Toilet Transfer Details (indicate cue type and reason): Simulated from bed to chair with +3 assistance. Lateral scoot           General ADL Comments: Pt requiring cues to maintain balance seated at EOB during preparation for simulated toilet transfers.      Vision Patient Visual Report: No change from baseline     Perception     Praxis      Cognition Arousal/Alertness: Awake/alert  Behavior During Therapy: WFL for tasks assessed/performed Overall Cognitive Status: Within Functional Limits for tasks assessed Area of Impairment: Following commands;Safety/judgement;Problem solving                   Current Attention Level: Sustained   Following Commands: Follows one step commands with increased time;Follows one  step commands inconsistently (inconsistent ability and easily distracted) Safety/Judgement: Decreased awareness of deficits Awareness: Intellectual Problem Solving: Slow processing;Decreased initiation;Difficulty sequencing;Requires tactile cues;Requires verbal cues General Comments: Very willing to participate. Confused and reporting she has been seeing geese in her room. Decreased problem solving skills and reasoning impacting safety.         Exercises Exercises: General Lower Extremity General Exercises - Lower Extremity Ankle Circles/Pumps: AROM;Both;Seated Long Arc Quad: Both;Seated;AAROM Hip Flexion/Marching: AAROM;Strengthening;Both;Seated Other Exercises Other Exercises: resisted knee flexion   Shoulder Instructions       General Comments Following transfer to chair, tachycardia and HR up to 150s and pt asymptomatic. Quickly resolved to 120s.     Pertinent Vitals/ Pain       Pain Assessment: No/denies pain  Home Living                                          Prior Functioning/Environment              Frequency  Min 2X/week        Progress Toward Goals  OT Goals(current goals can now be found in the care plan section)  Progress towards OT goals: Progressing toward goals  Acute Rehab OT Goals Patient Stated Goal: to get stronger OT Goal Formulation: With patient Time For Goal Achievement: 08/24/2017 Potential to Achieve Goals: [place] Discharge plan remains appropriate    Co-evaluation    PT/OT/SLP Co-Evaluation/Treatment: Yes Reason for Co-Treatment: For patient/therapist safety;To address functional/ADL transfers   OT goals addressed during session: ADL's and self-care;Strengthening/ROM      AM-PAC PT "6 Clicks" Daily Activity     Outcome Measure   Help from another person eating meals?: A Lot Help from another person taking care of personal grooming?: A Lot Help from another person toileting, which includes using toliet,  bedpan, or urinal?: A Lot Help from another person bathing (including washing, rinsing, drying)?: Total Help from another person to put on and taking off regular upper body clothing?: Total Help from another person to put on and taking off regular lower body clothing?: Total 6 Click Score: 9    End of Session    OT Visit Diagnosis: Other abnormalities of gait and mobility (R26.89);Muscle weakness (generalized) (M62.81);Other symptoms and signs involving cognitive function   Activity Tolerance Patient tolerated treatment well   Patient Left in bed;with call bell/phone within reach;with family/visitor present   Nurse Communication Mobility status        Time: 1443-1540 OT Time Calculation (min): 32 min  Charges: OT General Charges $OT Visit: 1 Visit OT Treatments $Self Care/Home Management : 8-22 mins  Norman Herrlich, MS OTR/L  Pager: [place] 08/07/2017, 5:25 PM

## 2017-08-07 NOTE — Care Management Important Message (Signed)
Important Message  Patient Details  Name: Lori [name] MRN: [id number] Date of Birth: 01/15/1951   Medicare Important Message Given:  Yes    [name] [name] 08/07/2017, 11:05 AM

## 2017-08-07 NOTE — Progress Notes (Signed)
Physical Therapy Wound Treatment Patient Details  Name: Johnathan Tortorelli MRN: [id number] Date of Birth: Jun 13, 1951  Today's Date: 08/07/2017 Time: 1200-1250 Time Calculation (min): 50 min  Subjective  Subjective: Pt pleasant and agreeable to therapy Patient and Family Stated Goals: Heal wound  Pain Score:  Pt did not indicate pain throughout session  Wound Assessment  Pressure Injury 07/14/17 Unstageable - Full thickness tissue loss in which the base of the ulcer is covered by slough (yellow, tan, gray, green or brown) and/or eschar (tan, brown or black) in the wound bed. (Active)  Dressing Type Foam 08/07/2017 12:08 PM  Dressing Changed 08/07/2017 12:08 PM  Dressing Change Frequency PRN 08/07/2017 12:08 PM  State of Healing Non-healing 08/07/2017 12:08 PM  Site / Wound Assessment Black;Pink 08/07/2017 12:08 PM  % Wound base Red or Granulating 0% 08/07/2017 12:08 PM  % Wound base Yellow/Fibrinous Exudate 0% 08/07/2017 12:08 PM  % Wound base Black/Eschar 100% 08/07/2017 12:08 PM  % Wound base Other/Granulation Tissue (Comment) 0% 08/07/2017 12:08 PM  Peri-wound Assessment Intact;Purple;Black 08/07/2017 12:08 PM  Wound Length (cm) 7.6 cm 08/06/2017  9:36 AM  Wound Width (cm) 5.6 cm 08/06/2017  9:36 AM  Wound Depth (cm) 0.7 cm 08/06/2017  9:36 AM  Margins Unattached edges (unapproximated) 08/07/2017 12:08 PM  Drainage Amount Scant 08/07/2017 12:08 PM  Drainage Description Serosanguineous 08/07/2017 12:08 PM  Treatment Debridement (Selective);Hydrotherapy (Pulse lavage);Packing (Saline gauze) 08/07/2017 12:08 PM     Pressure Injury 07/14/17 Stage II -  Partial thickness loss of dermis presenting as a shallow open ulcer with a red, pink wound bed without slough. (Active)  Dressing Type Foam 08/07/2017 10:46 AM  Dressing Changed 08/07/2017 10:46 AM  Dressing Change Frequency PRN 08/07/2017 10:46 AM  Site / Wound Assessment Black;Pink;Yellow 08/07/2017 10:46 AM  % Wound base Red or Granulating 45% 08/07/2017 10:46 AM  % Wound  base Yellow/Fibrinous Exudate 80% 08/07/2017 10:46 AM  % Wound base Black/Eschar 5% 08/07/2017 10:46 AM  Peri-wound Assessment Intact 08/03/2017  8:00 PM  Wound Length (cm) 0.8 cm 07/15/2017  5:52 AM  Wound Width (cm) 0.5 cm 07/15/2017  5:52 AM  Wound Depth (cm) 0.1 cm 07/15/2017  5:52 AM  Margins Unattached edges (unapproximated) 08/04/2017 11:09 AM  Drainage Amount Scant 08/04/2017 11:09 AM  Drainage Description Serosanguineous 08/04/2017 11:09 AM  Treatment Cleansed 08/04/2017 11:09 AM  Santyl applied to wound bed prior to applying dressing.     Hydrotherapy Pulsed lavage therapy - wound location: L buttock Pulsed Lavage with Suction (psi): 12 psi Pulsed Lavage with Suction - Normal Saline Used: 1000 mL Pulsed Lavage Tip: Tip with splash shield Selective Debridement Selective Debridement - Location: L buttock Selective Debridement - Tools Used: Forceps;Scalpel Selective Debridement - Tissue Removed: eschar   Wound Assessment and Plan  Wound Therapy - Assess/Plan/Recommendations Wound Therapy - Clinical Statement: Pt presents to hydrotherapy with unstagable pressure injury. Pt will benefit from continued hydrotherapy for selective removal of nonviable tissue and to promote wound bed healing.  Wound Therapy - Functional Problem List: Decreased mobility Factors Delaying/Impairing Wound Healing: Altered sensation;Diabetes Mellitus;Incontinence;Immobility;Multiple medical problems Hydrotherapy Plan: Debridement;Dressing change;Patient/family education;Pulsatile lavage with suction Wound Therapy - Frequency: 6X / week Wound Therapy - Follow Up Recommendations: Skilled nursing facility Wound Plan: See above  Wound Therapy Goals- Improve the function of patient's integumentary system by progressing the wound(s) through the phases of wound healing (inflammation - proliferation - remodeling) by: Decrease Necrotic Tissue to: 25% Decrease Necrotic Tissue - Progress: Progressing toward goal Increase  Granulation Tissue to: 75% Increase Granulation Tissue - Progress: Progressing toward goal Goals/treatment plan/discharge plan were made with and agreed upon by patient/family: Yes Time For Goal Achievement: 7 days Wound Therapy - Potential for Goals: Good  Goals will be updated until maximal potential achieved or discharge criteria met.  Discharge criteria: when goals achieved, discharge from hospital, MD decision/surgical intervention, no progress towards goals, refusal/missing three consecutive treatments without notification or medical reason.  GP     Thelma Comp 08/07/2017, 1:48 PM  Rolinda Roan, PT, DPT Acute Rehabilitation Services Pager: [phone]

## 2017-08-08 LAB — CBC WITH DIFFERENTIAL/PLATELET
BASOS PCT: 0 %
Basophils Absolute: 0 10*3/uL (ref 0.0–0.1)
EOS ABS: 0.2 10*3/uL (ref 0.0–0.7)
Eosinophils Relative: 2 %
HEMATOCRIT: 24.5 % — AB (ref 36.0–46.0)
HEMOGLOBIN: 7.9 g/dL — AB (ref 12.0–15.0)
LYMPHS ABS: 0.9 10*3/uL (ref 0.7–4.0)
Lymphocytes Relative: 9 %
MCH: 28.8 pg (ref 26.0–34.0)
MCHC: 32.2 g/dL (ref 30.0–36.0)
MCV: 89.4 fL (ref 78.0–100.0)
Monocytes Absolute: 0.5 10*3/uL (ref 0.1–1.0)
Monocytes Relative: 5 %
NEUTROS PCT: 84 %
Neutro Abs: 8.8 10*3/uL — ABNORMAL HIGH (ref 1.7–7.7)
Platelets: 329 10*3/uL (ref 150–400)
RBC: 2.74 MIL/uL — AB (ref 3.87–5.11)
RDW: 16.1 % — ABNORMAL HIGH (ref 11.5–15.5)
WBC: 10.5 10*3/uL (ref 4.0–10.5)

## 2017-08-08 LAB — GLUCOSE, CAPILLARY
GLUCOSE-CAPILLARY: 85 mg/dL (ref 65–99)
Glucose-Capillary: 96 mg/dL (ref 65–99)
Glucose-Capillary: 160 mg/dL — ABNORMAL HIGH (ref 65–99)
Glucose-Capillary: 111 mg/dL — ABNORMAL HIGH (ref 65–99)

## 2017-08-08 LAB — BASIC METABOLIC PANEL
ANION GAP: 4 — AB (ref 5–15)
BUN: <5 mg/dL — ABNORMAL LOW (ref 6–20)
CHLORIDE: 117 mmol/L — AB (ref 101–111)
CO2: 25 mmol/L (ref 22–32)
CREATININE: 0.72 mg/dL (ref 0.44–1.00)
Calcium: 7.1 mg/dL — ABNORMAL LOW (ref 8.9–10.3)
GFR calc non Af Amer: >60 mL/min (ref >60)
Glucose, Bld: 105 mg/dL — ABNORMAL HIGH (ref 65–99)
Potassium: 2.9 mmol/L — ABNORMAL LOW (ref 3.5–5.1)
SODIUM: 146 mmol/L — AB (ref 135–145)

## 2017-08-08 LAB — MAGNESIUM: Magnesium: 1.6 mg/dL — ABNORMAL LOW (ref 1.7–2.4)

## 2017-08-08 MED ORDER — POTASSIUM CHLORIDE 10 MEQ/100ML IV SOLN
10.0000 meq | INTRAVENOUS | Status: AC
  Administered 2017-08-08 (×3): 10 meq via INTRAVENOUS
  Filled 2017-08-08 (×2): qty 100

## 2017-08-08 MED ORDER — LOPERAMIDE HCL 2 MG PO CAPS
2.0000 mg | ORAL_CAPSULE | Freq: Two times a day (BID) | ORAL | Status: DC
  Administered 2017-08-08 – 2017-08-10 (×4): 2 mg via ORAL
  Filled 2017-08-08 (×4): qty 1

## 2017-08-08 MED ORDER — POTASSIUM CHLORIDE CRYS ER 20 MEQ PO TBCR
40.0000 meq | EXTENDED_RELEASE_TABLET | ORAL | Status: AC
  Administered 2017-08-08 (×4): 40 meq via ORAL
  Filled 2017-08-08 (×4): qty 2

## 2017-08-08 MED ORDER — MAGNESIUM SULFATE 4 GM/100ML IV SOLN
4.0000 g | Freq: Once | INTRAVENOUS | Status: AC
  Administered 2017-08-08: 4 g via INTRAVENOUS
  Filled 2017-08-08: qty 100

## 2017-08-08 MED ORDER — POTASSIUM CHLORIDE 10 MEQ/100ML IV SOLN
INTRAVENOUS | Status: AC
  Administered 2017-08-08: 10 meq
  Filled 2017-08-08: qty 100

## 2017-08-08 MED ORDER — POTASSIUM CHLORIDE 10 MEQ/100ML IV SOLN
INTRAVENOUS | Status: AC
  Filled 2017-08-08: qty 100

## 2017-08-08 NOTE — Progress Notes (Signed)
Physical Therapy Wound Treatment Patient Details  Name: Lori [name] MRN: [id number] Date of Birth: 12/14/1950  Today's Date: 08/08/2017 Time: 1115-1157 Time Calculation (min): 42 min  Subjective  Subjective: Pt pleasant and agreeable to therapy Patient and Family Stated Goals: Heal wound  Pain Score:  Pt did not report any pain throughout session  Wound Assessment  Pressure Injury 07/14/17 Unstageable - Full thickness tissue loss in which the base of the ulcer is covered by slough (yellow, tan, gray, green or brown) and/or eschar (tan, brown or black) in the wound bed. (Active)  Dressing Type Foam 08/08/2017 12:08 PM  Dressing Changed 08/08/2017 12:08 PM  Dressing Change Frequency PRN 08/08/2017 12:08 PM  State of Healing Non-healing 08/08/2017 12:08 PM  Site / Wound Assessment Black;Pink 08/08/2017 12:08 PM  % Wound base Red or Granulating 0% 08/08/2017 12:08 PM  % Wound base Yellow/Fibrinous Exudate 90% 08/08/2017 12:08 PM  % Wound base Black/Eschar 10% 08/08/2017 12:08 PM  % Wound base Other/Granulation Tissue (Comment) 0% 08/08/2017 12:08 PM  Peri-wound Assessment Intact;Purple;Black 08/08/2017 12:08 PM  Wound Length (cm) 7.6 cm 08/06/2017  9:36 AM  Wound Width (cm) 5.6 cm 08/06/2017  9:36 AM  Wound Depth (cm) 0.7 cm 08/06/2017  9:36 AM  Margins Unattached edges (unapproximated) 08/08/2017 12:08 PM  Drainage Amount Minimal 08/08/2017 12:08 PM  Drainage Description Serosanguineous 08/08/2017 12:08 PM  Treatment Debridement (Selective);Hydrotherapy (Pulse lavage);Packing (Saline gauze) 08/08/2017 12:08 PM  Santyl applied to wound bed prior to applying dressing.   Hydrotherapy Pulsed lavage therapy - wound location: L buttock Pulsed Lavage with Suction (psi): 12 psi Pulsed Lavage with Suction - Normal Saline Used: 1000 mL Pulsed Lavage Tip: Tip with splash shield Selective Debridement Selective Debridement - Location: L buttock Selective Debridement - Tools Used: Forceps;Scalpel Selective Debridement -  Tissue Removed: eschar   Wound Assessment and Plan  Wound Therapy - Assess/Plan/Recommendations Wound Therapy - Clinical Statement: Pt presents to hydrotherapy with unstagable pressure injury. Pt will benefit from continued hydrotherapy for selective removal of nonviable tissue and to promote wound bed healing.  Wound Therapy - Functional Problem List: Decreased mobility Factors Delaying/Impairing Wound Healing: Altered sensation;Diabetes Mellitus;Incontinence;Immobility;Multiple medical problems Hydrotherapy Plan: Debridement;Dressing change;Patient/family education;Pulsatile lavage with suction Wound Therapy - Frequency: 6X / week Wound Therapy - Follow Up Recommendations: Skilled nursing facility Wound Plan: See above  Wound Therapy Goals- Improve the function of patient's integumentary system by progressing the wound(s) through the phases of wound healing (inflammation - proliferation - remodeling) by: Decrease Necrotic Tissue to: 25% Decrease Necrotic Tissue - Progress: Progressing toward goal Increase Granulation Tissue to: 75% Increase Granulation Tissue - Progress: Progressing toward goal Goals/treatment plan/discharge plan were made with and agreed upon by patient/family: Yes Time For Goal Achievement: 7 days Wound Therapy - Potential for Goals: Good  Goals will be updated until maximal potential achieved or discharge criteria met.  Discharge criteria: when goals achieved, discharge from hospital, MD decision/surgical intervention, no progress towards goals, refusal/missing three consecutive treatments without notification or medical reason.  GP     Laura D Kirkman 08/08/2017, 1:19 PM   Laura Kirkman, PT, DPT Acute Rehabilitation Services Pager: [phone]    

## 2017-08-08 NOTE — Progress Notes (Addendum)
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 She was recently hospitalized for small bowel obstruction and underwent diagnostic laparoscopy in June. She underwent repair of small bowel perforation. She had a prolonged hospital stay. Subsequently was discharged to skilled nursing facility.   Presented back with rectal bleeding. Seen by oncology and GI. Not thought to be a candidate for any treatment.  She had what appears to be a seizure resulting in encephalopathy on 7/26. Neurology was consulted and started her on AEDs. Encephalopathy eventually improved.  Underwent MBS, started on dysphagia 2 diet however due to ongoingl poor oral intake NGT was continued for a while longer. It has since been removed and she is tolerating food.    Subjective: No complaints today other than not wanting pureed food.  ROS: no complaints of nausea, vomiting,  cough, dyspnea or dysuria. No other complaints.   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 [name]: 1. 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  2. Exploratory laparotomy, drainage of abdominal abscess, repair of small bowel perforation, repair of small bowel enterotomy, enterocolostomy (mid ileum to mid transverse colon). - current open abdominal wound>>> per gen surgery "local wound care, can continue some debridement but this  is unlikely to ever heal at this point"   Severe sepsis secondary to pneumatosis and bowel ischemia -Placed on vancomycin and Zosyn initially however vancomycin was discontinued as there is no evidence of MRSA -Patient still not candidate for surgery per general surgery -Blood cultures show no growth to date   - has been on antibiotics since 7/21- now on Augmentin- WBC 10- will d/c today  Hypokalemia/ hypomag - replace aggressively - give Lomotil for watery stool which may be the cause  GI bleed with acute blood loss anemia - GI has not scoped her because of her co-morbidities and has signed off - she has had diarrhea and has been c diff negative - orange water noted in rectal tube today- give Lomotil- follow  Seizure / CVA and bilateral subdural Hematomas -Patient was noted to have garbled speech on the afternoon of 06/28/2017  -Neurology consultation appreciated -CT head on 8/1: Evolving encephalomalacia at suspected left thalamic lacunar infarct. Previously identified tiny bilateral subdural hematoma -MRI brain on 7/29: New tiny bilateral subdural hematomas, trace subarachnoid hemorrhage, punctate focus of diffusion abnormality in the dorsal thalamus -It was thought the patient was having seizure activity and she was loaded with Keppra and then Vimpat - continuous EEG monitoring: Progressively improving diffuse encephalopathy    Diabetes insipidus/polyuria/Hypernatremia  - desmopressin  DM2 - SSI  Stage II pressure ulcer of the buttocks -Continue wound care  Essential hypertension -Continue to remain stable -Continue clonidine, and PRN hydralazine  Enterococcus UTI -resolved, treated with unasyn  DVT prophylaxis: SCDs Code Status: Full code Family Communication:  Disposition Plan: SNF when electrolytes improved Consultants:  PCCM General surgery Neurology  Oncology Palliative care Procedures:  Continuous EEG Antimicrobials:  Anti-infectives    Start  Dose/Rate Route Frequency Ordered Stop   08/05/17 1600  amoxicillin-clavulanate (AUGMENTIN) 875-125 MG per tablet 1 tablet     1 tablet Oral Every 12 hours 08/05/17 1405 08/09/17 0959   07/30/17 1300  Ampicillin-Sulbactam (UNASYN) 3 g in sodium chloride 0.9 % 100 mL IVPB  Status:  Discontinued     3 g 200 mL/hr over 30 Minutes Intravenous Every 8 hours 07/30/17 1208 08/05/17 1405   07/30/17 1300  fluconazole (DIFLUCAN) IVPB 200 mg  Status:  Discontinued     200 mg 100 mL/hr over 60 Minutes Intravenous Every 24 hours 07/30/17 1208 08/04/17 0844   07/22/17 0500  vancomycin (VANCOCIN) IVPB 1000 mg/200 mL premix  Status:  Discontinued     1,000 mg 200 mL/hr over 60 Minutes Intravenous Every 12 hours 07/21/17 1545 07/24/17 1047   07/21/17 2200  piperacillin-tazobactam (ZOSYN) IVPB 3.375 g  Status:  Discontinued     3.375 g 12.5 mL/hr over 240 Minutes Intravenous Every 8 hours 07/21/17 1545 07/30/17 1123   07/21/17 1630  vancomycin (VANCOCIN) 1,500 mg in sodium chloride 0.9 % 500 mL IVPB     1,500 mg 250 mL/hr over 120 Minutes Intravenous  Once 07/21/17 1545 07/22/17 0000   07/21/17 1500  piperacillin-tazobactam (ZOSYN) IVPB 3.375 g     3.375 g 100 mL/hr over 30 Minutes Intravenous  Once 07/21/17 1449 07/21/17 1638   07/04/17 1100  Ampicillin-Sulbactam (UNASYN) 3 g in sodium chloride 0.9 % 100 mL IVPB     3 g 200 mL/hr over 30 Minutes Intravenous Every 6 hours 07/04/17 0823 07/10/17 0429   07/03/17 1600  Ampicillin-Sulbactam (UNASYN) 3 g in sodium chloride 0.9 % 100 mL IVPB  Status:  Discontinued     3 g 200 mL/hr over 30 Minutes Intravenous Every 6 hours 07/03/17 1047 07/04/17 0823   06/29/17 1400  ceFEPIme (MAXIPIME) 2 g in dextrose 5 % 50 mL IVPB  Status:  Discontinued     2 g 100 mL/hr over 30 Minutes Intravenous Every 8 hours 06/29/17 0853 06/29/17 0857   06/29/17 1000  ceFEPIme (MAXIPIME) 2 g in dextrose 5 % 50 mL IVPB  Status:  Discontinued     2 g 100 mL/hr over 30 Minutes  Intravenous Every 8 hours 06/29/17 0857 07/03/17 1046   06/29/17 0930  metroNIDAZOLE (FLAGYL) IVPB 500 mg  Status:  Discontinued     500 mg 100 mL/hr over 60 Minutes Intravenous Every 8 hours 06/29/17 0838 06/30/17 0956   06/24/17 0600  piperacillin-tazobactam (ZOSYN) IVPB 3.375 g  Status:  Discontinued     3.375 g 12.5 mL/hr over 240 Minutes Intravenous Every 8 hours 06/23/17 2254 06/29/17 0838   06/23/17 2300  piperacillin-tazobactam (ZOSYN) IVPB 3.375 g     3.375 g 100 mL/hr over 30 Minutes Intravenous  Once 06/23/17 2254 06/24/17 0056       Objective: Vitals:   08/07/17 2100 08/08/17 0059 08/08/17 0400 08/08/17 0500  BP:  (!) 151/93 (!) 152/89   Pulse: 99 (!) 106 99   Resp: 15 (!) 23 20   Temp:  99.8 F (37.7 C) 99.8 F (37.7 C)   TempSrc:  Oral Oral   SpO2: 99% 97% 97%   Weight:    105 kg (231 lb 7.7 oz)  Height:        Intake/Output Summary (Last 24 hours) at 08/08/17 0838 Last data filed at 08/08/17 0513  Gross per 24 hour  Intake  270 ml  Output             2450 ml  Net            -2180 ml   Filed Weights   08/01/17 0446 08/04/17 0400 08/08/17 0500  Weight: 99.8 kg (220 lb) 107.2 kg (236 lb 4.8 oz) 105 kg (231 lb 7.7 oz)    Examination: General exam: Appears comfortable  HEENT: PERRLA, oral mucosa moist, no sclera icterus or thrush Respiratory system: Clear to auscultation. Respiratory effort normal. Cardiovascular system: S1 & S2 heard, RRR.  No murmurs  Gastrointestinal system: Abdomen soft, non-tender, nondistended. Normal bowel sound.   Central nervous system: Alert and oriented. Moves right arm only today Extremities: No cyanosis, clubbing or edema Skin: open wound in lower abdomen Psychiatry:  Mood & affect appropriate.     Data Reviewed: I have personally reviewed following labs and imaging studies  CBC:  Recent Labs Lab 08/02/17 0423 08/02/17 1513  08/03/17 0617 08/03/17 0856 08/04/17 0415 08/05/17 0414 08/08/17 0414  WBC  22.3* 15.7*  --  14.8*  --  12.8* 11.3* 10.5  NEUTROABS 20.7*  --   --   --   --  10.8* 9.4* 8.8*  HGB 6.2*  6.3* 6.9*  < > 8.5* 8.6* 8.4* 8.6* 7.9*  HCT 18.5*  19.4* 21.1*  < > 26.5* 26.4* 25.3* 26.0* 24.5*  MCV 86.9 86.8  --  87.7  --  86.6 86.7 89.4  PLT 238 223  --  196  --  231 261 329  < > = values in this interval not displayed. Basic Metabolic Panel:  Recent Labs Lab 08/05/17 0414 08/05/17 1736 08/06/17 0516 08/07/17 0436 08/08/17 0414  NA 150* 150* 150* 146* 146*  K 2.5* 3.0* 2.6* 2.6* 2.9*  CL 122* 120* 119* 117* 117*  CO2 21* 22 23 23 25   GLUCOSE 115* 91 80 142* 105*  BUN 15 11 9  5* <5*  CREATININE 1.18* 1.01* 0.97 0.80 0.72  CALCIUM 7.5* 7.5* 7.3* 6.9* 7.1*  MG  --   --  1.3* 1.5* 1.6*   GFR: Estimated Creatinine Clearance: 87.7 mL/min (by C-G formula based on SCr of 0.72 mg/dL). Liver Function Tests: No results for input(s): AST, ALT, ALKPHOS, BILITOT, PROT, ALBUMIN in the last 168 hours. No results for input(s): LIPASE, AMYLASE in the last 168 hours. No results for input(s): AMMONIA in the last 168 hours. Coagulation Profile: No results for input(s): INR, PROTIME in the last 168 hours. Cardiac Enzymes: No results for input(s): CKTOTAL, CKMB, CKMBINDEX, TROPONINI in the last 168 hours. BNP (last 3 results) No results for input(s): PROBNP in the last 8760 hours. HbA1C: No results for input(s): HGBA1C in the last 72 hours. CBG:  Recent Labs Lab 08/07/17 0646 08/07/17 1109 08/07/17 1642 08/07/17 2218 08/08/17 0605  GLUCAP 145* 124* 141* 120* 96   Lipid Profile: No results for input(s): CHOL, HDL, LDLCALC, TRIG, CHOLHDL, LDLDIRECT in the last 72 hours. Thyroid Function Tests: No results for input(s): TSH, T4TOTAL, FREET4, T3FREE, THYROIDAB in the last 72 hours. Anemia Panel: No results for input(s): VITAMINB12, FOLATE, FERRITIN, TIBC, IRON, RETICCTPCT in the last 72 hours. Urine analysis:    Component Value Date/Time   COLORURINE YELLOW  07/29/2017 1456   APPEARANCEUR CLOUDY (A) 07/29/2017 1456   LABSPEC 1.010 07/29/2017 1456   LABSPEC 1.010 08/17/2015 1542   PHURINE 5.5 07/29/2017 1456   GLUCOSEU NEGATIVE 07/29/2017 1456   GLUCOSEU 100 08/17/2015 1542   HGBUR TRACE (  A) 07/29/2017 1456   BILIRUBINUR NEGATIVE 07/29/2017 1456   BILIRUBINUR Negative 08/17/2015 1542   KETONESUR NEGATIVE 07/29/2017 1456   PROTEINUR NEGATIVE 07/29/2017 1456   UROBILINOGEN 1.0 08/26/2015 0725   UROBILINOGEN 0.2 08/17/2015 1542   NITRITE POSITIVE (A) 07/29/2017 1456   LEUKOCYTESUR MODERATE (A) 07/29/2017 1456   LEUKOCYTESUR Trace 08/17/2015 1542   Sepsis Labs: @LABRCNTIP (procalcitonin:4,lacticidven:4) ) Recent Results (from the past 240 hour(s))  Urine Culture     Status: Abnormal   Collection Time: 07/29/17  2:56 PM  Result Value Ref Range Status   Specimen Description URINE, RANDOM  Final   Special Requests NONE  Final   Culture MULTIPLE SPECIES PRESENT, SUGGEST RECOLLECTION (A)  Final   Report Status 07/30/2017 FINAL  Final  C difficile quick scan w PCR reflex     Status: None   Collection Time: 07/31/17 12:40 PM  Result Value Ref Range Status   C Diff antigen NEGATIVE NEGATIVE Final   C Diff toxin NEGATIVE NEGATIVE Final   C Diff interpretation No C. difficile detected.  Final         Radiology Studies: No results found.    Scheduled Meds: . amoxicillin-clavulanate  1 tablet Oral Q12H  . collagenase   Topical Daily  . desmopressin  10 mcg Nasal Daily  . feeding supplement  1 Container Oral TID BM  . folic acid  1 mg Oral Daily  . insulin aspart  0-15 Units Subcutaneous TID WC  . insulin aspart  0-5 Units Subcutaneous QHS  . lacosamide  100 mg Oral BID  . levETIRAcetam  1,000 mg Oral BID  . magnesium chloride  1 tablet Oral Daily  . mirtazapine  7.5 mg Oral QHS  . multivitamin with minerals  1 tablet Oral Daily  . potassium chloride  40 mEq Oral Q4H  . sodium citrate-citric acid  30 mL Oral TID PC & HS    Continuous Infusions: . magnesium sulfate 1 - 4 g bolus IVPB 4 g (08/08/17 0827)  . potassium chloride 10 mEq (08/08/17 0833)     LOS: 46 days    Time spent in minutes: 35    Debbe Odea, MD [facility] Pager: [email or web address] Password [facility] 08/08/2017, 8:38 AM

## 2017-08-08 NOTE — Progress Notes (Signed)
Nutrition Follow-up  DOCUMENTATION CODES:   Obesity unspecified  INTERVENTION:    Continue Boost Breeze po TID, each supplement provides 250 kcal and 9 grams of protein  NUTRITION DIAGNOSIS:   Inadequate oral intake related to altered GI function, chronic illness as evidenced by meal completion < 25%.  Ongoing  GOAL:   Patient will meet greater than or equal to 90% of their needs  Progressing  MONITOR:   PO intake, Supplement acceptance, Diet advancement, Labs, Weight trends, Skin, I & O's  ASSESSMENT:   Lori [name] is a 66 y.o. female with medical history significant of DM2, Seizures (not on medication), HTN, HLD, GERD, cervical and appendiceal carcinoma, intracranial aneurysm repair in 1997 who presents for rectal bleeding.  Patient reports that she is "eating what I want." She completes < 50% of meals. Her son brought her a fast food fish sandwich yesterday and she ate half of it. She likes Boost Breeze supplements, drinking 2-3 supplements per day.  Reviewed menu options with patient. Nutritional services ambassador to visit patient and assist with meal selection.  Labs and medications reviewed.  Diet Order:  Diet - low sodium heart healthy Diet Carb Modified DIET SOFT Room service appropriate? Yes; Fluid consistency: Thin  Skin:  Wound (see comment) (stage II buttocks x2, closed abdominal incision)  Last BM:  9/4  Height:   Ht Readings from Last 1 Encounters:  07/27/17 5\' 8"  (1.727 m)    Weight:   Wt Readings from Last 1 Encounters:  08/08/17 231 lb 7.7 oz (105 kg)    Ideal Body Weight:  65.9 kg  BMI:  Body mass index is 35.2 kg/m.  Estimated Nutritional Needs:   Kcal:  1900-2100  Protein:  115-130 grams  Fluid:  > 1.9 L  EDUCATION NEEDS:   Education needs addressed  Molli Barrows, [place], [place], [place] Pager [phone] After Hours Pager [phone]

## 2017-08-09 DIAGNOSIS — D62 Acute posthemorrhagic anemia: Secondary | ICD-10-CM

## 2017-08-09 DIAGNOSIS — A419 Sepsis, unspecified organism: Secondary | ICD-10-CM

## 2017-08-09 DIAGNOSIS — C539 Malignant neoplasm of cervix uteri, unspecified: Secondary | ICD-10-CM

## 2017-08-09 DIAGNOSIS — E232 Diabetes insipidus: Secondary | ICD-10-CM

## 2017-08-09 DIAGNOSIS — T8130XA Disruption of wound, unspecified, initial encounter: Secondary | ICD-10-CM

## 2017-08-09 DIAGNOSIS — K922 Gastrointestinal hemorrhage, unspecified: Secondary | ICD-10-CM

## 2017-08-09 DIAGNOSIS — I6200 Nontraumatic subdural hemorrhage, unspecified: Secondary | ICD-10-CM

## 2017-08-09 DIAGNOSIS — R6520 Severe sepsis without septic shock: Secondary | ICD-10-CM

## 2017-08-09 DIAGNOSIS — S065X9A Traumatic subdural hemorrhage with loss of consciousness of unspecified duration, initial encounter: Secondary | ICD-10-CM

## 2017-08-09 DIAGNOSIS — G40909 Epilepsy, unspecified, not intractable, without status epilepticus: Secondary | ICD-10-CM

## 2017-08-09 DIAGNOSIS — S065XAA Traumatic subdural hemorrhage with loss of consciousness status unknown, initial encounter: Secondary | ICD-10-CM

## 2017-08-09 LAB — GLUCOSE, CAPILLARY
GLUCOSE-CAPILLARY: 87 mg/dL (ref 65–99)
GLUCOSE-CAPILLARY: 73 mg/dL (ref 65–99)
Glucose-Capillary: 124 mg/dL — ABNORMAL HIGH (ref 65–99)
Glucose-Capillary: 84 mg/dL (ref 65–99)

## 2017-08-09 LAB — CBC
HCT: 25.4 % — ABNORMAL LOW (ref 36.0–46.0)
Hemoglobin: 7.9 g/dL — ABNORMAL LOW (ref 12.0–15.0)
MCH: 28.2 pg (ref 26.0–34.0)
MCHC: 31.1 g/dL (ref 30.0–36.0)
MCV: 90.7 fL (ref 78.0–100.0)
Platelets: 359 10*3/uL (ref 150–400)
RBC: 2.8 MIL/uL — ABNORMAL LOW (ref 3.87–5.11)
RDW: 16.1 % — AB (ref 11.5–15.5)
WBC: 11.3 10*3/uL — ABNORMAL HIGH (ref 4.0–10.5)

## 2017-08-09 LAB — BASIC METABOLIC PANEL
Anion gap: 4 — ABNORMAL LOW (ref 5–15)
BUN: <5 mg/dL — ABNORMAL LOW (ref 6–20)
CALCIUM: 7.2 mg/dL — AB (ref 8.9–10.3)
CHLORIDE: 115 mmol/L — AB (ref 101–111)
CO2: 25 mmol/L (ref 22–32)
CREATININE: 0.63 mg/dL (ref 0.44–1.00)
Glucose, Bld: 90 mg/dL (ref 65–99)
Potassium: 4.2 mmol/L (ref 3.5–5.1)
Sodium: 144 mmol/L (ref 135–145)

## 2017-08-09 LAB — MAGNESIUM: MAGNESIUM: 1.9 mg/dL (ref 1.7–2.4)

## 2017-08-09 MED ORDER — MAGNESIUM CHLORIDE 64 MG PO TBEC: 1.0000 | DELAYED_RELEASE_TABLET | Freq: Every day | ORAL | Status: AC

## 2017-08-09 MED ORDER — METOPROLOL TARTRATE 25 MG PO TABS
25.0000 mg | ORAL_TABLET | Freq: Two times a day (BID) | ORAL | Status: DC
  Administered 2017-08-09 – 2017-08-10 (×3): 25 mg via ORAL
  Filled 2017-08-09 (×3): qty 1

## 2017-08-09 MED ORDER — BOOST / RESOURCE BREEZE PO LIQD: 1.0000 | Freq: Three times a day (TID) | ORAL | 0 refills | Status: AC

## 2017-08-09 MED ORDER — LACOSAMIDE 100 MG PO TABS: 100.0000 mg | ORAL_TABLET | Freq: Two times a day (BID) | ORAL | Status: AC

## 2017-08-09 MED ORDER — SOD CITRATE-CITRIC ACID 500-334 MG/5ML PO SOLN: 30.0000 mL | Freq: Three times a day (TID) | ORAL | 0 refills | Status: AC

## 2017-08-09 MED ORDER — INSULIN ASPART 100 UNIT/ML ~~LOC~~ SOLN: 0.0000 [IU] | Freq: Three times a day (TID) | SUBCUTANEOUS | 11 refills | Status: AC

## 2017-08-09 MED ORDER — DESMOPRESSIN ACETATE SPRAY 0.01 % NA SOLN: 10.0000 ug | Freq: Every day | NASAL | 12 refills | Status: AC

## 2017-08-09 MED ORDER — LEVETIRACETAM 1000 MG PO TABS: 1000.0000 mg | ORAL_TABLET | Freq: Two times a day (BID) | ORAL | Status: AC

## 2017-08-09 MED ORDER — COLLAGENASE 250 UNIT/GM EX OINT: TOPICAL_OINTMENT | Freq: Every day | CUTANEOUS | 0 refills | Status: AC

## 2017-08-09 NOTE — Discharge Summary (Signed)
Physician Discharge Summary  [name] PFX:[id number] DOB: [birth year] DOA: 06/23/2017  PCP: [name], MD  Admit date: 06/23/2017 Discharge date: 08/09/2017  Admitted From: SNF Disposition:  SNF   Recommendations for Outpatient Follow-up:  Poor prognosis- please have palliative care follow Please check Hb 2 x week and send to transfusion center rather than to ER for transfusions Check Bmet and Mg 2 x week and replace K and Mg as needed Foley and rectal tube are being left in due to wound on sacrum Prevalon boots     Discharge Condition:  fair CODE STATUS:  Full code   Consultations: PCCM General surgery Neurology  Oncology Palliative care   Discharge Diagnoses:  Principal Problem:   GI bleed Active Problems:   Cerebrovascular accident (CVA) due to thrombosis of precerebral artery ([place])   Severe sepsis ([place])   Anemia associated with acute blood loss   Seizure disorder ([place])   Subdural hematoma ([place])   Diabetes insipidus ([place])   Cervical cancer, 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 Sitting up eating (feeding herself) and talking. Has been told that she will d/c to SNF today. She has no questions.   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 She was recently hospitalized  for small bowel obstruction and underwent diagnostic laparoscopy in June. She underwent repair of small bowel perforation. She had a prolonged hospital stay. Subsequently was discharged to skilled nursing facility.   Presented back with rectal bleeding. Seen by oncology and GI. Not thought to be a candidate for any treatment.  She had what appears to be a seizure resulting in encephalopathy on 7/26. Neurology was consulted and started her on AEDs. Encephalopathy eventually improved.  Underwent MBS, started on dysphagia 2 diet however due to ongoingl poor oral intake NGT was continued for a while longer. It has since been removed and she is tolerating food.    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 [name]: 1. 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  2. Exploratory laparotomy, drainage of abdominal abscess, repair of small bowel perforation, repair of small bowel enterotomy, enterocolostomy (mid ileum to mid transverse colon). - current open abdominal wound>>> per gen surgery "local wound care, can continue some debridement but this is unlikely to ever heal at this point" - wound care for open abd wound: Cleanse wound with NS, pat gently dry. Fill defect with saline moistened Kerlix roll gauze to skin level, top with dry gauze 4x4s and ABD.  Secure with paper tape. .   Severe sepsis secondary to pneumatosis and bowel ischemia -Placed on vancomycin and Zosyn initially however vancomycin was discontinued as there is no evidence of  MRSA -Patient still not candidate for surgery per general surgery -Blood cultures show no growth to date   - has been on antibiotics since 7/21-   Augmentin stopped on  9/5- WBC 10   Hypokalemia/ hypomag - replace aggressively  GI bleed with acute blood loss anemia - GI has not scoped her because of her co-morbidities and has signed off - she has had diarrhea and has been c diff negative - orange water noted in rectal tube has not resolved with Lomotil- follow - Hb stable at 7.9- she is asymptomatic  Seizure / CVA and bilateral subdural Hematomas -Patient was noted to have garbled speech on the afternoon of 06/28/2017  -Neurology consultation appreciated -CT head on 8/1: Evolving encephalomalacia at suspected left thalamic lacunar infarct. Previously identified tiny bilateral subdural hematoma -MRI brain on 7/29: New tiny bilateral subdural hematomas, trace subarachnoid hemorrhage, punctate focus of diffusion abnormality in the dorsal thalamus -It was thought the patient was having seizure activity and she was loaded with Keppra and then Vimpat - continuous EEG monitoring: Progressively improving diffuse encephalopathy    Diabetes insipidus/polyuria/Hypernatremia  - desmopressin is helping - U output < 2 L a day- sodium stable  DM2 - SSI  Stage II pressure ulcer of the buttocks -Continue wound care: Cleanse with NS, pat gently dry. Cover necrotic tissue with 1/8 inch thick layer of collagenase (Santyl).  Top with saline moistened single gauze 4x4 (opened), top with additional dry gauze dressings, secure with medipore tape.  Turn and reposition patient from side to side and minimize time spent in the supine position.  Use ONE underpad beneath patient and no pads between legs.   Essential hypertension - Metoprolol  Enterococcus UTI -resolved, treated with unasyn   Discharge Instructions  Discharge Instructions    Diet - low sodium heart healthy    Complete by:  As directed    Diet Carb Modified    Complete by:  As directed    Discharge instructions    Complete by:  As directed    Patient will benefit from Palliative care  evaluation in SNF Fall precautions Continue NG feeding and encourage oral feeding as well.  Dressing changes as per General surgery recommendations   Increase activity slowly    Complete by:  As directed      Allergies as of 08/09/2017      Reactions   Contrast Media [iodinated Diagnostic Agents] Itching, Swelling   Dilantin [phenytoin Sodium Extended] Itching, Swelling, Other (See Comments)   Whole body   Latex Hives, Itching   Adhesive [tape] Rash      Medication List    STOP taking these medications   alum & mag hydroxide-simeth 200-200-20 MG/5ML suspension Commonly known as:  MAALOX/MYLANTA   aspirin EC 81 MG tablet   calcium carbonate 500 MG chewable tablet Commonly known as:  TUMS - dosed in mg elemental calcium   cefUROXime 500 MG tablet Commonly known as:  CEFTIN   magnesium oxide 400 (241.3 Mg) MG tablet Commonly known as:  MAG-OX   multivitamin with minerals Tabs tablet   vitamin C 500 MG tablet Commonly known as:  ASCORBIC ACID     TAKE these medications   acetaminophen 325 MG tablet Commonly known as:  TYLENOL Take 650 mg by mouth every 6 (six) hours as needed (pain). What changed:  Another medication with the same name was removed. Continue taking this medication, and follow the directions you see here.   cholecalciferol 1000 units tablet Commonly  known as:  VITAMIN D Take 1,000 Units by mouth daily.   cholecalciferol 400 units Tabs tablet Commonly known as:  VITAMIN D Take 400 Units by mouth 2 (two) times daily.   cloNIDine 0.1 mg/24hr patch Commonly known as:  CATAPRES - Dosed in mg/24 hr Place 1 patch (0.1 mg total) onto the skin once a week.   collagenase ointment Commonly known as:  SANTYL Apply topically daily.   desmopressin 0.01 % solution Commonly known as:  DDAVP NASAL Place 1 spray (10 mcg total) into the nose daily.   famotidine 20 MG tablet Commonly known as:  PEPCID Take 1 tablet (20 mg total) by mouth daily.   feeding  supplement (ENSURE ENLIVE) Liqd Take 237 mLs by mouth 3 (three) times daily between meals. What changed:  when to take this   feeding supplement Liqd Take 1 Container by mouth 3 (three) times daily between meals. What changed:  when to take this   feeding supplement (PRO-STAT SUGAR FREE 64) Liqd Place 30 mLs into feeding tube 2 (two) times daily.   folic acid 1 MG tablet Commonly known as:  FOLVITE Place 1 tablet (1 mg total) into feeding tube daily.   insulin aspart 100 UNIT/ML injection Commonly known as:  novoLOG Inject 0-15 Units into the skin 3 (three) times daily with meals. What changed:  how much to take  when to take this  additional instructions   Lacosamide 100 MG Tabs Take 1 tablet (100 mg total) by mouth 2 (two) times daily.   levETIRAcetam 1000 MG tablet Commonly known as:  KEPPRA Take 1 tablet (1,000 mg total) by mouth 2 (two) times daily.   loperamide 2 MG capsule Commonly known as:  IMODIUM Take 1 capsule (2 mg total) by mouth as needed for diarrhea or loose stools.   magnesium chloride 64 MG Tbec SR tablet Commonly known as:  SLOW-MAG Take 1 tablet (64 mg total) by mouth daily.   metoprolol tartrate 25 MG tablet Commonly known as:  LOPRESSOR Take 25 mg by mouth 2 (two) times daily.   mirtazapine 7.5 MG tablet Commonly known as:  REMERON Take 1 tablet (7.5 mg total) by mouth at bedtime.   multivitamin Liqd Place 15 mLs into feeding tube daily.   ondansetron 4 MG tablet Commonly known as:  ZOFRAN Take 1 tablet (4 mg total) by mouth every 6 (six) hours as needed for nausea.   potassium chloride SA 20 MEQ tablet Commonly known as:  K-DUR,KLOR-CON Take 20 mEq by mouth daily.   pravastatin 20 MG tablet Commonly known as:  PRAVACHOL Take 20 mg by mouth at bedtime.   sodium citrate-citric acid 500-334 MG/5ML solution Commonly known as:  ORACIT Take 30 mLs by mouth 4 (four) times daily - after meals and at bedtime.   zinc gluconate 50 MG  tablet Take 50 mg by mouth See admin instructions. Take 1 tablet (50 mg) by mouth daily for 14 days for wound healing - stop date 07/05/17            Discharge Care Instructions        Start     Ordered   08/09/17 0000  collagenase (SANTYL) ointment  Daily     08/09/17 0829   08/09/17 0000  desmopressin (DDAVP NASAL) 0.01 % solution  Daily     08/09/17 0829   08/09/17 0000  Discharge instructions    Comments:  Patient will benefit from Palliative care evaluation in SNF Fall precautions Continue NG  feeding and encourage oral feeding as well.  Dressing changes as per General surgery recommendations   08/09/17 0829   08/09/17 0000  feeding supplement (BOOST / RESOURCE BREEZE) LIQD  3 times daily between meals     08/09/17 0829   08/09/17 0000  lacosamide 100 MG TABS  2 times daily     08/09/17 0829   08/09/17 0000  levETIRAcetam (KEPPRA) 1000 MG tablet  2 times daily     08/09/17 0829   08/09/17 0000  magnesium chloride (SLOW-MAG) 64 MG TBEC SR tablet  Daily     08/09/17 0829   08/09/17 0000  sodium citrate-citric acid (ORACIT) 500-334 MG/5ML solution  3 times daily after meals & bedtime     08/09/17 0829   08/09/17 0000  insulin aspart (NOVOLOG) 100 UNIT/ML injection  3 times daily with meals     08/09/17 1234   07/25/17 0000  cloNIDine (CATAPRES - DOSED IN MG/24 HR) 0.1 mg/24hr patch  Weekly     07/21/17 1106   07/22/17 0000  Multiple Vitamin (MULTIVITAMIN) LIQD  Daily     07/21/17 1106   16/10/96 0454  folic acid (FOLVITE) 1 MG tablet  Daily     07/21/17 1106   07/21/17 0000  ondansetron (ZOFRAN) 4 MG tablet  Every 6 hours PRN     07/21/17 1106   07/21/17 0000  mirtazapine (REMERON) 7.5 MG tablet  Daily at bedtime     07/21/17 1106   07/21/17 0000  loperamide (IMODIUM) 2 MG capsule  As needed     07/21/17 1106   07/21/17 0000  feeding supplement, ENSURE ENLIVE, (ENSURE ENLIVE) LIQD  3 times daily between meals     07/21/17 1106   07/21/17 0000  Amino Acids-Protein  Hydrolys (FEEDING SUPPLEMENT, PRO-STAT SUGAR FREE 64,) LIQD  2 times daily     07/21/17 1106   07/21/17 0000  Diet - low sodium heart healthy     07/21/17 1106   07/21/17 0000  Increase activity slowly     07/21/17 1106   07/21/17 0000  Diet Carb Modified     07/21/17 1106      Contact information for follow-up providers    Jackolyn Confer, MD Follow up in 1 week(s).   Specialty:  General Surgery Contact information: [address] [place] [address] [phone]        Twana First, MD Follow up in 2 week(s).   Specialty:  Oncology Contact information: [place] [address] [phone]        [facility] Follow up in 1 week(s).   Specialty:  Hospice and Palliative Medicine Contact information: [place] [address] [facility] Follow up in 2 week(s).   Specialty:  Neurology Contact information: [place], [address] [place] [address] [phone]           Contact information for after-discharge care    [facility] .   Specialty:  [place] information: [address] [place] [phone]                 Allergies  Allergen Reactions  . Contrast Media [Iodinated Diagnostic Agents] Itching and Swelling  . Dilantin [Phenytoin Sodium Extended] Itching, Swelling and Other (See Comments)    Whole body  . Latex Hives and Itching  .  Adhesive [Tape] Rash     Procedures/Studies:    Ct Abdomen Pelvis Wo Contrast  Addendum Date: 07/21/2017   ADDENDUM REPORT: 07/21/2017 20:51 ADDENDUM: Critical Value/emergent results were called by telephone at the time of interpretation on 07/21/2017 at 8:51 pm to Lovey Newcomer, NP, who verbally acknowledged these results and communicated to the surgical team involved. Electronically Signed   By: Inez Catalina  M.D.   On: 07/21/2017 20:51   Result Date: 07/21/2017 CLINICAL DATA:  Abdominal pain and fevers EXAM: CT CHEST, ABDOMEN AND PELVIS WITHOUT CONTRAST TECHNIQUE: Multidetector CT imaging of the chest, abdomen and pelvis was performed following the standard protocol without IV contrast. COMPARISON:  05/23/2017, 02/02/2017 FINDINGS: CT CHEST FINDINGS Cardiovascular: Atherosclerotic calcifications are noted without aneurysmal dilatation. Coronary calcifications are seen. No significant cardiac enlargement is noted. No significant pulmonary arterial enlargement is seen. Right chest wall port is noted. Mediastinum/Nodes: Thoracic inlet is within normal limits. There is a feeding catheter within the esophagus which extends into the proximal duodenum. No significant hilar or mediastinal adenopathy is identified at this time. Lungs/Pleura: The lungs are well aerated bilaterally. A small right-sided pleural effusion is noted with right lower lobe atelectasis. Scattered nodular changes are noted throughout both lungs. A small 5 mm nodule is noted in the left apex best seen on image number 30 of series 4. This was not well appreciated on the prior CT from June of 2018. A a small nodule seen in the right upper lobe measuring 3 mm on the prior exam has enlarged in size now measuring approximately 6 mm. The previously seen medial right upper lobe nodule is no longer appreciated and may have been postinflammatory in nature. No other significant nodules are seen. Musculoskeletal: No acute bony abnormality is noted. Degenerative changes of the thoracic spine are seen. CT ABDOMEN PELVIS FINDINGS Hepatobiliary: The liver and gallbladder appear within normal limits. Pancreas: Unremarkable. No pancreatic ductal dilatation or surrounding inflammatory changes. Spleen: Normal in size without focal abnormality. Adrenals/Urinary Tract: The adrenal glands are unremarkable. The kidneys demonstrate no renal calculi or obstructive changes. The  bladder is partially decompressed by Foley catheter. Stomach/Bowel: Air and fluid filled colon is noted. There are multiple abnormal small bowel loops noted in the mid and left abdomen with evidence of date pneumatosis. Some air is tracking into the small bowel does not area although no definitive portal venous gas is noted at this time. No definitive free air to suggest perforation is noted. Large anterior abdominal wound is noted. Rectal tube is noted. The appendix is not well visualized and may have been surgically removed. Vascular/Lymphatic: Aortic atherosclerosis. No enlarged abdominal or pelvic lymph nodes. Reproductive: Uterus is partially visualized and show some fluid within the endometrial canal. This is somewhat unusual for patient of this age. There is fullness in the region of the right and axilla. Which appeared to be present on the prior exam. Other: Minimal free fluid is noted. Some generalized edema is noted throughout the mesenteric structures likely related to the ischemic change in the small bowel. Musculoskeletal: Degenerative changes of lumbar spine without acute abnormality. IMPRESSION: There are multiple loops of small bowel showing significant pneumatosis most consistent with bowel ischemia. The lack of IV contrast precludes full evaluation of the VAC vasculature in this region. Air is noted tracking along the branches of the superior mesenteric vein although no definitive intraluminal portal venous air is seen at this time. No definitive changes of perforation are noted. Mild free fluid is noted as  well as some edema throughout the mesenteric likely reactive in nature. No definitive abscess is seen. Fullness in the region of the right adnexae is noted consistent with some cystic changes seen on prior PET-CT. Some of the free fluid is also localized in this area although again no definitive abscess is noted. Right lower lobe atelectasis and small right-sided pleural effusion. Increase in  nodular densities in both lungs suspicious for progressive metastatic disease although a sizable nodule seen on prior PET-CT has resolved and may have simply been postinflammatory in nature. Continued follow-up of these nodules is recommended. These results will be called to the ordering clinician or representative by the Radiologist Assistant, and communication documented in the PACS or zVision Dashboard. Electronically Signed: By: Inez Catalina M.D. On: 07/21/2017 19:57   Ct Head Wo Contrast  Result Date: 07/21/2017 CLINICAL DATA:  Altered mental status EXAM: CT HEAD WITHOUT CONTRAST TECHNIQUE: Contiguous axial images were obtained from the base of the skull through the vertex without intravenous contrast. COMPARISON:  07/04/2017, 07/01/2017 MRI, CT head 06/28/2017, 06/17/2017, PET-CT 02/02/2017 FINDINGS: Brain: No acute territorial infarction, new hemorrhage or intracranial mass is seen. Old encephalomalacia of the left cerebellar hemisphere. Old encephalomalacia of the right frontal lobe with coarse calcification. Moderate atrophy. Small vessel ischemic changes of the white matter. Old lacunar infarcts in the left white matter, basal ganglia and thalamus. Stable slightly enlarged ventricles likely due to atrophy. Vascular: No hyperdense vessels.  Carotid artery calcifications. Skull: Left sub occipital craniectomy. Old right frontal burr hole. No acute fracture. Incomplete fusion of anterior arch of C1. Sinuses/Orbits: Small osteoma in the left frontal ethmoidal air cells. Minimal mucosal thickening. No acute orbital abnormality. Other: Chronic anterior subluxation of the mandibular heads. IMPRESSION: 1. No definite CT evidence for acute intracranial abnormality. 2. Old encephalomalacia in the left cerebellum and right frontal lobe as previously described. Atrophy and small vessel ischemic changes of the white matter. Electronically Signed   By: Donavan Foil M.D.   On: 07/21/2017 19:47   Ct Chest Wo  Contrast  Addendum Date: 07/21/2017   ADDENDUM REPORT: 07/21/2017 20:51 ADDENDUM: Critical Value/emergent results were called by telephone at the time of interpretation on 07/21/2017 at 8:51 pm to Lovey Newcomer, NP, who verbally acknowledged these results and communicated to the surgical team involved. Electronically Signed   By: Inez Catalina M.D.   On: 07/21/2017 20:51   Result Date: 07/21/2017 CLINICAL DATA:  Abdominal pain and fevers EXAM: CT CHEST, ABDOMEN AND PELVIS WITHOUT CONTRAST TECHNIQUE: Multidetector CT imaging of the chest, abdomen and pelvis was performed following the standard protocol without IV contrast. COMPARISON:  05/23/2017, 02/02/2017 FINDINGS: CT CHEST FINDINGS Cardiovascular: Atherosclerotic calcifications are noted without aneurysmal dilatation. Coronary calcifications are seen. No significant cardiac enlargement is noted. No significant pulmonary arterial enlargement is seen. Right chest wall port is noted. Mediastinum/Nodes: Thoracic inlet is within normal limits. There is a feeding catheter within the esophagus which extends into the proximal duodenum. No significant hilar or mediastinal adenopathy is identified at this time. Lungs/Pleura: The lungs are well aerated bilaterally. A small right-sided pleural effusion is noted with right lower lobe atelectasis. Scattered nodular changes are noted throughout both lungs. A small 5 mm nodule is noted in the left apex best seen on image number 30 of series 4. This was not well appreciated on the prior CT from June of 2018. A a small nodule seen in the right upper lobe measuring 3 mm on the prior exam has enlarged in  size now measuring approximately 6 mm. The previously seen medial right upper lobe nodule is no longer appreciated and may have been postinflammatory in nature. No other significant nodules are seen. Musculoskeletal: No acute bony abnormality is noted. Degenerative changes of the thoracic spine are seen. CT ABDOMEN PELVIS FINDINGS  Hepatobiliary: The liver and gallbladder appear within normal limits. Pancreas: Unremarkable. No pancreatic ductal dilatation or surrounding inflammatory changes. Spleen: Normal in size without focal abnormality. Adrenals/Urinary Tract: The adrenal glands are unremarkable. The kidneys demonstrate no renal calculi or obstructive changes. The bladder is partially decompressed by Foley catheter. Stomach/Bowel: Air and fluid filled colon is noted. There are multiple abnormal small bowel loops noted in the mid and left abdomen with evidence of date pneumatosis. Some air is tracking into the small bowel does not area although no definitive portal venous gas is noted at this time. No definitive free air to suggest perforation is noted. Large anterior abdominal wound is noted. Rectal tube is noted. The appendix is not well visualized and may have been surgically removed. Vascular/Lymphatic: Aortic atherosclerosis. No enlarged abdominal or pelvic lymph nodes. Reproductive: Uterus is partially visualized and show some fluid within the endometrial canal. This is somewhat unusual for patient of this age. There is fullness in the region of the right and axilla. Which appeared to be present on the prior exam. Other: Minimal free fluid is noted. Some generalized edema is noted throughout the mesenteric structures likely related to the ischemic change in the small bowel. Musculoskeletal: Degenerative changes of lumbar spine without acute abnormality. IMPRESSION: There are multiple loops of small bowel showing significant pneumatosis most consistent with bowel ischemia. The lack of IV contrast precludes full evaluation of the VAC vasculature in this region. Air is noted tracking along the branches of the superior mesenteric vein although no definitive intraluminal portal venous air is seen at this time. No definitive changes of perforation are noted. Mild free fluid is noted as well as some edema throughout the mesenteric likely  reactive in nature. No definitive abscess is seen. Fullness in the region of the right adnexae is noted consistent with some cystic changes seen on prior PET-CT. Some of the free fluid is also localized in this area although again no definitive abscess is noted. Right lower lobe atelectasis and small right-sided pleural effusion. Increase in nodular densities in both lungs suspicious for progressive metastatic disease although a sizable nodule seen on prior PET-CT has resolved and may have simply been postinflammatory in nature. Continued follow-up of these nodules is recommended. These results will be called to the ordering clinician or representative by the Radiologist Assistant, and communication documented in the PACS or zVision Dashboard. Electronically Signed: By: Inez Catalina M.D. On: 07/21/2017 19:57   Dg Chest Port 1 View  Result Date: 07/29/2017 CLINICAL DATA:  Leukocytosis. EXAM: PORTABLE CHEST 1 VIEW COMPARISON:  07/25/2017 and CT 07/21/2017 FINDINGS: Right jugular Port-A-Cath is present. Catheter tip is near the superior cavoatrial junction. Again noted is mild elevation of the right hemidiaphragm. Persistent hazy densities at the right lung base and cannot exclude a small right pleural effusion. Upper lungs are clear. Heart size is slightly enlarged but stable. Negative for a pneumothorax. IMPRESSION: Persistent subtle densities at right lung base. Findings may represent volume loss and small effusion. No significant change from the recent comparison examination. Electronically Signed   By: Markus Daft M.D.   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 Laparotomy for intra-abdominal abscess drainage on May 30, 2017, previous CVA, cervical malignancy, former smoker. EXAM: PORTABLE CHEST 1 VIEW COMPARISON:  Portable chest x-ray of July 23, 2017 FINDINGS: The right lung is  mildly hypoinflated with the left lung better inflated. There remains increased interstitial density in the right mid and lower lung. There is no large pleural effusion. The cardiac silhouette is enlarged. The pulmonary vascularity is not engorged. There is calcification in the wall of the aortic arch. The Port-A-Cath tip projects over the distal third of the SVC. The nasogastric tube has been removed. IMPRESSION: Fairly stable appearance of the chest. Right basilar atelectasis or pneumonia. Mild cardiomegaly and central pulmonary vascular congestion without definite pulmonary edema. Electronically Signed   By: David  Martinique M.D.   On: 07/25/2017 07:50   Dg Chest Port 1 View  Result Date: 07/23/2017 CLINICAL DATA:  Dyspnea, quite weak today, status post laparotomy in June 2018 for drainage of an intra-abdominal small bowel abscess secondary to perforation. EXAM: PORTABLE CHEST 1 VIEW COMPARISON:  Chest x-ray of July 21, 2017 and chest CT scan of the same day FINDINGS: The lungs are better inflated today. There remains increased density at the right lung base consistent with atelectasis and small effusion. There is no pneumothorax. The cardiac silhouette remains mildly enlarged. The central pulmonary vascularity is prominent. There is mild interstitial edema. The feeding tube has been removed and a nasogastric tube placed whose proximal port is at or just below the GE junction. The porta catheter tip projects over the distal third of the SVC. IMPRESSION: Persistent right basilar atelectasis or pneumonia with small right pleural effusion. Mild interstitial edema bilaterally. Advancement of the nasogastric tube by 5-10 cm would assure that the proximal port remains positioned below the GE junction. Electronically Signed   By: David  Martinique M.D.   On: 07/23/2017 08:03   Dg Chest Port 1 View  Result Date: 07/21/2017 CLINICAL DATA:  Sepsis EXAM: PORTABLE CHEST 1 VIEW COMPARISON:  June 11, 2017 FINDINGS: Central  catheter tip is at the cavoatrial junction. Nasogastric tube tip is in the distal stomach. No pneumothorax. There is bibasilar atelectasis. Ill-defined opacity left lower lobe may represent superimposed pneumonia. Heart is mildly enlarged with pulmonary vascularity within normal limits. No adenopathy. No bone lesions. IMPRESSION: Tube and catheter positions as described without pneumothorax. Bibasilar atelectasis. Suspect superimposed pneumonia left base. Stable cardiac prominence. Electronically Signed   By: Lowella Grip III M.D.   On: 07/21/2017 16:40   Dg Abd Portable 1v  Result Date: 07/22/2017 CLINICAL DATA:  Nasogastric tube placement.  Initial encounter. EXAM: PORTABLE ABDOMEN - 1 VIEW COMPARISON:  CT of the abdomen and pelvis performed 07/21/2018 FINDINGS: The patient's enteric tube is noted ending overlying the body of the stomach, with the side port about the gastroesophageal junction. Diffuse pneumatosis is noted throughout the visualized upper abdomen, as seen on CT, raising concern for bowel ischemia. No acute osseous abnormalities are seen. The visualized lung bases are grossly clear. IMPRESSION: 1. Enteric tube noted ending overlying the body of the stomach, with the side port about the gastroesophageal junction. 2. Diffuse small bowel pneumatosis throughout the visualized upper abdomen, as seen on CT, raising concern for bowel ischemia. Electronically Signed   By: Garald Balding M.D.   On: 07/22/2017 00:11       Discharge Exam: Vitals:   08/09/17 0000 08/09/17 0610  BP: (!) 142/89 (!) 145/93  Pulse: Marland Kitchen)  109   Resp: 19   Temp: 99.3 F (37.4 C) 99.6 F (37.6 C)  SpO2: 99% 98%   Vitals:   08/08/17 1900 08/08/17 2000 08/09/17 0000 08/09/17 0610  BP:  (!) 154/98 (!) 142/89 (!) 145/93  Pulse: (!) 106 97 (!) 109   Resp: (!) 21 (!) 21 19   Temp:   99.3 F (37.4 C) 99.6 F (37.6 C)  TempSrc:    Oral  SpO2: 98% 100% 99% 98%  Weight:      Height:        General: Pt is  alert, awake, not in acute distress Cardiovascular: RRR, S1/S2 +, no rubs, no gallops Respiratory: CTA bilaterally, no wheezing, no rhonchi Abdominal: Soft, NT, ND, bowel sounds + Extremities: no edema, no cyanosis    The results of significant diagnostics from this hospitalization (including imaging, microbiology, ancillary and laboratory) are listed below for reference.     Microbiology: Recent Results (from the past 240 hour(s))  C difficile quick scan w PCR reflex     Status: None   Collection Time: 07/31/17 12:40 PM  Result Value Ref Range Status   C Diff antigen NEGATIVE NEGATIVE Final   C Diff toxin NEGATIVE NEGATIVE Final   C Diff interpretation No C. difficile detected.  Final     Labs: BNP (last 3 results) No results for input(s): BNP in the last 8760 hours. Basic Metabolic Panel:  Recent Labs Lab 08/05/17 1736 08/06/17 0516 08/07/17 0436 08/08/17 0414 08/09/17 0442  NA 150* 150* 146* 146* 144  K 3.0* 2.6* 2.6* 2.9* 4.2  CL 120* 119* 117* 117* 115*  CO2 22 23 23 25 25   GLUCOSE 91 80 142* 105* 90  BUN 11 9 5* <5* <5*  CREATININE 1.01* 0.97 0.80 0.72 0.63  CALCIUM 7.5* 7.3* 6.9* 7.1* 7.2*  MG  --  1.3* 1.5* 1.6* 1.9   Liver Function Tests: No results for input(s): AST, ALT, ALKPHOS, BILITOT, PROT, ALBUMIN in the last 168 hours. No results for input(s): LIPASE, AMYLASE in the last 168 hours. No results for input(s): AMMONIA in the last 168 hours. CBC:  Recent Labs Lab 08/03/17 0617 08/03/17 0856 08/04/17 0415 08/05/17 0414 08/08/17 0414 08/09/17 1048  WBC 14.8*  --  12.8* 11.3* 10.5 11.3*  NEUTROABS  --   --  10.8* 9.4* 8.8*  --   HGB 8.5* 8.6* 8.4* 8.6* 7.9* 7.9*  HCT 26.5* 26.4* 25.3* 26.0* 24.5* 25.4*  MCV 87.7  --  86.6 86.7 89.4 90.7  PLT 196  --  231 261 329 359   Cardiac Enzymes: No results for input(s): CKTOTAL, CKMB, CKMBINDEX, TROPONINI in the last 168 hours. BNP: Invalid input(s): POCBNP CBG:  Recent Labs Lab 08/08/17 1113  08/08/17 1647 08/08/17 2057 08/09/17 0659 08/09/17 1210  GLUCAP 85 160* 111* 87 73   D-Dimer No results for input(s): DDIMER in the last 72 hours. Hgb A1c No results for input(s): HGBA1C in the last 72 hours. Lipid Profile No results for input(s): CHOL, HDL, LDLCALC, TRIG, CHOLHDL, LDLDIRECT in the last 72 hours. Thyroid function studies No results for input(s): TSH, T4TOTAL, T3FREE, THYROIDAB in the last 72 hours.  Invalid input(s): FREET3 Anemia work up No results for input(s): VITAMINB12, FOLATE, FERRITIN, TIBC, IRON, RETICCTPCT in the last 72 hours. Urinalysis    Component Value Date/Time   COLORURINE YELLOW 07/29/2017 1456   APPEARANCEUR CLOUDY (A) 07/29/2017 1456   LABSPEC 1.010 07/29/2017 1456   LABSPEC 1.010 08/17/2015 1542   PHURINE  5.5 07/29/2017 1456   GLUCOSEU NEGATIVE 07/29/2017 1456   GLUCOSEU 100 08/17/2015 1542   HGBUR TRACE (A) 07/29/2017 1456   BILIRUBINUR NEGATIVE 07/29/2017 1456   BILIRUBINUR Negative 08/17/2015 1542   KETONESUR NEGATIVE 07/29/2017 1456   PROTEINUR NEGATIVE 07/29/2017 1456   UROBILINOGEN 1.0 08/26/2015 0725   UROBILINOGEN 0.2 08/17/2015 1542   NITRITE POSITIVE (A) 07/29/2017 1456   LEUKOCYTESUR MODERATE (A) 07/29/2017 1456   LEUKOCYTESUR Trace 08/17/2015 1542   Sepsis Labs Invalid input(s): PROCALCITONIN,  WBC,  LACTICIDVEN Microbiology Recent Results (from the past 240 hour(s))  C difficile quick scan w PCR reflex     Status: None   Collection Time: 07/31/17 12:40 PM  Result Value Ref Range Status   C Diff antigen NEGATIVE NEGATIVE Final   C Diff toxin NEGATIVE NEGATIVE Final   C Diff interpretation No C. difficile detected.  Final     Time coordinating discharge: Over 30 minutes  SIGNED:   Debbe Odea, MD  [facility] 08/09/2017, 12:35 PM Pager   If 7PM-7AM, please contact night-coverage [email or web address] Password [email or web address]

## 2017-08-09 NOTE — Care Management Note (Signed)
Case Management Note Previous CM note initiated by Maryclare Labrador, RN 07/26/2017, 9:01 AM  Patient Details  Name: Lori [name] MRN: [id number] Date of Birth: 08-30-1951  Subjective/Objective:     Pt admitted with Blood in Stools - pt found to have ischemic bowel                Action/Plan:   PTA from home.  Pt is being recommended for SNF - CSW following.     Expected Discharge Date:                 Expected Discharge Plan:  Skilled Nursing Facility  In-House Referral:  Clinical Social Work  Discharge planning Services  CM Consult  Post Acute Care Choice:  NA Choice offered to:     DME Arranged:    DME Agency:     HH Arranged:    [place] Agency:     Status of Service:  Completed, signed off  If discussed at [employer], dates discussed:  9/6  Discharge Disposition:   Additional Comments:   08/09/17- 1020- Marvetta Gibbons RN CM- PC consulted however pt's family not interested in [place] at this time and declined consult- continue to want aggressive treatment- CSW continues to follow for SNF - [place] when pt medically stable for discharge.   Maryclare Labrador, RN 07/26/2017, 9:01 AM--- Pt transferred to SD from floor.  Pt was discussed in LOS 8/23 - pt remains appropriate for continued stay - barrier to discharge is nutriiton ; NG tube removed to allow for appetite encouragement however pt currently only tolerating ice chips.  Palliative involved - family continues to want aggressive measures.   Dahlia Client Blaine, RN 08/09/2017, 10:21 AM [phone]

## 2017-08-09 NOTE — Progress Notes (Signed)
Physical Therapy Wound Treatment Patient Details  Name: Lori [name] MRN: [id number] Date of Birth: 03/23/1951  Today's Date: 08/09/2017 Time: 7680-8811 Time Calculation (min): 35 min  Subjective  Subjective: Pt pleasant and agreeable to therapy Patient and Family Stated Goals: Heal wound  Pain Score:  Pt did not indicate pain during treatment  Wound Assessment  Pressure Injury 07/14/17 Unstageable - Full thickness tissue loss in which the base of the ulcer is covered by slough (yellow, tan, gray, green or brown) and/or eschar (tan, brown or black) in the wound bed. (Active)  Dressing Type ABD;Barrier Film (skin prep);Gauze (Comment);Moist to dry 08/09/2017 11:40 AM  Dressing Clean;Dry;Intact 08/09/2017 11:40 AM  Dressing Change Frequency Daily 08/09/2017 11:40 AM  State of Healing Non-healing 08/09/2017 11:40 AM  Site / Wound Assessment Pink;Brown;Yellow 08/09/2017 11:40 AM  % Wound base Red or Granulating 5% 08/09/2017 11:40 AM  % Wound base Yellow/Fibrinous Exudate 90% 08/09/2017 11:40 AM  % Wound base Black/Eschar 5% 08/09/2017 11:40 AM  % Wound base Other/Granulation Tissue (Comment) 0% 08/09/2017 11:40 AM  Peri-wound Assessment Intact;Pink 08/09/2017 11:40 AM  Wound Length (cm) 7.6 cm 08/06/2017  9:36 AM  Wound Width (cm) 5.6 cm 08/06/2017  9:36 AM  Wound Depth (cm) 0.7 cm 08/06/2017  9:36 AM  Margins Unattached edges (unapproximated) 08/09/2017 11:40 AM  Drainage Amount Minimal 08/09/2017 11:40 AM  Drainage Description Serosanguineous 08/09/2017 11:40 AM  Treatment Debridement (Selective);Hydrotherapy (Pulse lavage);Packing (Saline gauze) 08/09/2017 11:40 AM   Santyl applied to wound bed prior to applying dressing.    Hydrotherapy Pulsed lavage therapy - wound location: L buttock Pulsed Lavage with Suction (psi): 12 psi Pulsed Lavage with Suction - Normal Saline Used: 1000 mL Pulsed Lavage Tip: Tip with splash shield Selective Debridement Selective Debridement - Location: L buttock Selective  Debridement - Tools Used: Forceps;Scalpel Selective Debridement - Tissue Removed: eschar   Wound Assessment and Plan  Wound Therapy - Assess/Plan/Recommendations Wound Therapy - Clinical Statement: Overall quality of tissue appears to be improving with bright yellow fatty appearing tissue most visible. Pt will benefit from continued hydrotherapy for selective removal of nonviable tissue and to promote wound bed healing.  Wound Therapy - Functional Problem List: Decreased mobility Factors Delaying/Impairing Wound Healing: Altered sensation;Diabetes Mellitus;Incontinence;Immobility;Multiple medical problems Hydrotherapy Plan: Debridement;Dressing change;Patient/family education;Pulsatile lavage with suction Wound Therapy - Frequency: 6X / week Wound Therapy - Follow Up Recommendations: Skilled nursing facility Wound Plan: See above  Wound Therapy Goals- Improve the function of patient's integumentary system by progressing the wound(s) through the phases of wound healing (inflammation - proliferation - remodeling) by: Decrease Necrotic Tissue to: 25% Decrease Necrotic Tissue - Progress: Progressing toward goal Increase Granulation Tissue to: 75% Increase Granulation Tissue - Progress: Progressing toward goal Goals/treatment plan/discharge plan were made with and agreed upon by patient/family: Yes Time For Goal Achievement: 7 days Wound Therapy - Potential for Goals: Good  Goals will be updated until maximal potential achieved or discharge criteria met.  Discharge criteria: when goals achieved, discharge from hospital, MD decision/surgical intervention, no progress towards goals, refusal/missing three consecutive treatments without notification or medical reason.  GP     Thelma Comp 08/09/2017, 11:49 AM   Rolinda Roan, PT, DPT Acute Rehabilitation Services Pager: [phone]

## 2017-08-10 LAB — BASIC METABOLIC PANEL
ANION GAP: 5 (ref 5–15)
BUN: <5 mg/dL — ABNORMAL LOW (ref 6–20)
CALCIUM: 7.8 mg/dL — AB (ref 8.9–10.3)
CHLORIDE: 110 mmol/L (ref 101–111)
CO2: 27 mmol/L (ref 22–32)
CREATININE: 0.72 mg/dL (ref 0.44–1.00)
GFR calc non Af Amer: >60 mL/min (ref >60)
Glucose, Bld: 127 mg/dL — ABNORMAL HIGH (ref 65–99)
Potassium: 3.9 mmol/L (ref 3.5–5.1)
SODIUM: 142 mmol/L (ref 135–145)

## 2017-08-10 LAB — MAGNESIUM: MAGNESIUM: 1.6 mg/dL — AB (ref 1.7–2.4)

## 2017-09-03 NOTE — Progress Notes (Signed)
Clinical Social Worker facilitated patient discharge including contacting patient family and facility to confirm patient discharge plans.  Clinical information faxed to facility and family agreeable with plan.  CSW arranged ambulance transport via [facility] to [facility] to call [phone] for report prior to discharge.  Clinical Social Worker will sign off for now as social work intervention is no longer needed. Please consult [place] again if new need arises.  Rhea Pink, MSW, [place]

## 2017-09-03 NOTE — Care Management Important Message (Signed)
Important Message  Patient Details  Name: Lori [name] MRN: [id number] Date of Birth: 03-23-1951   Medicare Important Message Given:  Yes    [name] May 08-28-17, 9:42 AM

## 2017-09-03 NOTE — Progress Notes (Addendum)
Order received to discharge patient back to [facility].  Report called to [facility].  PTAR called.

## 2017-09-03 NOTE — Progress Notes (Signed)
Patient not discharged yesterday for unknown reasons. I was not notified. She remains stable for d/c today. Spoke with Designer, multimedia.

## 2017-09-03 DEATH — deceased

## 2017-10-15 ENCOUNTER — Ambulatory Visit: Payer: Medicare Other | Admitting: Gynecologic Oncology

## 2018-01-16 IMAGING — CT CT ABD-PELV W/O CM
2 of 4 series · 15 of 46 positions shown, 17 images · non-contrast
Comparison: 05/15/2017

CLINICAL DATA: GI bleed. Melena. History of cervical cancer.
History of appendiceal cancer.

EXAM:
CT ABDOMEN AND PELVIS WITHOUT CONTRAST
TECHNIQUE: Multidetector CT imaging of the abdomen and pelvis was performed
following the standard protocol without IV contrast.

[Series 3: ap without · axial · non-contrast · 0.87mm/px · z∈[+756,+1191]mm · 12 of 97 slices shown, 14 images]
[im 5/97  soft-tissue]
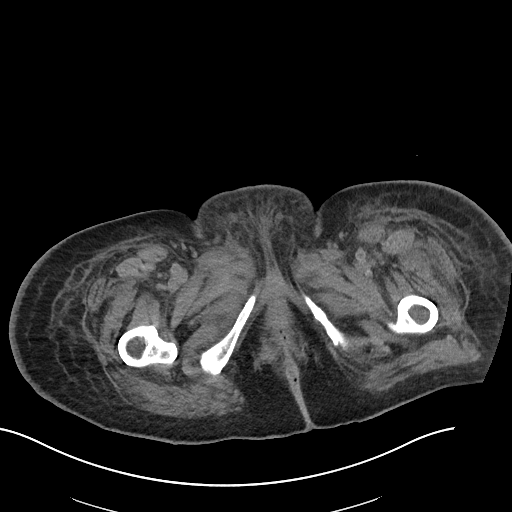
[im 5/97  bone]
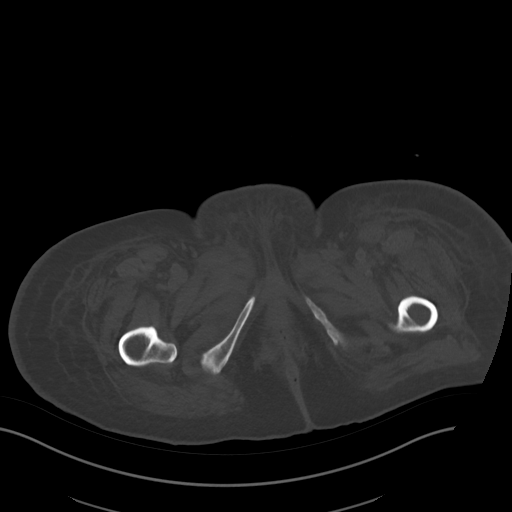
[im 14/97  soft-tissue]
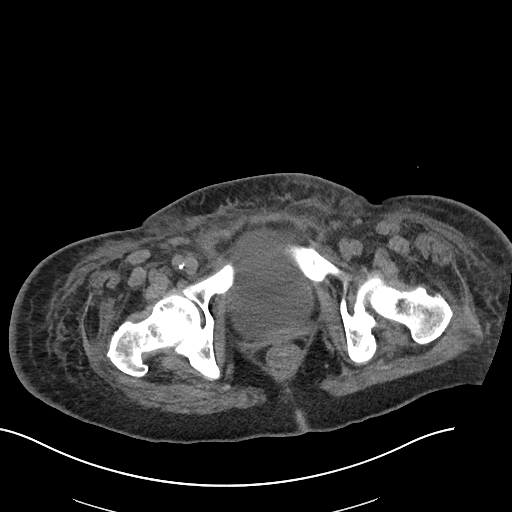
[im 22/97  soft-tissue]
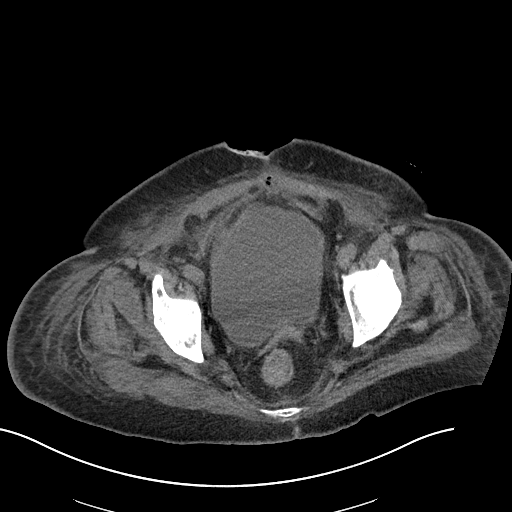
[im 31/97  soft-tissue]
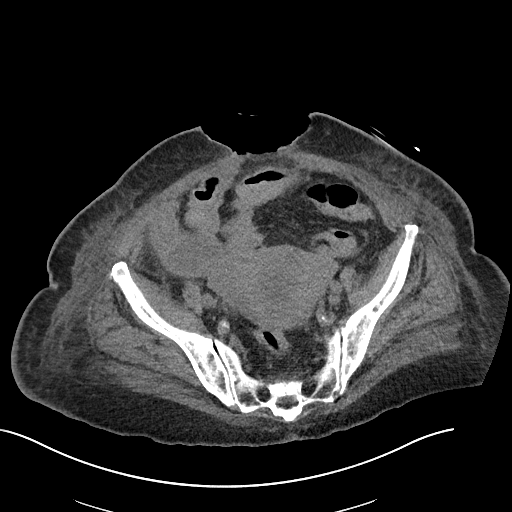
[im 35/97  soft-tissue]
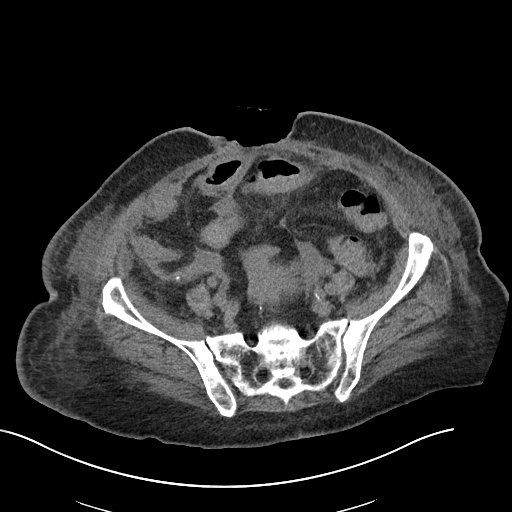
[im 44/97  soft-tissue]
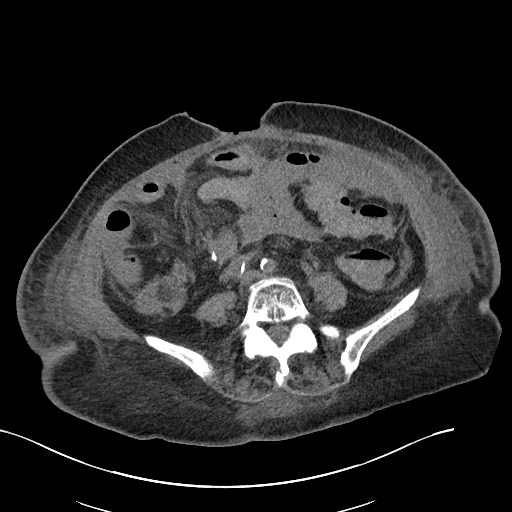
[im 53/97  soft-tissue]
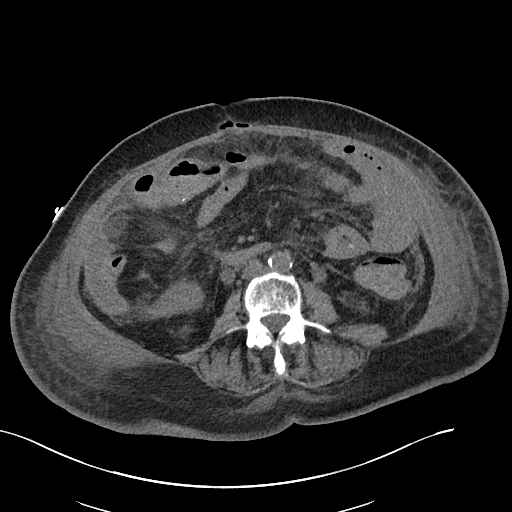
[im 62/97  soft-tissue]
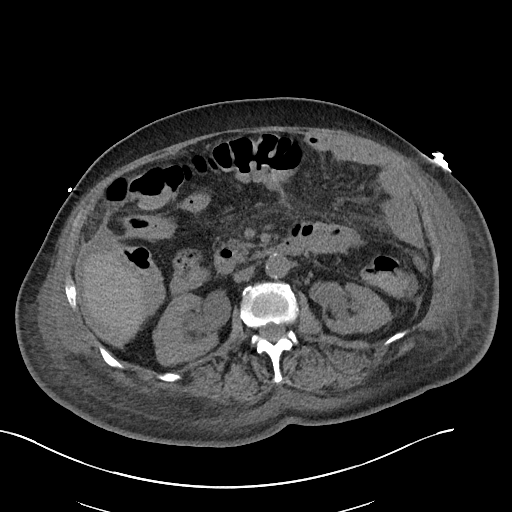
[im 66/97  soft-tissue]
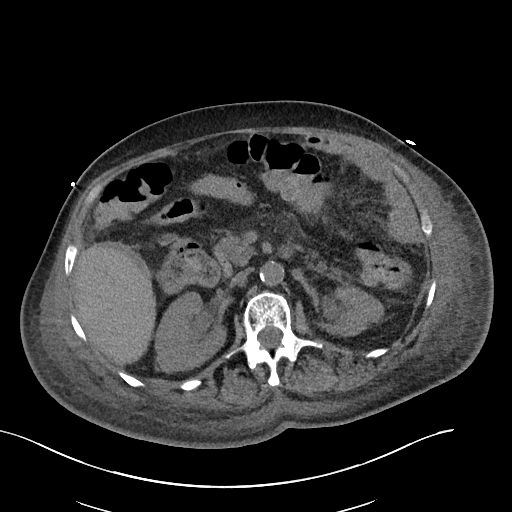
[im 66/97  bone]
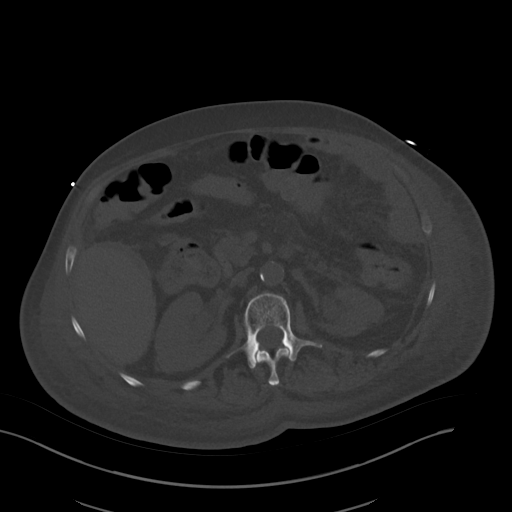
[im 75/97  soft-tissue]
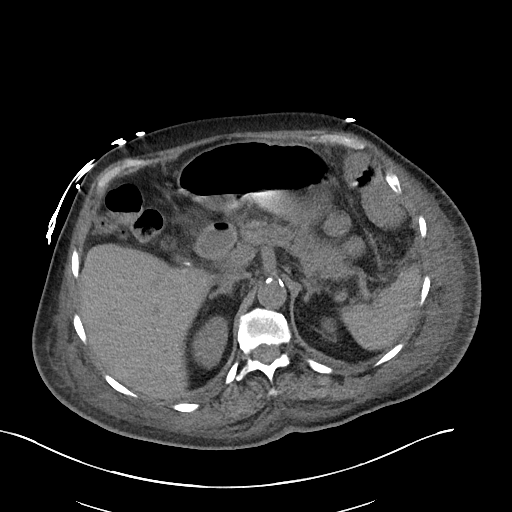
[im 83/97  soft-tissue]
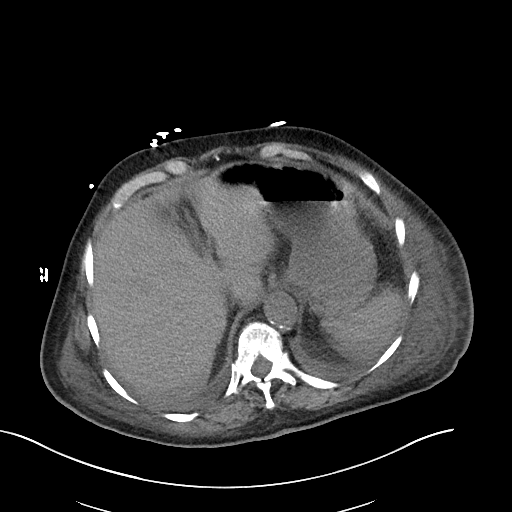
[im 92/97  soft-tissue]
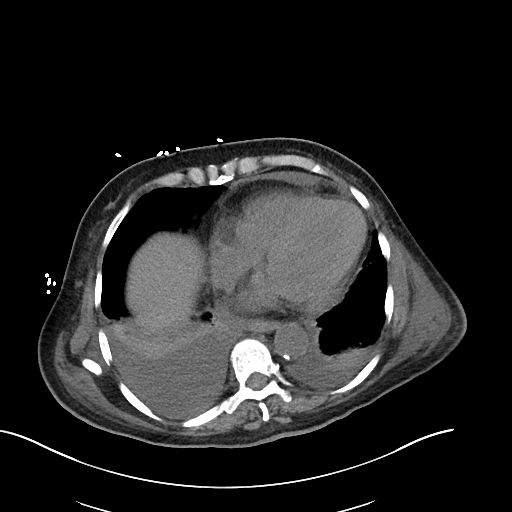

[Series 6: cor · coronal · 0.94mm/px · 3 of 96 slices shown]
[im 32/96  soft-tissue]
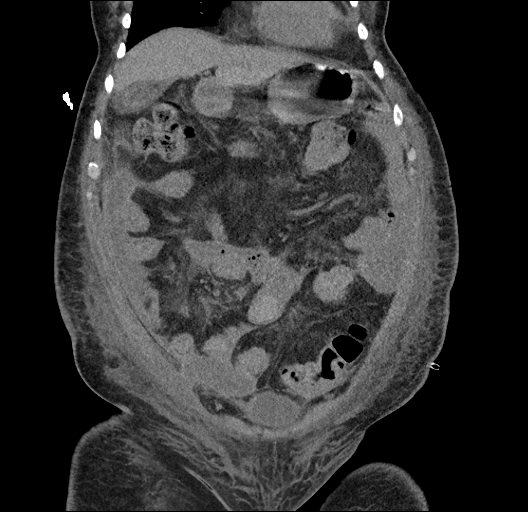
[im 43/96  soft-tissue]
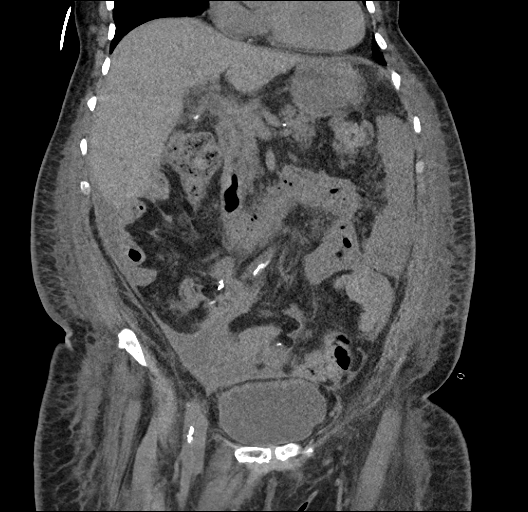
[im 53/96  soft-tissue]
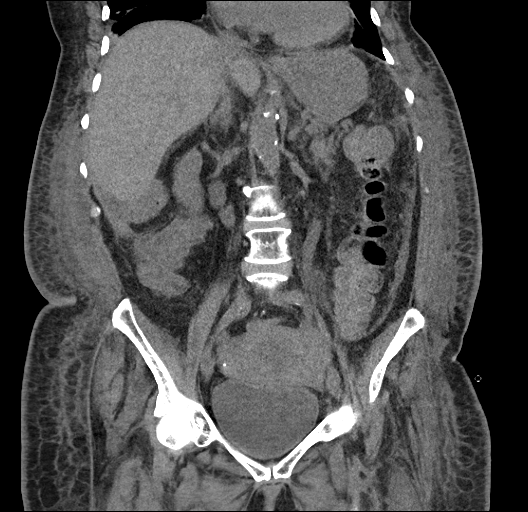

[15 of 46 positions shown; findings below may reference images not displayed]

FINDINGS: Lower chest: The heart is enlarged. Tiny pericardial effusion is
similar to prior. There is dependent atelectasis in the lower lungs
with moderate right and small left pleural effusions.

Hepatobiliary: No focal abnormality in the liver on this study
without intravenous contrast. Gallbladder is decompressed. The
possible tiny stone in the gallbladder neck. No intrahepatic or
extrahepatic biliary dilation.

Pancreas: No focal mass lesion. No dilatation of the main duct. No
intraparenchymal cyst. No peripancreatic edema.

Spleen: No splenomegaly. No focal mass lesion.

Adrenals/Urinary Tract: No adrenal nodule or mass. Left kidney is
atrophic as before. There is new bilateral mild
hydroureteronephrosis. The bladder is distended. Tiny air bubble in
the bladder lumen may be related to recent instrumentation although
bladder infection can cause this appearance.

Stomach/Bowel: Stomach is mildly distended and fluid-filled.
Duodenum unremarkable. No overt small bowel obstruction. The
terminal ileum is normal. Suture line in the region of the mid
transverse colon is compatible with the clinical history of
enterocolostomy from the mid ileum to the mid transverse colon. Left
colon unremarkable. There is edema a congestion of the small bowel
mesentery with small pockets of interloop mesenteric fluid.

Vascular/Lymphatic: There is abdominal aortic atherosclerosis
without aneurysm. There is no gastrohepatic or hepatoduodenal
ligament lymphadenopathy. No intraperitoneal or retroperitoneal
lymphadenopathy. No pelvic sidewall lymphadenopathy.

Reproductive: Uterus appears enlarged. Endometrium is thickened in
this patient with a history of cervical cancer. There is no adnexal
mass.

Other: Focal fluid collection is identified in the right lower
quadrant. This is difficult to assess without oral or intravenous
contrast material, but it appears to have a circumferential rim and
does not appear to represent bowel. There is another fluid
collection inferior to the liver tracking down the right peritoneal
cavity and into the right para colic gutter. This collection
measures 8.8 cm in craniocaudal length by 7.4 cm and AP dimension by
2.5 cm coronally. This also has a rim of increased attenuation
raising concern for abscess.

Musculoskeletal: Open midline wound identified with packing
material. Diffuse body wall edema evident. Stable appearance
sclerotic lesion right acetabulum. Bone windows reveal no worrisome
lytic or sclerotic osseous lesions.
IMPRESSION: 1. Patient is approximately 3 weeks out from diagnostic laparoscopy
with abscess drainage, repair of small bowel perforation, and entero
colostomy. On today's exam, there are 2 apparent fluid collections
identified, but are difficult to assess given the lack of
intravenous and oral contrast. One of these is just inferior to the
liver in tracks down the right paracolic gutter. The second is in
the right lower quadrant mesentery. Given the history of recent
surgery both could represent intraperitoneal abscess. However, with
the history of appendiceal carcinoma, pseudomyxoma peritonei is also
a consideration.
2. No evidence for bowel obstruction. No bowel wall thickening is
evident. There is fairly diffuse mesenteric edema and vascular
congestion with scattered collections of interloop mesenteric fluid.
The degree of these changes is more advanced than would be expected
3 weeks out from surgery.
3. Dependent atelectasis in both lower lungs with moderate right and
small left pleural effusions.
4. Tiny air bubble in the urinary bladder may be from recent
instrumentation, but bladder infection can have this appearance.
5. Thickening of the endometrial stripe in this patient with history
of cervical cancer.
